# Patient Record
Sex: Female | Born: 1950 | Race: Black or African American | Hispanic: No | Marital: Single | Smoking: Never smoker
Health system: Southern US, Community
[De-identification: ages and names within clinical notes are randomized; demographics above are authoritative.]

## PROBLEM LIST (undated history)

## (undated) DIAGNOSIS — C50919 Malignant neoplasm of unspecified site of unspecified female breast: Secondary | ICD-10-CM

## (undated) DIAGNOSIS — K219 Gastro-esophageal reflux disease without esophagitis: Secondary | ICD-10-CM

## (undated) DIAGNOSIS — M25471 Effusion, right ankle: Secondary | ICD-10-CM

## (undated) DIAGNOSIS — R32 Unspecified urinary incontinence: Secondary | ICD-10-CM

## (undated) DIAGNOSIS — M549 Dorsalgia, unspecified: Secondary | ICD-10-CM

## (undated) DIAGNOSIS — I00 Rheumatic fever without heart involvement: Secondary | ICD-10-CM

## (undated) DIAGNOSIS — IMO0002 Reserved for concepts with insufficient information to code with codable children: Secondary | ICD-10-CM

## (undated) DIAGNOSIS — Z8711 Personal history of peptic ulcer disease: Secondary | ICD-10-CM

## (undated) DIAGNOSIS — K5792 Diverticulitis of intestine, part unspecified, without perforation or abscess without bleeding: Secondary | ICD-10-CM

## (undated) DIAGNOSIS — M25472 Effusion, left ankle: Secondary | ICD-10-CM

## (undated) DIAGNOSIS — N189 Chronic kidney disease, unspecified: Secondary | ICD-10-CM

## (undated) DIAGNOSIS — K259 Gastric ulcer, unspecified as acute or chronic, without hemorrhage or perforation: Secondary | ICD-10-CM

## (undated) DIAGNOSIS — N390 Urinary tract infection, site not specified: Secondary | ICD-10-CM

## (undated) DIAGNOSIS — IMO0001 Reserved for inherently not codable concepts without codable children: Secondary | ICD-10-CM

## (undated) DIAGNOSIS — R55 Syncope and collapse: Secondary | ICD-10-CM

## (undated) DIAGNOSIS — M1990 Unspecified osteoarthritis, unspecified site: Secondary | ICD-10-CM

## (undated) DIAGNOSIS — R011 Cardiac murmur, unspecified: Secondary | ICD-10-CM

## (undated) DIAGNOSIS — K759 Inflammatory liver disease, unspecified: Secondary | ICD-10-CM

## (undated) DIAGNOSIS — Z803 Family history of malignant neoplasm of breast: Secondary | ICD-10-CM

## (undated) DIAGNOSIS — K449 Diaphragmatic hernia without obstruction or gangrene: Secondary | ICD-10-CM

## (undated) DIAGNOSIS — Z923 Personal history of irradiation: Secondary | ICD-10-CM

## (undated) DIAGNOSIS — B019 Varicella without complication: Secondary | ICD-10-CM

## (undated) DIAGNOSIS — K2970 Gastritis, unspecified, without bleeding: Secondary | ICD-10-CM

## (undated) DIAGNOSIS — M069 Rheumatoid arthritis, unspecified: Secondary | ICD-10-CM

## (undated) DIAGNOSIS — I10 Essential (primary) hypertension: Secondary | ICD-10-CM

## (undated) DIAGNOSIS — Z5189 Encounter for other specified aftercare: Secondary | ICD-10-CM

## (undated) DIAGNOSIS — M329 Systemic lupus erythematosus, unspecified: Secondary | ICD-10-CM

## (undated) DIAGNOSIS — F32A Depression, unspecified: Secondary | ICD-10-CM

## (undated) DIAGNOSIS — H269 Unspecified cataract: Secondary | ICD-10-CM

## (undated) DIAGNOSIS — C189 Malignant neoplasm of colon, unspecified: Secondary | ICD-10-CM

## (undated) DIAGNOSIS — T7840XA Allergy, unspecified, initial encounter: Secondary | ICD-10-CM

## (undated) DIAGNOSIS — K829 Disease of gallbladder, unspecified: Secondary | ICD-10-CM

## (undated) DIAGNOSIS — F419 Anxiety disorder, unspecified: Secondary | ICD-10-CM

## (undated) DIAGNOSIS — M2550 Pain in unspecified joint: Secondary | ICD-10-CM

## (undated) DIAGNOSIS — R002 Palpitations: Secondary | ICD-10-CM

## (undated) DIAGNOSIS — E739 Lactose intolerance, unspecified: Secondary | ICD-10-CM

## (undated) DIAGNOSIS — N289 Disorder of kidney and ureter, unspecified: Secondary | ICD-10-CM

## (undated) HISTORY — DX: Encounter for other specified aftercare: Z51.89

## (undated) HISTORY — DX: Diverticulitis of intestine, part unspecified, without perforation or abscess without bleeding: K57.92

## (undated) HISTORY — DX: Depression, unspecified: F32.A

## (undated) HISTORY — DX: Palpitations: R00.2

## (undated) HISTORY — DX: Disease of gallbladder, unspecified: K82.9

## (undated) HISTORY — DX: Rheumatoid arthritis, unspecified: M06.9

## (undated) HISTORY — DX: Rheumatic fever without heart involvement: I00

## (undated) HISTORY — DX: Allergy, unspecified, initial encounter: T78.40XA

## (undated) HISTORY — DX: Systemic lupus erythematosus, unspecified: M32.9

## (undated) HISTORY — PX: JOINT REPLACEMENT: SHX530

## (undated) HISTORY — DX: Unspecified urinary incontinence: R32

## (undated) HISTORY — PX: APPENDECTOMY: SHX54

## (undated) HISTORY — PX: ABDOMINAL HYSTERECTOMY: SHX81

## (undated) HISTORY — PX: BREAST SURGERY: SHX581

## (undated) HISTORY — DX: Pain in unspecified joint: M25.50

## (undated) HISTORY — DX: Effusion, left ankle: M25.472

## (undated) HISTORY — PX: TONSILLECTOMY: SUR1361

## (undated) HISTORY — DX: Effusion, right ankle: M25.471

## (undated) HISTORY — DX: Urinary tract infection, site not specified: N39.0

## (undated) HISTORY — DX: Dorsalgia, unspecified: M54.9

## (undated) HISTORY — DX: Varicella without complication: B01.9

## (undated) HISTORY — DX: Lactose intolerance, unspecified: E73.9

## (undated) HISTORY — PX: CHOLECYSTECTOMY: SHX55

## (undated) HISTORY — PX: TUBAL LIGATION: SHX77

## (undated) HISTORY — DX: Family history of malignant neoplasm of breast: Z80.3

## (undated) HISTORY — PX: REPLACEMENT TOTAL KNEE: SUR1224

## (undated) HISTORY — PX: BREAST LUMPECTOMY: SHX2

## (undated) HISTORY — DX: Malignant neoplasm of colon, unspecified: C18.9

## (undated) HISTORY — DX: Anxiety disorder, unspecified: F41.9

## (undated) HISTORY — DX: Gastric ulcer, unspecified as acute or chronic, without hemorrhage or perforation: K25.9

## (undated) HISTORY — DX: Unspecified cataract: H26.9

## (undated) HISTORY — DX: Malignant neoplasm of unspecified site of unspecified female breast: C50.919

## (undated) HISTORY — DX: Personal history of peptic ulcer disease: Z87.11

---

## 1996-09-20 HISTORY — PX: BREAST EXCISIONAL BIOPSY: SUR124

## 1998-11-08 ENCOUNTER — Emergency Department (HOSPITAL_COMMUNITY): Admission: EM | Admit: 1998-11-08 | Discharge: 1998-11-08 | Payer: Self-pay | Admitting: Emergency Medicine

## 1999-02-05 ENCOUNTER — Encounter: Payer: Self-pay | Admitting: Emergency Medicine

## 1999-02-05 ENCOUNTER — Emergency Department (HOSPITAL_COMMUNITY): Admission: EM | Admit: 1999-02-05 | Discharge: 1999-02-05 | Payer: Self-pay | Admitting: Emergency Medicine

## 1999-05-19 ENCOUNTER — Emergency Department (HOSPITAL_COMMUNITY): Admission: EM | Admit: 1999-05-19 | Discharge: 1999-05-19 | Payer: Self-pay | Admitting: Emergency Medicine

## 1999-06-25 ENCOUNTER — Emergency Department (HOSPITAL_COMMUNITY): Admission: EM | Admit: 1999-06-25 | Discharge: 1999-06-25 | Payer: Self-pay | Admitting: Emergency Medicine

## 1999-06-25 ENCOUNTER — Encounter: Payer: Self-pay | Admitting: Emergency Medicine

## 1999-07-28 ENCOUNTER — Ambulatory Visit (HOSPITAL_COMMUNITY): Admission: RE | Admit: 1999-07-28 | Discharge: 1999-07-28 | Payer: Self-pay | Admitting: Occupational Medicine

## 1999-07-28 ENCOUNTER — Encounter: Payer: Self-pay | Admitting: Occupational Medicine

## 1999-08-11 ENCOUNTER — Encounter: Admission: RE | Admit: 1999-08-11 | Discharge: 1999-11-09 | Payer: Self-pay | Admitting: *Deleted

## 1999-08-27 ENCOUNTER — Inpatient Hospital Stay (HOSPITAL_COMMUNITY): Admission: AD | Admit: 1999-08-27 | Discharge: 1999-08-27 | Payer: Self-pay | Admitting: Obstetrics & Gynecology

## 2000-01-13 ENCOUNTER — Emergency Department (HOSPITAL_COMMUNITY): Admission: EM | Admit: 2000-01-13 | Discharge: 2000-01-13 | Payer: Self-pay | Admitting: Emergency Medicine

## 2000-02-16 ENCOUNTER — Encounter: Payer: Self-pay | Admitting: Internal Medicine

## 2000-02-16 ENCOUNTER — Emergency Department (HOSPITAL_COMMUNITY): Admission: EM | Admit: 2000-02-16 | Discharge: 2000-02-16 | Payer: Self-pay | Admitting: Internal Medicine

## 2000-07-19 ENCOUNTER — Encounter: Payer: Self-pay | Admitting: Emergency Medicine

## 2000-07-19 ENCOUNTER — Emergency Department (HOSPITAL_COMMUNITY): Admission: EM | Admit: 2000-07-19 | Discharge: 2000-07-19 | Payer: Self-pay | Admitting: Emergency Medicine

## 2000-09-01 ENCOUNTER — Ambulatory Visit (HOSPITAL_COMMUNITY): Admission: RE | Admit: 2000-09-01 | Discharge: 2000-09-01 | Payer: Self-pay | Admitting: Family Medicine

## 2000-09-01 ENCOUNTER — Encounter: Payer: Self-pay | Admitting: Family Medicine

## 2001-07-12 ENCOUNTER — Ambulatory Visit (HOSPITAL_COMMUNITY): Admission: RE | Admit: 2001-07-12 | Discharge: 2001-07-12 | Payer: Self-pay | Admitting: Internal Medicine

## 2001-07-12 ENCOUNTER — Encounter: Payer: Self-pay | Admitting: Internal Medicine

## 2001-09-20 DIAGNOSIS — C189 Malignant neoplasm of colon, unspecified: Secondary | ICD-10-CM

## 2001-09-20 HISTORY — DX: Malignant neoplasm of colon, unspecified: C18.9

## 2003-05-07 ENCOUNTER — Emergency Department (HOSPITAL_COMMUNITY): Admission: EM | Admit: 2003-05-07 | Discharge: 2003-05-07 | Payer: Self-pay | Admitting: Emergency Medicine

## 2003-05-07 ENCOUNTER — Encounter: Payer: Self-pay | Admitting: Emergency Medicine

## 2003-07-30 ENCOUNTER — Encounter: Admission: RE | Admit: 2003-07-30 | Discharge: 2003-10-28 | Payer: Self-pay | Admitting: Occupational Medicine

## 2003-09-16 ENCOUNTER — Ambulatory Visit (HOSPITAL_COMMUNITY): Admission: RE | Admit: 2003-09-16 | Discharge: 2003-09-16 | Payer: Self-pay | Admitting: Orthopedic Surgery

## 2003-09-16 ENCOUNTER — Ambulatory Visit (HOSPITAL_BASED_OUTPATIENT_CLINIC_OR_DEPARTMENT_OTHER): Admission: RE | Admit: 2003-09-16 | Discharge: 2003-09-16 | Payer: Self-pay | Admitting: Orthopedic Surgery

## 2003-11-13 ENCOUNTER — Other Ambulatory Visit: Admission: RE | Admit: 2003-11-13 | Discharge: 2003-11-13 | Payer: Self-pay | Admitting: Obstetrics and Gynecology

## 2003-12-01 ENCOUNTER — Emergency Department (HOSPITAL_COMMUNITY): Admission: EM | Admit: 2003-12-01 | Discharge: 2003-12-01 | Payer: Self-pay | Admitting: Emergency Medicine

## 2004-08-06 ENCOUNTER — Emergency Department (HOSPITAL_COMMUNITY): Admission: EM | Admit: 2004-08-06 | Discharge: 2004-08-06 | Payer: Self-pay | Admitting: Emergency Medicine

## 2004-08-08 ENCOUNTER — Emergency Department (HOSPITAL_COMMUNITY): Admission: EM | Admit: 2004-08-08 | Discharge: 2004-08-08 | Payer: Self-pay | Admitting: Emergency Medicine

## 2004-10-06 ENCOUNTER — Emergency Department (HOSPITAL_COMMUNITY): Admission: EM | Admit: 2004-10-06 | Discharge: 2004-10-06 | Payer: Self-pay | Admitting: Emergency Medicine

## 2004-11-05 ENCOUNTER — Emergency Department (HOSPITAL_COMMUNITY): Admission: EM | Admit: 2004-11-05 | Discharge: 2004-11-05 | Payer: Self-pay | Admitting: Emergency Medicine

## 2005-01-27 ENCOUNTER — Inpatient Hospital Stay (HOSPITAL_COMMUNITY): Admission: RE | Admit: 2005-01-27 | Discharge: 2005-02-01 | Payer: Self-pay | Admitting: Orthopedic Surgery

## 2005-10-29 ENCOUNTER — Inpatient Hospital Stay (HOSPITAL_COMMUNITY): Admission: AD | Admit: 2005-10-29 | Discharge: 2005-10-29 | Payer: Self-pay | Admitting: *Deleted

## 2005-11-25 ENCOUNTER — Encounter: Payer: Self-pay | Admitting: Family Medicine

## 2005-11-25 ENCOUNTER — Ambulatory Visit: Payer: Self-pay | Admitting: Family Medicine

## 2005-12-24 ENCOUNTER — Ambulatory Visit (HOSPITAL_COMMUNITY): Admission: RE | Admit: 2005-12-24 | Discharge: 2005-12-24 | Payer: Self-pay | Admitting: *Deleted

## 2007-03-04 ENCOUNTER — Emergency Department (HOSPITAL_COMMUNITY): Admission: EM | Admit: 2007-03-04 | Discharge: 2007-03-04 | Payer: Self-pay | Admitting: Family Medicine

## 2007-06-28 ENCOUNTER — Emergency Department (HOSPITAL_COMMUNITY): Admission: EM | Admit: 2007-06-28 | Discharge: 2007-06-28 | Payer: Self-pay | Admitting: Emergency Medicine

## 2007-09-21 DIAGNOSIS — C50919 Malignant neoplasm of unspecified site of unspecified female breast: Secondary | ICD-10-CM

## 2007-09-21 DIAGNOSIS — Z923 Personal history of irradiation: Secondary | ICD-10-CM

## 2007-09-21 HISTORY — DX: Malignant neoplasm of unspecified site of unspecified female breast: C50.919

## 2007-09-21 HISTORY — DX: Personal history of irradiation: Z92.3

## 2007-11-02 ENCOUNTER — Encounter (INDEPENDENT_AMBULATORY_CARE_PROVIDER_SITE_OTHER): Payer: Self-pay | Admitting: Nurse Practitioner

## 2007-11-03 ENCOUNTER — Ambulatory Visit: Payer: Self-pay | Admitting: Nurse Practitioner

## 2007-11-03 DIAGNOSIS — R109 Unspecified abdominal pain: Secondary | ICD-10-CM | POA: Insufficient documentation

## 2007-11-03 DIAGNOSIS — I10 Essential (primary) hypertension: Secondary | ICD-10-CM | POA: Insufficient documentation

## 2007-11-03 DIAGNOSIS — I00 Rheumatic fever without heart involvement: Secondary | ICD-10-CM | POA: Insufficient documentation

## 2007-11-03 DIAGNOSIS — M25559 Pain in unspecified hip: Secondary | ICD-10-CM | POA: Insufficient documentation

## 2007-11-03 DIAGNOSIS — M329 Systemic lupus erythematosus, unspecified: Secondary | ICD-10-CM | POA: Insufficient documentation

## 2007-11-03 HISTORY — DX: Unspecified abdominal pain: R10.9

## 2007-11-16 ENCOUNTER — Telehealth (INDEPENDENT_AMBULATORY_CARE_PROVIDER_SITE_OTHER): Payer: Self-pay | Admitting: Nurse Practitioner

## 2007-11-17 ENCOUNTER — Ambulatory Visit: Payer: Self-pay | Admitting: *Deleted

## 2007-11-24 ENCOUNTER — Ambulatory Visit: Payer: Self-pay | Admitting: Nurse Practitioner

## 2007-11-24 LAB — CONVERTED CEMR LAB
Bilirubin Urine: NEGATIVE
Chlamydia, DNA Probe: NEGATIVE
Cholesterol: 143 mg/dL (ref 0–200)
GC Probe Amp, Genital: NEGATIVE
Glucose, Urine, Semiquant: NEGATIVE
HDL: 55 mg/dL (ref >39)
Ketones, urine, test strip: NEGATIVE
LDL Cholesterol: 71 mg/dL (ref 0–99)
Nitrite: NEGATIVE
Pap Smear: NEGATIVE
Protein, U semiquant: NEGATIVE
Specific Gravity, Urine: 1.01
Total CHOL/HDL Ratio: 2.6
Triglycerides: 86 mg/dL (ref <150)
Urobilinogen, UA: 0.2
VLDL: 17 mg/dL (ref 0–40)
pH: 7

## 2007-11-29 ENCOUNTER — Telehealth (INDEPENDENT_AMBULATORY_CARE_PROVIDER_SITE_OTHER): Payer: Self-pay | Admitting: Nurse Practitioner

## 2007-11-29 ENCOUNTER — Ambulatory Visit (HOSPITAL_COMMUNITY): Admission: RE | Admit: 2007-11-29 | Discharge: 2007-11-29 | Payer: Self-pay | Admitting: Internal Medicine

## 2007-11-30 ENCOUNTER — Ambulatory Visit (HOSPITAL_COMMUNITY): Admission: RE | Admit: 2007-11-30 | Discharge: 2007-11-30 | Payer: Self-pay | Admitting: Internal Medicine

## 2007-12-01 ENCOUNTER — Encounter (INDEPENDENT_AMBULATORY_CARE_PROVIDER_SITE_OTHER): Payer: Self-pay | Admitting: Nurse Practitioner

## 2007-12-04 ENCOUNTER — Observation Stay (HOSPITAL_COMMUNITY): Admission: EM | Admit: 2007-12-04 | Discharge: 2007-12-05 | Payer: Self-pay | Admitting: Family Medicine

## 2007-12-07 ENCOUNTER — Encounter: Admission: RE | Admit: 2007-12-07 | Discharge: 2007-12-07 | Payer: Self-pay | Admitting: Internal Medicine

## 2007-12-14 ENCOUNTER — Ambulatory Visit: Payer: Self-pay | Admitting: Nurse Practitioner

## 2007-12-14 DIAGNOSIS — K5900 Constipation, unspecified: Secondary | ICD-10-CM | POA: Insufficient documentation

## 2007-12-14 DIAGNOSIS — K449 Diaphragmatic hernia without obstruction or gangrene: Secondary | ICD-10-CM | POA: Insufficient documentation

## 2007-12-14 DIAGNOSIS — K5790 Diverticulosis of intestine, part unspecified, without perforation or abscess without bleeding: Secondary | ICD-10-CM | POA: Insufficient documentation

## 2007-12-14 DIAGNOSIS — R920 Mammographic microcalcification found on diagnostic imaging of breast: Secondary | ICD-10-CM | POA: Insufficient documentation

## 2007-12-14 DIAGNOSIS — K5730 Diverticulosis of large intestine without perforation or abscess without bleeding: Secondary | ICD-10-CM | POA: Insufficient documentation

## 2007-12-14 LAB — CONVERTED CEMR LAB
Bilirubin Urine: NEGATIVE
Glucose, Urine, Semiquant: NEGATIVE
Ketones, urine, test strip: NEGATIVE
Nitrite: NEGATIVE
Protein, U semiquant: NEGATIVE
Specific Gravity, Urine: 1.02
Urobilinogen, UA: 0.2
pH: 6

## 2007-12-15 ENCOUNTER — Encounter (INDEPENDENT_AMBULATORY_CARE_PROVIDER_SITE_OTHER): Payer: Self-pay | Admitting: Nurse Practitioner

## 2008-01-08 ENCOUNTER — Encounter (INDEPENDENT_AMBULATORY_CARE_PROVIDER_SITE_OTHER): Payer: Self-pay | Admitting: Diagnostic Radiology

## 2008-01-08 ENCOUNTER — Encounter: Admission: RE | Admit: 2008-01-08 | Discharge: 2008-01-08 | Payer: Self-pay | Admitting: Internal Medicine

## 2008-01-11 ENCOUNTER — Ambulatory Visit: Payer: Self-pay | Admitting: Nurse Practitioner

## 2008-01-11 DIAGNOSIS — C50411 Malignant neoplasm of upper-outer quadrant of right female breast: Secondary | ICD-10-CM | POA: Insufficient documentation

## 2008-01-11 DIAGNOSIS — Z170 Estrogen receptor positive status [ER+]: Secondary | ICD-10-CM | POA: Insufficient documentation

## 2008-01-11 LAB — CONVERTED CEMR LAB
Bilirubin Urine: NEGATIVE
Glucose, Urine, Semiquant: NEGATIVE
Ketones, urine, test strip: NEGATIVE
Nitrite: NEGATIVE
Protein, U semiquant: NEGATIVE
Specific Gravity, Urine: 1.01
Urobilinogen, UA: 0.2
WBC Urine, dipstick: NEGATIVE
pH: 6

## 2008-01-12 ENCOUNTER — Encounter (INDEPENDENT_AMBULATORY_CARE_PROVIDER_SITE_OTHER): Payer: Self-pay | Admitting: Nurse Practitioner

## 2008-01-18 ENCOUNTER — Ambulatory Visit: Payer: Self-pay | Admitting: Nurse Practitioner

## 2008-01-19 ENCOUNTER — Encounter (INDEPENDENT_AMBULATORY_CARE_PROVIDER_SITE_OTHER): Payer: Self-pay | Admitting: Nurse Practitioner

## 2008-01-26 ENCOUNTER — Encounter (INDEPENDENT_AMBULATORY_CARE_PROVIDER_SITE_OTHER): Payer: Self-pay | Admitting: Nurse Practitioner

## 2008-01-26 ENCOUNTER — Encounter: Admission: RE | Admit: 2008-01-26 | Discharge: 2008-01-26 | Payer: Self-pay | Admitting: Internal Medicine

## 2008-01-26 ENCOUNTER — Ambulatory Visit: Payer: Self-pay | Admitting: Oncology

## 2008-02-16 ENCOUNTER — Encounter (INDEPENDENT_AMBULATORY_CARE_PROVIDER_SITE_OTHER): Payer: Self-pay | Admitting: Nurse Practitioner

## 2008-02-26 ENCOUNTER — Telehealth (INDEPENDENT_AMBULATORY_CARE_PROVIDER_SITE_OTHER): Payer: Self-pay | Admitting: Nurse Practitioner

## 2008-02-29 ENCOUNTER — Ambulatory Visit: Payer: Self-pay | Admitting: Nurse Practitioner

## 2008-02-29 LAB — CONVERTED CEMR LAB
Bilirubin Urine: NEGATIVE
Blood in Urine, dipstick: NEGATIVE
Glucose, Urine, Semiquant: NEGATIVE
Ketones, urine, test strip: NEGATIVE
Nitrite: NEGATIVE
Protein, U semiquant: NEGATIVE
Specific Gravity, Urine: 1.01
Urobilinogen, UA: 0.2
pH: 7

## 2008-03-06 ENCOUNTER — Telehealth (INDEPENDENT_AMBULATORY_CARE_PROVIDER_SITE_OTHER): Payer: Self-pay | Admitting: Nurse Practitioner

## 2008-03-20 ENCOUNTER — Encounter: Admission: RE | Admit: 2008-03-20 | Discharge: 2008-03-20 | Payer: Self-pay | Admitting: General Surgery

## 2008-03-20 ENCOUNTER — Ambulatory Visit (HOSPITAL_BASED_OUTPATIENT_CLINIC_OR_DEPARTMENT_OTHER): Admission: RE | Admit: 2008-03-20 | Discharge: 2008-03-20 | Payer: Self-pay | Admitting: General Surgery

## 2008-03-20 ENCOUNTER — Encounter (HOSPITAL_BASED_OUTPATIENT_CLINIC_OR_DEPARTMENT_OTHER): Payer: Self-pay | Admitting: General Surgery

## 2008-03-21 ENCOUNTER — Ambulatory Visit: Payer: Self-pay | Admitting: Nurse Practitioner

## 2008-03-22 ENCOUNTER — Encounter (INDEPENDENT_AMBULATORY_CARE_PROVIDER_SITE_OTHER): Payer: Self-pay | Admitting: Nurse Practitioner

## 2008-03-26 LAB — CONVERTED CEMR LAB
ANA Titer 1: 1:40 {titer} — ABNORMAL HIGH
Anti Nuclear Antibody(ANA): POSITIVE — AB
Basophils Absolute: 0 10*3/uL (ref 0.0–0.1)
Basophils Relative: 0 % (ref 0–1)
CRP: 0.7 mg/dL — ABNORMAL HIGH (ref <0.6)
Eosinophils Absolute: 0 10*3/uL (ref 0.0–0.7)
Eosinophils Relative: 0 % (ref 0–5)
HCT: 39.7 % (ref 36.0–46.0)
Hemoglobin: 12.6 g/dL (ref 12.0–15.0)
Lymphocytes Relative: 14 % (ref 12–46)
Lymphs Abs: 1.8 10*3/uL (ref 0.7–4.0)
MCHC: 31.7 g/dL (ref 30.0–36.0)
MCV: 94.5 fL (ref 78.0–100.0)
Monocytes Absolute: 1.3 10*3/uL — ABNORMAL HIGH (ref 0.1–1.0)
Monocytes Relative: 10 % (ref 3–12)
Neutro Abs: 10 10*3/uL — ABNORMAL HIGH (ref 1.7–7.7)
Neutrophils Relative %: 76 % (ref 43–77)
Platelets: 219 10*3/uL (ref 150–400)
RBC: 4.2 M/uL (ref 3.87–5.11)
RDW: 13.9 % (ref 11.5–15.5)
Rhuematoid fact SerPl-aCnc: <20 intl units/mL (ref 0–20)
Sed Rate: 12 mm/hr (ref 0–22)
WBC: 13.1 10*3/uL — ABNORMAL HIGH (ref 4.0–10.5)

## 2008-04-01 ENCOUNTER — Encounter (INDEPENDENT_AMBULATORY_CARE_PROVIDER_SITE_OTHER): Payer: Self-pay | Admitting: *Deleted

## 2008-04-05 ENCOUNTER — Ambulatory Visit: Payer: Self-pay | Admitting: Oncology

## 2008-04-11 ENCOUNTER — Encounter (INDEPENDENT_AMBULATORY_CARE_PROVIDER_SITE_OTHER): Payer: Self-pay | Admitting: Nurse Practitioner

## 2008-04-12 ENCOUNTER — Telehealth (INDEPENDENT_AMBULATORY_CARE_PROVIDER_SITE_OTHER): Payer: Self-pay | Admitting: Nurse Practitioner

## 2008-04-17 ENCOUNTER — Ambulatory Visit (HOSPITAL_BASED_OUTPATIENT_CLINIC_OR_DEPARTMENT_OTHER): Admission: RE | Admit: 2008-04-17 | Discharge: 2008-04-17 | Payer: Self-pay | Admitting: General Surgery

## 2008-04-17 ENCOUNTER — Encounter (HOSPITAL_BASED_OUTPATIENT_CLINIC_OR_DEPARTMENT_OTHER): Payer: Self-pay | Admitting: General Surgery

## 2008-04-24 ENCOUNTER — Encounter (INDEPENDENT_AMBULATORY_CARE_PROVIDER_SITE_OTHER): Payer: Self-pay | Admitting: Nurse Practitioner

## 2008-04-24 LAB — CBC WITH DIFFERENTIAL/PLATELET
BASO%: 0.7 % (ref 0.0–2.0)
Basophils Absolute: 0 10*3/uL (ref 0.0–0.1)
EOS%: 3.3 % (ref 0.0–7.0)
Eosinophils Absolute: 0.1 10*3/uL (ref 0.0–0.5)
HCT: 39.9 % (ref 34.8–46.6)
HGB: 13.6 g/dL (ref 11.6–15.9)
LYMPH%: 57.3 % — ABNORMAL HIGH (ref 14.0–48.0)
MCH: 31 pg (ref 26.0–34.0)
MCHC: 34.2 g/dL (ref 32.0–36.0)
MCV: 90.7 fL (ref 81.0–101.0)
MONO#: 0.2 10*3/uL (ref 0.1–0.9)
MONO%: 6 % (ref 0.0–13.0)
NEUT#: 1.2 10*3/uL — ABNORMAL LOW (ref 1.5–6.5)
NEUT%: 32.7 % — ABNORMAL LOW (ref 39.6–76.8)
Platelets: 202 10*3/uL (ref 145–400)
RBC: 4.4 10*6/uL (ref 3.70–5.32)
RDW: 13.6 % (ref 11.3–14.5)
WBC: 3.8 10*3/uL — ABNORMAL LOW (ref 3.9–10.0)
lymph#: 2.1 10*3/uL (ref 0.9–3.3)

## 2008-04-25 LAB — COMPREHENSIVE METABOLIC PANEL
ALT: 28 U/L (ref 0–35)
AST: 19 U/L (ref 0–37)
Albumin: 4 g/dL (ref 3.5–5.2)
Alkaline Phosphatase: 93 U/L (ref 39–117)
BUN: 8 mg/dL (ref 6–23)
CO2: 25 mEq/L (ref 19–32)
Calcium: 9.4 mg/dL (ref 8.4–10.5)
Chloride: 104 mEq/L (ref 96–112)
Creatinine, Ser: 0.8 mg/dL (ref 0.40–1.20)
Glucose, Bld: 117 mg/dL — ABNORMAL HIGH (ref 70–99)
Potassium: 3.8 mEq/L (ref 3.5–5.3)
Sodium: 138 mEq/L (ref 135–145)
Total Bilirubin: 0.4 mg/dL (ref 0.3–1.2)
Total Protein: 7 g/dL (ref 6.0–8.3)

## 2008-04-25 LAB — VITAMIN D 25 HYDROXY (VIT D DEFICIENCY, FRACTURES): Vit D, 25-Hydroxy: 33 ng/mL (ref 30–89)

## 2008-04-25 LAB — CANCER ANTIGEN 27.29: CA 27.29: 15 U/mL (ref 0–39)

## 2008-04-25 LAB — LACTATE DEHYDROGENASE: LDH: 161 U/L (ref 94–250)

## 2008-04-30 ENCOUNTER — Encounter: Admission: RE | Admit: 2008-04-30 | Discharge: 2008-04-30 | Payer: Self-pay | Admitting: Oncology

## 2008-05-01 ENCOUNTER — Ambulatory Visit (HOSPITAL_COMMUNITY): Admission: RE | Admit: 2008-05-01 | Discharge: 2008-05-01 | Payer: Self-pay | Admitting: Oncology

## 2008-05-07 ENCOUNTER — Ambulatory Visit: Admission: RE | Admit: 2008-05-07 | Discharge: 2008-08-05 | Payer: Self-pay | Admitting: Radiation Oncology

## 2008-05-08 ENCOUNTER — Encounter (INDEPENDENT_AMBULATORY_CARE_PROVIDER_SITE_OTHER): Payer: Self-pay | Admitting: Nurse Practitioner

## 2008-05-14 ENCOUNTER — Encounter: Admission: RE | Admit: 2008-05-14 | Discharge: 2008-05-14 | Payer: Self-pay | Admitting: Radiation Oncology

## 2008-05-14 ENCOUNTER — Encounter (INDEPENDENT_AMBULATORY_CARE_PROVIDER_SITE_OTHER): Payer: Self-pay | Admitting: Family Medicine

## 2008-05-23 ENCOUNTER — Encounter: Admission: RE | Admit: 2008-05-23 | Discharge: 2008-05-23 | Payer: Self-pay | Admitting: General Surgery

## 2008-05-23 ENCOUNTER — Encounter (INDEPENDENT_AMBULATORY_CARE_PROVIDER_SITE_OTHER): Payer: Self-pay | Admitting: Diagnostic Radiology

## 2008-06-17 LAB — URINALYSIS, MICROSCOPIC - CHCC
Bilirubin (Urine): NEGATIVE
Glucose: NEGATIVE g/dL
Ketones: NEGATIVE mg/dL
Nitrite: NEGATIVE
Protein: <30 mg/dL
Specific Gravity, Urine: 1.015 (ref 1.003–1.035)
pH: 6 (ref 4.6–8.0)

## 2008-06-19 LAB — URINALYSIS, MICROSCOPIC - CHCC
Bilirubin (Urine): NEGATIVE
Glucose: NEGATIVE g/dL
Ketones: NEGATIVE mg/dL
Nitrite: NEGATIVE
Protein: <30 mg/dL
Specific Gravity, Urine: 1.015 (ref 1.003–1.035)
pH: 6 (ref 4.6–8.0)

## 2008-06-19 LAB — URINE CULTURE

## 2008-06-21 LAB — URINE CULTURE

## 2008-07-26 ENCOUNTER — Encounter (INDEPENDENT_AMBULATORY_CARE_PROVIDER_SITE_OTHER): Payer: Self-pay | Admitting: Nurse Practitioner

## 2008-08-22 ENCOUNTER — Encounter (INDEPENDENT_AMBULATORY_CARE_PROVIDER_SITE_OTHER): Payer: Self-pay | Admitting: Nurse Practitioner

## 2008-09-10 ENCOUNTER — Ambulatory Visit: Payer: Self-pay | Admitting: Oncology

## 2008-09-16 ENCOUNTER — Encounter (INDEPENDENT_AMBULATORY_CARE_PROVIDER_SITE_OTHER): Payer: Self-pay | Admitting: Nurse Practitioner

## 2008-09-16 LAB — CBC WITH DIFFERENTIAL/PLATELET
BASO%: 0.5 % (ref 0.0–2.0)
Basophils Absolute: 0 10*3/uL (ref 0.0–0.1)
EOS%: 4.3 % (ref 0.0–7.0)
Eosinophils Absolute: 0.1 10*3/uL (ref 0.0–0.5)
HCT: 37.4 % (ref 34.8–46.6)
HGB: 12.9 g/dL (ref 11.6–15.9)
LYMPH%: 41.4 % (ref 14.0–48.0)
MCH: 30.9 pg (ref 26.0–34.0)
MCHC: 34.5 g/dL (ref 32.0–36.0)
MCV: 89.6 fL (ref 81.0–101.0)
MONO#: 0.4 10*3/uL (ref 0.1–0.9)
MONO%: 12.6 % (ref 0.0–13.0)
NEUT#: 1.4 10*3/uL — ABNORMAL LOW (ref 1.5–6.5)
NEUT%: 41.2 % (ref 39.6–76.8)
Platelets: 168 10*3/uL (ref 145–400)
RBC: 4.17 10*6/uL (ref 3.70–5.32)
RDW: 13.8 % (ref 11.3–14.5)
WBC: 3.4 10*3/uL — ABNORMAL LOW (ref 3.9–10.0)
lymph#: 1.4 10*3/uL (ref 0.9–3.3)

## 2008-09-16 LAB — COMPREHENSIVE METABOLIC PANEL
ALT: 19 U/L (ref 0–35)
AST: 24 U/L (ref 0–37)
Albumin: 4 g/dL (ref 3.5–5.2)
Alkaline Phosphatase: 88 U/L (ref 39–117)
BUN: 12 mg/dL (ref 6–23)
CO2: 24 mEq/L (ref 19–32)
Calcium: 9.1 mg/dL (ref 8.4–10.5)
Chloride: 107 mEq/L (ref 96–112)
Creatinine, Ser: 0.82 mg/dL (ref 0.40–1.20)
Glucose, Bld: 95 mg/dL (ref 70–99)
Potassium: 3.7 mEq/L (ref 3.5–5.3)
Sodium: 142 mEq/L (ref 135–145)
Total Bilirubin: 0.5 mg/dL (ref 0.3–1.2)
Total Protein: 7.1 g/dL (ref 6.0–8.3)

## 2008-09-16 LAB — CANCER ANTIGEN 27.29: CA 27.29: 11 U/mL (ref 0–39)

## 2008-09-16 LAB — FOLLICLE STIMULATING HORMONE: FSH: 105 m[IU]/mL

## 2008-09-16 LAB — LACTATE DEHYDROGENASE: LDH: 176 U/L (ref 94–250)

## 2008-09-25 ENCOUNTER — Encounter: Admission: RE | Admit: 2008-09-25 | Discharge: 2008-10-08 | Payer: Self-pay | Admitting: Orthopedic Surgery

## 2008-11-07 ENCOUNTER — Encounter: Admission: RE | Admit: 2008-11-07 | Discharge: 2008-11-07 | Payer: Self-pay | Admitting: Internal Medicine

## 2008-11-29 ENCOUNTER — Encounter: Admission: RE | Admit: 2008-11-29 | Discharge: 2008-11-29 | Payer: Self-pay | Admitting: General Surgery

## 2008-12-04 ENCOUNTER — Ambulatory Visit: Payer: Self-pay | Admitting: Oncology

## 2008-12-05 ENCOUNTER — Encounter (INDEPENDENT_AMBULATORY_CARE_PROVIDER_SITE_OTHER): Payer: Self-pay | Admitting: Nurse Practitioner

## 2008-12-05 LAB — CMP (CANCER CENTER ONLY)
ALT(SGPT): 29 U/L (ref 10–47)
AST: 30 U/L (ref 11–38)
Albumin: 3.4 g/dL (ref 3.3–5.5)
Alkaline Phosphatase: 96 U/L — ABNORMAL HIGH (ref 26–84)
BUN, Bld: 13 mg/dL (ref 7–22)
CO2: 30 mEq/L (ref 18–33)
Calcium: 9.6 mg/dL (ref 8.0–10.3)
Chloride: 97 mEq/L — ABNORMAL LOW (ref 98–108)
Creat: 0.8 mg/dl (ref 0.6–1.2)
Glucose, Bld: 94 mg/dL (ref 73–118)
Potassium: 4 mEq/L (ref 3.3–4.7)
Sodium: 138 mEq/L (ref 128–145)
Total Bilirubin: 0.5 mg/dl (ref 0.20–1.60)
Total Protein: 7.8 g/dL (ref 6.4–8.1)

## 2008-12-05 LAB — CBC WITH DIFFERENTIAL (CANCER CENTER ONLY)
HCT: 39.6 % (ref 34.8–46.6)
HGB: 13.3 g/dL (ref 11.6–15.9)
MCH: 30.2 pg (ref 26.0–34.0)
MCHC: 33.6 g/dL (ref 32.0–36.0)
MCV: 90 fL (ref 81–101)
Platelets: 203 10*3/uL (ref 145–400)
RBC: 4.41 10*6/uL (ref 3.70–5.32)
RDW: 12.3 % (ref 10.5–14.6)
WBC: 3.4 10*3/uL — ABNORMAL LOW (ref 3.9–10.0)

## 2008-12-05 LAB — MANUAL DIFFERENTIAL (CHCC SATELLITE)
ALC: 2.1 10*3/uL (ref 0.6–2.2)
ANC (CHCC HP manual diff): 1.1 10*3/uL — ABNORMAL LOW (ref 1.5–6.7)
Eos: 1 % (ref 0–7)
LYMPH: 46 % (ref 14–48)
MONO: 5 % (ref 0–13)
PLT EST ~~LOC~~: ADEQUATE
SEG: 31 % — ABNORMAL LOW (ref 40–75)
Variant Lymph: 17 % — ABNORMAL HIGH (ref 0–0)

## 2008-12-06 LAB — VITAMIN D 25 HYDROXY (VIT D DEFICIENCY, FRACTURES): Vit D, 25-Hydroxy: 27 ng/mL — ABNORMAL LOW (ref 30–89)

## 2009-01-29 ENCOUNTER — Ambulatory Visit: Payer: Self-pay | Admitting: Oncology

## 2009-02-10 ENCOUNTER — Emergency Department (HOSPITAL_COMMUNITY): Admission: EM | Admit: 2009-02-10 | Discharge: 2009-02-10 | Payer: Self-pay | Admitting: Emergency Medicine

## 2009-03-17 ENCOUNTER — Ambulatory Visit: Payer: Self-pay | Admitting: Oncology

## 2009-03-19 ENCOUNTER — Encounter (INDEPENDENT_AMBULATORY_CARE_PROVIDER_SITE_OTHER): Payer: Self-pay | Admitting: Nurse Practitioner

## 2009-03-19 LAB — CMP (CANCER CENTER ONLY)
ALT(SGPT): 29 U/L (ref 10–47)
AST: 30 U/L (ref 11–38)
Albumin: 3.2 g/dL — ABNORMAL LOW (ref 3.3–5.5)
Alkaline Phosphatase: 84 U/L (ref 26–84)
BUN, Bld: 12 mg/dL (ref 7–22)
CO2: 28 mEq/L (ref 18–33)
Calcium: 9.7 mg/dL (ref 8.0–10.3)
Chloride: 101 mEq/L (ref 98–108)
Creat: 0.9 mg/dl (ref 0.6–1.2)
Glucose, Bld: 93 mg/dL (ref 73–118)
Potassium: 3.9 mEq/L (ref 3.3–4.7)
Sodium: 140 mEq/L (ref 128–145)
Total Bilirubin: 0.4 mg/dl (ref 0.20–1.60)
Total Protein: 7.5 g/dL (ref 6.4–8.1)

## 2009-03-19 LAB — CBC WITH DIFFERENTIAL (CANCER CENTER ONLY)
BASO#: 0 10*3/uL (ref 0.0–0.2)
BASO%: 0.7 % (ref 0.0–2.0)
EOS%: 1.6 % (ref 0.0–7.0)
Eosinophils Absolute: 0.1 10*3/uL (ref 0.0–0.5)
HCT: 36.6 % (ref 34.8–46.6)
HGB: 12.8 g/dL (ref 11.6–15.9)
LYMPH#: 2.6 10*3/uL (ref 0.9–3.3)
LYMPH%: 55.5 % — ABNORMAL HIGH (ref 14.0–48.0)
MCH: 30.7 pg (ref 26.0–34.0)
MCHC: 35 g/dL (ref 32.0–36.0)
MCV: 88 fL (ref 81–101)
MONO#: 0.5 10*3/uL (ref 0.1–0.9)
MONO%: 9.9 % (ref 0.0–13.0)
NEUT#: 1.5 10*3/uL (ref 1.5–6.5)
NEUT%: 32.3 % — ABNORMAL LOW (ref 39.6–80.0)
Platelets: 207 10*3/uL (ref 145–400)
RBC: 4.17 10*6/uL (ref 3.70–5.32)
RDW: 13.3 % (ref 10.5–14.6)
WBC: 4.7 10*3/uL (ref 3.9–10.0)

## 2009-03-28 ENCOUNTER — Ambulatory Visit: Payer: Self-pay | Admitting: Nurse Practitioner

## 2009-04-22 ENCOUNTER — Ambulatory Visit: Payer: Self-pay | Admitting: Internal Medicine

## 2009-04-22 DIAGNOSIS — R300 Dysuria: Secondary | ICD-10-CM | POA: Insufficient documentation

## 2009-04-22 DIAGNOSIS — M2550 Pain in unspecified joint: Secondary | ICD-10-CM | POA: Insufficient documentation

## 2009-04-22 LAB — CONVERTED CEMR LAB
ANA Titer 1: NEGATIVE
Anti Nuclear Antibody(ANA): POSITIVE — AB
Bilirubin Urine: NEGATIVE
Blood in Urine, dipstick: NEGATIVE
CRP: 0.4 mg/dL (ref <0.6)
Glucose, Urine, Semiquant: NEGATIVE
Ketones, urine, test strip: NEGATIVE
Nitrite: NEGATIVE
Protein, U semiquant: NEGATIVE
Specific Gravity, Urine: <1.005
Total CK: 146 units/L (ref 7–177)
Urobilinogen, UA: 0.2
WBC Urine, dipstick: NEGATIVE
pH: 7

## 2009-04-23 ENCOUNTER — Encounter (INDEPENDENT_AMBULATORY_CARE_PROVIDER_SITE_OTHER): Payer: Self-pay | Admitting: Internal Medicine

## 2009-05-12 ENCOUNTER — Encounter (INDEPENDENT_AMBULATORY_CARE_PROVIDER_SITE_OTHER): Payer: Self-pay | Admitting: Internal Medicine

## 2009-06-27 ENCOUNTER — Ambulatory Visit: Payer: Self-pay | Admitting: Nurse Practitioner

## 2009-07-17 ENCOUNTER — Ambulatory Visit: Payer: Self-pay | Admitting: Oncology

## 2009-07-22 ENCOUNTER — Encounter (INDEPENDENT_AMBULATORY_CARE_PROVIDER_SITE_OTHER): Payer: Self-pay | Admitting: Nurse Practitioner

## 2009-07-29 ENCOUNTER — Encounter (INDEPENDENT_AMBULATORY_CARE_PROVIDER_SITE_OTHER): Payer: Self-pay | Admitting: Nurse Practitioner

## 2009-08-01 ENCOUNTER — Encounter (INDEPENDENT_AMBULATORY_CARE_PROVIDER_SITE_OTHER): Payer: Self-pay | Admitting: Nurse Practitioner

## 2009-08-25 ENCOUNTER — Telehealth (INDEPENDENT_AMBULATORY_CARE_PROVIDER_SITE_OTHER): Payer: Self-pay | Admitting: Nurse Practitioner

## 2009-09-04 ENCOUNTER — Ambulatory Visit: Payer: Self-pay | Admitting: Nurse Practitioner

## 2009-09-04 DIAGNOSIS — R05 Cough: Secondary | ICD-10-CM | POA: Insufficient documentation

## 2009-09-04 DIAGNOSIS — G589 Mononeuropathy, unspecified: Secondary | ICD-10-CM | POA: Insufficient documentation

## 2009-09-04 DIAGNOSIS — R059 Cough, unspecified: Secondary | ICD-10-CM | POA: Insufficient documentation

## 2009-09-04 DIAGNOSIS — R5381 Other malaise: Secondary | ICD-10-CM | POA: Insufficient documentation

## 2009-09-04 DIAGNOSIS — R5383 Other fatigue: Secondary | ICD-10-CM | POA: Insufficient documentation

## 2009-09-04 LAB — CONVERTED CEMR LAB
Bilirubin Urine: NEGATIVE
Blood in Urine, dipstick: NEGATIVE
Glucose, Urine, Semiquant: NEGATIVE
HCT: 39.3 % (ref 36.0–46.0)
Hemoglobin: 13.2 g/dL (ref 12.0–15.0)
MCHC: 33.6 g/dL (ref 30.0–36.0)
MCV: 90.3 fL (ref 78.0–100.0)
Nitrite: NEGATIVE
Platelets: 244 10*3/uL (ref 150–400)
Protein, U semiquant: 30
RBC: 4.35 M/uL (ref 3.87–5.11)
RDW: 12.7 % (ref 11.5–15.5)
Retic Ct Pct: 1.9 % (ref 0.4–3.1)
Specific Gravity, Urine: 1.01
Urobilinogen, UA: 1
WBC Urine, dipstick: NEGATIVE
WBC: 5.9 10*3/uL (ref 4.0–10.5)
pH: 7

## 2009-09-10 ENCOUNTER — Encounter (INDEPENDENT_AMBULATORY_CARE_PROVIDER_SITE_OTHER): Payer: Self-pay | Admitting: Nurse Practitioner

## 2009-09-10 ENCOUNTER — Telehealth (INDEPENDENT_AMBULATORY_CARE_PROVIDER_SITE_OTHER): Payer: Self-pay | Admitting: Nurse Practitioner

## 2009-09-24 ENCOUNTER — Encounter (INDEPENDENT_AMBULATORY_CARE_PROVIDER_SITE_OTHER): Payer: Self-pay | Admitting: Nurse Practitioner

## 2009-10-17 ENCOUNTER — Ambulatory Visit: Payer: Self-pay | Admitting: Oncology

## 2009-10-21 ENCOUNTER — Encounter (INDEPENDENT_AMBULATORY_CARE_PROVIDER_SITE_OTHER): Payer: Self-pay | Admitting: Nurse Practitioner

## 2009-10-21 LAB — CBC WITH DIFFERENTIAL (CANCER CENTER ONLY)
BASO#: 0 10*3/uL (ref 0.0–0.2)
BASO%: 0.7 % (ref 0.0–2.0)
EOS%: 2.7 % (ref 0.0–7.0)
Eosinophils Absolute: 0.1 10*3/uL (ref 0.0–0.5)
HCT: 37.2 % (ref 34.8–46.6)
HGB: 12.4 g/dL (ref 11.6–15.9)
LYMPH#: 1.9 10*3/uL (ref 0.9–3.3)
LYMPH%: 53.3 % — ABNORMAL HIGH (ref 14.0–48.0)
MCH: 30.6 pg (ref 26.0–34.0)
MCHC: 33.4 g/dL (ref 32.0–36.0)
MCV: 92 fL (ref 81–101)
MONO#: 0.4 10*3/uL (ref 0.1–0.9)
MONO%: 9.5 % (ref 0.0–13.0)
NEUT#: 1.2 10*3/uL — ABNORMAL LOW (ref 1.5–6.5)
NEUT%: 33.8 % — ABNORMAL LOW (ref 39.6–80.0)
Platelets: 183 10*3/uL (ref 145–400)
RBC: 4.05 10*6/uL (ref 3.70–5.32)
RDW: 12 % (ref 10.5–14.6)
WBC: 3.6 10*3/uL — ABNORMAL LOW (ref 3.9–10.0)

## 2009-10-21 LAB — CMP (CANCER CENTER ONLY)
ALT(SGPT): 28 U/L (ref 10–47)
AST: 33 U/L (ref 11–38)
Albumin: 3.6 g/dL (ref 3.3–5.5)
Alkaline Phosphatase: 68 U/L (ref 26–84)
BUN, Bld: 16 mg/dL (ref 7–22)
CO2: 30 mEq/L (ref 18–33)
Calcium: 9.7 mg/dL (ref 8.0–10.3)
Chloride: 99 mEq/L (ref 98–108)
Creat: 0.9 mg/dl (ref 0.6–1.2)
Glucose, Bld: 101 mg/dL (ref 73–118)
Potassium: 3.4 mEq/L (ref 3.3–4.7)
Sodium: 138 mEq/L (ref 128–145)
Total Bilirubin: 0.5 mg/dl (ref 0.20–1.60)
Total Protein: 7.5 g/dL (ref 6.4–8.1)

## 2009-10-21 LAB — CANCER ANTIGEN 27.29: CA 27.29: 9 U/mL (ref 0–39)

## 2009-12-01 ENCOUNTER — Encounter: Admission: RE | Admit: 2009-12-01 | Discharge: 2009-12-01 | Payer: Self-pay | Admitting: Internal Medicine

## 2009-12-03 ENCOUNTER — Ambulatory Visit: Payer: Self-pay | Admitting: Nurse Practitioner

## 2009-12-03 DIAGNOSIS — H6120 Impacted cerumen, unspecified ear: Secondary | ICD-10-CM | POA: Insufficient documentation

## 2009-12-24 ENCOUNTER — Ambulatory Visit: Payer: Self-pay | Admitting: Nurse Practitioner

## 2009-12-24 DIAGNOSIS — E669 Obesity, unspecified: Secondary | ICD-10-CM | POA: Insufficient documentation

## 2009-12-24 LAB — CONVERTED CEMR LAB
Cholesterol, target level: 200 mg/dL
HDL goal, serum: 40 mg/dL
LDL Goal: 130 mg/dL

## 2010-01-22 ENCOUNTER — Ambulatory Visit: Payer: Self-pay | Admitting: Nurse Practitioner

## 2010-01-22 DIAGNOSIS — H6690 Otitis media, unspecified, unspecified ear: Secondary | ICD-10-CM | POA: Insufficient documentation

## 2010-01-22 LAB — CONVERTED CEMR LAB
Bilirubin Urine: NEGATIVE
Blood in Urine, dipstick: NEGATIVE
Glucose, Urine, Semiquant: NEGATIVE
Ketones, urine, test strip: NEGATIVE
Nitrite: NEGATIVE
Protein, U semiquant: NEGATIVE
Specific Gravity, Urine: 1.01
Urobilinogen, UA: 0.2
WBC Urine, dipstick: NEGATIVE
pH: 7

## 2010-02-13 ENCOUNTER — Ambulatory Visit: Payer: Self-pay | Admitting: Oncology

## 2010-03-12 ENCOUNTER — Telehealth (INDEPENDENT_AMBULATORY_CARE_PROVIDER_SITE_OTHER): Payer: Self-pay | Admitting: Nurse Practitioner

## 2010-03-12 ENCOUNTER — Ambulatory Visit: Payer: Self-pay | Admitting: Oncology

## 2010-03-13 ENCOUNTER — Encounter (INDEPENDENT_AMBULATORY_CARE_PROVIDER_SITE_OTHER): Payer: Self-pay | Admitting: Nurse Practitioner

## 2010-03-13 LAB — CBC WITH DIFFERENTIAL (CANCER CENTER ONLY)
BASO#: 0 10*3/uL (ref 0.0–0.2)
BASO%: 0.8 % (ref 0.0–2.0)
EOS%: 3.8 % (ref 0.0–7.0)
Eosinophils Absolute: 0.2 10*3/uL (ref 0.0–0.5)
HCT: 36.9 % (ref 34.8–46.6)
HGB: 12.5 g/dL (ref 11.6–15.9)
LYMPH#: 1.9 10*3/uL (ref 0.9–3.3)
LYMPH%: 49.8 % — ABNORMAL HIGH (ref 14.0–48.0)
MCH: 31.2 pg (ref 26.0–34.0)
MCHC: 34 g/dL (ref 32.0–36.0)
MCV: 92 fL (ref 81–101)
MONO#: 0.5 10*3/uL (ref 0.1–0.9)
MONO%: 11.9 % (ref 0.0–13.0)
NEUT#: 1.3 10*3/uL — ABNORMAL LOW (ref 1.5–6.5)
NEUT%: 33.7 % — ABNORMAL LOW (ref 39.6–80.0)
Platelets: 197 10*3/uL (ref 145–400)
RBC: 4.01 10*6/uL (ref 3.70–5.32)
RDW: 12.9 % (ref 10.5–14.6)
WBC: 3.9 10*3/uL (ref 3.9–10.0)

## 2010-03-13 LAB — CMP (CANCER CENTER ONLY)
ALT(SGPT): 32 U/L (ref 10–47)
AST: 31 U/L (ref 11–38)
Albumin: 3.8 g/dL (ref 3.3–5.5)
Alkaline Phosphatase: 65 U/L (ref 26–84)
BUN, Bld: 14 mg/dL (ref 7–22)
CO2: 30 mEq/L (ref 18–33)
Calcium: 9.7 mg/dL (ref 8.0–10.3)
Chloride: 101 mEq/L (ref 98–108)
Creat: 0.8 mg/dl (ref 0.6–1.2)
Glucose, Bld: 77 mg/dL (ref 73–118)
Potassium: 3.9 mEq/L (ref 3.3–4.7)
Sodium: 136 mEq/L (ref 128–145)
Total Bilirubin: 0.5 mg/dl (ref 0.20–1.60)
Total Protein: 7.1 g/dL (ref 6.4–8.1)

## 2010-03-13 LAB — CANCER ANTIGEN 27.29: CA 27.29: 16 U/mL (ref 0–39)

## 2010-03-20 ENCOUNTER — Encounter (INDEPENDENT_AMBULATORY_CARE_PROVIDER_SITE_OTHER): Payer: Self-pay | Admitting: Nurse Practitioner

## 2010-04-19 ENCOUNTER — Emergency Department (HOSPITAL_COMMUNITY): Admission: EM | Admit: 2010-04-19 | Discharge: 2010-04-19 | Payer: Self-pay | Admitting: Emergency Medicine

## 2010-06-28 ENCOUNTER — Emergency Department (HOSPITAL_COMMUNITY): Admission: EM | Admit: 2010-06-28 | Discharge: 2010-06-28 | Payer: Self-pay | Admitting: Emergency Medicine

## 2010-07-01 ENCOUNTER — Telehealth (INDEPENDENT_AMBULATORY_CARE_PROVIDER_SITE_OTHER): Payer: Self-pay | Admitting: Nurse Practitioner

## 2010-09-10 ENCOUNTER — Ambulatory Visit: Payer: Self-pay | Admitting: Oncology

## 2010-10-11 ENCOUNTER — Encounter: Payer: Self-pay | Admitting: Family Medicine

## 2010-10-11 ENCOUNTER — Encounter: Payer: Self-pay | Admitting: Internal Medicine

## 2010-10-18 LAB — CONVERTED CEMR LAB
ALT: 21 units/L (ref 0–35)
AST: 27 units/L (ref 0–37)
Albumin: 4.4 g/dL (ref 3.5–5.2)
Alkaline Phosphatase: 88 units/L (ref 39–117)
BUN: 12 mg/dL (ref 6–23)
BUN: 8 mg/dL
Basophils Absolute: 0 10*3/uL (ref 0.0–0.1)
Basophils Relative: 0 % (ref 0–1)
CO2: 22 meq/L (ref 19–32)
CO2: 27 meq/L
Calcium: 9.4 mg/dL (ref 8.4–10.5)
Calcium: 9.8 mg/dL
Chloride: 104 meq/L (ref 96–112)
Chloride: 108 meq/L
Creatinine, Ser: 0.69 mg/dL (ref 0.40–1.20)
Creatinine, Ser: 0.7 mg/dL
Eosinophils Absolute: 0.1 10*3/uL (ref 0.0–0.7)
Eosinophils Relative: 2 % (ref 0–5)
Glucose, Bld: 89 mg/dL
Glucose, Bld: 89 mg/dL (ref 70–99)
HCT: 40.8 % (ref 36.0–46.0)
Hemoglobin: 13.7 g/dL (ref 12.0–15.0)
Lymphocytes Relative: 55 % — ABNORMAL HIGH (ref 12–46)
Lymphs Abs: 3 10*3/uL (ref 0.7–4.0)
MCHC: 33.6 g/dL (ref 30.0–36.0)
MCV: 90.7 fL (ref 78.0–100.0)
Monocytes Absolute: 0.5 10*3/uL (ref 0.1–1.0)
Monocytes Relative: 9 % (ref 3–12)
Neutro Abs: 1.8 10*3/uL (ref 1.7–7.7)
Neutrophils Relative %: 34 % — ABNORMAL LOW (ref 43–77)
Platelets: 259 10*3/uL (ref 150–400)
Potassium: 3.6 meq/L (ref 3.5–5.3)
Potassium: 4.4 meq/L
RBC: 4.5 M/uL (ref 3.87–5.11)
RDW: 13.7 % (ref 11.5–15.5)
Sodium: 138 meq/L
Sodium: 141 meq/L (ref 135–145)
TSH: 0.936 microintl units/mL (ref 0.350–5.50)
Total Bilirubin: 0.3 mg/dL (ref 0.3–1.2)
Total Protein: 7.9 g/dL (ref 6.0–8.3)
WBC: 5.4 10*3/uL (ref 4.0–10.5)

## 2010-10-20 NOTE — Letter (Signed)
Summary: REPORT FROM  DR.PATRICK BALLEN  REPORT FROM  DR.PATRICK BALLEN   Imported By: Arta Bruce 10/11/2008 16:11:07  _____________________________________________________________________  External Attachment:    Type:   Image     Comment:   External Document

## 2010-10-20 NOTE — Letter (Signed)
Summary: Handout Printed  Printed Handout:  - Insomnia 

## 2010-10-20 NOTE — Assessment & Plan Note (Signed)
Summary: HTN/Abdominal Pain   Vital Signs:  Patient profile:   60 year old female Menstrual status:  partial hysterectomy Weight:      200.9 pounds BMI:     33.04 BSA:     1.99 Temp:     98.0 degrees F oral Pulse rate:   69 / minute Pulse rhythm:   regular Resp:     16 per minute BP sitting:   149 / 89  (left arm) Cuff size:   large  Vitals Entered By: Levon Hedger (December 03, 2009 10:58 AM) CC: follow-up visit 3 month , Abdominal Pain, Hypertension Management Pain Assessment Patient in pain? yes     Location: stomach  Does patient need assistance? Functional Status Self care Ambulation Normal LMP - Character: partial hysterectomy     Menstrual Status partial hysterectomy Last PAP Result negative   CC:  follow-up visit 3 month , Abdominal Pain, and Hypertension Management.  History of Present Illness:  Pt into office for high blood pressure  1. Right ear - ? if it is wax buildup.  She has taken some of the wax out using a q-tip. Decreased hearing in left ear especially when laying on the left side  2.  Colonscopy - pt still needs a colonscopy. Last one done in 1998 Dx with diverticulosis several years ago in 1998 with colonscopy.  She has since that time avoided foods with seeds, nuts.    3.  Stomach problems - Aching stomach pain, sometimes it is accompanied by gas. She started taking the nexium at night to see if symptoms would improve. Eating makes the symptoms worse - eat because she needs to eat not because she likes it. Noticed that pain is now all the time and is over entire stomach not just localized  Dyspepsia History:      There is a prior history of GERD.  The patient does not have a prior history of documented ulcer disease.  The dominant symptom is heartburn or acid reflux.  An H-2 blocker medication is not currently being taken.  She has no history of a positive H. Pylori serology.  No previous upper endoscopy has been done.    Hypertension  History:      She denies headache, chest pain, and palpitations.  She notes no problems with any antihypertensive medication side effects.        Positive major cardiovascular risk factors include female age 13 years old or older and hypertension.  Negative major cardiovascular risk factors include non-tobacco-user status.        Further assessment for target organ damage reveals no history of ASHD, cardiac end-organ damage (CHF/LVH), stroke/TIA, peripheral vascular disease, renal insufficiency, or hypertensive retinopathy.     Allergies: 1)  ! Codeine 2)  ! Tetracycline 3)  ! Iodine 4)  ! Penicillin 5)  ! * Milk Products 6)  ! * Latex  Review of Systems General:  Denies fever. ENT:  Complains of decreased hearing; right. CV:  Denies chest pain or discomfort. Resp:  Denies cough. GI:  Complains of abdominal pain, constipation, and gas; denies bloody stools, diarrhea, nausea, and vomiting. Neuro:  Denies headaches.  Physical Exam  General:  alert.   Head:  normocephalic.   Ears:  right - cerumen impaction  left - TM visible Mouth:  pharynx pink and moist.   Lungs:  normal breath sounds.   Heart:  normal rate and regular rhythm.   Abdomen:  normal bowel sounds.   slight tenderness  with palpation no guarding Msk:  up to the exam Neurologic:  alert & oriented X3.   Skin:  color normal.   Psych:  Oriented X3.     Impression & Recommendations:  Problem # 1:  CERUMEN IMPACTION, RIGHT (ICD-380.4) ear drops given  pt given instructions to start 2 days before next appt - 2 drops two times a day in right ear  Problem # 2:  HYPERTENSION, BENIGN ESSENTIAL (ICD-401.1) Elevated today DASH diet Her updated medication list for this problem includes:    Toprol Xl 50 Mg Tb24 (Metoprolol succinate) ..... One tablet by mouth daily for blood pressure    Hydrochlorothiazide 25 Mg Tabs (Hydrochlorothiazide) ..... One tablet by mouth daily  Problem # 3:  ABDOMINAL PAIN  (ICD-789.00) advised pt to start miralax get x-ray if pain persists 1 week after miralax Orders: Radiology other (Radiology Other)  Complete Medication List: 1)  Toprol Xl 50 Mg Tb24 (Metoprolol succinate) .... One tablet by mouth daily for blood pressure 2)  Nexium 40 Mg Cpdr (Esomeprazole magnesium) .... One tablet by mouth daily 3)  Tamoxifen Citrate 20 Mg Tabs (Tamoxifen citrate) .... One tablet by mouth daily 4)  Hydrochlorothiazide 25 Mg Tabs (Hydrochlorothiazide) .... One tablet by mouth daily 5)  Multivitamins Tabs (Multiple vitamin) .... One tablet by mouth daily 6)  Fish Oil 300 Mg Caps (Omega-3 fatty acids) .... One capsule by mouth daily 7)  Meloxicam 15 Mg Tabs (Meloxicam) .... One tablet by mouth daily for pain 8)  Miralax Powd (Polyethylene glycol 3350) .... Mix 1 capful with 8 ounces of water/juice daily  Hypertension Assessment/Plan:      The patient's hypertensive risk group is category B: At least one risk factor (excluding diabetes) with no target organ damage.  Her calculated 10 year risk of coronary heart disease is 13 %.  Today's blood pressure is 149/89.  Her blood pressure goal is < 140/90.   Patient Instructions: 1)  Colonscopy  - Will wait to see if you will be added to the list for colonscopy. 2)  Abdominal pain:  3)  Take the miralax mixed with 1 glass of water or juice daily.   4)  If pain still persists 1 week after starting miralax then you should get abdominal films. 5)  Follow up in 3 weeks for abdominal pain. Prescriptions: MIRALAX  POWD (POLYETHYLENE GLYCOL 3350) Mix 1 capful with 8 ounces of water/juice daily  #255 x 1   Entered and Authorized by:   Lehman Prom FNP   Signed by:   Lehman Prom FNP on 12/03/2009   Method used:   Faxed to ...       [facility] (retail)       [address] [place], [place]  [address]       Ph: [phone]       Fax: [phone]   RxID:   [id number]

## 2010-10-20 NOTE — Letter (Signed)
[facility]   Imported By: Arta Bruce 07/30/2008 10:01:32  _____________________________________________________________________  External Attachment:    Type:   Image     Comment:   External Document

## 2010-10-20 NOTE — Letter (Signed)
Summary: [facility]OFFICE NOTE  [facility]OFF[facility]   Imported By: Arta Bruce 12/29/2009 13:01:16  _____________________________________________________________________  External Attachment:    Type:   Image     Comment:   External Document

## 2010-10-20 NOTE — Letter (Signed)
Summary: RHEUMATOLOGY REFERRAL APPT 09-24-09  RHEUMATOLOGY REFERRAL APPT 09-24-09   Imported By: Cheryll Dessert 09/02/2009 16:45:37  _____________________________________________________________________  External Attachment:    Type:   Image     Comment:   External Document

## 2010-10-20 NOTE — Assessment & Plan Note (Signed)
Summary: NEW - HTN/Abd pain   Vital Signs:  Patient Profile:   60 Years Old Female Height:     65.50 inches Weight:      205 pounds BMI:     33.72 BSA:     2.01 Temp:     99.080 degrees F oral Pulse rate:   80 / minute Pulse rhythm:   regular Resp:     20 per minute BP sitting:   160 / 100  (left arm) Cuff size:   regular  Pt. in pain?   yes    Location:   side    Intensity:   7    Type:       hurting  Vitals Entered By: Levon Hedger (November 03, 2007 2:58 PM)  Menstrual History: LMP - Character: partial hysterectomy              Is Patient Diabetic? No  Does patient need assistance? Ambulation Normal   Last PAP Date 2007   Chief Complaint:  new establish/ pain on right side and pressure in chest.  History of Present Illness:  Pt into the office to re-establish care. Pt has not been here in a long time. PMH entered  Chest pressure - notes that when she lays at night she feels like someone is sitting on her.  some left arm discomfort but no radiation.  Unable to lie flat to sleep.  no shortness of breath.  no bp meds and multiple family members with htn.  Right lower back and hip - Denies any injury. Started 2 months ago but has gradually been getting worse.  She is having some trouble with range of motion and mobility. worse when she goes to sleep at night.  Bowels have changed shape.  Dark in color.  Very spiral shaped.  Denies any fever.  Normally good bowel habits.  Mother with hx of colon cancer.  Last colonscopy in 1998. Dx with diverticulosis but no polyps at that time.  Hx of lupus - she is not on any current meds.  She did get full work up at age of dx in 57.  She does not have many flares.  Last "bad flare" was in 1993.  She was on prednisone then and injections. She then changed her diet.  no spicy foods no tobacco  no ETOH use some chocholate    Prior Medication List:  No prior medications documented  Current Allergies: ! CODEINE !  TETRACYCLINE ! IODINE ! PENICILLIN ! * MILK PRODUCTS ! * LATEX  Past Surgical History:    Hysterectomy partial     Lumpectomy - left    Tubal ligation    Tonsillectomy    Cholecystectomy    Total knee replacement - left   Family History:    Family History Hypertension - mother, brother, sister    Family History Diabetes 1st degree relative - brother, sister, aunt    Family History Breast cancer 1st degree relative <50 -mother, maternal aunt    Colon cancer - mother      Social History:    Never Smoked    Alcohol use-no    Drug use-no    4 children   Risk Factors:  Tobacco use:  never Drug use:  no Caffeine use:  1 drinks per day Alcohol use:  no Seatbelt use:  100 % Sun Exposure:  occasionally  Mammogram History:     Date of Last Mammogram:  09/20/2005    Results:  normal per  pt   Colonoscopy History:     Date of Last Colonoscopy:  09/20/1996    Results:  diverticulitis    Review of Systems  General      Complains of sweats.      Denies chills, fatigue, and fever.  CV      Complains of chest pain or discomfort.      Denies fatigue and palpitations.  Resp      Denies cough and shortness of breath.  GI      Complains of abdominal pain.  MS      right hip pain   Physical Exam  General:     alert.   Head:     normocephalic.   Eyes:     pupils round.   Ears:     R ear normal and L ear normal.   Nose:     no external deformity.   Mouth:     fair dentition.   Lungs:     normal respiratory effort, no intercostal retractions, no accessory muscle use, and normal breath sounds.   Heart:     normal rate, regular rhythm, no murmur, and no gallop.   Abdomen:     Right lower abd tenderness normal bowel sounds.   Msk:     up to exam table but slow Neurologic:     alert & oriented X3.     Hip Exam  Hip Exam:    Right:    Inspection:  Abnormal    Palpation:  Abnormal       Location:  trochanteric bursa    Stability:  stable     Tenderness:  greater trochater    Swelling:  no    Erythema:  no    Impression & Recommendations:  Problem # 1:  HYPERTENSION, BENIGN ESSENTIAL (ICD-401.1) Will start on meds for htn. though pt is reluctant. no acute problems noted on EKG Her updated medication list for this problem includes:    Toprol Xl 25 Mg Tb24 (Metoprolol succinate) .Marland Kitchen... 1 tablet by mouth daily for blood pressure  Orders: T-General Health Panel (CBCD, CMP, TSH) ([id number]) EKG w/ Interpretation (93[address])   Problem # 2:  ABDOMINAL PAIN (ICD-789.00) will send for CT Orders: T-General Health Panel (CBCD, CMP, TSH) ([id number]) CT with Contrast (CT w/ contrast)   Problem # 3:  HIP PAIN, RIGHT (ICD-719.45) after rx for 3 anti-inflammatories printed pt finally agreed to take ibuprofen.  will see if pain improves.  if not will need films The following medications were removed from the medication list:    Naprosyn 500 Mg Tabs (Naproxen) .Marland Kitchen... 1 tablet by mouth two times a day as needed for inflammation    Diclofenac Sodium 50 Mg Tbec (Diclofenac sodium) .Marland Kitchen... 1 tablet by mouth two times a day as needed for pain  Her updated medication list for this problem includes:    Ibuprofen 800 Mg Tabs (Ibuprofen) .Marland Kitchen... 1 tablet by mouth two times a day as needed for pain   Complete Medication List: 1)  Toprol Xl 25 Mg Tb24 (Metoprolol succinate) .Marland Kitchen.. 1 tablet by mouth daily for blood pressure 2)  Ibuprofen 800 Mg Tabs (Ibuprofen) .Marland Kitchen.. 1 tablet by mouth two times a day as needed for pain   Patient Instructions: 1)  You will need to schedule appointment for Complete Physical Exam in next 2 weeks.  2)  Will do u/a, fasting labs, vaginal exam, schedule mammogram, rectal exam. 3)  You will also need to get colonscopy scheduled.  4)  Differential for current symptoms 5)  Cardiac vs Upper GI - will check EKG today. Start BP meds. Limit things that may exacerbate Gi symptoms such as chocolate, caffeine, spicy, greasy  foods. 6)  Right hip pain and lower abdominal pain may or may not be related. 7)  Will schedule CT with contrast to evaluate abdominal organs. 8)  Anti-inflammatories to see if hip pain is relieved    Prescriptions: IBUPROFEN 800 MG  TABS (IBUPROFEN) 1 tablet by mouth two times a day as needed for pain  #30 x 0   Entered and Authorized by:   Lehman Prom FNP   Signed by:   Lehman Prom FNP on 11/03/2007   Method used:   Print then Give to Patient   RxID:   [id number] DICLOFENAC SODIUM 50 MG  TBEC (DICLOFENAC SODIUM) 1 tablet by mouth two times a day as needed for pain  #30 x 0   Entered and Authorized by:   Lehman Prom FNP   Signed by:   Lehman Prom FNP on 11/03/2007   Method used:   Print then Give to Patient   RxID:   [id number] NAPROSYN 500 MG  TABS (NAPROXEN) 1 tablet by mouth two times a day as needed for inflammation  #30 x 0   Entered and Authorized by:   Lehman Prom FNP   Signed by:   Lehman Prom FNP on 11/03/2007   Method used:   Print then Give to Patient   RxID:   [id number] TOPROL XL 25 MG  TB24 (METOPROLOL SUCCINATE) 1 tablet by mouth daily for blood pressure  #30 x 3   Entered and Authorized by:   Lehman Prom FNP   Signed by:   Lehman Prom FNP on 11/03/2007   Method used:   Print then Give to Patient   RxID:   [id number]  ]

## 2010-10-20 NOTE — Letter (Signed)
Summary[facility]U [facility]  [facility]   Imported By: Arta Bruce 09/25/2008 10:02:10  _____________________________________________________________________  External Attachment:    Type:   Image     Comment:   External Document

## 2010-10-20 NOTE — Letter (Signed)
Summary: FAXED REQUESTED RECORDS TO [facility]  FAXED REQUESTED RECORDS TO [facility]   Imported By: Arta Bruce 02/16/2008 11:44:44  _____________________________________________________________________  External Attachment:    Type:   Image     Comment:   External Document

## 2010-10-20 NOTE — Assessment & Plan Note (Signed)
Summary: Lupus Flare/HTN   Vital Signs:  Patient profile:   60 year old female Height:      65.5 inches Weight:      212 pounds BMI:     34.87 Temp:     98.1 degrees F oral Pulse rate:   72 / minute Pulse rhythm:   regular Resp:     18 per minute BP sitting:   118 / 82  (left arm) Cuff size:   large  Vitals Entered By: [place] Shannon (March 28, 2009 2:17 PM) CC: f/u on lupus......., Hypertension Management Is Patient Diabetic? No Pain Assessment Patient in pain? yes     Location: joint Intensity: 7 Type: aching Onset of pain  Constant  Does patient need assistance? Functional Status Self care Ambulation Normal   CC:  f/u on lupus....... and Hypertension Management.  History of Present Illness:  Pt into the office with c/o lupus flare. Pt has not been here in several months due to being treated for breast cancer. Pt is usually managed with ibuprofen and tylenol. She also had to take hydrocodone once during her treatment. Currently with stiffness in hands and feet      Hypertension History:      She denies headache, chest pain, and palpitations.  She notes no problems with any antihypertensive medication side effects.  pt is taking medications.        Positive major cardiovascular risk factors include female age 42 years old or older and hypertension.  Negative major cardiovascular risk factors include non-tobacco-user status.        Further assessment for target organ damage reveals no history of ASHD, cardiac end-organ damage (CHF/LVH), stroke/TIA, peripheral vascular disease, renal insufficiency, or hypertensive retinopathy.     Allergies: 1)  ! Codeine 2)  ! Tetracycline 3)  ! Iodine 4)  ! Penicillin 5)  ! * Milk Products 6)  ! * Latex  Review of Systems CV:  Denies chest pain or discomfort. Resp:  Denies cough. GI:  Denies abdominal pain, nausea, and vomiting. MS:  Complains of joint pain; bil hand stilffness.  Physical Exam  General:  alert.     Head:  normocephalic.   Lungs:  normal breath sounds.   Heart:  normal rate and regular rhythm.   Abdomen:  normal bowel sounds.   Msk:  up to the exam table Neurologic:  alert & oriented X3.     Wrist/Hand Exam  Hand Exam:    Bil hands with inflammed joints   Impression & Recommendations:  Problem # 1:  HYPERTENSION, BENIGN ESSENTIAL (ICD-401.1) continue current meds Her updated medication list for this problem includes:    Toprol Xl 50 Mg Tb24 (Metoprolol succinate) ..... One tablet by mouth daily for blood pressure    Hydrochlorothiazide 25 Mg Tabs (Hydrochlorothiazide) .Marland Kitchen... Take one (1) by mouth daily  Problem # 2:  LUPUS (ICD-710.0)  Her updated medication list for this problem includes:    Ibuprofen 800 Mg Tabs (Ibuprofen) .Marland Kitchen... 1 tablet by mouth two times a day as needed for pain    Tamoxifen Citrate 20 Mg Tabs (Tamoxifen citrate) ..... One tablet by mouth daily  Complete Medication List: 1)  Ibuprofen 800 Mg Tabs (Ibuprofen) .Marland Kitchen.. 1 tablet by mouth two times a day as needed for pain 2)  Toprol Xl 50 Mg Tb24 (Metoprolol succinate) .... One tablet by mouth daily for blood pressure 3)  Hydrocodone-acetaminophen 5-500 Mg Tabs (Hydrocodone-acetaminophen) .Marland Kitchen.. 1 tablet by mouth two times a day  as needed  **rx given after procedure** 4)  Nexium 40 Mg Cpdr (Esomeprazole magnesium) .... One tablet by mouth daily 5)  Tamoxifen Citrate 20 Mg Tabs (Tamoxifen citrate) .... One tablet by mouth daily 6)  Hydrochlorothiazide 25 Mg Tabs (Hydrochlorothiazide) .... Take one (1) by mouth daily 7)  Multivitamins Tabs (Multiple vitamin) .... One tablet by mouth daily 8)  Fish Oil 300 Mg Caps (Omega-3 fatty acids) .... One capsule by mouth daily  Hypertension Assessment/Plan:      The patient's hypertensive risk group is category B: At least one risk factor (excluding diabetes) with no target organ damage.  Her calculated 10 year risk of coronary heart disease is 7 %.  Today's blood  pressure is 118/82.  Her blood pressure goal is < 140/90.  Patient Instructions: 1)  Continue medications 2)  Follow up as needed 3)  Sign release to get records from [facility] Prescriptions: IBUPROFEN 800 MG  TABS (IBUPROFEN) 1 tablet by mouth two times a day as needed for pain  #60 x 1   Entered and Authorized by:   Lehman Prom FNP   Signed by:   Lehman Prom FNP on 03/28/2009   Method used:   Printed then faxed to ...       [facility] - [facility] (retail)       [address] [place], [place]  [address]       Ph: [phone]       Fax: [phone]   RxID:   [id number] TAMOXIFEN CITRATE 20 MG TABS (TAMOXIFEN CITRATE) One tablet by mouth daily  #30 x 11   Entered and Authorized by:   Lehman Prom FNP   Signed by:   Lehman Prom FNP on 03/28/2009   Method used:   Printed then faxed to ...       [facility] - [facility] (retail)       [address] [place], [place]  [address]       Ph: [phone]       Fax: [phone]   RxID:   [id number] HYDROCHLOROTHIAZIDE 25 MG TABS (HYDROCHLOROTHIAZIDE) Take one (1) by mouth daily  #30 x 6   Entered and Authorized by:   Lehman Prom FNP   Signed by:   Lehman Prom FNP on 03/28/2009   Method used:   Printed then faxed to ...       [facility] (retail)       [address] [place], [place]  [address]       Ph: [phone]       Fax: [phone]   RxID:   [id number] TOPROL XL 50 MG  TB24 (METOPROLOL SUCCINATE) One tablet by mouth daily for blood pressure  #30 x 6   Entered and Authorized by:   Lehman Prom FNP   Signed by:   Lehman Prom FNP on 03/28/2009   Method used:   Printed then faxed to ...       [facility] - [facility] (retail)       [address] [place], [place]  [address]       Ph: [phone]       Fax: [phone]   RxID:    [id number] NEXIUM 40 MG CPDR (ESOMEPRAZOLE MAGNESIUM) One tablet by mouth daily  #30 x 5  Entered and Authorized by:   Lehman Prom FNP   Signed by:   Lehman Prom FNP on 03/28/2009   Method used:   Printed then faxed to ...       [facility] (retail)       [address] [place], [place]  [address]       Ph: [phone]       Fax: [phone]   RxID:   [id number]

## 2010-10-20 NOTE — Progress Notes (Signed)
Summary: Surgery  Phone Note Call from Patient   Summary of Call: pt called and wanted you to know that she is having another surgery next Wednesday to see if it has spread but this time they will inject die and that the following week she will start at the [facility].  I told her that we would keep her lifted up in prayer. Initial call taken by: Levon Hedger,  April 12, 2008 2:43 PM  Follow-up for Phone Call        noted Follow-up by: Lehman Prom FNP,  April 15, 2008 7:49 AM

## 2010-10-20 NOTE — Letter (Signed)
Summary: *HSN Results Follow up  [facility]  [address] [place], [place] [address]   Phone: [phone]  Fax: [phone]      09/10/2009   [name] [address] [place], [place]  [address]   Dear  [name],                            ____S.Drinkard,FNP   ____D. Gore,FNP       ____B. McPherson,MD   ____V. Rankins,MD    ____E. Mulberry,MD    __X__N. [name], FNP  ____D. Reche Dixon, MD    ____K. Philipp Deputy, MD    ____Other     This letter is to inform you that your recent test(s):  _______Pap Smear    ___X____Lab Test     _______X-ray    ___X____ is within acceptable limits  _______ requires a medication change  _______ requires a follow-up lab visit  _______ requires a follow-up visit with your provider   Comments: Labs done during recent office visit are normal.       _________________________________________________________ If you have any questions, please contact our office [phone].                    Sincerely,    Lehman Prom FNP [facility]
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
## 2010-10-20 NOTE — Progress Notes (Signed)
Summary: NEEDS BP MEDICATION  Phone Note Call from Patient   Summary of Call: NYKEDTRA PT. WAS [facility] AND SHE CAN NO LONGER GET THAT MEDICATION BECAUSE SHE WAS TOLD THAT THE PHARMACUTICAL COMP. CLOSED AND SHE ONLY HAS 1 PILL LEFT FOR TODAY, SO SHE WOULD LIKE FOR THE MEDICINE TO BE CALLED INTO [place]. SHE SAYS TO PLEASE CALL AND LET HER KNOW  WHEN ITS CALLED IN. [phone] Initial call taken by: Leodis Rains,  November 16, 2007 1:00 PM  Follow-up for Phone Call        Med printed and ready to fax.  pt will need metoprolol 25mg  two times a day.  [facility] [place] Fax med to her choice.   Follow-up by: Lehman Prom FNP,  November 17, 2007 8:14 AM  Additional Follow-up for Phone Call Additional follow up Details #1::        left message on machine for pt to return call to office Additional Follow-up by: Levon Hedger,  November 17, 2007 8:23 AM    Additional Follow-up for Phone Call Additional follow up Details #2::    PHONE CALL COMPLETE. Follow-up by: Levon Hedger,  November 17, 2007 5:03 PM  New/Updated Medications: METOPROLOL TARTRATE 25 MG  TABS (METOPROLOL TARTRATE) 1 tablet by mouth two times a day for blood pressure   Prescriptions: METOPROLOL TARTRATE 25 MG  TABS (METOPROLOL TARTRATE) 1 tablet by mouth two times a day for blood pressure  #60 x 3   Entered and Authorized by:   Lehman Prom FNP   Signed by:   Lehman Prom FNP on 11/17/2007   Method used:   Printed then faxed to ...         RxID:   [id number]

## 2010-10-20 NOTE — Letter (Signed)
Summary: Handout Printed  Printed Handout:  - Systemic Lupus Erythematosus

## 2010-10-20 NOTE — Letter (Signed)
Summary: *HSN Results Follow up  [facility]  [address] [place], [place] [address]   Phone: [phone]  Fax: [phone]      04/01/2008   [name] [address] [place], [place]  [address]   Dear  [name],                            ____S.Drinkard,FNP   ____D. Gore,FNP       ____B. McPherson,MD   ____V. Rankins,MD    ____E. Mulberry,MD    __x__N. [name], FNP  ____D. Reche Dixon, MD    ____K. Philipp Deputy, MD    ____Other     This letter is to inform you that your recent test(s):  _______Pap Smear    _______Lab Test     _______X-ray    _______ is within acceptable limits  _______ requires a medication change  _______ requires a follow-up lab visit  _______ requires a follow-up visit with your provider   Comments:Please contact our office regarding your lab results.       _________________________________________________________ If you have any questions, please contact our office                     Sincerely,  Mikey College CMA [facility]

## 2010-10-20 NOTE — Progress Notes (Signed)
Summary: Pt needs Flu vaccine  Phone Note Outgoing Call   Summary of Call: call this pt and let her know the flu vaccines are here in office tell her that nykedtra recommend she come in to the office to get one she can schedule a triage visit for the vaccine Initial call taken by: Lehman Prom FNP,  July 01, 2010 2:14 PM  Follow-up for Phone Call        "Subscriber not available" -unable to leave message.  Dutch Quint RN  July 02, 2010 11:23 AM  Appt. made with triage 07/09/10.  Dutch Quint RN  July 03, 2010 5:10 PM

## 2010-10-20 NOTE — Assessment & Plan Note (Signed)
Summary: HTN/Breast Carcinoma   Vital Signs:  Patient Profile:   60 Years Old Female Height:     65.50 inches Weight:      206 pounds BMI:     33.88 Temp:     97.6 degrees F oral Pulse rate:   66 / minute Pulse rhythm:   regular Resp:     16 per minute BP sitting:   120 / 70  (left arm) Cuff size:   regular  Pt. in pain?   no  Vitals Entered By: Murvin Natal SMA(January 11, 2008 8:34 AM)              Is Patient Diabetic? No  Does patient need assistance? Ambulation Normal     Chief Complaint:  follow up and breast biopsy was positive.  History of Present Illness:  Pt into the office for f/u breast biopsy.  She was notified that biopsy was positive.  This [name] discussed possibility of this dx on last visit.   She is to have f/u for consultation with the surgeon on tomorrow.  She notes that if possible she will have a lumpectomy.  She is going to have MRI of bilateral breast to check status.   She has signed up for [facility] to get approval for MRI and the possible surgery. Pt has some questions about the lumpectomy.  Insomnia - pt recently lost her ex-husband who is her children's father.  He died of lung cancer.  Pt reports that her children are not responding well with their father's death. Her recent dx has kept her from sleeping.  She did not have problems with sleep before.             Prior Medication List:  IBUPROFEN 800 MG  TABS (IBUPROFEN) 1 tablet by mouth two times a day as needed for pain METOPROLOL TARTRATE 25 MG  TABS (METOPROLOL TARTRATE) 1 tablet by mouth two times a day for blood pressure PROTONIX 40 MG  TBEC (PANTOPRAZOLE SODIUM) 1 tab once daily for stomach GOLYTELY 236 GM  SOLR (PEG 3350-KCL-NABCB-NACL-NASULF) use as directed   Current Allergies (reviewed today): ! CODEINE ! TETRACYCLINE ! IODINE ! PENICILLIN ! * MILK PRODUCTS ! * LATEX    Risk Factors: Tobacco use:  never Drug use:  no Caffeine use:  1 drinks per day Alcohol  use:  no Exercise:  no Seatbelt use:  100 % Sun Exposure:  occasionally  Colonoscopy History:    Date of Last Colonoscopy:  11/24/2007  Mammogram History:    Date of Last Mammogram:  12/07/2007  PAP Smear History:    Date of Last PAP Smear:  11/24/2007   Review of Systems  General      Denies fever.  GI      Denies abdominal pain.  MS      right hip pain improved   Physical Exam  General:     alert.   Head:     normocephalic.   Eyes:     pupils round.   Ears:     R ear normal and L ear normal.   Lungs:     normal breath sounds.   Heart:     normal rate and regular rhythm.   Abdomen:     soft, non-tender, and normal bowel sounds.   Msk:     normal ROM.   Neurologic:     alert & oriented X3.   Skin:     color normal.   Psych:  Oriented X3.      Impression & Recommendations:  Problem # 1:  CARCINOMA, BREAST, RIGHT (ICD-174.9) Pt is going to have surgery consultation on tomorrow. She will need MRI and that will determine treatment plans. Hopefully sleep habits will improve as stressors decrease.  Problem # 2:  CONSTIPATION (ICD-564.00) stable. pt took Golytely on last visit. The following medications were removed from the medication list:    Golytely 236 Gm Solr (Peg 3350-kcl-nabcb-nacl-nasulf) ..... Use as directed  will send urine for culture to check for clearance for cystitis.  Complete Medication List: 1)  Ibuprofen 800 Mg Tabs (Ibuprofen) .Marland Kitchen.. 1 tablet by mouth two times a day as needed for pain 2)  Metoprolol Tartrate 25 Mg Tabs (Metoprolol tartrate) .Marland Kitchen.. 1 tablet by mouth two times a day for blood pressure 3)  Protonix 40 Mg Tbec (Pantoprazole sodium) .Marland Kitchen.. 1 tab once daily for stomach  Other Orders: UA Dipstick w/o Micro (manual) (45409) T-Culture, Urine ([id number])   Patient Instructions: 1)  Keep appointment with surgeon as ordered. 2)  Keep me informed on your progress. 3)  Follow up here in 2- 3 months for  hypertension.    ] Laboratory Results   Urine Tests  Date/Time Recieved: January 11, 2008 9:31 AM   Routine Urinalysis   Color: yellow Glucose: negative   (Normal Range: Negative) Bilirubin: negative   (Normal Range: Negative) Ketone: negative   (Normal Range: Negative) Spec. Gravity: 1.010   (Normal Range: 1.003-1.035) Blood: trace-intact   (Normal Range: Negative) pH: 6.0   (Normal Range: 5.0-8.0) Protein: negative   (Normal Range: Negative) Urobilinogen: 0.2   (Normal Range: 0-1) Nitrite: negative   (Normal Range: Negative) Leukocyte Esterace: negative   (Normal Range: Negative)

## 2010-10-20 NOTE — Progress Notes (Signed)
Summary: RESULTS/IN PAIN  Phone Note Call from Patient Call back at Home Phone [phone]   Reason for Call: Talk to Doctor Summary of Call: [name] PT. WANTS HER URINE TEST RESULTS. SHE SAYS THE SHE IS ALSO HURTING FROM AND SHE THINKS THE PAIN IS COMING FROM THE BREAST CANCER AND THE 800mg  IBUFROFEN  ISN'T HELPING AND HER SURGERY IS JULY 1st, AND THE SURGEON SAID FOR HER TO CALL HER PRIMARY DR[facility] [address] Initial call taken by: Leodis Rains,  March 06, 2008 3:42 PM  Follow-up for Phone Call        Spoke with pt she states she has been waiting on her results from her urine test and that she is in pain and she does not know where it is coming from.  She wants something for pain or does she go to the ER because she has not gotten any response.  I told her that I would get this message to you today and get back to her. Follow-up by: Levon Hedger,  March 12, 2008 10:00 AM  Additional Follow-up for Phone Call Additional follow up Details #1::        No need for antibiotics at that time as per last urine.  However if she feels condition has changed she can come back for repeat u/a. Will order darvocet for pain which is stronger than ibuprofen.  I don't want to give her meds that are too strong since she is not used to taking pain meds.  Additional Follow-up by: Lehman Prom FNP,  March 12, 2008 11:16 AM    Additional Follow-up for Phone Call Additional follow up Details #2::    pt aware.  [facility]  Phone note complete Follow-up by: Levon Hedger,  March 12, 2008 12:42 PM  New/Updated Medications: DARVOCET-N 100 100-650 MG  TABS (PROPOXYPHENE N-APAP) 1 tablet by mouth two times a day as needed for pain   Prescriptions: DARVOCET-N 100 100-650 MG  TABS (PROPOXYPHENE N-APAP) 1 tablet by mouth two times a day as needed for pain  #50 x 0   Entered and Authorized by:   Lehman Prom FNP   Signed by:   Lehman Prom FNP on 03/12/2008   Method used:   Print  then Give to Patient   RxID:   [id number]

## 2010-10-20 NOTE — Letter (Signed)
Summary: [facility]/NEW PATIENT EVAL  [facility]NEW PATIENT EVAL   Imported By: Arta Bruce 08/14/2008 10:27:42  _____________________________________________________________________  External Attachment:    Type:   Image     Comment:   External Document

## 2010-10-20 NOTE — Letter (Signed)
Summary: [facility]//PROGRESS NOTE  [facility]//PROGRESS NOTE   Imported By: Arta Bruce 04/01/2010 10:19:35  _____________________________________________________________________  External Attachment:    Type:   Image     Comment:   External Document

## 2010-10-20 NOTE — Letter (Signed)
Summary: REQUESTING RECORDS FROM [facility] [place]  REQUESTING RECORDS FROM [facility]   Imported By: Arta Bruce 08/18/2009 10:35:20  _____________________________________________________________________  External Attachment:    Type:   Image     Comment:   External Document

## 2010-10-20 NOTE — Assessment & Plan Note (Signed)
Summary: HTN   Vital Signs:  Patient profile:   60 year old female Weight:      203.1 pounds BMI:     33.40 BSA:     2.00 Temp:     98.0 degrees F oral Pulse rate:   57 / minute Pulse rhythm:   regular BP sitting:   119 / 81  (left arm) Cuff size:   large  Vitals Entered By: Levon Hedger (September 04, 2009 10:55 AM) CC: 2 month follow-up...cough with phlem x 2 weeks...right toe feels numb alot not sure why                     Is Patient Diabetic? No Pain Assessment Patient in pain? no       Does patient need assistance? Functional Status Self care Ambulation Normal   CC:  2 month follow-up...cough with phlem x 2 weeks...right toe feels numb alot not sure why                    .  History of Present Illness:  Pt into the office for follow up.  Cough - Present for the past 3 weeks.  -fever -wheezes Productive for phlegm very little nasal congestion  Numbness - Right great toe numbness  -hx of remote fracture in 3rd toe that was not corrected, healed on it's own Tingling in great toe only, no inflammation of the joint  January 11th - Rheumatology in [facility] Pt already has the information regarding the appointment and she has plans to go to the appointments  Breast Cancer - mammogram due in 11/2009 She has not been to oncology for last f/u due to change of insurance.  Pt would like this office to set up f/u with oncology. She continues to take tamoxifen   Allergies: 1)  ! Codeine 2)  ! Tetracycline 3)  ! Iodine 4)  ! Penicillin 5)  ! * Milk Products 6)  ! * Latex  Review of Systems General:  Complains of fatigue; denies fever. CV:  Denies chest pain or discomfort. Resp:  Complains of cough; denies shortness of breath and wheezing. GI:  Denies abdominal pain, nausea, and vomiting. MS:  Complains of joint pain.  Physical Exam  General:  alert.   Head:  normocephalic.   Lungs:  normal breath sounds.   Heart:  normal rate and regular rhythm.     Abdomen:  normal bowel sounds.   Msk:  ulnar deviation in bil hands R>L  Extremities:  no edema Neurologic:  alert & oriented X3.   Skin:  color normal.   Psych:  Oriented X3.     Impression & Recommendations:  Problem # 1:  HYPERTENSION, BENIGN ESSENTIAL (ICD-401.1) stable DASH diet Her updated medication list for this problem includes:    Toprol Xl 50 Mg Tb24 (Metoprolol succinate) ..... One tablet by mouth daily for blood pressure    Hydrochlorothiazide 25 Mg Tabs (Hydrochlorothiazide) ..... One tablet by mouth daily  Orders: T-Urine Microalbumin w/creat. ratio [phone]) UA Dipstick w/o Micro (manual) (07371) T-TSH ([id number]) Rapid HIV  (27035) T-Comprehensive Metabolic Panel ([id number]) T-Lipid Profile (37169-67893) T- * Misc. Laboratory test [phone])  Problem # 2:  LUPUS (ICD-710.0) Pt has f/u with rheumatology in [facility]: Lambert Rhonda W scheduled for next month -  advised pt to keep The following medications were removed from the medication list:    Ibuprofen 800 Mg Tabs (Ibuprofen) .Marland Kitchen... 1 tablet by mouth two times a day as needed  for pain Her updated medication list for this problem includes:    Tamoxifen Citrate 20 Mg Tabs (Tamoxifen citrate) ..... One tablet by mouth daily    Meloxicam 15 Mg Tabs (Meloxicam) ..... One tablet by mouth daily for pain  Problem # 3:  COUGH (ICD-786.2) advise Mucinex plenty of fluids conservative management  Problem # 4:  NEUROPATHY (ICD-355.9) advised pt of dx avoid fitting shoes conservative management for now but may consider meds in the future  Problem # 5:  FATIGUE (ICD-780.79) will check labs Orders: T-Vitamin D 25-Hydroxy & 1,25 Dihydroxy (8147)  Problem # 6:  CARCINOMA, BREAST, RIGHT (ICD-174.9) will call oncology and see if they can schedule a f/u appt for pt  Problem # 7:  ARTHRALGIA (ICD-719.40) darvocet d/c'd will start meloxicam for symptoms  Complete Medication List: 1)  Toprol Xl 50 Mg Tb24 (Metoprolol  succinate) .... One tablet by mouth daily for blood pressure 2)  Nexium 40 Mg Cpdr (Esomeprazole magnesium) .... One tablet by mouth daily 3)  Tamoxifen Citrate 20 Mg Tabs (Tamoxifen citrate) .... One tablet by mouth daily 4)  Hydrochlorothiazide 25 Mg Tabs (Hydrochlorothiazide) .... One tablet by mouth daily 5)  Multivitamins Tabs (Multiple vitamin) .... One tablet by mouth daily 6)  Fish Oil 300 Mg Caps (Omega-3 fatty acids) .... One capsule by mouth daily 7)  Meloxicam 15 Mg Tabs (Meloxicam) .... One tablet by mouth daily for pain   Patient Instructions: 1)  Neuropathy - Left foot 2)  (read handoout) 3)  Cough - most likely not infection 4)  Plenty of fluids both hot and cold 5)  Mucinex (not DM) 6)  Follow up in 3 months or sooner if necessary 7)  You will be notified of any abnormal lab results 8)  This office will send referral the [facility] - call them next week to see if they recieved it and have an appointment scheduled for you Prescriptions: HYDROCHLOROTHIAZIDE 25 MG TABS (HYDROCHLOROTHIAZIDE) One tablet by mouth daily  #30 x 5   Entered and Authorized by:   Lehman Prom FNP   Signed by:   Lehman Prom FNP on 09/04/2009   Method used:   Print then Give to Patient   RxID:   [id number] MELOXICAM 15 MG TABS (MELOXICAM) One tablet by mouth daily for pain  #30 x 6   Entered and Authorized by:   Lehman Prom FNP   Signed by:   Lehman Prom FNP on 09/04/2009   Method used:   Print then Give to Patient   RxID:   [id number]   Laboratory Results   Urine Tests  Date/Time Received: September 04, 2009 11:11 AM  Date/Time Reported: September 04, 2009 11:11 AM   Routine Urinalysis   Color: Janyth Riera Appearance: Clear Glucose: negative   (Normal Range: Negative) Bilirubin: negative   (Normal Range: Negative) Ketone: trace (5)   (Normal Range: Negative) Spec. Gravity: 1.010   (Normal Range: 1.003-1.035) Blood: negative   (Normal Range: Negative) pH: 7.0    (Normal Range: 5.0-8.0) Protein: 30   (Normal Range: Negative) Urobilinogen: 1.0   (Normal Range: 0-1) Nitrite: negative   (Normal Range: Negative) Leukocyte Esterace: negative   (Normal Range: Negative)

## 2010-10-20 NOTE — Letter (Signed)
Summary: *HSN Results Follow up  [facility]  [address] [place], [place] [address]   Phone: [phone]  Fax: [phone]      05/12/2009   SHELSIE TIJERINO [address] [place], [place]  [address]   Dear  [name],                            ____S.Drinkard,FNP   ____D. Gore,FNP       ____B. McPherson,MD   ____V. Rankins,MD    __X__E. Mulberry,MD    ____N. [name], FNP  ____D. Reche Dixon, MD    ____K. Philipp Deputy, MD    ____Other     This letter is to inform you that your recent test(s):  _______Pap Smear    ___X____Lab Test     _______X-ray    ___X____ is within acceptable limits  _______ requires a medication change  _______ requires a follow-up lab visit  _______ requires a follow-up visit with your provider   Comments:  Your labs do not support any ongoing inflammation/arthritis in your body.  Your ANA is positive, but very weakly so.  I can set you up with Rheumatology at [facility] to get an opinion regarding this, or if you would like to wait and discuss with Lehman Prom that would be fine as well--let me know which you would like to do.       _________________________________________________________ If you have any questions, please contact our office                     Sincerely,  Julieanne Manson MD [facility]

## 2010-10-20 NOTE — Letter (Signed)
Summary: B/P READINGS  B/P READINGS   Imported By: Arta Bruce 03/20/2010 11:45:38  _____________________________________________________________________  External Attachment:    Type:   Image     Comment:   External Document

## 2010-10-20 NOTE — Letter (Signed)
Summary: [facility]  [facility]   Imported By: Arta Bruce 11/18/2009 15:59:34  _____________________________________________________________________  External Attachment:    Type:   Image     Comment:   External Document

## 2010-10-20 NOTE — Letter (Signed)
Summary: *HSN Results Follow up  [facility]  [address] [place], [place] [address]   Phone: [phone]  Fax: [phone]      12/01/2007   KATELYN BROADNAX [address] [place], [place]  [address]   Dear  [name],                            ____S.Drinkard,FNP   ____D. Gore,FNP       ____B. McPherson,MD   ____V. Rankins,MD    ____E. Mulberry,MD    __X__N. [name], FNP  ____D. Reche Dixon, MD    ____K. Philipp Deputy, MD    ____Other     This letter is to inform you that your recent test(s):  ___X____Pap Smear    _______Lab Test     _______X-ray    ___X____ is within acceptable limits  _______ requires a medication change  _______ requires a follow-up lab visit  _______ requires a follow-up visit with your provider   Comments:  Pap Smear was normal.       _________________________________________________________ If you have any questions, please contact our office [phone].                    Sincerely,    Lehman Prom FNP [facility]

## 2010-10-20 NOTE — Assessment & Plan Note (Signed)
Summary: Complete Physical Exam   Vital Signs:  Patient Profile:   60 Years Old Female Height:     65.50 inches Weight:      206 pounds BMI:     33.88 BSA:     2.02 Temp:     97.5 degrees F oral Pulse rate:   72 / minute Pulse rhythm:   regular Resp:     16 per minute BP sitting:   150 / 90  (left arm) Cuff size:   regular  Pt. in pain?   yes    Intensity:   6  Vitals Entered By: Levon Hedger (November 24, 2007 10:00 AM)              Is Patient Diabetic? No  Does patient need assistance? Ambulation Normal     Chief Complaint:  CPP.  History of Present Illness:  Complete Physical Exam  Pt seen in office on last visit for mulitple complaints:  HTN - she was to start toprol XL 25mg  daily however recieved from pharmacy that manufactuer is not making med at this time. she was changed to metoprolol 25mg  two times a day on 11/21/07.  EKG done on last visit.  She is able to check her blood pressure at home  Abd pain - pt was to get Ct of abd as scheduled on last visit. no indication in EMR that pt went for this test.  Pt reports that she was not given the appointment date for this.  Pt still complains of some pain in her right abd and flank.  She reports that the ibuprofen did not help with this pain.  She reports that she is unable to take the ibuprofen anyway as she was reluctant to take it. makes her "sick".  No nausea, vomitng or fever.  Hip pain - pt was to start anti-inflammatories as per last visit.  If no improvement then she would get films. Pt reports that pain in right side/flank persists.  Mammogram - Last mammogram in 2007.  She needs to get this scheduled.  PAP - last done in 2007. menses - s/p partial hysterctomy.  she does still have her ovaries present. Pt does have some vaginal dryness.  Not sexually active at this time.  Some labs done on last visit but was not fasting then so cholesterol done today.  tetanus - pt is due. she has not had in many  years.  Pt did not have the flu vaccine this year.    Prior Medication List:  TOPROL XL 25 MG  TB24 (METOPROLOL SUCCINATE) 1 tablet by mouth daily for blood pressure IBUPROFEN 800 MG  TABS (IBUPROFEN) 1 tablet by mouth two times a day as needed for pain METOPROLOL TARTRATE 25 MG  TABS (METOPROLOL TARTRATE) 1 tablet by mouth two times a day for blood pressure   Current Allergies (reviewed today): ! CODEINE ! TETRACYCLINE ! IODINE ! PENICILLIN ! * MILK PRODUCTS ! * LATEX    Risk Factors: Tobacco use:  never Drug use:  no Caffeine use:  1 drinks per day Alcohol use:  no Seatbelt use:  100 % Sun Exposure:  occasionally  Mammogram History:    Date of Last Mammogram:  09/20/2005  Colonoscopy History:     Date of Last Colonoscopy:  11/24/2007    Results:  defer   Review of Systems  General      Denies chills, fatigue, and fever.  Eyes      Denies discharge.  ENT      Denies earache.  CV      Denies chest pain or discomfort, fatigue, and shortness of breath with exertion.  Resp      Denies cough and shortness of breath.  GI      Denies abdominal pain, diarrhea, nausea, and vomiting.  GU      Complains of dysuria.  Neuro      Denies headaches.  Psych      Denies anxiety and depression.  Endo      Denies excessive hunger, excessive thirst, and excessive urination.   Physical Exam  General:     alert.   Head:     normocephalic.   Eyes:     pupils round.   Ears:     R ear normal and L ear normal.  TM visible bilaterally Nose:     no external deformity.   Mouth:     dentures Neck:     supple and no thyromegaly.   Chest Wall:     no deformities and no tenderness.   Breasts:     skin/areolae normal and no masses.   Lungs:     normal respiratory effort, no intercostal retractions, no accessory muscle use, and normal breath sounds.   Heart:     normal rate, regular rhythm, no murmur, no gallop, and no rub.   Abdomen:     Right lower abd  healed incision = s/p cholescystectomy Right upper quad tenderness with palpation epigastric tenderness right flank pain BS x 4  Rectal:     no external abnormalities.   guaiac negative Msk:     up to exam table Pulses:     R radial normal.   Extremities:     no edema. Neurologic:     alert & oriented X3, cranial nerves II-XII intact, and strength normal in all extremities.   Skin:     color normal.  dry Axillary Nodes:     no R axillary adenopathy and no L axillary adenopathy.   Psych:     Oriented X3.    Pelvic Exam  Vulva:      normal appearance.   Vagina:      post-menopausal.  tender during speculum exam Cervix:      absent Adnexa:      nontender bilaterally.   Rectum:      normal, heme negative stool.       Impression & Recommendations:  Problem # 1:  HEALTH MAINTENANCE EXAM (ICD-V70.0) EKG done on last visit. labs reviewed from last visit and lipids done today PAP done today rectal negative mammogram scheduled colonscopy protocol reviewed Tdap given. recommend routine optho and dental evals  Orders: UA Dipstick w/o Micro (manual) ([address]) Wet Prep (60454UJ) KOH/ WET Mount [phone]) Pap Smear, Thin Prep ( Collection of) (Y7829) Thin Prep Pap (56213) Hemoccult Cards MCR Screening (Y8657) T-Lipid Profile (84696-29528) T-HIV Antibody  (Reflex) ([id number]) T-Culture, Urine ([id number]) Mammogram (Screening) (Mammo)   Problem # 2:  HYPERTENSION, BENIGN ESSENTIAL (ICD-401.1) need to monitor blood pressure. may need to titrate dose The following medications were removed from the medication list:    Toprol Xl 25 Mg Tb24 (Metoprolol succinate) .Marland Kitchen... 1 tablet by mouth daily for blood pressure  Her updated medication list for this problem includes:    Metoprolol Tartrate 25 Mg Tabs (Metoprolol tartrate) .Marland Kitchen... 1 tablet by mouth two times a day for blood pressure   Problem # 3:  ABDOMINAL PAIN (ICD-789.00) pt did not get  her CT as ordered Will  reschedule. u/a with some blood and trace leuks.  will treat with macrobid and send u/a for culture. Orders: CT without Contrast (CT w/o contrast)   Medications Added to Medication List This Visit: 1)  Macrobid 100 Mg Caps (Nitrofurantoin monohyd macro) .Marland Kitchen.. 1 tablet by mouth two times a day 2)  Nexium 40 Mg Cpdr (Esomeprazole magnesium) .Marland Kitchen.. 1 tablet by mouth daily for stomach  Complete Medication List: 1)  Ibuprofen 800 Mg Tabs (Ibuprofen) .Marland Kitchen.. 1 tablet by mouth two times a day as needed for pain 2)  Metoprolol Tartrate 25 Mg Tabs (Metoprolol tartrate) .Marland Kitchen.. 1 tablet by mouth two times a day for blood pressure 3)  Macrobid 100 Mg Caps (Nitrofurantoin monohyd macro) .Marland Kitchen.. 1 tablet by mouth two times a day 4)  Nexium 40 Mg Cpdr (Esomeprazole magnesium) .Marland Kitchen.. 1 tablet by mouth daily for stomach  Other Orders: Tdap => 22yrs IM (10258) Admin 1st Vaccine (52778)   Patient Instructions: 1)  High Blood pressure - continue metorolol 25mg  by mouth two times a day. remember to try to take at the same time daily.  If continues to be elevated then we may need to titrate dose. 2)  Get CT scan as ordered. 3)  Take meds for urinary tract infection.  will send for culture and notify if any additional meds are needed 4)  Start meds for stomach. Nexium 40mg  daily.  continue to avoid irritating meds. 5)  Follow up here in 2-3 weeks.    Prescriptions: NEXIUM 40 MG  CPDR (ESOMEPRAZOLE MAGNESIUM) 1 tablet by mouth daily for stomach  #30 x 3   Entered and Authorized by:   Lehman Prom FNP   Signed by:   Lehman Prom FNP on 11/24/2007   Method used:   Print then Give to Patient   RxID:   [id number] MACROBID 100 MG  CAPS (NITROFURANTOIN MONOHYD MACRO) 1 tablet by mouth two times a day  #14 x 0   Entered and Authorized by:   Lehman Prom FNP   Signed by:   Lehman Prom FNP on 11/24/2007   Method used:   Print then Give to Patient   RxID:   [id number]  ] Laboratory Results    Urine Tests  Date/Time Received: November 24, 2007 10:17 AM  Date/Time Reported: November 24, 2007 10:17 AM   Routine Urinalysis   Color: lt. yellow Appearance: Hazy Glucose: negative   (Normal Range: Negative) Bilirubin: negative   (Normal Range: Negative) Ketone: negative   (Normal Range: Negative) Spec. Gravity: 1.010   (Normal Range: 1.003-1.035) Blood: trace-lysed   (Normal Range: Negative) pH: 7.0   (Normal Range: 5.0-8.0) Protein: negative   (Normal Range: Negative) Urobilinogen: 0.2   (Normal Range: 0-1) Nitrite: negative   (Normal Range: Negative) Leukocyte Esterace: trace   (Normal Range: Negative)      Stool - Occult Blood Hemmoccult #1: negative Date: 11/24/2007     Tetanus/Td Vaccine    Vaccine Type: Tdap    Site: left deltoid    Mfr: Sanofi Pasteur    Dose: 0.5 ml    Route: IM    Given by: Levon Hedger    Exp. Date: 10/26/2009    Lot #: [id number]    VIS given: 08/08/07 version given November 24, 2007. NDC# 38250-539-76    Laboratory Results   Urine Tests    Routine Urinalysis   Color: lt. yellow Appearance: Hazy Glucose: negative   (Normal Range: Negative) Bilirubin: negative   (  Normal Range: Negative) Ketone: negative   (Normal Range: Negative) Spec. Gravity: 1.010   (Normal Range: 1.003-1.035) Blood: trace-lysed   (Normal Range: Negative) pH: 7.0   (Normal Range: 5.0-8.0) Protein: negative   (Normal Range: Negative) Urobilinogen: 0.2   (Normal Range: 0-1) Nitrite: negative   (Normal Range: Negative) Leukocyte Esterace: trace   (Normal Range: Negative)        Appended Document: Complete Physical Exam    Clinical Lists Changes  Observations: Added new observation of KOH PREP: Negative (11/25/2007 23:29)       Laboratory Results    Wet Mount/KOH Source: vaginal WBC/hpf 1-5 Bacteria/hpf rare Clue cells/hpf none Yeast/hpf none KOH Negative Trichomonas/hpf none

## 2010-10-20 NOTE — Progress Notes (Signed)
Summary: WANTS TO KNOW HER LAB RESULTS  Phone Note Call from Patient Call back at Home Phone [phone]   Reason for Call: Lab or Test Results Summary of Call: [name] IS CALLING TO SEE IF HER LAB RESULTS ARE IN AND IF SOMEONE COULD CALL HER W/THE RESULTS. Initial call taken by: [name],  September 10, 2009 3:35 PM  Follow-up for Phone Call        forward to N. [name], FNP Follow-up by: [name],  September 10, 2009 5:28 PM  Additional Follow-up for Phone Call Additional follow up Details #1::        notify pt that all labs are normal.  Additional Follow-up by: Jaclyn Prom FNP,  September 10, 2009 5:53 PM    Additional Follow-up for Phone Call Additional follow up Details #2::    pt notified.[name] CMA  September 18, 2009 4:29 PM

## 2010-10-20 NOTE — Assessment & Plan Note (Signed)
Summary: BP check and u/a  Nurse Visit   Vital Signs:  Patient Profile:   60 Years Old Female Height:     65.50 inches BP sitting:   118 / 82  (left arm) Cuff size:   large  Vitals Entered By: Vesta Mixer CMA (February 29, 2008 10:39 AM)             Comments good no change in meds pt is taking metoprolol two times a day     Spoke with pt via phone about concerns with BP and lower abd pressure. Bp is ok here in office.  she is taking metoprolol 25mg  two times a day at 9am and 9pm.  She was previously taking the extended release tablets but was unable to obtain due to shortage.  she is requesting to restart Toprol XL 50mg  daily - she does have an Rx and can get that filled if pharmacy can provide meds. Reports that BP is usually higher at midnight.  Informed pt that there are some usual flutuations of BP at night.  Abd pressure - nothing notable on u/a. need to drink plenty of fluids.   Prior Medications: IBUPROFEN 800 MG  TABS (IBUPROFEN) 1 tablet by mouth two times a day as needed for pain METOPROLOL TARTRATE 25 MG  TABS (METOPROLOL TARTRATE) 1 tablet by mouth two times a day for blood pressure PROTONIX 40 MG  TBEC (PANTOPRAZOLE SODIUM) 1 tab once daily for stomach Current Allergies: ! CODEINE ! TETRACYCLINE ! IODINE ! PENICILLIN ! * MILK PRODUCTS ! * LATEX Laboratory Results   Urine Tests    Routine Urinalysis   Glucose: negative   (Normal Range: Negative) Bilirubin: negative   (Normal Range: Negative) Ketone: negative   (Normal Range: Negative) Spec. Gravity: 1.010   (Normal Range: 1.003-1.035) Blood: negative   (Normal Range: Negative) pH: 7.0   (Normal Range: 5.0-8.0) Protein: negative   (Normal Range: Negative) Urobilinogen: 0.2   (Normal Range: 0-1) Nitrite: negative   (Normal Range: Negative) Leukocyte Esterace: moderate   (Normal Range: Negative)    Comments: Pt c/o feeling pressure in lower abdomen      Orders Added: 1)  Est. Patient Level I  [81191] 2)  UA Dipstick w/o Micro (manual) [81002]    ]

## 2010-10-20 NOTE — Letter (Signed)
[facility]   Imported By: Arta Bruce 05/17/2008 10:02:03  _____________________________________________________________________  External Attachment:    Type:   Image     Comment:   External Document

## 2010-10-20 NOTE — Miscellaneous (Signed)
Summary: Omeprazole Rx  Clinical Lists Changes  Medications: Removed medication of NEXIUM 40 MG  CPDR (ESOMEPRAZOLE MAGNESIUM) 1 tablet by mouth daily Added new medication of OMEPRAZOLE 20 MG  TBEC (OMEPRAZOLE) 1 tablet by mouth two times a day for stomach **Pharmacy - D/C nexium** - Signed Rx of OMEPRAZOLE 20 MG  TBEC (OMEPRAZOLE) 1 tablet by mouth two times a day for stomach **Pharmacy - D/C nexium**;  #60 x 3;  Signed;  Entered by: Lehman Prom FNP;  Authorized by: Lehman Prom FNP;  Method used: Electronic    Prescriptions: OMEPRAZOLE 20 MG  TBEC (OMEPRAZOLE) 1 tablet by mouth two times a day for stomach **Pharmacy - D/C nexium**  #60 x 3   Entered and Authorized by:   Lehman Prom FNP   Signed by:   Lehman Prom FNP on 04/11/2008   Method used:   Electronically sent to ...       [facility] [address] [phone]*       [address]       [place], [place]  [address]       Ph: [phone] or [phone]       Fax: [phone]   RxID:   [id number]

## 2010-10-20 NOTE — Letter (Signed)
Summary: [facility]  [facility]   Imported By: Arta Bruce 10/02/2009 14:43:44  _____________________________________________________________________  External Attachment:    Type:   Image     Comment:   External Document

## 2010-10-20 NOTE — Progress Notes (Signed)
Summary: Elevated BP  Phone Note Call from Patient Call back at [facility] Phone [phone]   Caller: Patient Summary of Call: The paitient is really very concerned because her blood pressure was 145/85 and she is going to have a breast surgery on Friday.  There is anyway she can be seen priorly her surgery. FNP [name] Initial call taken by: Manon Hilding,  February 26, 2008 10:10 AM  Follow-up for Phone Call        have pt come in for BP check.  she may need an increase in BP meds at least until after surgery. attempted to call pt but no answer. please try again on 02/29/08 Follow-up by: Lehman Prom FNP,  February 28, 2008 5:36 PM  Additional Follow-up for Phone Call Additional follow up Details #1::        pt will come in today for nurse visit at 10:10am.  Phone note complete Additional Follow-up by: Levon Hedger,  February 29, 2008 9:02 AM

## 2010-10-20 NOTE — Letter (Signed)
[facility].  [facility]   Imported By: Arta Bruce 01/01/2009 11:12:34  _____________________________________________________________________  External Attachment:    Type:   Image     Comment:   External Document

## 2010-10-20 NOTE — Assessment & Plan Note (Signed)
Summary: Acute - Right Otitis Media   Vital Signs:  Patient profile:   60 year old female Menstrual status:  partial hysterectomy Weight:      197.5 pounds BMI:     32.48 BSA:     1.98 Temp:     98.5 degrees F oral Pulse rate:   66 / minute Pulse rhythm:   regular Resp:     16 per minute BP sitting:   143 / 84  (left arm) Cuff size:   large  Vitals Entered By: Levon Hedger (Jan 22, 2010 4:20 PM) CC: right ear pain with itching and has been running a low grade fever...needs refill on Toprol, Hypertension Management Is Patient Diabetic? No Pain Assessment Patient in pain? yes     Location: ear Intensity: 6  Does patient need assistance? Functional Status Self care Ambulation Normal   CC:  right ear pain with itching and has been running a low grade fever...needs refill on Toprol and Hypertension Management.  History of Present Illness:  Pt into the office with complaints of right ear pain. Pt was into the office on last month for ear irrigation. Since that time she has been having problems with the right ear. +pain -discharge Left ear is doing well  Hypertension History:      She denies headache, chest pain, and palpitations.  Pt was ordered to start lisinopril/hctz on last visit. pt has not done that.  she has instead continued the hctz 25mg  and has started toprol 50mg  - 1/2 tablet by mouth two times a day .  Further comments include: presents today with BP log and it appears to be doing much better with above change in meds.        Positive major cardiovascular risk factors include female age 57 years old or older and hypertension.  Negative major cardiovascular risk factors include non-tobacco-user status.        Further assessment for target organ damage reveals no history of ASHD, cardiac end-organ damage (CHF/LVH), stroke/TIA, peripheral vascular disease, renal insufficiency, or hypertensive retinopathy.     Allergies (verified): 1)  ! Codeine 2)  !  Tetracycline 3)  ! Iodine 4)  ! Penicillin 5)  ! * Milk Products 6)  ! * Latex  Review of Systems ENT:  Complains of earache. CV:  Denies chest pain or discomfort. Resp:  Denies cough. GI:  Denies abdominal pain, loss of appetite, and nausea. GU:  Complains of urinary frequency.  Physical Exam  General:  alert.   Head:  normocephalic.   Ears:  Right eat - inflammed TM buldging Lungs:  normal breath sounds.   Heart:  normal rate and regular rhythm.   Msk:  normal ROM.   Neurologic:  alert & oriented X3.     Impression & Recommendations:  Problem # 1:  OTITIS MEDIA, RIGHT (ICD-382.9) Pt has allergy to PCN - will give bactrim Her updated medication list for this problem includes:    Meloxicam 15 Mg Tabs (Meloxicam) ..... One tablet by mouth daily for pain    Bactrim 400-80 Mg Tabs (Sulfamethoxazole-trimethoprim) ..... Onet tablet by mouth two times a day for infection  Problem # 2:  HYPERTENSION, BENIGN ESSENTIAL (ICD-401.1) BP log reviewed and is stable now ok for pt to continue current meds Her updated medication list for this problem includes:    Toprol Xl 50 Mg Tb24 (Metoprolol succinate) .Marland Kitchen... 1/2 tablet by mouth two times a day    Hydrochlorothiazide 25 Mg Tabs (Hydrochlorothiazide) .Marland KitchenMarland KitchenMarland KitchenMarland Kitchen  Take one (1) by mouth daily  Complete Medication List: 1)  Toprol Xl 50 Mg Tb24 (Metoprolol succinate) .... 1/2 tablet by mouth two times a day 2)  Nexium 40 Mg Cpdr (Esomeprazole magnesium) .... One tablet by mouth daily 3)  Tamoxifen Citrate 20 Mg Tabs (Tamoxifen citrate) .... One tablet by mouth daily 4)  Hydrochlorothiazide 25 Mg Tabs (Hydrochlorothiazide) .... Take one (1) by mouth daily 5)  Multivitamins Tabs (Multiple vitamin) .... One tablet by mouth daily 6)  Fish Oil 300 Mg Caps (Omega-3 fatty acids) .... One capsule by mouth daily 7)  Meloxicam 15 Mg Tabs (Meloxicam) .... One tablet by mouth daily for pain 8)  Miralax Powd (Polyethylene glycol 3350) .... Mix 1 capful  with 8 ounces of water/juice daily 9)  Bactrim 400-80 Mg Tabs (Sulfamethoxazole-trimethoprim) .... Onet tablet by mouth two times a day for infection  Hypertension Assessment/Plan:      The patient's hypertensive risk group is category B: At least one risk factor (excluding diabetes) with no target organ damage.  Her calculated 10 year risk of coronary heart disease is 13 %.  Today's blood pressure is 143/84.  Her blood pressure goal is < 140/90.  Patient Instructions: 1)  Constipation - you can take senokot over the counter twice daily  2)  OR you can get Metamucil fiber pills 3)  Right ear infection - You will need to take  Prescriptions: BACTRIM 400-80 MG TABS (SULFAMETHOXAZOLE-TRIMETHOPRIM) Onet tablet by mouth two times a day for infection  #20 x 0   Entered and Authorized by:   Lehman Prom FNP   Signed by:   Lehman Prom FNP on 01/22/2010   Method used:   Print then Give to Patient   RxID:   [id number]   Laboratory Results   Urine Tests    Routine Urinalysis   Color: lt. yellow Glucose: negative   (Normal Range: Negative) Bilirubin: negative   (Normal Range: Negative) Ketone: negative   (Normal Range: Negative) Spec. Gravity: 1.010   (Normal Range: 1.003-1.035) Blood: negative   (Normal Range: Negative) pH: 7.0   (Normal Range: 5.0-8.0) Protein: negative   (Normal Range: Negative) Urobilinogen: 0.2   (Normal Range: 0-1) Nitrite: negative   (Normal Range: Negative) Leukocyte Esterace: negative   (Normal Range: Negative)

## 2010-10-20 NOTE — Letter (Signed)
Summary: [facility] RIGHT BREAST/DR.PATRICK BALLEN  CARCINOMA OF RIGHT BREAST/DR.PATRICK BALLEN   Imported By: Arta Bruce 01/25/2008 11:09:04  _____________________________________________________________________  External Attachment:    Type:   Image     Comment:   External Document

## 2010-10-20 NOTE — Miscellaneous (Signed)
Summary: BMP  Clinical Lists Changes  Observations: Added new observation of CALCIUM: 9.8 mg/dL (78/46/9629 52:84) Added new observation of CREATININE: 0.70 mg/dL (13/24/4010 27:25) Added new observation of BUN: 8 mg/dL (36/64/4034 74:25) Added new observation of BG RANDOM: 89 mg/dL (95/63/8756 43:32) Added new observation of CO2 PLSM/SER: 27 meq/L (01/19/2008 13:54) Added new observation of CL SERUM: 108 meq/L (01/19/2008 13:54) Added new observation of K SERUM: 4.4 meq/L (01/19/2008 13:54) Added new observation of NA: 138 meq/L (01/19/2008 13:54)

## 2010-10-20 NOTE — Letter (Signed)
Summary: Handout Printed  Printed Handout:  - Lumpectomy and Axillary Lymph Node Dissection

## 2010-10-20 NOTE — Assessment & Plan Note (Signed)
Summary: 3629 PT/LUPUS FLRAING UP/HURT WHEN URINATING/KT   Vital Signs:  Patient profile:   60 year old female Weight:      210 pounds Temp:     97.9 degrees F Pulse rate:   66 / minute Pulse rhythm:   regular Resp:     18 per minute BP sitting:   113 / 73  (left arm) Cuff size:   large  Vitals Entered By: Vesta Mixer CMA (April 22, 2009 10:40 AM) CC: having pain all over and hurts when she urinated for 3 days Is Patient Diabetic? No Pain Assessment Patient in pain? yes     Location: all over Intensity: 10  Does patient need assistance? Ambulation Normal   CC:  having pain all over and hurts when she urinated for 3 days.  History of Present Illness: 1. History of Lupus:   Aches all over--all joints.  Pt. denies any knowledge of other organ damage.  States she has intermittent flares.  Was on Plaquenil years ago--late 1980s--but had to stop secondary to eye side effects.  States has received short courses of Prednisone for treatment in past as well.  Has had diagnosis since late 1970s.  Followed with a Rheumatologist while living in [place]--early 1990s.  Has not followed with a Rheumatologist since.  Does get swelling of MCP joints of hands, but not elsewhere.  Most joints are large and symptoms are symmetric.  Pt has been under treatment for Breast cancer and until recently, Lupus has been fairly asymptomatic for a while.  After reviewing chart, discussed with pt. whether the diagnosis of SLE had ever been called into question previously.  She gives a history of the diagnosis being made secondary to recurrent fevers and malar rash with joint complaints back in 1979. She has been told that labs have previously not supported the diagnosis.  Her old chart--containing a good amt from [place]--also does not show a positive ANA, significantly elevated Sed Rate, or findings on exam to support a true arthritis.  Has actually been intermittently treated (mainly in early 1990s) with Plaquenil.  Has  never developed any organ disfunction related to the diagnosis.  2.  Hurts to urinate--points to suprapubic and mid groin area--more like a pulling sensation, not burning.  No hematuria she can see.   Has had low grade fevers for past 2 days--100-101 orally.  Possibly some flank pain.  Current Medications (verified): 1)  Ibuprofen 800 Mg  Tabs (Ibuprofen) .Marland Kitchen.. 1 Tablet By Mouth Two Times A Day As Needed For Pain 2)  Toprol Xl 50 Mg  Tb24 (Metoprolol Succinate) .... One Tablet By Mouth Daily For Blood Pressure 3)  Hydrocodone-Acetaminophen 5-500 Mg  Tabs (Hydrocodone-Acetaminophen) .Marland Kitchen.. 1 Tablet By Mouth Two Times A Day As Needed  **rx Given After Procedure** 4)  Nexium 40 Mg Cpdr (Esomeprazole Magnesium) .... One Tablet By Mouth Daily 5)  Tamoxifen Citrate 20 Mg Tabs (Tamoxifen Citrate) .... One Tablet By Mouth Daily 6)  Hydrochlorothiazide 25 Mg Tabs (Hydrochlorothiazide) .... Take One (1) By Mouth Daily 7)  Multivitamins  Tabs (Multiple Vitamin) .... One Tablet By Mouth Daily 8)  Fish Oil 300 Mg Caps (Omega-3 Fatty Acids) .... One Capsule By Mouth Daily  Allergies (verified): 1)  ! Codeine 2)  ! Tetracycline 3)  ! Iodine 4)  ! Penicillin 5)  ! * Milk Products 6)  ! * Latex  Physical Exam  General:  Moves somewhat gingerly Lungs:  Normal respiratory effort, chest expands symmetrically. Lungs are  clear to auscultation, no crackles or wheezes. Heart:  Normal rate and regular rhythm. S1 and S2 normal without gallop, murmur, click, rub or other extra sounds. Msk:  Pt. subjectively complains of tenderness on palpation of MCP joints and PIP joints, though no obvious effusion, redness or synovial thickening.  Full ROM of joints--no effusion or tenderness of knee joint/joint line   Impression & Recommendations:  Problem # 1:  DYSURIA (ICD-788.1)  UA, however, appears normal. Will send for culture as pt's symptoms concerning  Orders: UA Dipstick w/o Micro (manual) (16109) T-Culture,  Urine ([id number])  Problem # 2:  LUPUS (ICD-710.0) Generalized and symmetric arthralgias without definitive findings on exam. Check labs Consider Rheum referral--not clear truly has SLE. Her updated medication list for this problem includes:    Ibuprofen 800 Mg Tabs (Ibuprofen) .Marland Kitchen... 1 tablet by mouth two times a day as needed for pain    Tamoxifen Citrate 20 Mg Tabs (Tamoxifen citrate) ..... One tablet by mouth daily  Orders: T-Antinuclear Antib (ANA) [phone]) T-Sed Rate (Automated) [phone]) T-C-Reactive Protein [phone])  Complete Medication List: 1)  Ibuprofen 800 Mg Tabs (Ibuprofen) .Marland Kitchen.. 1 tablet by mouth two times a day as needed for pain 2)  Toprol Xl 50 Mg Tb24 (Metoprolol succinate) .... One tablet by mouth daily for blood pressure 3)  Hydrocodone-acetaminophen 5-500 Mg Tabs (Hydrocodone-acetaminophen) .Marland Kitchen.. 1 tablet by mouth two times a day as needed  **rx given after procedure** 4)  Nexium 40 Mg Cpdr (Esomeprazole magnesium) .... One tablet by mouth daily 5)  Tamoxifen Citrate 20 Mg Tabs (Tamoxifen citrate) .... One tablet by mouth daily 6)  Hydrochlorothiazide 25 Mg Tabs (Hydrochlorothiazide) .... Take one (1) by mouth daily 7)  Multivitamins Tabs (Multiple vitamin) .... One tablet by mouth daily 8)  Fish Oil 300 Mg Caps (Omega-3 fatty acids) .... One capsule by mouth daily  Other Orders: T-CK Total [id number]  Patient Instructions: 1)  Push water. 2)  Take Ibuprofen every 8 hours as needed.  Laboratory Results   Urine Tests  Date/Time Received: April 22, 2009 12:14 PM  Date/Time Reported: April 22, 2009 12:14 PM   Routine Urinalysis   Appearance: Clear Glucose: negative   (Normal Range: Negative) Bilirubin: negative   (Normal Range: Negative) Ketone: negative   (Normal Range: Negative) Spec. Gravity: <1.005   (Normal Range: 1.003-1.035) Blood: negative   (Normal Range: Negative) pH: 7.0   (Normal Range: 5.0-8.0) Protein: negative    (Normal Range: Negative) Urobilinogen: 0.2   (Normal Range: 0-1) Nitrite: negative   (Normal Range: Negative) Leukocyte Esterace: negative   (Normal Range: Negative)

## 2010-10-20 NOTE — Letter (Signed)
Summary: [facility]/END OF TREATMENT NOTE  [facility]/END OF [facility]REATMENT NOTE   Imported By: Arta Bruce 09/03/2008 10:51:48  _____________________________________________________________________  External Attachment:    Type:   Image     Comment:   External Document

## 2010-10-20 NOTE — Letter (Signed)
Summary: Handout Printed  Printed Handout:  - Fiber in Your Diet

## 2010-10-20 NOTE — Assessment & Plan Note (Signed)
Summary: HTN/Lupus   Vital Signs:  Patient Profile:   60 Years Old Female Height:     65.50 inches Weight:      203 pounds BMI:     33.39 BSA:     2.00 Temp:     97.5 degrees F oral Pulse rate:   72 / minute Pulse rhythm:   regular Resp:     16 per minute BP sitting:   110 / 70  (left arm) Cuff size:   large  Pt. in pain?   yes    Location:   rt breast    Intensity:   8  Vitals Entered By: Levon Hedger (March 21, 2008 8:55 AM)              Is Patient Diabetic? No  Does patient need assistance? Ambulation Normal Comments pt states she was prescribed Ambien 5mg      Chief Complaint:  follow-up to renew medication/  hospital visit today.  History of Present Illness:  Pt into the office for f/u on blood pressure.  Pt has right breast carcinoma.  She had a lumpectomy on yesturday.  She reports that she did take the darcovet as ordered but that gave her gastritis so she quit taking.  She was given hydrocodone as needed for pain meds.  She is doing well with the pain meds.  Hx of lupus -she has low grade temps in the mornings.  For the past month her pain has been increased in her joints - hip, joints, shoulders, knees.  she notes that summer is the worst time of the year with joints.  Pt states that she has recieved a letter from [facility] that she needs RX for nexium to get meds.  Hypertension History:      Pt has been taking the metoprolol 50mg  - this is working well. She had been taking metoprolol 25mg  daily.        Positive major cardiovascular risk factors include female age 71 years old or older and hypertension.  Negative major cardiovascular risk factors include non-tobacco-user status.       Updated Prior Medication List: IBUPROFEN 800 MG  TABS (IBUPROFEN) 1 tablet by mouth two times a day as needed for pain METOPROLOL TARTRATE 25 MG  TABS (METOPROLOL TARTRATE) 1 tablet by mouth two times a day for blood pressure NEXIUM 40 MG  CPDR (ESOMEPRAZOLE MAGNESIUM) 1  tablet by mouth daily DARVOCET-N 100 100-650 MG  TABS (PROPOXYPHENE N-APAP) 1 tablet by mouth two times a day as needed for pain  Current Allergies: ! CODEINE ! TETRACYCLINE ! IODINE ! PENICILLIN ! * MILK PRODUCTS ! * LATEX    Risk Factors: Tobacco use:  never Drug use:  no Caffeine use:  1 drinks per day Alcohol use:  no Exercise:  no Seatbelt use:  100 % Sun Exposure:  occasionally  Colonoscopy History:    Date of Last Colonoscopy:  11/24/2007  Mammogram History:    Date of Last Mammogram:  12/07/2007  PAP Smear History:    Date of Last PAP Smear:  11/24/2007   Review of Systems  General      Complains of fever.  CV      Denies chest pain or discomfort.  Resp      Denies cough.  GI      Denies abdominal pain.  MS      Complains of joint pain.   Physical Exam  General:     alert.   Head:  normocephalic.   Mouth:     fair dentition.   Breasts:     Right breast s/p lumpectomy Lungs:     normal breath sounds.   Heart:     normal rate and regular rhythm.   Abdomen:     soft, non-tender, and normal bowel sounds.   Msk:     up to exam table no swelling in joints Neurologic:     alert & oriented X3.   Skin:     no rashes.   Psych:     Oriented X3.      Impression & Recommendations:  Problem # 1:  HYPERTENSION, BENIGN ESSENTIAL (ICD-401.1)  The following medications were removed from the medication list:    Metoprolol Tartrate 25 Mg Tabs (Metoprolol tartrate) .Marland Kitchen... 1 tablet by mouth two times a day for blood pressure  Her updated medication list for this problem includes:    Toprol Xl 50 Mg Tb24 (Metoprolol succinate) .Marland Kitchen... 1 tablet by mouth daily  **pharmacy d/c metoprolol 25mg **   Problem # 2:  CARCINOMA, BREAST, RIGHT (ICD-174.9) s/p lumpectomy on yesturday. pt to f/u with oncologist.  radiation to start in 6 weeks.  Problem # 3:  LUPUS (ICD-710.0) Will check labs. Her updated medication list for this problem includes:     Ibuprofen 800 Mg Tabs (Ibuprofen) .Marland Kitchen... 1 tablet by mouth two times a day as needed for pain  Orders: T-C-Reactive Protein [phone]) T-Antinuclear Antib (ANA) [phone]) T-Rheumatoid Factor [phone]) T-Sed Rate (Automated) [phone]) T-CBC w/Diff ([phone])   Problem # 4:  GERD (ICD-530.81) meds refilled for [facility] application Her updated medication list for this problem includes:    Nexium 40 Mg Cpdr (Esomeprazole magnesium) .Marland Kitchen... 1 tablet by mouth daily   Complete Medication List: 1)  Ibuprofen 800 Mg Tabs (Ibuprofen) .Marland Kitchen.. 1 tablet by mouth two times a day as needed for pain 2)  Nexium 40 Mg Cpdr (Esomeprazole magnesium) .Marland Kitchen.. 1 tablet by mouth daily 3)  Ambien 5 Mg Tabs (Zolpidem tartrate) 4)  Toprol Xl 50 Mg Tb24 (Metoprolol succinate) .Marland Kitchen.. 1 tablet by mouth daily  **pharmacy d/c metoprolol 25mg ** 5)  Hydrocodone-acetaminophen 5-500 Mg Tabs (Hydrocodone-acetaminophen) .Marland Kitchen.. 1 tablet by mouth two times a day as needed  **rx given after procedure**  Hypertension Assessment/Plan:      The patient's hypertensive risk group is category B: At least one risk factor (excluding diabetes) with no target organ damage.  Her calculated 10 year risk of coronary heart disease is 3 %.  Today's blood pressure is 110/70.  Her blood pressure goal is < 140/90.   Patient Instructions: 1)  Blood pressure is going well.  Continue curernt medications. 2)  Follow up here in 3 months for blood pressure or sooner if necessary.   Prescriptions: NEXIUM 40 MG  CPDR (ESOMEPRAZOLE MAGNESIUM) 1 tablet by mouth daily  #90 x 3   Entered and Authorized by:   Lehman Prom FNP   Signed by:   Lehman Prom FNP on 03/21/2008   Method used:   Print then Give to Patient   RxID:   [id number] TOPROL XL 50 MG  TB24 (METOPROLOL SUCCINATE) 1 tablet by mouth daily  **Pharmacy d/c metoprolol 25mg **  #30 x 3   Entered and Authorized by:   Lehman Prom FNP   Signed by:   Lehman Prom FNP on  03/21/2008   Method used:   Electronically sent to ...       [facility]  [address] [phone]*  [address]       [place], [place]  [address]       Ph: [phone] or [phone]       Fax: [phone]   RxID:   [id number]  ]

## 2010-10-20 NOTE — Letter (Signed)
Summary: PATIENT INFORMATION & HISTORY SHEET  PATIENT INFORMATION & HISTORY SHEET   Imported ByArta Bruce 11/08/2007 15:35:19  _____________________________________________________________________  External Attachment:    Type:   Image     Comment:   External Document

## 2010-10-20 NOTE — Progress Notes (Signed)
Summary: ?????  Phone Note Call from Patient   Summary of Call: Pt would like to speak with Ms. [name] regarding some symptoms she is having, would not say what they were. Initial call taken by: Vesta Mixer CMA,  August 25, 2009 4:03 PM  Follow-up for Phone Call        Out of office last week when message was taken. Attempted to call pt back today - left message for pt to call back and leave reason she is calling. Follow-up by: Lehman Prom FNP,  September 02, 2009 2:09 PM  Additional Follow-up for Phone Call Additional follow up Details #1::        pt seen in office today. phone note complete Additional Follow-up by: Lehman Prom FNP,  September 04, 2009 1:06 PM

## 2010-10-20 NOTE — Assessment & Plan Note (Signed)
Summary: Right Cerumen Impaction/HTN   Vital Signs:  Patient profile:   60 year old female Menstrual status:  partial hysterectomy Height:      65.5 inches Weight:      198 pounds BMI:     32.57 Temp:     98.0 degrees F oral Pulse rate:   70 / minute Pulse rhythm:   regular BP sitting:   143 / 90  (left arm) Cuff size:   large  Vitals Entered By: [place] Shannon (December 24, 2009 8:48 AM)  Nutrition Counseling: Patient's BMI is greater than 25 and therefore counseled on weight management options. CC: f/u on abd pain.... pt says the miralax worked.... pt says she needs right ear clean...., Hypertension Management, Lipid Management Is Patient Diabetic? No Pain Assessment Patient in pain? no       Does patient need assistance? Functional Status Self care Ambulation Normal   CC:  f/u on abd pain.... pt says the miralax worked.... pt says she needs right ear clean...., Hypertension Management, and Lipid Management.  History of Present Illness:  Pt into the office for f/u on abdominal pain. pt was seen 3 weeks ago for abdominal pain.  She did as ordered and got the miralax.  Bowel habits improved and abdominal pain has improved.  Obesity - pt has improved her exercise regimen in attempts to lose weight.  She has lost 2 pounds since rhe last visit.  Hypertension History:      She denies headache, chest pain, and palpitations.  She notes no problems with any antihypertensive medication side effects.        Positive major cardiovascular risk factors include female age 32 years old or older and hypertension.  Negative major cardiovascular risk factors include non-tobacco-user status.        Further assessment for target organ damage reveals no history of ASHD, cardiac end-organ damage (CHF/LVH), stroke/TIA, peripheral vascular disease, renal insufficiency, or hypertensive retinopathy.    Lipid Management History:      Positive NCEP/ATP III risk factors include female age 54 years old  or older, HDL cholesterol less than 40, and hypertension.  Negative NCEP/ATP III risk factors include non-tobacco-user status, no ASHD (atherosclerotic heart disease), no prior stroke/TIA, no peripheral vascular disease, and no history of aortic aneurysm.      Habits & Providers  Alcohol-Tobacco-Diet     Alcohol drinks/day: 0     Tobacco Status: never  Exercise-Depression-Behavior     Does Patient Exercise: yes     Exercise Counseling: to improve exercise regimen     Type of exercise: walking     Exercise (avg: min/session): <30     Have you felt down or hopeless? no     Have you felt little pleasure in things? no     Drug Use: no     Seat Belt Use: 100     Sun Exposure: occasionally  Current Medications (verified): 1)  Toprol Xl 50 Mg  Tb24 (Metoprolol Succinate) .... One Tablet By Mouth Daily For Blood Pressure 2)  Nexium 40 Mg Cpdr (Esomeprazole Magnesium) .... One Tablet By Mouth Daily 3)  Tamoxifen Citrate 20 Mg Tabs (Tamoxifen Citrate) .... One Tablet By Mouth Daily 4)  Hydrochlorothiazide 25 Mg Tabs (Hydrochlorothiazide) .... One Tablet By Mouth Daily 5)  Multivitamins  Tabs (Multiple Vitamin) .... One Tablet By Mouth Daily 6)  Fish Oil 300 Mg Caps (Omega-3 Fatty Acids) .... One Capsule By Mouth Daily 7)  Meloxicam 15 Mg Tabs (Meloxicam) .Marland KitchenMarland KitchenMarland Kitchen  One Tablet By Mouth Daily For Pain 8)  Miralax  Powd (Polyethylene Glycol 3350) .... Mix 1 Capful With 8 Ounces of Water/juice Daily  Allergies (verified): 1)  ! Codeine 2)  ! Tetracycline 3)  ! Iodine 4)  ! Penicillin 5)  ! * Milk Products 6)  ! * Latex  Social History: Does Patient Exercise:  yes  Review of Systems CV:  Denies chest pain or discomfort, swelling of feet, and swelling of hands. Resp:  Denies cough. GI:  Denies abdominal pain, nausea, and vomiting.  Physical Exam  General:  alert.   Head:  normocephalic.   Ears:  right ear with cerumen impacation  left ear - TM visible - no cerumen Mouth:  pharynx pink  and moist.   Lungs:  normal breath sounds.   Heart:  normal rate and regular rhythm.     Impression & Recommendations:  Problem # 1:  Hx of ABDOMINAL PAIN (ICD-789.00) improved pt to maintain bowel habits with fiber and miralax  Problem # 2:  CERUMEN IMPACTION, RIGHT (ICD-380.4)  right ear - irrigation doe pt tolerated well. large amount of cerumen removed  Orders: Cerumen Impaction Removal (16109)  Problem # 3:  OBESITY (ICD-278.00) encouraged pt to continue weight loss efforts  Problem # 4:  HYPERTENSION, BENIGN ESSENTIAL (ICD-401.1) will change bp meds to include zestoretic Her updated medication list for this problem includes:    Toprol Xl 50 Mg Tb24 (Metoprolol succinate) ..... One tablet by mouth daily for blood pressure    Zestoretic 10-12.5 Mg Tabs (Lisinopril-hydrochlorothiazide) ..... One tablet by mouth daily for blood pressure  Complete Medication List: 1)  Toprol Xl 50 Mg Tb24 (Metoprolol succinate) .... One tablet by mouth daily for blood pressure 2)  Nexium 40 Mg Cpdr (Esomeprazole magnesium) .... One tablet by mouth daily 3)  Tamoxifen Citrate 20 Mg Tabs (Tamoxifen citrate) .... One tablet by mouth daily 4)  Zestoretic 10-12.5 Mg Tabs (Lisinopril-hydrochlorothiazide) .... One tablet by mouth daily for blood pressure 5)  Multivitamins Tabs (Multiple vitamin) .... One tablet by mouth daily 6)  Fish Oil 300 Mg Caps (Omega-3 fatty acids) .... One capsule by mouth daily 7)  Meloxicam 15 Mg Tabs (Meloxicam) .... One tablet by mouth daily for pain 8)  Miralax Powd (Polyethylene glycol 3350) .... Mix 1 capful with 8 ounces of water/juice daily  Hypertension Assessment/Plan:      The patient's hypertensive risk group is category B: At least one risk factor (excluding diabetes) with no target organ damage.  Her calculated 10 year risk of coronary heart disease is 13 %.  Today's blood pressure is 143/90.  Her blood pressure goal is < 140/90.  Education Time: 5  Lipid  Assessment/Plan:      The patient's calculated 10 year coronary heart disease risk is 13 %.  Based on NCEP/ATP III, the patient's risk factor category is "2 or more risk factors and a calculated 10 year CAD risk of < 20%".  The patient's lipid goals are as follows: Total cholesterol goal is 200; LDL cholesterol goal is 130; HDL cholesterol goal is 40; Triglyceride goal is 150.    Patient Education:    Time in minutes spent on Education--videos/handouts: 5  Patient Instructions: 1)  Your blood pressure on last visit was 149/89 2)  Today blood pressure is 143/90 3)  It has been elevated at home as per your records. 4)  You need just a little bit better blood pressure control. 5)  Will change hctz  25mg  to lisinopril 10/12.5 - one tablet by mouth daily 6)  Continue to check your blood pressure at home at least 3 times per week for the next 2 weeks.  write down the values and bring to this office in 2 weeks.  i will review and and let you know if blood pressure is doing better Prescriptions: ZESTORETIC 10-12.5 MG TABS (LISINOPRIL-HYDROCHLOROTHIAZIDE) One tablet by mouth daily for blood pressure  #30 x 3   Entered and Authorized by:   Lehman Prom FNP   Signed by:   Lehman Prom FNP on 12/24/2009   Method used:   Print then Give to Patient   RxID:   [id number]

## 2010-10-20 NOTE — Progress Notes (Signed)
Summary: Yeast infection  Phone Note Call from Patient   Summary of Call: spoke with pt she states from taking the Macrobid she has a yeast infection and is asking if she could get something called into the pharmacy for that. [facility] [address] [phone] Initial call taken by: Levon Hedger,  November 29, 2007 10:25 AM  Follow-up for Phone Call        [facility]?  [facility] if she does not have [facility].  Clarify this with her.  She will have to pay regular price if she [facility]. Is she also aware that she needs to start the Cipro becuase of the urine culture results and again this will be very expensive [facility].   I will send diflucan - 2 tabs.  she can take 1 tablet when she gets the RX and the other when she finishes the antibiotic Follow-up by: Lehman Prom FNP,  November 29, 2007 10:31 AM  Additional Follow-up for Phone Call Additional follow up Details #1::        Phone Call Completed Additional Follow-up by: Levon Hedger,  November 30, 2007 2:20 PM    New/Updated Medications: DIFLUCAN 200 MG  TABS (FLUCONAZOLE) 1 tablet by mouth x 2 days   Prescriptions: DIFLUCAN 200 MG  TABS (FLUCONAZOLE) 1 tablet by mouth x 2 days  #2 x 0   Entered and Authorized by:   Lehman Prom FNP   Signed by:   Lehman Prom FNP on 11/29/2007   Method used:   Printed then faxed to ...       [facility]       [address] [place], [place]  [address]       Ph: [phone]       Fax: [phone]   RxID:   [id number]

## 2010-10-20 NOTE — Progress Notes (Signed)
Summary: Medication refill  Phone Note Refill Request   Refills Requested: Medication #1:  HYDROCHLOROTHIAZIDE 25 MG TABS Take one (1) by mouth daily  Medication #2:  TOPROL XL 50 MG  TB24 1/2 tablet by mouth two times a day pt says that her card expires today and she needs to get her medication filled.  She uses [facility] on [address].  she is asking if she could get the toprol in 25 mg tablets so that she can take two a day.  Initial call taken by: Levon Hedger,  March 12, 2010 12:08 PM  Follow-up for Phone Call        [facility] advise pt to check there metoprolol was changed to 25mg  by mouth two times a day  Follow-up by: Lehman Prom FNP,  March 12, 2010 12:43 PM  Additional Follow-up for Phone Call Additional follow up Details #1::        Left message on answering machine to return call. Dutch Quint RN  March 12, 2010 3:31 PM     Additional Follow-up for Phone Call Additional follow up Details #2::    Pt. notified.  Follow-up by: Gaylyn Cheers RN,  March 13, 2010 9:13 AM  New/Updated Medications: TOPROL XL 25 MG XR24H-TAB (METOPROLOL SUCCINATE) One tablet by mouth two times a day for blood pressure Prescriptions: HYDROCHLOROTHIAZIDE 25 MG TABS (HYDROCHLOROTHIAZIDE) Take one (1) by mouth daily  #30 x 5   Entered and Authorized by:   Lehman Prom FNP   Signed by:   Lehman Prom FNP on 03/12/2010   Method used:   Electronically to        [facility]  [phone]* (retail)       [address]       [place], [place]  [address]       Ph: [phone] or [phone]       Fax: [phone]   RxID:   [id number] TOPROL XL 25 MG XR24H-TAB (METOPROLOL SUCCINATE) One tablet by mouth two times a day for blood pressure  #60 x 5   Entered and Authorized by:   Lehman Prom FNP   Signed by:   Lehman Prom FNP on 03/12/2010   Method used:   Electronically to        [facility]  [phone]*  (retail)       [address]       [place], [place]  [address]       Ph: [phone] or [phone]       Fax: [phone]   RxID:   [id number]

## 2010-10-20 NOTE — Letter (Signed)
[facility]   Imported By: Arta Bruce 10/11/2008 11:08:09  _____________________________________________________________________  External Attachment:    Type:   Image     Comment:   External Document

## 2010-10-20 NOTE — Letter (Signed)
Summary: [facility]/OFFICE NOTE  [facility]   Imported By: Arta Bruce 04/02/2009 10:57:20  _____________________________________________________________________  External Attachment:    Type:   Image     Comment:   External Document

## 2010-10-20 NOTE — Assessment & Plan Note (Signed)
Summary: HTN   Vital Signs:  Patient profile:   60 year old female Weight:      205.8 pounds BMI:     33.85 BSA:     2.02 Temp:     97.9 degrees F oral Pulse rate:   79 / minute Pulse rhythm:   regular Resp:     20 per minute BP sitting:   112 / 76  (left arm) Cuff size:   large  Vitals Entered By: Levon Hedger (June 27, 2009 11:01 AM) CC: lab results...leg pain constantly, Hypertension Management Pain Assessment Patient in pain? yes     Location: legs Intensity: 6  Does patient need assistance? Functional Status Self care Ambulation Normal Comments pt states when she talks to her family long distance they ask her why is she breathing so heavy and she wants to ask why is that happening.   CC:  lab results...leg pain constantly and Hypertension Management.  History of Present Illness:  Pt into the office for follow up on labs done during August. Needs referral to rheumatology.  Reports hx of lupus when establishing with this office.  No current rheumatologist  Labored breathing - family members talk to the pt and described that she is out of breath.  Pt does feel labored when walking but when she is taking she does not feel congested.  Mammogram - Pt needs her 6 month follow up.  She has ongoing f/u with the cancer center. (Called to schedule mammogram and she does not need f/u until March 2011)  Immunization - ? about the zostavax, she does have a hx of chicken pox   Hypertension History:      She denies headache, chest pain, and palpitations.  She notes no problems with any antihypertensive medication side effects.        Positive major cardiovascular risk factors include female age 84 years old or older and hypertension.  Negative major cardiovascular risk factors include non-tobacco-user status.        Further assessment for target organ damage reveals no history of ASHD, cardiac end-organ damage (CHF/LVH), stroke/TIA, peripheral vascular disease, renal  insufficiency, or hypertensive retinopathy.     Allergies (verified): 1)  ! Codeine 2)  ! Tetracycline 3)  ! Iodine 4)  ! Penicillin 5)  ! * Milk Products 6)  ! * Latex  Review of Systems CV:  Denies chest pain or discomfort. Resp:  Denies cough. GI:  Denies abdominal pain, nausea, and vomiting. MS:  Complains of joint pain and stiffness; stilffness in the knees and hands - darvocet does help with pain.  Physical Exam  General:  alert.   Head:  normocephalic.   Lungs:  normal breath sounds.   Heart:  normal rate and regular rhythm.   Msk:  Tenderness to  PIP joints, though no obvious effusion, redness or synovial thickening.  Full ROM of joints Neurologic:  alert & oriented X3.     Impression & Recommendations:  Problem # 1:  LUPUS (ICD-710.0) Will refer to rheumatology Her updated medication list for this problem includes:    Ibuprofen 800 Mg Tabs (Ibuprofen) .Marland Kitchen... 1 tablet by mouth two times a day as needed for pain    Tamoxifen Citrate 20 Mg Tabs (Tamoxifen citrate) ..... One tablet by mouth daily  Orders: Rheumatology Referral (Rheumatology)  Problem # 2:  ARTHRALGIA (ICD-719.40)  Problem # 3:  HYPERTENSION, BENIGN ESSENTIAL (ICD-401.1)  Her updated medication list for this problem includes:    Toprol  Xl 50 Mg Tb24 (Metoprolol succinate) ..... One tablet by mouth daily for blood pressure    Hydrochlorothiazide 25 Mg Tabs (Hydrochlorothiazide) ..... Hold  Problem # 4:  NEED PROPHYLACTIC VACCINATION&INOCULATION FLU (ICD-V04.81) indication: HTN  Complete Medication List: 1)  Ibuprofen 800 Mg Tabs (Ibuprofen) .Marland Kitchen.. 1 tablet by mouth two times a day as needed for pain 2)  Toprol Xl 50 Mg Tb24 (Metoprolol succinate) .... One tablet by mouth daily for blood pressure 3)  Nexium 40 Mg Cpdr (Esomeprazole magnesium) .... One tablet by mouth daily 4)  Tamoxifen Citrate 20 Mg Tabs (Tamoxifen citrate) .... One tablet by mouth daily 5)  Hydrochlorothiazide 25 Mg Tabs  (Hydrochlorothiazide) .... Hold 6)  Multivitamins Tabs (Multiple vitamin) .... One tablet by mouth daily 7)  Fish Oil 300 Mg Caps (Omega-3 fatty acids) .... One capsule by mouth daily 8)  Darvocet-n 100 100-650 Mg Tabs (Propoxyphene n-apap) .... One tablet by mouth daily as needed for pain 9)  Zostavax 13086 Unt/0.69ml Solr (Zoster vaccine live) .... 0.24ml subcutaneously for 1 dose  Other Orders: Flu Vaccine 75yrs + (57846) Admin 1st Vaccine (96295) Admin 1st Vaccine [facility]) [phone])  Hypertension Assessment/Plan:      The patient's hypertensive risk group is category B: At least one risk factor (excluding diabetes) with no target organ damage.  Her calculated 10 year risk of coronary heart disease is 4 %.  Today's blood pressure is 112/76.  Her blood pressure goal is < 140/90.  Patient Instructions: 1)  You will be referred to [facility].  You will be notified of the time/date of the appointment. 2)  Taper off HCTZ and continue toprol 3)  You will be scheuduled for mammogram 4)  Follow up in 2 months.  Come fasting to this appointment for labs. Prescriptions: ZOSTAVAX 44010 UNT/0.65ML SOLR (ZOSTER VACCINE LIVE) 0.15ml subcutaneously for 1 dose  #1 x 0   Entered and Authorized by:   Lehman Prom FNP   Signed by:   Lehman Prom FNP on 06/27/2009   Method used:   Print then Give to Patient   RxID:   [id number] DARVOCET-N 100 100-650 MG TABS (PROPOXYPHENE N-APAP) One tablet by mouth daily as needed for pain  #30 x 0   Entered and Authorized by:   Lehman Prom FNP   Signed by:   Lehman Prom FNP on 06/27/2009   Method used:   Print then Give to Patient   RxID:   [id number]    Influenza Vaccine    Vaccine Type: Fluvax 3+    Site: left deltoid    Mfr: Sanofi Pasteur    Dose: 0.5 ml    Route: IM    Given by: Levon Hedger    Exp. Date: 03/19/2010    Lot #: [id number]    VIS given: 04/13/07 version given June 27, 2009.  Flu Vaccine Consent  Questions    Do you have a history of severe allergic reactions to this vaccine? no    Any prior history of allergic reactions to egg and/or gelatin? no    Do you have a sensitivity to the preservative Thimersol? no    Do you have a past history of Guillan-Barre Syndrome? no    Do you currently have an acute febrile illness? no    Have you ever had a severe reaction to latex? no    Vaccine information given and explained to patient? yes    Are you currently pregnant? no    ndc  [phone] 

## 2010-11-09 ENCOUNTER — Other Ambulatory Visit: Payer: Self-pay | Admitting: Oncology

## 2010-11-09 ENCOUNTER — Encounter (HOSPITAL_BASED_OUTPATIENT_CLINIC_OR_DEPARTMENT_OTHER): Payer: BC Managed Care – HMO | Admitting: Oncology

## 2010-11-09 ENCOUNTER — Emergency Department (HOSPITAL_COMMUNITY)
Admission: EM | Admit: 2010-11-09 | Discharge: 2010-11-09 | Disposition: A | Discharge status: Home / Self Care | Payer: Medicare Other | Attending: Emergency Medicine | Admitting: Emergency Medicine

## 2010-11-09 ENCOUNTER — Emergency Department (HOSPITAL_COMMUNITY): Payer: Medicare Other

## 2010-11-09 DIAGNOSIS — R109 Unspecified abdominal pain: Secondary | ICD-10-CM | POA: Insufficient documentation

## 2010-11-09 DIAGNOSIS — C50419 Malignant neoplasm of upper-outer quadrant of unspecified female breast: Secondary | ICD-10-CM

## 2010-11-09 DIAGNOSIS — C50919 Malignant neoplasm of unspecified site of unspecified female breast: Secondary | ICD-10-CM | POA: Insufficient documentation

## 2010-11-09 DIAGNOSIS — M329 Systemic lupus erythematosus, unspecified: Secondary | ICD-10-CM | POA: Insufficient documentation

## 2010-11-09 DIAGNOSIS — R112 Nausea with vomiting, unspecified: Secondary | ICD-10-CM | POA: Insufficient documentation

## 2010-11-09 DIAGNOSIS — Z170 Estrogen receptor positive status [ER+]: Secondary | ICD-10-CM

## 2010-11-09 DIAGNOSIS — R197 Diarrhea, unspecified: Secondary | ICD-10-CM | POA: Insufficient documentation

## 2010-11-09 DIAGNOSIS — I10 Essential (primary) hypertension: Secondary | ICD-10-CM | POA: Insufficient documentation

## 2010-11-09 LAB — CBC WITH DIFFERENTIAL/PLATELET
BASO%: 0.3 % (ref 0.0–2.0)
Basophils Absolute: 0 10*3/uL (ref 0.0–0.1)
EOS%: 2.3 % (ref 0.0–7.0)
Eosinophils Absolute: 0.1 10*3/uL (ref 0.0–0.5)
HCT: 39 % (ref 34.8–46.6)
HGB: 13.3 g/dL (ref 11.6–15.9)
LYMPH%: 22.4 % (ref 14.0–49.7)
MCH: 30.4 pg (ref 25.1–34.0)
MCHC: 34.1 g/dL (ref 31.5–36.0)
MCV: 89 fL (ref 79.5–101.0)
MONO#: 0.7 10*3/uL (ref 0.1–0.9)
MONO%: 17.5 % — ABNORMAL HIGH (ref 0.0–14.0)
NEUT#: 2.2 10*3/uL (ref 1.5–6.5)
NEUT%: 57.5 % (ref 38.4–76.8)
Platelets: 190 10*3/uL (ref 145–400)
RBC: 4.38 10*6/uL (ref 3.70–5.45)
RDW: 12.8 % (ref 11.2–14.5)
WBC: 3.9 10*3/uL (ref 3.9–10.3)
lymph#: 0.9 10*3/uL (ref 0.9–3.3)

## 2010-11-09 LAB — URINALYSIS, ROUTINE W REFLEX MICROSCOPIC
Bilirubin Urine: NEGATIVE
Hgb urine dipstick: NEGATIVE
Ketones, ur: NEGATIVE mg/dL
Nitrite: NEGATIVE
Protein, ur: NEGATIVE mg/dL
Specific Gravity, Urine: 1.021 (ref 1.005–1.030)
Urine Glucose, Fasting: NEGATIVE mg/dL
Urobilinogen, UA: 0.2 mg/dL (ref 0.0–1.0)
pH: 7 (ref 5.0–8.0)

## 2010-11-09 LAB — COMPREHENSIVE METABOLIC PANEL
ALT: 20 U/L (ref 0–35)
AST: 21 U/L (ref 0–37)
Albumin: 3.8 g/dL (ref 3.5–5.2)
Alkaline Phosphatase: 52 U/L (ref 39–117)
BUN: 10 mg/dL (ref 6–23)
CO2: 26 mEq/L (ref 19–32)
Calcium: 9.1 mg/dL (ref 8.4–10.5)
Chloride: 102 mEq/L (ref 96–112)
Creatinine, Ser: 0.79 mg/dL (ref 0.40–1.20)
Glucose, Bld: 106 mg/dL — ABNORMAL HIGH (ref 70–99)
Potassium: 3.9 mEq/L (ref 3.5–5.3)
Sodium: 141 mEq/L (ref 135–145)
Total Bilirubin: 0.2 mg/dL — ABNORMAL LOW (ref 0.3–1.2)
Total Protein: 6.6 g/dL (ref 6.0–8.3)

## 2010-11-09 LAB — CANCER ANTIGEN 27.29: CA 27.29: 16 U/mL (ref 0–39)

## 2010-11-10 ENCOUNTER — Other Ambulatory Visit (HOSPITAL_COMMUNITY): Payer: BC Managed Care – HMO

## 2010-12-29 ENCOUNTER — Other Ambulatory Visit: Payer: Self-pay | Admitting: Internal Medicine

## 2010-12-29 DIAGNOSIS — Z1231 Encounter for screening mammogram for malignant neoplasm of breast: Secondary | ICD-10-CM

## 2010-12-29 LAB — URINALYSIS, ROUTINE W REFLEX MICROSCOPIC
Bilirubin Urine: NEGATIVE
Glucose, UA: NEGATIVE mg/dL
Hgb urine dipstick: NEGATIVE
Ketones, ur: NEGATIVE mg/dL
Nitrite: NEGATIVE
Protein, ur: NEGATIVE mg/dL
Specific Gravity, Urine: 1.015 (ref 1.005–1.030)
Urobilinogen, UA: 0.2 mg/dL (ref 0.0–1.0)
pH: 7 (ref 5.0–8.0)

## 2010-12-29 LAB — COMPREHENSIVE METABOLIC PANEL
ALT: 20 U/L (ref 0–35)
AST: 25 U/L (ref 0–37)
Albumin: 3.3 g/dL — ABNORMAL LOW (ref 3.5–5.2)
Alkaline Phosphatase: 84 U/L (ref 39–117)
BUN: 13 mg/dL (ref 6–23)
CO2: 28 mEq/L (ref 19–32)
Calcium: 9.4 mg/dL (ref 8.4–10.5)
Chloride: 101 mEq/L (ref 96–112)
Creatinine, Ser: 0.77 mg/dL (ref 0.4–1.2)
GFR calc Af Amer: >60 mL/min (ref >60)
GFR calc non Af Amer: >60 mL/min (ref >60)
Glucose, Bld: 96 mg/dL (ref 70–99)
Potassium: 3.6 mEq/L (ref 3.5–5.1)
Sodium: 137 mEq/L (ref 135–145)
Total Bilirubin: 0.4 mg/dL (ref 0.3–1.2)
Total Protein: 7 g/dL (ref 6.0–8.3)

## 2010-12-29 LAB — DIFFERENTIAL
Basophils Absolute: 0 10*3/uL (ref 0.0–0.1)
Basophils Relative: 0 % (ref 0–1)
Eosinophils Absolute: 0.1 10*3/uL (ref 0.0–0.7)
Eosinophils Relative: 2 % (ref 0–5)
Lymphocytes Relative: 38 % (ref 12–46)
Lymphs Abs: 1.7 10*3/uL (ref 0.7–4.0)
Monocytes Absolute: 0.6 10*3/uL (ref 0.1–1.0)
Monocytes Relative: 14 % — ABNORMAL HIGH (ref 3–12)
Neutro Abs: 2 10*3/uL (ref 1.7–7.7)
Neutrophils Relative %: 46 % (ref 43–77)

## 2010-12-29 LAB — CBC
HCT: 38.3 % (ref 36.0–46.0)
Hemoglobin: 12.5 g/dL (ref 12.0–15.0)
MCHC: 32.8 g/dL (ref 30.0–36.0)
MCV: 93.2 fL (ref 78.0–100.0)
Platelets: 190 10*3/uL (ref 150–400)
RBC: 4.11 MIL/uL (ref 3.87–5.11)
RDW: 13.4 % (ref 11.5–15.5)
WBC: 4.4 10*3/uL (ref 4.0–10.5)

## 2010-12-29 LAB — CK TOTAL AND CKMB (NOT AT ARMC)
CK, MB: 2.1 ng/mL (ref 0.3–4.0)
Relative Index: 1.3 (ref 0.0–2.5)
Total CK: 160 U/L (ref 7–177)

## 2010-12-29 LAB — TROPONIN I: Troponin I: <0.01 ng/mL (ref 0.00–0.06)

## 2010-12-29 LAB — LIPASE, BLOOD: Lipase: 20 U/L (ref 11–59)

## 2010-12-31 ENCOUNTER — Ambulatory Visit
Admission: RE | Admit: 2010-12-31 | Discharge: 2010-12-31 | Disposition: A | Discharge status: Home / Self Care | Payer: Self-pay | Source: Ambulatory Visit | Attending: Internal Medicine | Admitting: Internal Medicine

## 2010-12-31 DIAGNOSIS — Z1231 Encounter for screening mammogram for malignant neoplasm of breast: Secondary | ICD-10-CM

## 2010-12-31 DIAGNOSIS — Z9889 Other specified postprocedural states: Secondary | ICD-10-CM
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
## 2011-02-02 NOTE — Op Note (Signed)
[name], [name] NO.:  [id number]   MEDICAL RECORD NO.:  [id number]          PATIENT TYPE:  AMB   LOCATION:  DSC                          FACILITY:  [facility]   PHYSICIAN:  Leonie Man, M.D.   DATE OF BIRTH:  1950/11/07   DATE OF PROCEDURE:  04/17/2008  DATE OF DISCHARGE:                               OPERATIVE REPORT   PREOPERATIVE DIAGNOSIS:  Carcinoma, right breast, status post lumpectomy  for sentinel lymph node biopsy.   POSTOPERATIVE DIAGNOSIS:  Carcinoma, right breast, status post  lumpectomy for sentinel lymph node biopsy.   PROCEDURE:  Right sentinel lymph node biopsy and blue dye injection.   SURGEON:  Leonie Man, M.D.   ASSISTANT:  OR nurse.   ANESTHESIA:  General.   [name] is a 60 year old patient with a very strong family  history of breast cancer.  [name] is BRCA1 and BRCA2 negative.  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  This area of carcinoma occupied approximately  4.7 cm in size in greatest diameter on the mammogram.  At patient's  insistence and request, we went ahead with a lumpectomy for her.  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  She is returning to the  operating room now for a blue dye injection and sentinel lymph node  biopsy with the possibility of axillary lymph node dissection.  The  risks and potential benefits of surgery have been fully discussed with  the patient.  All questions answered and consent obtained.   With the patient positioned supinely, the left breast was prepped and  draped to be included in the sterile operative field.  Time-out to  identify the patient is a [name] and the procedures to be done  as left-sided sentinel lymph node biopsy was carried out.  All of the  safety precautions were observed.  I made a transverse incision  in the  axilla deepening this through the skin and subcutaneous tissues and with  the use of the NeoProbe, I found an area of increased radioactive  uptake.  In dissecting this area, I found a large solitary blue lymph  node, which was forwarded for pathologic evaluation.  The touch prep on  this node was returned as negative.  The wound was then irrigated.  The  subcutaneous tissues closed with a 3-0 Vicryl after sponge, instruments,  and sharp counts were verified.  The skin was closed with running 5-0  Monocryl and reinforced with Dermabond and Steri-Strips.  A sterile  dressing applied.  Anesthetic reversed.  The patient removed from the  operating room to the recovery room in stable condition.  She tolerated  the procedure well.      Leonie Man, M.D.  Electronically Signed    PB/MEDQ  D:  04/17/2008  T:  04/18/2008  Job:  [id number]
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
## 2011-02-02 NOTE — Op Note (Signed)
NAMEMIEKO, KNEEBONE NO.:  [id number]   MEDICAL RECORD NO.:  [id number]          PATIENT TYPE:  AMB   LOCATION:  DSC                          FACILITY:  [facility]   PHYSICIAN:  Leonie Man, M.D.   DATE OF BIRTH:  02/10/1951   DATE OF PROCEDURE:  03/20/2008  DATE OF DISCHARGE:                               OPERATIVE REPORT   PREOPERATIVE DIAGNOSIS:  Carcinoma, right breast.   POSTOPERATIVE DIAGNOSIS:  Carcinoma, right breast.   PROCEDURE:  Needle localized lumpectomy, right breast.   SURGEON:  Leonie Man, MD   ASSISTANT:  OR nurse.   ANESTHESIA:  General.   SPECIMENS TO LAB:  Breast tissue with a long suture at the medial margin  and double suture at the superior margin.   ESTIMATED BLOOD LOSS:  Minimal.   COMPLICATIONS:  None.  The patient returned to the PACU in excellent  condition.   HISTORY OF PRESENT ILLNESS:  [name] is a 60 year old female  with a diagnosed micropapillary DCIS lesion which has involved in an  area of microcalcifications within the upper portion of the right  breast.  This patient has a very strong family history of breast cancer.  She; however, underwent genetic testing and is negative for BRCA1 and  BRCA2 gene mutations.  She comes to the operating room now for a needle  localized excision of the area of calcifications of the right breast.   PROCEDURE:  Following the induction of satisfactory general anesthesia  with the patient positioned supinely, the right breast is prepped and  draped to be included in a sterile operative field.  The patient is  identified as [name] and the operation to be done is the needle  localized excision of a right breast lesion.   On inspection, there were 3 Kopan's needles which bracketed the area of  microcalcifications.  I made a transverse incision between the Kopan's  needles, deepening the incision down through the skin and into the  subcutaneous tissues, superior  flap was raised up to the more superior  of the 2 wires and these were dissected free from under the flap and  brought into the main incision.  Similarly, a dissection was carried  inferiorly down to the Kopan's wire more inferiorly in the breast and  this was also brought into the main incision.  I took a wide margin of  tissue skirting around the outside of the wires, carrying the dissection  down, straight down to the chest wall on all sides and dissecting the  specimen off of the pectoralis major muscle.  The specimen was then  marked with a long suture placed medially and a double suture placed  superiorly The excised breast tissue then was x-rayed.  The x-ray was  forwarded to the Radiology Department.  The clip and wires were clearly  seen within the breast specimen.   All areas of dissection were then checked for hemostasis.  Sponge and  instrument counts were doubly verified.  The wound was thoroughly  irrigated with normal saline and these breast tissues were  reapproximated using interrupted 2-0 Vicryl  sutures.  Subcutaneous  tissues closed with interrupted 3-0 Vicryl sutures and the skin closed  with 5-0 Monocryl suture.  The skin was then reinforced with Dermabond  and Steri-Strips.  The anesthetic reversed and the patient removed from  the operating room to the recovery room in stable condition.  She  tolerated the procedure well.      Leonie Man, M.D.  Electronically Signed     PB/MEDQ  D:  03/20/2008  T:  03/21/2008  Job:  [id number]

## 2011-02-05 NOTE — Discharge Summary (Signed)
[name], URY NO.:  [id number]   MEDICAL RECORD NO.:  [id number]          PATIENT TYPE:  INP   LOCATION:  5012                         FACILITY:  [facility]   PHYSICIAN:  Feliberto Gottron. Turner Daniels, M.D.   DATE OF BIRTH:  Nov 11, 1950   DATE OF ADMISSION:  01/27/2005  DATE OF DISCHARGE:  02/01/2005                                 DISCHARGE SUMMARY   PRIMARY DIAGNOSIS FOR THIS ADMISSION:  End-stage degenerative joint disease  left knee.   DISCHARGE SUMMARY:  The patient is a 60 year old woman who followed up with  Dr. Turner Daniels for many years with end stage arthritis of both knees.  She has  had arthroscopy, physical therapy and inflammatory medications and cortisone  injections.  On arthroscopy, she had some bare bone arthritic changes lat  compartment, as well as, on the medial compartment of the left knee with 50%  pain increasing contracture into flexion.  She desires elective left total  knee replacement.  The risks and benefits were discussed with the patient.  She does have a Latex allergy.  Risks and benefits were discussed at length.  The patient also is allergic to penicillin, iodine and tetracycline.   PAST MEDICAL HISTORY:  Usual childhood diseases.  No other history  significant for hepatitis many years ago.   PAST SURGICAL HISTORY:  1.  Left knee scope 2004.  2.  Hysterectomy 1986.  3.  Appendectomy/tubal ligation 1979.  4.  No difficulty with GET other than slow to wake.   SOCIAL HISTORY:  No tobacco, no ethanol, no IV drug abuse.  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   FAMILY HISTORY:  Mother died at age 59 with positive CVA and cardiac  disease, hypertension and cancer.  Father died at age 45 with positive CVA,  cardiac disease, hypertension and DJD.   REVIEW OF SYSTEMS:  Positive glasses and perimenopausal symptoms.  Patient  denies shortness of breath, chest pain or recent illness.   PHYSICAL EXAMINATION:  VITAL SIGNS:  Temperature 97.5, pulse 72,  respirations 20, blood pressure 160/94.  She is a 5 foot 8 inches, 195 pound  female.  She does have upper dentures.  HEENT:  Normocephalic, atraumatic.  Ears:  TM's are clear.  Eyes:  Pupils  equal, round and reactive to light and accommodation.  Nose patent, throat  clear.  NECK:  Moves freely.  CHEST:  Clear to auscultation and percussion.  HEART:  Regular rate and rhythm.  ABDOMEN:  Soft, nontender, no masses.  EXTREMITIES:  Left knee range of motion 15-80 degrees with trace effusion,  positive crepitus, stable ligamentous.  NEUROLOGICAL:  Intact.  SKIN:  Within normal limits.   PREOPERATIVE LABORATORIES:  CBC, C-MET, chest x-ray, EKG, PT/PTT all within  normal limits.  X-rays do show end-stage DJD in the left knee with positive  changes including squaring off osteophytes.   HOSPITAL COURSE:  On the day of admission, the patient was taken to the  operating room at [facility] where she underwent a left total knee  arthroplasty __________  RP components, #3 finger, #3 tibia, a 10 RP  spacer  and a 38 mm patellar button.  Medium Hemovac drain was placed double arm.  The patient was placed on perioperative antibiotics.  She was placed on  postoperative Coumadin prophylaxis with target INR of 1.5 to 2.  CPM was  begun in the recovery room.  She was placed on Dilaudid PCA for pain.  Postop day #1, the patient was complaining of a lot of pain and nausea,  taking p.o.'s without difficulty.  She was afebrile.  Hemoglobin 10.4, WBC  6.7, INR 1.1, urine output 150 cc per shift, urine output 450-200 cc per  shift.  Dressing was dry.  Hemovac was quiescent and discontinued.  Abdomen  soft and tenderness.  She was neurovascularly intact to light touch and  motors.  Foley was discontinued as well.  She will continue physical  therapy.   __________, the patient as awake and alert.  Moderate nausea and vomiting  with positive pain.  T-max 102 ranging 100.7, hemoglobin 9.9, dressing was  dry.   Neurovascularly intact.  She is moving her foot well and could bend  down almost 45 degrees.  She continued with physical therapy.   On postoperative day #3, the patient was feeling achy, but taking p.o.'s and  voiding without difficulty.  T-max 102.1.  She was otherwise, afebrile at  the time of exam.  Hemoglobin 9.2, INR 2.5, dressing clean and dry.  Calf  was soft.  She was neurovascularly intact.  She continued with mobilization.  On postoperative day #4, the patient was awake and alert, walking in the  hallway and up and down the steps.  Still somewhat weak.  Vital signs are  stable.  She is afebrile.  Wounds clean and dry.  Hemoglobin 9.2.  INR 2.6.  Her discharge was planned for the following day.   Postoperative day #5, the patient was complaining or red, sore area on her  left thigh.  Other ones without complaint.  She was afebrile.  Vital signs  were stable.  Hemoglobin 3.4.  Dressing was dry.  Wounds benign.  They are  just above the dressing on her inner thigh.  She had no evidence on flexion  of breakdown in the skin.  Minimally positive erythema.  Seemed to correlate  with the ACE wrap and could be related to irritation from the dressing.  Abdomen was soft and tender.  She was otherwise stable and discharged home  in the care of her family, and Keflex was prescribed because of the mild  cellulitis on the thigh.  She will be on the following medications as she  heads home.   DISCHARGE MEDICATIONS:  1.  Keflex 500 mg t.i.d.  2.  Coumadin 2 mg one p.o. daily or as directed.  3.  Percocet 5 mg 1-2 p.o. q.4h. p.r.n. pain.   DISCHARGE INSTRUCTIONS:  Activities:  Weightbearing as tolerated with  walker.  CPM five hours per day with increase of 5-10 degrees per day.  Dressing changes p.r.n.  She should return to the clinic in approximately  one weeks time or sooner if she is having difficulties.  Diet is regular. The patient should restart her new home medications as early as  tomorrow.      JBR/MEDQ  D:  04/22/2005  T:  04/22/2005  Job:  [id number]

## 2011-02-05 NOTE — Group Therapy Note (Signed)
NAME:  [name], [name] NO.:  [id number]   MEDICAL RECORD NO.:  [id number]          PATIENT TYPE:  WOC   LOCATION:  [facility]                   FACILITY:  [facility]   PHYSICIAN:  Tinnie Gens, MD        DATE OF BIRTH:  08/12/1951   DATE OF SERVICE:  11/25/2005                                    CLINIC NOTE   CHIEF COMPLAINT:  Follow-up MAU referral.   HISTORY OF PRESENT ILLNESS:  Patient is a 60 year old gravida 2, para 2-0-0-  3 who was previously seen for some probable atrophic vaginitis in the [facility].  She was treated for yeast and BV and reports that her symptoms are better.  The patient complains today of some suprapubic pain and some back pain she  thinks is related to a UTI.  She complains of dysuria and urgency.  She  denies fevers, chills, nausea, and vomiting.   PAST MEDICAL HISTORY:  1.  Lupus.  2.  Arthritis.  3.  History of hepatitis with jaundice.  4.  Ulcer.  5.  Heart murmur.   PAST SURGICAL HISTORY:  1.  Gallbladder removed in 1973.  2.  Appendix.  3.  Tubal ligation 1979.  4.  Partial hysterectomy 1986.  5.  Lumpectomy 1998.  6.  Total knee 2006.   MEDICATIONS:  Hydrocodone p.r.n.   ALLERGIES:  PENICILLIN, TETRACYCLINE, LATEX, IODINE.   FAMILY HISTORY:  Diabetes, heart disease, hypertension, and breast cancer.   SOCIAL HISTORY:  No tobacco, alcohol, or drug use.   OB HISTORY:  G3, P2, two vaginal deliveries, one set of twins.  She also has  an adopted child.   GYN HISTORY:  Patient has been menopausal since 1986 and she had a partial  hysterectomy transvaginal for bleeding.  She has no history of dysplasia.  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   REVIEW OF SYSTEMS:  14-point review of systems is reviewed and is negative  except for swelling, muscle aches, fever, night sweats, hot flashes, vaginal  itching, pain with intercourse, trouble with urination, also related to  menopausal symptoms which she feels like she  has been for the last five  years.  She also has some decreased smell and some fatigue and some joint  pain that she associates with lupus flare.   PHYSICAL EXAMINATION:  VITAL SIGNS:  As noted in the chart.  Blood pressure  155/84, weight 191.  GENERAL:  She is a well-developed, well-nourished black female in no acute  distress.  ABDOMEN:  Soft, nontender, nondistended.  BREASTS:  Symmetric with everted nipples.  NODES:  There is no supraclavicular or axillary adenopathy.  SKIN:  She has well healed old scars in several locations.  GENITOURINARY:  She has very atrophic external genitalia as well as the  vagina.  There is some erythema noted and paleness.  There is definitely  tenderness with the speculum examination.  There is no masses at the vaginal  cuff.  Uterus is surgically absent.  The adnexa were without mass or  tenderness.   IMPRESSION:  1.  Probable urinary tract infection.  2.  GYN examination with Pap.   PLAN:  1.  Pap smear today.  2.  UA and urine culture.  3.  Have given her a prescription for Cipro.  She can call back in one day      to find out what her UA showed and about whether to get this filled.  4.  Scheduled patient for mammography.           ______________________________  Tinnie Gens, MD     TP/MEDQ  D:  11/25/2005  T:  11/26/2005  Job:  [id number]

## 2011-02-05 NOTE — Op Note (Signed)
NAME:  [name], [name]                       ACCOUNT NO.:  [id number]   MEDICAL RECORD NO.:  [id number]                   PATIENT TYPE:  AMB   LOCATION:  DSC                                  FACILITY:  [facility]   PHYSICIAN:  Feliberto Gottron. Turner Daniels, M.D.                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  ACL cyst.   POSTOPERATIVE DIAGNOSIS:  Left knee medial meniscal tear with  chondromalacia.  ACL cyst.   PROCEDURE:  Left knee arthroscopic removal of ACL cyst, partial medial  meniscectomy, debridement of chondromalacia medial femoral condyle and  lateral femoral condyle grade III, patellofemoral joint grade III, focal  grade IV.   SURGEON:  Feliberto Gottron. Turner Daniels, M.D.   ASSISTANTLaural Benes. Jannet Mantis.   ANESTHESIA:  General LMA.   ESTIMATED BLOOD LOSS:  Minimal.   FLUIDS REPLACED:  700 mL crystalloid.   DRAINS:  None.   TOURNIQUET TIME:  None.   INDICATIONS FOR PROCEDURE:  A 60 year old woman injured at work with left  knee medial joint line pain.  MRI proven medial meniscal tear, ACL cyst, and  chondromalacia of the patellofemoral joint.  She has failed conservative  treatment with anti-inflammatory medicine, job modification, exercise, and  rest, and still had significant medial joint line pain, lateral joint line  pain, and peripatellar pain.  She was taken for arthroscopic debridement,  evaluation, and treatment.  Risks and benefits of surgery are understood by  the patient.   DESCRIPTION OF PROCEDURE:  The patient was identified by armband, taken to  the operating room at [facility].  Appropriate anesthetic  monitors were attached and general LMA anesthesia induced with the patient  in the supine position.  Lateral posts applied to the table and the left  lower extremity prepped and draped in  the usual sterile fashion from the  ankle to the mid thigh.  Using a #11 blade, standard inferior medial and  inferior lateral peripatellar portals were then made allowing introduction  of the arthroscope through the inferolateral portal and the outflow through  the inferomedial portal.  The fluid pressure was kept at 60 to 80 mm and  diagnostic arthroscopy revealed grade III chondromalacia to focal grade IV  chondromalacia of the patellofemoral joint which was debrided back to stable  margins.  Grade III of the medial femoral condyle again debrided back to  stable margins.  Complex tearing of the posterior and medial horns of the  medial meniscus which were debrided back to stable margins using a 3.5 Gator  sucker shaver, a probe, and a small biter.  We did find a cyst on the  anterior aspect of the ACL distally as it went to its insertion.  This was  stripped off  the ACL fibers without difficulty using the sucker shaver.  The  cyst was about 3 x 3 mm in size.  On the lateral side, chondromalacia of  lateral femoral condyle grade III was found focal and debrided.  Lateral  meniscus was intact.  Small incidental tearing of the posterior horn of the  lateral meniscus was also debrided.  The knee was then irrigated out with  normal saline solution. The arthroscopic instruments removed and a dressing  of Xeroform, 4x4 dressing, sponges, Webril, and an Ace wrap applied. The  patient was then awakened and taken to the recovery room without difficulty.                                               Feliberto Gottron. Turner Daniels, M.D.    [name]  D:  09/16/2003  T:  09/16/2003  Job:  [id number]

## 2011-02-05 NOTE — Op Note (Signed)
[name], [name] NO.:  [id number]   MEDICAL RECORD NO.:  [id number]          PATIENT TYPE:  INP   LOCATION:  2899                         FACILITY:  [facility]   PHYSICIAN:  Feliberto Gottron. Turner Daniels, M.D.   DATE OF BIRTH:  August 01, 1951   DATE OF PROCEDURE:  01/27/2005  DATE OF DISCHARGE:                                 OPERATIVE REPORT   PREOPERATIVE DIAGNOSIS:  End stage arthritis of the left knee with 15 degree  flexion contracture.   POSTOPERATIVE DIAGNOSIS:  End stage arthritis of the left knee with 15  degree flexion contracture.   PROCEDURE:  Left total knee arthroplasty using DePuy Sigma RP components, 3  femur, 3 tibia, 10 RP spacer, and a 38 mm patellar button.   SURGEON:  Feliberto Gottron. Turner Daniels, M.D.   FIRST ASSISTANT:  Skip Mayer, P.A.-C.   ANESTHESIA:  General endotracheal anesthesia.   ESTIMATED BLOOD LOSS:  Minimal.   FLUIDS REPLACED:  1200 mL crystalloid.   TOURNIQUET TIME:  1 hour 35 minutes.   INDICATIONS FOR PROCEDURE:  60 year old woman known by me for many years  with end stage arthritis of the left knee.  She has had arthroscopy,  physical therapy, anti-inflammatory medicines, and cortisone injections.  On  the arthroscopy, she had some bare bone arthritic changes of the  patellofemoral compartment as well as the medial compartment of her left  knee.  Because of persistent pain and increasing contracture into flexion,  she desires elective left total knee arthroplasty.  The risks and benefits  are well understood by the patient.  She does have a latex allergy and all  those precautions were taken.  The risks and benefits of surgery were  discussed with the patient at length prior to surgery.   DESCRIPTION OF PROCEDURE:  The patient was identified by arm band and taken  to the operating room at [facility].  Appropriate anesthetic  monitors were attached and general endotracheal anesthesia was induced with  the patient in the supine  position.  Lateral posts and foot positioner  applied to the left side of the table.  A tourniquet was applied high to the  left thigh and the left lower extremity prepped and draped in the usual  sterile fashion from the ankle to the tourniquet.  The limb was wrapped in  an Esmarch bandage, knee bent to 90 degrees, tourniquet inflated to 350 mmHg  and we began the procedure by making a straight anterior midline incision  through the skin and subcutaneous tissue about 25 cm in length starting a  handbreadth above the patella, going over the patella, and then going 2 cm  distal to and just medial to the tibial tubercle.  Small bleeders in the  skin and subcutaneous tissue were identified and cauterized.  Medial  parapatellar arthrotomy was accomplished retaining a small cuff of medial  tissue as the VMO inserted, allowing [name] to evert the patella and remove the  prepatellar fat pad as well as elevate the insertion of the superficial  medial collateral ligament from proximal to distal, anterior to posterior,  leaving it  intact distally as we wrapped around semimembranosus tendon.  The  knee was then flexed up allowing resection of the peripheral osteophytes  with an osteotome and rongeurs and removal of the cruciate ligaments with  the electrocautery.  A Z-retractor was placed posteromedial and McHale  through the notch and a lateral Holman retractor enhancing our exposure of  the proximal tibial as we hyperflexed the knee.  The tibial spine was  removed with a power saw and the DePuy step drill was then used to enter the  proximal tibia followed by the intramedullary rod and the 0 degree posterior  slope cutting guide allowing resection of a fairly even cut of the medial  and lateral proximal tibia about 8 mm on each side overall.  Satisfied with  the proximal tibial resection using the cutting guide, we then entered the  distal femur with the intramedullary rod and a 5 degree left distal  femoral  cutting guide.  Because of her flexion contraction, we set the cutting block  at 12 off the high side which was, oddly enough, medial, and performed the  distal femoral cut, sized for a number 3 femoral component, placed the pins  through the posterior sizing guide, and brought up a #3 cutting block which  was hammered into place allowing anterior, posterior, and chamfer cuts  without difficulty, followed by the standard DePuy box cut.  The everted  patella was then grasped with the patellar cutting guide,  measured at 22  mm, the cutting guide set at 14, we resected the posterior 8-9 mm of the  patella, sized for a 38 patellar button, and drilled the sizing guide.  At  this point, the knee was hyperflexed.  We checked with the blocks for the 10  mm spacer on full extension and flexion and with the knee hyper-flexed, went  ahead and brought up a #3 tibial baseplate which was pinned into place  followed by the smoke stack and the reversed conical reamer and then the  delta fit keel.  The pins were removed.  A trial 10 mm spacer was brought  up.  #3 left femoral component trial was then hammered into place, the 10 mm  spacer snapped into place, the knee came to full extension and flexed to  about 110 to 115 degrees limited by some tightness of her quad which is a  pre-existing condition.  The patellar button trial tracked normally.  At  this point, the trial components were removed.  All bony surfaces were water  picked clean and dried with suction and sponges, and a double batch of DePuy  polymethyl methacrylate cement with 1500 mg Zinacef was then mixed and  applied to all bony and metallic mating surfaces except for the posterior  condyles of the femur.  In order, we then hammered into place, a #3 tibial  component and removed excess cement, a #3 left femoral component and removed excess cement.  The 10RP spacer was then snapped into place and the knee  brought to full extension,  given one good squeeze, and again excess cement  removed from around the periphery of the components.  The 38 mm patellar  button with cement was then clamped into place and excess cement removed and  the cement allowed to harden.  We water picked the wound clean one more  time.  Medium Hemovac drains were placed laterally and after unclamping the  patella, we checked our tracking one more time and found it to be  excellent.  The parapatellar arthrotomy was then closed with running #1 Vicryl suture,  the subcutaneous tissue with 0 and 2-0 undyed Vicryl suture, and the skin  with skin staples.  A dressing of Xeroform, 4 by 4 dressing sponges, Webril,  and an Ace wrap was applied.  The tourniquet was let down.  The patient was  then awakened and taken to the recovery room without difficulty.      FJR/MEDQ  D:  01/27/2005  T:  01/27/2005  Job:  [id number]

## 2011-03-15 ENCOUNTER — Inpatient Hospital Stay (HOSPITAL_COMMUNITY): Admission: RE | Admit: 2011-03-15 | Payer: Medicare Other | Source: Ambulatory Visit | Admitting: Orthopedic Surgery

## 2011-05-06 ENCOUNTER — Other Ambulatory Visit: Payer: Self-pay | Admitting: Oncology

## 2011-05-06 ENCOUNTER — Encounter (HOSPITAL_BASED_OUTPATIENT_CLINIC_OR_DEPARTMENT_OTHER): Payer: Medicare Other | Admitting: Oncology

## 2011-05-06 DIAGNOSIS — C50419 Malignant neoplasm of upper-outer quadrant of unspecified female breast: Secondary | ICD-10-CM

## 2011-05-06 DIAGNOSIS — Z170 Estrogen receptor positive status [ER+]: Secondary | ICD-10-CM

## 2011-05-06 DIAGNOSIS — R0789 Other chest pain: Secondary | ICD-10-CM

## 2011-05-06 DIAGNOSIS — C50919 Malignant neoplasm of unspecified site of unspecified female breast: Secondary | ICD-10-CM

## 2011-05-06 LAB — CBC WITH DIFFERENTIAL/PLATELET
BASO%: 1.4 % (ref 0.0–2.0)
Basophils Absolute: 0.1 10*3/uL (ref 0.0–0.1)
EOS%: 3.1 % (ref 0.0–7.0)
Eosinophils Absolute: 0.1 10*3/uL (ref 0.0–0.5)
HCT: 38.8 % (ref 34.8–46.6)
HGB: 13.1 g/dL (ref 11.6–15.9)
LYMPH%: 54.8 % — ABNORMAL HIGH (ref 14.0–49.7)
MCH: 30.7 pg (ref 25.1–34.0)
MCHC: 33.9 g/dL (ref 31.5–36.0)
MCV: 90.8 fL (ref 79.5–101.0)
MONO#: 0.7 10*3/uL (ref 0.1–0.9)
MONO%: 17.3 % — ABNORMAL HIGH (ref 0.0–14.0)
NEUT#: 1 10*3/uL — ABNORMAL LOW (ref 1.5–6.5)
NEUT%: 23.4 % — ABNORMAL LOW (ref 38.4–76.8)
Platelets: 158 10*3/uL (ref 145–400)
RBC: 4.27 10*6/uL (ref 3.70–5.45)
RDW: 13.7 % (ref 11.2–14.5)
WBC: 4.2 10*3/uL (ref 3.9–10.3)
lymph#: 2.3 10*3/uL (ref 0.9–3.3)

## 2011-05-06 LAB — COMPREHENSIVE METABOLIC PANEL
ALT: 27 U/L (ref 0–35)
AST: 29 U/L (ref 0–37)
Albumin: 3.8 g/dL (ref 3.5–5.2)
Alkaline Phosphatase: 63 U/L (ref 39–117)
BUN: 16 mg/dL (ref 6–23)
CO2: 27 mEq/L (ref 19–32)
Calcium: 9.8 mg/dL (ref 8.4–10.5)
Chloride: 101 mEq/L (ref 96–112)
Creatinine, Ser: 0.72 mg/dL (ref 0.50–1.10)
Glucose, Bld: 99 mg/dL (ref 70–99)
Potassium: 3.7 mEq/L (ref 3.5–5.3)
Sodium: 139 mEq/L (ref 135–145)
Total Bilirubin: 0.4 mg/dL (ref 0.3–1.2)
Total Protein: 7.3 g/dL (ref 6.0–8.3)

## 2011-05-07 ENCOUNTER — Encounter (HOSPITAL_COMMUNITY): Payer: Self-pay

## 2011-05-07 ENCOUNTER — Other Ambulatory Visit: Payer: Self-pay | Admitting: Oncology

## 2011-05-07 ENCOUNTER — Ambulatory Visit (HOSPITAL_COMMUNITY)
Admission: RE | Admit: 2011-05-07 | Discharge: 2011-05-07 | Disposition: A | Discharge status: Home / Self Care | Payer: Medicare Other | Source: Ambulatory Visit | Attending: Oncology | Admitting: Oncology

## 2011-05-07 DIAGNOSIS — Z79899 Other long term (current) drug therapy: Secondary | ICD-10-CM | POA: Insufficient documentation

## 2011-05-07 DIAGNOSIS — R0602 Shortness of breath: Secondary | ICD-10-CM | POA: Insufficient documentation

## 2011-05-07 DIAGNOSIS — R079 Chest pain, unspecified: Secondary | ICD-10-CM | POA: Insufficient documentation

## 2011-05-07 DIAGNOSIS — C50919 Malignant neoplasm of unspecified site of unspecified female breast: Secondary | ICD-10-CM | POA: Insufficient documentation

## 2011-05-07 DIAGNOSIS — R0789 Other chest pain: Secondary | ICD-10-CM

## 2011-05-07 HISTORY — DX: Essential (primary) hypertension: I10

## 2011-05-07 MED ORDER — IOHEXOL 300 MG/ML  SOLN
80.0000 mL | Freq: Once | INTRAMUSCULAR | Status: AC | PRN
  Administered 2011-05-07: 80 mL via INTRAVENOUS

## 2011-06-07 ENCOUNTER — Encounter (HOSPITAL_BASED_OUTPATIENT_CLINIC_OR_DEPARTMENT_OTHER): Payer: Medicare Other | Admitting: Oncology

## 2011-06-07 ENCOUNTER — Other Ambulatory Visit: Payer: Self-pay | Admitting: Oncology

## 2011-06-07 DIAGNOSIS — Z170 Estrogen receptor positive status [ER+]: Secondary | ICD-10-CM

## 2011-06-07 DIAGNOSIS — C50419 Malignant neoplasm of upper-outer quadrant of unspecified female breast: Secondary | ICD-10-CM

## 2011-06-07 LAB — CBC WITH DIFFERENTIAL/PLATELET
BASO%: 0.6 % (ref 0.0–2.0)
Basophils Absolute: 0 10*3/uL (ref 0.0–0.1)
EOS%: 2.1 % (ref 0.0–7.0)
Eosinophils Absolute: 0.1 10*3/uL (ref 0.0–0.5)
HCT: 38.1 % (ref 34.8–46.6)
HGB: 12.6 g/dL (ref 11.6–15.9)
LYMPH%: 60.4 % — ABNORMAL HIGH (ref 14.0–49.7)
MCH: 29.5 pg (ref 25.1–34.0)
MCHC: 33.1 g/dL (ref 31.5–36.0)
MCV: 89.2 fL (ref 79.5–101.0)
MONO#: 0.8 10*3/uL (ref 0.1–0.9)
MONO%: 13.6 % (ref 0.0–14.0)
NEUT#: 1.4 10*3/uL — ABNORMAL LOW (ref 1.5–6.5)
NEUT%: 23.3 % — ABNORMAL LOW (ref 38.4–76.8)
Platelets: 175 10*3/uL (ref 145–400)
RBC: 4.27 10*6/uL (ref 3.70–5.45)
RDW: 13.2 % (ref 11.2–14.5)
WBC: 6.2 10*3/uL (ref 3.9–10.3)
lymph#: 3.7 10*3/uL — ABNORMAL HIGH (ref 0.9–3.3)
nRBC: 2 % — ABNORMAL HIGH (ref 0–0)

## 2011-06-14 LAB — CBC
HCT: 40.7
Hemoglobin: 13.7
MCHC: 33.6
MCV: 91
Platelets: 201
RBC: 4.48
RDW: 13.7
WBC: 4.8

## 2011-06-14 LAB — URINALYSIS, ROUTINE W REFLEX MICROSCOPIC
Bilirubin Urine: NEGATIVE
Glucose, UA: NEGATIVE
Hgb urine dipstick: NEGATIVE
Ketones, ur: NEGATIVE
Nitrite: NEGATIVE
Protein, ur: NEGATIVE
Specific Gravity, Urine: 1.006
Urobilinogen, UA: 0.2
pH: 7

## 2011-06-14 LAB — COMPREHENSIVE METABOLIC PANEL
ALT: 26
AST: 27
Albumin: 3.7
Alkaline Phosphatase: 94
BUN: 8
CO2: 25
Calcium: 9.4
Chloride: 105
Creatinine, Ser: 0.68
GFR calc Af Amer: >60
GFR calc non Af Amer: >60
Glucose, Bld: 93
Potassium: 3.9
Sodium: 140
Total Bilirubin: 0.3
Total Protein: 7.2

## 2011-06-14 LAB — PROTIME-INR
INR: 1
Prothrombin Time: 12.9

## 2011-06-14 LAB — DIFFERENTIAL
Basophils Absolute: 0.1
Basophils Relative: 1
Eosinophils Absolute: 0.1
Eosinophils Relative: 2
Lymphocytes Relative: 48 — ABNORMAL HIGH
Lymphs Abs: 2.3
Monocytes Absolute: 0.4
Monocytes Relative: 9
Neutro Abs: 1.9
Neutrophils Relative %: 39 — ABNORMAL LOW

## 2011-06-14 LAB — RAPID URINE DRUG SCREEN, HOSP PERFORMED
Amphetamines: NOT DETECTED
Barbiturates: NOT DETECTED
Benzodiazepines: NOT DETECTED
Cocaine: NOT DETECTED
Opiates: POSITIVE — AB
Tetrahydrocannabinol: NOT DETECTED

## 2011-06-14 LAB — TYPE AND SCREEN
ABO/RH(D): O POS
Antibody Screen: POSITIVE
DAT, IgG: NEGATIVE

## 2011-06-14 LAB — POCT CARDIAC MARKERS
CKMB, poc: 1.8
Myoglobin, poc: 100
Operator id: 285841
Troponin i, poc: <0.05

## 2011-06-14 LAB — APTT: aPTT: 31

## 2011-06-14 LAB — ETHANOL: Alcohol, Ethyl (B): <5

## 2011-06-17 LAB — DIFFERENTIAL
Basophils Absolute: 0
Basophils Relative: 1
Eosinophils Absolute: 0.1
Eosinophils Relative: 3
Lymphocytes Relative: 53 — ABNORMAL HIGH
Lymphs Abs: 2.4
Monocytes Absolute: 0.5
Monocytes Relative: 10
Neutro Abs: 1.5 — ABNORMAL LOW
Neutrophils Relative %: 33 — ABNORMAL LOW

## 2011-06-17 LAB — CBC
HCT: 42.2
Hemoglobin: 14.3
MCHC: 34
MCV: 91.2
Platelets: 224
RBC: 4.62
RDW: 13.3
WBC: 4.5

## 2011-06-17 LAB — COMPREHENSIVE METABOLIC PANEL
ALT: 27
AST: 29
Albumin: 4.1
Alkaline Phosphatase: 102
BUN: 9
CO2: 28
Calcium: 10
Chloride: 102
Creatinine, Ser: 0.76
GFR calc Af Amer: >60
GFR calc non Af Amer: >60
Glucose, Bld: 87
Potassium: 4.2
Sodium: 136
Total Bilirubin: 0.6
Total Protein: 8.1

## 2011-06-17 LAB — PROTIME-INR
INR: 1
Prothrombin Time: 13.8

## 2011-06-18 LAB — POCT HEMOGLOBIN-HEMACUE: Hemoglobin: 10.7 — ABNORMAL LOW

## 2011-06-18 LAB — BASIC METABOLIC PANEL
BUN: 10
CO2: 28
Calcium: 9.3
Chloride: 106
Creatinine, Ser: 0.69
GFR calc Af Amer: >60
GFR calc non Af Amer: >60
Glucose, Bld: 106 — ABNORMAL HIGH
Potassium: 4.4
Sodium: 138

## 2011-07-29 ENCOUNTER — Other Ambulatory Visit: Payer: Self-pay | Admitting: Orthopedic Surgery

## 2011-08-17 ENCOUNTER — Encounter (HOSPITAL_COMMUNITY): Payer: Self-pay | Admitting: Pharmacy Technician

## 2011-08-23 ENCOUNTER — Encounter (HOSPITAL_COMMUNITY): Payer: Self-pay

## 2011-08-23 ENCOUNTER — Encounter (HOSPITAL_COMMUNITY)
Admission: RE | Admit: 2011-08-23 | Discharge: 2011-08-23 | Disposition: A | Discharge status: Home / Self Care | Payer: Medicare Other | Source: Ambulatory Visit | Attending: Orthopedic Surgery | Admitting: Orthopedic Surgery

## 2011-08-23 ENCOUNTER — Ambulatory Visit (HOSPITAL_COMMUNITY)
Admission: RE | Admit: 2011-08-23 | Discharge: 2011-08-23 | Disposition: A | Discharge status: Home / Self Care | Payer: Medicare Other | Source: Ambulatory Visit | Attending: Anesthesiology | Admitting: Anesthesiology

## 2011-08-23 DIAGNOSIS — Z01818 Encounter for other preprocedural examination: Secondary | ICD-10-CM | POA: Insufficient documentation

## 2011-08-23 DIAGNOSIS — Z01812 Encounter for preprocedural laboratory examination: Secondary | ICD-10-CM | POA: Insufficient documentation

## 2011-08-23 HISTORY — DX: Gastro-esophageal reflux disease without esophagitis: K21.9

## 2011-08-23 HISTORY — DX: Systemic lupus erythematosus, unspecified: M32.9

## 2011-08-23 HISTORY — DX: Reserved for concepts with insufficient information to code with codable children: IMO0002

## 2011-08-23 HISTORY — DX: Inflammatory liver disease, unspecified: K75.9

## 2011-08-23 HISTORY — DX: Cardiac murmur, unspecified: R01.1

## 2011-08-23 HISTORY — DX: Encounter for other specified aftercare: Z51.89

## 2011-08-23 HISTORY — DX: Chronic kidney disease, unspecified: N18.9

## 2011-08-23 HISTORY — DX: Unspecified osteoarthritis, unspecified site: M19.90

## 2011-08-23 HISTORY — DX: Reserved for inherently not codable concepts without codable children: IMO0001

## 2011-08-23 LAB — PROTIME-INR
INR: 1.09 (ref 0.00–1.49)
Prothrombin Time: 14.3 seconds (ref 11.6–15.2)

## 2011-08-23 LAB — SURGICAL PCR SCREEN
MRSA, PCR: NEGATIVE
Staphylococcus aureus: POSITIVE — AB

## 2011-08-23 LAB — APTT: aPTT: 27 seconds (ref 24–37)

## 2011-08-23 NOTE — Progress Notes (Signed)
Bld. Bank requesting extra sample DOS.

## 2011-08-23 NOTE — Progress Notes (Signed)
Call to [facility] in [place].- reported LATEX allergy

## 2011-08-23 NOTE — Progress Notes (Signed)
Call to Dr. Renae Gloss, requesting ekg that was performed at last appt. Pt. Has appt. /w cardiology for review in the near future (prior to this sch. Surgery)

## 2011-08-23 NOTE — Pre-Procedure Instructions (Signed)
20 [name]  08/23/2011   Your procedure is scheduled on:  08/30/2011  Report to [facility] at 5:30 AM.  Call this number if you have problems the morning of surgery: [phone]   Remember:   Do not eat food:After Midnight.  May have clear liquids: up to 4 Hours before arrival.  Clear liquids include soda, tea, black coffee, apple or grape juice, broth.  Take these medicines the morning of surgery with A SIP OF WATER:metoprolol   Do not wear jewelry, make-up or nail polish.  Do not wear lotions, powders, or perfumes. You may wear deodorant.  Do not shave 48 hours prior to surgery.  Do not bring valuables to the hospital.  Contacts, dentures or bridgework may not be worn into surgery.  Leave suitcase in the car. After surgery it may be brought to your room.  For patients admitted to the hospital, checkout time is 11:00 AM the day of discharge.   Patients discharged the day of surgery will not be allowed to drive home.  Name and phone number of your driver:   Special Instructions: CHG Shower Use Special Wash: 1/2 bottle night before surgery and 1/2 bottle morning of surgery.   Please read over the following fact sheets that you were given: Pain Booklet, Coughing and Deep Breathing, Blood Transfusion Information, Total Joint Packet, MRSA Information and Surgical Site Infection Prevention

## 2011-08-23 NOTE — Progress Notes (Signed)
2 calls to Dr. Mathews Robinsons office for records on this pt. , ekg & labs

## 2011-08-26 ENCOUNTER — Ambulatory Visit: Payer: Medicare Other | Admitting: Oncology

## 2011-08-26 ENCOUNTER — Other Ambulatory Visit: Payer: Medicare Other | Admitting: Lab

## 2011-08-26 NOTE — Progress Notes (Signed)
Called [facility] to request ov from 08/24/11  [phone]

## 2011-08-26 NOTE — H&P (Signed)
HISTORY OF PRESENT ILLNESS:  [name] continues to report severe, unremitting pain in her right knee.  She is ambulating with a cane, and reports that her pain wakes her from sleep at night.  She finished up her Supartz injections at the end of February and reports the pain has returned with a vengeance in her right knee.  She also already had a left total knee done about 6 years ago and is now thinking about getting the right 1 replaced.  PAST MEDICAL HISTORY:  MEDICAL PROBLEMS:  Past medical history is significant for breast cancer in 2009.  She has hypertension and gastroesophageal reflux disease.  PAST SURGICAL HISTORY:  Left total knee replacement in 2006, hysterectomy in 1986, tubal ligation in 1979 and breast lumpectomy in 2009.  ALLERGIES:  She is allergic to penicillin, tetracycline, iodine and latex.  MEDICATIONS:  Current medications include Nexium and Motrin 800 mg.    ROS: Patient denies dizziness, nausea, fever, chills, vomiting, shortness of breath, chest pain, loss of appetite, or rash.    PHYSICAL EXAM: Well-developed, well-nourished.  Awake, alert, and oriented x3.  Extraocular motion is intact.  No use of accessory respiratory muscles for breathing.   Cardiovascular exam reveals a regular rhythm.  Skin is intact without cuts, scrapes, or abrasions.  She has 1+ swelling to the right knee.  Trace effusion.  Tender along the medial and lateral joint line.  Repeat x-rays shot today do show some chondrocalcinosis and when we did her knee scope back in December 2011, she did have some bare-bone arthritic changes to the femoral condyles consistent with end-stage arthritis.  Photographs from her arthroscopy are much more impressive than her plain radiographs.  IMPRESSION:  End-stage arthritis of the right knee having failed all conservative measures up until now.  PLAN:  We will get her set up for right total knee arthroscopy.  She did well with her left total knee which is a Photographer 6 years ago on the left side.  I will reassess her at the time of surgical intervention.  Also I have refilled her Vicodin 5 mg 1 p.o. q. 4-6 hours p.r.n. dispense 60, no refills.

## 2011-08-26 NOTE — Progress Notes (Signed)
Received ov note from 08/24/11

## 2011-08-29 ENCOUNTER — Encounter (HOSPITAL_COMMUNITY): Payer: Self-pay | Admitting: Orthopedic Surgery

## 2011-08-29 DIAGNOSIS — M1711 Unilateral primary osteoarthritis, right knee: Secondary | ICD-10-CM | POA: Diagnosis present

## 2011-08-30 ENCOUNTER — Inpatient Hospital Stay (HOSPITAL_COMMUNITY)
Admission: RE | Admit: 2011-08-30 | Discharge: 2011-09-02 | DRG: 470 | Disposition: A | Discharge status: Skilled Nursing Facility | Payer: Medicare Other | Source: Ambulatory Visit | Attending: Orthopedic Surgery | Admitting: Orthopedic Surgery

## 2011-08-30 ENCOUNTER — Encounter (HOSPITAL_COMMUNITY): Payer: Self-pay | Admitting: *Deleted

## 2011-08-30 ENCOUNTER — Encounter (HOSPITAL_COMMUNITY): Payer: Self-pay | Admitting: Anesthesiology

## 2011-08-30 ENCOUNTER — Encounter (HOSPITAL_COMMUNITY)
Admission: RE | Disposition: A | Discharge status: Skilled Nursing Facility | Payer: Self-pay | Source: Ambulatory Visit | Attending: Orthopedic Surgery

## 2011-08-30 ENCOUNTER — Inpatient Hospital Stay (HOSPITAL_COMMUNITY): Payer: Medicare Other | Admitting: Anesthesiology

## 2011-08-30 DIAGNOSIS — K219 Gastro-esophageal reflux disease without esophagitis: Secondary | ICD-10-CM | POA: Diagnosis present

## 2011-08-30 DIAGNOSIS — Z853 Personal history of malignant neoplasm of breast: Secondary | ICD-10-CM

## 2011-08-30 DIAGNOSIS — M1711 Unilateral primary osteoarthritis, right knee: Secondary | ICD-10-CM | POA: Diagnosis present

## 2011-08-30 DIAGNOSIS — M1710 Unilateral primary osteoarthritis, unspecified knee: Principal | ICD-10-CM | POA: Diagnosis present

## 2011-08-30 DIAGNOSIS — I10 Essential (primary) hypertension: Secondary | ICD-10-CM | POA: Diagnosis present

## 2011-08-30 DIAGNOSIS — Z23 Encounter for immunization: Secondary | ICD-10-CM

## 2011-08-30 HISTORY — PX: TOTAL KNEE ARTHROPLASTY: SHX125

## 2011-08-30 LAB — TYPE AND SCREEN
ABO/RH(D): O POS
Antibody Screen: POSITIVE

## 2011-08-30 SURGERY — ARTHROPLASTY, KNEE, TOTAL
Anesthesia: Regional | Site: Knee | Laterality: Right | Wound class: Clean

## 2011-08-30 MED ORDER — MENTHOL 3 MG MT LOZG
1.0000 | LOZENGE | OROMUCOSAL | Status: DC | PRN
Start: 2011-08-30 — End: 2011-09-02

## 2011-08-30 MED ORDER — SODIUM CHLORIDE 0.9 % IR SOLN
Status: DC | PRN
  Administered 2011-08-30: 1000 mL

## 2011-08-30 MED ORDER — PROPOFOL 10 MG/ML IV EMUL
INTRAVENOUS | Status: DC | PRN
  Administered 2011-08-30: 200 mg via INTRAVENOUS

## 2011-08-30 MED ORDER — HYDROMORPHONE HCL PF 1 MG/ML IJ SOLN
INTRAMUSCULAR | Status: AC
  Administered 2011-08-30: 1 mg via INTRAVENOUS
  Filled 2011-08-30: qty 1

## 2011-08-30 MED ORDER — PANTOPRAZOLE SODIUM 20 MG PO TBEC: 20.0000 mg | DELAYED_RELEASE_TABLET | Freq: Every day | ORAL | Status: DC

## 2011-08-30 MED ORDER — NEOSTIGMINE METHYLSULFATE 1 MG/ML IJ SOLN
INTRAMUSCULAR | Status: DC | PRN
  Administered 2011-08-30: 3 mg via INTRAVENOUS

## 2011-08-30 MED ORDER — LACTATED RINGERS IV SOLN
INTRAVENOUS | Status: DC | PRN
  Administered 2011-08-30 (×2): via INTRAVENOUS

## 2011-08-30 MED ORDER — DIPHENHYDRAMINE HCL 12.5 MG/5ML PO ELIX
12.5000 mg | ORAL_SOLUTION | ORAL | Status: DC | PRN
  Filled 2011-08-30: qty 10

## 2011-08-30 MED ORDER — PANTOPRAZOLE SODIUM 40 MG PO TBEC
40.0000 mg | DELAYED_RELEASE_TABLET | Freq: Every day | ORAL | Status: DC
  Administered 2011-08-30 – 2011-09-02 (×4): 40 mg via ORAL
  Filled 2011-08-30 (×4): qty 1

## 2011-08-30 MED ORDER — HYDROMORPHONE HCL PF 1 MG/ML IJ SOLN
0.5000 mg | INTRAMUSCULAR | Status: DC | PRN
  Administered 2011-08-30 – 2011-09-02 (×7): 1 mg via INTRAVENOUS
  Filled 2011-08-30 (×6): qty 1

## 2011-08-30 MED ORDER — COUMADIN BOOK
Freq: Once | Status: DC
  Filled 2011-08-30: qty 1

## 2011-08-30 MED ORDER — BUPIVACAINE-EPINEPHRINE PF 0.5-1:200000 % IJ SOLN
INTRAMUSCULAR | Status: DC | PRN
  Administered 2011-08-30: 30 mL

## 2011-08-30 MED ORDER — ONDANSETRON HCL 4 MG/2ML IJ SOLN
INTRAMUSCULAR | Status: DC | PRN
  Administered 2011-08-30: 4 mg via INTRAVENOUS

## 2011-08-30 MED ORDER — HYDROMORPHONE HCL PF 1 MG/ML IJ SOLN
0.2500 mg | INTRAMUSCULAR | Status: DC | PRN
  Administered 2011-08-30 (×4): 0.5 mg via INTRAVENOUS

## 2011-08-30 MED ORDER — PHENOL 1.4 % MT LIQD
1.0000 | OROMUCOSAL | Status: DC | PRN
  Filled 2011-08-30: qty 177

## 2011-08-30 MED ORDER — VANCOMYCIN HCL IN DEXTROSE 1-5 GM/200ML-% IV SOLN
INTRAVENOUS | Status: AC
  Filled 2011-08-30: qty 200

## 2011-08-30 MED ORDER — WARFARIN SODIUM 7.5 MG PO TABS
7.5000 mg | ORAL_TABLET | Freq: Once | ORAL | Status: AC
  Administered 2011-08-30: 7.5 mg via ORAL
  Filled 2011-08-30: qty 1

## 2011-08-30 MED ORDER — HYDROCHLOROTHIAZIDE 25 MG PO TABS
25.0000 mg | ORAL_TABLET | Freq: Every day | ORAL | Status: DC
  Administered 2011-08-30 – 2011-09-01 (×2): 25 mg via ORAL
  Filled 2011-08-30 (×4): qty 1

## 2011-08-30 MED ORDER — WARFARIN VIDEO: Freq: Once | Status: DC

## 2011-08-30 MED ORDER — MIDAZOLAM HCL 5 MG/5ML IJ SOLN
INTRAMUSCULAR | Status: DC | PRN
  Administered 2011-08-30: 2 mg via INTRAVENOUS

## 2011-08-30 MED ORDER — METOCLOPRAMIDE HCL 5 MG/ML IJ SOLN
5.0000 mg | Freq: Three times a day (TID) | INTRAMUSCULAR | Status: DC | PRN
  Filled 2011-08-30: qty 2

## 2011-08-30 MED ORDER — MAGNESIUM HYDROXIDE 400 MG/5ML PO SUSP: 30.0000 mL | Freq: Every day | ORAL | Status: DC | PRN

## 2011-08-30 MED ORDER — ENOXAPARIN SODIUM 30 MG/0.3ML ~~LOC~~ SOLN
30.0000 mg | Freq: Two times a day (BID) | SUBCUTANEOUS | Status: DC
  Administered 2011-08-30 – 2011-09-01 (×4): 30 mg via SUBCUTANEOUS
  Filled 2011-08-30 (×6): qty 0.3

## 2011-08-30 MED ORDER — ROCURONIUM BROMIDE 100 MG/10ML IV SOLN
INTRAVENOUS | Status: DC | PRN
  Administered 2011-08-30: 50 mg via INTRAVENOUS

## 2011-08-30 MED ORDER — GLYCOPYRROLATE 0.2 MG/ML IJ SOLN
INTRAMUSCULAR | Status: DC | PRN
  Administered 2011-08-30: .4 mg via INTRAVENOUS

## 2011-08-30 MED ORDER — PHENYLEPHRINE HCL 10 MG/ML IJ SOLN
INTRAMUSCULAR | Status: DC | PRN
  Administered 2011-08-30: 40 ug via INTRAVENOUS
  Administered 2011-08-30 (×2): 80 ug via INTRAVENOUS

## 2011-08-30 MED ORDER — ACETAMINOPHEN 325 MG PO TABS
650.0000 mg | ORAL_TABLET | Freq: Four times a day (QID) | ORAL | Status: DC | PRN
  Administered 2011-09-01: 650 mg via ORAL
  Filled 2011-08-30: qty 2

## 2011-08-30 MED ORDER — HYDROCODONE-ACETAMINOPHEN 5-325 MG PO TABS
1.0000 | ORAL_TABLET | ORAL | Status: DC | PRN
  Administered 2011-08-30 – 2011-09-02 (×6): 2 via ORAL
  Filled 2011-08-30 (×6): qty 2

## 2011-08-30 MED ORDER — VANCOMYCIN HCL 1000 MG IV SOLR
1000.0000 mg | INTRAVENOUS | Status: DC | PRN
  Administered 2011-08-30: 1000 mg via INTRAVENOUS

## 2011-08-30 MED ORDER — KCL IN DEXTROSE-NACL 20-5-0.45 MEQ/L-%-% IV SOLN
INTRAVENOUS | Status: DC
  Administered 2011-08-30: 1000 mL via INTRAVENOUS
  Administered 2011-08-31 (×3): via INTRAVENOUS
  Filled 2011-08-30 (×11): qty 1000

## 2011-08-30 MED ORDER — CEFUROXIME SODIUM 1.5 G IJ SOLR
INTRAMUSCULAR | Status: DC | PRN
  Administered 2011-08-30: 1.5 g

## 2011-08-30 MED ORDER — ACETAMINOPHEN 650 MG RE SUPP: 650.0000 mg | Freq: Four times a day (QID) | RECTAL | Status: DC | PRN

## 2011-08-30 MED ORDER — ZOLPIDEM TARTRATE 5 MG PO TABS
5.0000 mg | ORAL_TABLET | Freq: Every evening | ORAL | Status: DC | PRN
  Administered 2011-08-30 – 2011-09-02 (×2): 5 mg via ORAL
  Filled 2011-08-30 (×2): qty 1

## 2011-08-30 MED ORDER — TAMOXIFEN CITRATE 10 MG PO TABS
10.0000 mg | ORAL_TABLET | Freq: Two times a day (BID) | ORAL | Status: DC
  Administered 2011-08-30 (×2): 10 mg via ORAL
  Filled 2011-08-30 (×4): qty 1

## 2011-08-30 MED ORDER — MEPERIDINE HCL 25 MG/ML IJ SOLN: 6.2500 mg | INTRAMUSCULAR | Status: DC | PRN

## 2011-08-30 MED ORDER — ONDANSETRON HCL 4 MG PO TABS: 4.0000 mg | ORAL_TABLET | Freq: Four times a day (QID) | ORAL | Status: DC | PRN

## 2011-08-30 MED ORDER — METHOCARBAMOL 500 MG PO TABS
500.0000 mg | ORAL_TABLET | Freq: Four times a day (QID) | ORAL | Status: DC | PRN
  Administered 2011-08-30 – 2011-09-01 (×6): 500 mg via ORAL
  Filled 2011-08-30 (×7): qty 1

## 2011-08-30 MED ORDER — FLEET ENEMA 7-19 GM/118ML RE ENEM: 1.0000 | ENEMA | Freq: Once | RECTAL | Status: AC | PRN

## 2011-08-30 MED ORDER — BISACODYL 5 MG PO TBEC
5.0000 mg | DELAYED_RELEASE_TABLET | Freq: Every day | ORAL | Status: DC | PRN
  Administered 2011-09-01: 5 mg via ORAL
  Filled 2011-08-30: qty 1

## 2011-08-30 MED ORDER — ONDANSETRON HCL 4 MG/2ML IJ SOLN: 4.0000 mg | Freq: Four times a day (QID) | INTRAMUSCULAR | Status: DC | PRN

## 2011-08-30 MED ORDER — METHOCARBAMOL 100 MG/ML IJ SOLN
500.0000 mg | Freq: Four times a day (QID) | INTRAVENOUS | Status: DC | PRN
  Administered 2011-08-30: 500 mg via INTRAVENOUS
  Filled 2011-08-30: qty 5

## 2011-08-30 MED ORDER — TAMOXIFEN CITRATE 20 MG PO TABS: 20.0000 mg | ORAL_TABLET | Freq: Every day | ORAL | Status: DC

## 2011-08-30 MED ORDER — ALUM & MAG HYDROXIDE-SIMETH 200-200-20 MG/5ML PO SUSP: 30.0000 mL | ORAL | Status: DC | PRN

## 2011-08-30 MED ORDER — METOPROLOL SUCCINATE ER 25 MG PO TB24
25.0000 mg | ORAL_TABLET | Freq: Two times a day (BID) | ORAL | Status: DC
  Administered 2011-08-30 – 2011-09-01 (×5): 25 mg via ORAL
  Filled 2011-08-30 (×8): qty 1

## 2011-08-30 MED ORDER — METOCLOPRAMIDE HCL 10 MG PO TABS: 5.0000 mg | ORAL_TABLET | Freq: Three times a day (TID) | ORAL | Status: DC | PRN

## 2011-08-30 MED ORDER — OXYCODONE-ACETAMINOPHEN 5-325 MG PO TABS
1.0000 | ORAL_TABLET | ORAL | Status: DC | PRN
  Administered 2011-08-30 – 2011-09-01 (×7): 2 via ORAL
  Filled 2011-08-30 (×7): qty 2

## 2011-08-30 MED ORDER — PROMETHAZINE HCL 25 MG/ML IJ SOLN: 6.2500 mg | INTRAMUSCULAR | Status: DC | PRN

## 2011-08-30 MED ORDER — FENTANYL CITRATE 0.05 MG/ML IJ SOLN
INTRAMUSCULAR | Status: DC | PRN
  Administered 2011-08-30: 50 ug via INTRAVENOUS
  Administered 2011-08-30: 100 ug via INTRAVENOUS
  Administered 2011-08-30: 50 ug via INTRAVENOUS
  Administered 2011-08-30: 150 ug via INTRAVENOUS

## 2011-08-30 SURGICAL SUPPLY — 59 items
BANDAGE ELASTIC 6 VELCRO ST LF (GAUZE/BANDAGES/DRESSINGS) ×1 IMPLANT
BANDAGE ESMARK 6X9 LF (GAUZE/BANDAGES/DRESSINGS) ×1 IMPLANT
BLADE SAG 18X100X1.27 (BLADE) ×2 IMPLANT
BLADE SAW SGTL 13X75X1.27 (BLADE) ×2 IMPLANT
BLADE SURG ROTATE 9660 (MISCELLANEOUS) IMPLANT
BNDG CMPR 9X6 STRL LF SNTH (GAUZE/BANDAGES/DRESSINGS) ×1
BNDG CMPR MED 10X6 ELC LF (GAUZE/BANDAGES/DRESSINGS)
BNDG ELASTIC 6X10 VLCR STRL LF (GAUZE/BANDAGES/DRESSINGS) ×1 IMPLANT
BNDG ESMARK 6X9 LF (GAUZE/BANDAGES/DRESSINGS) ×2
BOWL SMART MIX CTS (DISPOSABLE) ×2 IMPLANT
CEMENT HV SMART SET (Cement) ×4 IMPLANT
CLOTH BEACON ORANGE TIMEOUT ST (SAFETY) ×2 IMPLANT
COVER BACK TABLE 24X17X13 BIG (DRAPES) IMPLANT
COVER SURGICAL LIGHT HANDLE (MISCELLANEOUS) ×2 IMPLANT
CUFF TOURNIQUET SINGLE 34IN LL (TOURNIQUET CUFF) ×1 IMPLANT
CUFF TOURNIQUET SINGLE 44IN (TOURNIQUET CUFF) IMPLANT
DRAPE EXTREMITY T 121X128X90 (DRAPE) ×2 IMPLANT
DRAPE U-SHAPE 47X51 STRL (DRAPES) ×2 IMPLANT
DURAPREP 26ML APPLICATOR (WOUND CARE) ×1 IMPLANT
ELECT REM PT RETURN 9FT ADLT (ELECTROSURGICAL) ×2
ELECTRODE REM PT RTRN 9FT ADLT (ELECTROSURGICAL) ×1 IMPLANT
EVACUATOR 1/8 PVC DRAIN (DRAIN) ×2 IMPLANT
GAUZE XEROFORM 1X8 LF (GAUZE/BANDAGES/DRESSINGS) ×1 IMPLANT
GLOVE BIO SURGEON STRL SZ7 (GLOVE) ×1 IMPLANT
GLOVE BIO SURGEON STRL SZ7.5 (GLOVE) ×1 IMPLANT
GLOVE BIOGEL PI IND STRL 7.0 (GLOVE) ×1 IMPLANT
GLOVE BIOGEL PI IND STRL 8 (GLOVE) ×1 IMPLANT
GLOVE BIOGEL PI INDICATOR 7.0 (GLOVE) ×1
GLOVE BIOGEL PI INDICATOR 8 (GLOVE) ×1
GLOVE SURG SS PI 6.5 STRL IVOR (GLOVE) ×3 IMPLANT
GLOVE SURG SS PI 7.5 STRL IVOR (GLOVE) ×1 IMPLANT
GLOVE SURG SS PI 8.0 STRL IVOR (GLOVE) ×1 IMPLANT
GOWN PREVENTION PLUS XLARGE (GOWN DISPOSABLE) ×2 IMPLANT
GOWN STRL NON-REIN LRG LVL3 (GOWN DISPOSABLE) ×5 IMPLANT
HANDPIECE INTERPULSE COAX TIP (DISPOSABLE) ×2
HOOD PEEL AWAY FACE SHEILD DIS (HOOD) ×6 IMPLANT
KIT BASIN OR (CUSTOM PROCEDURE TRAY) ×2 IMPLANT
KIT ROOM TURNOVER OR (KITS) ×2 IMPLANT
MANIFOLD NEPTUNE II (INSTRUMENTS) ×2 IMPLANT
NS IRRIG 1000ML POUR BTL (IV SOLUTION) ×2 IMPLANT
PACK TOTAL JOINT (CUSTOM PROCEDURE TRAY) ×2 IMPLANT
PAD ARMBOARD 7.5X6 YLW CONV (MISCELLANEOUS) ×3 IMPLANT
PADDING CAST COTTON 6X4 STRL (CAST SUPPLIES) ×1 IMPLANT
SET HNDPC FAN SPRY TIP SCT (DISPOSABLE) ×1 IMPLANT
SPONGE GAUZE 4X4 12PLY (GAUZE/BANDAGES/DRESSINGS) ×3 IMPLANT
STAPLER VISISTAT 35W (STAPLE) ×2 IMPLANT
SUCTION FRAZIER TIP 10 FR DISP (SUCTIONS) ×2 IMPLANT
SURGIFLO TRUKIT (HEMOSTASIS) IMPLANT
SUT VIC AB 0 CTX 36 (SUTURE) ×2
SUT VIC AB 0 CTX36XBRD ANTBCTR (SUTURE) ×1 IMPLANT
SUT VIC AB 1 CTX 36 (SUTURE) ×2
SUT VIC AB 1 CTX36XBRD ANBCTR (SUTURE) ×1 IMPLANT
SUT VIC AB 2-0 CT1 27 (SUTURE) ×2
SUT VIC AB 2-0 CT1 TAPERPNT 27 (SUTURE) ×1 IMPLANT
TOWEL OR 17X24 6PK STRL BLUE (TOWEL DISPOSABLE) ×2 IMPLANT
TOWEL OR 17X26 10 PK STRL BLUE (TOWEL DISPOSABLE) ×2 IMPLANT
TRAY FOLEY BAG SILVER LF 14FR (CATHETERS) ×1 IMPLANT
TRAY FOLEY CATH 14FR (SET/KITS/TRAYS/PACK) IMPLANT
WATER STERILE IRR 1000ML POUR (IV SOLUTION) ×6 IMPLANT

## 2011-08-30 NOTE — Op Note (Signed)
PATIENT ID:      [name]  MRN:     [id number] DOB/AGE:    1951-05-11 / 60 y.o.       OPERATIVE REPORT    DATE OF PROCEDURE:  08/30/2011       PREOPERATIVE DIAGNOSIS:   OSTEOARTHRITIS RIGHT KNEE      Estimated Body mass index is 32.37 kg/(m^2) as calculated from the following:   Height as of 12/24/09: 5' 5.5"(1.664 m).   Weight as of 01/22/10: 197 lb 8 oz(89.585 kg).                                                        POSTOPERATIVE DIAGNOSIS:   OSTEOARTHRITIS RIGHT KNEE                                                                      PROCEDURE:  Procedure(s): Right TOTAL KNEE ARTHROPLASTY Using Depuy Sigma RP implants #4N Femur, #3Tibia, 10mm sigma RP bearing, 38 Patella     SURGEON: Krystale Rinkenberger J    ASSISTANT:   Shirl Harris PA-C   (Present and scrubbed throughout the case, critical for assistance with exposure, retraction, instrumentation, and closure.)         ANESTHESIA: GA combined with regional for post-op pain   DRAINS: (2) Hemovact drain(s) in the R knee with  Suction Open and Urinary Catheter (Foley)   TOURNIQUET TIME: * Missing tourniquet times found for documented tourniquets in log:  8323 *    COMPLICATIONS:  None     SPECIMENS: None   INDICATIONS FOR PROCEDURE: The patient has  OSTEOARTHRITIS RIGHT KNEE, varus deformities, XR shows bone on bone arthritis. Patient has failed all conservative measures including anti-inflammatory medicines, narcotics, attempts at  exercise and weight loss, cortisone injections and viscosupplementation.  Risks and benefits of surgery have been discussed, questions answered.   DESCRIPTION OF PROCEDURE: The patient identified by armband, received  right femoral nerve block and IV antibiotics, in the holding area at [facility]. Patient taken to the operating room, appropriate anesthetic  monitors were attached General endotracheal anesthesia induced with  the patient in supine position, Foley catheter was inserted.  Tourniquet  applied high to the operative thigh. Lateral post and foot positioner  applied to the table, the lower extremity was then prepped and draped  in usual sterile fashion from the ankle to the tourniquet. Time-out procedure was performed. The limb was wrapped with an Esmarch bandage and the tourniquet inflated to 350 mmHg. We began the operation by making the anterior midline incision starting at  handbreadth above the patella going over the patella 1 cm medial to and  4 cm distal to the tibial tubercle. Small bleeders in the skin and the  subcutaneous tissue identified and cauterized. Transverse retinaculum was incised and reflected medially and a medial parapatellar arthrotomy was accomplished. the patella was everted and theprepatellar fat pad resected. The superficial medial collateral  ligament was then elevated from anterior to posterior along the proximal  flare of the tibia and anterior half of the menisci resected.  The knee was hyperflexed exposing bone on bone arthritis. Peripheral and notch osteophytes as well as the cruciate ligaments were then resected. We continued to  work our way around posteriorly along the proximal tibia, and externally  rotated the tibia subluxing it out from underneath the femur. A McHale  retractor was placed through the notch and a lateral Hohmann retractor  placed, and we then drilled through the proximal tibia in line with the  axis of the tibia followed by an intramedullary guide rod and 2-degree  posterior slope cutting guide. The tibial cutting guide was pinned into place  allowing resection of 8 mm of bone medially and about 8 mm of bone  laterally because of her neutral deformity. Satisfied with the tibial resection, we then  entered the distal femur 2 mm anterior to the PCL origin with the  intramedullary guide rod and applied the distal femoral cutting guide  set at 11mm, with 5 degrees of valgus. This was pinned along the  epicondylar axis.  At this point, the distal femoral cut was accomplished without difficulty. We then sized for a #4N femoral component and pinned the guide in 3 degrees of external rotation.The chamfer cutting guide was pinned into place. The anterior, posterior, and chamfer cuts were accomplished without difficulty followed by  the Sigma RP box cutting guide and the box cut. We also removed posterior osteophytes from the posterior femoral condyles. At this  time, the knee was brought into full extension. We checked our  extension and flexion gaps and found them symmetric at 10mm.  The patella thickness measured at 23 mm. We felt a 38 patella would  fit. We set the cutting guide at 15 and removed the posterior 9.5-10 mm  of the patella sized for 38 button and drilled the lollipop. The knee  was then once again hyperflexed exposing the proximal tibia. We sized for a #3 tibial base plate, applied the smokestack and the conical reamer followed by the the Delta fin keel punch. We then hammered into place the Sigma RP trial femoral component, inserted a 10-mm trial bearing, trial patellar button, and took the knee through range of motion from 0-130 degrees. No thumb pressure was required for patellar  tracking. At this point, all trial components were removed, a double batch of DePuy HV cement with 1500 mg of Zinacef was mixed and applied to all bony metallic mating surfaces except for the posterior condyles of the femur itself. In order, we  hammered into place the tibial tray and removed excess cement, the femoral component and removed excess cement, a 10-mm Sigma RP bearing  was inserted, and the knee brought to full extension with compression.  The patellar button was clamped into place, and excess cement  removed. While the cement cured the wound was irrigated out with normal saline solution pulse lavage, and medium Hemovac drains were placed from an anterolateral  approach. Ligament stability and patellar tracking were  checked and found to be excellent. The parapatellar arthrotomy was closed with  running #1 Vicryl suture. The subcutaneous tissue with 0 and 2-0 undyed  Vicryl suture, and the skin with skin staples. A dressing of Xeroform,  4 x 4, dressing sponges, Webril, and Ace wrap applied. The patient  awakened, extubated, and taken to recovery room without difficulty.   Patrick Sohm J 08/30/2011, 9:24 AM

## 2011-08-30 NOTE — Anesthesia Postprocedure Evaluation (Signed)
  Anesthesia Post-op Note  Patient: [name]  Procedure(s) Performed:  TOTAL KNEE ARTHROPLASTY - Right Total Knee Arthroplasty  Patient Location: PACU  Anesthesia Type: GA combined with regional for post-op pain  Level of Consciousness: awake  Airway and Oxygen Therapy: Patient Spontanous Breathing  Post-op Pain: mild  Post-op Assessment: Post-op Vital signs reviewed  Post-op Vital Signs: stable  Complications: No apparent anesthesia complications

## 2011-08-30 NOTE — Preoperative (Signed)
Beta Blockers   Reason not to administer Beta Blockers:Pt took B blocker @ 0400 08/30/11

## 2011-08-30 NOTE — Interval H&P Note (Signed)
History and Physical Interval Note:  08/30/2011 7:22 AM  [name]  has presented today for surgery, with the diagnosis of OSTEOARTHRITIS RIGHT KNEE  The various methods of treatment have been discussed with the patient and family. After consideration of risks, benefits and other options for treatment, the patient has consented to  Procedure(s): Right TOTAL KNEE ARTHROPLASTY as a surgical intervention .  The patients' history has been reviewed, patient examined, no change in status, stable for surgery.  I have reviewed the patients' chart and labs.  Questions were answered to the patient's satisfaction.  She has done well with L total knee arthroplasty 6 years ago.   Nestor Lewandowsky

## 2011-08-30 NOTE — Anesthesia Preprocedure Evaluation (Addendum)
Anesthesia Evaluation  Patient identified by MRN, date of birth, ID band Patient awake    Reviewed: Allergy & Precautions, H&P , NPO status , Patient's Chart, lab work & pertinent test results, reviewed documented beta blocker date and time   Airway Mallampati: I TM Distance: >3 FB Neck ROM: Full    Dental  (+) Edentulous Upper and Partial Lower   Pulmonary  clear to auscultation        Cardiovascular hypertension, Pt. on medications and Pt. on home beta blockers + Valvular Problems/Murmurs Regular Normal+ Systolic murmurs    Neuro/Psych  Neuromuscular disease    GI/Hepatic GERD-  Medicated,(+) Hepatitis - (Pt unsure)  Endo/Other    Renal/GU      Musculoskeletal   Abdominal   Peds  Hematology   Anesthesia Other Findings   Reproductive/Obstetrics                        Anesthesia Physical Anesthesia Plan  ASA: III  Anesthesia Plan: Regional and General   Post-op Pain Management:    Induction: Intravenous  Airway Management Planned: Oral ETT  Additional Equipment:   Intra-op Plan:   Post-operative Plan: Extubation in OR  Informed Consent: I have reviewed the patients History and Physical, chart, labs and discussed the procedure including the risks, benefits and alternatives for the proposed anesthesia with the patient or authorized representative who has indicated his/her understanding and acceptance.     Plan Discussed with: CRNA and Surgeon  Anesthesia Plan Comments:         Anesthesia Quick Evaluation

## 2011-08-30 NOTE — Anesthesia Procedure Notes (Addendum)
Anesthesia Regional Block:  Femoral nerve block  Pre-Anesthetic Checklist: ,, timeout performed, Correct Patient, Correct Site, Correct Laterality, Correct Procedure, Correct Position, site marked, Risks and benefits discussed, at surgeon's request and post-op pain management  Laterality: Right  Prep: Betadine       Needles:  Injection technique: Single-shot  Needle Type: Stimulator Needle - 80      Needle Gauge: 22 and 22 G  Needle insertion depth: 6 cm   Additional Needles:  Procedures: nerve stimulator Femoral nerve block  Nerve Stimulator or Paresthesia:  Response: Twitch elicited, 0.8 mA,   Additional Responses:   Narrative:  Start time: 08/30/2011 7:15 AM End time: 08/30/2011 7:25 AM Injection made incrementally with aspirations every 5 mL.  Performed by: Personally  Anesthesiologist: Alma Friendly, MD  Additional Notes: BP cuff, EKG monitors applied. Sedation begun. Femoral artery palpated for location of nerve. After nerve location anesthetic injected incrementally, slowly , and after neg aspirations. Tolerated well.  Femoral nerve block Procedure Name: Intubation Date/Time: 08/30/2011 7:53 AM Performed by: Rossie Muskrat Pre-anesthesia Checklist: Patient identified, Timeout performed, Emergency Drugs available, Suction available and Patient being monitored Patient Re-evaluated:Patient Re-evaluated prior to inductionOxygen Delivery Method: Circle System Utilized Preoxygenation: Pre-oxygenation with 100% oxygen Intubation Type: IV induction Ventilation: Mask ventilation without difficulty Laryngoscope Size: Miller and 3 Grade View: Grade I Tube type: Oral Tube size: 7.0 mm Number of attempts: 1 Airway Equipment and Method: stylet Placement Confirmation: ETT inserted through vocal cords under direct vision,  breath sounds checked- equal and bilateral and positive ETCO2 Secured at: 20 cm Tube secured with: Tape Dental Injury: Teeth and Oropharynx as per  pre-operative assessment

## 2011-08-30 NOTE — Progress Notes (Signed)
ANTICOAGULATION CONSULT NOTE - Initial Consult  Pharmacy Consult for Coumadin Indication: VTE prophylaxis  Allergies  Allergen Reactions  . Codeine Other (See Comments)    Unknown reaction  . Iodine   . Iohexol      Code: HIVES, Desc: PT IS ALLERGIC TO IODINATED CONTRAST; REACTION INCLUDES FACIAL SWELLING,HIVES,RESPIRATORY DISTRESS;PT HASN'T HAD SCAN W/PREMEDS.;REFUSED CONTRAST OF ANY KIND!  KR Per pt; she never has had IV iodine; allergic to topical iodine--rash; no premeds for 05/07/11 scan per Dr Rito Ehrlich; no reaction; slg  . Latex Itching  . Milk-Related Compounds   . Penicillins Other (See Comments)    Unknown reaction  . Tetracycline Other (See Comments)    Unknown reaction    Patient Measurements:   Adjusted Body Weight:  Vital Signs: Temp: 98.9 F (37.2 C) (12/10 1207) Temp src: Oral (12/10 0607) BP: 123/73 mmHg (12/10 1207) Pulse Rate: 60  (12/10 1207)  Labs: No results found for this basename: HGB:2,HCT:3,PLT:3,APTT:3,LABPROT:3,INR:3,HEPARINUNFRC:3,CREATININE:3,CKTOTAL:3,CKMB:3,TROPONINI:3 in the last 72 hours The CrCl is unknown because both a height and weight (above a minimum accepted value) are required for this calculation.  Medical History: Past Medical History  Diagnosis Date  . rt breast ca dx'd 12/2007    xrt comp; oral chemo ongoing  . Hypertension   . Heart murmur     functional murmur, echo in [place]- long time ago   . Lupus   . Blood transfusion     1986- post-partial hysterectomy  . Hepatitis     Hep. A- 1978  . Chronic kidney disease   . GERD (gastroesophageal reflux disease)     recently taken off anti reflux tx  . Arthritis     R knee, hands   . No pertinent past medical history     will see cardiac for review this week, 08/24/2011     Assessment: Patient is a 60 y.o F s/p right TKA.  To start Coumadin for VTE prophylaxis.  Goal of Therapy:  INR 2-3   Plan:  1) Coumadin 7.5 mg PO x1  Azriel Jakob P 08/30/2011,2:27 PM

## 2011-08-30 NOTE — Transfer of Care (Signed)
Immediate Anesthesia Transfer of Care Note  Patient: [name]  Procedure(s) Performed:  TOTAL KNEE ARTHROPLASTY - Right Total Knee Arthroplasty  Patient Location: PACU  Anesthesia Type: General  Level of Consciousness: awake, alert  and oriented  Airway & Oxygen Therapy: Patient Spontanous Breathing and Patient connected to nasal cannula oxygen  Post-op Assessment: Report given to PACU RN, Post -op Vital signs reviewed and stable and Patient moving all extremities  Post vital signs: Reviewed and stable  Complications: No apparent anesthesia complications

## 2011-08-31 LAB — CBC
HCT: 29.4 % — ABNORMAL LOW (ref 36.0–46.0)
Hemoglobin: 9.7 g/dL — ABNORMAL LOW (ref 12.0–15.0)
MCH: 29.8 pg (ref 26.0–34.0)
MCHC: 33 g/dL (ref 30.0–36.0)
MCV: 90.5 fL (ref 78.0–100.0)
Platelets: 156 10*3/uL (ref 150–400)
RBC: 3.25 MIL/uL — ABNORMAL LOW (ref 3.87–5.11)
RDW: 13 % (ref 11.5–15.5)
WBC: 7.8 10*3/uL (ref 4.0–10.5)

## 2011-08-31 LAB — BASIC METABOLIC PANEL
BUN: 6 mg/dL (ref 6–23)
CO2: 27 mEq/L (ref 19–32)
Calcium: 8.5 mg/dL (ref 8.4–10.5)
Chloride: 99 mEq/L (ref 96–112)
Creatinine, Ser: 0.59 mg/dL (ref 0.50–1.10)
GFR calc Af Amer: >90 mL/min (ref >90)
GFR calc non Af Amer: >90 mL/min (ref >90)
Glucose, Bld: 116 mg/dL — ABNORMAL HIGH (ref 70–99)
Potassium: 3.8 mEq/L (ref 3.5–5.1)
Sodium: 133 mEq/L — ABNORMAL LOW (ref 135–145)

## 2011-08-31 LAB — PROTIME-INR
INR: 1.19 (ref 0.00–1.49)
Prothrombin Time: 15.4 seconds — ABNORMAL HIGH (ref 11.6–15.2)

## 2011-08-31 MED ORDER — WARFARIN SODIUM 7.5 MG PO TABS
7.5000 mg | ORAL_TABLET | Freq: Once | ORAL | Status: AC
  Administered 2011-08-31: 7.5 mg via ORAL
  Filled 2011-08-31: qty 1

## 2011-08-31 MED ORDER — TAMOXIFEN CITRATE 10 MG PO TABS
20.0000 mg | ORAL_TABLET | ORAL | Status: DC
  Administered 2011-08-31: 20 mg via ORAL

## 2011-08-31 NOTE — Progress Notes (Signed)
Patient ID: [name], female   DOB: 1950/12/04, 60 y.o.   MRN: [id number] PATIENT ID: [name]  MRN: [id number]  DOB/AGE:  09/12/1951 / 60 y.o.  1 Day Post-Op Procedure(s) (LRB): TOTAL KNEE ARTHROPLASTY (Right)    PROGRESS NOTE Subjective: Patient is alert, oriented, no Nausea, 1x Vomiting, yes passing gas, no Bowel Movement. Taking PO sips. Denies SOB, Chest or Calf Pain. Using Incentive Spirometer, PAS in place. Ambulate today WBAT, CPM 0-30 Patient reports pain as 8 on 0-10 scale  .    Objective: Vital signs in last 24 hours: Filed Vitals:   08/30/11 1207 08/30/11 1540 08/30/11 2152 08/30/11 2209  BP: 123/73 118/70 134/74 124/70  Pulse: 60 69 62 60  Temp: 98.9 F (37.2 C) 98.6 F (37 C)  97.7 F (36.5 C)  TempSrc:    Oral  Resp: 18 18 20 16   SpO2: 96% 96%  100%      Intake/Output from previous day: I/O last 3 completed shifts: In: 4640 [P.O.:240; I.V.:3400; Other:500; IV Piggyback:500] Out: 1450 [Urine:850; Drains:550; Blood:50]   Intake/Output this shift:     LABORATORY DATA:  Basename 08/31/11 0545  WBC 7.8  HGB 9.7*  HCT 29.4*  PLT 156  NA --  K --  CL --  CO2 --  BUN --  CREATININE --  GLUCOSE --  GLUCAP --  INR 1.19  CALCIUM --    Examination: Neurologically intact ABD soft Neurovascular intact Sensation intact distally Intact pulses distally Dorsiflexion/Plantar flexion intact Incision: dressing C/D/I No cellulitis present Compartment soft}  Assessment:   1 Day Post-Op Procedure(s) (LRB): TOTAL KNEE ARTHROPLASTY (Right)  Plan: PT/OT WBAT, CPM 5/hrs day until ROM 0-90 degrees, then D/C CPM DVT Prophylaxis:  Lovenox\Coumadin bridge target INR 1.5-2.0 DISCHARGE PLAN: Skilled Nursing Facility/Rehab DISCHARGE NEEDS: HHPT, HHRN, CPM, Walker and 3-in-1 comode seat     Carrye Goller J 08/31/2011, 7:15 AM

## 2011-08-31 NOTE — Progress Notes (Signed)
Occupational Therapy Evaluation Patient Details Name: [name] MRN: [id number] DOB: November 08, 1950 Today's Date: 08/31/2011  Problem List:  Patient Active Problem List  Diagnoses  . CARCINOMA, BREAST, RIGHT  . OBESITY  . NEUROPATHY  . CERUMEN IMPACTION, RIGHT  . OTITIS MEDIA, RIGHT  . RHEUMATIC FEVER  . HYPERTENSION, BENIGN ESSENTIAL  . GERD  . HIATAL HERNIA  . DIVERTICULOSIS, COLON  . CONSTIPATION  . LUPUS  . ARTHRALGIA  . HIP PAIN, RIGHT  . FATIGUE  . COUGH  . DYSURIA  . ABDOMINAL PAIN  . MAMMOGRAM, ABNORMAL, RIGHT  . Osteoarthritis of right knee    Past Medical History:  Past Medical History  Diagnosis Date  . rt breast ca dx'd 12/2007    xrt comp; oral chemo ongoing  . Hypertension   . Heart murmur     functional murmur, echo in [place]- long time ago   . Lupus   . Blood transfusion     1986- post-partial hysterectomy  . Hepatitis     Hep. A- 1978  . Chronic kidney disease   . GERD (gastroesophageal reflux disease)     recently taken off anti reflux tx  . Arthritis     R knee, hands   . No pertinent past medical history     will see cardiac for review this week, 08/24/2011   Past Surgical History:  Past Surgical History  Procedure Date  . Joint replacement     L knee replacement- 2006, arthroscopic surg. both knees   . Mastectomy     R breast- lumpectomy -2009  . Tonsillectomy     1954  . Appendectomy     1978 & tubal ligation   . Abdominal hysterectomy     1986  . Tubal ligation   . Breast surgery     R breast lumpectomy  . Cholecystectomy     1978- open     OT Assessment/Plan/Recommendation OT Assessment Clinical Impression Statement: 60 yo female s/p R TKA with decr mobility, decr BADLs, decr activity tolerance that could benefit fomr skilled ot acutely to maximize independence to decr burden of care at d/c to SNF OT Recommendation/Assessment: Patient will need skilled OT in the acute care venue OT Problem List: Decreased  strength;Decreased activity tolerance;Impaired balance (sitting and/or standing);Decreased safety awareness;Decreased knowledge of use of DME or AE;Pain OT Therapy Diagnosis : Acute pain OT Plan OT Frequency: Min 2X/week OT Treatment/Interventions: Self-care/ADL training;DME and/or AE instruction;Therapeutic activities;Patient/family education;Balance training OT Recommendation Follow Up Recommendations: Skilled nursing facility Equipment Recommended: Defer to next venue Individuals Consulted Consulted and Agree with Results and Recommendations: Patient OT Goals Acute Rehab OT Goals OT Goal Formulation: With patient Time For Goal Achievement: 2 weeks ADL Goals Pt Will Perform Lower Body Dressing: with min assist;with adaptive equipment;Supported;Sit to stand from bed;Sit to stand from chair Pt Will Transfer to Toilet: with max assist;Stand pivot transfer;Ambulation;3-in-1 Pt Will Perform Toileting - Clothing Manipulation: with min assist;Standing Pt Will Perform Toileting - Hygiene: with min assist;Sit to stand from 3-in-1/toilet Miscellaneous OT Goals Miscellaneous OT Goal #1: pt will perform bed mobility min A with hob elevated bed rails but no trapeze as precursor to BADLs  OT Evaluation Precautions/Restrictions  Precautions Precautions: Fall Restrictions Weight Bearing Restrictions: Yes RLE Weight Bearing: Weight bearing as tolerated Prior Functioning Home Living Lives With: Spouse Receives Help From: Family Type of Home: Apartment Home Layout: Two level Alternate Level Stairs-Rails: None Alternate Level Stairs-Number of Steps: 0 Home Access: Level entry Bathroom Shower/Tub:  Tub/shower unit;Curtain Teacher, early years/pre: Yes How Accessible: Accessible via walker Home Adaptive Equipment: Grab bars around toilet;Walker - rolling Prior Function Level of Independence: Independent with basic ADLs;Independent with homemaking with  ambulation;Independent with gait;Independent with transfers Able to Take Stairs?: Yes Driving: Yes Vocation: Retired ADL ADL Eating/Feeding: Performed;Modified independent Where Assessed - Eating/Feeding: Bed level Lower Body Dressing: Simulated;+1 Total assistance Lower Body Dressing Details (indicate cue type and reason): Pt unable to reach BIL LE  Where Assessed - Lower Body Dressing: Sitting, chair Toilet Transfer: Simulated;+2 Total assistance;Comment for patient % (50) Toilet Transfer Details (indicate cue type and reason): Pt requires max v/c and mod tactile cue to transfer chair<>bed Equipment Used: Rolling walker ADL Comments: Pt currently with foley. pt will required total +2 pt 50% for 3N1 transfer at bedside. pt will require total A with hyigene. Pt unable to release RW with standing Vision/Perception    Cognition Cognition Arousal/Alertness: Awake/alert Overall Cognitive Status: Appears within functional limits for tasks assessed Orientation Level: Oriented X4 Sensation/Coordination Sensation Light Touch: Appears Intact Coordination Gross Motor Movements are Fluid and Coordinated: Yes Fine Motor Movements are Fluid and Coordinated: Yes Extremity Assessment RUE Assessment RUE Assessment: Within Functional Limits LUE Assessment LUE Assessment: Within Functional Limits Mobility  Bed Mobility Bed Mobility: Yes Supine to Sit: 3: Mod assist;HOB elevated (Comment degrees);With rails (45) Supine to Sit Details (indicate cue type and reason): VC for sequencing. Assist with RLE and min assist of drawsheet to pivot into sitting. Pt with moaning througout secondary to pain Sitting - Scoot to Edge of Bed: 3: Mod assist;With rail Sitting - Scoot to [place] of Bed Details (indicate cue type and reason): VC for hand placement. Assist of RLE while scooting forward Sit to Supine - Right: 3: Mod assist;With rail;HOB flat (trapeze bar) Scooting to HOB: 3: Mod assist;With trapeze;With  rail Transfers Transfers: Yes Sit to Stand: With upper extremity assist;From chair/3-in-1;1: +2 Total assist;Patient percentage (comment) (30) Sit to Stand Details (indicate cue type and reason): max v/c for hand placement, facilitation at sacrum for upright posture and tactile cue to advance RLE Stand to Sit: 3: Mod assist;With upper extremity assist;To bed Stand to Sit Details: assist to control RLE and descend to bed Exercises Total Joint Exercises Ankle Circles/Pumps: AROM;Strengthening;Both;10 reps;Supine Quad Sets: AROM;Strengthening;Right;10 reps;Supine End of Session OT - End of Session Equipment Utilized During Treatment: Gait belt Activity Tolerance: Patient limited by pain Patient left: in bed;with call bell in reach Nurse Communication: Mobility status for transfers;Mobility status for ambulation (RN Sharyl Nimrod present) General Behavior During Session: [facility] for tasks performed Cognition: [facility] for tasks performed  Next session: toilet transfers and bed mobility   Harrel Carina [facility] 08/31/2011, 11:36 AM  Pager: [phone]

## 2011-08-31 NOTE — Plan of Care (Signed)
Problem: Phase I Progression Outcomes Goal: Pain controlled with appropriate interventions Outcome: Not Progressing Extremely limited by pain in OT evaluation

## 2011-08-31 NOTE — Progress Notes (Signed)
Physical Therapy Evaluation Patient Details Name: [name] MRN: [id number] DOB: [birth year] Today's Date: 08/31/2011  Problem List:  Patient Active Problem List  Diagnoses  . CARCINOMA, BREAST, RIGHT  . OBESITY  . NEUROPATHY  . CERUMEN IMPACTION, RIGHT  . OTITIS MEDIA, RIGHT  . RHEUMATIC FEVER  . HYPERTENSION, BENIGN ESSENTIAL  . GERD  . HIATAL HERNIA  . DIVERTICULOSIS, COLON  . CONSTIPATION  . LUPUS  . ARTHRALGIA  . HIP PAIN, RIGHT  . FATIGUE  . COUGH  . DYSURIA  . ABDOMINAL PAIN  . MAMMOGRAM, ABNORMAL, RIGHT  . Osteoarthritis of right knee    Past Medical History:  Past Medical History  Diagnosis Date  . rt breast ca dx'd 12/2007    xrt comp; oral chemo ongoing  . Hypertension   . Heart murmur     functional murmur, echo in [place]- long time ago   . Lupus   . Blood transfusion     1986- post-partial hysterectomy  . Hepatitis     Hep. A- 1978  . Chronic kidney disease   . GERD (gastroesophageal reflux disease)     recently taken off anti reflux tx  . Arthritis     R knee, hands   . No pertinent past medical history     will see cardiac for review this week, 08/24/2011   Past Surgical History:  Past Surgical History  Procedure Date  . Joint replacement     L knee replacement- 2006, arthroscopic surg. both knees   . Mastectomy     R breast- lumpectomy -2009  . Tonsillectomy     1954  . Appendectomy     1978 & tubal ligation   . Abdominal hysterectomy     1986  . Tubal ligation   . Breast surgery     R breast lumpectomy  . Cholecystectomy     1978- open     PT Assessment/Plan/Recommendation PT Assessment Clinical Impression Statement: Pt presents with a medical diagnosis of Right TKA along with the following impairments/deficits and therapy diagnosis listed below. Pt limited by increased pain with all mobility. Pt will benefit from skilled PT in the acute care setting in order to maximize functional mobility for a safe d/c to next venue of  care PT Recommendation/Assessment: Patient will need skilled PT in the acute care venue PT Problem List: Decreased strength;Decreased range of motion;Decreased activity tolerance;Decreased balance;Decreased knowledge of use of DME;Decreased knowledge of precautions;Pain;Decreased mobility Barriers to Discharge: Decreased caregiver support PT Therapy Diagnosis : Acute pain PT Plan PT Frequency: 7X/week PT Treatment/Interventions: DME instruction;Gait training;Stair training;Functional mobility training;Therapeutic activities;Therapeutic exercise;Balance training;Patient/family education PT Goals  Acute Rehab PT Goals PT Goal Formulation: With patient Time For Goal Achievement: 7 days Pt will go Supine/Side to Sit: with supervision PT Goal: Supine/Side to Sit - Progress: Progressing toward goal Pt will go Sit to Supine/Side: with supervision PT Goal: Sit to Supine/Side - Progress: Progressing toward goal Pt will go Sit to Stand: with min assist PT Goal: Sit to Stand - Progress: Progressing toward goal Pt will go Stand to Sit: with min assist PT Goal: Stand to Sit - Progress: Progressing toward goal Pt will Transfer Bed to Chair/Chair to Bed: with min assist PT Transfer Goal: Bed to Chair/Chair to Bed - Progress: Progressing toward goal Pt will Ambulate: with min assist;51 - 150 feet;with rolling walker PT Goal: Ambulate - Progress: Progressing toward goal Pt will Perform Home Exercise Program: Independently PT Goal: Perform Home Exercise Program -  Progress: Progressing toward goal  PT Evaluation Precautions/Restrictions  Restrictions Weight Bearing Restrictions: Yes RLE Weight Bearing: Weight bearing as tolerated Prior Functioning  Home Living Lives With: Spouse Receives Help From: Family Type of Home: Apartment Home Layout: Two level Alternate Level Stairs-Rails: None Alternate Level Stairs-Number of Steps: 0 (elevator) Home Access: Level entry Bathroom Shower/Tub: Tub/shower  unit;Curtain Bathroom Toilet: Standard Bathroom Accessibility: Yes How Accessible: Accessible via walker Home Adaptive Equipment: Grab bars around toilet;Walker - rolling Prior Function Level of Independence: Independent with basic ADLs;Independent with homemaking with ambulation;Independent with gait;Independent with transfers Able to Take Stairs?: Yes Driving: Yes Vocation: Retired Financial risk analyst Arousal/Alertness: Awake/alert Overall Cognitive Status: Appears within functional limits for tasks assessed Orientation Level: Oriented X4 Sensation/Coordination Sensation Light Touch: Appears Intact Extremity Assessment RLE Assessment RLE Assessment: Exceptions to [facility] RLE AROM (degrees) Overall AROM Right Lower Extremity: Deficits;Due to pain (Hip and Ankle WFL; Knee 0-60) RLE Strength RLE Overall Strength: Deficits;Due to pain (Hip and Ankle WFL; Mod assist with SLR) LLE Assessment LLE Assessment: Within Functional Limits Mobility (including Balance) Bed Mobility Bed Mobility: Yes Supine to Sit: 3: Mod assist;HOB elevated (Comment degrees);With rails (45) Supine to Sit Details (indicate cue type and reason): VC for sequencing. Assist with RLE and min assist of drawsheet to pivot into sitting. Pt with moaning througout secondary to pain Sitting - Scoot to Edge of Bed: 3: Mod assist;With rail Sitting - Scoot to [place] of Bed Details (indicate cue type and reason): VC for hand placement. Assist of RLE while scooting forward Transfers Transfers: Yes Sit to Stand: With upper extremity assist;From bed;1: +2 Total assist;Patient percentage (comment) (60) Sit to Stand Details (indicate cue type and reason): VC for hand placement and sequencing. Assist for stability into standing and cues for upright posture and leg placement for a stable stand. Pt with difficulty bearing weight and maintaining upright position. Stand to Sit: 3: Mod assist;To chair/3-in-1;With upper extremity  assist Stand to Sit Details: VC for hand and leg placement. Assist for controlled descent secondary to increased pain with RLE movement Stand Pivot Transfers: 1: +2 Total assist;Patient percentage (comment) (60) Stand Pivot Transfer Details (indicate cue type and reason): VC for sequencing with RW and safety. Assist x 2 for stability and guiding into chair. Ambulation/Gait Ambulation/Gait: No    Exercise  Total Joint Exercises Ankle Circles/Pumps: AROM;Strengthening;Both;10 reps;Supine Quad Sets: AROM;Strengthening;Right;10 reps;Supine End of Session PT - End of Session Equipment Utilized During Treatment: Gait belt Activity Tolerance: Patient limited by pain Patient left: in chair;with call bell in reach Nurse Communication: Mobility status for transfers;Mobility status for ambulation General Behavior During Session: [facility] for tasks performed Cognition: [facility] for tasks performed  Milana Kidney 08/31/2011, 10:37 AM  08/31/2011 Milana Kidney DPT PAGER: [phone] OFFICE: [phone]

## 2011-08-31 NOTE — Progress Notes (Signed)
ANTICOAGULATION CONSULT NOTE - Follow Up Consult  Pharmacy Consult for Coumadin Indication: VTE prophylaxis  Allergies  Allergen Reactions  . Codeine Other (See Comments)    Unknown reaction  . Iodine   . Iohexol      Code: HIVES, Desc: PT IS ALLERGIC TO IODINATED CONTRAST; REACTION INCLUDES FACIAL SWELLING,HIVES,RESPIRATORY DISTRESS;PT HASN'T HAD SCAN W/PREMEDS.;REFUSED CONTRAST OF ANY KIND!  KR Per pt; she never has had IV iodine; allergic to topical iodine--rash; no premeds for 05/07/11 scan per Dr Rito Ehrlich; no reaction; slg  . Latex Itching  . Milk-Related Compounds   . Penicillins Other (See Comments)    Unknown reaction  . Tetracycline Other (See Comments)    Unknown reaction    Patient Measurements:   Adjusted Body Weight:   Vital Signs: Temp: 99.3 F (37.4 C) (12/11 0640) BP: 92/47 mmHg (12/11 0640) Pulse Rate: 77  (12/11 0640)  Labs:  Basename 08/31/11 0545  HGB 9.7*  HCT 29.4*  PLT 156  APTT --  LABPROT 15.4*  INR 1.19  HEPARINUNFRC --  CREATININE 0.59  CKTOTAL --  CKMB --  TROPONINI --   The CrCl is unknown because both a height and weight (above a minimum accepted value) are required for this calculation.    Assessment: Patient is a 60 y.o F s/p right TKA on Coumadin for VTE prophylaxis.    Goal of Therapy:  INR 1.5-2   Plan:  1) Coumadin 7.5 mg PO x1 today  Kamau Weatherall P 08/31/2011,10:15 AM

## 2011-08-31 NOTE — Plan of Care (Signed)
Problem: Phase I Progression Outcomes Goal: Pain controlled with appropriate interventions Outcome: Progressing Pt has had much better pain control today vs yesterday.

## 2011-08-31 NOTE — Progress Notes (Signed)
Concurrent use of Tamoxifen and warfarin is contraindicated per Tamoxifen's package insert.  However, literature suggests the interaction between warfarin and tamoxifen may be similar to other drugs that intx with warfarin but data is limited.  I have placed a copy of an article I found in the patient's paper chart for you to review.  I have also spoken to Dr. Arlice Colt (oncall oncologist over at the cancer center for Dr. Welton Flakes who is her oncologist), he stated that he has use the combination of tamoxifen and warfarin before with no issues.  If you are concerned about this interaction, Dr. Arlice Colt said it's ok with him if you d/c her tamoxifen for one month.

## 2011-09-01 ENCOUNTER — Encounter (HOSPITAL_COMMUNITY): Payer: Self-pay | Admitting: Orthopedic Surgery

## 2011-09-01 LAB — CBC
HCT: 31 % — ABNORMAL LOW (ref 36.0–46.0)
Hemoglobin: 10.4 g/dL — ABNORMAL LOW (ref 12.0–15.0)
MCH: 30.1 pg (ref 26.0–34.0)
MCHC: 33.5 g/dL (ref 30.0–36.0)
MCV: 89.9 fL (ref 78.0–100.0)
Platelets: 163 10*3/uL (ref 150–400)
RBC: 3.45 MIL/uL — ABNORMAL LOW (ref 3.87–5.11)
RDW: 13 % (ref 11.5–15.5)
WBC: 9.3 10*3/uL (ref 4.0–10.5)

## 2011-09-01 LAB — PROTIME-INR
INR: 1.8 — ABNORMAL HIGH (ref 0.00–1.49)
Prothrombin Time: 21.2 seconds — ABNORMAL HIGH (ref 11.6–15.2)

## 2011-09-01 MED ORDER — METHOCARBAMOL 500 MG PO TABS: 500.0000 mg | ORAL_TABLET | Freq: Four times a day (QID) | ORAL | Status: AC | PRN

## 2011-09-01 MED ORDER — OXYCODONE-ACETAMINOPHEN 5-325 MG PO TABS: 1.0000 | ORAL_TABLET | ORAL | Status: AC | PRN

## 2011-09-01 MED ORDER — WARFARIN SODIUM 5 MG PO TABS: 5.0000 mg | ORAL_TABLET | Freq: Every day | ORAL | Status: DC

## 2011-09-01 MED ORDER — WARFARIN SODIUM 2 MG PO TABS
2.0000 mg | ORAL_TABLET | Freq: Once | ORAL | Status: AC
  Administered 2011-09-01: 2 mg via ORAL
  Filled 2011-09-01: qty 1

## 2011-09-01 NOTE — Progress Notes (Addendum)
ANTICOAGULATION CONSULT NOTE - Follow Up Consult  Pharmacy Consult for Coumadin Indication: VTE prophylaxis s/p R TKA  Allergies  Allergen Reactions  . Codeine Other (See Comments)    Unknown reaction  . Iodine   . Iohexol      Code: HIVES, Desc: PT IS ALLERGIC TO IODINATED CONTRAST; REACTION INCLUDES FACIAL SWELLING,HIVES,RESPIRATORY DISTRESS;PT HASN'T HAD SCAN W/PREMEDS.;REFUSED CONTRAST OF ANY KIND!  KR Per pt; she never has had IV iodine; allergic to topical iodine--rash; no premeds for 05/07/11 scan per Dr Rito Ehrlich; no reaction; slg  . Latex Itching  . Milk-Related Compounds   . Penicillins Other (See Comments)    Unknown reaction  . Tetracycline Other (See Comments)    Unknown reaction    Patient Measurements: Height: 5\' 7"  (170.2 cm) Weight: 198 lb 6.6 oz (90 kg) IBW/kg (Calculated) : 61.6    Vital Signs: Temp: 100 F (37.8 C) (12/12 0542) Temp src: Oral (12/12 0542) BP: 139/74 mmHg (12/12 0542) Pulse Rate: 79  (12/12 0542)  Labs:  Basename 09/01/11 0540 08/31/11 0545  HGB 10.4* 9.7*  HCT 31.0* 29.4*  PLT 163 156  APTT -- --  LABPROT 21.2* 15.4*  INR 1.80* 1.19  HEPARINUNFRC -- --  CREATININE -- 0.59  CKTOTAL -- --  CKMB -- --  TROPONINI -- --   Estimated Creatinine Clearance: 84 ml/min (by C-G formula based on Cr of 0.59).    Assessment: 60 y.o. F on warfarin for VTE px s/p R TKA on 12/10 with a therapeutic INR this a.m. (INR 1.8, goal of 1.5-2). Hgb/Hct/Plt stable, no s/sx of bleeding noted. Noted response by Shirl Harris, PA to tamoxifen/warfarin interaction by ortho team was to continue them both for now. Interaction documented in chart on 12/11 in pharmacist Anh Pham's note. Will reduce dose today given jump in INR and lower INR goal of 1.5-2.  Goal of Therapy:  INR 1.5-2   Plan:  1) Warfarin 2 mg x 1 dose at 1800 today 2) D/c Lovenox as INR 1.8 today. 3) Will continue to monitor for any signs/symptoms of bleeding and will follow up with  PT/INR in the a.m.   Rolley Sims 09/01/2011,1:44 PM

## 2011-09-01 NOTE — Progress Notes (Signed)
CARE MANAGEMENT NOTE 09/01/2011  Discharge planning. Patient preoperatively setup with [facility]. Will be going to [facility] for [employer].Social Worker aware.

## 2011-09-01 NOTE — Progress Notes (Signed)
FL2 placed in shadow chart in [facility]. Will proceed with SNF and [facility] Medicare Authorization. Reece Levy, MSW, LCSWA (for Donnamarie Poag)

## 2011-09-01 NOTE — Progress Notes (Signed)
Physical Therapy Treatment Patient Details Name: [name] MRN: [id number] DOB: October 25, 1950 Today's Date: 09/01/2011  PT Assessment/Plan  PT - Assessment/Plan Comments on Treatment Session: Pt progressing well. she was able to ambulate today and complete exercises with less pain. Will attempt increased ambulation tomorrow.  PT Plan: Discharge plan remains appropriate PT Frequency: 7X/week Follow Up Recommendations: Skilled nursing facility Equipment Recommended: Defer to next venue PT Goals  Acute Rehab PT Goals PT Goal Formulation: With patient Time For Goal Achievement: 7 days PT Goal: Supine/Side to Sit - Progress: Progressing toward goal PT Goal: Sit to Supine/Side - Progress: Progressing toward goal PT Goal: Sit to Stand - Progress: Met PT Goal: Stand to Sit - Progress: Met PT Transfer Goal: Bed to Chair/Chair to Bed - Progress: Met PT Goal: Ambulate - Progress: Met PT Goal: Perform Home Exercise Program - Progress: Progressing toward goal  PT Treatment Precautions/Restrictions  Precautions Precautions: Fall Restrictions Weight Bearing Restrictions: Yes RLE Weight Bearing: Weight bearing as tolerated Mobility (including Balance) Bed Mobility Bed Mobility: No Transfers Transfers: Yes Sit to Stand: 4: Min assist;With upper extremity assist;From chair/3-in-1 Sit to Stand Details (indicate cue type and reason): VC for handplacement for safety to RW. Pt able to maintain stability yet still required VC for postural in order for upright standing for balance. Stand to Sit: 4: Min assist;With upper extremity assist;To chair/3-in-1;To bed Stand to Sit Details: VC for hand placement and RLE placement. Controlled descent Ambulation/Gait Ambulation/Gait: Yes Ambulation/Gait Assistance: 4: Min assist (Minguard assist) Ambulation/Gait Assistance Details (indicate cue type and reason): VC for upright posture throughout ambulation as well as for normal sequencing for safety.    Ambulation Distance (Feet): 100 Feet Assistive device: Rolling walker Gait Pattern: Step-to pattern;Decreased step length - left;Decreased stance time - right;Decreased hip/knee flexion - right;Trunk flexed Gait velocity: Decreased gait speed Stairs: No    Exercise  Total Joint Exercises Hip ABduction/ADduction: AAROM;Strengthening;Right;10 reps;Supine (assist for weight of leg only) Straight Leg Raises: AAROM;Strengthening;Right;10 reps;Supine (assist for weight of leg only) Long Arc Quad: AAROM;Strengthening;Right;10 reps;Seated (assist with weight of leg only) Knee Flexion: AROM;Strengthening;Right;10 reps;Seated End of Session PT - End of Session Equipment Utilized During Treatment: Gait belt Activity Tolerance: Patient tolerated treatment well Patient left: in chair;with call bell in reach Nurse Communication: Mobility status for transfers;Mobility status for ambulation General Behavior During Session: [facility] for tasks performed Cognition: [facility] for tasks performed  Milana Kidney 09/01/2011, 12:09 PM  09/01/2011 Milana Kidney DPT PAGER: [phone] OFFICE: [phone]

## 2011-09-01 NOTE — Progress Notes (Signed)
PATIENT ID: [name]  MRN: [id number]  DOB/AGE:  1951/06/17 / 60 y.o.  2 Days Post-Op Procedure(s) (LRB): TOTAL KNEE ARTHROPLASTY (Right)    PROGRESS NOTE Subjective: Patient is alert, oriented, no Nausea, no Vomiting, yes passing gas, no Bowel Movement. Taking PO well. Denies SOB, Chest or Calf Pain. Using Incentive Spirometer, PAS in place. Ambulating well, using CPM Patient reports pain as moderate  .    Objective: Vital signs in last 24 hours: Filed Vitals:   08/31/11 1400 08/31/11 2100 08/31/11 2121 09/01/11 0542  BP: 149/79  133/63 139/74  Pulse: 64  74 79  Temp: 99.3 F (37.4 C)  99.8 F (37.7 C) 100 F (37.8 C)  TempSrc:   Oral Oral  Resp: 18  18 18   Height:  5\' 7"  (1.702 m)    Weight:  90 kg (198 lb 6.6 oz)    SpO2: 95%  100% 100%      Intake/Output from previous day: I/O last 3 completed shifts: In: 5595 [P.O.:720; I.V.:4875] Out: 2900 [Urine:2350; Drains:550]   Intake/Output this shift:     LABORATORY DATA:  Basename 09/01/11 0540 08/31/11 0545  WBC 9.3 7.8  HGB 10.4* 9.7*  HCT 31.0* 29.4*  PLT 163 156  NA -- 133*  K -- 3.8  CL -- 99  CO2 -- 27  BUN -- 6  CREATININE -- 0.59  GLUCOSE -- 116*  GLUCAP -- --  INR 1.80* 1.19  CALCIUM -- 8.5    Examination: Neurologically intact ABD soft Neurovascular intact Sensation intact distally Incision: dressing C/D/I}  Assessment:   2 Days Post-Op Procedure(s) (LRB): TOTAL KNEE ARTHROPLASTY (Right) ADDITIONAL DIAGNOSIS:  none  Plan: PT/OT WBAT, CPM 5/hrs day until ROM 0-90 degrees, then D/C CPM DVT Prophylaxis:  Lovenox\Coumadin bridge target INR 1.5-2.0 DISCHARGE PLAN: Skilled Nursing Facility/Rehab on Thursday if bed available DISCHARGE NEEDS: HHPT, HHRN, CPM, Walker and 3-in-1 comode seat     Kowen Kluth M. 09/01/2011, 8:10 AM

## 2011-09-02 LAB — CBC
HCT: 29.1 % — ABNORMAL LOW (ref 36.0–46.0)
Hemoglobin: 9.7 g/dL — ABNORMAL LOW (ref 12.0–15.0)
MCH: 29.9 pg (ref 26.0–34.0)
MCHC: 33.3 g/dL (ref 30.0–36.0)
MCV: 89.8 fL (ref 78.0–100.0)
Platelets: 149 10*3/uL — ABNORMAL LOW (ref 150–400)
RBC: 3.24 MIL/uL — ABNORMAL LOW (ref 3.87–5.11)
RDW: 13 % (ref 11.5–15.5)
WBC: 7.2 10*3/uL (ref 4.0–10.5)

## 2011-09-02 LAB — PROTIME-INR
INR: 2.32 — ABNORMAL HIGH (ref 0.00–1.49)
Prothrombin Time: 25.9 seconds — ABNORMAL HIGH (ref 11.6–15.2)

## 2011-09-02 MED ORDER — WARFARIN SODIUM 2 MG PO TABS
2.0000 mg | ORAL_TABLET | Freq: Once | ORAL | Status: DC
  Filled 2011-09-02: qty 1

## 2011-09-02 NOTE — Progress Notes (Signed)
Patient ID: [name], female   DOB: December 22, 1950, 60 y.o.   MRN: [id number] PATIENT ID: [name]  MRN: [id number]  DOB/AGE:  [birth year] / 60 y.o.  3 Days Post-Op Procedure(s) (LRB): TOTAL KNEE ARTHROPLASTY (Right)    PROGRESS NOTE Subjective: Patient is alert, oriented, no Nausea, no Vomiting, yes passing gas, no Bowel Movement. Taking PO well. Denies SOB, Chest or Calf Pain. Using Incentive Spirometer, PAS in place. Ambulate in hallway, CPM 0-65 Patient reports pain as 4 on 0-10 scale  .    Objective: Vital signs in last 24 hours: Filed Vitals:   09/01/11 1400 09/01/11 2100 09/02/11 0515 09/02/11 0609  BP: 136/80 110/55 118/61 106/80  Pulse: 82 93 88 87  Temp: 99.7 F (37.6 C) 98 F (36.7 C) 99.2 F (37.3 C) 99.1 F (37.3 C)  TempSrc:  Oral Oral Oral  Resp: 16 18 18 18   Height:      Weight:      SpO2: 99% 100% 100% 100%      Intake/Output from previous day: I/O last 3 completed shifts: In: 3001.3 [P.O.:1320; I.V.:1681.3] Out: 4 [Urine:4]   Intake/Output this shift:     LABORATORY DATA:  Basename 09/02/11 0545 09/01/11 0540 08/31/11 0545  WBC -- 9.3 7.8  HGB -- 10.4* 9.7*  HCT -- 31.0* 29.4*  PLT -- 163 156  NA -- -- 133*  K -- -- 3.8  CL -- -- 99  CO2 -- -- 27  BUN -- -- 6  CREATININE -- -- 0.59  GLUCOSE -- -- 116*  GLUCAP -- -- --  INR 2.32* 1.80* --  CALCIUM -- -- 8.5    Examination: Neurologically intact ABD soft Neurovascular intact Sensation intact distally Intact pulses distally Dorsiflexion/Plantar flexion intact Incision: dressing C/D/I No cellulitis present Compartment soft}  Assessment:   3 Days Post-Op Procedure(s) (LRB): TOTAL KNEE ARTHROPLASTY (Right)  Plan: PT/OT WBAT, CPM 5/hrs day until ROM 0-90 degrees, then D/C CPM DVT Prophylaxis:  Lovenox\Coumadin bridge target INR 1.5-2.0 DISCHARGE PLAN: Skilled Nursing Facility/Rehab [facility] DISCHARGE NEEDS: HHPT, HHRN, CPM, Walker and 3-in-1 comode seat       Jalesia Loudenslager J 09/02/2011, 8:04 AM

## 2011-09-02 NOTE — Progress Notes (Signed)
Occupational Therapy Treatment Patient Details Name: [name] MRN: [id number] DOB: 1951/08/27 Today's Date: 09/02/2011  OT Assessment/Plan OT Assessment/Plan OT Plan: Discharge plan remains appropriate OT Frequency: Min 2X/week Follow Up Recommendations: Skilled nursing facility Equipment Recommended: Defer to next venue OT Goals Acute Rehab OT Goals OT Goal Formulation: With patient Time For Goal Achievement: 2 weeks ADL Goals Pt Will Perform Lower Body Dressing: with min assist;with adaptive equipment;Supported;Sit to stand from bed;Sit to stand from chair ADL Goal: Lower Body Dressing - Progress: Progressing toward goals Pt Will Transfer to Toilet: with max assist;Stand pivot transfer;Ambulation;3-in-1 ADL Goal: Toilet Transfer - Progress: Met Pt Will Perform Toileting - Clothing Manipulation: with min assist;Standing ADL Goal: Toileting - Clothing Manipulation - Progress: Met Pt Will Perform Toileting - Hygiene: with min assist;Sit to stand from 3-in-1/toilet ADL Goal: Toileting - Hygiene - Progress: Met Miscellaneous OT Goals Miscellaneous OT Goal #1: pt will perform bed mobility min A with hob elevated bed rails but no trapeze as precursor to BADLs OT Goal: Miscellaneous Goal #1 - Progress: Progressing toward goals  OT Treatment Precautions/Restrictions  Precautions Precautions: Fall Restrictions Weight Bearing Restrictions: Yes RLE Weight Bearing: Weight bearing as tolerated   ADL ADL Grooming: Performed;Wash/dry hands;Set up Where Assessed - Grooming: Sitting, chair;Supported Location manager Bathing: Performed;Chest;Right arm;Left arm;Abdomen;Set up Where Assessed - Upper Body Bathing: Sitting, chair;Supported Lower Body Bathing: Performed;Minimal assistance Where Assessed - Lower Body Bathing: Sitting, chair;Supported Location manager Dressing: Performed;Set up Where Assessed - Upper Body Dressing: Sitting, chair;Supported Statistician: Performed;Moderate  assistance Toilet Transfer Details (indicate cue type and reason): pt requires mod v/c for sequence. pt liable during session at times. Toilet Transfer Method: Stand pivot;Ambulating Acupuncturist: Programme researcher, broadcasting/film/video Manipulation: Performed;Minimal assistance Where Assessed - Toileting Clothing Manipulation: Sit to stand from 3-in-1 or toilet Toileting - Hygiene: Performed;Minimal assistance Where Assessed - Toileting Hygiene: Sit to stand from 3-in-1 or toilet Equipment Used: Rolling walker Mobility  Bed Mobility Bed Mobility: Yes Supine to Sit: 4: Min assist;With rails;HOB flat Sitting - Scoot to [place] of Bed: 4: Min assist;With rail Transfers Transfers: Yes Sit to Stand: With upper extremity assist;From bed;3: Mod assist Stand to Sit: 4: Min assist;With upper extremity assist;To chair/3-in-1 Exercises    End of Session OT - End of Session Equipment Utilized During Treatment: Gait belt Activity Tolerance: Patient limited by pain Patient left: in chair;with call bell in reach Nurse Communication: Mobility status for transfers General Behavior During Session: [facility] for tasks performed Cognition: [facility] for tasks performed  [name]  09/02/2011, 11:16 AM Pager: [phone]

## 2011-09-02 NOTE — Progress Notes (Addendum)
Ok per MD for DC today to SNF rehab at [facility] via EMS. Discussed with patient and family; she is pleased with d/c plan. Notified pt's nurse of d/c. SNF obtained approval per [facility]. EMS auth obtained [id number].  No further needs identified. Darylene Price, BSW, 09/02/2011 1:42 PM

## 2011-09-02 NOTE — Discharge Summary (Signed)
Patient ID: [name] MRN: [id number] DOB/AGE: [birth year] 59 y.o.  Admit date: 08/30/2011 Discharge date: 09/02/2011  Admission Diagnoses:  Principal Problem:  *Osteoarthritis of right knee   Discharge Diagnoses:  Same  Past Medical History  Diagnosis Date  . rt breast ca dx'd 12/2007    xrt comp; oral chemo ongoing  . Hypertension   . Heart murmur     functional murmur, echo in [place]- long time ago   . Lupus   . Blood transfusion     1986- post-partial hysterectomy  . Hepatitis     Hep. A- 1978  . Chronic kidney disease   . GERD (gastroesophageal reflux disease)     recently taken off anti reflux tx  . Arthritis     R knee, hands   . No pertinent past medical history     will see cardiac for review this week, 08/24/2011    Surgeries: Procedure(s): TOTAL KNEE ARTHROPLASTY on 08/30/2011   Consultants:    Discharged Condition: Improved  Hospital Course: [name] is an 60 y.o. female who was admitted 08/30/2011 for operative treatment ofOsteoarthritis of right knee. Patient has severe unremitting pain that affects sleep, daily activities, and work/hobbies. After pre-op clearance the patient was taken to the operating room on 08/30/2011 and underwent  Procedure(s): TOTAL KNEE ARTHROPLASTY.    Patient was given perioperative antibiotics: Anti-infectives     Start     Dose/Rate Route Frequency Ordered Stop   08/30/11 0823   cefUROXime (ZINACEF) injection  Status:  Discontinued          As needed 08/30/11 0823 08/30/11 4098           Patient was given sequential compression devices, early ambulation, and chemoprophylaxis to prevent DVT.  Patient benefited maximally from hospital stay and there were no complications.    Recent vital signs: Patient Vitals for the past 24 hrs:  BP Temp Temp src Pulse Resp SpO2  09/02/11 0609 106/80 mmHg 99.1 F (37.3 C) Oral 87  18  100 %  09/02/11 0515 118/61 mmHg 99.2 F (37.3 C) Oral 88  18  100 %  09-30-2011  2100 110/55 mmHg 98 F (36.7 C) Oral 93  18  100 %  09-30-2011 1400 136/80 mmHg 99.7 F (37.6 C) - 82  16  99 %     Recent laboratory studies:  Basename 09/02/11 0545 09-30-2011 0540 08/31/11 0545  WBC -- 9.3 7.8  HGB -- 10.4* 9.7*  HCT -- 31.0* 29.4*  PLT -- 163 156  NA -- -- 133*  K -- -- 3.8  CL -- -- 99  CO2 -- -- 27  BUN -- -- 6  CREATININE -- -- 0.59  GLUCOSE -- -- 116*  INR 2.32* 1.80* --  CALCIUM -- -- 8.5     Discharge Medications:  Current Discharge Medication List    START taking these medications   Details  methocarbamol (ROBAXIN) 500 MG tablet Take 1 tablet (500 mg total) by mouth every 6 (six) hours as needed. Qty: 60 tablet, Refills: 0    oxyCODONE-acetaminophen (PERCOCET) 5-325 MG per tablet Take 1-2 tablets by mouth every 4 (four) hours as needed. Qty: 60 tablet, Refills: 0    warfarin (COUMADIN) 5 MG tablet Take 1 tablet (5 mg total) by mouth daily. Qty: 30 tablet, Refills: 0      CONTINUE these medications which have NOT CHANGED   Details  Ascorbic Acid (VITAMIN C) 1000 MG tablet Take 1,000 mg by  mouth 2 (two) times daily.      Cholecalciferol (VITAMIN D3) 2000 UNITS TABS Take 1 tablet by mouth daily.      hydrochlorothiazide (HYDRODIURIL) 25 MG tablet Take 25 mg by mouth daily.      lansoprazole (PREVACID) 30 MG capsule Take 30 mg by mouth 2 (two) times daily as needed.     metoprolol (TOPROL-XL) 50 MG 24 hr tablet Take 25 mg by mouth 2 (two) times daily.     Misc Natural Products (GLUCOSAMINE CHONDROITIN VIT D3 PO) Take 2 tablets by mouth daily.      omega-3 acid ethyl esters (LOVAZA) 1 G capsule Take 1 g by mouth 3 (three) times daily.      tamoxifen (NOLVADEX) 20 MG tablet Take 20 mg by mouth daily.        STOP taking these medications     HYDROcodone-acetaminophen (NORCO) 10-325 MG per tablet         Diagnostic Studies: Dg Chest 2 View  08/23/2011  *RADIOLOGY REPORT*  Clinical Data: Preop  CHEST - 2 VIEW  Comparison:  05/07/2011   Findings: Cardiomediastinal silhouette is stable.  No acute infiltrate or pleural effusion.  No pulmonary edema.  Minimal thoracic dextroscoliosis. Mild degenerative changes mid thoracic spine.  IMPRESSION: No active disease. Mild degenerative changes mid thoracic spine.  Original Report Authenticated By: Natasha Mead, M.D.    Disposition: SNF [facility] 1-2 weeks  Discharge Orders    Future Orders Please Complete By Expires   Diet - low sodium heart healthy      Increase activity slowly      Walker       May shower / Bathe      Driving Restrictions      Comments:   No driving for 2 weeks.   Change dressing (specify)      Comments:   Dressing change as needed.   Call MD for:  temperature >100.4      Call MD for:  severe uncontrolled pain      Call MD for:  redness, tenderness, or signs of infection (pain, swelling, redness, odor or green/yellow discharge around incision site)      Discharge instructions      Comments:   F/U with Dr. Turner Daniels in 10 days         Signed: Nestor Lewandowsky 09/02/2011, 8:17 AM

## 2011-09-02 NOTE — Progress Notes (Signed)
Patient was walking from sink to bed and slipped while backing up to her bed. She did not fall. She had to catch the edge of the bed. She was a little shaken but fine. When she called staff to the room, she was sitting on the side of her bed. Events were explained to [name] by her because they were not witnessed. Vital signs taken were stable. Prn pain meds were given. Will continue to monitor.

## 2011-09-03 LAB — TYPE AND SCREEN
ABO/RH(D): O POS
Antibody Screen: POSITIVE
DAT, IgG: NEGATIVE
Donor AG Type: NEGATIVE
Donor AG Type: NEGATIVE
Unit division: 0
Unit division: 0

## 2011-10-04 ENCOUNTER — Emergency Department (HOSPITAL_COMMUNITY): Payer: Medicare Other

## 2011-10-04 ENCOUNTER — Encounter (HOSPITAL_COMMUNITY): Payer: Self-pay | Admitting: *Deleted

## 2011-10-04 ENCOUNTER — Other Ambulatory Visit: Payer: Self-pay

## 2011-10-04 ENCOUNTER — Emergency Department (HOSPITAL_COMMUNITY)
Admission: EM | Admit: 2011-10-04 | Discharge: 2011-10-05 | Disposition: A | Discharge status: Home / Self Care | Payer: Medicare Other | Attending: Emergency Medicine | Admitting: Emergency Medicine

## 2011-10-04 DIAGNOSIS — M7989 Other specified soft tissue disorders: Secondary | ICD-10-CM | POA: Insufficient documentation

## 2011-10-04 DIAGNOSIS — Z79899 Other long term (current) drug therapy: Secondary | ICD-10-CM | POA: Insufficient documentation

## 2011-10-04 DIAGNOSIS — M329 Systemic lupus erythematosus, unspecified: Secondary | ICD-10-CM | POA: Insufficient documentation

## 2011-10-04 DIAGNOSIS — N189 Chronic kidney disease, unspecified: Secondary | ICD-10-CM | POA: Insufficient documentation

## 2011-10-04 DIAGNOSIS — M79604 Pain in right leg: Secondary | ICD-10-CM

## 2011-10-04 DIAGNOSIS — M129 Arthropathy, unspecified: Secondary | ICD-10-CM | POA: Insufficient documentation

## 2011-10-04 DIAGNOSIS — Z853 Personal history of malignant neoplasm of breast: Secondary | ICD-10-CM | POA: Insufficient documentation

## 2011-10-04 DIAGNOSIS — Z96659 Presence of unspecified artificial knee joint: Secondary | ICD-10-CM | POA: Insufficient documentation

## 2011-10-04 DIAGNOSIS — R079 Chest pain, unspecified: Secondary | ICD-10-CM | POA: Insufficient documentation

## 2011-10-04 DIAGNOSIS — K219 Gastro-esophageal reflux disease without esophagitis: Secondary | ICD-10-CM | POA: Insufficient documentation

## 2011-10-04 DIAGNOSIS — I129 Hypertensive chronic kidney disease with stage 1 through stage 4 chronic kidney disease, or unspecified chronic kidney disease: Secondary | ICD-10-CM | POA: Insufficient documentation

## 2011-10-04 MED ORDER — ASPIRIN 81 MG PO CHEW
324.0000 mg | CHEWABLE_TABLET | Freq: Once | ORAL | Status: AC
  Administered 2011-10-04: 324 mg via ORAL
  Filled 2011-10-04: qty 4

## 2011-10-04 NOTE — ED Notes (Addendum)
Pt in c/o chest pain x2 days, states pain is intermittent, pain increased tonight and became more constant and began to radiate down left arm, also nausea- pt also had a right total knee replacement 1 month ago and noted some numbness in right lower leg

## 2011-10-04 NOTE — ED Notes (Signed)
B/P 148/79  HR  66  R 18  Pulse Ox 99%--Dr. Kuhot in to assess pt.

## 2011-10-05 ENCOUNTER — Ambulatory Visit (HOSPITAL_COMMUNITY)
Admission: RE | Admit: 2011-10-05 | Discharge: 2011-10-05 | Disposition: A | Discharge status: Home / Self Care | Payer: Medicare Other | Source: Ambulatory Visit | Attending: Emergency Medicine | Admitting: Emergency Medicine

## 2011-10-05 ENCOUNTER — Emergency Department (HOSPITAL_COMMUNITY): Payer: Medicare Other

## 2011-10-05 DIAGNOSIS — M79609 Pain in unspecified limb: Secondary | ICD-10-CM

## 2011-10-05 DIAGNOSIS — R52 Pain, unspecified: Secondary | ICD-10-CM

## 2011-10-05 DIAGNOSIS — R079 Chest pain, unspecified: Secondary | ICD-10-CM | POA: Insufficient documentation

## 2011-10-05 LAB — COMPREHENSIVE METABOLIC PANEL
ALT: 15 U/L (ref 0–35)
AST: 22 U/L (ref 0–37)
Albumin: 3.4 g/dL — ABNORMAL LOW (ref 3.5–5.2)
Alkaline Phosphatase: 94 U/L (ref 39–117)
BUN: 9 mg/dL (ref 6–23)
CO2: 24 mEq/L (ref 19–32)
Calcium: 9.4 mg/dL (ref 8.4–10.5)
Chloride: 103 mEq/L (ref 96–112)
Creatinine, Ser: 0.6 mg/dL (ref 0.50–1.10)
GFR calc Af Amer: >90 mL/min (ref >90)
GFR calc non Af Amer: >90 mL/min (ref >90)
Glucose, Bld: 99 mg/dL (ref 70–99)
Potassium: 4.2 mEq/L (ref 3.5–5.1)
Sodium: 138 mEq/L (ref 135–145)
Total Bilirubin: 0.1 mg/dL — ABNORMAL LOW (ref 0.3–1.2)
Total Protein: 7.4 g/dL (ref 6.0–8.3)

## 2011-10-05 LAB — CBC
HCT: 34.8 % — ABNORMAL LOW (ref 36.0–46.0)
Hemoglobin: 11.4 g/dL — ABNORMAL LOW (ref 12.0–15.0)
MCH: 30.1 pg (ref 26.0–34.0)
MCHC: 32.8 g/dL (ref 30.0–36.0)
MCV: 91.8 fL (ref 78.0–100.0)
Platelets: 214 10*3/uL (ref 150–400)
RBC: 3.79 MIL/uL — ABNORMAL LOW (ref 3.87–5.11)
RDW: 13.5 % (ref 11.5–15.5)
WBC: 4.4 10*3/uL (ref 4.0–10.5)

## 2011-10-05 LAB — POCT I-STAT TROPONIN I: Troponin i, poc: 0 ng/mL (ref 0.00–0.08)

## 2011-10-05 MED ORDER — ENOXAPARIN SODIUM 100 MG/ML ~~LOC~~ SOLN
90.0000 mg | Freq: Once | SUBCUTANEOUS | Status: AC
  Administered 2011-10-05: 90 mg via SUBCUTANEOUS
  Filled 2011-10-05: qty 1

## 2011-10-05 MED ORDER — TECHNETIUM TO 99M ALBUMIN AGGREGATED
5.5000 | Freq: Once | INTRAVENOUS | Status: AC | PRN
  Administered 2011-10-05: 6 via INTRAVENOUS

## 2011-10-05 MED ORDER — XENON XE 133 GAS
11.0000 | GAS_FOR_INHALATION | Freq: Once | RESPIRATORY_TRACT | Status: AC | PRN
  Administered 2011-10-05: 11 via RESPIRATORY_TRACT

## 2011-10-05 NOTE — ED Provider Notes (Signed)
History    60yF with CP. Gradual onset about 2d ago and constant since. Lower sternal and L anterior chest. Seems to be worse with palpation to area. Denies trauma. Mild SOB. No fever or chills. No cough. Pt also complaining of R calf pain and leg swelling. Pt with total knee replacement 1 month ago. Knee pain has been improving since. Calf pain and swelling is acute in past couple days. Denies hx of blood clot.   CSN: [id number]  Arrival date & time 10/04/11  2142   First MD Initiated Contact with Patient 10/04/11 2220      Chief Complaint  Patient presents with  . Chest Pain  . Leg Pain    (Consider location/radiation/quality/duration/timing/severity/associated sxs/prior treatment) HPI  Past Medical History  Diagnosis Date  . rt breast ca dx'd 12/2007    xrt comp; oral chemo ongoing  . Hypertension   . Heart murmur     functional murmur, echo in [place]- long time ago   . Lupus   . Blood transfusion     1986- post-partial hysterectomy  . Hepatitis     Hep. A- 1978  . Chronic kidney disease   . GERD (gastroesophageal reflux disease)     recently taken off anti reflux tx  . Arthritis     R knee, hands   . No pertinent past medical history     will see cardiac for review this week, 08/24/2011    Past Surgical History  Procedure Date  . Joint replacement     L knee replacement- 2006, arthroscopic surg. both knees   . Mastectomy     R breast- lumpectomy -2009  . Tonsillectomy     1954  . Appendectomy     1978 & tubal ligation   . Abdominal hysterectomy     1986  . Tubal ligation   . Breast surgery     R breast lumpectomy  . Cholecystectomy     1978- open   . Total knee arthroplasty 08/30/2011    Procedure: TOTAL KNEE ARTHROPLASTY;  Surgeon: Nestor Lewandowsky;  Location: [facility] OR;  Service: Orthopedics;  Laterality: Right;  Right Total Knee Arthroplasty    Family History  Problem Relation Age of Onset  . Anesthesia problems Neg Hx   . Hypotension Neg Hx   . Malignant  hyperthermia Neg Hx   . Pseudochol deficiency Neg Hx     History  Substance Use Topics  . Smoking status: Never Smoker   . Smokeless tobacco: Not on file  . Alcohol Use: No    OB History    Grav Para Term Preterm Abortions TAB SAB Ect Mult Living                  Review of Systems   Review of symptoms negative unless otherwise noted in HPI.   Allergies  Codeine; Iodine; Iohexol; Latex; Milk-related compounds; Penicillins; and Tetracycline  Home Medications   Current Outpatient Rx  Name Route Sig Dispense Refill  . VITAMIN C 1000 MG PO TABS Oral Take 1,000 mg by mouth 2 (two) times daily.      Marland Kitchen VITAMIN D3 2000 UNITS PO TABS Oral Take 1 tablet by mouth daily.      Marland Kitchen DIPHENHYDRAMINE HCL 25 MG PO CAPS Oral Take 25-50 mg by mouth every 6 (six) hours as needed. Allergies    . FERROUS SULFATE 325 (65 FE) MG PO TABS Oral Take 325 mg by mouth daily with breakfast.    .  HYDROCHLOROTHIAZIDE 25 MG PO TABS Oral Take 25 mg by mouth daily.      Marland Kitchen HYDROCODONE-ACETAMINOPHEN 5-500 MG PO TABS Oral Take 1 tablet by mouth every 6 (six) hours as needed.    Marland Kitchen LANSOPRAZOLE 30 MG PO CPDR Oral Take 30 mg by mouth 2 (two) times daily as needed.     . METHOCARBAMOL 500 MG PO TABS Oral Take 500 mg by mouth 4 (four) times daily.    Marland Kitchen METOPROLOL SUCCINATE ER 50 MG PO TB24 Oral Take 25 mg by mouth 2 (two) times daily.     Marland Kitchen GLUCOSAMINE CHONDROITIN VIT D3 PO Oral Take 2 tablets by mouth daily.      . OMEGA-3-ACID ETHYL ESTERS 1 G PO CAPS Oral Take 1 g by mouth 3 (three) times daily.     Marland Kitchen ZOLPIDEM TARTRATE 5 MG PO TABS Oral Take 5 mg by mouth at bedtime as needed.      There were no vitals taken for this visit.  Physical Exam  Nursing note and vitals reviewed. Constitutional: She appears well-developed and well-nourished. No distress.  HENT:  Head: Normocephalic and atraumatic.  Eyes: Conjunctivae are normal. Right eye exhibits no discharge. Left eye exhibits no discharge.  Neck: Neck supple.    Cardiovascular: Normal rate, regular rhythm and normal heart sounds.  Exam reveals no gallop and no friction rub.   No murmur heard. Pulmonary/Chest: Effort normal and breath sounds normal. No respiratory distress. She has no wheezes. She exhibits tenderness.       Pain is reproducible. No overlying skin lesions noted. No crepitus.  Abdominal: Soft. She exhibits no distension. There is no tenderness.  Musculoskeletal: She exhibits no edema and no tenderness.       R anterior knee incision healing well and appropriate in appearance for given hx of surgery 1 month ago. Distal swelling. Calf tenderness. + homans. Neurovascularly intact distally.  Neurological: She is alert.  Skin: Skin is warm and dry.  Psychiatric: She has a normal mood and affect. Her behavior is normal. Thought content normal.    ED Course  Procedures (including critical care time)  Labs Reviewed  CBC - Abnormal; Notable for the following:    RBC 3.79 (*)    Hemoglobin 11.4 (*)    HCT 34.8 (*)    All other components within normal limits  POCT I-STAT TROPONIN I  COMPREHENSIVE METABOLIC PANEL  I-STAT TROPONIN I   Dg Chest 2 View  10/04/2011  *RADIOLOGY REPORT*  Clinical Data: Chest pain, shortness of breath, hypertension  CHEST - 2 VIEW  Comparison: 08/23/2011  Findings: Normal heart size and vascularity.  Negative for CHF, pneumonia, collapse, consolidation, effusion or pneumothorax. Chronic apical scarring.  Trachea is midline.  Degenerative changes of the spine.  IMPRESSION: Stable chest exam.  No acute chest finding.  Original Report Authenticated By: Judie Petit. Ruel Favors, M.D.   Nm Pulmonary Per & Vent  10/05/2011  *RADIOLOGY REPORT*  Clinical Data:  Chest pain, shortness of breath  NUCLEAR MEDICINE VENTILATION - PERFUSION LUNG SCAN  Technique:  Wash-in, equilibrium, and wash-out phase ventilation images were obtained using Xe-133 gas.  Perfusion images were obtained in multiple projections after intravenous injection  of Tc- 55m MAA.  Radiopharmaceuticals:  11 mCi Xe-133 gas and 5 mCi Tc-55m MAA.  Comparison:  10/04/2011 chest x-ray  Findings: Symmetric normal lung perfusion.  No wedge shaped segmental perfusion defects identified in either lung.  Normal symmetric ventilation.  No mismatched defects.  Normal washout.  IMPRESSION: Normal ventilation perfusion lung scan.  Original Report Authenticated By: Judie Petit. Ruel Favors, M.D.   EKG:  Rhythm: normal sinus Rate: 69 Axis: normal Intervals: normal ST segments: Mild non diagnostic ST elevation in v2, this is unchanged from previous. t wave inversions in lead III which noted on previous from 2010 as well.   1. Leg pain, right   2. Chest pain       MDM  60yF with CP and R calf pain and swelling s/p total knee 1 month ago. PE high on differential. Also consider ACS, pneumonia, musculoskeletal, dissection. Symptoms atypical for ACS given constant nature and reproducibility. EKG with no acute changes concerning for ischemia. Troponin 0.00. VQ scan given allergy history and normal study. Pt needs doppler. Discussed lovenox and DC with doppler in am versus holding in TCU and getting study in am. Pt would prefer to go home and return for duplex in am. Dose of lovenox and will arrange for study in morning.        Raeford Razor, MD 10/05/11 [phone]

## 2011-10-05 NOTE — Progress Notes (Signed)
*  PRELIMINARY RESULTS*  Right Lower Extremity Venous Duplex has been performed. Right:  No evidence of DVT, superficial thrombosis, or Baker's cyst.   Farrel Demark RDMS 10/05/2011, 11:59 AM

## 2011-10-05 NOTE — ED Notes (Signed)
Patient stable upon discharge.  

## 2012-03-22 ENCOUNTER — Other Ambulatory Visit: Payer: Self-pay | Admitting: Internal Medicine

## 2012-03-22 DIAGNOSIS — Z1231 Encounter for screening mammogram for malignant neoplasm of breast: Secondary | ICD-10-CM

## 2012-03-24 ENCOUNTER — Ambulatory Visit
Admission: RE | Admit: 2012-03-24 | Discharge: 2012-03-24 | Disposition: A | Discharge status: Home / Self Care | Payer: Medicare Other | Source: Ambulatory Visit | Attending: Internal Medicine | Admitting: Internal Medicine

## 2012-03-24 ENCOUNTER — Other Ambulatory Visit: Payer: Self-pay | Admitting: Internal Medicine

## 2012-03-24 DIAGNOSIS — Z1231 Encounter for screening mammogram for malignant neoplasm of breast: Secondary | ICD-10-CM

## 2012-03-24 DIAGNOSIS — C50919 Malignant neoplasm of unspecified site of unspecified female breast: Secondary | ICD-10-CM

## 2012-06-03 ENCOUNTER — Emergency Department (HOSPITAL_COMMUNITY): Payer: Medicare Other

## 2012-06-03 ENCOUNTER — Encounter (HOSPITAL_COMMUNITY): Payer: Self-pay | Admitting: Emergency Medicine

## 2012-06-03 ENCOUNTER — Emergency Department (HOSPITAL_COMMUNITY)
Admission: EM | Admit: 2012-06-03 | Discharge: 2012-06-04 | Disposition: A | Discharge status: Home / Self Care | Payer: Medicare Other | Attending: Emergency Medicine | Admitting: Emergency Medicine

## 2012-06-03 DIAGNOSIS — M549 Dorsalgia, unspecified: Secondary | ICD-10-CM | POA: Insufficient documentation

## 2012-06-03 DIAGNOSIS — S161XXA Strain of muscle, fascia and tendon at neck level, initial encounter: Secondary | ICD-10-CM

## 2012-06-03 DIAGNOSIS — Z853 Personal history of malignant neoplasm of breast: Secondary | ICD-10-CM | POA: Insufficient documentation

## 2012-06-03 DIAGNOSIS — S7001XA Contusion of right hip, initial encounter: Secondary | ICD-10-CM

## 2012-06-03 DIAGNOSIS — S060X9A Concussion with loss of consciousness of unspecified duration, initial encounter: Secondary | ICD-10-CM

## 2012-06-03 DIAGNOSIS — W010XXA Fall on same level from slipping, tripping and stumbling without subsequent striking against object, initial encounter: Secondary | ICD-10-CM | POA: Insufficient documentation

## 2012-06-03 DIAGNOSIS — S7000XA Contusion of unspecified hip, initial encounter: Secondary | ICD-10-CM | POA: Insufficient documentation

## 2012-06-03 DIAGNOSIS — I10 Essential (primary) hypertension: Secondary | ICD-10-CM | POA: Insufficient documentation

## 2012-06-03 DIAGNOSIS — R0789 Other chest pain: Secondary | ICD-10-CM | POA: Insufficient documentation

## 2012-06-03 DIAGNOSIS — S139XXA Sprain of joints and ligaments of unspecified parts of neck, initial encounter: Secondary | ICD-10-CM | POA: Insufficient documentation

## 2012-06-03 DIAGNOSIS — S060XAA Concussion with loss of consciousness status unknown, initial encounter: Secondary | ICD-10-CM

## 2012-06-03 DIAGNOSIS — W19XXXA Unspecified fall, initial encounter: Secondary | ICD-10-CM

## 2012-06-03 LAB — URINE MICROSCOPIC-ADD ON

## 2012-06-03 LAB — CBC
HCT: 37.1 % (ref 36.0–46.0)
Hemoglobin: 12.3 g/dL (ref 12.0–15.0)
MCH: 30.1 pg (ref 26.0–34.0)
MCHC: 33.2 g/dL (ref 30.0–36.0)
MCV: 90.7 fL (ref 78.0–100.0)
Platelets: 218 10*3/uL (ref 150–400)
RBC: 4.09 MIL/uL (ref 3.87–5.11)
RDW: 12.9 % (ref 11.5–15.5)
WBC: 4.3 10*3/uL (ref 4.0–10.5)

## 2012-06-03 LAB — COMPREHENSIVE METABOLIC PANEL WITH GFR
ALT: 14 U/L (ref 0–35)
AST: 21 U/L (ref 0–37)
Albumin: 3.3 g/dL — ABNORMAL LOW (ref 3.5–5.2)
Alkaline Phosphatase: 87 U/L (ref 39–117)
BUN: 16 mg/dL (ref 6–23)
CO2: 30 meq/L (ref 19–32)
Calcium: 9.8 mg/dL (ref 8.4–10.5)
Chloride: 102 meq/L (ref 96–112)
Creatinine, Ser: 0.83 mg/dL (ref 0.50–1.10)
GFR calc Af Amer: 85 mL/min — ABNORMAL LOW
GFR calc non Af Amer: 73 mL/min — ABNORMAL LOW
Glucose, Bld: 98 mg/dL (ref 70–99)
Potassium: 3.7 meq/L (ref 3.5–5.1)
Sodium: 138 meq/L (ref 135–145)
Total Bilirubin: 0.2 mg/dL — ABNORMAL LOW (ref 0.3–1.2)
Total Protein: 6.9 g/dL (ref 6.0–8.3)

## 2012-06-03 LAB — TROPONIN I: Troponin I: <0.3 ng/mL (ref <0.30)

## 2012-06-03 LAB — URINALYSIS, ROUTINE W REFLEX MICROSCOPIC
Bilirubin Urine: NEGATIVE
Glucose, UA: NEGATIVE mg/dL
Hgb urine dipstick: NEGATIVE
Ketones, ur: NEGATIVE mg/dL
Nitrite: NEGATIVE
Protein, ur: NEGATIVE mg/dL
Specific Gravity, Urine: 1.026 (ref 1.005–1.030)
Urobilinogen, UA: 1 mg/dL (ref 0.0–1.0)
pH: 7 (ref 5.0–8.0)

## 2012-06-03 LAB — CK: Total CK: 181 U/L — ABNORMAL HIGH (ref 7–177)

## 2012-06-03 MED ORDER — OXYCODONE-ACETAMINOPHEN 5-325 MG PO TABS
1.0000 | ORAL_TABLET | Freq: Once | ORAL | Status: AC
  Administered 2012-06-03: 1 via ORAL
  Filled 2012-06-03: qty 1

## 2012-06-03 NOTE — ED Notes (Signed)
Pt sts she slipped out of her tub earlier today, at approximately 0400. sts she remembers slipping but does not remember falling. Hit the back of her head, no knot noted. Patient sts she hit her leg and the side of her body as well. C/o generalized pain.

## 2012-06-03 NOTE — ED Provider Notes (Signed)
History     CSN: [id number]  Arrival date & time 06/03/12  2011   First MD Initiated Contact with Patient 06/03/12 2124      Chief Complaint  Patient presents with  . Loss of Consciousness    (Consider location/radiation/quality/duration/timing/severity/associated sxs/prior treatment) HPI History provided by pt.   Pt slipped in shower this morning, fell backwards out of the tub, and landed on her back.  Did not have dizziness, lightheadedness or weakness prior to fall.  Had LOC.  Woke with pain in her posterior head that is currently resolved.  C/o nausea w/ 3 episodes of vomiting (no dizziness or vision changes) as well as diffuse body aches, particularly in her lower back, R hip and bilateral LE; onset after fall.  She is ambulatory and does not believe she has broken any bones.  Also c/o constant squeezing sensation in the center of her chest since fall.  No associated SOB, cough or fever.   Past Medical History  Diagnosis Date  . Hypertension   . Heart murmur     functional murmur, echo in [place]- long time ago   . Lupus   . Blood transfusion     1986- post-partial hysterectomy  . Hepatitis     Hep. A- 1978  . Chronic kidney disease   . GERD (gastroesophageal reflux disease)     recently taken off anti reflux tx  . Arthritis     R knee, hands   . No pertinent past medical history     will see cardiac for review this week, 08/24/2011  . rt breast ca dx'd 12/2007    xrt comp;    Past Surgical History  Procedure Date  . Joint replacement     L knee replacement- 2006, arthroscopic surg. both knees   . Mastectomy     R breast- lumpectomy -2009  . Tonsillectomy     1954  . Appendectomy     1978 & tubal ligation   . Abdominal hysterectomy     1986  . Tubal ligation   . Breast surgery     R breast lumpectomy  . Cholecystectomy     1978- open   . Total knee arthroplasty 08/30/2011    Procedure: TOTAL KNEE ARTHROPLASTY;  Surgeon: Nestor Lewandowsky;  Location: [facility] OR;  Service:  Orthopedics;  Laterality: Right;  Right Total Knee Arthroplasty    Family History  Problem Relation Age of Onset  . Anesthesia problems Neg Hx   . Hypotension Neg Hx   . Malignant hyperthermia Neg Hx   . Pseudochol deficiency Neg Hx     History  Substance Use Topics  . Smoking status: Never Smoker   . Smokeless tobacco: Not on file  . Alcohol Use: No    OB History    Grav Para Term Preterm Abortions TAB SAB Ect Mult Living                  Review of Systems  All other systems reviewed and are negative.    Allergies  Codeine; Iodine; Iohexol; Latex; Milk-related compounds; Penicillins; and Tetracycline  Home Medications   Current Outpatient Rx  Name Route Sig Dispense Refill  . VITAMIN C 1000 MG PO TABS Oral Take 1,000 mg by mouth 2 (two) times daily.      Marland Kitchen VITAMIN D3 2000 UNITS PO TABS Oral Take 1 tablet by mouth daily.      Marland Kitchen DIPHENHYDRAMINE HCL 25 MG PO CAPS Oral Take 25-50 mg  by mouth every 6 (six) hours as needed. Allergies    . FERROUS SULFATE 325 (65 FE) MG PO TABS Oral Take 325 mg by mouth daily with breakfast.    . HYDROCHLOROTHIAZIDE 25 MG PO TABS Oral Take 25 mg by mouth daily.      Marland Kitchen HYDROCODONE-ACETAMINOPHEN 5-500 MG PO TABS Oral Take 1 tablet by mouth every 6 (six) hours as needed.    Marland Kitchen LANSOPRAZOLE 30 MG PO CPDR Oral Take 30 mg by mouth 2 (two) times daily as needed.     . METHOCARBAMOL 500 MG PO TABS Oral Take 500 mg by mouth 4 (four) times daily.    Marland Kitchen METOPROLOL SUCCINATE ER 50 MG PO TB24 Oral Take 25 mg by mouth 2 (two) times daily.     Marland Kitchen GLUCOSAMINE CHONDROITIN VIT D3 PO Oral Take 2 tablets by mouth daily.      . OMEGA-3-ACID ETHYL ESTERS 1 G PO CAPS Oral Take 1 g by mouth 3 (three) times daily.     Marland Kitchen ZOLPIDEM TARTRATE 5 MG PO TABS Oral Take 5 mg by mouth at bedtime as needed.      BP 157/86  Pulse 65  Temp 98.2 F (36.8 C) (Oral)  Resp 17  SpO2 99%  Physical Exam  Nursing note and vitals reviewed. Constitutional: She is oriented to  person, place, and time. She appears well-developed and well-nourished. No distress.  HENT:  Head: Normocephalic and atraumatic.  Eyes:       Normal appearance  Neck: Normal range of motion.  Cardiovascular: Normal rate, regular rhythm and intact distal pulses.   Pulmonary/Chest: Effort normal and breath sounds normal.       Tenderness over sternum.  Pt reports that pain is aggravated w/ deep inspiration.   Musculoskeletal: Normal range of motion.       Tenderness mid-line lower cervical and upper thoracic spine w/ guarding.  Tenderness lateral and anterior right hip and just proximal to right patella.  Minimal pain w/ passive ROM all joints LEs.    Neurological: She is alert and oriented to person, place, and time. No sensory deficit. Coordination normal.       CN 3-12 intact.  No nystagmus. 5/5 and equal upper and lower extremity strength.  No past pointing.     Skin: Skin is warm and dry. No rash noted.  Psychiatric: She has a normal mood and affect. Her behavior is normal.    ED Course  Procedures (including critical care time)  Date: 06/04/2012  Rate: 55  Rhythm: normal sinus rhythm  QRS Axis: normal  Intervals: normal  ST/T Wave abnormalities: normal  Conduction Disutrbances:none  Narrative Interpretation:   Old EKG Reviewed: unchanged    Labs Reviewed  COMPREHENSIVE METABOLIC PANEL - Abnormal; Notable for the following:    Albumin 3.3 (*)     Total Bilirubin 0.2 (*)     GFR calc non Af Amer 73 (*)     GFR calc Af Amer 85 (*)     All other components within normal limits  CK - Abnormal; Notable for the following:    Total CK 181 (*)     All other components within normal limits  URINALYSIS, ROUTINE W REFLEX MICROSCOPIC - Abnormal; Notable for the following:    APPearance CLOUDY (*)     Leukocytes, UA MODERATE (*)     All other components within normal limits  URINE MICROSCOPIC-ADD ON - Abnormal; Notable for the following:    Squamous Epithelial / LPF  FEW (*)      Bacteria, UA MANY (*)     Crystals CA OXALATE CRYSTALS (*)     All other components within normal limits  TROPONIN I  CBC  POCT CBG (FASTING - GLUCOSE)-MANUAL ENTRY   Dg Chest 2 View  06/03/2012  *RADIOLOGY REPORT*  Clinical Data: Fall, chest pain.  CHEST - 2 VIEW  Comparison: 10/04/2011  Findings: Cardiomediastinal contours unchanged.  Mild aortic arch atherosclerosis.  Lungs remain predominately clear.  Mild mid thoracic degenerative changes.  No acute osseous finding.  IMPRESSION: No radiographic evidence of acute cardiopulmonary process.   Original Report Authenticated By: Waneta Martins, M.D.    Dg Cervical Spine Complete  06/03/2012  *RADIOLOGY REPORT*  Clinical Data: Fall, neck discomfort.  CERVICAL SPINE - COMPLETE 4+ VIEW  Comparison: 12/04/2007 CT  Findings: Degenerative disc disease, most pronounced at C4-5 and C5- 6. Minimal anterolisthesis of C3 and C4 and minimal retrolisthesis of C5 on C6.  Otherwise, maintained vertebral body height and alignment. No acute fracture or dislocation is identified.  Absent dentition.  Clear apices.  Maintained C1-2 articulation.  No dens fracture. Paravertebral soft tissues within normal limits.  IMPRESSION: Degenerative changes of the mid cervical spine as above.  No acute osseous abnormality identified.   Original Report Authenticated By: Waneta Martins, M.D.    Dg Thoracic Spine 2 View  06/03/2012  *RADIOLOGY REPORT*  Clinical Data: Fall with mid back pain.  THORACIC SPINE - 2 VIEW  Comparison: 10/04/2011 and prior chest radiographs  Findings: A mild thoracic scoliosis is identified. There is no evidence of fracture or subluxation. Mild multilevel degenerative disc disease noted. No focal bony lesions are present.  IMPRESSION: No evidence of acute abnormality.  Unchanged mild scoliosis.   Original Report Authenticated By: Rosendo Gros, M.D.    Dg Hip Complete Right  06/03/2012  *RADIOLOGY REPORT*  Clinical Data: Fall with right hip pain.   RIGHT HIP - COMPLETE 2+ VIEW  Comparison: None  Findings: There is no evidence of acute fracture, subluxation or dislocation. There is joint space is unremarkable. No focal bony lesions are present. Mild degenerative changes of the lower lumbar spine noted.  IMPRESSION: No evidence of acute abnormality.   Original Report Authenticated By: Rosendo Gros, M.D.    Ct Head Wo Contrast  06/04/2012  *RADIOLOGY REPORT*  Clinical Data: 61 year old female with loss of consciousness. History of breast cancer.  CT HEAD WITHOUT CONTRAST  Technique:  Contiguous axial images were obtained from the base of the skull through the vertex without contrast.  Comparison: 12/04/2007 CT  Findings: No intracranial abnormalities are identified, including mass lesion or mass effect, hydrocephalus, extra-axial fluid collection, midline shift, hemorrhage, or acute infarction.  The visualized bony calvarium is unremarkable.  IMPRESSION: Unremarkable noncontrast head CT.   Original Report Authenticated By: Rosendo Gros, M.D.      1. Fall   2. Concussion   3. Contusion of right hip   4. Musculoskeletal chest pain   5. Cervical strain       MDM  61yo F presents w/ CP, N/V, R hip/back pain following what sounds to be a mechanical fall this am.  RF for ACS include FH early MI and HTN.  Pain reproducible on exam and EKG             .  Suspect musculoskeletal pain secondary to fall.   Troponin and CXR pending.  Exam also sig for cervical/thoracic spine and R hip  ttp and nml neuro.  Xrays as well as CT head ( LOC and 3 episodes of vomiting; suspect concussion but will r/o intracranial hemorrhage) have been ordered.   Pt has received percocet for pain.  Will reassess shortly.  10:06 PM   EKG non-ischemic and unchanged from prior.  Labs sig for UTI and neg troponin.  Xrays spine, chest and R hip neg for acute pathology.  CT head refused by patient initially because she thought she had already had an xray of her head.  She is now  willing to have it and it is pending.  Continues to deny headache, vision changes and dizziness and has not had any vomiting in ED. Reports minimal relief of pain w/ percocet.  1mg  IM dilaudid ordered.  12:47 AM   CT head neg.  Pain improved.  Pt d/c'd home w/ oxycodone, zofran and concussion/return precautions.  6:24 AM             Otilio Miu, PA 06/04/12 [phone]

## 2012-06-03 NOTE — ED Notes (Signed)
Pt states she was in bath tub attempting to relief cramps this am @ 0400, pt states she remembers slipping then she awoke outside the tub on floor with headache, she called for husband who assisted her from the floor. Pt states she has not felt well all day nausea with "pulling" in chest, emesis x 2 today with headache

## 2012-06-04 ENCOUNTER — Emergency Department (HOSPITAL_COMMUNITY): Payer: Medicare Other

## 2012-06-04 MED ORDER — ONDANSETRON HCL 8 MG PO TABS: 8.0000 mg | ORAL_TABLET | Freq: Three times a day (TID) | ORAL | Status: AC | PRN

## 2012-06-04 MED ORDER — HYDROMORPHONE HCL PF 1 MG/ML IJ SOLN
1.0000 mg | Freq: Once | INTRAMUSCULAR | Status: AC
  Administered 2012-06-04: 1 mg via INTRAMUSCULAR
  Filled 2012-06-04: qty 1

## 2012-06-04 MED ORDER — OXYCODONE-ACETAMINOPHEN 5-325 MG PO TABS: 1.0000 | ORAL_TABLET | ORAL | Status: AC | PRN

## 2012-06-04 NOTE — ED Notes (Signed)
Patient given discharge instructions, information, prescriptions, and diet order. Patient states that they adequately understand discharge information given and to return to ED if symptoms return or worsen.     

## 2012-06-05 NOTE — ED Provider Notes (Signed)
Medical screening examination/treatment/procedure(s) were conducted as a shared visit with non-physician practitioner(s) and myself.  I personally evaluated the patient during the encounter  Camielle Sizer T Allesha Aronoff, MD 06/05/12 0928 

## 2012-06-26 ENCOUNTER — Ambulatory Visit (HOSPITAL_BASED_OUTPATIENT_CLINIC_OR_DEPARTMENT_OTHER): Payer: Medicare Other | Admitting: Lab

## 2012-06-26 ENCOUNTER — Other Ambulatory Visit: Payer: Medicare Other | Admitting: Lab

## 2012-06-26 ENCOUNTER — Telehealth: Payer: Self-pay | Admitting: Oncology

## 2012-06-26 ENCOUNTER — Encounter: Payer: Self-pay | Admitting: Oncology

## 2012-06-26 ENCOUNTER — Telehealth: Payer: Self-pay | Admitting: Emergency Medicine

## 2012-06-26 ENCOUNTER — Ambulatory Visit (HOSPITAL_BASED_OUTPATIENT_CLINIC_OR_DEPARTMENT_OTHER): Payer: Medicare Other | Admitting: Oncology

## 2012-06-26 VITALS — BP 132/86 | HR 67 | Temp 98.7°F | Resp 20 | Ht 67.0 in | Wt 190.7 lb

## 2012-06-26 DIAGNOSIS — C50419 Malignant neoplasm of upper-outer quadrant of unspecified female breast: Secondary | ICD-10-CM

## 2012-06-26 DIAGNOSIS — Z170 Estrogen receptor positive status [ER+]: Secondary | ICD-10-CM

## 2012-06-26 DIAGNOSIS — C50919 Malignant neoplasm of unspecified site of unspecified female breast: Secondary | ICD-10-CM

## 2012-06-26 LAB — CBC WITH DIFFERENTIAL/PLATELET
BASO%: 0.4 % (ref 0.0–2.0)
Basophils Absolute: 0 10*3/uL (ref 0.0–0.1)
EOS%: 0.5 % (ref 0.0–7.0)
Eosinophils Absolute: 0 10*3/uL (ref 0.0–0.5)
HCT: 41.2 % (ref 34.8–46.6)
HGB: 13.9 g/dL (ref 11.6–15.9)
LYMPH%: 45.1 % (ref 14.0–49.7)
MCH: 31.3 pg (ref 25.1–34.0)
MCHC: 33.7 g/dL (ref 31.5–36.0)
MCV: 92.9 fL (ref 79.5–101.0)
MONO#: 0.7 10*3/uL (ref 0.1–0.9)
MONO%: 15 % — ABNORMAL HIGH (ref 0.0–14.0)
NEUT#: 1.7 10*3/uL (ref 1.5–6.5)
NEUT%: 39 % (ref 38.4–76.8)
Platelets: 200 10*3/uL (ref 145–400)
RBC: 4.43 10*6/uL (ref 3.70–5.45)
RDW: 13.4 % (ref 11.2–14.5)
WBC: 4.5 10*3/uL (ref 3.9–10.3)
lymph#: 2 10*3/uL (ref 0.9–3.3)

## 2012-06-26 LAB — COMPREHENSIVE METABOLIC PANEL (CC13)
ALT: 29 U/L (ref 0–55)
AST: 28 U/L (ref 5–34)
Albumin: 3.8 g/dL (ref 3.5–5.0)
Alkaline Phosphatase: 81 U/L (ref 40–150)
BUN: 13 mg/dL (ref 7.0–26.0)
CO2: 25 mEq/L (ref 22–29)
Calcium: 10.2 mg/dL (ref 8.4–10.4)
Chloride: 103 mEq/L (ref 98–107)
Creatinine: 0.9 mg/dL (ref 0.6–1.1)
Glucose: 89 mg/dl (ref 70–99)
Potassium: 4 mEq/L (ref 3.5–5.1)
Sodium: 137 mEq/L (ref 136–145)
Total Bilirubin: 0.7 mg/dL (ref 0.20–1.20)
Total Protein: 7.7 g/dL (ref 6.4–8.3)

## 2012-06-26 NOTE — Progress Notes (Signed)
Quick Note:  Please call patient: white count normal ______

## 2012-06-26 NOTE — Progress Notes (Signed)
Quick Note:  Please call patient: chemistries and liver function tests normal ______

## 2012-06-26 NOTE — Progress Notes (Signed)
OFFICE PROGRESS NOTE  CC  Jaclyn Aliment, MD [address] 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  PRIOR THERAPY: 1 patient is status post lumpectomy with sentinel lymph node biopsy followed by radiation to the right breast. In December 2009 patient began aromatase inhibitor but could not tolerate it.  #2 therefore she began in March 2010 tamoxifen 20 mg daily. However she has experienced significant amount of aches and pains and in December 2012 discontinued it. At this time she does not want to go back on any kind of antiestrogen therapy due to side effects.  CURRENT THERAPY: Observation  INTERVAL HISTORY: [name] 61 y.o. female returns for followup visit today. She has been seen by me since August 2012. She missed couple of appointments with [name]. She however did get the right knee surgery performed in December 2012. She is feeling much better. She tells me that she came off of the antiestrogen therapy specifically tamoxifen in December 2012. Since then she has felt much better her aches pains and hot flashes have improved significantly. However she still has occasional hot flashes. She is interested in the sleep study. She denies any nausea vomiting fevers chills. She does still have some insomnia intermittently. She also has had some intermittent constipation she does take a laxative for it. Recently she had a UTI was treated. She was told by her primary care physician.she had an elevated white count. We will check her counts today as well. She occasionally does have some dizziness and blurred vision from recent fall otherwise no other problems nothing permanent. Remainder of the 10 point review of systems is negative.  MEDICAL HISTORY: Past Medical History  Diagnosis Date  . Hypertension   . Heart murmur     functional murmur, echo in [place]- long time  ago   . Lupus   . Blood transfusion     1986- post-partial hysterectomy  . Hepatitis     Hep. A- 1978  . Chronic kidney disease   . GERD (gastroesophageal reflux disease)     recently taken off anti reflux tx  . Arthritis     R knee, hands   . No pertinent past medical history     will see cardiac for review this week, 08/24/2011  . rt breast ca dx'd 12/2007    xrt comp;    ALLERGIES:  is allergic to iodine; iohexol; latex; penicillins; tetracycline; and milk-related compounds.  MEDICATIONS:  Current Outpatient Prescriptions  Medication Sig Dispense Refill  . Cholecalciferol (VITAMIN D3) 2000 UNITS TABS Take 1 tablet by mouth every morning.       . hydrochlorothiazide (HYDRODIURIL) 25 MG tablet Take 25 mg by mouth every morning.       . metoprolol tartrate (LOPRESSOR) 25 MG tablet Take 25 mg by mouth 2 (two) times daily.      Marland Kitchen omega-3 acid ethyl esters (LOVAZA) 1 G capsule Take 1 g by mouth 3 (three) times daily.       Marland Kitchen zolpidem (AMBIEN) 5 MG tablet Take 5 mg by mouth at bedtime as needed.        SURGICAL HISTORY:  Past Surgical History  Procedure Date  . Joint replacement     L knee replacement- 2006, arthroscopic surg. both knees   . Mastectomy     R breast- lumpectomy -2009  . Tonsillectomy     1954  .  Appendectomy     1978 & tubal ligation   . Abdominal hysterectomy     1986  . Tubal ligation   . Breast surgery     R breast lumpectomy  . Cholecystectomy     1978- open   . Total knee arthroplasty 08/30/2011    Procedure: TOTAL KNEE ARTHROPLASTY;  Surgeon: Nestor Lewandowsky;  Location: [facility] OR;  Service: Orthopedics;  Laterality: Right;  Right Total Knee Arthroplasty    REVIEW OF SYSTEMS:  Pertinent items are noted in HPI.   HEALTH MAINTENANCE:   PHYSICAL EXAMINATION: Blood pressure 132/86, pulse 67, temperature 98.7 F (37.1 C), temperature source Oral, resp. rate 20, height 5\' 7"  (1.702 m), weight 190 lb 11.2 oz (86.501 kg). Body mass index is 29.87  kg/(m^2). ECOG PERFORMANCE STATUS: 1 - Symptomatic but completely ambulatory HEENT exam EOMI PERRLA sclerae anicteric no conjunctival pallor oral mucosa is moist neck is supple lungs are clear bilaterally to auscultation and percussion cardiovascular is regular rate rhythm no murmurs gallops or rubs abdomen is soft nontender nondistended bowel sounds are present no hepatosplenomegaly extremities no edema neuro patient's alert oriented otherwise nonfocal left breast no masses or nipple discharge right breast reveals well-healed incisional scar there is no nodularity no masses no tenderness no skin changes   LABORATORY DATA: Lab Results  Component Value Date   WBC 4.3 06/03/2012   HGB 12.3 06/03/2012   HCT 37.1 06/03/2012   MCV 90.7 06/03/2012   PLT 218 06/03/2012      Chemistry      Component Value Date/Time   NA 138 06/03/2012 2045   NA 136 03/13/2010 1158   K 3.7 06/03/2012 2045   K 3.9 03/13/2010 1158   CL 102 06/03/2012 2045   CL 101 03/13/2010 1158   CO2 30 06/03/2012 2045   CO2 30 03/13/2010 1158   BUN 16 06/03/2012 2045   BUN 14 03/13/2010 1158   CREATININE 0.83 06/03/2012 2045   CREATININE 0.8 03/13/2010 1158      Component Value Date/Time   CALCIUM 9.8 06/03/2012 2045   CALCIUM 9.7 03/13/2010 1158   ALKPHOS 87 06/03/2012 2045   ALKPHOS 65 03/13/2010 1158   AST 21 06/03/2012 2045   AST 31 03/13/2010 1158   ALT 14 06/03/2012 2045   BILITOT 0.2* 06/03/2012 2045   BILITOT 0.50 03/13/2010 1158       RADIOGRAPHIC STUDIES:  Dg Chest 2 View  06/03/2012  *RADIOLOGY REPORT*  Clinical Data: Fall, chest pain.  CHEST - 2 VIEW  Comparison: 10/04/2011  Findings: Cardiomediastinal contours unchanged.  Mild aortic arch atherosclerosis.  Lungs remain predominately clear.  Mild mid thoracic degenerative changes.  No acute osseous finding.  IMPRESSION: No radiographic evidence of acute cardiopulmonary process.   Original Report Authenticated By: Waneta Martins, M.D.    Dg Cervical Spine  Complete  06/03/2012  *RADIOLOGY REPORT*  Clinical Data: Fall, neck discomfort.  CERVICAL SPINE - COMPLETE 4+ VIEW  Comparison: 12/04/2007 CT  Findings: Degenerative disc disease, most pronounced at C4-5 and C5- 6. Minimal anterolisthesis of C3 and C4 and minimal retrolisthesis of C5 on C6.  Otherwise, maintained vertebral body height and alignment. No acute fracture or dislocation is identified.  Absent dentition.  Clear apices.  Maintained C1-2 articulation.  No dens fracture. Paravertebral soft tissues within normal limits.  IMPRESSION: Degenerative changes of the mid cervical spine as above.  No acute osseous abnormality identified.   Original Report Authenticated By: Waneta Martins, M.D.  Dg Thoracic Spine 2 View  06/03/2012  *RADIOLOGY REPORT*  Clinical Data: Fall with mid back pain.  THORACIC SPINE - 2 VIEW  Comparison: 10/04/2011 and prior chest radiographs  Findings: A mild thoracic scoliosis is identified. There is no evidence of fracture or subluxation. Mild multilevel degenerative disc disease noted. No focal bony lesions are present.  IMPRESSION: No evidence of acute abnormality.  Unchanged mild scoliosis.   Original Report Authenticated By: Rosendo Gros, M.D.    Dg Hip Complete Right  06/03/2012  *RADIOLOGY REPORT*  Clinical Data: Fall with right hip pain.  RIGHT HIP - COMPLETE 2+ VIEW  Comparison: None  Findings: There is no evidence of acute fracture, subluxation or dislocation. There is joint space is unremarkable. No focal bony lesions are present. Mild degenerative changes of the lower lumbar spine noted.  IMPRESSION: No evidence of acute abnormality.   Original Report Authenticated By: Rosendo Gros, M.D.    Ct Head Wo Contrast  06/04/2012  *RADIOLOGY REPORT*  Clinical Data: 61 year old female with loss of consciousness. History of breast cancer.  CT HEAD WITHOUT CONTRAST  Technique:  Contiguous axial images were obtained from the base of the skull through the vertex without  contrast.  Comparison: 12/04/2007 CT  Findings: No intracranial abnormalities are identified, including mass lesion or mass effect, hydrocephalus, extra-axial fluid collection, midline shift, hemorrhage, or acute infarction.  The visualized bony calvarium is unremarkable.  IMPRESSION: Unremarkable noncontrast head CT.   Original Report Authenticated By: Rosendo Gros, M.D.     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 She underwent lumpectomy followed by radiation and then began aromatase inhibitors but she could not tolerate and that was changed to tamoxifen 20 mg daily which she took for about 2 years and then discontinued due to side effects. At this time she does not want to go on any other medications and just wants to be followed expectantly.  #2 recent history of leukocytosis however her blood count from September 14 showed a normal white count of 4.3.   PLAN: Breath #1 patient will continue to be observed on an every 6 month basis. We will obtain a CBC Sinemet today as well as on her followup visit in 6 months  All questions were answered. The patient knows to call the clinic with any problems, questions or concerns. We can certainly see the patient much sooner if necessary.  I spent 25 minutes counseling the patient face to face. The total time spent in the appointment was 30 minutes.    Drue Second, MD Medical/Oncology [facility] [phone] (beeper) [phone] (Office)  06/26/2012, 9:43 AM

## 2012-06-26 NOTE — Patient Instructions (Addendum)
Doing well, no evidence of local recurrence  I will see you back in 6 months

## 2012-06-26 NOTE — Telephone Encounter (Signed)
gve the pt her may 2014 appt calendar 

## 2012-06-26 NOTE — Telephone Encounter (Signed)
Called patient and left a voicemail to inform her that her lab results from today are WNL.

## 2012-07-03 ENCOUNTER — Telehealth: Payer: Self-pay | Admitting: Oncology

## 2013-01-11 ENCOUNTER — Encounter (HOSPITAL_COMMUNITY): Payer: Self-pay | Admitting: *Deleted

## 2013-01-11 ENCOUNTER — Emergency Department (HOSPITAL_COMMUNITY)
Admission: EM | Admit: 2013-01-11 | Discharge: 2013-01-12 | Disposition: A | Discharge status: Home / Self Care | Payer: Medicare Other | Attending: Emergency Medicine | Admitting: Emergency Medicine

## 2013-01-11 DIAGNOSIS — N390 Urinary tract infection, site not specified: Secondary | ICD-10-CM | POA: Insufficient documentation

## 2013-01-11 DIAGNOSIS — Z90710 Acquired absence of both cervix and uterus: Secondary | ICD-10-CM | POA: Insufficient documentation

## 2013-01-11 DIAGNOSIS — M129 Arthropathy, unspecified: Secondary | ICD-10-CM | POA: Insufficient documentation

## 2013-01-11 DIAGNOSIS — I129 Hypertensive chronic kidney disease with stage 1 through stage 4 chronic kidney disease, or unspecified chronic kidney disease: Secondary | ICD-10-CM | POA: Insufficient documentation

## 2013-01-11 DIAGNOSIS — IMO0002 Reserved for concepts with insufficient information to code with codable children: Secondary | ICD-10-CM | POA: Insufficient documentation

## 2013-01-11 DIAGNOSIS — M545 Low back pain, unspecified: Secondary | ICD-10-CM | POA: Insufficient documentation

## 2013-01-11 DIAGNOSIS — Z91040 Latex allergy status: Secondary | ICD-10-CM | POA: Insufficient documentation

## 2013-01-11 DIAGNOSIS — Z9851 Tubal ligation status: Secondary | ICD-10-CM | POA: Insufficient documentation

## 2013-01-11 DIAGNOSIS — K219 Gastro-esophageal reflux disease without esophagitis: Secondary | ICD-10-CM | POA: Insufficient documentation

## 2013-01-11 DIAGNOSIS — R1013 Epigastric pain: Secondary | ICD-10-CM | POA: Insufficient documentation

## 2013-01-11 DIAGNOSIS — R011 Cardiac murmur, unspecified: Secondary | ICD-10-CM | POA: Insufficient documentation

## 2013-01-11 DIAGNOSIS — Z853 Personal history of malignant neoplasm of breast: Secondary | ICD-10-CM | POA: Insufficient documentation

## 2013-01-11 DIAGNOSIS — Z9089 Acquired absence of other organs: Secondary | ICD-10-CM | POA: Insufficient documentation

## 2013-01-11 DIAGNOSIS — R002 Palpitations: Secondary | ICD-10-CM | POA: Insufficient documentation

## 2013-01-11 DIAGNOSIS — M329 Systemic lupus erythematosus, unspecified: Secondary | ICD-10-CM | POA: Insufficient documentation

## 2013-01-11 DIAGNOSIS — Z880 Allergy status to penicillin: Secondary | ICD-10-CM | POA: Insufficient documentation

## 2013-01-11 DIAGNOSIS — Z8619 Personal history of other infectious and parasitic diseases: Secondary | ICD-10-CM | POA: Insufficient documentation

## 2013-01-11 DIAGNOSIS — N189 Chronic kidney disease, unspecified: Secondary | ICD-10-CM | POA: Insufficient documentation

## 2013-01-11 DIAGNOSIS — M79609 Pain in unspecified limb: Secondary | ICD-10-CM | POA: Insufficient documentation

## 2013-01-11 DIAGNOSIS — R509 Fever, unspecified: Secondary | ICD-10-CM | POA: Insufficient documentation

## 2013-01-11 DIAGNOSIS — Z79899 Other long term (current) drug therapy: Secondary | ICD-10-CM | POA: Insufficient documentation

## 2013-01-11 DIAGNOSIS — R42 Dizziness and giddiness: Secondary | ICD-10-CM | POA: Insufficient documentation

## 2013-01-11 DIAGNOSIS — R3915 Urgency of urination: Secondary | ICD-10-CM | POA: Insufficient documentation

## 2013-01-11 NOTE — ED Notes (Signed)
Pt states having a lupus flair up x 2 wks; given prednisone--got better; repeat flair up yesterday; headache; palpitations; leg cramps; facial burning

## 2013-01-12 LAB — POCT I-STAT, CHEM 8
BUN: 22 mg/dL (ref 6–23)
Calcium, Ion: 1.15 mmol/L (ref 1.13–1.30)
Chloride: 104 mEq/L (ref 96–112)
Creatinine, Ser: 0.8 mg/dL (ref 0.50–1.10)
Glucose, Bld: 94 mg/dL (ref 70–99)
HCT: 39 % (ref 36.0–46.0)
Hemoglobin: 13.3 g/dL (ref 12.0–15.0)
Potassium: 4.7 mEq/L (ref 3.5–5.1)
Sodium: 140 mEq/L (ref 135–145)
TCO2: 30 mmol/L (ref 0–100)

## 2013-01-12 LAB — POCT I-STAT TROPONIN I: Troponin i, poc: 0 ng/mL (ref 0.00–0.08)

## 2013-01-12 LAB — URINALYSIS, ROUTINE W REFLEX MICROSCOPIC
Bilirubin Urine: NEGATIVE
Glucose, UA: NEGATIVE mg/dL
Hgb urine dipstick: NEGATIVE
Ketones, ur: NEGATIVE mg/dL
Nitrite: NEGATIVE
Protein, ur: NEGATIVE mg/dL
Specific Gravity, Urine: 1.031 — ABNORMAL HIGH (ref 1.005–1.030)
Urobilinogen, UA: 1 mg/dL (ref 0.0–1.0)
pH: 6 (ref 5.0–8.0)

## 2013-01-12 LAB — URINE MICROSCOPIC-ADD ON

## 2013-01-12 MED ORDER — OXYCODONE-ACETAMINOPHEN 5-325 MG PO TABS: 1.0000 | ORAL_TABLET | ORAL | Status: DC | PRN

## 2013-01-12 MED ORDER — PREDNISONE 20 MG PO TABS
60.0000 mg | ORAL_TABLET | Freq: Once | ORAL | Status: AC
  Administered 2013-01-12: 60 mg via ORAL
  Filled 2013-01-12: qty 3

## 2013-01-12 MED ORDER — HYDROMORPHONE HCL PF 1 MG/ML IJ SOLN
1.0000 mg | Freq: Once | INTRAMUSCULAR | Status: AC
  Administered 2013-01-12: 1 mg via INTRAMUSCULAR
  Filled 2013-01-12: qty 1

## 2013-01-12 MED ORDER — OXYCODONE-ACETAMINOPHEN 5-325 MG PO TABS
1.0000 | ORAL_TABLET | Freq: Once | ORAL | Status: AC
  Administered 2013-01-12: 1 via ORAL
  Filled 2013-01-12: qty 1

## 2013-01-12 MED ORDER — FLUCONAZOLE 150 MG PO TABS: 150.0000 mg | ORAL_TABLET | Freq: Every day | ORAL | Status: AC

## 2013-01-12 MED ORDER — NITROFURANTOIN MONOHYD MACRO 100 MG PO CAPS: 100.0000 mg | ORAL_CAPSULE | Freq: Two times a day (BID) | ORAL | Status: DC

## 2013-01-12 MED ORDER — GI COCKTAIL ~~LOC~~
30.0000 mL | Freq: Once | ORAL | Status: AC
  Administered 2013-01-12: 30 mL via ORAL
  Filled 2013-01-12: qty 30

## 2013-01-12 NOTE — ED Provider Notes (Signed)
Medical screening examination/treatment/procedure(s) were performed by non-physician practitioner and as supervising physician I was immediately available for consultation/collaboration.  Cassell Voorhies, MD 01/12/13 0738 

## 2013-01-12 NOTE — ED Provider Notes (Signed)
History     CSN: [id number]  Arrival date & time 01/11/13  2137   First MD Initiated Contact with Patient 01/12/13 0059      Chief Complaint  Patient presents with  . Headache  . Palpitations    (Consider location/radiation/quality/duration/timing/severity/associated sxs/prior treatment) HPI History provided by pt.   Pt has h/o lupus with infrequent exacerbation.  Is not on chronic steroids.  Developed a typical exacerbation approximately 2 weeks ago.  Symptoms include frontal headache w/ associated lightheadedness, intermittent low grade fever, non-exertional SOB that varies in duration and resolves spontaneously, epigastric pain, urinary urgency, low back pain and lower extremity cramping.  Symptoms do not occur simultaneously.  Denies vision changes, dizziness, nasal congestion, sore throat, chest pain, cough, N/V/D and other GU sx.  Denies trauma.  Her oncologist as well as her PCP treated her w/ IM prednisone at onset of symptoms, but they have not improved.  She has not had relief of pain w/ vicodin.   Past Medical History  Diagnosis Date  . Hypertension   . Heart murmur     functional murmur, echo in [place]- long time ago   . Lupus   . Blood transfusion     1986- post-partial hysterectomy  . Hepatitis     Hep. A- 1978  . Chronic kidney disease   . GERD (gastroesophageal reflux disease)     recently taken off anti reflux tx  . Arthritis     R knee, hands   . No pertinent past medical history     will see cardiac for review this week, 08/24/2011  . rt breast ca dx'd 12/2007    xrt comp;    Past Surgical History  Procedure Laterality Date  . Joint replacement      L knee replacement- 2006, arthroscopic surg. both knees   . Mastectomy      R breast- lumpectomy -2009  . Tonsillectomy      1954  . Appendectomy      1978 & tubal ligation   . Abdominal hysterectomy      1986  . Tubal ligation    . Breast surgery      R breast lumpectomy  . Cholecystectomy      1978-  open   . Total knee arthroplasty  08/30/2011    Procedure: TOTAL KNEE ARTHROPLASTY;  Surgeon: Nestor Lewandowsky;  Location: [facility] OR;  Service: Orthopedics;  Laterality: Right;  Right Total Knee Arthroplasty    Family History  Problem Relation Age of Onset  . Anesthesia problems Neg Hx   . Hypotension Neg Hx   . Malignant hyperthermia Neg Hx   . Pseudochol deficiency Neg Hx     History  Substance Use Topics  . Smoking status: Never Smoker   . Smokeless tobacco: Not on file  . Alcohol Use: No    OB History   Grav Para Term Preterm Abortions TAB SAB Ect Mult Living                  Review of Systems  All other systems reviewed and are negative.    Allergies  Iodine; Iohexol; Latex; Penicillins; Tetracycline; and Milk-related compounds  Home Medications   Current Outpatient Rx  Name  Route  Sig  Dispense  Refill  . Cholecalciferol (VITAMIN D3) 2000 UNITS TABS   Oral   Take 1 tablet by mouth every morning.          . Ginkgo Biloba 100 MG CAPS  Oral   Take by mouth.         . hydrochlorothiazide (HYDRODIURIL) 25 MG tablet   Oral   Take 25 mg by mouth every morning.          Marland Kitchen HYDROcodone-acetaminophen (NORCO/VICODIN) 5-325 MG per tablet   Oral   Take 1 tablet by mouth every 6 (six) hours as needed for pain.         . metoprolol tartrate (LOPRESSOR) 25 MG tablet   Oral   Take 25 mg by mouth 2 (two) times daily.         Marland Kitchen zolpidem (AMBIEN) 5 MG tablet   Oral   Take 5 mg by mouth at bedtime as needed.           BP 139/88  Pulse 55  Temp(Src) 98.5 F (36.9 C)  Resp 17  SpO2 100%  Physical Exam  Nursing note and vitals reviewed. Constitutional: She is oriented to person, place, and time. She appears well-developed and well-nourished. No distress.  HENT:  Head: Normocephalic and atraumatic.  Eyes:  Normal appearance  Neck: Normal range of motion.  Cardiovascular: Normal rate, regular rhythm and intact distal pulses.   Pulmonary/Chest: Effort  normal and breath sounds normal.  Abdominal: Soft. Bowel sounds are normal. She exhibits no distension and no mass. There is no rebound and no guarding.  Diffuse upper abdominal ttp  Genitourinary:  R CVA ttp  Musculoskeletal: Normal range of motion.  Neurological: She is alert and oriented to person, place, and time. No sensory deficit. Coordination normal.  CN 3-12 intact.  No nystagmus.  5/5 and equal upper and lower extremity strength.  No past pointing.    Skin: Skin is warm and dry. No rash noted.  Psychiatric: She has a normal mood and affect. Her behavior is normal.    ED Course  Procedures (including critical care time)   Date: 01/12/2013  Rate: 56  Rhythm: sinus bradycardia  QRS Axis: normal  Intervals: normal  ST/T Wave abnormalities: normal  Conduction Disutrbances:none  Narrative Interpretation:   Old EKG Reviewed: unchanged   Labs Reviewed  URINALYSIS, ROUTINE W REFLEX MICROSCOPIC - Abnormal; Notable for the following:    APPearance CLOUDY (*)    Specific Gravity, Urine 1.031 (*)    Leukocytes, UA MODERATE (*)    All other components within normal limits  URINE MICROSCOPIC-ADD ON - Abnormal; Notable for the following:    Squamous Epithelial / LPF FEW (*)    Bacteria, UA FEW (*)    Crystals CA OXALATE CRYSTALS (*)    All other components within normal limits  URINE CULTURE  POCT I-STAT, CHEM 8  POCT I-STAT TROPONIN I   No results found.   1. UTI (lower urinary tract infection)   2. Exacerbation of systemic lupus       MDM  61yo F presents w/ c/o lupus exacerbation x 2 weeks, refractory to IM steroids.  She is requesting pain control.  Has various sx including intermittent fever, headache, SOB, myalgias and urinary urgency, all of which are typical.   No fever, headache or SOB currently.  Doubt ACS; no CP, SOB non-exertional, EKG non-ischemic and troponin neg.  Pt refused CXR d/t concern about radiation.  Denies head trauma.  Non-toxic appearing, no  focal neuro deficits and no meningeal signs on exam.  Diffuse upper abd tenderness but abd otherwise benign. U/A positive for infection; this caused her most recent exacerbation last year.  Pain in legs and low  back resolved w/ dilaudid.  Pt d/c'd home w/ macrobid, percocet and diflucan (prone to vaginal candidiasis).  She will f/u with her rheumatologist.  Return precautions discussed.        Otilio Miu, PA-C 01/12/13 [phone]

## 2013-01-13 LAB — URINE CULTURE: Colony Count: 90000

## 2013-01-25 ENCOUNTER — Ambulatory Visit (HOSPITAL_BASED_OUTPATIENT_CLINIC_OR_DEPARTMENT_OTHER): Payer: Medicare Other | Admitting: Oncology

## 2013-01-25 ENCOUNTER — Telehealth: Payer: Self-pay | Admitting: Oncology

## 2013-01-25 ENCOUNTER — Encounter: Payer: Self-pay | Admitting: Oncology

## 2013-01-25 ENCOUNTER — Other Ambulatory Visit (HOSPITAL_BASED_OUTPATIENT_CLINIC_OR_DEPARTMENT_OTHER): Payer: Medicare Other | Admitting: Lab

## 2013-01-25 VITALS — BP 109/67 | HR 69 | Temp 98.2°F | Resp 20 | Ht 67.0 in | Wt 198.3 lb

## 2013-01-25 DIAGNOSIS — G4700 Insomnia, unspecified: Secondary | ICD-10-CM

## 2013-01-25 DIAGNOSIS — C50419 Malignant neoplasm of upper-outer quadrant of unspecified female breast: Secondary | ICD-10-CM

## 2013-01-25 DIAGNOSIS — C50919 Malignant neoplasm of unspecified site of unspecified female breast: Secondary | ICD-10-CM

## 2013-01-25 DIAGNOSIS — K5900 Constipation, unspecified: Secondary | ICD-10-CM

## 2013-01-25 LAB — CBC WITH DIFFERENTIAL/PLATELET
BASO%: 0.3 % (ref 0.0–2.0)
Basophils Absolute: 0 10*3/uL (ref 0.0–0.1)
EOS%: 1.3 % (ref 0.0–7.0)
Eosinophils Absolute: 0.1 10*3/uL (ref 0.0–0.5)
HCT: 38.9 % (ref 34.8–46.6)
HGB: 13 g/dL (ref 11.6–15.9)
LYMPH%: 43.6 % (ref 14.0–49.7)
MCH: 31.3 pg (ref 25.1–34.0)
MCHC: 33.4 g/dL (ref 31.5–36.0)
MCV: 93.7 fL (ref 79.5–101.0)
MONO#: 0.6 10*3/uL (ref 0.1–0.9)
MONO%: 12.2 % (ref 0.0–14.0)
NEUT#: 2.1 10*3/uL (ref 1.5–6.5)
NEUT%: 42.6 % (ref 38.4–76.8)
Platelets: 172 10*3/uL (ref 145–400)
RBC: 4.15 10*6/uL (ref 3.70–5.45)
RDW: 13.5 % (ref 11.2–14.5)
WBC: 4.9 10*3/uL (ref 3.9–10.3)
lymph#: 2.1 10*3/uL (ref 0.9–3.3)

## 2013-01-25 LAB — COMPREHENSIVE METABOLIC PANEL (CC13)
ALT: 18 U/L (ref 0–55)
AST: 24 U/L (ref 5–34)
Albumin: 3.2 g/dL — ABNORMAL LOW (ref 3.5–5.0)
Alkaline Phosphatase: 68 U/L (ref 40–150)
BUN: 16.3 mg/dL (ref 7.0–26.0)
CO2: 29 mEq/L (ref 22–29)
Calcium: 9.8 mg/dL (ref 8.4–10.4)
Chloride: 102 mEq/L (ref 98–107)
Creatinine: 0.8 mg/dL (ref 0.6–1.1)
Glucose: 81 mg/dl (ref 70–99)
Potassium: 3.8 mEq/L (ref 3.5–5.1)
Sodium: 141 mEq/L (ref 136–145)
Total Bilirubin: 0.51 mg/dL (ref 0.20–1.20)
Total Protein: 7.2 g/dL (ref 6.4–8.3)

## 2013-01-25 NOTE — Patient Instructions (Addendum)
Doing well, no clinical evidence of recurrent cancer  I will see you back in 6 months

## 2013-01-25 NOTE — Progress Notes (Signed)
OFFICE PROGRESS NOTE  CC  [name], MD 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  PRIOR THERAPY: 1 patient is status post lumpectomy with sentinel lymph node biopsy followed by radiation to the right breast. In December 2009 patient began aromatase inhibitor but could not tolerate it.  #2 therefore she began in March 2010 tamoxifen 20 mg daily. However she has experienced significant amount of aches and pains and in December 2012 discontinued it. At this time she does not want to go back on any kind of antiestrogen therapy due to side effects.  CURRENT THERAPY: Observation  INTERVAL HISTORY: [name] 62 y.o. female returns for followup visit today. She has been seen by me since August 2012. She missed couple of appointments with [name]. She however did get the right knee surgery performed in December 2012. She is feeling much better. She tells me that she came off of the antiestrogen therapy specifically tamoxifen in December 2012. Since then she has felt much better her aches pains and hot flashes have improved significantly. However she still has occasional hot flashes. She is interested in the sleep study. She denies any nausea vomiting fevers chills. She does still have some insomnia intermittently. She also has had some intermittent constipation she does take a laxative for it. Recently she had a UTI was treated. She was told by her primary care physician.she had an elevated white count. We will check her counts today as well. She occasionally does have some dizziness and blurred vision from recent fall otherwise no other problems nothing permanent. Remainder of the 10 point review of systems is negative.  MEDICAL HISTORY: Past Medical History  Diagnosis Date  . Hypertension   . Heart murmur     functional murmur, echo in [place]- long time  ago   . Lupus   . Blood transfusion     1986- post-partial hysterectomy  . Hepatitis     Hep. A- 1978  . Chronic kidney disease   . GERD (gastroesophageal reflux disease)     recently taken off anti reflux tx  . Arthritis     R knee, hands   . No pertinent past medical history     will see cardiac for review this week, 08/24/2011  . rt breast ca dx'd 12/2007    xrt comp;    ALLERGIES:  is allergic to iodine; iohexol; latex; penicillins; tetracycline; and milk-related compounds.  MEDICATIONS:  Current Outpatient Prescriptions  Medication Sig Dispense Refill  . Cholecalciferol (VITAMIN D3) 2000 UNITS TABS Take 1 tablet by mouth every morning.       . Ginkgo Biloba 100 MG CAPS Take by mouth.      . hydrochlorothiazide (HYDRODIURIL) 25 MG tablet Take 25 mg by mouth every morning.       Marland Kitchen HYDROcodone-acetaminophen (NORCO/VICODIN) 5-325 MG per tablet Take 1 tablet by mouth every 6 (six) hours as needed for pain.      . metoprolol tartrate (LOPRESSOR) 25 MG tablet Take 25 mg by mouth 2 (two) times daily.      Marland Kitchen sulfamethoxazole-trimethoprim (BACTRIM DS) 800-160 MG per tablet       . zolpidem (AMBIEN) 5 MG tablet Take 5 mg by mouth at bedtime as needed.      . nitrofurantoin, macrocrystal-monohydrate, (MACROBID) 100 MG capsule Take 1 capsule (100 mg total) by mouth 2 (two) times daily.  14 capsule  0  . oxyCODONE-acetaminophen (PERCOCET/ROXICET) 5-325 MG per tablet Take 1 tablet by mouth every 4 (four) hours as needed for pain.  20 tablet  0   No current facility-administered medications for this visit.    SURGICAL HISTORY:  Past Surgical History  Procedure Laterality Date  . Joint replacement      L knee replacement- 2006, arthroscopic surg. both knees   . Mastectomy      R breast- lumpectomy -2009  . Tonsillectomy      1954  . Appendectomy      1978 & tubal ligation   . Abdominal hysterectomy      1986  . Tubal ligation    . Breast surgery      R breast lumpectomy  .  Cholecystectomy      1978- open   . Total knee arthroplasty  08/30/2011    Procedure: TOTAL KNEE ARTHROPLASTY;  Surgeon: Nestor Lewandowsky;  Location: [facility] OR;  Service: Orthopedics;  Laterality: Right;  Right Total Knee Arthroplasty    REVIEW OF SYSTEMS:  Pertinent items are noted in HPI.   HEALTH MAINTENANCE:   PHYSICAL EXAMINATION: Blood pressure 109/67, pulse 69, temperature 98.2 F (36.8 C), temperature source Oral, resp. rate 20, height 5\' 7"  (1.702 m), weight 198 lb 4.8 oz (89.948 kg). Body mass index is 31.05 kg/(m^2). ECOG PERFORMANCE STATUS: 1 - Symptomatic but completely ambulatory HEENT exam EOMI PERRLA sclerae anicteric no conjunctival pallor oral mucosa is moist neck is supple lungs are clear bilaterally to auscultation and percussion cardiovascular is regular rate rhythm no murmurs gallops or rubs abdomen is soft nontender nondistended bowel sounds are present no hepatosplenomegaly extremities no edema neuro patient's alert oriented otherwise nonfocal left breast no masses or nipple discharge right breast reveals well-healed incisional scar there is no nodularity no masses no tenderness no skin changes   LABORATORY DATA: Lab Results  Component Value Date   WBC 4.9 01/25/2013   HGB 13.0 01/25/2013   HCT 38.9 01/25/2013   MCV 93.7 01/25/2013   PLT 172 01/25/2013      Chemistry      Component Value Date/Time   NA 141 01/25/2013 0909   NA 140 01/12/2013 0224   NA 136 03/13/2010 1158   K 3.8 01/25/2013 0909   K 4.7 01/12/2013 0224   K 3.9 03/13/2010 1158   CL 102 01/25/2013 0909   CL 104 01/12/2013 0224   CL 101 03/13/2010 1158   CO2 29 01/25/2013 0909   CO2 30 06/03/2012 2045   CO2 30 03/13/2010 1158   BUN 16.3 01/25/2013 0909   BUN 22 01/12/2013 0224   BUN 14 03/13/2010 1158   CREATININE 0.8 01/25/2013 0909   CREATININE 0.80 01/12/2013 0224   CREATININE 0.8 03/13/2010 1158      Component Value Date/Time   CALCIUM 9.8 01/25/2013 0909   CALCIUM 9.8 06/03/2012 2045   CALCIUM 9.7 03/13/2010 1158    ALKPHOS 68 01/25/2013 0909   ALKPHOS 87 06/03/2012 2045   ALKPHOS 65 03/13/2010 1158   AST 24 01/25/2013 0909   AST 21 06/03/2012 2045   AST 31 03/13/2010 1158   ALT 18 01/25/2013 0909   ALT 14 06/03/2012 2045   BILITOT 0.51 01/25/2013 0909   BILITOT 0.2* 06/03/2012 2045   BILITOT 0.50 03/13/2010 1158       RADIOGRAPHIC STUDIES:  Dg Chest 2 View  06/03/2012  *RADIOLOGY REPORT*  Clinical Data: Fall, chest pain.  CHEST - 2 VIEW  Comparison: 10/04/2011  Findings: Cardiomediastinal contours unchanged.  Mild aortic arch atherosclerosis.  Lungs remain predominately clear.  Mild mid thoracic degenerative changes.  No acute osseous finding.  IMPRESSION: No radiographic evidence of acute cardiopulmonary process.   Original Report Authenticated By: Waneta Martins, M.D.    Dg Cervical Spine Complete  06/03/2012  *RADIOLOGY REPORT*  Clinical Data: Fall, neck discomfort.  CERVICAL SPINE - COMPLETE 4+ VIEW  Comparison: 12/04/2007 CT  Findings: Degenerative disc disease, most pronounced at C4-5 and C5- 6. Minimal anterolisthesis of C3 and C4 and minimal retrolisthesis of C5 on C6.  Otherwise, maintained vertebral body height and alignment. No acute fracture or dislocation is identified.  Absent dentition.  Clear apices.  Maintained C1-2 articulation.  No dens fracture. Paravertebral soft tissues within normal limits.  IMPRESSION: Degenerative changes of the mid cervical spine as above.  No acute osseous abnormality identified.   Original Report Authenticated By: Waneta Martins, M.D.    Dg Thoracic Spine 2 View  06/03/2012  *RADIOLOGY REPORT*  Clinical Data: Fall with mid back pain.  THORACIC SPINE - 2 VIEW  Comparison: 10/04/2011 and prior chest radiographs  Findings: A mild thoracic scoliosis is identified. There is no evidence of fracture or subluxation. Mild multilevel degenerative disc disease noted. No focal bony lesions are present.  IMPRESSION: No evidence of acute abnormality.  Unchanged mild scoliosis.    Original Report Authenticated By: Rosendo Gros, M.D.    Dg Hip Complete Right  06/03/2012  *RADIOLOGY REPORT*  Clinical Data: Fall with right hip pain.  RIGHT HIP - COMPLETE 2+ VIEW  Comparison: None  Findings: There is no evidence of acute fracture, subluxation or dislocation. There is joint space is unremarkable. No focal bony lesions are present. Mild degenerative changes of the lower lumbar spine noted.  IMPRESSION: No evidence of acute abnormality.   Original Report Authenticated By: Rosendo Gros, M.D.    Ct Head Wo Contrast  06/04/2012  *RADIOLOGY REPORT*  Clinical Data: 62 year old female with loss of consciousness. History of breast cancer.  CT HEAD WITHOUT CONTRAST  Technique:  Contiguous axial images were obtained from the base of the skull through the vertex without contrast.  Comparison: 12/04/2007 CT  Findings: No intracranial abnormalities are identified, including mass lesion or mass effect, hydrocephalus, extra-axial fluid collection, midline shift, hemorrhage, or acute infarction.  The visualized bony calvarium is unremarkable.  IMPRESSION: Unremarkable noncontrast head CT.   Original Report Authenticated By: Rosendo Gros, M.D.     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 She underwent lumpectomy followed by radiation and then began aromatase inhibitors but she could not tolerate and that was changed to tamoxifen 20 mg daily which she took for about 2 years and then discontinued due to side effects. At this time she does not want to go on any other medications and just wants to be followed expectantly.   PLAN:  #1 patient will continue to be observed on an every 6 month basis.  All questions were answered. The patient knows to call the clinic with any problems, questions or concerns. We can certainly see the patient much sooner if necessary.  I spent 25 minutes  counseling the patient face to face. The total time spent in the appointment was 30 minutes.    Drue Second, MD Medical/Oncology [facility] [phone] (beeper) [phone] (Office)  01/25/2013, 10:01 AM

## 2013-03-14 ENCOUNTER — Other Ambulatory Visit: Payer: Self-pay | Admitting: Oncology

## 2013-03-14 DIAGNOSIS — Z853 Personal history of malignant neoplasm of breast: Secondary | ICD-10-CM

## 2013-03-26 ENCOUNTER — Ambulatory Visit
Admission: RE | Admit: 2013-03-26 | Discharge: 2013-03-26 | Disposition: A | Discharge status: Home / Self Care | Payer: Medicare Other | Source: Ambulatory Visit | Attending: Oncology | Admitting: Oncology

## 2013-03-26 DIAGNOSIS — Z853 Personal history of malignant neoplasm of breast: Secondary | ICD-10-CM

## 2013-06-21 ENCOUNTER — Telehealth: Payer: Self-pay | Admitting: *Deleted

## 2013-06-21 NOTE — Telephone Encounter (Signed)
Pt came in to change her appts from 08/06/13 to 07/30/13. Printed her an appt for 07/30/13 w/labs @12  noon and ov@ 12:30pm....td

## 2013-07-17 ENCOUNTER — Emergency Department (HOSPITAL_COMMUNITY): Payer: Medicare Other

## 2013-07-17 ENCOUNTER — Emergency Department (HOSPITAL_COMMUNITY)
Admission: EM | Admit: 2013-07-17 | Discharge: 2013-07-17 | Disposition: A | Discharge status: Home / Self Care | Payer: Medicare Other | Attending: Emergency Medicine | Admitting: Emergency Medicine

## 2013-07-17 ENCOUNTER — Encounter (HOSPITAL_COMMUNITY): Payer: Self-pay | Admitting: Emergency Medicine

## 2013-07-17 DIAGNOSIS — N189 Chronic kidney disease, unspecified: Secondary | ICD-10-CM | POA: Insufficient documentation

## 2013-07-17 DIAGNOSIS — M329 Systemic lupus erythematosus, unspecified: Secondary | ICD-10-CM | POA: Insufficient documentation

## 2013-07-17 DIAGNOSIS — Z79899 Other long term (current) drug therapy: Secondary | ICD-10-CM | POA: Insufficient documentation

## 2013-07-17 DIAGNOSIS — R002 Palpitations: Secondary | ICD-10-CM

## 2013-07-17 DIAGNOSIS — M129 Arthropathy, unspecified: Secondary | ICD-10-CM | POA: Insufficient documentation

## 2013-07-17 DIAGNOSIS — Z91040 Latex allergy status: Secondary | ICD-10-CM | POA: Insufficient documentation

## 2013-07-17 DIAGNOSIS — Z880 Allergy status to penicillin: Secondary | ICD-10-CM | POA: Insufficient documentation

## 2013-07-17 DIAGNOSIS — I129 Hypertensive chronic kidney disease with stage 1 through stage 4 chronic kidney disease, or unspecified chronic kidney disease: Secondary | ICD-10-CM | POA: Insufficient documentation

## 2013-07-17 DIAGNOSIS — K219 Gastro-esophageal reflux disease without esophagitis: Secondary | ICD-10-CM | POA: Insufficient documentation

## 2013-07-17 LAB — BASIC METABOLIC PANEL
BUN: 11 mg/dL (ref 6–23)
CO2: 27 mEq/L (ref 19–32)
Calcium: 9.7 mg/dL (ref 8.4–10.5)
Chloride: 100 mEq/L (ref 96–112)
Creatinine, Ser: 0.73 mg/dL (ref 0.50–1.10)
GFR calc Af Amer: >90 mL/min (ref >90)
GFR calc non Af Amer: 88 mL/min — ABNORMAL LOW (ref >90)
Glucose, Bld: 97 mg/dL (ref 70–99)
Potassium: 3.2 mEq/L — ABNORMAL LOW (ref 3.5–5.1)
Sodium: 137 mEq/L (ref 135–145)

## 2013-07-17 LAB — T4, FREE: Free T4: 1.22 ng/dL (ref 0.80–1.80)

## 2013-07-17 LAB — CBC
HCT: 39.3 % (ref 36.0–46.0)
Hemoglobin: 13.1 g/dL (ref 12.0–15.0)
MCH: 30.9 pg (ref 26.0–34.0)
MCHC: 33.3 g/dL (ref 30.0–36.0)
MCV: 92.7 fL (ref 78.0–100.0)
Platelets: 190 10*3/uL (ref 150–400)
RBC: 4.24 MIL/uL (ref 3.87–5.11)
RDW: 13.1 % (ref 11.5–15.5)
WBC: 5.1 10*3/uL (ref 4.0–10.5)

## 2013-07-17 LAB — TROPONIN I: Troponin I: <0.3 ng/mL (ref <0.30)

## 2013-07-17 LAB — TSH: TSH: 1.31 u[IU]/mL (ref 0.350–4.500)

## 2013-07-17 MED ORDER — POTASSIUM CHLORIDE CRYS ER 20 MEQ PO TBCR
40.0000 meq | EXTENDED_RELEASE_TABLET | Freq: Once | ORAL | Status: AC
  Administered 2013-07-17: 40 meq via ORAL
  Filled 2013-07-17: qty 2

## 2013-07-17 MED ORDER — OMEPRAZOLE 20 MG PO CPDR: 20.0000 mg | DELAYED_RELEASE_CAPSULE | Freq: Every day | ORAL | Status: DC

## 2013-07-17 MED ORDER — GI COCKTAIL ~~LOC~~
30.0000 mL | Freq: Once | ORAL | Status: AC
  Administered 2013-07-17: 30 mL via ORAL
  Filled 2013-07-17: qty 30

## 2013-07-17 NOTE — ED Notes (Signed)
Pt states she has been having palpitations all day and her blood pressure has been fluctuating  Pt states she feels like her chest is pounding at the present time  Pt states in triage that if her ekg is ok she is going home  Encouraged pt to stay for monitoring and pt states she will come back if it starts again

## 2013-07-17 NOTE — ED Notes (Signed)
Pt does not want to be hooked up to the cardiac monitor or put on a gown for staff.

## 2013-07-17 NOTE — ED Provider Notes (Signed)
CSN: [id number]     Arrival date & time 07/17/13  0122 History   First MD Initiated Contact with Patient 07/17/13 0222     Chief Complaint  Patient presents with  . Palpitations    The history is provided by the patient.   patient feels like she's been having palpitations through most of the day.  No syncope.  She reports a feeling of "beading in my chest".  She denies shortness of breath.  No cough or congestion.  No nausea or vomiting.  No history of cardiac disease.  She states she has had palpitations before in the past and at that time saw cardiology back several years ago and had a "full workup" and demonstrated no pathology.  It does not sound like she ever wore a Holter monitor.  The patient was placed on metoprolol at that time for her palpitations.  She reports no chest discomfort or chest pain at this time.  Her symptoms are mild in severity.  Nothing worsens or improves her symptoms  Past Medical History  Diagnosis Date  . Hypertension   . Heart murmur     functional murmur, echo in [place]- long time ago   . Lupus   . Blood transfusion     1986- post-partial hysterectomy  . Hepatitis     Hep. A- 1978  . Chronic kidney disease   . GERD (gastroesophageal reflux disease)     recently taken off anti reflux tx  . Arthritis     R knee, hands   . No pertinent past medical history     will see cardiac for review this week, 08/24/2011  . rt breast ca dx'd 12/2007    xrt comp;   Past Surgical History  Procedure Laterality Date  . Joint replacement      L knee replacement- 2006, arthroscopic surg. both knees   . Mastectomy      R breast- lumpectomy -2009  . Tonsillectomy      1954  . Appendectomy      1978 & tubal ligation   . Abdominal hysterectomy      1986  . Tubal ligation    . Breast surgery      R breast lumpectomy  . Cholecystectomy      1978- open   . Total knee arthroplasty  08/30/2011    Procedure: TOTAL KNEE ARTHROPLASTY;  Surgeon: Nestor Lewandowsky;  Location: [facility]  OR;  Service: Orthopedics;  Laterality: Right;  Right Total Knee Arthroplasty   Family History  Problem Relation Age of Onset  . Anesthesia problems Neg Hx   . Hypotension Neg Hx   . Malignant hyperthermia Neg Hx   . Pseudochol deficiency Neg Hx   . Heart attack Father   . Cancer Other   . Hypertension Other    History  Substance Use Topics  . Smoking status: Never Smoker   . Smokeless tobacco: Not on file  . Alcohol Use: No   OB History   Grav Para Term Preterm Abortions TAB SAB Ect Mult Living                 Review of Systems  Cardiovascular: Positive for palpitations.  All other systems reviewed and are negative.    Allergies  Iodine; Iohexol; Latex; Penicillins; Tetracycline; and Milk-related compounds  Home Medications   Current Outpatient Rx  Name  Route  Sig  Dispense  Refill  . Cholecalciferol (VITAMIN D3) 2000 UNITS TABS   Oral  Take 1 tablet by mouth every morning.          . Ginkgo Biloba 100 MG CAPS   Oral   Take by mouth.         . hydrochlorothiazide (HYDRODIURIL) 25 MG tablet   Oral   Take 25 mg by mouth every morning.          Marland Kitchen HYDROcodone-acetaminophen (NORCO/VICODIN) 5-325 MG per tablet   Oral   Take 1 tablet by mouth every 6 (six) hours as needed for pain.         . metoprolol tartrate (LOPRESSOR) 25 MG tablet   Oral   Take 25 mg by mouth 2 (two) times daily.         . nitrofurantoin, macrocrystal-monohydrate, (MACROBID) 100 MG capsule   Oral   Take 1 capsule (100 mg total) by mouth 2 (two) times daily.   14 capsule   0   . omeprazole (PRILOSEC) 20 MG capsule   Oral   Take 1 capsule (20 mg total) by mouth daily.   30 capsule   0   . oxyCODONE-acetaminophen (PERCOCET/ROXICET) 5-325 MG per tablet   Oral   Take 1 tablet by mouth every 4 (four) hours as needed for pain.   20 tablet   0   . sulfamethoxazole-trimethoprim (BACTRIM DS) 800-160 MG per tablet               . zolpidem (AMBIEN) 5 MG tablet   Oral    Take 5 mg by mouth at bedtime as needed.          BP 160/82  Pulse 48  Temp(Src) 98.3 F (36.8 C) (Oral)  Resp 16  SpO2 99% Physical Exam  Nursing note and vitals reviewed. Constitutional: She is oriented to person, place, and time. She appears well-developed and well-nourished. No distress.  HENT:  Head: Normocephalic and atraumatic.  Eyes: EOM are normal.  Neck: Normal range of motion.  Cardiovascular: Normal rate, regular rhythm and normal heart sounds.   Pulmonary/Chest: Effort normal and breath sounds normal.  Abdominal: Soft. She exhibits no distension. There is no tenderness.  Musculoskeletal: Normal range of motion.  Neurological: She is alert and oriented to person, place, and time.  Skin: Skin is warm and dry.  Psychiatric: She has a normal mood and affect. Judgment normal.    ED Course  Procedures (including critical care time) Labs Review Labs Reviewed  BASIC METABOLIC PANEL - Abnormal; Notable for the following:    Potassium 3.2 (*)    GFR calc non Af Amer 88 (*)    All other components within normal limits  CBC  TROPONIN I  TSH  T4, FREE   Imaging Review Dg Chest 2 View  07/17/2013   CLINICAL DATA:  Heart palpitations  EXAM: CHEST  2 VIEW  COMPARISON:  06/03/2012  FINDINGS: The heart size and mediastinal contours are within normal limits. Both lungs are clear. Diffuse thoracic degenerative disc narrowing. Mild dextro curvature of the thoracic spine.  IMPRESSION: No active cardiopulmonary disease.   Electronically Signed   By: Tiburcio Pea M.D.   On: 07/17/2013 03:23    EKG Interpretation     Ventricular Rate:  47 PR Interval:  178 QRS Duration: 87 QT Interval:  524 QTC Calculation: 463 R Axis:   49 Text Interpretation:  Sinus bradycardia Abnormal R-wave progression, early transition No significant change was found            MDM   1.  Palpitations   2. GERD (gastroesophageal reflux disease)    4:36 AM Patient feels much better  this time after GI cocktail.  Much of this is likely heartburn.  Doubt ACS.  Doubt pulmonary embolism.  EKG normal sinus rhythm.  Mild hypokalemia replaced orally.  I don't believe this was likely playing aerobic.  Outpatient cardiology followup.  Thyroid studies pending.  Home on a short course of Prilosec.  She understands to return to the ER for new or worsening symptoms.  PCP and cardiology followup    Lyanne Co, MD 07/17/13 [phone]

## 2013-07-30 ENCOUNTER — Ambulatory Visit (HOSPITAL_BASED_OUTPATIENT_CLINIC_OR_DEPARTMENT_OTHER): Payer: Medicare Other | Admitting: Oncology

## 2013-07-30 ENCOUNTER — Telehealth: Payer: Self-pay | Admitting: *Deleted

## 2013-07-30 ENCOUNTER — Other Ambulatory Visit (HOSPITAL_BASED_OUTPATIENT_CLINIC_OR_DEPARTMENT_OTHER): Payer: Medicare Other | Admitting: Lab

## 2013-07-30 ENCOUNTER — Encounter: Payer: Self-pay | Admitting: Oncology

## 2013-07-30 VITALS — BP 130/81 | HR 60 | Temp 98.6°F | Resp 18 | Ht 67.0 in | Wt 203.7 lb

## 2013-07-30 DIAGNOSIS — C50419 Malignant neoplasm of upper-outer quadrant of unspecified female breast: Secondary | ICD-10-CM

## 2013-07-30 DIAGNOSIS — C50919 Malignant neoplasm of unspecified site of unspecified female breast: Secondary | ICD-10-CM

## 2013-07-30 LAB — CBC WITH DIFFERENTIAL/PLATELET
BASO%: 0.7 % (ref 0.0–2.0)
Basophils Absolute: 0 10*3/uL (ref 0.0–0.1)
EOS%: 1.9 % (ref 0.0–7.0)
Eosinophils Absolute: 0.1 10*3/uL (ref 0.0–0.5)
HCT: 38.5 % (ref 34.8–46.6)
HGB: 12.5 g/dL (ref 11.6–15.9)
LYMPH%: 40.1 % (ref 14.0–49.7)
MCH: 30.5 pg (ref 25.1–34.0)
MCHC: 32.5 g/dL (ref 31.5–36.0)
MCV: 93.9 fL (ref 79.5–101.0)
MONO#: 0.8 10*3/uL (ref 0.1–0.9)
MONO%: 13.7 % (ref 0.0–14.0)
NEUT#: 2.4 10*3/uL (ref 1.5–6.5)
NEUT%: 43.6 % (ref 38.4–76.8)
Platelets: 191 10*3/uL (ref 145–400)
RBC: 4.1 10*6/uL (ref 3.70–5.45)
RDW: 13.6 % (ref 11.2–14.5)
WBC: 5.6 10*3/uL (ref 3.9–10.3)
lymph#: 2.2 10*3/uL (ref 0.9–3.3)

## 2013-07-30 LAB — COMPREHENSIVE METABOLIC PANEL (CC13)
ALT: 18 U/L (ref 0–55)
AST: 24 U/L (ref 5–34)
Albumin: 3.6 g/dL (ref 3.5–5.0)
Alkaline Phosphatase: 72 U/L (ref 40–150)
Anion Gap: 11 mEq/L (ref 3–11)
BUN: 18.4 mg/dL (ref 7.0–26.0)
CO2: 25 mEq/L (ref 22–29)
Calcium: 9.7 mg/dL (ref 8.4–10.4)
Chloride: 104 mEq/L (ref 98–109)
Creatinine: 0.8 mg/dL (ref 0.6–1.1)
Glucose: 82 mg/dl (ref 70–140)
Potassium: 3.7 mEq/L (ref 3.5–5.1)
Sodium: 140 mEq/L (ref 136–145)
Total Bilirubin: 0.47 mg/dL (ref 0.20–1.20)
Total Protein: 7.4 g/dL (ref 6.4–8.3)

## 2013-07-30 MED ORDER — TAMOXIFEN CITRATE 20 MG PO TABS: 20.0000 mg | ORAL_TABLET | Freq: Every day | ORAL | Status: DC

## 2013-07-30 NOTE — Progress Notes (Signed)
OFFICE PROGRESS NOTE  CC  [name], MD 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  PRIOR THERAPY:  #1 patient is status post lumpectomy with sentinel lymph node biopsy followed by radiation to the right breast. In December 2009 patient began aromatase inhibitor but could not tolerate it.  #2 therefore she began in March 2010 tamoxifen 20 mg daily. However she has experienced significant amount of aches and pains and in December 2012 discontinued it. At this time she does not want to go back on any kind of antiestrogen therapy due to side effects.  #3 patient would like to go back on tamoxifen 20 mg daily. She recently attended finding your new normal seminar. And because of that she wants to she would like to go back on the tamoxifen. We discussed side effects again. She was sent to her pharmacy.  CURRENT THERAPY: Observation  INTERVAL HISTORY: [name] 62 y.o. female returns for followup visit today. Clinically patient seems to be doing well. 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 She has no nausea or vomiting no fevers chills night sweats. She does have some vaginal dryness and she has been using lubricants. She denies any hot flashes no peripheral paresthesias no vaginal bleeding or discharge. Remainder of the 10 point review of systems is negative. MEDICAL HISTORY: Past Medical History  Diagnosis Date  . Hypertension   . Heart murmur     functional murmur, echo in [place]- long time ago   . Lupus   . Blood transfusion     1986- post-partial hysterectomy  . Hepatitis     Hep. A- 1978  . Chronic kidney disease   . GERD (gastroesophageal reflux disease)     recently taken off anti reflux tx  . Arthritis     R knee, hands   . No pertinent past medical history     will see cardiac for review this week,  08/24/2011  . rt breast ca dx'd 12/2007    xrt comp;    ALLERGIES:  is allergic to iodine; iohexol; latex; penicillins; tetracycline; and milk-related compounds.  MEDICATIONS:  Current Outpatient Prescriptions  Medication Sig Dispense Refill  . Cholecalciferol (VITAMIN D3) 2000 UNITS TABS Take 1 tablet by mouth every morning.       . Ginkgo Biloba 100 MG CAPS Take by mouth.      . hydrochlorothiazide (HYDRODIURIL) 25 MG tablet Take 25 mg by mouth every morning.       Marland Kitchen ibuprofen (ADVIL,MOTRIN) 800 MG tablet       . metoprolol succinate (TOPROL-XL) 25 MG 24 hr tablet       . metoprolol tartrate (LOPRESSOR) 25 MG tablet Take 25 mg by mouth 2 (two) times daily.      Marland Kitchen omeprazole (PRILOSEC) 20 MG capsule Take 1 capsule (20 mg total) by mouth daily.  30 capsule  0  . zolpidem (AMBIEN) 5 MG tablet Take 5 mg by mouth at bedtime as needed.      Marland Kitchen HYDROcodone-acetaminophen (NORCO/VICODIN) 5-325 MG per tablet Take 1 tablet by mouth every 6 (six) hours as needed for pain.      . tamoxifen (NOLVADEX) 20 MG tablet Take 1 tablet (20 mg total) by mouth daily.  90 tablet  12   No current facility-administered medications for this visit.    SURGICAL HISTORY:  Past Surgical  History  Procedure Laterality Date  . Joint replacement      L knee replacement- 2006, arthroscopic surg. both knees   . Mastectomy      R breast- lumpectomy -2009  . Tonsillectomy      1954  . Appendectomy      1978 & tubal ligation   . Abdominal hysterectomy      1986  . Tubal ligation    . Breast surgery      R breast lumpectomy  . Cholecystectomy      1978- open   . Total knee arthroplasty  08/30/2011    Procedure: TOTAL KNEE ARTHROPLASTY;  Surgeon: Nestor Lewandowsky;  Location: [facility] OR;  Service: Orthopedics;  Laterality: Right;  Right Total Knee Arthroplasty    REVIEW OF SYSTEMS:  Pertinent items are noted in HPI.   HEALTH MAINTENANCE:   PHYSICAL EXAMINATION: Blood pressure 130/81, pulse 60, temperature 98.6 F  (37 C), temperature source Oral, resp. rate 18, height 5\' 7"  (1.702 m), weight 203 lb 11.2 oz (92.398 kg). Body mass index is 31.9 kg/(m^2). ECOG PERFORMANCE STATUS: 1 - Symptomatic but completely ambulatory HEENT exam EOMI PERRLA sclerae anicteric no conjunctival pallor oral mucosa is moist neck is supple lungs are clear bilaterally to auscultation and percussion cardiovascular is regular rate rhythm no murmurs gallops or rubs abdomen is soft nontender nondistended bowel sounds are present no hepatosplenomegaly extremities no edema neuro patient's alert oriented otherwise nonfocal left breast no masses or nipple discharge right breast reveals well-healed incisional scar there is no nodularity no masses no tenderness no skin changes   LABORATORY DATA: Lab Results  Component Value Date   WBC 5.6 07/30/2013   HGB 12.5 07/30/2013   HCT 38.5 07/30/2013   MCV 93.9 07/30/2013   PLT 191 07/30/2013      Chemistry      Component Value Date/Time   NA 140 07/30/2013 1211   NA 137 07/17/2013 0255   NA 136 03/13/2010 1158   K 3.7 07/30/2013 1211   K 3.2* 07/17/2013 0255   K 3.9 03/13/2010 1158   CL 100 07/17/2013 0255   CL 102 01/25/2013 0909   CL 101 03/13/2010 1158   CO2 25 07/30/2013 1211   CO2 27 07/17/2013 0255   CO2 30 03/13/2010 1158   BUN 18.4 07/30/2013 1211   BUN 11 07/17/2013 0255   BUN 14 03/13/2010 1158   CREATININE 0.8 07/30/2013 1211   CREATININE 0.73 07/17/2013 0255   CREATININE 0.8 03/13/2010 1158      Component Value Date/Time   CALCIUM 9.7 07/30/2013 1211   CALCIUM 9.7 07/17/2013 0255   CALCIUM 9.7 03/13/2010 1158   ALKPHOS 72 07/30/2013 1211   ALKPHOS 87 06/03/2012 2045   ALKPHOS 65 03/13/2010 1158   AST 24 07/30/2013 1211   AST 21 06/03/2012 2045   AST 31 03/13/2010 1158   ALT 18 07/30/2013 1211   ALT 14 06/03/2012 2045   ALT 32 03/13/2010 1158   BILITOT 0.47 07/30/2013 1211   BILITOT 0.2* 06/03/2012 2045   BILITOT 0.50 03/13/2010 1158       RADIOGRAPHIC STUDIES:  Dg  Chest 2 View  06/03/2012  *RADIOLOGY REPORT*  Clinical Data: Fall, chest pain.  CHEST - 2 VIEW  Comparison: 10/04/2011  Findings: Cardiomediastinal contours unchanged.  Mild aortic arch atherosclerosis.  Lungs remain predominately clear.  Mild mid thoracic degenerative changes.  No acute osseous finding.  IMPRESSION: No radiographic evidence of acute cardiopulmonary process.   Original Report  Authenticated By: Waneta Martins, M.D.    Dg Cervical Spine Complete  06/03/2012  *RADIOLOGY REPORT*  Clinical Data: Fall, neck discomfort.  CERVICAL SPINE - COMPLETE 4+ VIEW  Comparison: 12/04/2007 CT  Findings: Degenerative disc disease, most pronounced at C4-5 and C5- 6. Minimal anterolisthesis of C3 and C4 and minimal retrolisthesis of C5 on C6.  Otherwise, maintained vertebral body height and alignment. No acute fracture or dislocation is identified.  Absent dentition.  Clear apices.  Maintained C1-2 articulation.  No dens fracture. Paravertebral soft tissues within normal limits.  IMPRESSION: Degenerative changes of the mid cervical spine as above.  No acute osseous abnormality identified.   Original Report Authenticated By: Waneta Martins, M.D.    Dg Thoracic Spine 2 View  06/03/2012  *RADIOLOGY REPORT*  Clinical Data: Fall with mid back pain.  THORACIC SPINE - 2 VIEW  Comparison: 10/04/2011 and prior chest radiographs  Findings: A mild thoracic scoliosis is identified. There is no evidence of fracture or subluxation. Mild multilevel degenerative disc disease noted. No focal bony lesions are present.  IMPRESSION: No evidence of acute abnormality.  Unchanged mild scoliosis.   Original Report Authenticated By: Rosendo Gros, M.D.    Dg Hip Complete Right  06/03/2012  *RADIOLOGY REPORT*  Clinical Data: Fall with right hip pain.  RIGHT HIP - COMPLETE 2+ VIEW  Comparison: None  Findings: There is no evidence of acute fracture, subluxation or dislocation. There is joint space is unremarkable. No focal bony  lesions are present. Mild degenerative changes of the lower lumbar spine noted.  IMPRESSION: No evidence of acute abnormality.   Original Report Authenticated By: Rosendo Gros, M.D.    Ct Head Wo Contrast  06/04/2012  *RADIOLOGY REPORT*  Clinical Data: 62 year old female with loss of consciousness. History of breast cancer.  CT HEAD WITHOUT CONTRAST  Technique:  Contiguous axial images were obtained from the base of the skull through the vertex without contrast.  Comparison: 12/04/2007 CT  Findings: No intracranial abnormalities are identified, including mass lesion or mass effect, hydrocephalus, extra-axial fluid collection, midline shift, hemorrhage, or acute infarction.  The visualized bony calvarium is unremarkable.  IMPRESSION: Unremarkable noncontrast head CT.   Original Report Authenticated By: Rosendo Gros, M.D.     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 She underwent lumpectomy followed by radiation and then began aromatase inhibitors but she could not tolerate and that was changed to tamoxifen 20 mg daily which she took for about 2 years and then discontinued due to side effects.  #2 patient will like to go back on tamoxifen 20 mg daily. We will start this we again discussed side effects. She was given literature on it. Plan would be to give her 5 years of tamoxifen.  PLAN: #1 tamoxifen 20 mg daily.  #2 patient will be seen back in 6 months time for followup.  All questions were answered. The patient knows to call the clinic with any problems, questions or concerns. We can certainly see the patient much sooner if necessary.  I spent 25 minutes counseling the patient face to face. The total time spent in the appointment was 30 minutes.    Drue Second, MD Medical/Oncology [facility] [phone] (beeper) [phone] (Office)  07/30/2013, 1:23 PM

## 2013-07-30 NOTE — Patient Instructions (Signed)
Proceed with tamoxifen 20 mg daily  We will see you back in 6 months  Tamoxifen oral tablet What is this medicine? TAMOXIFEN (ta MOX i fen) blocks the effects of estrogen. It is commonly used to treat breast cancer. It is also used to decrease the chance of breast cancer coming back in women who have received treatment for the disease. It may also help prevent breast cancer in women who have a high risk of developing breast cancer. This medicine may be used for other purposes; ask your health care provider or pharmacist if you have questions. COMMON BRAND NAME(S): Nolvadex What should I tell my health care provider before I take this medicine? They need to know if you have any of these conditions: -blood clots -blood disease -cataracts or impaired eyesight -endometriosis -high calcium levels -high cholesterol -irregular menstrual cycles -liver disease -stroke -uterine fibroids -an unusual or allergic reaction to tamoxifen, other medicines, foods, dyes, or preservatives -pregnant or trying to get pregnant -breast-feeding How should I use this medicine? Take this medicine by mouth with a glass of water. Follow the directions on the prescription label. You can take it with or without food. Take your medicine at regular intervals. Do not take your medicine more often than directed. Do not stop taking except on your doctor's advice. A special MedGuide will be given to you by the pharmacist with each prescription and refill. Be sure to read this information carefully each time. Talk to your pediatrician regarding the use of this medicine in children. While this drug may be prescribed for selected conditions, precautions do apply. Overdosage: If you think you have taken too much of this medicine contact a poison control center or emergency room at once. NOTE: This medicine is only for you. Do not share this medicine with others. What if I miss a dose? If you miss a dose, take it as soon as  you can. If it is almost time for your next dose, take only that dose. Do not take double or extra doses. What may interact with this medicine? -aminoglutethimide -bromocriptine -chemotherapy drugs -female hormones, like estrogens and birth control pills -letrozole -medroxyprogesterone -phenobarbital -rifampin -warfarin This list may not describe all possible interactions. Give your health care provider a list of all the medicines, herbs, non-prescription drugs, or dietary supplements you use. Also tell them if you smoke, drink alcohol, or use illegal drugs. Some items may interact with your medicine. What should I watch for while using this medicine? Visit your doctor or health care professional for regular checks on your progress. You will need regular pelvic exams, breast exams, and mammograms. If you are taking this medicine to reduce your risk of getting breast cancer, you should know that this medicine does not prevent all types of breast cancer. If breast cancer or other problems occur, there is no guarantee that it will be found at an early stage. Do not become pregnant while taking this medicine or for 2 months after stopping this medicine. Stop taking this medicine if you get pregnant or think you are pregnant and contact your doctor. This medicine may harm your unborn baby. Women who can possibly become pregnant should use birth control methods that do not use hormones during tamoxifen treatment and for 2 months after therapy has stopped. Talk with your health care provider for birth control advice. Do not breast feed while taking this medicine. What side effects may I notice from receiving this medicine? Side effects that you should report to  your doctor or health care professional as soon as possible: -changes in vision (blurred vision) -changes in your menstrual cycle -difficulty breathing or shortness of breath -difficulty walking or talking -new breast lumps -numbness -pelvic  pain or pressure -redness, blistering, peeling or loosening of the skin, including inside the mouth -skin rash or itching (hives) -sudden chest pain -swelling of lips, face, or tongue -swelling, pain or tenderness in your calf or leg -unusual bruising or bleeding -vaginal discharge that is bloody, brown, or rust -weakness -yellowing of the whites of the eyes or skin Side effects that usually do not require medical attention (report to your doctor or health care professional if they continue or are bothersome): -fatigue -hair loss, although uncommon and is usually mild -headache -hot flashes -impotence (in men) -nausea, vomiting (mild) -vaginal discharge (white or clear) This list may not describe all possible side effects. Call your doctor for medical advice about side effects. You may report side effects to FDA at 1-800-FDA-1088. Where should I keep my medicine? Keep out of the reach of children. Store at room temperature between 20 and 25 degrees C (68 and 77 degrees F). Protect from light. Keep container tightly closed. Throw away any unused medicine after the expiration date. NOTE: This sheet is a summary. It may not cover all possible information. If you have questions about this medicine, talk to your doctor, pharmacist, or health care provider.  2014, [employer]. (2008-05-23 12:01:56)

## 2013-07-30 NOTE — Telephone Encounter (Signed)
appts made and printed...td 

## 2013-08-06 ENCOUNTER — Ambulatory Visit: Payer: Medicare Other | Admitting: Oncology

## 2013-08-06 ENCOUNTER — Other Ambulatory Visit: Payer: Medicare Other | Admitting: Lab

## 2013-09-05 ENCOUNTER — Encounter: Payer: Self-pay | Admitting: Cardiovascular Disease

## 2013-09-05 ENCOUNTER — Ambulatory Visit (INDEPENDENT_AMBULATORY_CARE_PROVIDER_SITE_OTHER): Payer: Medicare Other | Admitting: Cardiovascular Disease

## 2013-09-05 VITALS — BP 124/76 | HR 60 | Resp 16 | Ht 68.0 in | Wt 205.5 lb

## 2013-09-05 DIAGNOSIS — R002 Palpitations: Secondary | ICD-10-CM

## 2013-09-05 NOTE — Patient Instructions (Signed)
Dr. Royann Shivers has recommended that you wear a holter monitor. Holter monitors are medical devices that record the heart's electrical activity. Doctors most often use these monitors to diagnose arrhythmias. Arrhythmias are problems with the speed or rhythm of the heartbeat. The monitor is a small, portable device. You can wear one while you do your normal daily activities. This is usually used to diagnose what is causing palpitations/syncope (passing out).  WE WILL CALL YOU WITH THE RESULTS.  You only need to come back as needed.

## 2013-09-09 ENCOUNTER — Encounter: Payer: Self-pay | Admitting: Cardiovascular Disease

## 2013-09-09 DIAGNOSIS — R002 Palpitations: Secondary | ICD-10-CM | POA: Insufficient documentation

## 2013-09-09 NOTE — Progress Notes (Signed)
Patient ID: [name], female   DOB: 1950-10-22, 62 y.o.   MRN: [id number]      Reason for office visit Palpitations  [name] is now 62 years old and returns in followup for palpitations, that occur infrequently, are rather random in pattern and mild in severity. She was seen in the emergency room on October 28, and she was found to be mildly hypokalemic. She seems to be more puzzled by the irregularity of her heartbeat than truly symptomatic from the palpitations. Rarely she feels a little dizzy, and is not clear whether the dizziness and palpitations occurred synchronously. She has noticed that her pulse is slow when she has the palpitations. She denies chest pain or dyspnea on exertion. She has had one episode of syncope but this occurred when she slipped in her bathtub and struck her head. Most of her other complaints are related to orthopedic problems. She has bilateral total knee replacements. Despite this she is fairly active. In the past had problems with chest pain which resolved when she discontinued hydrochlorothiazide. A nuclear stress test performed in December of 2012 did not show any perfusion abnormalities in her left ventricular ejection fraction was 67%. She had right breast cancer in 2009 and underwent lumpectomy and is still on treatment with tamoxifen   Allergies  Allergen Reactions  . Iodine Anaphylaxis  . Iohexol Anaphylaxis     Code: HIVES, Desc: PT IS ALLERGIC TO IODINATED CONTRAST; REACTION INCLUDES FACIAL SWELLING,HIVES,RESPIRATORY DISTRESS;PT HASN'T HAD SCAN W/PREMEDS.;REFUSED CONTRAST OF ANY KIND!  KR Per pt; she never has had IV iodine; allergic to topical iodine--rash; no premeds for 05/07/11 scan per Dr Rito Ehrlich; no reaction; slg  . Latex Anaphylaxis  . Penicillins Anaphylaxis  . Tetracycline Anaphylaxis  . Milk-Related Compounds     Lactose intolerant    Current Outpatient Prescriptions  Medication Sig Dispense Refill  . Cholecalciferol (VITAMIN  D3) 2000 UNITS TABS Take 1 tablet by mouth every morning.       . Ginkgo Biloba 100 MG CAPS Take by mouth.      . hydrochlorothiazide (HYDRODIURIL) 25 MG tablet Take 25 mg by mouth every morning.       Marland Kitchen ibuprofen (ADVIL,MOTRIN) 800 MG tablet       . metoprolol tartrate (LOPRESSOR) 25 MG tablet Take 25 mg by mouth 2 (two) times daily.      Marland Kitchen omeprazole (PRILOSEC) 20 MG capsule Take 1 capsule (20 mg total) by mouth daily.  30 capsule  0  . tamoxifen (NOLVADEX) 20 MG tablet Take 1 tablet (20 mg total) by mouth daily.  90 tablet  12  . zolpidem (AMBIEN) 5 MG tablet Take 5 mg by mouth at bedtime as needed.       No current facility-administered medications for this visit.    Past Medical History  Diagnosis Date  . Hypertension   . Heart murmur     functional murmur, echo in [place]- long time ago   . Lupus   . Blood transfusion     1986- post-partial hysterectomy  . Hepatitis     Hep. A- 1978  . Chronic kidney disease   . GERD (gastroesophageal reflux disease)     recently taken off anti reflux tx  . Arthritis     R knee, hands   . No pertinent past medical history     will see cardiac for review this week, 08/24/2011  . rt breast ca dx'd 12/2007    xrt comp;  Past Surgical History  Procedure Laterality Date  . Joint replacement      L knee replacement- 2006, arthroscopic surg. both knees   . Mastectomy      R breast- lumpectomy -2009  . Tonsillectomy      1954  . Appendectomy      1978 & tubal ligation   . Abdominal hysterectomy      1986  . Tubal ligation    . Breast surgery      R breast lumpectomy  . Cholecystectomy      1978- open   . Total knee arthroplasty  08/30/2011    Procedure: TOTAL KNEE ARTHROPLASTY;  Surgeon: Nestor Lewandowsky;  Location: [facility];  Service: Orthopedics;  Laterality: Right;  Right Total Knee Arthroplasty    Family History  Problem Relation Age of Onset  . Anesthesia problems Neg Hx   . Hypotension Neg Hx   . Malignant hyperthermia Neg Hx   .  Pseudochol deficiency Neg Hx   . Heart attack Father   . Cancer Other   . Hypertension Other     History   Social History  . Marital Status: Married    Spouse Name: N/A    Number of Children: N/A  . Years of Education: N/A   Occupational History  . Not on file.   Social History Main Topics  . Smoking status: Never Smoker   . Smokeless tobacco: Not on file  . Alcohol Use: No  . Drug Use: No  . Sexual Activity: Yes   Other Topics Concern  . Not on file   Social History Narrative  . No narrative on file    Review of systems: The patient specifically denies any chest pain at rest or with exertion, dyspnea at rest or with exertion, orthopnea, paroxysmal nocturnal dyspnea, syncope, focal neurological deficits, intermittent claudication, lower extremity edema, unexplained weight gain, cough, hemoptysis or wheezing.  The patient also denies abdominal pain, nausea, vomiting, dysphagia, diarrhea, constipation, polyuria, polydipsia, dysuria, hematuria, frequency, urgency, abnormal bleeding or bruising, fever, chills, unexpected weight changes, mood swings, change in skin or hair texture, change in voice quality, auditory or visual problems, allergic reactions or rashes, new musculoskeletal complaints other than usual "aches and pains".   PHYSICAL EXAM BP 124/76  Pulse 60  Ht 5\' 8"  (1.727 m)  Wt 205 lb 8 oz (93.214 kg)  BMI 31.25 kg/m2  General: Alert, oriented x3, no distress Head: no evidence of trauma, PERRL, EOMI, no exophtalmos or lid lag, no myxedema, no xanthelasma; normal ears, nose and oropharynx Neck: normal jugular venous pulsations and no hepatojugular reflux; brisk carotid pulses without delay and no carotid bruits Chest: clear to auscultation, no signs of consolidation by percussion or palpation, normal fremitus, symmetrical and full respiratory excursions Cardiovascular: normal position and quality of the apical impulse, regular rhythm, normal first and second heart  sounds, no murmurs, rubs or gallops Abdomen: no tenderness or distention, no masses by palpation, no abnormal pulsatility or arterial bruits, normal bowel sounds, no hepatosplenomegaly Extremities: no clubbing, cyanosis or edema; 2+ radial, ulnar and brachial pulses bilaterally; 2+ right femoral, posterior tibial and dorsalis pedis pulses; 2+ left femoral, posterior tibial and dorsalis pedis pulses; no subclavian or femoral bruits Neurological: grossly nonfocal   EKG: Normal sinus rhythm  Lipid Panel     Component Value Date/Time   CHOL 143 11/24/2007 2222   TRIG 86 11/24/2007 2222   HDL 55 11/24/2007 2222   CHOLHDL 2.6 Ratio 11/24/2007 2222  VLDL 17 11/24/2007 2222   LDLCALC 71 11/24/2007 2222    BMET    Component Value Date/Time   NA 140 07/30/2013 1211   NA 137 07/17/2013 0255   NA 136 03/13/2010 1158   K 3.7 07/30/2013 1211   K 3.2* 07/17/2013 0255   K 3.9 03/13/2010 1158   CL 100 07/17/2013 0255   CL 102 01/25/2013 0909   CL 101 03/13/2010 1158   CO2 25 07/30/2013 1211   CO2 27 07/17/2013 0255   CO2 30 03/13/2010 1158   GLUCOSE 82 07/30/2013 1211   GLUCOSE 97 07/17/2013 0255   GLUCOSE 81 01/25/2013 0909   GLUCOSE 77 03/13/2010 1158   BUN 18.4 07/30/2013 1211   BUN 11 07/17/2013 0255   BUN 14 03/13/2010 1158   CREATININE 0.8 07/30/2013 1211   CREATININE 0.73 07/17/2013 0255   CREATININE 0.8 03/13/2010 1158   CALCIUM 9.7 07/30/2013 1211   CALCIUM 9.7 07/17/2013 0255   CALCIUM 9.7 03/13/2010 1158   GFRNONAA 88* 07/17/2013 0255   GFRAA >90 07/17/2013 0255     ASSESSMENT AND PLAN  [name] has relatively mild symptoms of palpitations, but wishes to receive reassurance that these are not serious. She does not have risk factors for cardiac disease. I suggested that she wear a arrhythmia monitor. We discussed the fact that in the absence of major structural heart disease (not evident by her physical exam or previous stress test) is less likely that she has truly serious arrhythmia.  She does not necessarily want to take any medication for her palpitations but just wants to know where they're coming from.  Patient Instructions  Dr. Royann Shivers has recommended that you wear a holter monitor. Holter monitors are medical devices that record the heart's electrical activity. Doctors most often use these monitors to diagnose arrhythmias. Arrhythmias are problems with the speed or rhythm of the heartbeat. The monitor is a small, portable device. You can wear one while you do your normal daily activities. This is usually used to diagnose what is causing palpitations/syncope (passing out).  WE WILL CALL YOU WITH THE RESULTS.  You only need to come back as needed.        Orders Placed This Encounter  Procedures  . EKG 12-Lead  . Holter monitor - 24 hour   No orders of the defined types were placed in this encounter.    Junious Silk, MD, [facility] [phone] office [phone] pager

## 2013-11-09 ENCOUNTER — Telehealth: Payer: Self-pay | Admitting: Oncology

## 2013-11-09 NOTE — Telephone Encounter (Signed)
, °

## 2014-01-10 ENCOUNTER — Other Ambulatory Visit: Payer: Self-pay | Admitting: *Deleted

## 2014-01-10 DIAGNOSIS — C50919 Malignant neoplasm of unspecified site of unspecified female breast: Secondary | ICD-10-CM

## 2014-01-10 MED ORDER — TAMOXIFEN CITRATE 20 MG PO TABS: 20.0000 mg | ORAL_TABLET | Freq: Every day | ORAL | Status: DC

## 2014-01-10 NOTE — Telephone Encounter (Signed)
PRESCRIPTION WAS FAXED TO [facility].

## 2014-02-08 ENCOUNTER — Telehealth: Payer: Self-pay | Admitting: Oncology

## 2014-02-08 NOTE — Telephone Encounter (Signed)
returned pt's call re message about 5/27 appt being moved to 6/8. pt asking can she see a doctor vs PA and what happened to [employer]. lmonvm for pt confirming next appt on schedule for lb/KC for 6/8 @ 11:15am. pt informed KK still out on unexpected loa and asked to call me back w/any questions.

## 2014-02-11 ENCOUNTER — Ambulatory Visit: Payer: Medicare Other | Admitting: Oncology

## 2014-02-11 ENCOUNTER — Other Ambulatory Visit: Payer: Medicare Other

## 2014-02-13 ENCOUNTER — Telehealth: Payer: Self-pay | Admitting: Oncology

## 2014-02-13 ENCOUNTER — Ambulatory Visit: Payer: Medicare Other | Admitting: Adult Health

## 2014-02-13 ENCOUNTER — Other Ambulatory Visit: Payer: Medicare Other

## 2014-02-13 NOTE — Telephone Encounter (Signed)
returned callto pt-pt wanted to see Dr not Dixie Dials had appt on 7/17-pt decided to keep appt w/PA-adv I would note the acct

## 2014-02-25 ENCOUNTER — Telehealth: Payer: Self-pay | Admitting: Oncology

## 2014-02-25 ENCOUNTER — Other Ambulatory Visit (HOSPITAL_BASED_OUTPATIENT_CLINIC_OR_DEPARTMENT_OTHER): Payer: Commercial Managed Care - HMO

## 2014-02-25 ENCOUNTER — Encounter: Payer: Self-pay | Admitting: Oncology

## 2014-02-25 ENCOUNTER — Ambulatory Visit (HOSPITAL_BASED_OUTPATIENT_CLINIC_OR_DEPARTMENT_OTHER): Payer: Commercial Managed Care - HMO | Admitting: Oncology

## 2014-02-25 VITALS — BP 155/88 | HR 59 | Temp 97.8°F | Resp 18 | Ht 68.0 in | Wt 200.6 lb

## 2014-02-25 DIAGNOSIS — Z853 Personal history of malignant neoplasm of breast: Secondary | ICD-10-CM

## 2014-02-25 DIAGNOSIS — C50919 Malignant neoplasm of unspecified site of unspecified female breast: Secondary | ICD-10-CM

## 2014-02-25 LAB — CBC WITH DIFFERENTIAL/PLATELET
BASO%: 0.9 % (ref 0.0–2.0)
Basophils Absolute: 0 10*3/uL (ref 0.0–0.1)
EOS%: 1.7 % (ref 0.0–7.0)
Eosinophils Absolute: 0.1 10*3/uL (ref 0.0–0.5)
HCT: 41.9 % (ref 34.8–46.6)
HGB: 13.7 g/dL (ref 11.6–15.9)
LYMPH%: 38.6 % (ref 14.0–49.7)
MCH: 30.7 pg (ref 25.1–34.0)
MCHC: 32.7 g/dL (ref 31.5–36.0)
MCV: 94 fL (ref 79.5–101.0)
MONO#: 0.6 10*3/uL (ref 0.1–0.9)
MONO%: 13.8 % (ref 0.0–14.0)
NEUT#: 1.8 10*3/uL (ref 1.5–6.5)
NEUT%: 45 % (ref 38.4–76.8)
Platelets: 198 10*3/uL (ref 145–400)
RBC: 4.45 10*6/uL (ref 3.70–5.45)
RDW: 13.8 % (ref 11.2–14.5)
WBC: 4.1 10*3/uL (ref 3.9–10.3)
lymph#: 1.6 10*3/uL (ref 0.9–3.3)

## 2014-02-25 LAB — COMPREHENSIVE METABOLIC PANEL (CC13)
ALT: 18 U/L (ref 0–55)
AST: 19 U/L (ref 5–34)
Albumin: 3.5 g/dL (ref 3.5–5.0)
Alkaline Phosphatase: 65 U/L (ref 40–150)
Anion Gap: 8 mEq/L (ref 3–11)
BUN: 11.1 mg/dL (ref 7.0–26.0)
CO2: 30 mEq/L — ABNORMAL HIGH (ref 22–29)
Calcium: 9.7 mg/dL (ref 8.4–10.4)
Chloride: 105 mEq/L (ref 98–109)
Creatinine: 0.8 mg/dL (ref 0.6–1.1)
Glucose: 87 mg/dl (ref 70–140)
Potassium: 3.9 mEq/L (ref 3.5–5.1)
Sodium: 142 mEq/L (ref 136–145)
Total Bilirubin: 0.58 mg/dL (ref 0.20–1.20)
Total Protein: 7.3 g/dL (ref 6.4–8.3)

## 2014-02-25 NOTE — Patient Instructions (Signed)
Hot flashes:  You can try natural Vitamin E 400 units daily.  You can use Astroglide for vaginal dryness.

## 2014-02-25 NOTE — Telephone Encounter (Signed)
gv and printed aptp sched and avs for pt fro July and Dec..Marland KitchenMarland Kitchen

## 2014-02-25 NOTE — Progress Notes (Signed)
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  PRIOR THERAPY:  #1 patient is status post lumpectomy with sentinel lymph node biopsy followed by radiation to the right breast. In December 2009 patient began aromatase inhibitor but could not tolerate it.  #2 therefore she began in March 2010 tamoxifen 20 mg daily. However she has experienced significant amount of aches and pains and in December 2012 discontinued it. At this time she does not want to go back on any kind of antiestrogen therapy due to side effects.  #3 patient would like to go back on tamoxifen 20 mg daily. She recently attended finding your new normal seminar. And because of that she wants to she would like to go back on the tamoxifen. We discussed side effects again. She was sent to her pharmacy.  CURRENT THERAPY: Tamoxifen 20 mg daily. She initially received approximately 2 years worth from March 2010 through December 2012 and then discontinued due to side effects. She then restarted tamoxifen 20 mg daily in November 2014 there remains on this today.  INTERVAL HISTORY: [name] 63 y.o. female returns for followup visit today. Clinically patient seems to be doing well. 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 She has no nausea or vomiting no fevers chills night sweats. She does have some vaginal dryness. She reports hot flashes no peripheral paresthesias no vaginal bleeding or discharge. She does not want a prescription medications for her hot flashes. She reports that she sometimes worries about her breast cancer and can become tearful at times and wants to know that this is normal. Remainder of the 10 point review of systems is negative.  MEDICAL HISTORY: Past Medical History  Diagnosis Date  . Hypertension   .  Heart murmur     functional murmur, echo in [place]- long time ago   . Lupus   . Blood transfusion     1986- post-partial hysterectomy  . Hepatitis     Hep. A- 1978  . Chronic kidney disease   . GERD (gastroesophageal reflux disease)     recently taken off anti reflux tx  . Arthritis     R knee, hands   . No pertinent past medical history     will see cardiac for review this week, 08/24/2011  . rt breast ca dx'd 12/2007    xrt comp;    ALLERGIES:  is allergic to iodine; iohexol; latex; penicillins; tetracycline; and milk-related compounds.  MEDICATIONS:  Current Outpatient Prescriptions  Medication Sig Dispense Refill  . Cholecalciferol (VITAMIN D3) 2000 UNITS TABS Take 1 tablet by mouth every morning.       . Ginkgo Biloba 100 MG CAPS Take by mouth.      . hydrochlorothiazide (HYDRODIURIL) 25 MG tablet Take 25 mg by mouth every morning.       Marland Kitchen ibuprofen (ADVIL,MOTRIN) 800 MG tablet       . metoprolol tartrate (LOPRESSOR) 25 MG tablet Take 25 mg by mouth 2 (two) times daily.      Marland Kitchen omeprazole (PRILOSEC) 20 MG capsule Take 1 capsule (20 mg total) by mouth daily.  30 capsule  0  . tamoxifen (NOLVADEX) 20 MG tablet Take 1 tablet (20 mg total) by mouth daily.  90 tablet  0  . zolpidem (AMBIEN) 5 MG tablet Take 5  mg by mouth at bedtime as needed.       No current facility-administered medications for this visit.    SURGICAL HISTORY:  Past Surgical History  Procedure Laterality Date  . Joint replacement      L knee replacement- 2006, arthroscopic surg. both knees   . Mastectomy      R breast- lumpectomy -2009  . Tonsillectomy      1954  . Appendectomy      1978 & tubal ligation   . Abdominal hysterectomy      1986  . Tubal ligation    . Breast surgery      R breast lumpectomy  . Cholecystectomy      1978- open   . Total knee arthroplasty  08/30/2011    Procedure: TOTAL KNEE ARTHROPLASTY;  Surgeon: Kerin Salen;  Location: [place];  Service: Orthopedics;  Laterality: Right;   Right Total Knee Arthroplasty    REVIEW OF SYSTEMS:  Pertinent items are noted in HPI.   HEALTH MAINTENANCE:   PHYSICAL EXAMINATION: Blood pressure 155/88, pulse 59, temperature 97.8 F (36.6 C), temperature source Oral, resp. rate 18, height 5\' 8"  (1.727 m), weight 200 lb 9.6 oz (90.992 kg), SpO2 100.00%. Body mass index is 30.51 kg/(m^2). ECOG PERFORMANCE STATUS: 1 - Symptomatic but completely ambulatory HEENT exam EOMI PERRLA sclerae anicteric no conjunctival pallor oral mucosa is moist neck is supple lungs are clear bilaterally to auscultation and percussion cardiovascular is regular rate rhythm no murmurs gallops or rubs abdomen is soft nontender nondistended bowel sounds are present no hepatosplenomegaly extremities no edema neuro patient's alert oriented otherwise nonfocal left breast no masses or nipple discharge right breast reveals well-healed incisional scar there is no nodularity no masses no tenderness no skin changes   LABORATORY DATA: Lab Results  Component Value Date   WBC 4.1 02/25/2014   HGB 13.7 02/25/2014   HCT 41.9 02/25/2014   MCV 94.0 02/25/2014   PLT 198 02/25/2014      Chemistry      Component Value Date/Time   NA 142 02/25/2014 1107   NA 137 07/17/2013 0255   NA 136 03/13/2010 1158   K 3.9 02/25/2014 1107   K 3.2* 07/17/2013 0255   K 3.9 03/13/2010 1158   CL 100 07/17/2013 0255   CL 102 01/25/2013 0909   CL 101 03/13/2010 1158   CO2 30* 02/25/2014 1107   CO2 27 07/17/2013 0255   CO2 30 03/13/2010 1158   BUN 11.1 02/25/2014 1107   BUN 11 07/17/2013 0255   BUN 14 03/13/2010 1158   CREATININE 0.8 02/25/2014 1107   CREATININE 0.73 07/17/2013 0255   CREATININE 0.8 03/13/2010 1158      Component Value Date/Time   CALCIUM 9.7 02/25/2014 1107   CALCIUM 9.7 07/17/2013 0255   CALCIUM 9.7 03/13/2010 1158   ALKPHOS 65 02/25/2014 1107   ALKPHOS 87 06/03/2012 2045   ALKPHOS 65 03/13/2010 1158   AST 19 02/25/2014 1107   AST 21 06/03/2012 2045   AST 31 03/13/2010 1158   ALT 18 02/25/2014  1107   ALT 14 06/03/2012 2045   ALT 32 03/13/2010 1158   BILITOT 0.58 02/25/2014 1107   BILITOT 0.2* 06/03/2012 2045   BILITOT 0.50 03/13/2010 1158       RADIOGRAPHIC STUDIES:  Dg Chest 2 View  06/03/2012  *RADIOLOGY REPORT*  Clinical Data: Fall, chest pain.  CHEST - 2 VIEW  Comparison: 10/04/2011  Findings: Cardiomediastinal contours unchanged.  Mild aortic arch  atherosclerosis.  Lungs remain predominately clear.  Mild mid thoracic degenerative changes.  No acute osseous finding.  IMPRESSION: No radiographic evidence of acute cardiopulmonary process.   Original Report Authenticated By: Suanne Marker, M.D.    Dg Cervical Spine Complete  06/03/2012  *RADIOLOGY REPORT*  Clinical Data: Fall, neck discomfort.  CERVICAL SPINE - COMPLETE 4+ VIEW  Comparison: 12/04/2007 CT  Findings: Degenerative disc disease, most pronounced at C4-5 and C5- 6. Minimal anterolisthesis of C3 and C4 and minimal retrolisthesis of C5 on C6.  Otherwise, maintained vertebral body height and alignment. No acute fracture or dislocation is identified.  Absent dentition.  Clear apices.  Maintained C1-2 articulation.  No dens fracture. Paravertebral soft tissues within normal limits.  IMPRESSION: Degenerative changes of the mid cervical spine as above.  No acute osseous abnormality identified.   Original Report Authenticated By: Suanne Marker, M.D.    Dg Thoracic Spine 2 View  06/03/2012  *RADIOLOGY REPORT*  Clinical Data: Fall with mid back pain.  THORACIC SPINE - 2 VIEW  Comparison: 10/04/2011 and prior chest radiographs  Findings: A mild thoracic scoliosis is identified. There is no evidence of fracture or subluxation. Mild multilevel degenerative disc disease noted. No focal bony lesions are present.  IMPRESSION: No evidence of acute abnormality.  Unchanged mild scoliosis.   Original Report Authenticated By: Lura Em, M.D.    Dg Hip Complete Right  06/03/2012  *RADIOLOGY REPORT*  Clinical Data: Fall with right hip  pain.  RIGHT HIP - COMPLETE 2+ VIEW  Comparison: None  Findings: There is no evidence of acute fracture, subluxation or dislocation. There is joint space is unremarkable. No focal bony lesions are present. Mild degenerative changes of the lower lumbar spine noted.  IMPRESSION: No evidence of acute abnormality.   Original Report Authenticated By: Lura Em, M.D.    Ct Head Wo Contrast  06/04/2012  *RADIOLOGY REPORT*  Clinical Data: 63 year old female with loss of consciousness. History of breast cancer.  CT HEAD WITHOUT CONTRAST  Technique:  Contiguous axial images were obtained from the base of the skull through the vertex without contrast.  Comparison: 12/04/2007 CT  Findings: No intracranial abnormalities are identified, including mass lesion or mass effect, hydrocephalus, extra-axial fluid collection, midline shift, hemorrhage, or acute infarction.  The visualized bony calvarium is unremarkable.  IMPRESSION: Unremarkable noncontrast head CT.   Original Report Authenticated By: Lura Em, M.D.     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 She underwent lumpectomy followed by radiation and then began aromatase inhibitors but she could not tolerate and that was changed to tamoxifen 20 mg daily which she took for about 2 years and then discontinued due to side effects.  #2 patient will like to go back on tamoxifen 20 mg daily. This was restarted in November 2014. Plan would be to give her 5 years of tamoxifen.  #3 hot flashes  #4 vaginal dryness  PLAN: #1 continue tamoxifen 20 mg daily. I have requested her annual mammogram to be done in July 2015.  #2 we discussed different options for treatment of her hot flashes. She does not want any prescription medication such as Effexor or gabapentin. For now, we will try natural vitamin D 400 units daily. We will readdress this with her in the  future.  #3 she may use Astro glide for vaginal dryness.  #4 we discussed her feelings of  sadness and tearfulness with regards to her breast cancer. I have validated her feelings. We discussed attending support groups which she is already active in. I have also offered her counseling, but she has declined this at this time. Additionally, she does not want any antidepressant.  #5 patient will be seen back in 6 months time for followup.  All questions were answered. The patient knows to call the clinic with any problems, questions or concerns. We can certainly see the patient much sooner if necessary.  I spent 25 minutes counseling the patient face to face. The total time spent in the appointment was 30 minutes.    Mikey Bussing, DNP, AGPCNP-BC  02/25/2014, 3:10 PM

## 2014-04-08 ENCOUNTER — Ambulatory Visit
Admission: RE | Admit: 2014-04-08 | Discharge: 2014-04-08 | Disposition: A | Discharge status: Home / Self Care | Payer: Commercial Managed Care - HMO | Source: Ambulatory Visit | Attending: Oncology | Admitting: Oncology

## 2014-04-08 DIAGNOSIS — C50919 Malignant neoplasm of unspecified site of unspecified female breast: Secondary | ICD-10-CM

## 2014-08-26 ENCOUNTER — Other Ambulatory Visit (HOSPITAL_BASED_OUTPATIENT_CLINIC_OR_DEPARTMENT_OTHER): Payer: Commercial Managed Care - HMO

## 2014-08-26 ENCOUNTER — Ambulatory Visit (HOSPITAL_BASED_OUTPATIENT_CLINIC_OR_DEPARTMENT_OTHER): Payer: Commercial Managed Care - HMO | Admitting: Oncology

## 2014-08-26 ENCOUNTER — Other Ambulatory Visit: Payer: Self-pay | Admitting: *Deleted

## 2014-08-26 ENCOUNTER — Telehealth: Payer: Self-pay | Admitting: Nurse Practitioner

## 2014-08-26 VITALS — BP 126/68 | HR 77 | Temp 98.4°F | Resp 18 | Ht 68.0 in | Wt 200.1 lb

## 2014-08-26 DIAGNOSIS — C50919 Malignant neoplasm of unspecified site of unspecified female breast: Secondary | ICD-10-CM

## 2014-08-26 DIAGNOSIS — C50912 Malignant neoplasm of unspecified site of left female breast: Secondary | ICD-10-CM

## 2014-08-26 DIAGNOSIS — Z170 Estrogen receptor positive status [ER+]: Secondary | ICD-10-CM

## 2014-08-26 DIAGNOSIS — C50411 Malignant neoplasm of upper-outer quadrant of right female breast: Secondary | ICD-10-CM

## 2014-08-26 LAB — CBC WITH DIFFERENTIAL/PLATELET
BASO%: 0.2 % (ref 0.0–2.0)
Basophils Absolute: 0 10*3/uL (ref 0.0–0.1)
EOS%: 3.5 % (ref 0.0–7.0)
Eosinophils Absolute: 0.2 10*3/uL (ref 0.0–0.5)
HCT: 40.6 % (ref 34.8–46.6)
HGB: 13.3 g/dL (ref 11.6–15.9)
LYMPH%: 49.6 % (ref 14.0–49.7)
MCH: 30.5 pg (ref 25.1–34.0)
MCHC: 32.8 g/dL (ref 31.5–36.0)
MCV: 93.1 fL (ref 79.5–101.0)
MONO#: 0.5 10*3/uL (ref 0.1–0.9)
MONO%: 10.8 % (ref 0.0–14.0)
NEUT#: 1.8 10*3/uL (ref 1.5–6.5)
NEUT%: 35.9 % — ABNORMAL LOW (ref 38.4–76.8)
Platelets: 191 10*3/uL (ref 145–400)
RBC: 4.36 10*6/uL (ref 3.70–5.45)
RDW: 13.1 % (ref 11.2–14.5)
WBC: 4.9 10*3/uL (ref 3.9–10.3)
lymph#: 2.4 10*3/uL (ref 0.9–3.3)

## 2014-08-26 LAB — COMPREHENSIVE METABOLIC PANEL (CC13)
ALT: 24 U/L (ref 0–55)
AST: 22 U/L (ref 5–34)
Albumin: 3.4 g/dL — ABNORMAL LOW (ref 3.5–5.0)
Alkaline Phosphatase: 74 U/L (ref 40–150)
Anion Gap: 11 mEq/L (ref 3–11)
BUN: 12.5 mg/dL (ref 7.0–26.0)
CO2: 26 mEq/L (ref 22–29)
Calcium: 9.7 mg/dL (ref 8.4–10.4)
Chloride: 104 mEq/L (ref 98–109)
Creatinine: 0.8 mg/dL (ref 0.6–1.1)
EGFR: 85 mL/min/{1.73_m2} — ABNORMAL LOW (ref >90)
Glucose: 89 mg/dl (ref 70–140)
Potassium: 3.9 mEq/L (ref 3.5–5.1)
Sodium: 142 mEq/L (ref 136–145)
Total Bilirubin: 0.53 mg/dL (ref 0.20–1.20)
Total Protein: 7 g/dL (ref 6.4–8.3)

## 2014-08-26 MED ORDER — TAMOXIFEN CITRATE 20 MG PO TABS: 20.0000 mg | ORAL_TABLET | Freq: Every day | ORAL | Status: DC

## 2014-08-26 NOTE — Progress Notes (Signed)
[place]  Telephone:([phone]) [phone] Fax:(336) [phone]     ID: PHILLIP SANDLER DOB: May 28, 1951  MR#: [id number]  NKN#:[id number]  Patient Care Team: Glendale Chard, MD as PCP - General (Internal Medicine) Campbell Lerner, MD as Consulting Physician (Rheumatology) PCP: [name], MD GYN: SU:  OTHER MD: Arloa Koh MD  CHIEF COMPLAINT: Estrogen receptor positive breast cancer  CURRENT TREATMENT: Anastrozole   BREAST CANCER HISTORY: From Dr. Dana Allan earlier summary:  "#1 patient is status post lumpectomy with sentinel lymph node biopsy followed by radiation to the right breast. In December 2009 patient began aromatase inhibitor but could not tolerate it.  #2 therefore she began in March 2010 tamoxifen 20 mg daily. However she has experienced significant amount of aches and pains and in December 2012 discontinued it. At this time she does not want to go back on any kind of antiestrogen therapy due to side effects.  #3 patient would like to go back on tamoxifen 20 mg daily. She recently attended finding your new normal seminar. And because of that she wants to she would like to go back on the tamoxifen. We discussed side effects again. She was sent to her pharmacy."  Her subsequent history is as detailed below  INTERVAL HISTORY: The patient returns today for follow-up of her breast cancer. She is establishing herself on my service today. She continues on anastrozole with good tolerance. She does have hot flashes perhaps 1-4 times a day. They're not a major issue for her. They don't wake her up at night. She also has some mild vaginal "wetness", more a stickiness. Again this is not a major problem for her. She never developed the arthralgias/myalgias that some patients can experience on anastrozole  REVIEW OF SYSTEMS: She has mild insomnia problems, which are chronic. She has pain in her joints and lower back, which is not more intense or persistent than  before. She complains of hearing loss. Her dentures don't fit well. She has mild sinus drainage but no cough or shortness of breath. She has a history of frequent urinary tract infections. She has a history of lupus, which is currently under control. A detailed review of systems today was otherwise stable  PAST MEDICAL HISTORY: Past Medical History  Diagnosis Date  . Hypertension   . Heart murmur     functional murmur, echo in [place]- long time ago   . Lupus   . Blood transfusion     1986- post-partial hysterectomy  . Hepatitis     Hep. A- 1978  . Chronic kidney disease   . GERD (gastroesophageal reflux disease)     recently taken off anti reflux tx  . Arthritis     R knee, hands   . No pertinent past medical history     will see cardiac for review this week, 08/24/2011  . rt breast ca dx'd 12/2007    xrt comp;    PAST SURGICAL HISTORY: Past Surgical History  Procedure Laterality Date  . Joint replacement      L knee replacement- 2006, arthroscopic surg. both knees   . Mastectomy      R breast- lumpectomy -2009  . Tonsillectomy      1954  . Appendectomy      1978 & tubal ligation   . Abdominal hysterectomy      1986  . Tubal ligation    . Breast surgery      R breast lumpectomy  . Cholecystectomy  1978- open   . Total knee arthroplasty  08/30/2011    Procedure: TOTAL KNEE ARTHROPLASTY;  Surgeon: Kerin Salen;  Location: [facility];  Service: Orthopedics;  Laterality: Right;  Right Total Knee Arthroplasty    FAMILY HISTORY Family History  Problem Relation Age of Onset  . Anesthesia problems Neg Hx   . Hypotension Neg Hx   . Malignant hyperthermia Neg Hx   . Pseudochol deficiency Neg Hx   . Heart attack Father   . Cancer Other   . Hypertension Other    the patient's father died from a heart attack at the age of 42. The patient's mother died at the age of 70. She had a history of breast cancer and one of her sisters was diagnosed with breast cancer at the age of 70.  The patient had 5 brothers and 5 sisters. There is no other history of breast or ovarian cancer in the family to her knowledge.  GYNECOLOGIC HISTORY:  No LMP recorded. Patient has had a hysterectomy. Menarche age 1, first live birth age 63. The patient is GX P3. She had a simple hysterectomy in 1986 (no salpingo-oophorectomy.  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 She remarried in 2011. Her husband Laveda Abbe is retired from [employer]. The patient has one adopted son in addition to her own children. 3 of her children live in town 1 in [place]. She has 10 grandchildren. She attends [employer] locally    ADVANCED DIRECTIVES: Not in place   HEALTH MAINTENANCE: History  Substance Use Topics  . Smoking status: Never Smoker   . Smokeless tobacco: Not on file  . Alcohol Use: No     Colonoscopy:  PAP:  Bone density: 04/30/2008 was normal  Lipid panel:  Allergies  Allergen Reactions  . Iodine Anaphylaxis  . Iohexol Anaphylaxis     Code: HIVES, Desc: PT IS ALLERGIC TO IODINATED CONTRAST; REACTION INCLUDES FACIAL SWELLING,HIVES,RESPIRATORY DISTRESS;PT HASN'T HAD SCAN W/PREMEDS.;REFUSED CONTRAST OF ANY KIND!  KR   . Latex Anaphylaxis  . Penicillins Anaphylaxis  . Tetracycline Anaphylaxis  . Milk-Related Compounds     Lactose intolerant    Current Outpatient Prescriptions  Medication Sig Dispense Refill  . Cholecalciferol (VITAMIN D3) 2000 UNITS TABS Take 1 tablet by mouth every morning.     . Ginkgo Biloba 100 MG CAPS Take by mouth.    . hydrochlorothiazide (HYDRODIURIL) 25 MG tablet Take 25 mg by mouth every morning.     Marland Kitchen ibuprofen (ADVIL,MOTRIN) 800 MG tablet     . metoprolol tartrate (LOPRESSOR) 25 MG tablet Take 25 mg by mouth 2 (two) times daily.    Marland Kitchen omeprazole (PRILOSEC) 20 MG capsule Take 1 capsule (20 mg total) by mouth daily. 30 capsule 0  . tamoxifen (NOLVADEX) 20 MG tablet Take 1 tablet  (20 mg total) by mouth daily. 90 tablet 12  . zolpidem (AMBIEN) 5 MG tablet Take 5 mg by mouth at bedtime as needed.     No current facility-administered medications for this visit.    OBJECTIVE: 63 African-American woman who appears stated age 29 Vitals:   08/26/14 0857  BP: 126/68  Pulse: 77  Temp: 98.4 F (36.9 C)  Resp: 18     Body mass index is 30.43 kg/(m^2).    ECOG FS:1 - Symptomatic but completely ambulatory  Ocular: Sclerae unicteric, pupils round and equal Ear-nose-throat: Oropharynx clear and moist  Lymphatic: No cervical or supraclavicular adenopathy Lungs no rales or rhonchi Heart regular rate and rhythm Abd soft, obese, nontender, positive bowel sounds MSK no focal spinal tenderness, no upper extremity lymphedema Neuro: non-focal, well-oriented, appropriate affect Breasts: The right breast is status post lumpectomy and radiation. There is no evidence of local recurrence. The right axilla is benign. The left breast is unremarkable.   LAB RESULTS:  CMP     Component Value Date/Time   NA 142 08/26/2014 0847   NA 137 07/17/2013 0255   NA 136 03/13/2010 1158   K 3.9 08/26/2014 0847   K 3.2* 07/17/2013 0255   K 3.9 03/13/2010 1158   CL 100 07/17/2013 0255   CL 102 01/25/2013 0909   CL 101 03/13/2010 1158   CO2 26 08/26/2014 0847   CO2 27 07/17/2013 0255   CO2 30 03/13/2010 1158   GLUCOSE 89 08/26/2014 0847   GLUCOSE 97 07/17/2013 0255   GLUCOSE 81 01/25/2013 0909   GLUCOSE 77 03/13/2010 1158   BUN 12.5 08/26/2014 0847   BUN 11 07/17/2013 0255   BUN 14 03/13/2010 1158   CREATININE 0.8 08/26/2014 0847   CREATININE 0.73 07/17/2013 0255   CREATININE 0.8 03/13/2010 1158   CALCIUM 9.7 08/26/2014 0847   CALCIUM 9.7 07/17/2013 0255   CALCIUM 9.7 03/13/2010 1158   PROT 7.0 08/26/2014 0847   PROT 6.9 06/03/2012 2045   PROT 7.1 03/13/2010 1158   ALBUMIN 3.4* 08/26/2014 0847   ALBUMIN 3.3* 06/03/2012 2045   AST 22 08/26/2014 0847   AST 21  06/03/2012 2045   AST 31 03/13/2010 1158   ALT 24 08/26/2014 0847   ALT 14 06/03/2012 2045   ALT 32 03/13/2010 1158   ALKPHOS 74 08/26/2014 0847   ALKPHOS 87 06/03/2012 2045   ALKPHOS 65 03/13/2010 1158   BILITOT 0.53 08/26/2014 0847   BILITOT 0.2* 06/03/2012 2045   BILITOT 0.50 03/13/2010 1158   GFRNONAA 88* 07/17/2013 0255   GFRAA >90 07/17/2013 0255    INo results found for: SPEP, UPEP  Lab Results  Component Value Date   WBC 4.9 08/26/2014   NEUTROABS 1.8 08/26/2014   HGB 13.3 08/26/2014   HCT 40.6 08/26/2014   MCV 93.1 08/26/2014   PLT 191 08/26/2014      Chemistry      Component Value Date/Time   NA 142 08/26/2014 0847   NA 137 07/17/2013 0255   NA 136 03/13/2010 1158   K 3.9 08/26/2014 0847   K 3.2* 07/17/2013 0255   K 3.9 03/13/2010 1158   CL 100 07/17/2013 0255   CL 102 01/25/2013 0909   CL 101 03/13/2010 1158   CO2 26 08/26/2014 0847   CO2 27 07/17/2013 0255   CO2 30 03/13/2010 1158   BUN 12.5 08/26/2014 0847   BUN 11 07/17/2013 0255   BUN 14 03/13/2010 1158   CREATININE 0.8 08/26/2014 0847   CREATININE 0.73 07/17/2013 0255   CREATININE 0.8 03/13/2010 1158      Component Value Date/Time   CALCIUM 9.7 08/26/2014 0847   CALCIUM 9.7 07/17/2013 0255   CALCIUM 9.7 03/13/2010 1158   ALKPHOS 74 08/26/2014 0847   ALKPHOS 87 06/03/2012 2045   ALKPHOS 65 03/13/2010 1158   AST 22 08/26/2014 0847   AST 21 06/03/2012 2045   AST 31 03/13/2010 1158   ALT 24 08/26/2014 0847   ALT 14 06/03/2012 2045   ALT 32 03/13/2010 1158   BILITOT 0.53 08/26/2014 0847   BILITOT 0.2*  06/03/2012 2045   BILITOT 0.50 03/13/2010 1158       Lab Results  Component Value Date   LABCA2 16 11/09/2010    No components found for: OEVOJ500  No results for input(s): INR in the last 168 hours.  Urinalysis    Component Value Date/Time   COLORURINE YELLOW 01/12/2013 0153   APPEARANCEUR CLOUDY* 01/12/2013 0153   LABSPEC 1.031* 01/12/2013 0153   LABSPEC 1.015 06/19/2008  1027   PHURINE 6.0 01/12/2013 0153   GLUCOSEU NEGATIVE 01/12/2013 0153   HGBUR NEGATIVE 01/12/2013 0153   HGBUR negative 01/22/2010 1551   BILIRUBINUR NEGATIVE 01/12/2013 0153   KETONESUR NEGATIVE 01/12/2013 0153   PROTEINUR NEGATIVE 01/12/2013 0153   UROBILINOGEN 1.0 01/12/2013 0153   NITRITE NEGATIVE 01/12/2013 0153   LEUKOCYTESUR MODERATE* 01/12/2013 0153    STUDIES: CLINICAL DATA: History of right breast cancer status post lumpectomy July 2009. History of prior benign excisional biopsy of the left breast. Annual exam.  EXAM: DIGITAL DIAGNOSTIC BILATERAL MAMMOGRAM WITH CAD  COMPARISON: With priors  ACR Breast Density Category b: There are scattered areas of fibroglandular density.  FINDINGS: There are stable lumpectomy changes in the superior right breast. Biopsy clip is noted the right lumpectomy site. No mass, nonsurgical distortion, or suspicious microcalcification is identified in either breast to suggest malignancy.  Mammographic images were processed with CAD.  IMPRESSION: No evidence of malignancy in either breast. Lumpectomy changes of the right breast.  RECOMMENDATION: Diagnostic mammogram is suggested in 1 year. (Code:[id number]-B-01Y)  I have discussed the findings and recommendations with the patient. Results were also provided in writing at the conclusion of the visit. If applicable, a reminder letter will be sent to the patient regarding the next appointment.  BI-RADS CATEGORY 2: Benign Finding(s)   Electronically Signed  By: Curlene Dolphin M.D.  On: 04/08/2014 09:48  ASSESSMENT: 63 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  (2) status post right lumpectomy 03/20/2008 for invasive lobular carcinoma, multifocal, pT1a pNX, stage IA, grade 2, strongly estrogen and  progesterone receptor positive, HER-2 negative. with negative margins.  (3) status post right axillary sentinel lymph node sampling 04/17/2008, the single sentinel lymph node being clear  (4) Oncotype score of 10 predicts an outside the breast risk of recurrence of 7% if the patient's only systemic therapy is tamoxifen for 5 years.  (5) Additional right breast biopsy 05/23/2008 to evaluate microcalcifications seen on pre-radiotherapy mammography showed only atypical lobular hyperplasia  (6) Completed radiation to the right breast 07/26/2008 (50 gray +14 gray boost).  (7) Started on anastrozole November 2009, with poor tolerance  (8). Started on tamoxifen March 2010, continued to December 2012, resumed December 2014   (9) status post simple hysterectomy 1986 (no salpingo-oophorectomy)  PLAN: I spent approximately one hour today with Ms. Fager clarifying her situation after digging out the data from the prior records in [place]. In brief: Her initial right breast biopsy showed only noninvasive cancer, but the definitive lumpectomy showed multifocal  invasive lobular breast cancer. No single lesion measured more than 5 mm. The invasive disease was estrogen and progesterone receptor positive. She then had biopsy of a sentinel lymph node which was likewise negative.  We reviewed the fact that T1a tumors have such a low recurrence rate with local treatment only that antiestrogen therapy is optional. From that point of view I would not be uncomfortable discontinuing tamoxifen.  However if she does continue tamoxifen for a total of 5 years she will cut in half the risk of another breast cancer developing in either breast. For that reason I would recommend she continue tamoxifen until December 2017.  She would like to be followed on an every 6 month basis. I am glad to accommodate that. Once we have a survivorship clinic she will be an excellent candidate for it  She tells me she has had BRCA testing  and that she was negative. However I cannot locate that data. I have asked our geneticist to help [name] find it and I will added as an addendum.  In brief, Janae Bridgeman has an excellent chance of this cancer not coming back and she has a good understanding of the overall plan. She agrees with it. She knows the goal of treatment in her case is cure. She will call with any problems that may develop before her next visit here.  Chauncey Cruel, MD   08/26/2014 5:39 PM

## 2014-08-26 NOTE — Telephone Encounter (Signed)
per pof to sch pt appt-gave pt copy of sch °

## 2014-08-27 ENCOUNTER — Other Ambulatory Visit: Payer: Self-pay | Admitting: Oncology

## 2014-08-27 NOTE — Progress Notes (Unsigned)
I verified with Roma Kayser our genetics counselor that Ms. Fahey was previously tested for BRCA mutations and no mutations were found.

## 2014-10-05 ENCOUNTER — Observation Stay (HOSPITAL_COMMUNITY): Payer: Medicare Other

## 2014-10-05 ENCOUNTER — Encounter (HOSPITAL_COMMUNITY): Payer: Self-pay | Admitting: Emergency Medicine

## 2014-10-05 ENCOUNTER — Emergency Department (HOSPITAL_COMMUNITY): Payer: Medicare Other

## 2014-10-05 ENCOUNTER — Observation Stay (HOSPITAL_COMMUNITY)
Admission: EM | Admit: 2014-10-05 | Discharge: 2014-10-07 | Disposition: A | Discharge status: Home / Self Care | Payer: Medicare Other | Attending: Internal Medicine | Admitting: Internal Medicine

## 2014-10-05 DIAGNOSIS — IMO0002 Reserved for concepts with insufficient information to code with codable children: Secondary | ICD-10-CM | POA: Diagnosis present

## 2014-10-05 DIAGNOSIS — Z91040 Latex allergy status: Secondary | ICD-10-CM | POA: Insufficient documentation

## 2014-10-05 DIAGNOSIS — R072 Precordial pain: Secondary | ICD-10-CM

## 2014-10-05 DIAGNOSIS — Z79899 Other long term (current) drug therapy: Secondary | ICD-10-CM | POA: Diagnosis not present

## 2014-10-05 DIAGNOSIS — N189 Chronic kidney disease, unspecified: Secondary | ICD-10-CM | POA: Diagnosis not present

## 2014-10-05 DIAGNOSIS — M329 Systemic lupus erythematosus, unspecified: Secondary | ICD-10-CM | POA: Diagnosis not present

## 2014-10-05 DIAGNOSIS — C50411 Malignant neoplasm of upper-outer quadrant of right female breast: Secondary | ICD-10-CM | POA: Diagnosis present

## 2014-10-05 DIAGNOSIS — R002 Palpitations: Secondary | ICD-10-CM | POA: Diagnosis not present

## 2014-10-05 DIAGNOSIS — Z883 Allergy status to other anti-infective agents status: Secondary | ICD-10-CM | POA: Diagnosis not present

## 2014-10-05 DIAGNOSIS — I129 Hypertensive chronic kidney disease with stage 1 through stage 4 chronic kidney disease, or unspecified chronic kidney disease: Secondary | ICD-10-CM | POA: Insufficient documentation

## 2014-10-05 DIAGNOSIS — Z881 Allergy status to other antibiotic agents status: Secondary | ICD-10-CM | POA: Insufficient documentation

## 2014-10-05 DIAGNOSIS — R091 Pleurisy: Secondary | ICD-10-CM | POA: Diagnosis not present

## 2014-10-05 DIAGNOSIS — Z170 Estrogen receptor positive status [ER+]: Secondary | ICD-10-CM | POA: Diagnosis present

## 2014-10-05 DIAGNOSIS — R079 Chest pain, unspecified: Secondary | ICD-10-CM | POA: Diagnosis present

## 2014-10-05 DIAGNOSIS — K219 Gastro-esophageal reflux disease without esophagitis: Secondary | ICD-10-CM | POA: Diagnosis not present

## 2014-10-05 DIAGNOSIS — R0602 Shortness of breath: Secondary | ICD-10-CM | POA: Insufficient documentation

## 2014-10-05 DIAGNOSIS — Z91011 Allergy to milk products: Secondary | ICD-10-CM | POA: Insufficient documentation

## 2014-10-05 DIAGNOSIS — Z880 Allergy status to penicillin: Secondary | ICD-10-CM | POA: Diagnosis not present

## 2014-10-05 DIAGNOSIS — E876 Hypokalemia: Secondary | ICD-10-CM | POA: Diagnosis not present

## 2014-10-05 DIAGNOSIS — Z853 Personal history of malignant neoplasm of breast: Secondary | ICD-10-CM | POA: Insufficient documentation

## 2014-10-05 DIAGNOSIS — I10 Essential (primary) hypertension: Secondary | ICD-10-CM | POA: Diagnosis present

## 2014-10-05 LAB — BASIC METABOLIC PANEL
Anion gap: 4 — ABNORMAL LOW (ref 5–15)
BUN: 10 mg/dL (ref 6–23)
CO2: 27 mmol/L (ref 19–32)
Calcium: 9.1 mg/dL (ref 8.4–10.5)
Chloride: 108 mEq/L (ref 96–112)
Creatinine, Ser: 0.73 mg/dL (ref 0.50–1.10)
GFR calc Af Amer: >90 mL/min (ref >90)
GFR calc non Af Amer: 88 mL/min — ABNORMAL LOW (ref >90)
Glucose, Bld: 95 mg/dL (ref 70–99)
Potassium: 3.3 mmol/L — ABNORMAL LOW (ref 3.5–5.1)
Sodium: 139 mmol/L (ref 135–145)

## 2014-10-05 LAB — URINALYSIS, ROUTINE W REFLEX MICROSCOPIC
Bilirubin Urine: NEGATIVE
Glucose, UA: NEGATIVE mg/dL
Hgb urine dipstick: NEGATIVE
Ketones, ur: NEGATIVE mg/dL
Leukocytes, UA: NEGATIVE
Nitrite: NEGATIVE
Protein, ur: NEGATIVE mg/dL
Specific Gravity, Urine: 1.016 (ref 1.005–1.030)
Urobilinogen, UA: 0.2 mg/dL (ref 0.0–1.0)
pH: 7 (ref 5.0–8.0)

## 2014-10-05 LAB — APTT: aPTT: 24 seconds (ref 24–37)

## 2014-10-05 LAB — CBC
HCT: 37.5 % (ref 36.0–46.0)
Hemoglobin: 12.5 g/dL (ref 12.0–15.0)
MCH: 30.6 pg (ref 26.0–34.0)
MCHC: 33.3 g/dL (ref 30.0–36.0)
MCV: 91.9 fL (ref 78.0–100.0)
Platelets: 181 10*3/uL (ref 150–400)
RBC: 4.08 MIL/uL (ref 3.87–5.11)
RDW: 13 % (ref 11.5–15.5)
WBC: 4.8 10*3/uL (ref 4.0–10.5)

## 2014-10-05 LAB — D-DIMER, QUANTITATIVE: D-Dimer, Quant: 1.05 ug/mL-FEU — ABNORMAL HIGH (ref 0.00–0.48)

## 2014-10-05 LAB — CK TOTAL AND CKMB (NOT AT ARMC)
CK, MB: 2.1 ng/mL (ref 0.3–4.0)
Relative Index: 2.1 (ref 0.0–2.5)
Total CK: 102 U/L (ref 7–177)

## 2014-10-05 LAB — PROTIME-INR
INR: 1.1 (ref 0.00–1.49)
Prothrombin Time: 14.4 seconds (ref 11.6–15.2)

## 2014-10-05 LAB — I-STAT TROPONIN, ED
Troponin i, poc: 0 ng/mL (ref 0.00–0.08)
Troponin i, poc: 0 ng/mL (ref 0.00–0.08)

## 2014-10-05 LAB — TSH: TSH: 1.288 u[IU]/mL (ref 0.350–4.500)

## 2014-10-05 LAB — BRAIN NATRIURETIC PEPTIDE: B Natriuretic Peptide: 46 pg/mL (ref 0.0–100.0)

## 2014-10-05 MED ORDER — METOPROLOL SUCCINATE ER 50 MG PO TB24
50.0000 mg | ORAL_TABLET | Freq: Every morning | ORAL | Status: DC
  Administered 2014-10-06 – 2014-10-07 (×2): 50 mg via ORAL
  Filled 2014-10-05 (×2): qty 1

## 2014-10-05 MED ORDER — SODIUM CHLORIDE 0.9 % IV SOLN
1000.0000 mL | INTRAVENOUS | Status: DC
Start: 2014-10-05 — End: 2014-10-07
  Administered 2014-10-05: 1000 mL via INTRAVENOUS

## 2014-10-05 MED ORDER — VITAMIN D3 25 MCG (1000 UNIT) PO TABS
2000.0000 [IU] | ORAL_TABLET | Freq: Every morning | ORAL | Status: DC
  Administered 2014-10-06 – 2014-10-07 (×2): 2000 [IU] via ORAL
  Filled 2014-10-05 (×2): qty 2

## 2014-10-05 MED ORDER — SENNOSIDES-DOCUSATE SODIUM 8.6-50 MG PO TABS: 1.0000 | ORAL_TABLET | Freq: Every evening | ORAL | Status: DC | PRN

## 2014-10-05 MED ORDER — ENOXAPARIN SODIUM 100 MG/ML ~~LOC~~ SOLN
90.0000 mg | SUBCUTANEOUS | Status: DC
  Filled 2014-10-05: qty 1

## 2014-10-05 MED ORDER — ASPIRIN 81 MG PO CHEW
324.0000 mg | CHEWABLE_TABLET | Freq: Once | ORAL | Status: AC
  Administered 2014-10-05: 324 mg via ORAL
  Filled 2014-10-05: qty 4

## 2014-10-05 MED ORDER — SODIUM CHLORIDE 0.9 % IJ SOLN
3.0000 mL | Freq: Two times a day (BID) | INTRAMUSCULAR | Status: DC
  Administered 2014-10-05 – 2014-10-06 (×2): 3 mL via INTRAVENOUS

## 2014-10-05 MED ORDER — TECHNETIUM TC 99M DIETHYLENETRIAME-PENTAACETIC ACID: 43.2000 | Freq: Once | INTRAVENOUS | Status: AC | PRN

## 2014-10-05 MED ORDER — ALUM & MAG HYDROXIDE-SIMETH 200-200-20 MG/5ML PO SUSP
30.0000 mL | Freq: Four times a day (QID) | ORAL | Status: DC | PRN
Start: 2014-10-05 — End: 2014-10-07

## 2014-10-05 MED ORDER — ENOXAPARIN SODIUM 100 MG/ML ~~LOC~~ SOLN
90.0000 mg | Freq: Two times a day (BID) | SUBCUTANEOUS | Status: DC
  Filled 2014-10-05: qty 1

## 2014-10-05 MED ORDER — POTASSIUM CHLORIDE IN NACL 20-0.9 MEQ/L-% IV SOLN
INTRAVENOUS | Status: DC
  Administered 2014-10-05: 20:00:00 via INTRAVENOUS
  Filled 2014-10-05 (×2): qty 1000

## 2014-10-05 MED ORDER — ACETAMINOPHEN 325 MG PO TABS: 650.0000 mg | ORAL_TABLET | Freq: Four times a day (QID) | ORAL | Status: DC | PRN

## 2014-10-05 MED ORDER — TECHNETIUM TO 99M ALBUMIN AGGREGATED
4.5000 | Freq: Once | INTRAVENOUS | Status: AC | PRN
  Administered 2014-10-05: 4.5 via INTRAVENOUS

## 2014-10-05 MED ORDER — TAMOXIFEN CITRATE 10 MG PO TABS
20.0000 mg | ORAL_TABLET | Freq: Every day | ORAL | Status: DC
  Administered 2014-10-06 – 2014-10-07 (×2): 20 mg via ORAL
  Filled 2014-10-05 (×3): qty 2

## 2014-10-05 MED ORDER — POTASSIUM CHLORIDE 20 MEQ/15ML (10%) PO SOLN
40.0000 meq | Freq: Once | ORAL | Status: AC
  Administered 2014-10-06: 40 meq via ORAL
  Filled 2014-10-05 (×2): qty 30

## 2014-10-05 MED ORDER — ACETAMINOPHEN 650 MG RE SUPP: 650.0000 mg | Freq: Four times a day (QID) | RECTAL | Status: DC | PRN

## 2014-10-05 MED ORDER — NITROGLYCERIN 0.4 MG SL SUBL: 0.4000 mg | SUBLINGUAL_TABLET | SUBLINGUAL | Status: DC | PRN

## 2014-10-05 MED ORDER — ASPIRIN EC 325 MG PO TBEC
325.0000 mg | DELAYED_RELEASE_TABLET | Freq: Every day | ORAL | Status: DC
  Administered 2014-10-05 – 2014-10-06 (×2): 325 mg via ORAL
  Filled 2014-10-05 (×2): qty 1

## 2014-10-05 MED ORDER — HYDROMORPHONE HCL 1 MG/ML IJ SOLN
1.0000 mg | Freq: Once | INTRAMUSCULAR | Status: AC
  Administered 2014-10-05: 1 mg via INTRAVENOUS
  Filled 2014-10-05: qty 1

## 2014-10-05 MED ORDER — PANTOPRAZOLE SODIUM 40 MG PO TBEC
40.0000 mg | DELAYED_RELEASE_TABLET | Freq: Every day | ORAL | Status: DC
  Administered 2014-10-06 – 2014-10-07 (×2): 40 mg via ORAL
  Filled 2014-10-05 (×2): qty 1

## 2014-10-05 MED ORDER — ZOLPIDEM TARTRATE 5 MG PO TABS: 5.0000 mg | ORAL_TABLET | Freq: Every evening | ORAL | Status: DC | PRN

## 2014-10-05 MED ORDER — HYDROCHLOROTHIAZIDE 25 MG PO TABS
25.0000 mg | ORAL_TABLET | Freq: Every morning | ORAL | Status: DC
  Administered 2014-10-06 – 2014-10-07 (×2): 25 mg via ORAL
  Filled 2014-10-05 (×2): qty 1

## 2014-10-05 MED ORDER — ONDANSETRON HCL 4 MG/2ML IJ SOLN: 4.0000 mg | Freq: Four times a day (QID) | INTRAMUSCULAR | Status: DC | PRN

## 2014-10-05 MED ORDER — MORPHINE SULFATE 2 MG/ML IJ SOLN
2.0000 mg | INTRAMUSCULAR | Status: DC | PRN
  Administered 2014-10-05 – 2014-10-06 (×2): 2 mg via INTRAVENOUS
  Filled 2014-10-05 (×2): qty 1

## 2014-10-05 MED ORDER — HYDROCODONE-ACETAMINOPHEN 5-325 MG PO TABS
1.0000 | ORAL_TABLET | ORAL | Status: DC | PRN
  Administered 2014-10-06 – 2014-10-07 (×3): 2 via ORAL
  Filled 2014-10-05 (×3): qty 2

## 2014-10-05 MED ORDER — ENOXAPARIN SODIUM 40 MG/0.4ML ~~LOC~~ SOLN: 40.0000 mg | SUBCUTANEOUS | Status: DC

## 2014-10-05 MED ORDER — ONDANSETRON HCL 4 MG PO TABS: 4.0000 mg | ORAL_TABLET | Freq: Four times a day (QID) | ORAL | Status: DC | PRN

## 2014-10-05 MED ORDER — NITROGLYCERIN 2 % TD OINT: 1.0000 [in_us] | TOPICAL_OINTMENT | Freq: Once | TRANSDERMAL | Status: DC

## 2014-10-05 MED ORDER — PREDNISONE 20 MG PO TABS
30.0000 mg | ORAL_TABLET | Freq: Every day | ORAL | Status: DC
  Administered 2014-10-06 – 2014-10-07 (×2): 30 mg via ORAL
  Filled 2014-10-05 (×4): qty 1

## 2014-10-05 NOTE — Progress Notes (Signed)
ANTICOAGULATION CONSULT NOTE - Initial Consult  Pharmacy Consult for Lovenox Indication: ACS / NSTEMI / CP  Allergies  Allergen Reactions  . Iodine Anaphylaxis  . Iohexol Anaphylaxis     Code: HIVES, Desc: PT IS ALLERGIC TO IODINATED CONTRAST; REACTION INCLUDES FACIAL SWELLING,HIVES,RESPIRATORY DISTRESS;PT HASN'T HAD SCAN W/PREMEDS.;REFUSED CONTRAST OF ANY KIND!  KR   . Latex Anaphylaxis  . Penicillins Anaphylaxis  . Tetracycline Anaphylaxis  . Milk-Related Compounds     Lactose intolerant    Patient Measurements:   Height: 68 inches Weight: 90.8 kg as of 08/26/14  Vital Signs: Temp: 98.1 F (36.7 C) (01/16 1213) Temp Source: Oral (01/16 1213) BP: 148/82 mmHg (01/16 1424) Pulse Rate: 68 (01/16 1424)  Labs:  Recent Labs  10/05/14 1350 10/05/14 1351  HGB 12.5  --   HCT 37.5  --   PLT 181  --   APTT  --  24  LABPROT  --  14.4  INR  --  1.10  CREATININE 0.73  --     CrCl cannot be calculated (Unknown ideal weight.).   Medical History: Past Medical History  Diagnosis Date  . Hypertension   . Heart murmur     functional murmur, echo in [place]- long time ago   . Lupus   . Blood transfusion     1986- post-partial hysterectomy  . Hepatitis     Hep. A- 1978  . Chronic kidney disease   . GERD (gastroesophageal reflux disease)     recently taken off anti reflux tx  . Arthritis     R knee, hands   . No pertinent past medical history     will see cardiac for review this week, 08/24/2011  . rt breast ca dx'd 12/2007    xrt comp;    Medications:  Scheduled:   Infusions:  . sodium chloride 1,000 mL (10/05/14 1520)   PRN:   Assessment: 64 yo female with lupus presents with to ED with chest pain and palpitations.  Troponins negative x 2, but d-dimer elevated (1.05) and patient has a history of DVT so VQ scan ordered to r/o PE.  Goal of Therapy:  Anti-Xa level 0.6-1 units/ml 4hrs after LMWH dose given Monitor platelets by anticoagulation protocol: Yes   Plan:    Lovenox 90mg  (1 mg/kg0 sq q12h  Follow renal function and adjust interval as needed  Follow CBC and monitor for signs/symptoms of bleeding  Peggyann Juba, PharmD, BCPS Pager: [phone] 10/05/2014,4:56 PM

## 2014-10-05 NOTE — H&P (Signed)
Patient Demographics  [name], is a 64 y.o. 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 However she believes that his symptoms are related to flareup of lupus. She tells me that her doctor checked some blood work recently and suggested that the lupus was flaring. She also believes that lifting heavy objects recently worsened the pain. There was concern for PE in the ED as d-dimer was elevated prompting VQ scan which showed low probability for PE. Troponin is negative, EKG unremarkable, chest x-ray unrevealing and white count normal. Patient is reluctant to take steroids as she is concerned that this may influence her breast cancer evolution. Will admit patient for rule out MI protocol including serial cardiac enzymes and obtain 2-D echocardiogram in a.m. Also check TSH/sedimentation rate. Patient says she had normal stress test last year. Patient is agreeable to trying low-dose prednisone while cardiac workup is pursued. She will receive aspirin/NTG as needed/oxygen/morphine as needed. Will defer long-term management of low past to patient's rheumatologist. Plan   Chest pain  Admit to telemetry  Serial cardiac enzymes/TSH/lipid panel/2-D echocardiogram.  Aspirin/NTG/morphine as needed/oxygen as needed/central fluids LUPUS with possibility of flare up  Check sedimentation rate  Prednisone  Defer long-term management to her a rheumatologist Malignant neoplasm of female breast/HYPERTENSION, BENIGN ESSENTIAL/GERD  No acute changes  Resume home medications  Hypokalemia  Replenish potassium  DVT Prophylaxis   Lovenox     SCDs   AM Labs Ordered, also please review Full Orders  Family Communication: Admission, patients condition and plan of care including tests being ordered have been discussed with the patient and daughter who indicate understanding and agree with the plan and Code Status.  Code Status Full Code  Likely DC to  Home  Condition GUARDED    Time spent in minutes : 55 minutes.       With History of -  Past Medical History  Diagnosis Date  . Hypertension   . Heart murmur     functional murmur, echo in [place]- long time ago   . Lupus   . Blood transfusion     1986- post-partial hysterectomy  . Hepatitis     Hep. A- 1978  . Chronic kidney disease   . GERD (gastroesophageal reflux disease)     recently taken off anti reflux tx  . Arthritis     R knee, hands   . No pertinent past medical history     will see cardiac for review this week, 08/24/2011  . rt breast ca dx'd 12/2007    xrt comp;      Past Surgical History  Procedure Laterality Date  . Joint replacement      L knee replacement- 2006, arthroscopic surg. both knees   . Mastectomy      R breast- lumpectomy -2009  . Tonsillectomy      1954  . Appendectomy      1978 & tubal ligation   . Abdominal hysterectomy      1986  . Tubal ligation    . Breast surgery      R breast lumpectomy  . Cholecystectomy      1978- open   .  Total knee arthroplasty  08/30/2011    Procedure: TOTAL KNEE ARTHROPLASTY;  Surgeon: Kerin Salen;  Location: [facility];  Service: Orthopedics;  Laterality: Right;  Right Total Knee Arthroplasty    in for   Chief Complaint  Patient presents with  . Palpitations  . Shortness of Breath     HPI [name]  is a 64 y.o. female, who presents to the emergency room with complaints of pressure like chest pain and palpitations. She associates her symptoms with lupus flareup and lifting heavy objects and of December when she moved. She denies any fever cough or shortness of breath. She describes  the chest pain as pressure and radiating to the left shoulder. She suggests this is typical of her lupus flareups. Shifting positions in bed makes the pain better. No exacerbating factors.  Review of Systems    In addition to the HPI above, No Fever-chills, No Headache, No changes with Vision or hearing, No problems swallowing food or Liquids, No Cough or Shortness of Breath, No Abdominal pain, No Nausea or Vommitting, Bowel movements are regular, No Blood in stool or Urine, No dysuria, No new skin rashes or bruises, Some joints pains-aches,  No new weakness, tingling, numbness in any extremity, No recent weight gain or loss, No polyuria, polydypsia or polyphagia, No significant Mental Stressors.  A full 10 point Review of Systems was done, except as stated above, all other Review of Systems were negative.   Social History History  Substance Use Topics  . Smoking status: Never Smoker   . Smokeless tobacco: Not on file  . Alcohol Use: No    Family History Family History  Problem Relation Age of Onset  . Anesthesia problems Neg Hx   . Hypotension Neg Hx   . Malignant hyperthermia Neg Hx   . Pseudochol deficiency Neg Hx   . Heart attack Father   . Cancer Other   . Hypertension Other     Prior to Admission medications   Medication Sig Start Date End Date Taking? Authorizing Provider  Cholecalciferol (VITAMIN D3) 2000 UNITS TABS Take 1 tablet by mouth every morning.    Yes Historical Provider, MD  hydrochlorothiazide (HYDRODIURIL) 25 MG tablet Take 25 mg by mouth every morning.    Yes Historical Provider, MD  ibuprofen (ADVIL,MOTRIN) 800 MG tablet Take 800 mg by mouth every 8 (eight) hours as needed for moderate pain.  06/26/13  Yes Historical Provider, MD  metoprolol succinate (TOPROL-XL) 25 MG 24 hr tablet Take 50 mg by mouth every morning.   Yes Historical Provider, MD  metoprolol tartrate (LOPRESSOR) 25 MG tablet Take 25 mg by mouth every morning.    Yes Historical  Provider, MD  omeprazole (PRILOSEC) 20 MG capsule Take 1 capsule (20 mg total) by mouth daily. Patient taking differently: Take 20 mg by mouth every evening.  07/17/13  Yes Hoy Morn, MD  tamoxifen (NOLVADEX) 20 MG tablet Take 1 tablet (20 mg total) by mouth daily. 08/26/14  Yes Chauncey Cruel, MD  Ginkgo Biloba 100 MG CAPS Take by mouth.    Historical Provider, MD  zolpidem (AMBIEN) 5 MG tablet Take 5 mg by mouth at bedtime as needed.    Historical Provider, MD    Allergies  Allergen Reactions  . Iodine Anaphylaxis  . Iohexol Anaphylaxis     Code: HIVES, Desc: PT IS ALLERGIC TO IODINATED CONTRAST; REACTION INCLUDES FACIAL SWELLING,HIVES,RESPIRATORY DISTRESS;PT HASN'T HAD SCAN W/PREMEDS.;REFUSED CONTRAST OF ANY KIND!  KR   .  Latex Anaphylaxis  . Penicillins Anaphylaxis  . Tetracycline Anaphylaxis  . Milk-Related Compounds     Lactose intolerant    Physical Exam  Vitals  Blood pressure 118/69, pulse 61, temperature 98.7 F (37.1 C), temperature source Oral, resp. rate 16, height 5\' 8"  (1.727 m), weight 90.719 kg (200 lb), SpO2 100 %.   1. General  lying in bed in NAD,    2. Normal affect and insight, Not Suicidal or Homicidal, Awake Alert, Oriented X 3.  3. No F.N deficits, ALL C.Nerves Intact, Strength 5/5 all 4 extremities, Sensation intact all 4 extremities, Plantars down going.  4. Ears and Eyes appear Normal, Conjunctivae clear, PERRLA. Moist Oral Mucosa.  5. Supple Neck, No JVD, No cervical lymphadenopathy appriciated, No Carotid Bruits.  6. Symmetrical Chest wall movement, Good air movement bilaterally, CTAB.  7. RRR, No Gallops, Rubs or Murmurs, No Parasternal Heave.  8. Positive Bowel Sounds, Abdomen Soft, Non tender, No organomegaly appriciated,No rebound -guarding or rigidity.  9.  No Cyanosis, Normal Skin Turgor, No Skin Rash or Bruise.  10. Good muscle tone,  joints appear normal , no effusions, Normal ROM.  11. No Palpable Lymph Nodes in Neck or  Axillae  Data Review  CBC  Recent Labs Lab 10/05/14 1350  WBC 4.8  HGB 12.5  HCT 37.5  PLT 181  MCV 91.9  MCH 30.6  MCHC 33.3  RDW 13.0   ------------------------------------------------------------------------------------------------------------------  Chemistries   Recent Labs Lab 10/05/14 1350  NA 139  K 3.3*  CL 108  CO2 27  GLUCOSE 95  BUN 10  CREATININE 0.73  CALCIUM 9.1   ------------------------------------------------------------------------------------------------------------------ estimated creatinine clearance is 82.6 mL/min (by C-G formula based on Cr of 0.73). ------------------------------------------------------------------------------------------------------------------ No results for input(s): TSH, T4TOTAL, T3FREE, THYROIDAB in the last 72 hours.  Invalid input(s): FREET3   Coagulation profile  Recent Labs Lab 10/05/14 1351  INR 1.10   -------------------------------------------------------------------------------------------------------------------  Recent Labs  10/05/14 1351  DDIMER 1.05*   -------------------------------------------------------------------------------------------------------------------  Cardiac Enzymes No results for input(s): CKMB, TROPONINI, MYOGLOBIN in the last 168 hours.  Invalid input(s): CK ------------------------------------------------------------------------------------------------------------------ Invalid input(s): POCBNP   ---------------------------------------------------------------------------------------------------------------  Urinalysis    Component Value Date/Time   COLORURINE YELLOW 10/05/2014 1412   APPEARANCEUR CLEAR 10/05/2014 1412   LABSPEC 1.016 10/05/2014 1412   LABSPEC 1.015 06/19/2008 1027   PHURINE 7.0 10/05/2014 1412   GLUCOSEU NEGATIVE 10/05/2014 1412   HGBUR NEGATIVE 10/05/2014 1412   HGBUR negative 01/22/2010 [place] 10/05/2014 1412    KETONESUR NEGATIVE 10/05/2014 1412   PROTEINUR NEGATIVE 10/05/2014 1412   UROBILINOGEN 0.2 10/05/2014 1412   NITRITE NEGATIVE 10/05/2014 1412   LEUKOCYTESUR NEGATIVE 10/05/2014 1412    ----------------------------------------------------------------------------------------------------------------  Imaging results:   Nm Pulmonary Perf And Vent  10/05/2014   CLINICAL DATA:  Chest pain, shortness of Breath  EXAM: NUCLEAR MEDICINE VENTILATION - PERFUSION LUNG SCAN  TECHNIQUE: Ventilation images were obtained in multiple projections using inhaled aerosol technetium 99 M DTPA. Perfusion images were obtained in multiple projections after intravenous injection of Tc-40m MAA.  RADIOPHARMACEUTICALS:  5.0 mCi Tc-67m DTPA aerosol and 43.2 mCi Tc-24m MAA  COMPARISON:  10/05/2014  FINDINGS: Ventilation: No focal ventilation defect.  Perfusion: No wedge shaped peripheral perfusion defects to suggest acute pulmonary embolism.  IMPRESSION: No evidence for pulmonary embolus.   Electronically Signed   By: Rolm Baptise M.D.   On: 10/05/2014 19:45   Dg Chest Port 1 View  10/05/2014   CLINICAL DATA:  Body aches,  palpitations, heart skipping beats, symptoms for 1 week worsened in last 2 days, history hypertension, lupus, RIGHT breast cancer  EXAM: PORTABLE CHEST - 1 VIEW  COMPARISON:  Portable exam 1223 hr compared to 07/17/2013  FINDINGS: Normal heart size, mediastinal contours and pulmonary vascularity.  Lungs clear.  No pleural effusion or pneumothorax.  No acute osseous findings.  IMPRESSION: No acute abnormalities.   Electronically Signed   By: Lavonia Dana M.D.   On: 10/05/2014 13:18        [name] M.D on 10/05/2014 at 8:13 PM  Between 7am to 7pm - Pager - [phone]  After 7pm go to [email or web address] - password [email or web address]  And look for the night coverage person covering me after hours  [facility] Office  [phone]

## 2014-10-05 NOTE — ED Notes (Addendum)
Pt from home c/o shortness breath, shortness of breath,left chest pain, left arm pain, and nausea since yesterday. Hx of Lupus.

## 2014-10-05 NOTE — ED Provider Notes (Signed)
CSN: [id number]     Arrival date & time 10/05/14  1204 History   First MD Initiated Contact with Patient 10/05/14 1329     Chief Complaint  Patient presents with  . Palpitations  . Shortness of Breath   HPI Patient presents to the emergency room with complaints of chest pain and palpitations. Patient has a history of lupus. Over the last few days she has felt symptoms that she associates with lupus flare. She's had pains in her joints and extremities. She's also had some back discomfort. She has had intermittent episodes lasting maybe 15 minutes at a time where she is having sharp pain in her left chest associated with her heart racing. This has occurred maybe 4 times over the last few days. Today the pain also was in her left arm and she felt nauseated. Right now the symptoms have resolved other than she continues to have pain in her joints and extremities. She also has noticed some urinary urgency but no fevers or pain with urination. History of DVT in the past. No history of heart disease. Past Medical History  Diagnosis Date  . Hypertension   . Heart murmur     functional murmur, echo in [place]- long time ago   . Lupus   . Blood transfusion     1986- post-partial hysterectomy  . Hepatitis     Hep. A- 1978  . Chronic kidney disease   . GERD (gastroesophageal reflux disease)     recently taken off anti reflux tx  . Arthritis     R knee, hands   . No pertinent past medical history     will see cardiac for review this week, 08/24/2011  . rt breast ca dx'd 12/2007    xrt comp;   Past Surgical History  Procedure Laterality Date  . Joint replacement      L knee replacement- 2006, arthroscopic surg. both knees   . Mastectomy      R breast- lumpectomy -2009  . Tonsillectomy      1954  . Appendectomy      1978 & tubal ligation   . Abdominal hysterectomy      1986  . Tubal ligation    . Breast surgery      R breast lumpectomy  . Cholecystectomy      1978- open   . Total knee  arthroplasty  08/30/2011    Procedure: TOTAL KNEE ARTHROPLASTY;  Surgeon: Kerin Salen;  Location: [facility];  Service: Orthopedics;  Laterality: Right;  Right Total Knee Arthroplasty   Family History  Problem Relation Age of Onset  . Anesthesia problems Neg Hx   . Hypotension Neg Hx   . Malignant hyperthermia Neg Hx   . Pseudochol deficiency Neg Hx   . Heart attack Father   . Cancer Other   . Hypertension Other    History  Substance Use Topics  . Smoking status: Never Smoker   . Smokeless tobacco: Not on file  . Alcohol Use: No   OB History    No data available     Review of Systems  All other systems reviewed and are negative.     Allergies  Iodine; Iohexol; Latex; Penicillins; Tetracycline; and Milk-related compounds  Home Medications   Prior to Admission medications   Medication Sig Start Date End Date Taking? Authorizing Provider  Cholecalciferol (VITAMIN D3) 2000 UNITS TABS Take 1 tablet by mouth every morning.    Yes Historical Provider, MD  hydrochlorothiazide (HYDRODIURIL)  25 MG tablet Take 25 mg by mouth every morning.    Yes Historical Provider, MD  ibuprofen (ADVIL,MOTRIN) 800 MG tablet Take 800 mg by mouth every 8 (eight) hours as needed for moderate pain.  06/26/13  Yes Historical Provider, MD  metoprolol succinate (TOPROL-XL) 25 MG 24 hr tablet Take 50 mg by mouth every morning.   Yes Historical Provider, MD  metoprolol tartrate (LOPRESSOR) 25 MG tablet Take 25 mg by mouth every morning.    Yes Historical Provider, MD  omeprazole (PRILOSEC) 20 MG capsule Take 1 capsule (20 mg total) by mouth daily. Patient taking differently: Take 20 mg by mouth every evening.  07/17/13  Yes Hoy Morn, MD  tamoxifen (NOLVADEX) 20 MG tablet Take 1 tablet (20 mg total) by mouth daily. 08/26/14  Yes Chauncey Cruel, MD  Ginkgo Biloba 100 MG CAPS Take by mouth.    Historical Provider, MD  zolpidem (AMBIEN) 5 MG tablet Take 5 mg by mouth at bedtime as needed.    Historical  Provider, MD   BP 148/82 mmHg  Pulse 68  Temp(Src) 98.1 F (36.7 C) (Oral)  Resp 20  SpO2 100% Physical Exam  Constitutional: She appears well-developed and well-nourished. No distress.  HENT:  Head: Normocephalic and atraumatic.  Right Ear: External ear normal.  Left Ear: External ear normal.  Eyes: Conjunctivae are normal. Right eye exhibits no discharge. Left eye exhibits no discharge. No scleral icterus.  Neck: Neck supple. No tracheal deviation present.  Cardiovascular: Normal rate, regular rhythm and intact distal pulses.   Pulmonary/Chest: Effort normal and breath sounds normal. No stridor. No respiratory distress. She has no wheezes. She has no rales. She exhibits tenderness.  Abdominal: Soft. Bowel sounds are normal. She exhibits no distension. There is no tenderness. There is no rebound and no guarding.  Musculoskeletal: She exhibits no edema or tenderness.  Neurological: She is alert. She has normal strength. No cranial nerve deficit (no facial droop, extraocular movements intact, no slurred speech) or sensory deficit. She exhibits normal muscle tone. She displays no seizure activity. Coordination normal.  Skin: Skin is warm and dry. No rash noted.  Psychiatric: She has a normal mood and affect.  Nursing note and vitals reviewed.   ED Course  Procedures (including critical care time) Labs Review Labs Reviewed  BASIC METABOLIC PANEL - Abnormal; Notable for the following:    Potassium 3.3 (*)    GFR calc non Af Amer 88 (*)    Anion gap 4 (*)    All other components within normal limits  D-DIMER, QUANTITATIVE - Abnormal; Notable for the following:    D-Dimer, Quant 1.05 (*)    All other components within normal limits  CBC  APTT  PROTIME-INR  URINALYSIS, ROUTINE W REFLEX MICROSCOPIC  BRAIN NATRIURETIC PEPTIDE  I-STAT TROPOININ, ED  I-STAT TROPOININ, ED    Imaging Review Dg Chest Port 1 View  10/05/2014   CLINICAL DATA:  Body aches, palpitations, heart skipping  beats, symptoms for 1 week worsened in last 2 days, history hypertension, lupus, RIGHT breast cancer  EXAM: PORTABLE CHEST - 1 VIEW  COMPARISON:  Portable exam 1223 hr compared to 07/17/2013  FINDINGS: Normal heart size, mediastinal contours and pulmonary vascularity.  Lungs clear.  No pleural effusion or pneumothorax.  No acute osseous findings.  IMPRESSION: No acute abnormalities.   Electronically Signed   By: Lavonia Dana M.D.   On: 10/05/2014 13:18     EKG Interpretation   Date/Time:  Saturday  October 05 2014 14:08:30 EST Ventricular Rate:  66 PR Interval:  181 QRS Duration: 83 QT Interval:  427 QTC Calculation: 447 R Axis:   51 Text Interpretation:  Sinus rhythm No significant change since last  tracing Confirmed by Loveta Dellis  MD-J, Ellieanna Funderburg ([id number]) on 10/05/2014 2:15:31 PM     Medications  0.9 %  sodium chloride infusion (1,000 mLs Intravenous New Bag/Given 10/05/14 1520)  aspirin chewable tablet 324 mg (324 mg Oral Given 10/05/14 1423)  HYDROmorphone (DILAUDID) injection 1 mg (1 mg Intravenous Given 10/05/14 1521)    MDM   Final diagnoses:  Chest pain   Pt with intermittent chest tightness.  Initial enzymes are negative but d dimer is positive.  CT angio ordered.  Heart score 4.  Will consult with hospitalist for admission, further evaluation.    Dorie Rank, MD 10/05/14 [phone]

## 2014-10-06 DIAGNOSIS — I10 Essential (primary) hypertension: Secondary | ICD-10-CM | POA: Diagnosis not present

## 2014-10-06 DIAGNOSIS — R072 Precordial pain: Secondary | ICD-10-CM | POA: Diagnosis not present

## 2014-10-06 DIAGNOSIS — M329 Systemic lupus erythematosus, unspecified: Secondary | ICD-10-CM

## 2014-10-06 DIAGNOSIS — R079 Chest pain, unspecified: Secondary | ICD-10-CM | POA: Diagnosis not present

## 2014-10-06 DIAGNOSIS — E876 Hypokalemia: Secondary | ICD-10-CM | POA: Diagnosis not present

## 2014-10-06 LAB — HEMOGLOBIN A1C
Hgb A1c MFr Bld: 5.5 % (ref <5.7)
Mean Plasma Glucose: 111 mg/dL (ref <117)

## 2014-10-06 LAB — SEDIMENTATION RATE: Sed Rate: 12 mm/hr (ref 0–22)

## 2014-10-06 LAB — TROPONIN I: Troponin I: <0.03 ng/mL (ref <0.031)

## 2014-10-06 NOTE — Progress Notes (Signed)
[facility] PROGRESS NOTE  [name] PTW:[id number] DOB: 1951-07-10 DOA: 10/05/2014 PCP: [name], MD  Assessment/Plan: 1. Chest pain. -Patient presenting with chest pain having atypical features. Chest x-ray showed no acute abnormalities, VQ scan was negative, troponins negative, EKG did not reveal acute ischemic changes. 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  Suspected lupus flare-up -Patient presenting with complaints of bilateral joint pain and chest pain.  -We'll check sedimentation rate -Meanwhile continue oral steroid therapy  3.  Hypertension -Blood pressures controlled -Continue metoprolol 50 mg by mouth daily  4.  Hypokalemia -Initial labs showing potassium of 3.3 which was replaced with oral potassium supplementation -Repeat BMP in a.m.  Code Status: Full code Family Communication:  Disposition Plan: Anticipate discharge in the next 24-48 hours if remains stable   HPI/Subjective: Patient is a pleasant 64 year old female with a past medical history of lupus presented to the emergency department at [facility] on 10/05/2014 with complaints of bilateral joint pain and retrosternal chest pain having atypical features. She was initially worked up with a chest x-ray which showed no acute abnormalities. A VQ scan showed no evidence of pulmonary embolism. Symptoms felt to be secondary to lupus flareup as she was started on oral steroids. Troponins were cycled and remained negative.  Objective: Filed Vitals:   10/06/14 0700  BP: 144/89  Pulse: 64  Temp: 98 F (36.7 C)  Resp: 20    Intake/Output Summary (Last 24 hours) at 10/06/14 1356 Last data filed at 10/06/14 0900  Gross per 24 hour  Intake 719.17 ml  Output    400 ml  Net 319.17 ml   Filed Weights   10/05/14 1713 10/05/14 1732  Weight: 90.719 kg (200  lb) 90.719 kg (200 lb)    Exam:   General:  Patient states feeling little better although reports having ongoing joint pain and some chest pain  Cardiovascular: Regular rate and rhythm normal S1-S2 no murmurs rubs or gallops  Respiratory: Normal respiratory effort, lungs are clear to auscultation bilaterally  Abdomen: Soft nontender nondistended positive bowel sounds  Musculoskeletal: No edema  Data Reviewed: Basic Metabolic Panel:  Recent Labs Lab 10/05/14 1350  NA 139  K 3.3*  CL 108  CO2 27  GLUCOSE 95  BUN 10  CREATININE 0.73  CALCIUM 9.1   Liver Function Tests: No results for input(s): AST, ALT, ALKPHOS, BILITOT, PROT, ALBUMIN in the last 168 hours. No results for input(s): LIPASE, AMYLASE in the last 168 hours. No results for input(s): AMMONIA in the last 168 hours. CBC:  Recent Labs Lab 10/05/14 1350  WBC 4.8  HGB 12.5  HCT 37.5  MCV 91.9  PLT 181   Cardiac Enzymes:  Recent Labs Lab 10/05/14 2020  CKTOTAL 102  CKMB 2.1  TROPONINI <0.03   BNP (last 3 results) No results for input(s): PROBNP in the last 8760 hours. CBG: No results for input(s): GLUCAP in the last 168 hours.  No results found for this or any previous visit (from the past 240 hour(s)).   Studies: Nm Pulmonary Perf And Vent  10/05/2014   CLINICAL DATA:  Chest pain, shortness of Breath  EXAM: NUCLEAR MEDICINE VENTILATION - PERFUSION LUNG SCAN  TECHNIQUE: Ventilation images were obtained in multiple projections using inhaled aerosol technetium 99 M DTPA. Perfusion images were obtained in multiple projections after intravenous injection of Tc-47m MAA.  RADIOPHARMACEUTICALS:  5.0  mCi Tc-45m DTPA aerosol and 43.2 mCi Tc-39m MAA  COMPARISON:  10/05/2014  FINDINGS: Ventilation: No focal ventilation defect.  Perfusion: No wedge shaped peripheral perfusion defects to suggest acute pulmonary embolism.  IMPRESSION: No evidence for pulmonary embolus.   Electronically Signed   By: Rolm Baptise  M.D.   On: 10/05/2014 19:45   Dg Chest Port 1 View  10/05/2014   CLINICAL DATA:  Body aches, palpitations, heart skipping beats, symptoms for 1 week worsened in last 2 days, history hypertension, lupus, RIGHT breast cancer  EXAM: PORTABLE CHEST - 1 VIEW  COMPARISON:  Portable exam 1223 hr compared to 07/17/2013  FINDINGS: Normal heart size, mediastinal contours and pulmonary vascularity.  Lungs clear.  No pleural effusion or pneumothorax.  No acute osseous findings.  IMPRESSION: No acute abnormalities.   Electronically Signed   By: Lavonia Dana M.D.   On: 10/05/2014 13:18    Scheduled Meds: . aspirin EC  325 mg Oral Daily  . cholecalciferol  2,000 Units Oral q morning - 10a  . hydrochlorothiazide  25 mg Oral q morning - 10a  . metoprolol succinate  50 mg Oral q morning - 10a  . pantoprazole  40 mg Oral Daily  . predniSONE  30 mg Oral Q breakfast  . sodium chloride  3 mL Intravenous Q12H  . tamoxifen  20 mg Oral Daily   Continuous Infusions: . sodium chloride 1,000 mL (10/05/14 1520)  . 0.9 % NaCl with KCl 20 mEq / L 50 mL/hr at 10/05/14 2025    Active Problems:   Malignant neoplasm of female breast   HYPERTENSION, BENIGN ESSENTIAL   GERD   LUPUS   Chest pain   Hypokalemia    Time spent: 30 min    Kelvin Cellar  [facility] Pager [phone]. If 7PM-7AM, please contact night-coverage at [email or web address], password [facility] 10/06/2014, 1:56 PM  LOS: 1 day

## 2014-10-06 NOTE — Progress Notes (Signed)
UR completed 

## 2014-10-06 NOTE — Progress Notes (Signed)
[name] had chaplain paged to ask for a Bible. The Chaplain was off campus. [name] was asked to alert the room nurse to her need and if there are no Bibles in the unit, to have the nurse page the chaplain.  [name]. [name], [place]

## 2014-10-06 NOTE — Progress Notes (Signed)
  Echocardiogram 2D Echocardiogram has been performed.  Lysle Rubens 10/06/2014, 4:15 PM

## 2014-10-07 ENCOUNTER — Encounter: Payer: Self-pay | Admitting: Specialist

## 2014-10-07 DIAGNOSIS — R079 Chest pain, unspecified: Secondary | ICD-10-CM | POA: Diagnosis not present

## 2014-10-07 LAB — URINALYSIS, ROUTINE W REFLEX MICROSCOPIC
Bilirubin Urine: NEGATIVE
Glucose, UA: NEGATIVE mg/dL
Ketones, ur: NEGATIVE mg/dL
Leukocytes, UA: NEGATIVE
Nitrite: NEGATIVE
Protein, ur: NEGATIVE mg/dL
Specific Gravity, Urine: 1.015 (ref 1.005–1.030)
Urobilinogen, UA: 0.2 mg/dL (ref 0.0–1.0)
pH: 6 (ref 5.0–8.0)

## 2014-10-07 LAB — COMPREHENSIVE METABOLIC PANEL
ALT: 24 U/L (ref 0–35)
AST: 33 U/L (ref 0–37)
Albumin: 3.7 g/dL (ref 3.5–5.2)
Alkaline Phosphatase: 80 U/L (ref 39–117)
Anion gap: 8 (ref 5–15)
BUN: 12 mg/dL (ref 6–23)
CO2: 23 mmol/L (ref 19–32)
Calcium: 9.1 mg/dL (ref 8.4–10.5)
Chloride: 106 mEq/L (ref 96–112)
Creatinine, Ser: 0.65 mg/dL (ref 0.50–1.10)
GFR calc Af Amer: >90 mL/min (ref >90)
GFR calc non Af Amer: >90 mL/min (ref >90)
Glucose, Bld: 129 mg/dL — ABNORMAL HIGH (ref 70–99)
Potassium: 3.5 mmol/L (ref 3.5–5.1)
Sodium: 137 mmol/L (ref 135–145)
Total Bilirubin: 0.2 mg/dL — ABNORMAL LOW (ref 0.3–1.2)
Total Protein: 7 g/dL (ref 6.0–8.3)

## 2014-10-07 LAB — URINE MICROSCOPIC-ADD ON

## 2014-10-07 LAB — CBC
HCT: 39.4 % (ref 36.0–46.0)
Hemoglobin: 13 g/dL (ref 12.0–15.0)
MCH: 30.4 pg (ref 26.0–34.0)
MCHC: 33 g/dL (ref 30.0–36.0)
MCV: 92.3 fL (ref 78.0–100.0)
Platelets: 219 10*3/uL (ref 150–400)
RBC: 4.27 MIL/uL (ref 3.87–5.11)
RDW: 13.1 % (ref 11.5–15.5)
WBC: 6.6 10*3/uL (ref 4.0–10.5)

## 2014-10-07 MED ORDER — PREDNISONE 10 MG PO TABS: 30.0000 mg | ORAL_TABLET | Freq: Every day | ORAL | Status: DC

## 2014-10-07 MED ORDER — PANTOPRAZOLE SODIUM 40 MG PO TBEC: 40.0000 mg | DELAYED_RELEASE_TABLET | Freq: Every day | ORAL | Status: DC

## 2014-10-07 MED ORDER — POTASSIUM CHLORIDE CRYS ER 20 MEQ PO TBCR
40.0000 meq | EXTENDED_RELEASE_TABLET | Freq: Once | ORAL | Status: AC
  Administered 2014-10-07: 40 meq via ORAL
  Filled 2014-10-07: qty 2

## 2014-10-07 NOTE — Progress Notes (Signed)
UR completed 

## 2014-10-07 NOTE — Progress Notes (Signed)
Patient left a message on my phone requesting a visit. Visited with her. Brought her a Bible, art supplies, and Science writer. 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 Prayer; listening; support. Helped her connect to the things that bring her joy, happiness, and peace.    Epifania Gore, PhD, [place]

## 2014-10-07 NOTE — Discharge Summary (Signed)
Physician Discharge Summary  [name] MRN: [id number] DOB/AGE: 1951-01-06 64 y.o.  PCP: [name], MD   Admit date: 10/05/2014 Discharge date: 10/07/2014  Discharge Diagnoses:   pleurisy   Malignant neoplasm of female breast   HYPERTENSION, BENIGN ESSENTIAL   GERD   LUPUS   Chest pain   Hypokalemia  Follow-up recommendations Follow-up with PCP in 5-7 days Follow-up rheumatology within a week repeat BMP, CBC in the next 3-5 days     Medication List    STOP taking these medications        omeprazole 20 MG capsule  Commonly known as:  PRILOSEC  Replaced by:  pantoprazole 40 MG tablet      TAKE these medications        Ginkgo Biloba 100 MG Caps  Take by mouth.     hydrochlorothiazide 25 MG tablet  Commonly known as:  HYDRODIURIL  Take 25 mg by mouth every morning.     ibuprofen 800 MG tablet  Commonly known as:  ADVIL,MOTRIN  Take 800 mg by mouth every 8 (eight) hours as needed for moderate pain.     metoprolol succinate 25 MG 24 hr tablet  Commonly known as:  TOPROL-XL  Take 50 mg by mouth every morning.     metoprolol tartrate 25 MG tablet  Commonly known as:  LOPRESSOR  Take 25 mg by mouth every morning.     pantoprazole 40 MG tablet  Commonly known as:  PROTONIX  Take 1 tablet (40 mg total) by mouth daily.     predniSONE 10 MG tablet  Commonly known as:  DELTASONE  Take 3 tablets (30 mg total) by mouth daily with breakfast.     tamoxifen 20 MG tablet  Commonly known as:  NOLVADEX  Take 1 tablet (20 mg total) by mouth daily.     Vitamin D3 2000 UNITS Tabs  Take 1 tablet by mouth every morning.     zolpidem 5 MG tablet  Commonly known as:  AMBIEN  Take 5 mg by mouth at bedtime as needed.        Discharge Condition: *  Disposition: 01-Home or Self Care   Consults:    Significant Diagnostic Studies: Nm Pulmonary Perf And Vent  10/05/2014   CLINICAL DATA:  Chest pain, shortness of Breath  EXAM: NUCLEAR MEDICINE  VENTILATION - PERFUSION LUNG SCAN  TECHNIQUE: Ventilation images were obtained in multiple projections using inhaled aerosol technetium 99 M DTPA. Perfusion images were obtained in multiple projections after intravenous injection of Tc-66mMAA.  RADIOPHARMACEUTICALS:  5.0 mCi Tc-935mTPA aerosol and 43.2 mCi Tc-9960mA  COMPARISON:  10/05/2014  FINDINGS: Ventilation: No focal ventilation defect.  Perfusion: No wedge shaped peripheral perfusion defects to suggest acute pulmonary embolism.  IMPRESSION: No evidence for pulmonary embolus.   Electronically Signed   By: KevRolm BaptiseD.   On: 10/05/2014 19:45   Dg Chest Port 1 View  10/05/2014   CLINICAL DATA:  Body aches, palpitations, heart skipping beats, symptoms for 1 week worsened in last 2 days, history hypertension, lupus, RIGHT breast cancer  EXAM: PORTABLE CHEST - 1 VIEW  COMPARISON:  Portable exam 1223 hr compared to 07/17/2013  FINDINGS: Normal heart size, mediastinal contours and pulmonary vascularity.  Lungs clear.  No pleural effusion or pneumothorax.  No acute osseous findings.  IMPRESSION: No acute abnormalities.   Electronically Signed   By: MarLavonia DanaD.   On: 10/05/2014 13:18     Microbiology:  No results found for this or any previous visit (from the past 240 hour(s)).   Labs: Results for orders placed or performed during the hospital encounter of 10/05/14 (from the past 48 hour(s))  Basic metabolic panel     Status: Abnormal   Collection Time: 10/05/14  1:50 PM  Result Value Ref Range   Sodium 139 135 - 145 mmol/L    Comment: Please note change in reference range.   Potassium 3.3 (L) 3.5 - 5.1 mmol/L    Comment: Please note change in reference range.   Chloride 108 96 - 112 mEq/L   CO2 27 19 - 32 mmol/L   Glucose, Bld 95 70 - 99 mg/dL   BUN 10 6 - 23 mg/dL   Creatinine, Ser 0.73 0.50 - 1.10 mg/dL   Calcium 9.1 8.4 - 10.5 mg/dL   GFR calc non Af Amer 88 (L) >90 mL/min   GFR calc Af Amer >90 >90 mL/min    Comment:  (NOTE) The eGFR has been calculated using the CKD EPI equation. This calculation has not been validated in all clinical situations. eGFR's persistently <90 mL/min signify possible Chronic Kidney Disease.    Anion gap 4 (L) 5 - 15  CBC     Status: None   Collection Time: 10/05/14  1:50 PM  Result Value Ref Range   WBC 4.8 4.0 - 10.5 K/uL   RBC 4.08 3.87 - 5.11 MIL/uL   Hemoglobin 12.5 12.0 - 15.0 g/dL   HCT 37.5 36.0 - 46.0 %   MCV 91.9 78.0 - 100.0 fL   MCH 30.6 26.0 - 34.0 pg   MCHC 33.3 30.0 - 36.0 g/dL   RDW 13.0 11.5 - 15.5 %   Platelets 181 150 - 400 K/uL  TSH     Status: None   Collection Time: 10/05/14  1:50 PM  Result Value Ref Range   TSH 1.288 0.350 - 4.500 uIU/mL    Comment: Performed at [facility]  BNP (order ONLY if patient complains of dyspnea/SOB AND you have documented it for THIS visit)     Status: None   Collection Time: 10/05/14  1:51 PM  Result Value Ref Range   B Natriuretic Peptide 46.0 0.0 - 100.0 pg/mL    Comment: Please note change in reference range.  APTT     Status: None   Collection Time: 10/05/14  1:51 PM  Result Value Ref Range   aPTT 24 24 - 37 seconds  Protime-INR     Status: None   Collection Time: 10/05/14  1:51 PM  Result Value Ref Range   Prothrombin Time 14.4 11.6 - 15.2 seconds   INR 1.10 0.00 - 1.49  D-dimer, quantitative     Status: Abnormal   Collection Time: 10/05/14  1:51 PM  Result Value Ref Range   D-Dimer, Quant 1.05 (H) 0.00 - 0.48 ug/mL-FEU    Comment:        AT THE INHOUSE ESTABLISHED CUTOFF VALUE OF 0.48 ug/mL FEU, THIS ASSAY HAS BEEN DOCUMENTED IN THE LITERATURE TO HAVE A SENSITIVITY AND NEGATIVE PREDICTIVE VALUE OF AT LEAST 98 TO 99%.  THE TEST RESULT SHOULD BE CORRELATED WITH AN ASSESSMENT OF THE CLINICAL PROBABILITY OF DVT / VTE.   I-stat troponin, ED (not at [facility])     Status: None   Collection Time: 10/05/14  1:58 PM  Result Value Ref Range   Troponin i, poc 0.00 0.00 - 0.08 ng/mL   Comment 3  Comment: Due to the release kinetics of cTnI, a negative result within the first hours of the onset of symptoms does not rule out myocardial infarction with certainty. If myocardial infarction is still suspected, repeat the test at appropriate intervals.   Urinalysis, Routine w reflex microscopic     Status: None   Collection Time: 10/05/14  2:12 PM  Result Value Ref Range   Color, Urine YELLOW YELLOW   APPearance CLEAR CLEAR   Specific Gravity, Urine 1.016 1.005 - 1.030   pH 7.0 5.0 - 8.0   Glucose, UA NEGATIVE NEGATIVE mg/dL   Hgb urine dipstick NEGATIVE NEGATIVE   Bilirubin Urine NEGATIVE NEGATIVE   Ketones, ur NEGATIVE NEGATIVE mg/dL   Protein, ur NEGATIVE NEGATIVE mg/dL   Urobilinogen, UA 0.2 0.0 - 1.0 mg/dL   Nitrite NEGATIVE NEGATIVE   Leukocytes, UA NEGATIVE NEGATIVE    Comment: MICROSCOPIC NOT DONE ON URINES WITH NEGATIVE PROTEIN, BLOOD, LEUKOCYTES, NITRITE, OR GLUCOSE <1000 mg/dL.  Troponin I     Status: None   Collection Time: 10/05/14  8:20 PM  Result Value Ref Range   Troponin I <0.03 <0.031 ng/mL    Comment:        NO INDICATION OF MYOCARDIAL INJURY. Please note change in reference range.   Hemoglobin A1c     Status: None   Collection Time: 10/05/14  8:20 PM  Result Value Ref Range   Hgb A1c MFr Bld 5.5 <5.7 %    Comment: (NOTE)                                                                       According to the ADA Clinical Practice Recommendations for 2011, when HbA1c is used as a screening test:  >=6.5%   Diagnostic of Diabetes Mellitus           (if abnormal result is confirmed) 5.7-6.4%   Increased risk of developing Diabetes Mellitus References:Diagnosis and Classification of Diabetes Mellitus,Diabetes JJHE,1740,81(KGYJE 1):S62-S69 and Standards of Medical Care in         Diabetes - 2011,Diabetes HUDJ,4970,26 (Suppl 1):S11-S61.    Mean Plasma Glucose 111 <117 mg/dL    Comment: Performed at [facility]   CK total and CKMB  (cardiac)     Status: None   Collection Time: 10/05/14  8:20 PM  Result Value Ref Range   Total CK 102 7 - 177 U/L   CK, MB 2.1 0.3 - 4.0 ng/mL   Relative Index 2.1 0.0 - 2.5    Comment: Performed at [facility]  Sedimentation rate     Status: None   Collection Time: 10/06/14 10:48 AM  Result Value Ref Range   Sed Rate 12 0 - 22 mm/hr     HPI HPI/Subjective: Patient is a pleasant 64 year old female with a past medical history of lupus presented to the emergency department at [facility] on 10/05/2014 with complaints of bilateral joint pain and retrosternal chest pain having atypical features. She was initially worked up with a chest x-ray which showed no acute abnormalities. A VQ scan showed no evidence of pulmonary embolism. Venous Doppler was negative. Symptoms felt to be secondary to lupus flareup as she was started on oral steroids. Troponins were cycled and  remained negative  HOSPITAL COURSE:  Assessment/Plan:  Chest pain likely secondary to pleurisy. -Patient presenting with chest pain having atypical features. Chest x-ray showed no acute abnormalities, VQ scan was negative, troponins negative, EKG did not reveal acute ischemic changes. 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 No pericardial effusion Patient to continue with prednisone without taper and follow-up with rheumatology ASAP   2. Suspected lupus flare-up -Patient presenting with complaints of bilateral joint pain , abdominal pain and chest pain.  ESR 12 -Meanwhile continue oral steroid therapy UA negative   3. Hypertension -Blood pressures controlled -Continue metoprolol 50 mg by mouth daily  4. Hypokalemia -Initial labs showing potassium of 3.3 which was replaced with oral potassium supplementation Patient  would need a repeat BMP, CBC in the next 3-5 days    Discharge Exam:    Blood pressure 141/76, pulse 73, temperature 98.1 F (36.7 C), temperature source Oral, resp. rate 18, height _0  (1.727 m), weight 90.719 kg (200 lb), SpO2 97 %.    General: Patient states feeling little better although reports having ongoing joint pain and some chest pain  Cardiovascular: Regular rate and rhythm normal S1-S2 no murmurs rubs or gallops  Respiratory: Normal respiratory effort, lungs are clear to auscultation bilaterally  Abdomen: Soft nontender nondistended positive bowel sounds  Musculoskeletal: No edema        Follow-up Information    Follow up with [name], MD. Schedule an appointment as soon as possible for a visit in 1 week.   Specialty:  Internal Medicine   Contact information:   [address] [place] [address] [phone]       Follow up with Rheumatology. Schedule an appointment as soon as possible for a visit in 3 days.      SignedReyne Dumas 10/07/2014, 1:32 PM

## 2014-11-27 DIAGNOSIS — R102 Pelvic and perineal pain: Secondary | ICD-10-CM | POA: Diagnosis not present

## 2014-11-27 DIAGNOSIS — N750 Cyst of Bartholin's gland: Secondary | ICD-10-CM | POA: Diagnosis not present

## 2014-11-27 DIAGNOSIS — R350 Frequency of micturition: Secondary | ICD-10-CM | POA: Diagnosis not present

## 2014-11-27 DIAGNOSIS — R300 Dysuria: Secondary | ICD-10-CM | POA: Diagnosis not present

## 2014-12-09 DIAGNOSIS — R102 Pelvic and perineal pain: Secondary | ICD-10-CM | POA: Diagnosis not present

## 2014-12-09 DIAGNOSIS — N750 Cyst of Bartholin's gland: Secondary | ICD-10-CM | POA: Diagnosis not present

## 2014-12-17 DIAGNOSIS — M7071 Other bursitis of hip, right hip: Secondary | ICD-10-CM | POA: Diagnosis not present

## 2014-12-17 DIAGNOSIS — M329 Systemic lupus erythematosus, unspecified: Secondary | ICD-10-CM | POA: Diagnosis not present

## 2014-12-17 DIAGNOSIS — I10 Essential (primary) hypertension: Secondary | ICD-10-CM | POA: Diagnosis not present

## 2014-12-17 DIAGNOSIS — E669 Obesity, unspecified: Secondary | ICD-10-CM | POA: Diagnosis not present

## 2014-12-20 ENCOUNTER — Other Ambulatory Visit: Payer: Self-pay | Admitting: Obstetrics and Gynecology

## 2014-12-20 DIAGNOSIS — R102 Pelvic and perineal pain: Secondary | ICD-10-CM

## 2014-12-24 ENCOUNTER — Other Ambulatory Visit: Payer: Medicare Other

## 2014-12-25 ENCOUNTER — Ambulatory Visit
Admission: RE | Admit: 2014-12-25 | Discharge: 2014-12-25 | Disposition: A | Discharge status: Home / Self Care | Payer: Commercial Managed Care - HMO | Source: Ambulatory Visit | Attending: Obstetrics and Gynecology | Admitting: Obstetrics and Gynecology

## 2014-12-25 DIAGNOSIS — Z9049 Acquired absence of other specified parts of digestive tract: Secondary | ICD-10-CM | POA: Diagnosis not present

## 2014-12-25 DIAGNOSIS — R109 Unspecified abdominal pain: Secondary | ICD-10-CM | POA: Diagnosis not present

## 2014-12-25 DIAGNOSIS — R102 Pelvic and perineal pain: Secondary | ICD-10-CM

## 2014-12-25 DIAGNOSIS — Z90710 Acquired absence of both cervix and uterus: Secondary | ICD-10-CM | POA: Diagnosis not present

## 2015-01-07 DIAGNOSIS — Z5181 Encounter for therapeutic drug level monitoring: Secondary | ICD-10-CM | POA: Diagnosis not present

## 2015-01-07 DIAGNOSIS — Z79899 Other long term (current) drug therapy: Secondary | ICD-10-CM | POA: Diagnosis not present

## 2015-01-07 DIAGNOSIS — Z01419 Encounter for gynecological examination (general) (routine) without abnormal findings: Secondary | ICD-10-CM | POA: Diagnosis not present

## 2015-01-07 DIAGNOSIS — M25562 Pain in left knee: Secondary | ICD-10-CM | POA: Diagnosis not present

## 2015-01-07 DIAGNOSIS — M7071 Other bursitis of hip, right hip: Secondary | ICD-10-CM | POA: Diagnosis not present

## 2015-01-07 DIAGNOSIS — Z01411 Encounter for gynecological examination (general) (routine) with abnormal findings: Secondary | ICD-10-CM | POA: Diagnosis not present

## 2015-01-07 DIAGNOSIS — Z0000 Encounter for general adult medical examination without abnormal findings: Secondary | ICD-10-CM | POA: Diagnosis not present

## 2015-01-07 DIAGNOSIS — M329 Systemic lupus erythematosus, unspecified: Secondary | ICD-10-CM | POA: Diagnosis not present

## 2015-01-07 DIAGNOSIS — I10 Essential (primary) hypertension: Secondary | ICD-10-CM | POA: Diagnosis not present

## 2015-01-24 ENCOUNTER — Other Ambulatory Visit: Payer: Self-pay | Admitting: *Deleted

## 2015-01-24 ENCOUNTER — Encounter: Payer: Self-pay | Admitting: General Practice

## 2015-01-24 DIAGNOSIS — C50919 Malignant neoplasm of unspecified site of unspecified female breast: Secondary | ICD-10-CM

## 2015-01-24 NOTE — Progress Notes (Signed)
[name] called me this afternoon to explore possibility of assisting with [facility].  Then connected in lobby.  Provided pastoral presence and reflective listening, serving as witness to her cancer and healing story.  Per pt, her faith is the core of her healing and central source of meaning/purpose in life.  Per pt, she also values encouraging other patients and survivors, so she stopped by for information about upcoming events to share with others.  She is aware of ongoing chaplain availability for support and also extended encouragement and care to me.  [place], [place]

## 2015-01-27 ENCOUNTER — Other Ambulatory Visit (HOSPITAL_BASED_OUTPATIENT_CLINIC_OR_DEPARTMENT_OTHER): Payer: Commercial Managed Care - HMO

## 2015-01-27 ENCOUNTER — Ambulatory Visit (HOSPITAL_BASED_OUTPATIENT_CLINIC_OR_DEPARTMENT_OTHER): Payer: Commercial Managed Care - HMO | Admitting: Nurse Practitioner

## 2015-01-27 ENCOUNTER — Encounter: Payer: Self-pay | Admitting: Nurse Practitioner

## 2015-01-27 ENCOUNTER — Other Ambulatory Visit: Payer: Self-pay | Admitting: Nurse Practitioner

## 2015-01-27 VITALS — BP 138/82 | HR 69 | Temp 98.9°F | Resp 18 | Ht 68.0 in | Wt 202.9 lb

## 2015-01-27 DIAGNOSIS — Z170 Estrogen receptor positive status [ER+]: Secondary | ICD-10-CM

## 2015-01-27 DIAGNOSIS — M150 Primary generalized (osteo)arthritis: Secondary | ICD-10-CM | POA: Diagnosis not present

## 2015-01-27 DIAGNOSIS — C50411 Malignant neoplasm of upper-outer quadrant of right female breast: Secondary | ICD-10-CM

## 2015-01-27 DIAGNOSIS — Z853 Personal history of malignant neoplasm of breast: Secondary | ICD-10-CM

## 2015-01-27 DIAGNOSIS — M19042 Primary osteoarthritis, left hand: Secondary | ICD-10-CM | POA: Diagnosis not present

## 2015-01-27 DIAGNOSIS — M19041 Primary osteoarthritis, right hand: Secondary | ICD-10-CM | POA: Diagnosis not present

## 2015-01-27 DIAGNOSIS — M2550 Pain in unspecified joint: Secondary | ICD-10-CM | POA: Diagnosis not present

## 2015-01-27 DIAGNOSIS — M328 Other forms of systemic lupus erythematosus: Secondary | ICD-10-CM | POA: Diagnosis not present

## 2015-01-27 DIAGNOSIS — C50919 Malignant neoplasm of unspecified site of unspecified female breast: Secondary | ICD-10-CM

## 2015-01-27 DIAGNOSIS — C50911 Malignant neoplasm of unspecified site of right female breast: Secondary | ICD-10-CM

## 2015-01-27 LAB — COMPREHENSIVE METABOLIC PANEL (CC13)
ALT: 17 U/L (ref 0–55)
AST: 19 U/L (ref 5–34)
Albumin: 3.7 g/dL (ref 3.5–5.0)
Alkaline Phosphatase: 71 U/L (ref 40–150)
Anion Gap: 11 mEq/L (ref 3–11)
BUN: 12.2 mg/dL (ref 7.0–26.0)
CO2: 25 mEq/L (ref 22–29)
Calcium: 9.2 mg/dL (ref 8.4–10.4)
Chloride: 106 mEq/L (ref 98–109)
Creatinine: 0.8 mg/dL (ref 0.6–1.1)
EGFR: >90 mL/min/{1.73_m2} (ref >90)
Glucose: 117 mg/dl (ref 70–140)
Potassium: 3.7 mEq/L (ref 3.5–5.1)
Sodium: 142 mEq/L (ref 136–145)
Total Bilirubin: 0.34 mg/dL (ref 0.20–1.20)
Total Protein: 6.8 g/dL (ref 6.4–8.3)

## 2015-01-27 LAB — CBC WITH DIFFERENTIAL/PLATELET
BASO%: 0.5 % (ref 0.0–2.0)
Basophils Absolute: 0 10*3/uL (ref 0.0–0.1)
EOS%: 0.7 % (ref 0.0–7.0)
Eosinophils Absolute: 0 10*3/uL (ref 0.0–0.5)
HCT: 38.5 % (ref 34.8–46.6)
HGB: 12.9 g/dL (ref 11.6–15.9)
LYMPH%: 42.8 % (ref 14.0–49.7)
MCH: 30.6 pg (ref 25.1–34.0)
MCHC: 33.5 g/dL (ref 31.5–36.0)
MCV: 91.4 fL (ref 79.5–101.0)
MONO#: 0.4 10*3/uL (ref 0.1–0.9)
MONO%: 8.3 % (ref 0.0–14.0)
NEUT#: 2.1 10*3/uL (ref 1.5–6.5)
NEUT%: 47.7 % (ref 38.4–76.8)
Platelets: 207 10*3/uL (ref 145–400)
RBC: 4.21 10*6/uL (ref 3.70–5.45)
RDW: 12.8 % (ref 11.2–14.5)
WBC: 4.3 10*3/uL (ref 3.9–10.3)
lymph#: 1.9 10*3/uL (ref 0.9–3.3)

## 2015-01-27 MED ORDER — ZOLPIDEM TARTRATE 5 MG PO TABS: 5.0000 mg | ORAL_TABLET | Freq: Every evening | ORAL | Status: DC | PRN

## 2015-01-27 NOTE — Progress Notes (Signed)
[place]  [phone] [phone]     ID: [name] DOB: 04/08/1951  MR#: [id number]  XYD#:[id number]  Patient Care Team: Glendale Chard, MD as PCP - General (Internal Medicine) Gavin Pound, MD as Consulting Physician (Rheumatology) PCP: [name], MD GYN: SU:  OTHER MD: Arloa Koh MD  CHIEF COMPLAINT: Estrogen receptor positive breast cancer  CURRENT TREATMENT: tamoxifen    BREAST CANCER HISTORY: From Dr. Dana Allan earlier summary:  "#1 patient is status post lumpectomy with sentinel lymph node biopsy followed by radiation to the right breast. In December 2009 patient began aromatase inhibitor but could not tolerate it.  #2 therefore she began in March 2010 tamoxifen 20 mg daily. However she has experienced significant amount of aches and pains and in December 2012 discontinued it. At this time she does not want to go back on any kind of antiestrogen therapy due to side effects.  #3 patient would like to go back on tamoxifen 20 mg daily. She recently attended finding your new normal seminar. And because of that she wants to she would like to go back on the tamoxifen. We discussed side effects again. She was sent to her pharmacy."  Her subsequent history is as detailed below  INTERVAL HISTORY: [name] returns today for follow up of her breast cancer. She continues on tamoxifen and is tolerating it relatively well. She has mild hot flashes, and vaginal changes which are not a bad thing in her eyes. The interval history is remarkable for a hospital admission for chest pain, related to her history of lupus. She also has a history of rheumatoid arthritis, and she believes she is having a flare up now. She is due to return to her rheumatologist soon where they will likely put her on a steroid taper. She is trying to walk more for exercise.    REVIEW OF SYSTEMS: [name] denies fevers, chills, nausea, vomiting, or changes in  bowel or bladder habit. She has no shortness of breath, chest pain, cough, or palpitations. Her appetite is healthy. She has chronic insomnia and needs a refill on her [place]. She wears dentures and has mild hearing loss. She denies headaches, dizziness, vision changes, or weakness. A detailed review of systems is otherwise stable.   PAST MEDICAL HISTORY: Past Medical History  Diagnosis Date  . Hypertension   . Heart murmur     functional murmur, echo in [place]- long time ago   . Lupus   . Blood transfusion     1986- post-partial hysterectomy  . Hepatitis     Hep. A- 1978  . Chronic kidney disease   . GERD (gastroesophageal reflux disease)     recently taken off anti reflux tx  . Arthritis     R knee, hands   . No pertinent past medical history     will see cardiac for review this week, 08/24/2011  . rt breast ca dx'd 12/2007    xrt comp;    PAST SURGICAL HISTORY: Past Surgical History  Procedure Laterality Date  . Joint replacement      L knee replacement- 2006, arthroscopic surg. both knees   . Mastectomy      R breast- lumpectomy -2009  . Tonsillectomy      1954  . Appendectomy      1978 & tubal ligation   . Abdominal hysterectomy      1986  . Tubal ligation    . Breast surgery      R  breast lumpectomy  . Cholecystectomy      1978- open   . Total knee arthroplasty  08/30/2011    Procedure: TOTAL KNEE ARTHROPLASTY;  Surgeon: Kerin Salen;  Location: [facility];  Service: Orthopedics;  Laterality: Right;  Right Total Knee Arthroplasty    FAMILY HISTORY Family History  Problem Relation Age of Onset  . Anesthesia problems Neg Hx   . Hypotension Neg Hx   . Malignant hyperthermia Neg Hx   . Pseudochol deficiency Neg Hx   . Heart attack Father   . Cancer Other   . Hypertension Other    the patient's father died from a heart attack at the age of 81. The patient's mother died at the age of 10. She had a history of breast cancer and one of her sisters was diagnosed with breast  cancer at the age of 86. The patient had 5 brothers and 5 sisters. There is no other history of breast or ovarian cancer in the family to her knowledge.  GYNECOLOGIC HISTORY:  No LMP recorded. Patient has had a hysterectomy. Menarche age 64, first live birth age 64. The patient is GX P3. She had a simple hysterectomy in 1986 (no salpingo-oophorectomy.  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 She remarried in 2011. Her husband Laveda Abbe is retired from [employer]. The patient has one adopted son in addition to her own children. 3 of her children live in town 1 in [place]. She has 10 grandchildren. She attends [employer] locally    ADVANCED DIRECTIVES: Not in place   HEALTH MAINTENANCE: History  Substance Use Topics  . Smoking status: Never Smoker   . Smokeless tobacco: Not on file  . Alcohol Use: No     Colonoscopy:  PAP:  Bone density: 04/30/2008 was normal  Lipid panel:  Allergies  Allergen Reactions  . Iodine Anaphylaxis  . Iohexol Anaphylaxis     Code: HIVES, Desc: PT IS ALLERGIC TO IODINATED CONTRAST; REACTION INCLUDES FACIAL SWELLING,HIVES,RESPIRATORY DISTRESS;PT HASN'T HAD SCAN W/PREMEDS.;REFUSED CONTRAST OF ANY KIND!  KR   . Latex Anaphylaxis  . Penicillins Anaphylaxis  . Tetracycline Anaphylaxis  . Milk-Related Compounds     Lactose intolerant    Current Outpatient Prescriptions  Medication Sig Dispense Refill  . Cholecalciferol (VITAMIN D3) 2000 UNITS TABS Take 1 tablet by mouth every morning.     . Ginkgo Biloba 100 MG CAPS Take by mouth.    . hydrochlorothiazide (HYDRODIURIL) 25 MG tablet Take 25 mg by mouth every morning.     Marland Kitchen HYDROcodone-acetaminophen (NORCO/VICODIN) 5-325 MG per tablet     . metoprolol succinate (TOPROL-XL) 25 MG 24 hr tablet Take 50 mg by mouth every morning.    . pantoprazole (PROTONIX) 40 MG tablet Take 1 tablet (40 mg total) by mouth daily. 30 tablet 0  .  tamoxifen (NOLVADEX) 20 MG tablet Take 1 tablet (20 mg total) by mouth daily. 90 tablet 12  . zolpidem (AMBIEN) 5 MG tablet Take 1 tablet (5 mg total) by mouth at bedtime as needed. 90 tablet 3  . ibuprofen (ADVIL,MOTRIN) 800 MG tablet Take 800 mg by mouth every 8 (eight) hours as needed for moderate pain.      No current facility-administered medications for this visit.    OBJECTIVE: Middle-aged African-American woman who appears stated age 58 Vitals:   01/27/15 0842  BP: 149/99  Pulse: 69  Temp: 98.9  F (37.2 C)  Resp: 18     Body mass index is 30.86 kg/(m^2).    ECOG FS:1 - Symptomatic but completely ambulatory   Skin: warm, dry  HEENT: sclerae anicteric, conjunctivae pink, oropharynx clear. No thrush or mucositis.  Lymph Nodes: No cervical or supraclavicular lymphadenopathy  Lungs: clear to auscultation bilaterally, no rales, wheezes, or rhonci  Heart: regular rate and rhythm  Abdomen: round, soft, non tender, positive bowel sounds  Musculoskeletal: No focal spinal tenderness, no peripheral edema  Neuro: non focal, well oriented, positive affect  Breasts: right breast status post lumpectomy and radiation. No evidence of recurrent disease. Right axilla benign. Left breast unremarkable.  LAB RESULTS:  CMP     Component Value Date/Time   NA 137 10/07/2014 1354   NA 142 08/26/2014 0847   NA 136 03/13/2010 1158   K 3.5 10/07/2014 1354   K 3.9 08/26/2014 0847   K 3.9 03/13/2010 1158   CL 106 10/07/2014 1354   CL 102 01/25/2013 0909   CL 101 03/13/2010 1158   CO2 23 10/07/2014 1354   CO2 26 08/26/2014 0847   CO2 30 03/13/2010 1158   GLUCOSE 129* 10/07/2014 1354   GLUCOSE 89 08/26/2014 0847   GLUCOSE 81 01/25/2013 0909   GLUCOSE 77 03/13/2010 1158   BUN 12 10/07/2014 1354   BUN 12.5 08/26/2014 0847   BUN 14 03/13/2010 1158   CREATININE 0.65 10/07/2014 1354   CREATININE 0.8 08/26/2014 0847   CREATININE 0.8 03/13/2010 1158   CALCIUM 9.1 10/07/2014 1354   CALCIUM  9.7 08/26/2014 0847   CALCIUM 9.7 03/13/2010 1158   PROT 7.0 10/07/2014 1354   PROT 7.0 08/26/2014 0847   PROT 7.1 03/13/2010 1158   ALBUMIN 3.7 10/07/2014 1354   ALBUMIN 3.4* 08/26/2014 0847   AST 33 10/07/2014 1354   AST 22 08/26/2014 0847   AST 31 03/13/2010 1158   ALT 24 10/07/2014 1354   ALT 24 08/26/2014 0847   ALT 32 03/13/2010 1158   ALKPHOS 80 10/07/2014 1354   ALKPHOS 74 08/26/2014 0847   ALKPHOS 65 03/13/2010 1158   BILITOT 0.2* 10/07/2014 1354   BILITOT 0.53 08/26/2014 0847   BILITOT 0.50 03/13/2010 1158   GFRNONAA >90 10/07/2014 1354   GFRAA >90 10/07/2014 1354    INo results found for: SPEP, UPEP  Lab Results  Component Value Date   WBC 4.3 01/27/2015   NEUTROABS 2.1 01/27/2015   HGB 12.9 01/27/2015   HCT 38.5 01/27/2015   MCV 91.4 01/27/2015   PLT 207 01/27/2015      Chemistry      Component Value Date/Time   NA 137 10/07/2014 1354   NA 142 08/26/2014 0847   NA 136 03/13/2010 1158   K 3.5 10/07/2014 1354   K 3.9 08/26/2014 0847   K 3.9 03/13/2010 1158   CL 106 10/07/2014 1354   CL 102 01/25/2013 0909   CL 101 03/13/2010 1158   CO2 23 10/07/2014 1354   CO2 26 08/26/2014 0847   CO2 30 03/13/2010 1158   BUN 12 10/07/2014 1354   BUN 12.5 08/26/2014 0847   BUN 14 03/13/2010 1158   CREATININE 0.65 10/07/2014 1354   CREATININE 0.8 08/26/2014 0847   CREATININE 0.8 03/13/2010 1158      Component Value Date/Time   CALCIUM 9.1 10/07/2014 1354   CALCIUM 9.7 08/26/2014 0847   CALCIUM 9.7 03/13/2010 1158   ALKPHOS 80 10/07/2014 1354   ALKPHOS 74 08/26/2014 0847   ALKPHOS  65 03/13/2010 1158   AST 33 10/07/2014 1354   AST 22 08/26/2014 0847   AST 31 03/13/2010 1158   ALT 24 10/07/2014 1354   ALT 24 08/26/2014 0847   ALT 32 03/13/2010 1158   BILITOT 0.2* 10/07/2014 1354   BILITOT 0.53 08/26/2014 0847   BILITOT 0.50 03/13/2010 1158       Lab Results  Component Value Date   LABCA2 16 11/09/2010    No components found for: [id number]  No  results for input(s): INR in the last 168 hours.  Urinalysis    Component Value Date/Time   COLORURINE YELLOW 10/07/2014 [place] 10/07/2014 1605   LABSPEC 1.015 10/07/2014 1605   LABSPEC 1.015 06/19/2008 1027   PHURINE 6.0 10/07/2014 1605   PHURINE 6.0 06/19/2008 1027   GLUCOSEU NEGATIVE 10/07/2014 1605   HGBUR TRACE* 10/07/2014 1605   HGBUR negative 01/22/2010 [place] 06/19/2008 [place] 10/07/2014 1605   BILIRUBINUR Negative 06/19/2008 [place] 10/07/2014 1605   KETONESUR Negative 06/19/2008 [place] 10/07/2014 1605   PROTEINUR < 30 06/19/2008 1027   UROBILINOGEN 0.2 10/07/2014 1605   NITRITE NEGATIVE 10/07/2014 1605   NITRITE Negative 06/19/2008 [place] 10/07/2014 1605   LEUKOCYTESUR Moderate 06/19/2008 1027    STUDIES: CLINICAL DATA: History of right breast cancer status post lumpectomy July 2009. History of prior benign excisional biopsy of the left breast. Annual exam.  EXAM: DIGITAL DIAGNOSTIC BILATERAL MAMMOGRAM WITH CAD  COMPARISON: With priors  ACR Breast Density Category b: There are scattered areas of fibroglandular density.  FINDINGS: There are stable lumpectomy changes in the superior right breast. Biopsy clip is noted the right lumpectomy site. No mass, nonsurgical distortion, or suspicious microcalcification is identified in either breast to suggest malignancy.  Mammographic images were processed with CAD.  IMPRESSION: No evidence of malignancy in either breast. Lumpectomy changes of the right breast.  RECOMMENDATION: Diagnostic mammogram is suggested in 1 year. (Code:DM-B-01Y)  I have discussed the findings and recommendations with the patient. Results were also provided in writing at the conclusion of the visit. If applicable, a reminder letter will be sent to the patient regarding the next appointment.  BI-RADS CATEGORY 2:  Benign Finding(s)   Electronically Signed  By: Curlene Dolphin M.D.  On: 04/08/2014 09:48  ASSESSMENT: 64 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  (2) status post right lumpectomy 03/20/2008 for invasive lobular carcinoma, multifocal, pT1a pNX, stage IA, grade 2, strongly estrogen and progesterone receptor positive, HER-2 negative. with negative margins.  (3) status post right axillary sentinel lymph node sampling 04/17/2008, the single sentinel lymph node being clear  (4) Oncotype score of 10 predicts an outside the breast risk of recurrence of 7% if the patient's only systemic therapy is tamoxifen for 5 years.  (5) Additional right breast biopsy 05/23/2008 to evaluate microcalcifications seen on pre-radiotherapy mammography showed only atypical lobular hyperplasia  (6) Completed radiation to the right breast 07/26/2008 (50 gray +14 gray boost).  (7) Started on anastrozole November 2009, with poor tolerance  (8). Started on tamoxifen March 2010, continued to December 2012, resumed December 2014   (9) status post simple hysterectomy 1986 (no salpingo-oophorectomy)  PLAN: Leyna continues to do well as far as her breast cancer is concerned. She is now almost 7  years out from her definitive surgery with no evidence of recurrent disease. The labs were reviewed in detail and were entirely stable. She is tolerating the tamoxifen well and will continue this drug until December 2017.   Shakeitha is due for her next mammogram in July. She will return (by request) for labs and a follow up visit in 6 months. She understands and agrees with this plan. She knows the goal of treatment in her case is cure. She has been encouraged to call with any issues that might arise before her next visit here.    Laurie Panda, NP    01/27/2015 9:06 AM

## 2015-01-28 ENCOUNTER — Telehealth: Payer: Self-pay | Admitting: Nurse Practitioner

## 2015-01-28 NOTE — Telephone Encounter (Signed)
Left message to confirm appointment for October. Mailed calendar for October & July

## 2015-01-29 NOTE — Addendum Note (Signed)
Addended by: Marcelino Duster on: 01/29/2015 10:04 AM   Modules accepted: Orders

## 2015-02-27 ENCOUNTER — Encounter (HOSPITAL_COMMUNITY): Payer: Self-pay | Admitting: Emergency Medicine

## 2015-02-27 ENCOUNTER — Emergency Department (HOSPITAL_COMMUNITY)
Admission: EM | Admit: 2015-02-27 | Discharge: 2015-02-27 | Disposition: A | Discharge status: Home / Self Care | Payer: Commercial Managed Care - HMO | Attending: Emergency Medicine | Admitting: Emergency Medicine

## 2015-02-27 ENCOUNTER — Emergency Department (HOSPITAL_COMMUNITY): Payer: Commercial Managed Care - HMO

## 2015-02-27 DIAGNOSIS — S3992XA Unspecified injury of lower back, initial encounter: Secondary | ICD-10-CM | POA: Insufficient documentation

## 2015-02-27 DIAGNOSIS — M25551 Pain in right hip: Secondary | ICD-10-CM | POA: Diagnosis not present

## 2015-02-27 DIAGNOSIS — I129 Hypertensive chronic kidney disease with stage 1 through stage 4 chronic kidney disease, or unspecified chronic kidney disease: Secondary | ICD-10-CM | POA: Insufficient documentation

## 2015-02-27 DIAGNOSIS — M25552 Pain in left hip: Secondary | ICD-10-CM | POA: Diagnosis not present

## 2015-02-27 DIAGNOSIS — Z91040 Latex allergy status: Secondary | ICD-10-CM | POA: Insufficient documentation

## 2015-02-27 DIAGNOSIS — Z79899 Other long term (current) drug therapy: Secondary | ICD-10-CM | POA: Insufficient documentation

## 2015-02-27 DIAGNOSIS — N189 Chronic kidney disease, unspecified: Secondary | ICD-10-CM | POA: Diagnosis not present

## 2015-02-27 DIAGNOSIS — Z853 Personal history of malignant neoplasm of breast: Secondary | ICD-10-CM | POA: Insufficient documentation

## 2015-02-27 DIAGNOSIS — Z8619 Personal history of other infectious and parasitic diseases: Secondary | ICD-10-CM | POA: Insufficient documentation

## 2015-02-27 DIAGNOSIS — Y9389 Activity, other specified: Secondary | ICD-10-CM | POA: Diagnosis not present

## 2015-02-27 DIAGNOSIS — Z880 Allergy status to penicillin: Secondary | ICD-10-CM | POA: Diagnosis not present

## 2015-02-27 DIAGNOSIS — Y998 Other external cause status: Secondary | ICD-10-CM | POA: Diagnosis not present

## 2015-02-27 DIAGNOSIS — R011 Cardiac murmur, unspecified: Secondary | ICD-10-CM | POA: Diagnosis not present

## 2015-02-27 DIAGNOSIS — K219 Gastro-esophageal reflux disease without esophagitis: Secondary | ICD-10-CM | POA: Diagnosis not present

## 2015-02-27 DIAGNOSIS — M542 Cervicalgia: Secondary | ICD-10-CM | POA: Diagnosis not present

## 2015-02-27 DIAGNOSIS — S199XXA Unspecified injury of neck, initial encounter: Secondary | ICD-10-CM | POA: Insufficient documentation

## 2015-02-27 DIAGNOSIS — S3993XA Unspecified injury of pelvis, initial encounter: Secondary | ICD-10-CM | POA: Diagnosis not present

## 2015-02-27 DIAGNOSIS — M1990 Unspecified osteoarthritis, unspecified site: Secondary | ICD-10-CM | POA: Diagnosis not present

## 2015-02-27 DIAGNOSIS — M549 Dorsalgia, unspecified: Secondary | ICD-10-CM

## 2015-02-27 DIAGNOSIS — Y92410 Unspecified street and highway as the place of occurrence of the external cause: Secondary | ICD-10-CM | POA: Insufficient documentation

## 2015-02-27 MED ORDER — KETOROLAC TROMETHAMINE 60 MG/2ML IM SOLN: 60.0000 mg | Freq: Once | INTRAMUSCULAR | Status: DC

## 2015-02-27 MED ORDER — ORPHENADRINE CITRATE ER 100 MG PO TB12: 100.0000 mg | ORAL_TABLET | Freq: Two times a day (BID) | ORAL | Status: DC | PRN

## 2015-02-27 MED ORDER — DIAZEPAM 5 MG/ML IJ SOLN
5.0000 mg | Freq: Once | INTRAMUSCULAR | Status: AC
  Administered 2015-02-27: 5 mg via INTRAMUSCULAR
  Filled 2015-02-27: qty 2

## 2015-02-27 MED ORDER — HYDROCODONE-ACETAMINOPHEN 5-325 MG PO TABS
2.0000 | ORAL_TABLET | Freq: Once | ORAL | Status: AC
  Administered 2015-02-27: 2 via ORAL
  Filled 2015-02-27: qty 2

## 2015-02-27 MED ORDER — IBUPROFEN 800 MG PO TABS: 800.0000 mg | ORAL_TABLET | Freq: Three times a day (TID) | ORAL | Status: DC | PRN

## 2015-02-27 MED ORDER — HYDROCODONE-ACETAMINOPHEN 5-325 MG PO TABS: 1.0000 | ORAL_TABLET | Freq: Four times a day (QID) | ORAL | Status: DC | PRN

## 2015-02-27 MED ORDER — KETOROLAC TROMETHAMINE 60 MG/2ML IM SOLN
30.0000 mg | Freq: Once | INTRAMUSCULAR | Status: AC
  Administered 2015-02-27: 30 mg via INTRAMUSCULAR
  Filled 2015-02-27: qty 2
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
## 2015-02-27 NOTE — Discharge Instructions (Signed)
Take motrin for pain.   Take norflex for muscle spasms.   Take vicodin for severe pain. Do NOT drive with it.   Rest for several days.   Follow up with your doctor.   Return to ER if you have severe pain, unable to walk, chest pain, abdominal pain.

## 2015-02-27 NOTE — ED Notes (Signed)
Patient was educated not to drive, operate heavy machinery, or drink alcohol while taking narcotic medication.  
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
## 2015-04-07 ENCOUNTER — Encounter: Payer: Self-pay | Admitting: Genetic Counselor

## 2015-04-25 DIAGNOSIS — I10 Essential (primary) hypertension: Secondary | ICD-10-CM | POA: Diagnosis not present

## 2015-04-25 DIAGNOSIS — Z79899 Other long term (current) drug therapy: Secondary | ICD-10-CM | POA: Diagnosis not present

## 2015-04-25 DIAGNOSIS — M329 Systemic lupus erythematosus, unspecified: Secondary | ICD-10-CM | POA: Diagnosis not present

## 2015-04-25 DIAGNOSIS — J0111 Acute recurrent frontal sinusitis: Secondary | ICD-10-CM | POA: Diagnosis not present

## 2015-05-30 ENCOUNTER — Telehealth: Payer: Self-pay | Admitting: *Deleted

## 2015-05-30 ENCOUNTER — Emergency Department (HOSPITAL_COMMUNITY): Payer: No Typology Code available for payment source

## 2015-05-30 ENCOUNTER — Emergency Department (HOSPITAL_COMMUNITY)
Admission: EM | Admit: 2015-05-30 | Discharge: 2015-05-30 | Disposition: A | Discharge status: Home / Self Care | Payer: No Typology Code available for payment source | Attending: Emergency Medicine | Admitting: Emergency Medicine

## 2015-05-30 ENCOUNTER — Encounter (HOSPITAL_COMMUNITY): Payer: Self-pay

## 2015-05-30 DIAGNOSIS — S8991XA Unspecified injury of right lower leg, initial encounter: Secondary | ICD-10-CM | POA: Diagnosis not present

## 2015-05-30 DIAGNOSIS — M25559 Pain in unspecified hip: Secondary | ICD-10-CM | POA: Diagnosis not present

## 2015-05-30 DIAGNOSIS — R011 Cardiac murmur, unspecified: Secondary | ICD-10-CM | POA: Diagnosis not present

## 2015-05-30 DIAGNOSIS — S79912A Unspecified injury of left hip, initial encounter: Secondary | ICD-10-CM | POA: Diagnosis not present

## 2015-05-30 DIAGNOSIS — Y998 Other external cause status: Secondary | ICD-10-CM | POA: Insufficient documentation

## 2015-05-30 DIAGNOSIS — Y9389 Activity, other specified: Secondary | ICD-10-CM | POA: Diagnosis not present

## 2015-05-30 DIAGNOSIS — Z880 Allergy status to penicillin: Secondary | ICD-10-CM | POA: Diagnosis not present

## 2015-05-30 DIAGNOSIS — S3991XA Unspecified injury of abdomen, initial encounter: Secondary | ICD-10-CM | POA: Diagnosis not present

## 2015-05-30 DIAGNOSIS — Z87448 Personal history of other diseases of urinary system: Secondary | ICD-10-CM | POA: Diagnosis not present

## 2015-05-30 DIAGNOSIS — M25562 Pain in left knee: Secondary | ICD-10-CM | POA: Diagnosis not present

## 2015-05-30 DIAGNOSIS — S299XXA Unspecified injury of thorax, initial encounter: Secondary | ICD-10-CM | POA: Insufficient documentation

## 2015-05-30 DIAGNOSIS — M25512 Pain in left shoulder: Secondary | ICD-10-CM | POA: Diagnosis not present

## 2015-05-30 DIAGNOSIS — M1990 Unspecified osteoarthritis, unspecified site: Secondary | ICD-10-CM | POA: Diagnosis not present

## 2015-05-30 DIAGNOSIS — S4992XA Unspecified injury of left shoulder and upper arm, initial encounter: Secondary | ICD-10-CM | POA: Diagnosis not present

## 2015-05-30 DIAGNOSIS — Y92410 Unspecified street and highway as the place of occurrence of the external cause: Secondary | ICD-10-CM | POA: Insufficient documentation

## 2015-05-30 DIAGNOSIS — M7989 Other specified soft tissue disorders: Secondary | ICD-10-CM | POA: Diagnosis not present

## 2015-05-30 DIAGNOSIS — Z859 Personal history of malignant neoplasm, unspecified: Secondary | ICD-10-CM | POA: Diagnosis not present

## 2015-05-30 DIAGNOSIS — S199XXA Unspecified injury of neck, initial encounter: Secondary | ICD-10-CM | POA: Diagnosis not present

## 2015-05-30 DIAGNOSIS — I10 Essential (primary) hypertension: Secondary | ICD-10-CM | POA: Diagnosis not present

## 2015-05-30 DIAGNOSIS — R109 Unspecified abdominal pain: Secondary | ICD-10-CM | POA: Diagnosis not present

## 2015-05-30 DIAGNOSIS — M791 Myalgia: Secondary | ICD-10-CM | POA: Diagnosis not present

## 2015-05-30 DIAGNOSIS — Z8719 Personal history of other diseases of the digestive system: Secondary | ICD-10-CM | POA: Insufficient documentation

## 2015-05-30 DIAGNOSIS — R55 Syncope and collapse: Secondary | ICD-10-CM | POA: Insufficient documentation

## 2015-05-30 DIAGNOSIS — R0789 Other chest pain: Secondary | ICD-10-CM | POA: Diagnosis not present

## 2015-05-30 DIAGNOSIS — S0990XA Unspecified injury of head, initial encounter: Secondary | ICD-10-CM | POA: Diagnosis not present

## 2015-05-30 DIAGNOSIS — M79661 Pain in right lower leg: Secondary | ICD-10-CM | POA: Diagnosis not present

## 2015-05-30 HISTORY — DX: Disorder of kidney and ureter, unspecified: N28.9

## 2015-05-30 LAB — I-STAT CHEM 8, ED
BUN: 14 mg/dL (ref 6–20)
Calcium, Ion: 1.16 mmol/L (ref 1.13–1.30)
Chloride: 104 mmol/L (ref 101–111)
Creatinine, Ser: 0.9 mg/dL (ref 0.44–1.00)
Glucose, Bld: 100 mg/dL — ABNORMAL HIGH (ref 65–99)
HCT: 42 % (ref 36.0–46.0)
Hemoglobin: 14.3 g/dL (ref 12.0–15.0)
Potassium: 4.2 mmol/L (ref 3.5–5.1)
Sodium: 141 mmol/L (ref 135–145)
TCO2: 25 mmol/L (ref 0–100)

## 2015-05-30 LAB — CBC
HCT: 39.6 % (ref 36.0–46.0)
Hemoglobin: 13.2 g/dL (ref 12.0–15.0)
MCH: 30.5 pg (ref 26.0–34.0)
MCHC: 33.3 g/dL (ref 30.0–36.0)
MCV: 91.5 fL (ref 78.0–100.0)
Platelets: 151 10*3/uL (ref 150–400)
RBC: 4.33 MIL/uL (ref 3.87–5.11)
RDW: 13.1 % (ref 11.5–15.5)
WBC: 4.1 10*3/uL (ref 4.0–10.5)

## 2015-05-30 LAB — COMPREHENSIVE METABOLIC PANEL
ALT: 18 U/L (ref 14–54)
AST: 40 U/L (ref 15–41)
Albumin: 3.6 g/dL (ref 3.5–5.0)
Alkaline Phosphatase: 57 U/L (ref 38–126)
Anion gap: 8 (ref 5–15)
BUN: 10 mg/dL (ref 6–20)
CO2: 25 mmol/L (ref 22–32)
Calcium: 9.5 mg/dL (ref 8.9–10.3)
Chloride: 107 mmol/L (ref 101–111)
Creatinine, Ser: 0.77 mg/dL (ref 0.44–1.00)
GFR calc Af Amer: >60 mL/min (ref >60)
GFR calc non Af Amer: >60 mL/min (ref >60)
Glucose, Bld: 106 mg/dL — ABNORMAL HIGH (ref 65–99)
Potassium: 4.2 mmol/L (ref 3.5–5.1)
Sodium: 140 mmol/L (ref 135–145)
Total Bilirubin: 1.1 mg/dL (ref 0.3–1.2)
Total Protein: 6.7 g/dL (ref 6.5–8.1)

## 2015-05-30 LAB — SAMPLE TO BLOOD BANK

## 2015-05-30 LAB — PROTIME-INR
INR: 1.09 (ref 0.00–1.49)
Prothrombin Time: 14.3 seconds (ref 11.6–15.2)

## 2015-05-30 LAB — I-STAT TROPONIN, ED: Troponin i, poc: 0 ng/mL (ref 0.00–0.08)

## 2015-05-30 LAB — ETHANOL: Alcohol, Ethyl (B): <5 mg/dL (ref <5)

## 2015-05-30 LAB — I-STAT CG4 LACTIC ACID, ED: Lactic Acid, Venous: 1.62 mmol/L (ref 0.5–2.0)

## 2015-05-30 MED ORDER — ONDANSETRON 4 MG PO TBDP: 4.0000 mg | ORAL_TABLET | Freq: Three times a day (TID) | ORAL | Status: DC | PRN

## 2015-05-30 MED ORDER — HYDROMORPHONE HCL 1 MG/ML IJ SOLN
INTRAMUSCULAR | Status: AC
  Administered 2015-05-30: 1 mg
  Filled 2015-05-30: qty 1

## 2015-05-30 MED ORDER — HYDROMORPHONE HCL 1 MG/ML IJ SOLN
1.0000 mg | Freq: Once | INTRAMUSCULAR | Status: AC
  Administered 2015-05-30: 1 mg via INTRAVENOUS
  Filled 2015-05-30: qty 1

## 2015-05-30 MED ORDER — CYCLOBENZAPRINE HCL 10 MG PO TABS: 10.0000 mg | ORAL_TABLET | Freq: Two times a day (BID) | ORAL | Status: DC | PRN

## 2015-05-30 MED ORDER — HYDROCODONE-ACETAMINOPHEN 5-325 MG PO TABS: 1.0000 | ORAL_TABLET | Freq: Four times a day (QID) | ORAL | Status: DC | PRN

## 2015-05-30 MED ORDER — ONDANSETRON 4 MG PO TBDP
8.0000 mg | ORAL_TABLET | Freq: Once | ORAL | Status: AC
  Administered 2015-05-30: 8 mg via ORAL
  Filled 2015-05-30: qty 2
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
## 2015-05-30 NOTE — Discharge Instructions (Signed)

## 2015-05-30 NOTE — Telephone Encounter (Signed)
Faxed signed paperwork to Dennison Nancy at [facility] for pt to be able to go on a trip.  Mailed original back to patient and sent a copy to HIM to scan.

## 2015-05-30 NOTE — ED Notes (Signed)
Pt vomited once while transferring from bed to wheelchair.  Robina Ade MD made aware and is at bedside with pt.  V/O for 8mg  Zofran ODT and then continue with discharge of pt.
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
## 2015-06-02 ENCOUNTER — Encounter (HOSPITAL_COMMUNITY): Payer: Self-pay | Admitting: Emergency Medicine

## 2015-06-10 DIAGNOSIS — M542 Cervicalgia: Secondary | ICD-10-CM | POA: Diagnosis not present

## 2015-06-10 DIAGNOSIS — M25561 Pain in right knee: Secondary | ICD-10-CM | POA: Diagnosis not present

## 2015-06-12 ENCOUNTER — Ambulatory Visit
Admission: RE | Admit: 2015-06-12 | Discharge: 2015-06-12 | Disposition: A | Discharge status: Home / Self Care | Payer: Commercial Managed Care - HMO | Source: Ambulatory Visit | Attending: Nurse Practitioner | Admitting: Nurse Practitioner

## 2015-06-12 DIAGNOSIS — R928 Other abnormal and inconclusive findings on diagnostic imaging of breast: Secondary | ICD-10-CM | POA: Diagnosis not present

## 2015-06-12 DIAGNOSIS — Z779 Other contact with and (suspected) exposures hazardous to health: Secondary | ICD-10-CM | POA: Diagnosis not present

## 2015-06-12 DIAGNOSIS — Z853 Personal history of malignant neoplasm of breast: Secondary | ICD-10-CM

## 2015-06-23 DIAGNOSIS — I10 Essential (primary) hypertension: Secondary | ICD-10-CM | POA: Diagnosis not present

## 2015-06-23 DIAGNOSIS — R05 Cough: Secondary | ICD-10-CM | POA: Diagnosis not present

## 2015-06-23 DIAGNOSIS — H6123 Impacted cerumen, bilateral: Secondary | ICD-10-CM | POA: Diagnosis not present

## 2015-06-23 DIAGNOSIS — M797 Fibromyalgia: Secondary | ICD-10-CM | POA: Diagnosis not present

## 2015-06-23 DIAGNOSIS — M329 Systemic lupus erythematosus, unspecified: Secondary | ICD-10-CM | POA: Diagnosis not present

## 2015-06-23 DIAGNOSIS — Z23 Encounter for immunization: Secondary | ICD-10-CM | POA: Diagnosis not present

## 2015-07-21 ENCOUNTER — Encounter: Payer: Self-pay | Admitting: Nurse Practitioner

## 2015-07-21 ENCOUNTER — Ambulatory Visit (HOSPITAL_BASED_OUTPATIENT_CLINIC_OR_DEPARTMENT_OTHER): Payer: Commercial Managed Care - HMO | Admitting: Nurse Practitioner

## 2015-07-21 ENCOUNTER — Telehealth: Payer: Self-pay | Admitting: Nurse Practitioner

## 2015-07-21 ENCOUNTER — Other Ambulatory Visit (HOSPITAL_BASED_OUTPATIENT_CLINIC_OR_DEPARTMENT_OTHER): Payer: Commercial Managed Care - HMO

## 2015-07-21 VITALS — BP 155/69 | HR 70 | Temp 98.4°F | Resp 18 | Ht 65.0 in | Wt 201.8 lb

## 2015-07-21 DIAGNOSIS — C50411 Malignant neoplasm of upper-outer quadrant of right female breast: Secondary | ICD-10-CM

## 2015-07-21 DIAGNOSIS — C50911 Malignant neoplasm of unspecified site of right female breast: Secondary | ICD-10-CM

## 2015-07-21 DIAGNOSIS — R12 Heartburn: Secondary | ICD-10-CM

## 2015-07-21 DIAGNOSIS — Z79811 Long term (current) use of aromatase inhibitors: Secondary | ICD-10-CM

## 2015-07-21 DIAGNOSIS — R350 Frequency of micturition: Secondary | ICD-10-CM

## 2015-07-21 DIAGNOSIS — R1310 Dysphagia, unspecified: Secondary | ICD-10-CM

## 2015-07-21 LAB — CBC WITH DIFFERENTIAL/PLATELET
BASO%: 0.6 % (ref 0.0–2.0)
Basophils Absolute: 0 10*3/uL (ref 0.0–0.1)
EOS%: 3.2 % (ref 0.0–7.0)
Eosinophils Absolute: 0.1 10*3/uL (ref 0.0–0.5)
HCT: 39.5 % (ref 34.8–46.6)
HGB: 13.1 g/dL (ref 11.6–15.9)
LYMPH%: 40.2 % (ref 14.0–49.7)
MCH: 30.5 pg (ref 25.1–34.0)
MCHC: 33.1 g/dL (ref 31.5–36.0)
MCV: 92 fL (ref 79.5–101.0)
MONO#: 0.5 10*3/uL (ref 0.1–0.9)
MONO%: 12.8 % (ref 0.0–14.0)
NEUT#: 1.8 10*3/uL (ref 1.5–6.5)
NEUT%: 43.2 % (ref 38.4–76.8)
Platelets: 186 10*3/uL (ref 145–400)
RBC: 4.29 10*6/uL (ref 3.70–5.45)
RDW: 13.2 % (ref 11.2–14.5)
WBC: 4.2 10*3/uL (ref 3.9–10.3)
lymph#: 1.7 10*3/uL (ref 0.9–3.3)

## 2015-07-21 LAB — COMPREHENSIVE METABOLIC PANEL (CC13)
ALT: 17 U/L (ref 0–55)
AST: 22 U/L (ref 5–34)
Albumin: 3.7 g/dL (ref 3.5–5.0)
Alkaline Phosphatase: 69 U/L (ref 40–150)
Anion Gap: 5 mEq/L (ref 3–11)
BUN: 9.4 mg/dL (ref 7.0–26.0)
CO2: 30 mEq/L — ABNORMAL HIGH (ref 22–29)
Calcium: 9.7 mg/dL (ref 8.4–10.4)
Chloride: 106 mEq/L (ref 98–109)
Creatinine: 0.8 mg/dL (ref 0.6–1.1)
EGFR: >90 mL/min/{1.73_m2} (ref >90)
Glucose: 89 mg/dl (ref 70–140)
Potassium: 3.3 mEq/L — ABNORMAL LOW (ref 3.5–5.1)
Sodium: 142 mEq/L (ref 136–145)
Total Bilirubin: 0.51 mg/dL (ref 0.20–1.20)
Total Protein: 6.9 g/dL (ref 6.4–8.3)

## 2015-07-21 LAB — URINALYSIS, MICROSCOPIC - CHCC
Bilirubin (Urine): NEGATIVE
Glucose: NEGATIVE mg/dL
Ketones: NEGATIVE mg/dL
Nitrite: NEGATIVE
Protein: NEGATIVE mg/dL
RBC / HPF: NEGATIVE (ref 0–2)
Specific Gravity, Urine: 1.01 (ref 1.003–1.035)
Urobilinogen, UR: 0.2 mg/dL (ref 0.2–1)
pH: 7.5 (ref 4.6–8.0)

## 2015-07-21 MED ORDER — CIPROFLOXACIN HCL 500 MG PO TABS: 500.0000 mg | ORAL_TABLET | Freq: Two times a day (BID) | ORAL | Status: DC

## 2015-07-21 MED ORDER — OMEPRAZOLE 40 MG PO CPDR
40.0000 mg | DELAYED_RELEASE_CAPSULE | Freq: Every day | ORAL | Status: DC
Start: 2015-07-21 — End: 2018-02-17
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
## 2015-07-21 NOTE — Telephone Encounter (Signed)
Appointments made and avs printed for patient °

## 2015-11-24 DIAGNOSIS — J40 Bronchitis, not specified as acute or chronic: Secondary | ICD-10-CM | POA: Diagnosis not present

## 2015-11-24 DIAGNOSIS — Z888 Allergy status to other drugs, medicaments and biological substances status: Secondary | ICD-10-CM | POA: Diagnosis not present

## 2015-11-24 DIAGNOSIS — Z853 Personal history of malignant neoplasm of breast: Secondary | ICD-10-CM | POA: Diagnosis not present

## 2015-11-24 DIAGNOSIS — R079 Chest pain, unspecified: Secondary | ICD-10-CM | POA: Diagnosis not present

## 2015-11-24 DIAGNOSIS — J441 Chronic obstructive pulmonary disease with (acute) exacerbation: Secondary | ICD-10-CM | POA: Diagnosis not present

## 2015-11-24 DIAGNOSIS — R05 Cough: Secondary | ICD-10-CM | POA: Diagnosis not present

## 2015-11-24 DIAGNOSIS — M329 Systemic lupus erythematosus, unspecified: Secondary | ICD-10-CM | POA: Diagnosis not present

## 2015-11-24 DIAGNOSIS — R0789 Other chest pain: Secondary | ICD-10-CM | POA: Diagnosis not present

## 2015-11-24 DIAGNOSIS — R509 Fever, unspecified: Secondary | ICD-10-CM | POA: Diagnosis not present

## 2015-11-24 DIAGNOSIS — Z880 Allergy status to penicillin: Secondary | ICD-10-CM | POA: Diagnosis not present

## 2015-11-24 DIAGNOSIS — I10 Essential (primary) hypertension: Secondary | ICD-10-CM | POA: Diagnosis not present

## 2015-11-24 DIAGNOSIS — J123 Human metapneumovirus pneumonia: Secondary | ICD-10-CM | POA: Diagnosis not present

## 2015-11-24 DIAGNOSIS — R0602 Shortness of breath: Secondary | ICD-10-CM | POA: Diagnosis not present

## 2015-11-24 DIAGNOSIS — A4189 Other specified sepsis: Secondary | ICD-10-CM | POA: Diagnosis not present

## 2015-11-24 DIAGNOSIS — B9781 Human metapneumovirus as the cause of diseases classified elsewhere: Secondary | ICD-10-CM | POA: Diagnosis not present

## 2015-12-04 DIAGNOSIS — R0602 Shortness of breath: Secondary | ICD-10-CM | POA: Diagnosis not present

## 2015-12-04 DIAGNOSIS — Z853 Personal history of malignant neoplasm of breast: Secondary | ICD-10-CM | POA: Diagnosis not present

## 2015-12-04 DIAGNOSIS — R05 Cough: Secondary | ICD-10-CM | POA: Diagnosis not present

## 2015-12-04 DIAGNOSIS — Z880 Allergy status to penicillin: Secondary | ICD-10-CM | POA: Diagnosis not present

## 2015-12-04 DIAGNOSIS — Z888 Allergy status to other drugs, medicaments and biological substances status: Secondary | ICD-10-CM | POA: Diagnosis not present

## 2015-12-04 DIAGNOSIS — J45901 Unspecified asthma with (acute) exacerbation: Secondary | ICD-10-CM | POA: Diagnosis not present

## 2015-12-04 DIAGNOSIS — C50912 Malignant neoplasm of unspecified site of left female breast: Secondary | ICD-10-CM | POA: Diagnosis not present

## 2015-12-04 DIAGNOSIS — Z91040 Latex allergy status: Secondary | ICD-10-CM | POA: Diagnosis not present

## 2015-12-04 DIAGNOSIS — J159 Unspecified bacterial pneumonia: Secondary | ICD-10-CM | POA: Diagnosis not present

## 2015-12-04 DIAGNOSIS — Z881 Allergy status to other antibiotic agents status: Secondary | ICD-10-CM | POA: Diagnosis not present

## 2015-12-04 DIAGNOSIS — J129 Viral pneumonia, unspecified: Secondary | ICD-10-CM | POA: Diagnosis not present

## 2015-12-04 DIAGNOSIS — B029 Zoster without complications: Secondary | ICD-10-CM | POA: Diagnosis not present

## 2015-12-04 DIAGNOSIS — J181 Lobar pneumonia, unspecified organism: Secondary | ICD-10-CM | POA: Diagnosis not present

## 2015-12-04 DIAGNOSIS — J189 Pneumonia, unspecified organism: Secondary | ICD-10-CM | POA: Diagnosis not present

## 2015-12-04 DIAGNOSIS — R062 Wheezing: Secondary | ICD-10-CM | POA: Diagnosis not present

## 2015-12-04 DIAGNOSIS — J4541 Moderate persistent asthma with (acute) exacerbation: Secondary | ICD-10-CM | POA: Diagnosis not present

## 2015-12-04 DIAGNOSIS — R531 Weakness: Secondary | ICD-10-CM | POA: Diagnosis not present

## 2015-12-04 DIAGNOSIS — I10 Essential (primary) hypertension: Secondary | ICD-10-CM | POA: Diagnosis not present

## 2015-12-29 DIAGNOSIS — J189 Pneumonia, unspecified organism: Secondary | ICD-10-CM | POA: Diagnosis not present

## 2015-12-29 DIAGNOSIS — M797 Fibromyalgia: Secondary | ICD-10-CM | POA: Diagnosis not present

## 2015-12-29 DIAGNOSIS — M06251 Rheumatoid bursitis, right hip: Secondary | ICD-10-CM | POA: Diagnosis not present

## 2015-12-29 DIAGNOSIS — R1310 Dysphagia, unspecified: Secondary | ICD-10-CM | POA: Diagnosis not present

## 2016-01-14 DIAGNOSIS — R7309 Other abnormal glucose: Secondary | ICD-10-CM | POA: Diagnosis not present

## 2016-01-14 DIAGNOSIS — Z79899 Other long term (current) drug therapy: Secondary | ICD-10-CM | POA: Diagnosis not present

## 2016-01-14 DIAGNOSIS — Z0000 Encounter for general adult medical examination without abnormal findings: Secondary | ICD-10-CM | POA: Diagnosis not present

## 2016-01-14 DIAGNOSIS — N63 Unspecified lump in breast: Secondary | ICD-10-CM | POA: Diagnosis not present

## 2016-01-14 DIAGNOSIS — B379 Candidiasis, unspecified: Secondary | ICD-10-CM | POA: Diagnosis not present

## 2016-01-21 ENCOUNTER — Other Ambulatory Visit: Payer: Self-pay | Admitting: Nurse Practitioner

## 2016-01-21 DIAGNOSIS — N63 Unspecified lump in unspecified breast: Secondary | ICD-10-CM

## 2016-01-23 ENCOUNTER — Other Ambulatory Visit: Payer: Self-pay

## 2016-01-23 DIAGNOSIS — C50911 Malignant neoplasm of unspecified site of right female breast: Secondary | ICD-10-CM

## 2016-01-26 ENCOUNTER — Ambulatory Visit
Admission: RE | Admit: 2016-01-26 | Discharge: 2016-01-26 | Disposition: A | Discharge status: Home / Self Care | Payer: Commercial Managed Care - HMO | Source: Ambulatory Visit | Attending: Internal Medicine | Admitting: Internal Medicine

## 2016-01-26 ENCOUNTER — Ambulatory Visit (HOSPITAL_BASED_OUTPATIENT_CLINIC_OR_DEPARTMENT_OTHER): Payer: Commercial Managed Care - HMO | Admitting: Oncology

## 2016-01-26 ENCOUNTER — Other Ambulatory Visit (HOSPITAL_BASED_OUTPATIENT_CLINIC_OR_DEPARTMENT_OTHER): Payer: Commercial Managed Care - HMO

## 2016-01-26 ENCOUNTER — Other Ambulatory Visit: Payer: Self-pay | Admitting: *Deleted

## 2016-01-26 ENCOUNTER — Telehealth: Payer: Self-pay | Admitting: Oncology

## 2016-01-26 VITALS — BP 146/68 | HR 70 | Temp 98.7°F | Resp 18 | Ht 65.0 in | Wt 195.1 lb

## 2016-01-26 DIAGNOSIS — C50912 Malignant neoplasm of unspecified site of left female breast: Secondary | ICD-10-CM

## 2016-01-26 DIAGNOSIS — N63 Unspecified lump in unspecified breast: Secondary | ICD-10-CM

## 2016-01-26 DIAGNOSIS — Z79811 Long term (current) use of aromatase inhibitors: Secondary | ICD-10-CM | POA: Diagnosis not present

## 2016-01-26 DIAGNOSIS — C50911 Malignant neoplasm of unspecified site of right female breast: Secondary | ICD-10-CM

## 2016-01-26 DIAGNOSIS — R928 Other abnormal and inconclusive findings on diagnostic imaging of breast: Secondary | ICD-10-CM | POA: Diagnosis not present

## 2016-01-26 LAB — COMPREHENSIVE METABOLIC PANEL
ALT: 18 U/L (ref 0–55)
AST: 22 U/L (ref 5–34)
Albumin: 3.7 g/dL (ref 3.5–5.0)
Alkaline Phosphatase: 64 U/L (ref 40–150)
Anion Gap: 9 mEq/L (ref 3–11)
BUN: 11.2 mg/dL (ref 7.0–26.0)
CO2: 26 mEq/L (ref 22–29)
Calcium: 9.7 mg/dL (ref 8.4–10.4)
Chloride: 107 mEq/L (ref 98–109)
Creatinine: 0.8 mg/dL (ref 0.6–1.1)
EGFR: 83 mL/min/{1.73_m2} — ABNORMAL LOW (ref >90)
Glucose: 84 mg/dl (ref 70–140)
Potassium: 3.2 mEq/L — ABNORMAL LOW (ref 3.5–5.1)
Sodium: 142 mEq/L (ref 136–145)
Total Bilirubin: 0.38 mg/dL (ref 0.20–1.20)
Total Protein: 7.2 g/dL (ref 6.4–8.3)

## 2016-01-26 LAB — CBC WITH DIFFERENTIAL/PLATELET
BASO%: 0.4 % (ref 0.0–2.0)
Basophils Absolute: 0 10*3/uL (ref 0.0–0.1)
EOS%: 2.8 % (ref 0.0–7.0)
Eosinophils Absolute: 0.1 10*3/uL (ref 0.0–0.5)
HCT: 40 % (ref 34.8–46.6)
HGB: 13.1 g/dL (ref 11.6–15.9)
LYMPH%: 42.9 % (ref 14.0–49.7)
MCH: 30.6 pg (ref 25.1–34.0)
MCHC: 32.7 g/dL (ref 31.5–36.0)
MCV: 93.7 fL (ref 79.5–101.0)
MONO#: 0.5 10*3/uL (ref 0.1–0.9)
MONO%: 10.8 % (ref 0.0–14.0)
NEUT#: 1.9 10*3/uL (ref 1.5–6.5)
NEUT%: 43.1 % (ref 38.4–76.8)
Platelets: 195 10*3/uL (ref 145–400)
RBC: 4.27 10*6/uL (ref 3.70–5.45)
RDW: 13.3 % (ref 11.2–14.5)
WBC: 4.4 10*3/uL (ref 3.9–10.3)
lymph#: 1.9 10*3/uL (ref 0.9–3.3)

## 2016-01-26 MED ORDER — TAMOXIFEN CITRATE 20 MG PO TABS: 20.0000 mg | ORAL_TABLET | Freq: Every day | ORAL | Status: DC

## 2016-01-26 NOTE — Telephone Encounter (Signed)
appt made and avs printed °

## 2016-01-26 NOTE — Progress Notes (Signed)
[name]  Telephone:(336) [phone] Fax:(336) [phone]     ID: [name] DOB: 06-10-1951  MR#: [id number]  HUD#:[id number]  Patient Care Team: Jaclyn Chard, MD as PCP - General (Internal Medicine) Jaclyn Pound, MD as Consulting Physician (Rheumatology) PCP: Jaclyn Greenland, MD GYN: SU:  OTHER MD: Jaclyn Koh MD  CHIEF COMPLAINT: Estrogen receptor positive breast cancer  CURRENT TREATMENT: tamoxifen    BREAST CANCER HISTORY: From Dr. Dana Nguyen earlier summary:  "#1 patient is status post lumpectomy with sentinel lymph node biopsy followed by radiation to the right breast. In December 2009 patient began aromatase inhibitor but could not tolerate it.  #2 therefore she began in March 2010 tamoxifen 20 mg daily. However she has experienced significant amount of aches and pains and in December 2012 discontinued it. At this time she does not want to go back on any kind of antiestrogen therapy due to side effects.  #3 patient would like to go back on tamoxifen 20 mg daily. She recently attended finding your new normal seminar. And because of that she wants to she would like to go back on the tamoxifen. We discussed side effects again. She was sent to her pharmacy."  Her subsequent history is as detailed below  INTERVAL HISTORY: [name] returns today for follow up of her estrogen receptor positive breast cancer.  She continues on tamoxifen, with good tolerance. She obtains a drug at a good price.   She recently visited [place] where she developed a viral pneumonia, with the metastases and pneumonia virus. This put her in the emergency room twice. She required breathing treatments. Luckily all that has resolved.  While she was there her physician palpated a lump in her right breast. She wanted that reviewed when she came to [facility] and she saw primary care, who also was able to palpate the lump. She was set up for repeat mammography which was just done  and which was benign. She says the lump "comes and goes" and right now" "gone".  REVIEW OF SYSTEMS:  Aside from these issues, [name] still has some gastritis, and some joint pain which is not more persistent or intense than before. Her lupus is well-controlled and now that she is doing some weights it's significantly improved symptomatically. She has also lost about 20 pounds by dieting and exercising. A detailed review of systems today was otherwise stable  PAST MEDICAL HISTORY: Past Medical History  Diagnosis Date  . Heart murmur     functional murmur, echo in [place]- long time ago   . Blood transfusion     1986- post-partial hysterectomy  . Hepatitis     Hep. A- 1978  . Chronic kidney disease   . GERD (gastroesophageal reflux disease)     recently taken off anti reflux tx  . Arthritis     R knee, hands   . No pertinent past medical history     will see cardiac for review this week, 08/24/2011  . rt breast ca dx'd 12/2007    xrt comp;  . Arthritis   . Cancer ([place])   . Hypertension   . Renal disorder   . Lupus ([place])   . Hepatitis   . Heart murmur     PAST SURGICAL HISTORY: Past Surgical History  Procedure Laterality Date  . Joint replacement      L knee replacement- 2006, arthroscopic surg. both knees   . Mastectomy      R breast- lumpectomy -2009  . Tonsillectomy  1954  . Appendectomy      1978 & tubal ligation   . Abdominal hysterectomy      1986  . Tubal ligation    . Breast surgery      R breast lumpectomy  . Cholecystectomy      1978- open   . Total knee arthroplasty  08/30/2011    Procedure: TOTAL KNEE ARTHROPLASTY;  Surgeon: [name];  Location: [place];  Service: Orthopedics;  Laterality: Right;  Right Total Knee Arthroplasty  . Appendectomy    . Breast surgery    . Cholecystectomy    . Abdominal hysterectomy    . Joint replacement    . Mastectomy    . Tonsillectomy      FAMILY HISTORY Family History  Problem Relation Age of Onset  .  Anesthesia problems Neg Hx   . Hypotension Neg Hx   . Malignant hyperthermia Neg Hx   . Pseudochol deficiency Neg Hx   . Heart attack Father   . Cancer Other   . Hypertension Other    the patient's father died from a heart attack at the age of 74. The patient's mother died at the age of 25. She had a history of breast cancer and one of her sisters was diagnosed with breast cancer at the age of 32. The patient had 5 brothers and 5 sisters. There is no other history of breast or ovarian cancer in the family to her knowledge.  GYNECOLOGIC HISTORY:  No LMP recorded. Patient has had a hysterectomy. Menarche age 85, first live birth age 39. The patient is GX P3. She had a simple hysterectomy in 1986 (no salpingo-oophorectomy.  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 She remarried in 2011. Her husband [name] is retired from [employer]. The patient has one adopted son in addition to her own children. 3 of her children live in town 1 in [place]. She has 10 grandchildren. She attends [employer] locally    ADVANCED DIRECTIVES: Not in place   HEALTH MAINTENANCE: Social History  Substance Use Topics  . Smoking status: Never Smoker   . Smokeless tobacco: Not on file  . Alcohol Use: No     Colonoscopy:  PAP:  Bone density: 04/30/2008 was normal  Lipid panel:  Allergies  Allergen Reactions  . Iodine Anaphylaxis  . Iohexol Anaphylaxis     Code: HIVES, Desc: PT IS ALLERGIC TO IODINATED CONTRAST; REACTION INCLUDES FACIAL SWELLING,HIVES,RESPIRATORY DISTRESS;PT HASN'T HAD SCAN W/PREMEDS.;REFUSED CONTRAST OF ANY KIND!  KR   . Latex Anaphylaxis  . Penicillins Anaphylaxis  . Penicillins Rash  . Tetracycline Anaphylaxis  . Iodine   . Milk-Related Compounds     Lactose intolerant    Current Outpatient Prescriptions  Medication Sig Dispense Refill  . Cholecalciferol (VITAMIN D3) 2000 UNITS TABS Take 1 tablet by  mouth every morning.     . ciprofloxacin (CIPRO) 500 MG tablet Take 1 tablet (500 mg total) by mouth 2 (two) times daily. 10 tablet 0  . Ginkgo Biloba 100 MG CAPS Take by mouth.    . hydrochlorothiazide (HYDRODIURIL) 25 MG tablet Take 25 mg by mouth every morning.     Marland Kitchen HYDROcodone-acetaminophen (NORCO/VICODIN) 5-325 MG per tablet Take 1 tablet by mouth every 6 (six) hours as needed for severe pain. (Patient not taking: Reported on 07/21/2015) 12 tablet 0  . ibuprofen (ADVIL,MOTRIN) 800 MG tablet Take 1  tablet (800 mg total) by mouth every 8 (eight) hours as needed for moderate pain. (Patient not taking: Reported on 07/21/2015) 30 tablet 0  . metoprolol succinate (TOPROL-XL) 25 MG 24 hr tablet Take 50 mg by mouth every morning.    Marland Kitchen omeprazole (PRILOSEC) 40 MG capsule Take 1 capsule (40 mg total) by mouth daily. 30 capsule 5  . pantoprazole (PROTONIX) 40 MG tablet Take 1 tablet (40 mg total) by mouth daily. 30 tablet 0  . tamoxifen (NOLVADEX) 20 MG tablet Take 1 tablet (20 mg total) by mouth daily. 90 tablet 12  . zolpidem (AMBIEN) 5 MG tablet Take 1 tablet (5 mg total) by mouth at bedtime as needed. 90 tablet 3   No current facility-administered medications for this visit.    OBJECTIVE: Middle-aged African-American woman . In no acute distress Filed Vitals:   01/26/16 1321  BP: 146/68  Pulse: 70  Temp: 98.7 F (37.1 C)  Resp: 18     Body mass index is 32.47 kg/(m^2).    ECOG FS:1 - Symptomatic but completely ambulatory   Sclerae unicteric, pupils round and equal Oropharynx clear and moist-- no thrush or other lesions No cervical or supraclavicular adenopathy Lungs no rales or rhonchi Heart regular rate and rhythm Abd soft, nontender, positive bowel sounds MSK no focal spinal tenderness, no upper extremity lymphedema Neuro: nonfocal, well oriented, appropriate affect Breasts:  The right breast is status post lumpectomy and radiation. There is no evidence of local recurrence. The  "disappearing lump" fields to me like scar tissue right under the incision line.  The right axilla is benign. The left breast is unremarkable cancer   LAB RESULTS:  CMP     Component Value Date/Time   NA 142 01/26/2016 1225   NA 141 05/30/2015 1237   NA 136 03/13/2010 1158   K 3.2* 01/26/2016 1225   K 4.2 05/30/2015 1237   K 3.9 03/13/2010 1158   CL 104 05/30/2015 1237   CL 102 01/25/2013 0909   CL 101 03/13/2010 1158   CO2 26 01/26/2016 1225   CO2 25 05/30/2015 1225   CO2 30 03/13/2010 1158   GLUCOSE 84 01/26/2016 1225   GLUCOSE 100* 05/30/2015 1237   GLUCOSE 81 01/25/2013 0909   GLUCOSE 77 03/13/2010 1158   BUN 11.2 01/26/2016 1225   BUN 14 05/30/2015 1237   BUN 14 03/13/2010 1158   CREATININE 0.8 01/26/2016 1225   CREATININE 0.90 05/30/2015 1237   CREATININE 0.8 03/13/2010 1158   CALCIUM 9.7 01/26/2016 1225   CALCIUM 9.5 05/30/2015 1225   CALCIUM 9.7 03/13/2010 1158   PROT 7.2 01/26/2016 1225   PROT 6.7 05/30/2015 1225   PROT 7.1 03/13/2010 1158   ALBUMIN 3.7 01/26/2016 1225   ALBUMIN 3.6 05/30/2015 1225   ALBUMIN 3.8 03/13/2010 1158   AST 22 01/26/2016 1225   AST 40 05/30/2015 1225   AST 31 03/13/2010 1158   ALT 18 01/26/2016 1225   ALT 18 05/30/2015 1225   ALT 32 03/13/2010 1158   ALKPHOS 64 01/26/2016 1225   ALKPHOS 57 05/30/2015 1225   ALKPHOS 65 03/13/2010 1158   BILITOT 0.38 01/26/2016 1225   BILITOT 1.1 05/30/2015 1225   BILITOT 0.50 03/13/2010 1158   GFRNONAA >60 05/30/2015 1225   GFRAA >60 05/30/2015 1225    INo results found for: SPEP, UPEP  Lab Results  Component Value Date   WBC 4.4 01/26/2016   NEUTROABS 1.9 01/26/2016   HGB 13.1 01/26/2016   HCT 40.0  01/26/2016   MCV 93.7 01/26/2016   PLT 195 01/26/2016      Chemistry      Component Value Date/Time   NA 142 01/26/2016 1225   NA 141 05/30/2015 1237   NA 136 03/13/2010 1158   K 3.2* 01/26/2016 1225   K 4.2 05/30/2015 1237   K 3.9 03/13/2010 1158   CL 104 05/30/2015 1237   CL  102 01/25/2013 0909   CL 101 03/13/2010 1158   CO2 26 01/26/2016 1225   CO2 25 05/30/2015 1225   CO2 30 03/13/2010 1158   BUN 11.2 01/26/2016 1225   BUN 14 05/30/2015 1237   BUN 14 03/13/2010 1158   CREATININE 0.8 01/26/2016 1225   CREATININE 0.90 05/30/2015 1237   CREATININE 0.8 03/13/2010 1158      Component Value Date/Time   CALCIUM 9.7 01/26/2016 1225   CALCIUM 9.5 05/30/2015 1225   CALCIUM 9.7 03/13/2010 1158   ALKPHOS 64 01/26/2016 1225   ALKPHOS 57 05/30/2015 1225   ALKPHOS 65 03/13/2010 1158   AST 22 01/26/2016 1225   AST 40 05/30/2015 1225   AST 31 03/13/2010 1158   ALT 18 01/26/2016 1225   ALT 18 05/30/2015 1225   ALT 32 03/13/2010 1158   BILITOT 0.38 01/26/2016 1225   BILITOT 1.1 05/30/2015 1225   BILITOT 0.50 03/13/2010 1158       Lab Results  Component Value Date   LABCA2 16 11/09/2010    No components found for: [id number]  No results for input(s): INR in the last 168 hours.  Urinalysis    Component Value Date/Time   COLORURINE YELLOW 10/07/2014 [place] 10/07/2014 1605   LABSPEC 1.010 07/21/2015 1214   LABSPEC 1.015 10/07/2014 1605   PHURINE 7.5 07/21/2015 1214   PHURINE 6.0 10/07/2014 1605   GLUCOSEU Negative 07/21/2015 1214   GLUCOSEU NEGATIVE 10/07/2014 1605   HGBUR Trace 07/21/2015 1214   HGBUR TRACE* 10/07/2014 1605   HGBUR negative 01/22/2010 1551   BILIRUBINUR Negative 07/21/2015 1214   BILIRUBINUR NEGATIVE 10/07/2014 1605   KETONESUR Negative 07/21/2015 [place] 10/07/2014 1605   PROTEINUR Negative 07/21/2015 1214   PROTEINUR NEGATIVE 10/07/2014 1605   UROBILINOGEN 0.2 07/21/2015 1214   UROBILINOGEN 0.2 10/07/2014 1605   NITRITE Negative 07/21/2015 1214   NITRITE NEGATIVE 10/07/2014 1605   LEUKOCYTESUR Trace 07/21/2015 1214   LEUKOCYTESUR NEGATIVE 10/07/2014 1605    STUDIES: Mm Diag Breast Tomo Uni Right  01/26/2016  CLINICAL DATA:  Patient reports having felt a lump in the region of the  lumpectomy scar while she was of state. This was subsequently followed by her referring clinician, but patient does not feel this currently. EXAM: 2D DIGITAL DIAGNOSTIC UNILATERAL RIGHT MAMMOGRAM WITH CAD AND ADJUNCT TOMO COMPARISON:  Previous exam(s). ACR Breast Density Category b: There are scattered areas of fibroglandular density. FINDINGS: The focal density and architectural distortion upper right breast reflecting the lumpectomy scar is stable. There is a stable biopsy clip adjacent to this. There are no other areas of architectural distortion. There are no discrete masses. There are no suspicious calcifications. Mammographic images were processed with CAD. IMPRESSION: No evidence of recurrent or new breast malignancy. Stable benign postsurgical changes upper right breast. RECOMMENDATION: Screening mammogram in September 2017 per normal screening schedule.(Code:[id number]-B-01Y) I have discussed the findings and recommendations with the patient. Results were also provided in writing at the conclusion of the visit. If applicable, a reminder letter will be sent to the patient  regarding the next appointment. BI-RADS CATEGORY  2: Benign. Electronically Signed   By: Lajean Manes M.D.   On: 01/26/2016 10:06     ASSESSMENT: 65 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  (2) status post right lumpectomy 03/20/2008 for invasive lobular carcinoma, multifocal, pT1a pNX, stage IA, grade 2, strongly estrogen and progesterone receptor positive, HER-2 negative. with negative margins.  (3) status post right axillary sentinel lymph node sampling 04/17/2008, the single sentinel lymph node being clear  (4) Oncotype score of 10 predicts an outside the breast risk of recurrence of 7% if the patient's only systemic therapy is tamoxifen for 5 years.  (5)  Additional right breast biopsy 05/23/2008 to evaluate microcalcifications seen on pre-radiotherapy mammography showed only atypical lobular hyperplasia  (6) Completed radiation to the right breast 07/26/2008 (50 gray +14 gray boost).  (7) Started on anastrozole November 2009, with poor tolerance  (8). Started on tamoxifen March 2010, continued to December 2012, resumed December 2014   (a)  The plan is to continue tamoxifen  (9) status post simple hysterectomy 1986 (no salpingo-oophorectomy)  PLAN: Jakyrah  Is now 8 years out from definitive surgery for her breast cancer with no evidence of disease recurrence. This is very favorable.  She will complete 5 years of tamoxifen, with some interruptions, this winter. However she remains very anxious about the possibility of  Breast cancer recurring and she would like to continue tamoxifen for total of 10 years. She is tolerating it so well and she is having no side effects from it that she is aware of, I think this is reasonable area  We reviewed her recent mammogram, which is very reassuring. I feel very confident that what we are feeling right under her incision is scar tissue this requires only follow-up.  I am setting her up to see our  Breast survivorship nurse practitioner in  October. Earlene  Will return to see me in April 2018.  She knows to call for any problems that may develop before that visit.   Chauncey Cruel, MD   01/26/2016 1:30 PM

## 2016-02-09 ENCOUNTER — Other Ambulatory Visit: Payer: Self-pay | Admitting: Oncology

## 2016-02-12 DIAGNOSIS — H5213 Myopia, bilateral: Secondary | ICD-10-CM | POA: Diagnosis not present

## 2016-02-12 DIAGNOSIS — H2513 Age-related nuclear cataract, bilateral: Secondary | ICD-10-CM | POA: Diagnosis not present

## 2016-02-12 DIAGNOSIS — H25013 Cortical age-related cataract, bilateral: Secondary | ICD-10-CM | POA: Diagnosis not present

## 2016-03-16 DIAGNOSIS — Z0389 Encounter for observation for other suspected diseases and conditions ruled out: Secondary | ICD-10-CM | POA: Diagnosis not present

## 2016-03-24 DIAGNOSIS — Z110 Encounter for screening for intestinal infectious diseases: Secondary | ICD-10-CM | POA: Diagnosis not present

## 2016-03-24 DIAGNOSIS — Z1159 Encounter for screening for other viral diseases: Secondary | ICD-10-CM | POA: Diagnosis not present

## 2016-04-19 DIAGNOSIS — N3281 Overactive bladder: Secondary | ICD-10-CM | POA: Diagnosis not present

## 2016-04-19 DIAGNOSIS — M545 Low back pain: Secondary | ICD-10-CM | POA: Diagnosis not present

## 2016-04-19 DIAGNOSIS — Z79899 Other long term (current) drug therapy: Secondary | ICD-10-CM | POA: Diagnosis not present

## 2016-04-19 DIAGNOSIS — M62838 Other muscle spasm: Secondary | ICD-10-CM | POA: Diagnosis not present

## 2016-06-10 ENCOUNTER — Other Ambulatory Visit: Payer: Self-pay

## 2016-06-10 ENCOUNTER — Encounter: Payer: Self-pay | Admitting: Adult Health

## 2016-06-23 ENCOUNTER — Encounter: Payer: Self-pay | Admitting: Nurse Practitioner

## 2016-06-23 ENCOUNTER — Other Ambulatory Visit: Payer: Self-pay

## 2016-07-06 ENCOUNTER — Emergency Department (HOSPITAL_COMMUNITY): Payer: Commercial Managed Care - HMO

## 2016-07-06 ENCOUNTER — Encounter (HOSPITAL_COMMUNITY): Payer: Self-pay | Admitting: *Deleted

## 2016-07-06 ENCOUNTER — Emergency Department (HOSPITAL_COMMUNITY)
Admission: EM | Admit: 2016-07-06 | Discharge: 2016-07-06 | Disposition: A | Discharge status: Home / Self Care | Payer: Commercial Managed Care - HMO | Attending: Emergency Medicine | Admitting: Emergency Medicine

## 2016-07-06 DIAGNOSIS — S7012XA Contusion of left thigh, initial encounter: Secondary | ICD-10-CM | POA: Diagnosis not present

## 2016-07-06 DIAGNOSIS — Y999 Unspecified external cause status: Secondary | ICD-10-CM | POA: Insufficient documentation

## 2016-07-06 DIAGNOSIS — S7002XA Contusion of left hip, initial encounter: Secondary | ICD-10-CM | POA: Insufficient documentation

## 2016-07-06 DIAGNOSIS — R0781 Pleurodynia: Secondary | ICD-10-CM | POA: Diagnosis not present

## 2016-07-06 DIAGNOSIS — Z79899 Other long term (current) drug therapy: Secondary | ICD-10-CM | POA: Diagnosis not present

## 2016-07-06 DIAGNOSIS — R51 Headache: Secondary | ICD-10-CM | POA: Insufficient documentation

## 2016-07-06 DIAGNOSIS — N189 Chronic kidney disease, unspecified: Secondary | ICD-10-CM | POA: Insufficient documentation

## 2016-07-06 DIAGNOSIS — S79912A Unspecified injury of left hip, initial encounter: Secondary | ICD-10-CM | POA: Diagnosis present

## 2016-07-06 DIAGNOSIS — Y929 Unspecified place or not applicable: Secondary | ICD-10-CM | POA: Insufficient documentation

## 2016-07-06 DIAGNOSIS — S20222A Contusion of left back wall of thorax, initial encounter: Secondary | ICD-10-CM | POA: Diagnosis not present

## 2016-07-06 DIAGNOSIS — I129 Hypertensive chronic kidney disease with stage 1 through stage 4 chronic kidney disease, or unspecified chronic kidney disease: Secondary | ICD-10-CM | POA: Insufficient documentation

## 2016-07-06 DIAGNOSIS — Z91040 Latex allergy status: Secondary | ICD-10-CM | POA: Diagnosis not present

## 2016-07-06 DIAGNOSIS — Y9389 Activity, other specified: Secondary | ICD-10-CM | POA: Insufficient documentation

## 2016-07-06 DIAGNOSIS — W01198A Fall on same level from slipping, tripping and stumbling with subsequent striking against other object, initial encounter: Secondary | ICD-10-CM | POA: Insufficient documentation

## 2016-07-06 DIAGNOSIS — Z853 Personal history of malignant neoplasm of breast: Secondary | ICD-10-CM | POA: Diagnosis not present

## 2016-07-06 DIAGNOSIS — W19XXXA Unspecified fall, initial encounter: Secondary | ICD-10-CM

## 2016-07-06 MED ORDER — MORPHINE SULFATE (PF) 4 MG/ML IV SOLN
4.0000 mg | Freq: Once | INTRAVENOUS | Status: AC
Start: 2016-07-06 — End: 2016-07-06
  Administered 2016-07-06: 4 mg via INTRAMUSCULAR
  Filled 2016-07-06: qty 1

## 2016-07-06 MED ORDER — DIAZEPAM 5 MG/ML IJ SOLN
5.0000 mg | Freq: Once | INTRAMUSCULAR | Status: AC
  Administered 2016-07-06: 5 mg via INTRAMUSCULAR
  Filled 2016-07-06: qty 2

## 2016-07-06 MED ORDER — CYCLOBENZAPRINE HCL 5 MG PO TABS: 5.0000 mg | ORAL_TABLET | Freq: Three times a day (TID) | ORAL | 0 refills | Status: DC | PRN

## 2016-07-06 MED ORDER — HYDROCODONE-ACETAMINOPHEN 5-325 MG PO TABS: 1.0000 | ORAL_TABLET | Freq: Four times a day (QID) | ORAL | 0 refills | Status: DC | PRN

## 2016-07-06 NOTE — ED Notes (Signed)
YAO MD at bedside.

## 2016-07-06 NOTE — ED Notes (Signed)
Bed: [id number] Expected date:  Expected time:  Means of arrival:  Comments: No monitor

## 2016-07-06 NOTE — ED Triage Notes (Addendum)
Patient slipped and fell in the shower yesterday and hit her head, left shoulder and hip on the tub.  Patient also c/o low back pain.  Patient does not think she lost consciousness and recalls falling and landing on the tub.  Patient is not on blood thinners.  Patient has had a headache since yesterday and it is getting worse.  Patient endorses nausea, but denies diarrhea, vomiting and fever.  Patient also c/o dizziness.  Patient is exceptionally painful to palpation of left posterior shoulder, left low back and left hip. No deformity, crepitus or bruising noted to areas, but exam in triage limited.  Patient denies rib pain.  Lung sounds CTAB.

## 2016-07-06 NOTE — ED Notes (Signed)
Pt returned from xray

## 2016-07-06 NOTE — ED Notes (Signed)
Patient transported to X-ray 

## 2016-07-06 NOTE — Discharge Instructions (Signed)
Take motrin for pain.   Take flexeril for muscle spasms.   Take vicodin for severe pain.   See your doctor.   Expect stiff and sore for several days.   Return to ER if you have severe pain, vomiting, headaches.

## 2016-07-06 NOTE — ED Provider Notes (Signed)
[facility] DEPT Provider Note   CSN: [id number] Arrival date & time: 07/06/16  1744     History   Chief Complaint Chief Complaint  Patient presents with  . Fall    HPI [name] is a 65 y.o. female hx of CKD, GERD, hepatitis, HTN, Here presenting with fall. Patient states that she slipped and fell and hit her head yesterday. Also hit her left shoulder and hip and lower back. She states that she did not pass out. She stated that she may have lost consciousness less than a minute. She has some headache and left shoulder pain and left hip pain when she walks.     The history is provided by the patient.    Past Medical History:  Diagnosis Date  . Arthritis    R knee, hands   . Arthritis   . Blood transfusion    1986- post-partial hysterectomy  . Cancer ([place])   . Chronic kidney disease   . GERD (gastroesophageal reflux disease)    recently taken off anti reflux tx  . Heart murmur    functional murmur, echo in [place]- long time ago   . Heart murmur   . Hepatitis    Hep. A- 1978  . Hepatitis   . Hypertension   . Lupus   . No pertinent past medical history    will see cardiac for review this week, 08/24/2011  . Renal disorder   . rt breast ca dx'd 12/2007   xrt comp;    Patient Active Problem List   Diagnosis Date Noted  . Chest pain 10/05/2014  . Hypokalemia 10/05/2014  . Palpitations 09/09/2013  . Osteoarthritis of right knee 08/29/2011  . OTITIS MEDIA, RIGHT 01/22/2010  . OBESITY 12/24/2009  . CERUMEN IMPACTION, RIGHT 12/03/2009  . NEUROPATHY 09/04/2009  . FATIGUE 09/04/2009  . COUGH 09/04/2009  . ARTHRALGIA 04/22/2009  . DYSURIA 04/22/2009  . Breast cancer of upper-outer quadrant of right female breast ([place]) 01/11/2008  . HIATAL HERNIA 12/14/2007  . DIVERTICULOSIS, COLON 12/14/2007  . CONSTIPATION 12/14/2007  . MAMMOGRAM, ABNORMAL, RIGHT 12/14/2007  . GERD 11/24/2007  . RHEUMATIC FEVER 11/03/2007  . HYPERTENSION, BENIGN ESSENTIAL 11/03/2007  .  LUPUS 11/03/2007  . HIP PAIN, RIGHT 11/03/2007  . ABDOMINAL PAIN 11/03/2007    Past Surgical History:  Procedure Laterality Date  . ABDOMINAL HYSTERECTOMY     1986  . ABDOMINAL HYSTERECTOMY    . APPENDECTOMY     1978 & tubal ligation   . APPENDECTOMY    . BREAST SURGERY     R breast lumpectomy  . BREAST SURGERY    . CHOLECYSTECTOMY     1978- open   . CHOLECYSTECTOMY    . JOINT REPLACEMENT     L knee replacement- 2006, arthroscopic surg. both knees   . JOINT REPLACEMENT    . MASTECTOMY     R breast- lumpectomy -2009  . MASTECTOMY    . TONSILLECTOMY     1954  . TONSILLECTOMY    . TOTAL KNEE ARTHROPLASTY  08/30/2011   Procedure: TOTAL KNEE ARTHROPLASTY;  Surgeon: Kerin Salen;  Location: [facility];  Service: Orthopedics;  Laterality: Right;  Right Total Knee Arthroplasty  . TUBAL LIGATION      OB History    Gravida Para Term Preterm AB Living   0 0 0 0 0     SAB TAB Ectopic Multiple Live Births   0 0 0  Home Medications    Prior to Admission medications   Medication Sig Start Date End Date Taking? Authorizing Provider  Cholecalciferol (VITAMIN D3) 2000 UNITS TABS Take 1 tablet by mouth every morning.    Yes Historical Provider, MD  Ginkgo Biloba 100 MG CAPS Take 1 capsule by mouth daily.    Yes Historical Provider, MD  hydrochlorothiazide (HYDRODIURIL) 25 MG tablet Take 25 mg by mouth every morning.    Yes Historical Provider, MD  metoprolol succinate (TOPROL-XL) 25 MG 24 hr tablet Take 50 mg by mouth every morning.   Yes Historical Provider, MD  tamoxifen (NOLVADEX) 20 MG tablet Take 1 tablet (20 mg total) by mouth daily. 01/26/16  Yes Chauncey Cruel, MD  traZODone (DESYREL) 50 MG tablet Take 50 mg by mouth at bedtime.  04/16/16  Yes Historical Provider, MD  diazepam (VALIUM) 10 MG tablet Take 10 mg by mouth every 6 (six) hours as needed for anxiety or sleep.  04/19/16   Historical Provider, MD  HYDROcodone-acetaminophen (NORCO/VICODIN) 5-325 MG per tablet Take  1 tablet by mouth every 6 (six) hours as needed for severe pain. Patient not taking: Reported on 07/06/2016 05/30/15   Renne Musca, MD  ibuprofen (ADVIL,MOTRIN) 800 MG tablet Take 1 tablet (800 mg total) by mouth every 8 (eight) hours as needed for moderate pain. Patient not taking: Reported on 07/06/2016 02/27/15   Drenda Freeze, MD  omeprazole (PRILOSEC) 40 MG capsule Take 1 capsule (40 mg total) by mouth daily. Patient taking differently: Take 40 mg by mouth daily as needed (indigestion).  07/21/15   Laurie Panda, NP  pantoprazole (PROTONIX) 40 MG tablet Take 1 tablet (40 mg total) by mouth daily. Patient not taking: Reported on 07/06/2016 10/07/14   Reyne Dumas, MD  zolpidem (AMBIEN) 5 MG tablet Take 1 tablet (5 mg total) by mouth at bedtime as needed. Patient not taking: Reported on 07/06/2016 01/27/15   Laurie Panda, NP    Family History Family History  Problem Relation Age of Onset  . Heart attack Father   . Cancer Other   . Hypertension Other   . Anesthesia problems Neg Hx   . Hypotension Neg Hx   . Malignant hyperthermia Neg Hx   . Pseudochol deficiency Neg Hx     Social History Social History  Substance Use Topics  . Smoking status: Never Smoker  . Smokeless tobacco: Never Used  . Alcohol use No     Allergies   Iodine; Iohexol; Latex; Penicillins; Penicillins; Tetracycline; Iodine; and Milk-related compounds   Review of Systems Review of Systems  Musculoskeletal:       Back pain, L hip pain, L rib pain   Neurological: Positive for headaches.  All other systems reviewed and are negative.    Physical Exam Updated Vital Signs BP 146/69   Pulse (!) 57   Temp 98.3 F (36.8 C) (Oral)   Resp 19   Ht 5\' 6"  (1.676 m)   Wt 200 lb (90.7 kg)   SpO2 98%   BMI 32.28 kg/m   Physical Exam  Constitutional: She is oriented to person, place, and time.  Uncomfortable   HENT:  Head: Normocephalic.  Eyes: EOM are normal. Pupils are equal, round, and  reactive to light.  Neck: Normal range of motion. Neck supple.  Cardiovascular: Normal rate, regular rhythm and normal heart sounds.   Pulmonary/Chest: Effort normal and breath sounds normal.  L posterior rib tenderness, no obvious deformity.   Abdominal: Soft. Bowel  sounds are normal. She exhibits no distension. There is no tenderness. There is no guarding.  Musculoskeletal:  + L lumbar tenderness, dec ROM L hip but no obvious deformity. Neurovascular intact lower extremities   Neurological: She is alert and oriented to person, place, and time.  Skin: Skin is warm.  Psychiatric: She has a normal mood and affect.  Nursing note and vitals reviewed.    ED Treatments / Results  Labs (all labs ordered are listed, but only abnormal results are displayed) Labs Reviewed - No data to display  EKG  EKG Interpretation None       Radiology Dg Ribs Unilateral W/chest Left  Result Date: 07/06/2016 CLINICAL DATA:  Initial evaluation for acute trauma, fall. Now with left rib pain. EXAM: LEFT RIBS AND CHEST - 3+ VIEW COMPARISON:  Prior radiograph from 03/16/2016. FINDINGS: Cardiac and mediastinal silhouettes are stable in size and contour, and remain within normal limits. Lungs are normally inflated. No focal infiltrates, pulmonary edema, or pleural effusion. No pneumothorax. Metallic knee BB marker overlies the lower left chest, presumably the site of pain. No acute displaced rib fracture identified. No other acute osseous abnormality. IMPRESSION: 1. No acute displaced rib fracture identified. 2. No other active cardiopulmonary disease. Electronically Signed   By: Jeannine Boga M.D.   On: 07/06/2016 22:19   Dg Lumbar Spine 2-3 Views  Result Date: 07/06/2016 CLINICAL DATA:  Low back pain after fall last night. EXAM: LUMBAR SPINE - 2-3 VIEW COMPARISON:  CT scan of December 25, 2014. FINDINGS: Mild levoscoliosis of lumbar spine is noted. No fracture or spondylolisthesis is noted. Disc spaces  appear to be well maintained. Degenerative changes seen involving the posterior facet joints of L3-4, L4-5 and L5-S1. IMPRESSION: Degenerative changes as described above. No acute abnormality seen in the lumbar spine. Electronically Signed   By: Marijo Conception, M.D.   On: 07/06/2016 20:07   Ct Head Wo Contrast  Result Date: 07/06/2016 CLINICAL DATA:  Slipped and fell.  Hit head. EXAM: CT HEAD WITHOUT CONTRAST CT CERVICAL SPINE WITHOUT CONTRAST TECHNIQUE: Multidetector CT imaging of the head and cervical spine was performed following the standard protocol without intravenous contrast. Multiplanar CT image reconstructions of the cervical spine were also generated. COMPARISON:  12/04/2007 FINDINGS: CT HEAD FINDINGS Brain: No evidence for acute hemorrhage, mass lesion, midline shift, hydrocephalus or large infarct. Vascular: No hyperdense vessel or unexpected calcification. Skull: Normal. Negative for fracture or focal lesion. Sinuses/Orbits: No acute finding. Other: None CT CERVICAL SPINE FINDINGS Alignment: Minimal retrolisthesis at C5-C6. Otherwise, the alignment is within normal limits. Skull base and vertebrae: Negative for a fracture or dislocation. Soft tissues and spinal canal: No prevertebral fluid or swelling. No visible canal hematoma. Disc levels: Disc space narrowing at C4-C5. Disc space narrowing at C5-C6. Multilevel bilateral degenerative facet disease. Upper chest: Scarring at the lung apices.  No pneumothorax. Other: None IMPRESSION: No acute intracranial abnormality. No acute bone abnormality in cervical spine. Multilevel degenerative changes in cervical spine. Electronically Signed   By: Markus Daft M.D.   On: 07/06/2016 20:50   Ct Cervical Spine Wo Contrast  Result Date: 07/06/2016 CLINICAL DATA:  Slipped and fell.  Hit head. EXAM: CT HEAD WITHOUT CONTRAST CT CERVICAL SPINE WITHOUT CONTRAST TECHNIQUE: Multidetector CT imaging of the head and cervical spine was performed following the  standard protocol without intravenous contrast. Multiplanar CT image reconstructions of the cervical spine were also generated. COMPARISON:  12/04/2007 FINDINGS: CT HEAD FINDINGS Brain: No evidence for  acute hemorrhage, mass lesion, midline shift, hydrocephalus or large infarct. Vascular: No hyperdense vessel or unexpected calcification. Skull: Normal. Negative for fracture or focal lesion. Sinuses/Orbits: No acute finding. Other: None CT CERVICAL SPINE FINDINGS Alignment: Minimal retrolisthesis at C5-C6. Otherwise, the alignment is within normal limits. Skull base and vertebrae: Negative for a fracture or dislocation. Soft tissues and spinal canal: No prevertebral fluid or swelling. No visible canal hematoma. Disc levels: Disc space narrowing at C4-C5. Disc space narrowing at C5-C6. Multilevel bilateral degenerative facet disease. Upper chest: Scarring at the lung apices.  No pneumothorax. Other: None IMPRESSION: No acute intracranial abnormality. No acute bone abnormality in cervical spine. Multilevel degenerative changes in cervical spine. Electronically Signed   By: Markus Daft M.D.   On: 07/06/2016 20:50   Dg Shoulder Left  Result Date: 07/06/2016 CLINICAL DATA:  Left shoulder pain after fall out of tub last night. EXAM: LEFT SHOULDER - 2+ VIEW COMPARISON:  None. FINDINGS: There is no evidence of fracture or dislocation. There is no evidence of arthropathy or other focal bone abnormality. Soft tissues are unremarkable. IMPRESSION: Normal left shoulder. Electronically Signed   By: Marijo Conception, M.D.   On: 07/06/2016 20:04   Dg Hip Unilat W Or Wo Pelvis 2-3 Views Left  Result Date: 07/06/2016 CLINICAL DATA:  Left hip pain after fall out of tub last night. EXAM: DG HIP (WITH OR WITHOUT PELVIS) 2-3V LEFT COMPARISON:  None. FINDINGS: There is no evidence of hip fracture or dislocation. There is no evidence of arthropathy or other focal bone abnormality. IMPRESSION: Normal left hip. Electronically Signed    By: Marijo Conception, M.D.   On: 07/06/2016 20:05    Procedures Procedures (including critical care time)  Medications Ordered in ED Medications  morphine 4 MG/ML injection 4 mg (4 mg Intramuscular Given 07/06/16 2125)  diazepam (VALIUM) injection 5 mg (5 mg Intramuscular Given 07/06/16 2125)     Initial Impression / Assessment and Plan / ED Course  I have reviewed the triage vital signs and the nursing notes.  Pertinent labs & imaging results that were available during my care of the patient were reviewed by me and considered in my medical decision making (see chart for details).  Clinical Course    [name] is a 66 y.o. female here with fall. No syncope, ? Brief LOC after falling. Will get CT head/neck, xrays. Will not do syncope workup.   11:01 PM Xray showed no fractures. Pain improved. Will dc home with vicodin, flexeril.    Final Clinical Impressions(s) / ED Diagnoses   Final diagnoses:  Fall    New Prescriptions New Prescriptions   No medications on file     Drenda Freeze, MD 07/06/16 2302

## 2016-07-19 ENCOUNTER — Other Ambulatory Visit (HOSPITAL_BASED_OUTPATIENT_CLINIC_OR_DEPARTMENT_OTHER): Payer: Commercial Managed Care - HMO

## 2016-07-19 ENCOUNTER — Other Ambulatory Visit: Payer: Self-pay | Admitting: Adult Health

## 2016-07-19 ENCOUNTER — Ambulatory Visit (HOSPITAL_BASED_OUTPATIENT_CLINIC_OR_DEPARTMENT_OTHER): Payer: Commercial Managed Care - HMO | Admitting: Adult Health

## 2016-07-19 ENCOUNTER — Telehealth: Payer: Self-pay | Admitting: *Deleted

## 2016-07-19 VITALS — BP 146/81 | HR 62 | Temp 98.3°F | Resp 16 | Ht 66.0 in | Wt 196.5 lb

## 2016-07-19 DIAGNOSIS — Z79811 Long term (current) use of aromatase inhibitors: Secondary | ICD-10-CM | POA: Diagnosis not present

## 2016-07-19 DIAGNOSIS — N3941 Urge incontinence: Secondary | ICD-10-CM

## 2016-07-19 DIAGNOSIS — K625 Hemorrhage of anus and rectum: Secondary | ICD-10-CM

## 2016-07-19 DIAGNOSIS — C50912 Malignant neoplasm of unspecified site of left female breast: Secondary | ICD-10-CM

## 2016-07-19 DIAGNOSIS — Z1231 Encounter for screening mammogram for malignant neoplasm of breast: Secondary | ICD-10-CM

## 2016-07-19 DIAGNOSIS — Z170 Estrogen receptor positive status [ER+]: Principal | ICD-10-CM

## 2016-07-19 LAB — COMPREHENSIVE METABOLIC PANEL
ALT: 14 U/L (ref 0–55)
AST: 22 U/L (ref 5–34)
Albumin: 3.3 g/dL — ABNORMAL LOW (ref 3.5–5.0)
Alkaline Phosphatase: 71 U/L (ref 40–150)
Anion Gap: 7 mEq/L (ref 3–11)
BUN: 12.4 mg/dL (ref 7.0–26.0)
CO2: 27 mEq/L (ref 22–29)
Calcium: 9.3 mg/dL (ref 8.4–10.4)
Chloride: 107 mEq/L (ref 98–109)
Creatinine: 0.8 mg/dL (ref 0.6–1.1)
EGFR: >90 mL/min/{1.73_m2} (ref >90)
Glucose: 85 mg/dl (ref 70–140)
Potassium: 3.8 mEq/L (ref 3.5–5.1)
Sodium: 141 mEq/L (ref 136–145)
Total Bilirubin: 0.46 mg/dL (ref 0.20–1.20)
Total Protein: 6.9 g/dL (ref 6.4–8.3)

## 2016-07-19 LAB — CBC WITH DIFFERENTIAL/PLATELET
BASO%: 0.4 % (ref 0.0–2.0)
Basophils Absolute: 0 10*3/uL (ref 0.0–0.1)
EOS%: 5.9 % (ref 0.0–7.0)
Eosinophils Absolute: 0.2 10*3/uL (ref 0.0–0.5)
HCT: 38.4 % (ref 34.8–46.6)
HGB: 12.5 g/dL (ref 11.6–15.9)
LYMPH%: 46.6 % (ref 14.0–49.7)
MCH: 29.9 pg (ref 25.1–34.0)
MCHC: 32.6 g/dL (ref 31.5–36.0)
MCV: 91.9 fL (ref 79.5–101.0)
MONO#: 0.5 10*3/uL (ref 0.1–0.9)
MONO%: 12.4 % (ref 0.0–14.0)
NEUT#: 1.3 10*3/uL — ABNORMAL LOW (ref 1.5–6.5)
NEUT%: 34.7 % — ABNORMAL LOW (ref 38.4–76.8)
Platelets: 174 10*3/uL (ref 145–400)
RBC: 4.18 10*6/uL (ref 3.70–5.45)
RDW: 13.6 % (ref 11.2–14.5)
WBC: 3.7 10*3/uL — ABNORMAL LOW (ref 3.9–10.3)
lymph#: 1.7 10*3/uL (ref 0.9–3.3)

## 2016-07-19 MED ORDER — TAMOXIFEN CITRATE 20 MG PO TABS: 20.0000 mg | ORAL_TABLET | Freq: Every day | ORAL | 2 refills | Status: DC

## 2016-07-19 NOTE — Telephone Encounter (Signed)
Called to inform pt of annual mammo scheduled for Dec.4 @1 :10p at the [facility]. Verbalized understanding. Message to be fwd to [employer].

## 2016-07-22 ENCOUNTER — Telehealth: Payer: Self-pay | Admitting: *Deleted

## 2016-07-22 NOTE — Telephone Encounter (Signed)
Called and left message to VM to inform pt of appt  with urologist Dr.McDiarmid on Dec.18 at 10:00.  If pt has any questions , she can call this nurse at [phone]. Message to be fwd to G.Dawson,NP.

## 2016-07-29 NOTE — Progress Notes (Signed)
CLINIC:  Survivorship   REASON FOR VISIT:  Routine follow-up for history of breast cancer.   BRIEF ONCOLOGIC HISTORY:  (From Dr. Virgie Dad last note on 01/26/16)   INTERVAL HISTORY:  [name] presents to the [facility] today for routine follow-up for her history of breast cancer.  Overall, she reports feeling relatively well. She remains on tamoxifen. She has occasional hot flashes, which are intermittent and only in the daytime. They are not very troublesome for her.  A couple of weeks ago, she tells me she fell in her bathtub and hit her head. She went to the emergency department and was evaluated. CT head was negative.  She does not think consciousness when this happened, but rather she slipped and fell. She has some residual knee pain and left hip pain since that fall, as well as some difficulty walking, but these are improving with time.  She has some fatigue and doesn't sleep well. She has muscle aches and cramping at times. She has mouth sores at times, which is secondary to her lupus. She reports changes in vision/blurred vision, and is scheduled to see an ophthalmologist for cataracts.  She reports rectal bleeding, which has been going on for the past year. She tells me she is occasionally constipated, and does have some pain when having bowel movements; she tells me this is especially bad when she has large stools; she tells me her last colonoscopy was about 5 years ago.  She tells me her urinary incontinence has worsened in the past 4 months. She tells me she has to wear a pad, which she did not have to do as often previously. She tells me this is mostly urge incontinence, and reports not being able to make it to the bathroom in time. This is distressing for her, and she wants to know if there is anything she can do about it.     REVIEW OF SYSTEMS:  Per HPI Breast: Denies any new nodularity, masses, tenderness, nipple changes, or nipple discharge.    A 14-point  review of systems was completed and was negative, except as noted above.    PAST MEDICAL/SURGICAL HISTORY:  Past Medical History:  Diagnosis Date  . Arthritis    R knee, hands   . Arthritis   . Blood transfusion    1986- post-partial hysterectomy  . Cancer ([place])   . Chronic kidney disease   . GERD (gastroesophageal reflux disease)    recently taken off anti reflux tx  . Heart murmur    functional murmur, echo in [place]- long time ago   . Heart murmur   . Hepatitis    Hep. A- 1978  . Hepatitis   . Hypertension   . Lupus   . No pertinent past medical history    will see cardiac for review this week, 08/24/2011  . Renal disorder   . rt breast ca dx'd 12/2007   xrt comp;   Past Surgical History:  Procedure Laterality Date  . ABDOMINAL HYSTERECTOMY     1986  . ABDOMINAL HYSTERECTOMY    . APPENDECTOMY     1978 & tubal ligation   . APPENDECTOMY    . BREAST SURGERY     R breast lumpectomy  . BREAST SURGERY    . CHOLECYSTECTOMY     1978- open   . CHOLECYSTECTOMY    . JOINT REPLACEMENT     L knee replacement- 2006, arthroscopic surg. both knees   . JOINT REPLACEMENT    .  MASTECTOMY     R breast- lumpectomy -2009  . MASTECTOMY    . TONSILLECTOMY     1954  . TONSILLECTOMY    . TOTAL KNEE ARTHROPLASTY  08/30/2011   Procedure: TOTAL KNEE ARTHROPLASTY;  Surgeon: Kerin Salen;  Location: [facility];  Service: Orthopedics;  Laterality: Right;  Right Total Knee Arthroplasty  . TUBAL LIGATION       ALLERGIES:  Allergies  Allergen Reactions  . Iodine Anaphylaxis  . Iohexol Anaphylaxis     Code: HIVES, Desc: PT IS ALLERGIC TO IODINATED CONTRAST; REACTION INCLUDES FACIAL SWELLING,HIVES,RESPIRATORY DISTRESS;PT HASN'T HAD SCAN W/PREMEDS.;REFUSED CONTRAST OF ANY KIND!  KR   . Latex Anaphylaxis  . Penicillins Anaphylaxis  . Penicillins Rash  . Tetracycline Anaphylaxis  . Iodine   . Milk-Related Compounds     Lactose intolerant     CURRENT MEDICATIONS:  Outpatient Encounter  Prescriptions as of 07/19/2016  Medication Sig Note  . Cholecalciferol (VITAMIN D3) 2000 UNITS TABS Take 1 tablet by mouth every morning.    . cyclobenzaprine (FLEXERIL) 5 MG tablet Take 1 tablet (5 mg total) by mouth 3 (three) times daily as needed for muscle spasms.   . diazepam (VALIUM) 10 MG tablet Take 10 mg by mouth every 6 (six) hours as needed for anxiety or sleep.  07/06/2016: Received from: External Pharmacy  . Ginkgo Biloba 100 MG CAPS Take 1 capsule by mouth daily.    . hydrochlorothiazide (HYDRODIURIL) 25 MG tablet Take 25 mg by mouth every morning.    Marland Kitchen HYDROcodone-acetaminophen (NORCO/VICODIN) 5-325 MG tablet Take 1 tablet by mouth every 6 (six) hours as needed for severe pain.   Marland Kitchen ibuprofen (ADVIL,MOTRIN) 800 MG tablet Take 1 tablet (800 mg total) by mouth every 8 (eight) hours as needed for moderate pain.   . metoprolol succinate (TOPROL-XL) 25 MG 24 hr tablet Take 50 mg by mouth every morning.   Marland Kitchen omeprazole (PRILOSEC) 40 MG capsule Take 1 capsule (40 mg total) by mouth daily. (Patient taking differently: Take 40 mg by mouth daily as needed (indigestion). )   . tamoxifen (NOLVADEX) 20 MG tablet Take 1 tablet (20 mg total) by mouth daily.   . traZODone (DESYREL) 50 MG tablet Take 50 mg by mouth at bedtime.  07/06/2016: Received from: External Pharmacy  . [DISCONTINUED] tamoxifen (NOLVADEX) 20 MG tablet Take 1 tablet (20 mg total) by mouth daily.   . [DISCONTINUED] pantoprazole (PROTONIX) 40 MG tablet Take 1 tablet (40 mg total) by mouth daily. (Patient not taking: Reported on 07/19/2016)   . [DISCONTINUED] zolpidem (AMBIEN) 5 MG tablet Take 1 tablet (5 mg total) by mouth at bedtime as needed. (Patient not taking: Reported on 07/19/2016)    No facility-administered encounter medications on file as of 07/19/2016.      ONCOLOGIC FAMILY HISTORY:  Family History  Problem Relation Age of Onset  . Heart attack Father   . Cancer Other   . Hypertension Other   . Anesthesia problems  Neg Hx   . Hypotension Neg Hx   . Malignant hyperthermia Neg Hx   . Pseudochol deficiency Neg Hx     GENETIC COUNSELING/TESTING: Reportedly BRCA negative; records are not available for review.  SOCIAL HISTORY:  [name] is married and lives with her husband in [place], [place]. She has a total of 4 children. She has 10 grandchildren. She is retired; she previously worked as a Marine scientist. She denies any current tobacco, alcohol, or illicit drug use.   PHYSICAL EXAMINATION:  Vital Signs: Vitals:   07/19/16 1131  BP: (!) 146/81  Pulse: 62  Resp: 16  Temp: 98.3 F (36.8 C)   Filed Weights   07/19/16 1131  Weight: 196 lb 8 oz (89.1 kg)   General: Well-nourished, well-appearing female in no acute distress.  She is unaccompanied today.   HEENT: Head is normocephalic.  Pupils equal and reactive to light. Conjunctivae clear without exudate.  Sclerae anicteric. Oral mucosa is pink, moist.  Oropharynx is slightly erythematous. Lymph: No cervical, supraclavicular, or infraclavicular lymphadenopathy noted on palpation.  Cardiovascular: Regular rate and rhythm.Marland Kitchen Respiratory: Clear to auscultation bilaterally. Chest expansion symmetric; breathing non-labored.  Breast Exam:  -Left breast: No appreciable masses on palpation. No skin redness, thickening, or peau d'orange appearance; no nipple retraction or nipple discharge. -Right breast: No appreciable masses on palpation. No skin redness, thickening, or peau d'orange appearance; no nipple retraction or nipple discharge; healed scar without erythema or nodularity. -Axilla: No axillary adenopathy bilaterally.  GI: Abdomen soft and round; non-tender, non-distended. Bowel sounds normoactive. No hepatosplenomegaly.   GU: Deferred.  Neuro: No focal deficits. Steady gait.  Psych: Mood and affect normal and appropriate for situation.  Extremities: No edema. Skin: Warm and dry.  LABORATORY DATA:  CBC Latest Ref Rng & Units 07/19/2016 01/26/2016  07/21/2015  WBC 3.9 - 10.3 10e3/uL 3.7(L) 4.4 4.2  Hemoglobin 11.6 - 15.9 g/dL 12.5 13.1 13.1  Hematocrit 34.8 - 46.6 % 38.4 40.0 39.5  Platelets 145 - 400 10e3/uL 174 195 186   CMP Latest Ref Rng & Units 07/19/2016 01/26/2016 07/21/2015  Glucose 70 - 140 mg/dl 85 84 89  BUN 7.0 - 26.0 mg/dL 12.4 11.2 9.4  Creatinine 0.6 - 1.1 mg/dL 0.8 0.8 0.8  Sodium 136 - 145 mEq/L 141 142 142  Potassium 3.5 - 5.1 mEq/L 3.8 3.2(L) 3.3(L)  Chloride 101 - 111 mmol/L - - -  CO2 22 - 29 mEq/L 27 26 30(H)  Calcium 8.4 - 10.4 mg/dL 9.3 9.7 9.7  Total Protein 6.4 - 8.3 g/dL 6.9 7.2 6.9  Total Bilirubin 0.20 - 1.20 mg/dL 0.46 0.38 0.51  Alkaline Phos 40 - 150 U/L 71 64 69  AST 5 - 34 U/L 22 22 22   ALT 0 - 55 U/L 14 18 17    *Labs reviewed and are largely stable/within normal limits.    DIAGNOSTIC IMAGING:  Most recent mammogram: 01/26/16 (Right breast only; palpable finding)    Last bilateral mammogram: 06/12/15    ASSESSMENT AND PLAN:  [name] is a pleasant 65 y.o. female with history of Stage IA right breast invasive ductal carcinoma, ER+/PR+/HER2-, diagnosed in 03/2008; treated with lumpectomy, adjuvant radiation therapy, and anti-estrogen therapy. She initially started anastrozole in 07/2008, but this was stopped due to side effects. She then started tamoxifen in 11/2008, with plans to complete to 10 years of anti-estrogen therapy.  She presents to the Survivorship [facility] for surveillance and routine follow-up.   1. History of Stage IA right breast cancer:  [name] is currently clinically and radiographically without evidence of disease or recurrence of breast cancer. She had a right diagnostic mammogram on 01/26/16 for a palpable concern; mammogram without evidence of malignancy. There are no abnormal palpable findings on clinical breast exam today. Her last bilateral diagnostic mammogram was on 06/12/15, and therefore she is overdue for her annual bilateral mammogram; orders placed today.  She will  follow-up with her medical oncologist, Dr. Jana Hakim, in 12/2016.  She will continue her antiestrogen therapy with tamoxifen.  I encouraged her to call me with any questions or concerns, and I would be happy to see her sooner if needed.   2. Rectal bleeding: [name] has had intermittent rectal bleeding for the past year. This has not been formally evaluated. Her last colonoscopy was about 5 years ago. We discussed that given her reported constipation and large stools, that she may have a fissure, which is leading to rectal bleeding. However, it is prudent that this symptom be evaluated by a specialist. We discussed referral to GI, which she agreed to have. Orders placed today with referral to [facility].   3. Urinary urge incontinence: [name] is very distressed about her urge incontinence, and the need to have to wear a pad. She tells me her symptoms have worsened in the past 4 months. She has never had this formally evaluated either. We discussed referral to urology, which she agreed to have. Formal referral to [facility] specialists placed today.  4. Bone health:  Given [name]'s age & history of breast cancer, she is at risk for bone demineralization. Her last DEXA scan was on 04/30/08 and was normal. Given that she is on tamoxifen, which has the added benefit of increased bone density, I will defer any future bone mineral density testing to her medical oncologist or PCP, as clinically indicated.  In the meantime, she was encouraged to increase her consumption of foods rich in calcium, as well as increase her weight-bearing activities.  She was given education on specific food and activities to promote bone health.  5. Health maintenance and wellness promotion: [name] was encouraged to consume 5-7 servings of fruits and vegetables per day. She was also encouraged to engage in moderate to vigorous exercise for 30 minutes per day most days of the week. She was instructed to  limit her alcohol consumption and continue to abstain from tobacco use.    Dispo:  -Annual mammogram overdue; orders placed today.  -Return to cancer center to see Dr. Jana Hakim in 12/2016.   A total of 30 minutes of face-to-face time was spent with this patient with greater than 50% of that time in counseling and care-coordination.   Mike Craze, NP [facility] [phone]   Note: PRIMARY CARE PROVIDER [name], MD [phone]

## 2016-08-23 ENCOUNTER — Ambulatory Visit: Payer: Self-pay

## 2016-09-09 IMAGING — DX DG CHEST 1V PORT
1 series · 1 of 1 positions shown · non-contrast
Comparison: Portable exam 7331 hr compared to 07/17/2013

CLINICAL DATA: Body aches, palpitations, heart skipping beats,
symptoms for 1 week worsened in last 2 days, history hypertension,
lupus, RIGHT breast cancer

EXAM:
PORTABLE CHEST - 1 VIEW

[chest ap]
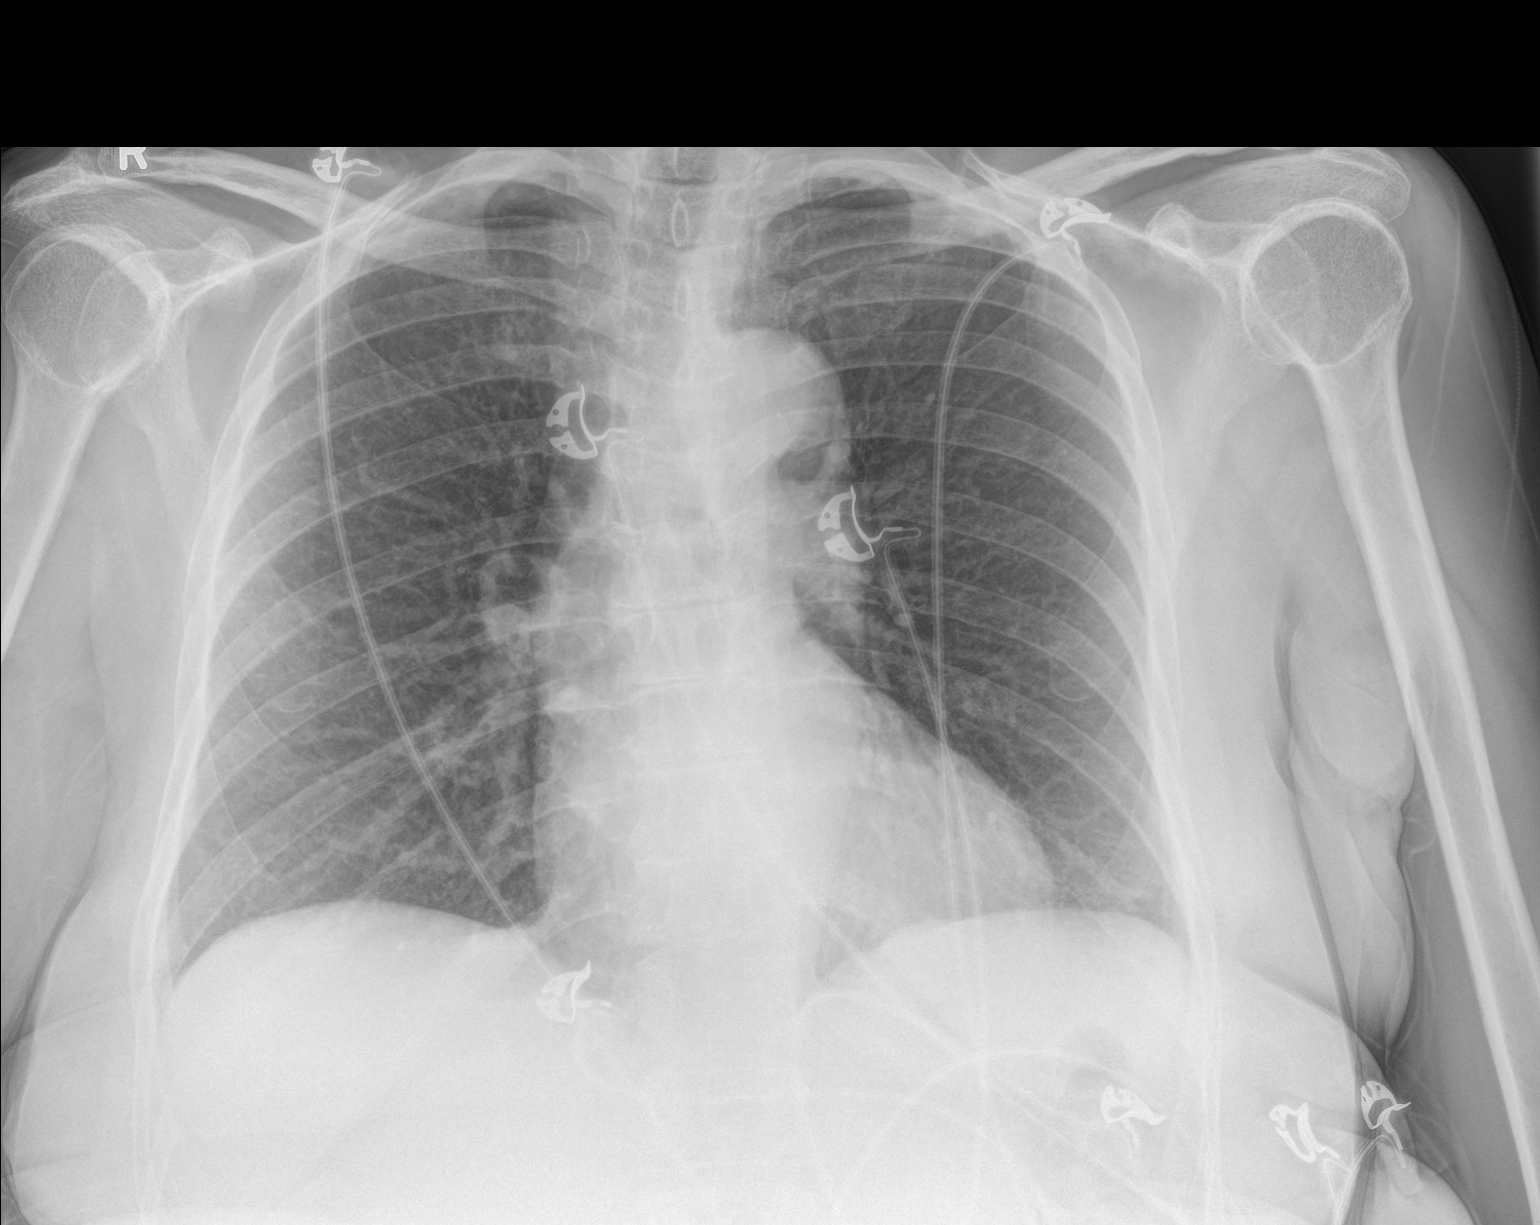

[1 of 1 positions shown; findings below may reference images not displayed]

FINDINGS: Normal heart size, mediastinal contours and pulmonary vascularity.

Lungs clear.

No pleural effusion or pneumothorax.

No acute osseous findings.
IMPRESSION: No acute abnormalities.

## 2016-11-25 ENCOUNTER — Other Ambulatory Visit: Payer: Self-pay | Admitting: *Deleted

## 2016-11-25 DIAGNOSIS — C50912 Malignant neoplasm of unspecified site of left female breast: Secondary | ICD-10-CM

## 2016-11-25 DIAGNOSIS — Z170 Estrogen receptor positive status [ER+]: Principal | ICD-10-CM

## 2016-11-25 MED ORDER — TAMOXIFEN CITRATE 20 MG PO TABS: 20.0000 mg | ORAL_TABLET | Freq: Every day | ORAL | 2 refills | Status: DC

## 2016-12-21 ENCOUNTER — Other Ambulatory Visit: Payer: Self-pay | Admitting: Adult Health

## 2016-12-21 DIAGNOSIS — C50411 Malignant neoplasm of upper-outer quadrant of right female breast: Secondary | ICD-10-CM

## 2016-12-22 ENCOUNTER — Ambulatory Visit: Payer: Self-pay | Admitting: Oncology

## 2016-12-22 ENCOUNTER — Other Ambulatory Visit: Payer: Self-pay

## 2017-01-03 ENCOUNTER — Telehealth: Payer: Self-pay | Admitting: Oncology

## 2017-01-03 NOTE — Telephone Encounter (Signed)
lvm to inform pt of lab/MD appt 5/9 at 3 pm per LOS

## 2017-01-26 ENCOUNTER — Ambulatory Visit (HOSPITAL_BASED_OUTPATIENT_CLINIC_OR_DEPARTMENT_OTHER): Payer: Medicare HMO | Admitting: Oncology

## 2017-01-26 ENCOUNTER — Other Ambulatory Visit (HOSPITAL_BASED_OUTPATIENT_CLINIC_OR_DEPARTMENT_OTHER): Payer: Medicare HMO

## 2017-01-26 ENCOUNTER — Other Ambulatory Visit: Payer: Self-pay

## 2017-01-26 VITALS — BP 121/67 | HR 65 | Temp 98.1°F | Ht 66.0 in | Wt 206.9 lb

## 2017-01-26 DIAGNOSIS — Z79811 Long term (current) use of aromatase inhibitors: Secondary | ICD-10-CM

## 2017-01-26 DIAGNOSIS — Z170 Estrogen receptor positive status [ER+]: Secondary | ICD-10-CM

## 2017-01-26 DIAGNOSIS — C50411 Malignant neoplasm of upper-outer quadrant of right female breast: Secondary | ICD-10-CM | POA: Diagnosis not present

## 2017-01-26 DIAGNOSIS — N390 Urinary tract infection, site not specified: Secondary | ICD-10-CM | POA: Diagnosis not present

## 2017-01-26 LAB — CBC WITH DIFFERENTIAL/PLATELET
BASO%: 0.6 % (ref 0.0–2.0)
Basophils Absolute: 0 10*3/uL (ref 0.0–0.1)
EOS%: 4.4 % (ref 0.0–7.0)
Eosinophils Absolute: 0.2 10*3/uL (ref 0.0–0.5)
HCT: 37.1 % (ref 34.8–46.6)
HGB: 12.4 g/dL (ref 11.6–15.9)
LYMPH%: 45.1 % (ref 14.0–49.7)
MCH: 30.7 pg (ref 25.1–34.0)
MCHC: 33.4 g/dL (ref 31.5–36.0)
MCV: 92 fL (ref 79.5–101.0)
MONO#: 0.7 10*3/uL (ref 0.1–0.9)
MONO%: 13.9 % (ref 0.0–14.0)
NEUT#: 1.7 10*3/uL (ref 1.5–6.5)
NEUT%: 36 % — ABNORMAL LOW (ref 38.4–76.8)
Platelets: 168 10*3/uL (ref 145–400)
RBC: 4.04 10*6/uL (ref 3.70–5.45)
RDW: 13.3 % (ref 11.2–14.5)
WBC: 4.8 10*3/uL (ref 3.9–10.3)
lymph#: 2.2 10*3/uL (ref 0.9–3.3)

## 2017-01-26 LAB — COMPREHENSIVE METABOLIC PANEL
ALT: 17 U/L (ref 0–55)
AST: 22 U/L (ref 5–34)
Albumin: 3.5 g/dL (ref 3.5–5.0)
Alkaline Phosphatase: 72 U/L (ref 40–150)
Anion Gap: 9 mEq/L (ref 3–11)
BUN: 11.5 mg/dL (ref 7.0–26.0)
CO2: 29 mEq/L (ref 22–29)
Calcium: 9.5 mg/dL (ref 8.4–10.4)
Chloride: 103 mEq/L (ref 98–109)
Creatinine: 0.8 mg/dL (ref 0.6–1.1)
EGFR: 88 mL/min/{1.73_m2} — ABNORMAL LOW (ref >90)
Glucose: 91 mg/dl (ref 70–140)
Potassium: 3.4 mEq/L — ABNORMAL LOW (ref 3.5–5.1)
Sodium: 141 mEq/L (ref 136–145)
Total Bilirubin: 0.38 mg/dL (ref 0.20–1.20)
Total Protein: 7.1 g/dL (ref 6.4–8.3)

## 2017-01-26 LAB — URINALYSIS, MICROSCOPIC - CHCC
Bilirubin (Urine): NEGATIVE
Glucose: NEGATIVE mg/dL
Ketones: NEGATIVE mg/dL
Nitrite: NEGATIVE
Protein: <30 mg/dL
Specific Gravity, Urine: 1.015 (ref 1.003–1.035)
Urobilinogen, UR: 0.2 mg/dL (ref 0.2–1)
pH: 7 (ref 4.6–8.0)

## 2017-01-26 NOTE — Progress Notes (Signed)
[place]  [phone]) [phone] [phone]) [phone]     ID: [name] DOB: [birth year]  MR#: [id number]  JQB#:[id number]  Patient Care Team: Nanci Pina, FNP as PCP - General (Nurse Practitioner) Gavin Pound, MD as Consulting Physician (Rheumatology) PCP: Nanci Pina, FNP GYN: SU:  OTHER MD: Arloa Koh MD  CHIEF COMPLAINT: Estrogen receptor positive breast cancer  CURRENT TREATMENT: tamoxifen    BREAST CANCER HISTORY: From Dr. Dana Allan earlier summary:  "#1 patient is status post lumpectomy with sentinel lymph node biopsy followed by radiation to the right breast. In December 2009 patient began aromatase inhibitor but could not tolerate it.  #2 therefore she began in March 2010 tamoxifen 20 mg daily. However she has experienced significant amount of aches and pains and in December 2012 discontinued it. At this time she does not want to go back on any kind of antiestrogen therapy due to side effects.  #3 patient would like to go back on tamoxifen 20 mg daily. She recently attended finding your new normal seminar. And because of that she wants to she would like to go back on the tamoxifen. We discussed side effects again. She was sent to her pharmacy."  Her subsequent history is as detailed below  INTERVAL HISTORY: Laytoya returns today for follow-up of her estrogen receptor positive breast cancer. She continues on tamoxifen, with good tolerance. She does have some hot flashes. Vaginal dryness or wetness are not major concerns. She obtains a drug at a good price.  Janae Bridgeman is one of our [employer] volunteers and is here usually on Wednesdays.  REVIEW OF SYSTEMS: She has some urinary frequency, no dysuria hematuria or fever, and brought this to the attention of her primary care physician who prescribed antibiotics. The time she got the antibiotics however the symptoms have cleared. They have not returned. Aside from that she has some joint  and muscle pain which is not new and not more persistent or intense than prior. Occasionally she complains of blurred vision. She thinks she is losing her hearing very slowly. She feels anxious but not depressed. She tells me her lupus is well-controlled. A detailed review of systems today was noncontributory  PAST MEDICAL HISTORY: Past Medical History:  Diagnosis Date  . Arthritis    R knee, hands   . Arthritis   . Blood transfusion    1986- post-partial hysterectomy  . Cancer ([place])   . Chronic kidney disease   . GERD (gastroesophageal reflux disease)    recently taken off anti reflux tx  . Heart murmur    functional murmur, echo in [place]- long time ago   . Heart murmur   . Hepatitis    Hep. A- 1978  . Hepatitis   . Hypertension   . Lupus   . No pertinent past medical history    will see cardiac for review this week, 08/24/2011  . Renal disorder   . rt breast ca dx'd 12/2007   xrt comp;    PAST SURGICAL HISTORY: Past Surgical History:  Procedure Laterality Date  . ABDOMINAL HYSTERECTOMY     1986  . ABDOMINAL HYSTERECTOMY    . APPENDECTOMY     1978 & tubal ligation   . APPENDECTOMY    . BREAST SURGERY     R breast lumpectomy  . BREAST SURGERY    . CHOLECYSTECTOMY     1978- open   . CHOLECYSTECTOMY    . JOINT REPLACEMENT     L knee  replacement- 2006, arthroscopic surg. both knees   . JOINT REPLACEMENT    . MASTECTOMY     R breast- lumpectomy -2009  . MASTECTOMY    . TONSILLECTOMY     1954  . TONSILLECTOMY    . TOTAL KNEE ARTHROPLASTY  08/30/2011   Procedure: TOTAL KNEE ARTHROPLASTY;  Surgeon: Kerin Salen;  Location: [facility];  Service: Orthopedics;  Laterality: Right;  Right Total Knee Arthroplasty  . TUBAL LIGATION      FAMILY HISTORY Family History  Problem Relation Age of Onset  . Heart attack Father   . Cancer Other   . Hypertension Other   . Anesthesia problems Neg Hx   . Hypotension Neg Hx   . Malignant hyperthermia Neg Hx   . Pseudochol deficiency  Neg Hx    the patient's father died from a heart attack at the age of 63. The patient's mother died at the age of 64. She had a history of breast cancer and one of her sisters was diagnosed with breast cancer at the age of 4. The patient had 5 brothers and 5 sisters. There is no other history of breast or ovarian cancer in the family to her knowledge.  GYNECOLOGIC HISTORY:  No LMP recorded. Patient has had a hysterectomy. Menarche age 66, first live birth age 66. The patient is GX P3. She had a simple hysterectomy in 1986 (no salpingo-oophorectomy.  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 She remarried in 2011. Her husband Laveda Abbe is retired from [employer]. The patient has one adopted son in addition to her own children. 3 of her children live in town 1 in [place]. She has 6 grandchildren and one on the way grandchildren. She attends the [employer] locally---She is one of our [employer] volunteers    ADVANCED DIRECTIVES: Not in place   HEALTH MAINTENANCE: Social History  Substance Use Topics  . Smoking status: Never Smoker  . Smokeless tobacco: Never Used  . Alcohol use No     Colonoscopy:  PAP:  Bone density: 04/30/2008 was normal  Lipid panel:  Allergies  Allergen Reactions  . Iodine Anaphylaxis  . Iohexol Anaphylaxis     Code: HIVES, Desc: PT IS ALLERGIC TO IODINATED CONTRAST; REACTION INCLUDES FACIAL SWELLING,HIVES,RESPIRATORY DISTRESS;PT HASN'T HAD SCAN W/PREMEDS.;REFUSED CONTRAST OF ANY KIND!  KR   . Latex Anaphylaxis  . Penicillins Anaphylaxis  . Penicillins Rash  . Tetracycline Anaphylaxis  . Iodine   . Milk-Related Compounds     Lactose intolerant    Current Outpatient Prescriptions  Medication Sig Dispense Refill  . Cholecalciferol (VITAMIN D3) 2000 UNITS TABS Take 1 tablet by mouth every morning.     . cyclobenzaprine (FLEXERIL) 5 MG tablet Take 1 tablet (5 mg total) by mouth 3 (three)  times daily as needed for muscle spasms. 10 tablet 0  . diazepam (VALIUM) 10 MG tablet Take 10 mg by mouth every 6 (six) hours as needed for anxiety or sleep.     . Ginkgo Biloba 100 MG CAPS Take 1 capsule by mouth daily.     . hydrochlorothiazide (HYDRODIURIL) 25 MG tablet Take 25 mg by mouth every morning.     Marland Kitchen HYDROcodone-acetaminophen (NORCO/VICODIN) 5-325 MG tablet Take 1 tablet by mouth every 6 (six) hours as needed for severe pain. 12 tablet 0  . ibuprofen (ADVIL,MOTRIN) 800 MG tablet Take 1 tablet (800 mg total) by mouth every  8 (eight) hours as needed for moderate pain. 30 tablet 0  . metoprolol succinate (TOPROL-XL) 25 MG 24 hr tablet Take 50 mg by mouth every morning.    Marland Kitchen omeprazole (PRILOSEC) 40 MG capsule Take 1 capsule (40 mg total) by mouth daily. (Patient taking differently: Take 40 mg by mouth daily as needed (indigestion). ) 30 capsule 5  . tamoxifen (NOLVADEX) 20 MG tablet Take 1 tablet (20 mg total) by mouth daily. 90 tablet 2  . traZODone (DESYREL) 50 MG tablet Take 50 mg by mouth at bedtime.      No current facility-administered medications for this visit.     OBJECTIVE: Middle-aged African-American woman Who appears stated age 66:   01/26/17 1549  BP: 121/67  Pulse: 65  Temp: 98.1 F (36.7 C)     Body mass index is 33.39 kg/m.    ECOG FS:1 - Symptomatic but completely ambulatory   Sclerae unicteric, EOMs intact Oropharynx clear and moist No cervical or supraclavicular adenopathy Lungs no rales or rhonchi Heart regular rate and rhythm Abd soft, nontender, positive bowel sounds MSK no focal spinal tenderness, no upper extremity lymphedema Neuro: nonfocal, well oriented, appropriate affect Breasts: The right breast has undergone lumpectomy followed by radiation with no evidence of local recurrence. The left breast is benign. Both axillae are benign.   LAB RESULTS:  CMP     Component Value Date/Time   NA 141 01/26/2017 1508   K 3.4 (L) 01/26/2017  1508   CL 104 05/30/2015 1237   CL 102 01/25/2013 0909   CO2 29 01/26/2017 1508   GLUCOSE 91 01/26/2017 1508   GLUCOSE 81 01/25/2013 0909   BUN 11.5 01/26/2017 1508   CREATININE 0.8 01/26/2017 1508   CALCIUM 9.5 01/26/2017 1508   PROT 7.1 01/26/2017 1508   ALBUMIN 3.5 01/26/2017 1508   AST 22 01/26/2017 1508   ALT 17 01/26/2017 1508   ALKPHOS 72 01/26/2017 1508   BILITOT 0.38 01/26/2017 1508   GFRNONAA >60 05/30/2015 1225   GFRAA >60 05/30/2015 1225    INo results found for: SPEP, UPEP  Lab Results  Component Value Date   WBC 4.8 01/26/2017   NEUTROABS 1.7 01/26/2017   HGB 12.4 01/26/2017   HCT 37.1 01/26/2017   MCV 92.0 01/26/2017   PLT 168 01/26/2017      Chemistry      Component Value Date/Time   NA 141 01/26/2017 1508   K 3.4 (L) 01/26/2017 1508   CL 104 05/30/2015 1237   CL 102 01/25/2013 0909   CO2 29 01/26/2017 1508   BUN 11.5 01/26/2017 1508   CREATININE 0.8 01/26/2017 1508      Component Value Date/Time   CALCIUM 9.5 01/26/2017 1508   ALKPHOS 72 01/26/2017 1508   AST 22 01/26/2017 1508   ALT 17 01/26/2017 1508   BILITOT 0.38 01/26/2017 1508       Lab Results  Component Value Date   LABCA2 16 11/09/2010    No components found for: HMCNO709  No results for input(s): INR in the last 168 hours.  Urinalysis    Component Value Date/Time   COLORURINE YELLOW 10/07/2014 [place] 10/07/2014 1605   LABSPEC 1.015 01/26/2017 1523   PHURINE 7.0 01/26/2017 1523   PHURINE 6.0 10/07/2014 1605   GLUCOSEU Negative 01/26/2017 1523   HGBUR Trace 01/26/2017 1523   HGBUR TRACE (A) 10/07/2014 1605   HGBUR negative 01/22/2010 1551   BILIRUBINUR Negative 01/26/2017 1523   KETONESUR Negative  01/26/2017 1523   KETONESUR NEGATIVE 10/07/2014 1605   PROTEINUR < 30 01/26/2017 1523   PROTEINUR NEGATIVE 10/07/2014 1605   UROBILINOGEN 0.2 01/26/2017 1523   NITRITE Negative 01/26/2017 1523   NITRITE NEGATIVE 10/07/2014 1605   LEUKOCYTESUR Small  01/26/2017 1523    STUDIES: Repeat mammography is due November of this year.  ASSESSMENT: 66 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  (2) status post right lumpectomy 03/20/2008 for invasive lobular carcinoma, multifocal, pT1a pNX, stage IA, grade 2, strongly estrogen and progesterone receptor positive, HER-2 negative. with negative margins.  (3) status post right axillary sentinel lymph node sampling 04/17/2008, the single sentinel lymph node being clear  (4) Oncotype score of 10 predicts an outside the breast risk of recurrence of 7% if the patient's only systemic therapy is tamoxifen for 5 years.  (5) Additional right breast biopsy 05/23/2008 to evaluate microcalcifications seen on pre-radiotherapy mammography showed only atypical lobular hyperplasia  (6) Completed radiation to the right breast 07/26/2008 (50 gray +14 gray boost).  (7) Started on anastrozole November 2009, with poor tolerance  (8). Started on tamoxifen March 2010, continued to December 2012, resumed December 2014   (a)  The plan is to continue tamoxifen  (9) status post simple hysterectomy 1986 (no salpingo-oophorectomy)  PLAN: Mahek is now 9 years out from definitive surgery for her breast cancer with no evidence of disease recurrence. This is very favorable.  She has been on tamoxifen now for about 8 years. The plan is to continue for a total of 10.  I reviewed her urinalysis today. It does not suggest active infection. She will call if you have symptoms develop.  Otherwise she will see me again in one year. She knows to call for any problems that may develop before that visit.   Chauncey Cruel, MD   01/26/2017 4:09 PM

## 2017-01-28 LAB — URINE CULTURE: Organism ID, Bacteria: NO GROWTH

## 2017-02-04 ENCOUNTER — Other Ambulatory Visit: Payer: Self-pay | Admitting: *Deleted

## 2017-02-04 MED ORDER — NITROFURANTOIN MONOHYD MACRO 100 MG PO CAPS: 100.0000 mg | ORAL_CAPSULE | Freq: Every day | ORAL | 0 refills | Status: DC

## 2017-02-13 ENCOUNTER — Emergency Department (HOSPITAL_COMMUNITY)
Admission: EM | Admit: 2017-02-13 | Discharge: 2017-02-14 | Discharge status: Against Medical Advice | Payer: Medicare HMO | Attending: Dermatology | Admitting: Dermatology

## 2017-02-13 ENCOUNTER — Encounter (HOSPITAL_COMMUNITY): Payer: Self-pay | Admitting: Emergency Medicine

## 2017-02-13 DIAGNOSIS — M25571 Pain in right ankle and joints of right foot: Secondary | ICD-10-CM | POA: Insufficient documentation

## 2017-02-13 NOTE — ED Provider Notes (Cosign Needed)
I never saw or evaluated pt. Pt left without being seen.    Flonnie Overman [place], [place] 02/13/17 1625

## 2017-02-13 NOTE — ED Triage Notes (Signed)
Pt from home with multiple complaints. Pt states she has pain with deep inhalation, swelling in her right ankle that is causing pain up to her right hip, and pressure/pulling with urination. Pt states she was diagnosed with a UTI on May 9th and took antibiotics for 1 week. Pt states following this her symptoms have not subsided. Pt can not remember what antibiotic she took. Pt is not febrile nor tachycardic at time of assessment and is able to maintain her oxygen at 100% on RA. No distress ntoed

## 2017-02-13 NOTE — ED Notes (Signed)
Pt called for room x2, no response from lobby

## 2017-02-13 NOTE — ED Notes (Signed)
Bed: [facility]1 Expected date:  Expected time:  Means of arrival:  Comments: 

## 2017-02-18 ENCOUNTER — Other Ambulatory Visit: Payer: Self-pay | Admitting: Oncology

## 2017-05-07 ENCOUNTER — Emergency Department (HOSPITAL_COMMUNITY)
Admission: EM | Admit: 2017-05-07 | Discharge: 2017-05-07 | Disposition: A | Discharge status: Home / Self Care | Payer: Medicare HMO | Attending: Emergency Medicine | Admitting: Emergency Medicine

## 2017-05-07 ENCOUNTER — Emergency Department (HOSPITAL_BASED_OUTPATIENT_CLINIC_OR_DEPARTMENT_OTHER)
Admission: EM | Admit: 2017-05-07 | Discharge: 2017-05-07 | Disposition: A | Discharge status: Home / Self Care | Payer: Medicare HMO | Source: Home / Self Care | Attending: Emergency Medicine | Admitting: Emergency Medicine

## 2017-05-07 ENCOUNTER — Encounter (HOSPITAL_COMMUNITY): Payer: Self-pay

## 2017-05-07 ENCOUNTER — Other Ambulatory Visit: Payer: Self-pay

## 2017-05-07 ENCOUNTER — Emergency Department (HOSPITAL_COMMUNITY): Payer: Medicare HMO

## 2017-05-07 DIAGNOSIS — M79605 Pain in left leg: Secondary | ICD-10-CM | POA: Diagnosis not present

## 2017-05-07 DIAGNOSIS — Z91040 Latex allergy status: Secondary | ICD-10-CM | POA: Insufficient documentation

## 2017-05-07 DIAGNOSIS — N189 Chronic kidney disease, unspecified: Secondary | ICD-10-CM | POA: Insufficient documentation

## 2017-05-07 DIAGNOSIS — L299 Pruritus, unspecified: Secondary | ICD-10-CM | POA: Insufficient documentation

## 2017-05-07 DIAGNOSIS — I129 Hypertensive chronic kidney disease with stage 1 through stage 4 chronic kidney disease, or unspecified chronic kidney disease: Secondary | ICD-10-CM | POA: Insufficient documentation

## 2017-05-07 DIAGNOSIS — R51 Headache: Secondary | ICD-10-CM | POA: Diagnosis present

## 2017-05-07 DIAGNOSIS — R0602 Shortness of breath: Secondary | ICD-10-CM | POA: Insufficient documentation

## 2017-05-07 DIAGNOSIS — Z853 Personal history of malignant neoplasm of breast: Secondary | ICD-10-CM | POA: Insufficient documentation

## 2017-05-07 DIAGNOSIS — M7989 Other specified soft tissue disorders: Secondary | ICD-10-CM | POA: Diagnosis not present

## 2017-05-07 DIAGNOSIS — M79672 Pain in left foot: Secondary | ICD-10-CM | POA: Diagnosis not present

## 2017-05-07 DIAGNOSIS — M1990 Unspecified osteoarthritis, unspecified site: Secondary | ICD-10-CM | POA: Diagnosis not present

## 2017-05-07 DIAGNOSIS — Z79899 Other long term (current) drug therapy: Secondary | ICD-10-CM | POA: Insufficient documentation

## 2017-05-07 DIAGNOSIS — M3219 Other organ or system involvement in systemic lupus erythematosus: Secondary | ICD-10-CM | POA: Insufficient documentation

## 2017-05-07 LAB — CBC WITH DIFFERENTIAL/PLATELET
Basophils Absolute: 0 10*3/uL (ref 0.0–0.1)
Basophils Relative: 0 %
Eosinophils Absolute: 0.2 10*3/uL (ref 0.0–0.7)
Eosinophils Relative: 4 %
HCT: 36.8 % (ref 36.0–46.0)
Hemoglobin: 12.2 g/dL (ref 12.0–15.0)
Lymphocytes Relative: 47 %
Lymphs Abs: 2.2 10*3/uL (ref 0.7–4.0)
MCH: 30.3 pg (ref 26.0–34.0)
MCHC: 33.2 g/dL (ref 30.0–36.0)
MCV: 91.3 fL (ref 78.0–100.0)
Monocytes Absolute: 0.6 10*3/uL (ref 0.1–1.0)
Monocytes Relative: 13 %
Neutro Abs: 1.8 10*3/uL (ref 1.7–7.7)
Neutrophils Relative %: 36 %
Platelets: 191 10*3/uL (ref 150–400)
RBC: 4.03 MIL/uL (ref 3.87–5.11)
RDW: 13.7 % (ref 11.5–15.5)
WBC: 4.8 10*3/uL (ref 4.0–10.5)

## 2017-05-07 LAB — COMPREHENSIVE METABOLIC PANEL
ALT: 16 U/L (ref 14–54)
AST: 20 U/L (ref 15–41)
Albumin: 3.1 g/dL — ABNORMAL LOW (ref 3.5–5.0)
Alkaline Phosphatase: 56 U/L (ref 38–126)
Anion gap: 6 (ref 5–15)
BUN: 11 mg/dL (ref 6–20)
CO2: 28 mmol/L (ref 22–32)
Calcium: 8.6 mg/dL — ABNORMAL LOW (ref 8.9–10.3)
Chloride: 104 mmol/L (ref 101–111)
Creatinine, Ser: 0.67 mg/dL (ref 0.44–1.00)
GFR calc Af Amer: >60 mL/min (ref >60)
GFR calc non Af Amer: >60 mL/min (ref >60)
Glucose, Bld: 96 mg/dL (ref 65–99)
Potassium: 3.5 mmol/L (ref 3.5–5.1)
Sodium: 138 mmol/L (ref 135–145)
Total Bilirubin: 0.2 mg/dL — ABNORMAL LOW (ref 0.3–1.2)
Total Protein: 6.1 g/dL — ABNORMAL LOW (ref 6.5–8.1)

## 2017-05-07 LAB — I-STAT TROPONIN, ED: Troponin i, poc: 0 ng/mL (ref 0.00–0.08)

## 2017-05-07 LAB — BRAIN NATRIURETIC PEPTIDE: B Natriuretic Peptide: 38.4 pg/mL (ref 0.0–100.0)

## 2017-05-07 MED ORDER — PREDNISONE 10 MG PO TABS: 20.0000 mg | ORAL_TABLET | Freq: Every day | ORAL | 0 refills | Status: AC

## 2017-05-07 MED ORDER — PREDNISONE 20 MG PO TABS
20.0000 mg | ORAL_TABLET | Freq: Once | ORAL | Status: AC
  Administered 2017-05-07: 20 mg via ORAL
  Filled 2017-05-07: qty 1

## 2017-05-07 MED ORDER — KETOROLAC TROMETHAMINE 60 MG/2ML IM SOLN
60.0000 mg | Freq: Once | INTRAMUSCULAR | Status: AC
  Administered 2017-05-07: 60 mg via INTRAMUSCULAR
  Filled 2017-05-07: qty 2

## 2017-05-07 NOTE — ED Provider Notes (Signed)
[facility] DEPT Provider Note   CSN: [id number] Arrival date & time: 05/07/17  0998     History   Chief Complaint Chief Complaint  Patient presents with  . Pruritis  . Headache  . Foot Pain    HPI [name] is a 66 y.o. female.  HPI   66 year old female with history of lupus presents with concern for polyarthralgia is and left lower extremity swelling. Patient reports that her symptoms are consistent with prior lupus flares. Reports that they began about a week ago. Reports she had a 6 day steroid course given by her primary care physician with initial improvement of her symptoms, however on Thursday when she discontinued the steroids her symptoms returned. They've been worsening over the last 2 days. Reports pain in all of her joints, including risks, fingers, elbows, shoulders, bilateral hips, knees and ankles. Reports that in addition to the pain that she typically has with her lupus flares, she has left lower extremity pain and swelling. Reports this is new and different. Reports it feels like the bottom of her foot is numb because of swelling. On review of systems, also reports she has some shortness of breath. Reports she notices it more when she lays down flat. Reports that she also gets this with her lupus flares. Reports itching that she associates with lupus as well, no chest pain, nausea, vomiting, or fevers. Reports headache that began slowly and is throbbing.   Past Medical History:  Diagnosis Date  . Arthritis    R knee, hands   . Arthritis   . Blood transfusion    1986- post-partial hysterectomy  . Cancer ([place])   . Chronic kidney disease   . GERD (gastroesophageal reflux disease)    recently taken off anti reflux tx  . Heart murmur    functional murmur, echo in [place]- long time ago   . Heart murmur   . Hepatitis    Hep. A- 1978  . Hepatitis   . Hypertension   . Lupus   . No pertinent past medical history    will see cardiac for review this week,  08/24/2011  . Renal disorder   . rt breast ca dx'd 12/2007   xrt comp;    Patient Active Problem List   Diagnosis Date Noted  . Chest pain 10/05/2014  . Hypokalemia 10/05/2014  . Palpitations 09/09/2013  . Osteoarthritis of right knee 08/29/2011  . OTITIS MEDIA, RIGHT 01/22/2010  . OBESITY 12/24/2009  . CERUMEN IMPACTION, RIGHT 12/03/2009  . NEUROPATHY 09/04/2009  . FATIGUE 09/04/2009  . COUGH 09/04/2009  . ARTHRALGIA 04/22/2009  . DYSURIA 04/22/2009  . Malignant neoplasm of upper-outer quadrant of right breast in female, estrogen receptor positive ([place]) 01/11/2008  . HIATAL HERNIA 12/14/2007  . DIVERTICULOSIS, COLON 12/14/2007  . CONSTIPATION 12/14/2007  . MAMMOGRAM, ABNORMAL, RIGHT 12/14/2007  . GERD 11/24/2007  . RHEUMATIC FEVER 11/03/2007  . HYPERTENSION, BENIGN ESSENTIAL 11/03/2007  . LUPUS 11/03/2007  . HIP PAIN, RIGHT 11/03/2007  . ABDOMINAL PAIN 11/03/2007    Past Surgical History:  Procedure Laterality Date  . ABDOMINAL HYSTERECTOMY     1986  . ABDOMINAL HYSTERECTOMY    . APPENDECTOMY     1978 & tubal ligation   . APPENDECTOMY    . BREAST SURGERY     R breast lumpectomy  . BREAST SURGERY    . CHOLECYSTECTOMY     1978- open   . CHOLECYSTECTOMY    . JOINT REPLACEMENT     L  knee replacement- 2006, arthroscopic surg. both knees   . JOINT REPLACEMENT    . TONSILLECTOMY     1954  . TONSILLECTOMY    . TOTAL KNEE ARTHROPLASTY  08/30/2011   Procedure: TOTAL KNEE ARTHROPLASTY;  Surgeon: Kerin Salen;  Location: [facility];  Service: Orthopedics;  Laterality: Right;  Right Total Knee Arthroplasty  . TUBAL LIGATION      OB History    Gravida Para Term Preterm AB Living   0 0 0 0 0     SAB TAB Ectopic Multiple Live Births   0 0 0           Home Medications    Prior to Admission medications   Medication Sig Start Date End Date Taking? Authorizing Provider  hydrochlorothiazide (HYDRODIURIL) 25 MG tablet Take 25 mg by mouth every morning.    Yes [provider]  ibuprofen (ADVIL,MOTRIN) 800 MG tablet Take 1 tablet (800 mg total) by mouth every 8 (eight) hours as needed for moderate pain. 02/27/15  Yes Drenda Freeze, MD  metoprolol succinate (TOPROL-XL) 25 MG 24 hr tablet Take 50 mg by mouth every morning.   Yes [provider]  tamoxifen (NOLVADEX) 20 MG tablet Take 1 tablet (20 mg total) by mouth daily. 11/25/16  Yes Magrinat, Virgie Dad, MD  traZODone (DESYREL) 50 MG tablet Take 50 mg by mouth at bedtime.  04/16/16  Yes [provider]  cyclobenzaprine (FLEXERIL) 5 MG tablet Take 1 tablet (5 mg total) by mouth 3 (three) times daily as needed for muscle spasms. Patient not taking: Reported on 05/07/2017 07/06/16   Drenda Freeze, MD  HYDROcodone-acetaminophen (NORCO/VICODIN) 5-325 MG tablet Take 1 tablet by mouth every 6 (six) hours as needed for severe pain. Patient not taking: Reported on 05/07/2017 07/06/16   Drenda Freeze, MD  nitrofurantoin, macrocrystal-monohydrate, (MACROBID) 100 MG capsule Take 1 capsule (100 mg total) by mouth daily. Patient not taking: Reported on 05/07/2017 02/04/17   Magrinat, Virgie Dad, MD  omeprazole (PRILOSEC) 40 MG capsule Take 1 capsule (40 mg total) by mouth daily. Patient not taking: Reported on 05/07/2017 07/21/15   Boelter, Genelle Gather, NP  predniSONE (DELTASONE) 10 MG tablet Take 2 tablets (20 mg total) by mouth daily. Take 20mg  daily for 7 days, then 10mg  daily for 5 days 05/07/17 05/17/17  Gareth Morgan, MD    Family History Family History  Problem Relation Age of Onset  . Heart attack Father   . Cancer Other   . Hypertension Other   . Anesthesia problems Neg Hx   . Hypotension Neg Hx   . Malignant hyperthermia Neg Hx   . Pseudochol deficiency Neg Hx     Social History Social History  Substance Use Topics  . Smoking status: Never Smoker  . Smokeless tobacco: Never Used  . Alcohol use No     Allergies   Iodine; Iohexol; Latex; Penicillins; Penicillins;  Tetracycline; Iodine; and Milk-related compounds   Review of Systems Review of Systems  Constitutional: Negative for fever.  HENT: Negative for sore throat.   Eyes: Negative for visual disturbance.  Respiratory: Positive for shortness of breath. Negative for cough.   Cardiovascular: Positive for leg swelling. Negative for chest pain.  Gastrointestinal: Negative for abdominal pain, diarrhea, nausea and vomiting.  Genitourinary: Negative for difficulty urinating.  Musculoskeletal: Positive for arthralgias and myalgias. Negative for back pain and neck pain.  Skin: Negative for rash.  Neurological: Negative for syncope and headaches.  Physical Exam Updated Vital Signs BP 123/80   Pulse 64   Temp 98.8 F (37.1 C) (Oral)   Resp 16   Ht 5\' 6"  (1.676 m)   Wt 90.7 kg (200 lb)   SpO2 95%   BMI 32.28 kg/m   Physical Exam  Constitutional: She is oriented to person, place, and time. She appears well-developed and well-nourished. No distress.  HENT:  Head: Normocephalic and atraumatic.  Eyes: Conjunctivae and EOM are normal.  Neck: Normal range of motion.  Cardiovascular: Normal rate, regular rhythm, normal heart sounds and intact distal pulses.  Exam reveals no gallop and no friction rub.   No murmur heard. Pulmonary/Chest: Effort normal and breath sounds normal. No respiratory distress. She has no wheezes. She has no rales.  Abdominal: Soft. She exhibits no distension. There is no tenderness. There is no guarding.  Musculoskeletal: She exhibits edema (mild left ankle). She exhibits no tenderness.  Neurological: She is alert and oriented to person, place, and time.  Skin: Skin is warm and dry. No rash noted. She is not diaphoretic. No erythema.  Nursing note and vitals reviewed.    ED Treatments / Results  Labs (all labs ordered are listed, but only abnormal results are displayed) Labs Reviewed  COMPREHENSIVE METABOLIC PANEL - Abnormal; Notable for the following:        Result Value   Calcium 8.6 (*)    Total Protein 6.1 (*)    Albumin 3.1 (*)    Total Bilirubin 0.2 (*)    All other components within normal limits  BRAIN NATRIURETIC PEPTIDE  CBC WITH DIFFERENTIAL/PLATELET  I-STAT TROPONIN, ED    EKG  EKG Interpretation None       Radiology Dg Chest 2 View  Result Date: 05/07/2017 CLINICAL DATA:  Headache.  Joint pain. EXAM: CHEST  2 VIEW COMPARISON:  July 06, 2016 FINDINGS: The heart size and mediastinal contours are within normal limits. Both lungs are clear. The visualized skeletal structures are unremarkable. IMPRESSION: No active cardiopulmonary disease. Electronically Signed   By: Dorise Bullion III M.D   On: 05/07/2017 13:04    Procedures Procedures (including critical care time)  Medications Ordered in ED Medications  predniSONE (DELTASONE) tablet 20 mg (20 mg Oral Given 05/07/17 1309)  ketorolac (TORADOL) injection 60 mg (60 mg Intramuscular Given 05/07/17 1332)     Initial Impression / Assessment and Plan / ED Course  I have reviewed the triage vital signs and the nursing notes.  Pertinent labs & imaging results that were available during my care of the patient were reviewed by me and considered in my medical decision making (see chart for details).     66 year old female with history of lupus presents with concern for polyarthralgia is and left lower extremity swelling.  LE DVT study done and negative.  On ROS pt reports some DOE and orthopnea which is consistent with prior lupus per pt, and reports she is more concerned regarding joint pain, left foot pain.  XR chest shows no acute abnormalities. Labs show no acute findings. Pt without hypoxia, no tachypnea, no tachycardia, no hx of DVT, with negative DVT study, pt reporting similar symptoms with prior flare, have low suspicion for pulmonary embolus at this time and do not feel admission/VQ is indicated. No sign of septic arthritis in left ankle, no erythema, no warmth.    Patient with symptoms consistent with lupus exacerbation. Given steroid rx. Rec ibuprofen/tylenol.  Recommend follow up with rheumatologist and PCP. Patient discharged in  stable condition with understanding of reasons to return.   Final Clinical Impressions(s) / ED Diagnoses   Final diagnoses:  Systemic lupus erythematosus with other organ involvement, unspecified SLE type ([place])  Arthritis    New Prescriptions Discharge Medication List as of 05/07/2017  5:47 PM    START taking these medications   Details  predniSONE (DELTASONE) 10 MG tablet Take 2 tablets (20 mg total) by mouth daily. Take 20mg  daily for 7 days, then 10mg  daily for 5 days, Starting Sat 05/07/2017, Until Tue 05/17/2017, Print         Gareth Morgan, MD 05/07/17 [phone]

## 2017-05-07 NOTE — Progress Notes (Signed)
*  PRELIMINARY RESULTS* Vascular Ultrasound Left lower extremity venous duplex has been completed.  Preliminary findings: No evidence of deep vein thrombosis or baker's cysts in the left lower extremity.  Preliminary results given to Dr. Jeneen Rinks to give to Dr. Billy Fischer @ 14:10.   Everrett Coombe 05/07/2017, 2:12 PM

## 2017-05-07 NOTE — ED Notes (Signed)
Patient requesting update from MD. MD made aware.

## 2017-05-07 NOTE — ED Notes (Signed)
IV removed from right forearm.

## 2017-05-07 NOTE — ED Notes (Signed)
Pt currently in X-ray.

## 2017-05-07 NOTE — ED Notes (Signed)
Patient given sandwich and water

## 2017-05-07 NOTE — ED Notes (Signed)
US at bedside

## 2017-05-07 NOTE — ED Notes (Signed)
RN Claiborne Billings will collect labs with iv start

## 2017-05-07 NOTE — Discharge Instructions (Signed)
Take Tylenol 1000 mg 4 times a day for 1 week. This is the maximum dose of Tylenol usually take from all sources. Please check other over-the-counter medications and prescriptions to ensure you are not taking other medications that contain acetaminophen.  You may also take ibuprofen 400 mg 6 times a day alternating with or at the same time as tylenol.   

## 2017-05-07 NOTE — ED Triage Notes (Signed)
Patient states she is having a headache, joint pain, itching, and bilateral foot pain, L>R. Patient c/o left foot feeling numb and swollen x 2 days.

## 2017-05-07 NOTE — ED Notes (Signed)
Pt requesting [name] IV, charge RN asked to attempt

## 2017-06-16 ENCOUNTER — Other Ambulatory Visit: Payer: Self-pay | Admitting: Internal Medicine

## 2017-06-16 DIAGNOSIS — Z1231 Encounter for screening mammogram for malignant neoplasm of breast: Secondary | ICD-10-CM

## 2017-06-27 ENCOUNTER — Ambulatory Visit
Admission: RE | Admit: 2017-06-27 | Discharge: 2017-06-27 | Disposition: A | Discharge status: Home / Self Care | Payer: Medicare HMO | Source: Ambulatory Visit | Attending: Internal Medicine | Admitting: Internal Medicine

## 2017-06-27 DIAGNOSIS — Z1231 Encounter for screening mammogram for malignant neoplasm of breast: Secondary | ICD-10-CM

## 2017-06-27 HISTORY — DX: Personal history of irradiation: Z92.3

## 2017-09-27 ENCOUNTER — Encounter (HOSPITAL_COMMUNITY): Payer: Self-pay | Admitting: Emergency Medicine

## 2017-09-27 ENCOUNTER — Emergency Department (HOSPITAL_COMMUNITY): Payer: Medicare HMO

## 2017-09-27 DIAGNOSIS — I129 Hypertensive chronic kidney disease with stage 1 through stage 4 chronic kidney disease, or unspecified chronic kidney disease: Secondary | ICD-10-CM | POA: Diagnosis not present

## 2017-09-27 DIAGNOSIS — Z79899 Other long term (current) drug therapy: Secondary | ICD-10-CM | POA: Insufficient documentation

## 2017-09-27 DIAGNOSIS — R0789 Other chest pain: Secondary | ICD-10-CM | POA: Diagnosis not present

## 2017-09-27 DIAGNOSIS — Z91040 Latex allergy status: Secondary | ICD-10-CM | POA: Insufficient documentation

## 2017-09-27 DIAGNOSIS — R1110 Vomiting, unspecified: Secondary | ICD-10-CM | POA: Diagnosis present

## 2017-09-27 DIAGNOSIS — N189 Chronic kidney disease, unspecified: Secondary | ICD-10-CM | POA: Diagnosis not present

## 2017-09-27 DIAGNOSIS — Z96653 Presence of artificial knee joint, bilateral: Secondary | ICD-10-CM | POA: Diagnosis not present

## 2017-09-27 DIAGNOSIS — D0591 Unspecified type of carcinoma in situ of right breast: Secondary | ICD-10-CM | POA: Diagnosis not present

## 2017-09-27 DIAGNOSIS — R05 Cough: Secondary | ICD-10-CM | POA: Diagnosis not present

## 2017-09-27 LAB — CBC
HCT: 39.8 % (ref 36.0–46.0)
Hemoglobin: 13.5 g/dL (ref 12.0–15.0)
MCH: 30.8 pg (ref 26.0–34.0)
MCHC: 33.9 g/dL (ref 30.0–36.0)
MCV: 90.7 fL (ref 78.0–100.0)
Platelets: 199 10*3/uL (ref 150–400)
RBC: 4.39 MIL/uL (ref 3.87–5.11)
RDW: 13.2 % (ref 11.5–15.5)
WBC: 4.6 10*3/uL (ref 4.0–10.5)

## 2017-09-27 LAB — BASIC METABOLIC PANEL
Anion gap: 10 (ref 5–15)
BUN: 6 mg/dL (ref 6–20)
CO2: 23 mmol/L (ref 22–32)
Calcium: 9.1 mg/dL (ref 8.9–10.3)
Chloride: 99 mmol/L — ABNORMAL LOW (ref 101–111)
Creatinine, Ser: 0.76 mg/dL (ref 0.44–1.00)
GFR calc Af Amer: >60 mL/min (ref >60)
GFR calc non Af Amer: >60 mL/min (ref >60)
Glucose, Bld: 108 mg/dL — ABNORMAL HIGH (ref 65–99)
Potassium: 3 mmol/L — ABNORMAL LOW (ref 3.5–5.1)
Sodium: 132 mmol/L — ABNORMAL LOW (ref 135–145)

## 2017-09-27 LAB — I-STAT TROPONIN, ED: Troponin i, poc: 0 ng/mL (ref 0.00–0.08)

## 2017-09-27 NOTE — ED Triage Notes (Signed)
Patient c/o body aches, emesis and chest pain onset of this afternoon. C/o tightness in center of chest radiating to right.

## 2017-09-28 ENCOUNTER — Emergency Department (HOSPITAL_COMMUNITY)
Admission: EM | Admit: 2017-09-28 | Discharge: 2017-09-28 | Disposition: A | Discharge status: Home / Self Care | Payer: Medicare HMO | Attending: Emergency Medicine | Admitting: Emergency Medicine

## 2017-09-28 DIAGNOSIS — R69 Illness, unspecified: Secondary | ICD-10-CM

## 2017-09-28 DIAGNOSIS — R0789 Other chest pain: Secondary | ICD-10-CM

## 2017-09-28 DIAGNOSIS — J111 Influenza due to unidentified influenza virus with other respiratory manifestations: Secondary | ICD-10-CM

## 2017-09-28 LAB — TROPONIN I: Troponin I: <0.03 ng/mL (ref <0.03)

## 2017-09-28 MED ORDER — GUAIFENESIN-CODEINE 100-10 MG/5ML PO SOLN
10.0000 mL | Freq: Four times a day (QID) | ORAL | 0 refills | Status: DC | PRN
Start: 2017-09-28 — End: 2018-02-17

## 2017-09-28 MED ORDER — GUAIFENESIN-CODEINE 100-10 MG/5ML PO SOLN
10.0000 mL | Freq: Once | ORAL | Status: AC
  Administered 2017-09-28: 10 mL via ORAL
  Filled 2017-09-28: qty 10

## 2017-09-28 MED ORDER — ALBUTEROL SULFATE HFA 108 (90 BASE) MCG/ACT IN AERS
2.0000 | INHALATION_SPRAY | Freq: Once | RESPIRATORY_TRACT | Status: AC
  Administered 2017-09-28: 2 via RESPIRATORY_TRACT
  Filled 2017-09-28: qty 6.7

## 2017-09-28 MED ORDER — OSELTAMIVIR PHOSPHATE 75 MG PO CAPS: 75.0000 mg | ORAL_CAPSULE | Freq: Two times a day (BID) | ORAL | 0 refills | Status: DC

## 2017-09-28 MED ORDER — SODIUM CHLORIDE 0.9 % IV BOLUS (SEPSIS)
1000.0000 mL | Freq: Once | INTRAVENOUS | Status: AC
  Administered 2017-09-28: 1000 mL via INTRAVENOUS

## 2017-09-28 MED ORDER — IPRATROPIUM-ALBUTEROL 0.5-2.5 (3) MG/3ML IN SOLN
3.0000 mL | Freq: Once | RESPIRATORY_TRACT | Status: AC
  Administered 2017-09-28: 3 mL via RESPIRATORY_TRACT
  Filled 2017-09-28: qty 3

## 2017-09-28 MED ORDER — KETOROLAC TROMETHAMINE 30 MG/ML IJ SOLN
30.0000 mg | Freq: Once | INTRAMUSCULAR | Status: AC
  Administered 2017-09-28: 30 mg via INTRAVENOUS
  Filled 2017-09-28: qty 1

## 2017-09-28 MED ORDER — ONDANSETRON HCL 4 MG/2ML IJ SOLN
4.0000 mg | Freq: Once | INTRAMUSCULAR | Status: AC
  Administered 2017-09-28: 4 mg via INTRAVENOUS
  Filled 2017-09-28: qty 2

## 2017-09-28 NOTE — Discharge Instructions (Signed)
Tamiflu as prescribed.  Ibuprofen 600 mg rotated with Tylenol 1000 mg every 4 hours as needed for pain or fever.  Robitussin with codeine as prescribed as needed for cough.  Albuterol inhaler: Use 2 puffs every 4 hours as needed for wheezing or difficulty breathing.  Return to the emergency department if symptoms significantly worsen or change.

## 2017-09-28 NOTE — ED Provider Notes (Signed)
[facility] DEPT Provider Note   CSN: [id number] Arrival date & time: 09/27/17  1859     History   Chief Complaint Chief Complaint  Patient presents with  . Emesis  . Chest Pain    HPI [name] is a 67 y.o. female.  The patient is a 67 year old female with past medical history of lupus, chronic renal insufficiency, arthritis, hypertension.  She presents today for evaluation of cough, body aches, nausea, vomiting, and tightness in her chest.  This started approximately 12 hours ago.  She does report being around her grandson who was recently diagnosed with the flu.  She does report getting a flu shot this year.   The history is provided by the patient.  Emesis   This is a new problem. Episode onset: 12 hours ago. The problem occurs continuously. The problem has been rapidly worsening. The emesis has an appearance of stomach contents. There has been no fever.    Past Medical History:  Diagnosis Date  . Arthritis    R knee, hands   . Arthritis   . Blood transfusion    1986- post-partial hysterectomy  . Cancer ([place])   . Chronic kidney disease   . GERD (gastroesophageal reflux disease)    recently taken off anti reflux tx  . Heart murmur    functional murmur, echo in [place]- long time ago   . Heart murmur   . Hepatitis    Hep. A- 1978  . Hepatitis   . Hypertension   . Lupus   . No pertinent past medical history    will see cardiac for review this week, 08/24/2011  . Personal history of radiation therapy 2009  . Renal disorder   . rt breast ca dx'd 12/2007   xrt comp;    Patient Active Problem List   Diagnosis Date Noted  . Chest pain 10/05/2014  . Hypokalemia 10/05/2014  . Palpitations 09/09/2013  . Osteoarthritis of right knee 08/29/2011  . OTITIS MEDIA, RIGHT 01/22/2010  . OBESITY 12/24/2009  . CERUMEN IMPACTION, RIGHT 12/03/2009  . NEUROPATHY 09/04/2009  . FATIGUE 09/04/2009  . COUGH 09/04/2009  . ARTHRALGIA 04/22/2009    . DYSURIA 04/22/2009  . Malignant neoplasm of upper-outer quadrant of right breast in female, estrogen receptor positive ([place]) 01/11/2008  . HIATAL HERNIA 12/14/2007  . DIVERTICULOSIS, COLON 12/14/2007  . CONSTIPATION 12/14/2007  . MAMMOGRAM, ABNORMAL, RIGHT 12/14/2007  . GERD 11/24/2007  . RHEUMATIC FEVER 11/03/2007  . HYPERTENSION, BENIGN ESSENTIAL 11/03/2007  . LUPUS 11/03/2007  . HIP PAIN, RIGHT 11/03/2007  . ABDOMINAL PAIN 11/03/2007    Past Surgical History:  Procedure Laterality Date  . ABDOMINAL HYSTERECTOMY     1986  . ABDOMINAL HYSTERECTOMY    . APPENDECTOMY     1978 & tubal ligation   . APPENDECTOMY    . BREAST EXCISIONAL BIOPSY Right 05/2008  . BREAST EXCISIONAL BIOPSY Left 1998  . BREAST SURGERY     R breast lumpectomy  . BREAST SURGERY    . CHOLECYSTECTOMY     1978- open   . CHOLECYSTECTOMY    . JOINT REPLACEMENT     L knee replacement- 2006, arthroscopic surg. both knees   . JOINT REPLACEMENT    . TONSILLECTOMY     1954  . TONSILLECTOMY    . TOTAL KNEE ARTHROPLASTY  08/30/2011   Procedure: TOTAL KNEE ARTHROPLASTY;  Surgeon: Kerin Salen;  Location: [facility];  Service: Orthopedics;  Laterality: Right;  Right Total  Knee Arthroplasty  . TUBAL LIGATION      OB History    Gravida Para Term Preterm AB Living   0 0 0 0 0     SAB TAB Ectopic Multiple Live Births   0 0 0           Home Medications    Prior to Admission medications   Medication Sig Start Date End Date Taking? Authorizing Provider  cyclobenzaprine (FLEXERIL) 5 MG tablet Take 1 tablet (5 mg total) by mouth 3 (three) times daily as needed for muscle spasms. Patient not taking: Reported on 05/07/2017 07/06/16   Drenda Freeze, MD  hydrochlorothiazide (HYDRODIURIL) 25 MG tablet Take 25 mg by mouth every morning.     [provider]  HYDROcodone-acetaminophen (NORCO/VICODIN) 5-325 MG tablet Take 1 tablet by mouth every 6 (six) hours as needed for severe pain. Patient not taking:  Reported on 05/07/2017 07/06/16   Drenda Freeze, MD  ibuprofen (ADVIL,MOTRIN) 800 MG tablet Take 1 tablet (800 mg total) by mouth every 8 (eight) hours as needed for moderate pain. 02/27/15   Drenda Freeze, MD  metoprolol succinate (TOPROL-XL) 25 MG 24 hr tablet Take 50 mg by mouth every morning.    [provider]  nitrofurantoin, macrocrystal-monohydrate, (MACROBID) 100 MG capsule Take 1 capsule (100 mg total) by mouth daily. Patient not taking: Reported on 05/07/2017 02/04/17   Magrinat, Virgie Dad, MD  omeprazole (PRILOSEC) 40 MG capsule Take 1 capsule (40 mg total) by mouth daily. Patient not taking: Reported on 05/07/2017 07/21/15   Laurie Panda, NP  tamoxifen (NOLVADEX) 20 MG tablet Take 1 tablet (20 mg total) by mouth daily. 11/25/16   Magrinat, Virgie Dad, MD  traZODone (DESYREL) 50 MG tablet Take 50 mg by mouth at bedtime.  04/16/16   [provider]    Family History Family History  Problem Relation Age of Onset  . Heart attack Father   . Cancer Other   . Hypertension Other   . Anesthesia problems Neg Hx   . Hypotension Neg Hx   . Malignant hyperthermia Neg Hx   . Pseudochol deficiency Neg Hx     Social History Social History   Tobacco Use  . Smoking status: Never Smoker  . Smokeless tobacco: Never Used  Substance Use Topics  . Alcohol use: No  . Drug use: No     Allergies   Iodine; Iohexol; Latex; Penicillins; Penicillins; Tetracycline; Iodine; and Milk-related compounds   Review of Systems Review of Systems  All other systems reviewed and are negative.    Physical Exam Updated Vital Signs BP 137/88 (BP Location: Left Arm)   Pulse (!) 108   Temp 99.6 F (37.6 C) (Oral)   Resp 18   SpO2 96%   Physical Exam  Constitutional: She is oriented to person, place, and time. She appears well-developed and well-nourished. No distress.  HENT:  Head: Normocephalic and atraumatic.  Neck: Normal range of motion. Neck supple.    Cardiovascular: Normal rate and regular rhythm. Exam reveals no gallop and no friction rub.  No murmur heard. Pulmonary/Chest: Effort normal and breath sounds normal. No respiratory distress. She has no wheezes.  Abdominal: Soft. Bowel sounds are normal. She exhibits no distension. There is no tenderness.  Musculoskeletal: Normal range of motion.  Lymphadenopathy:    She has no cervical adenopathy.  Neurological: She is alert and oriented to person, place, and time.  Skin: Skin is warm and dry. She is not  diaphoretic.  Nursing note and vitals reviewed.    ED Treatments / Results  Labs (all labs ordered are listed, but only abnormal results are displayed) Labs Reviewed  BASIC METABOLIC PANEL - Abnormal; Notable for the following components:      Result Value   Sodium 132 (*)    Potassium 3.0 (*)    Chloride 99 (*)    Glucose, Bld 108 (*)    All other components within normal limits  CBC  TROPONIN I  I-STAT TROPONIN, ED    EKG  EKG Interpretation  Date/Time:  Tuesday September 27 2017 19:16:57 EST Ventricular Rate:  95 PR Interval:    QRS Duration: 87 QT Interval:  358 QTC Calculation: 453 R Axis:   52 Text Interpretation:  Sinus rhythm Multiple ventricular premature complexes Borderline repolarization abnormality Baseline wander in lead(s) V1 V6 Confirmed by Veryl Speak [id number]) on 09/28/2017 3:57:40 AM       Radiology Dg Chest 2 View  Result Date: 09/27/2017 CLINICAL DATA:  Chest pain and dyspnea onset today. EXAM: CHEST  2 VIEW COMPARISON:  05/07/2017 FINDINGS: The heart size and mediastinal contours are within normal limits. Stable aortic atherosclerosis with slight uncoiling of the aorta at the aortic arch. Both lungs are clear. The visualized skeletal structures are unremarkable. IMPRESSION: No active cardiopulmonary disease. Electronically Signed   By: Ashley Royalty M.D.   On: 09/27/2017 20:17    Procedures Procedures (including critical care time)  Medications  Ordered in ED Medications  sodium chloride 0.9 % bolus 1,000 mL (not administered)  ondansetron (ZOFRAN) injection 4 mg (not administered)  ketorolac (TORADOL) 30 MG/ML injection 30 mg (not administered)  guaiFENesin-codeine 100-10 MG/5ML solution 10 mL (not administered)     Initial Impression / Assessment and Plan / ED Course  I have reviewed the triage vital signs and the nursing notes.  Pertinent labs & imaging results that were available during my care of the patient were reviewed by me and considered in my medical decision making (see chart for details).  Patient presents with a 12-hour history of URI-like symptoms that started abruptly.  She reports body aches, headache, and cough.  She did receive a flu shot this year, however her symptoms do sound influenza-like.  Cardiac workup is unremarkable and she is feeling better after a nebulizer treatment.  At this point, I see no indication for further workup.  Her exam is nonfocal and oxygen saturations are 100%.  She will be treated with Tamiflu, Robitussin with codeine, and albuterol MDI, and follow-up as needed.  Final Clinical Impressions(s) / ED Diagnoses   Final diagnoses:  None    ED Discharge Orders    None       Veryl Speak, MD 09/28/17 [phone]

## 2017-10-03 ENCOUNTER — Other Ambulatory Visit: Payer: Self-pay | Admitting: *Deleted

## 2017-10-03 DIAGNOSIS — C50912 Malignant neoplasm of unspecified site of left female breast: Secondary | ICD-10-CM

## 2017-10-03 DIAGNOSIS — Z170 Estrogen receptor positive status [ER+]: Principal | ICD-10-CM

## 2017-10-03 MED ORDER — TAMOXIFEN CITRATE 20 MG PO TABS: 20.0000 mg | ORAL_TABLET | Freq: Every day | ORAL | 2 refills | Status: DC

## 2017-10-03 MED ORDER — METOPROLOL SUCCINATE ER 25 MG PO TB24: 50.0000 mg | ORAL_TABLET | Freq: Every morning | ORAL | 1 refills | Status: DC

## 2017-10-24 ENCOUNTER — Inpatient Hospital Stay: Payer: Medicare HMO | Attending: Oncology

## 2017-10-24 ENCOUNTER — Ambulatory Visit (HOSPITAL_BASED_OUTPATIENT_CLINIC_OR_DEPARTMENT_OTHER): Payer: Medicare HMO | Admitting: Genetics

## 2017-10-24 ENCOUNTER — Encounter: Payer: Self-pay | Admitting: Genetics

## 2017-10-24 DIAGNOSIS — Z170 Estrogen receptor positive status [ER+]: Secondary | ICD-10-CM | POA: Diagnosis not present

## 2017-10-24 DIAGNOSIS — Z85038 Personal history of other malignant neoplasm of large intestine: Secondary | ICD-10-CM | POA: Insufficient documentation

## 2017-10-24 DIAGNOSIS — C50912 Malignant neoplasm of unspecified site of left female breast: Secondary | ICD-10-CM

## 2017-10-24 DIAGNOSIS — Z803 Family history of malignant neoplasm of breast: Secondary | ICD-10-CM | POA: Diagnosis not present

## 2017-10-24 DIAGNOSIS — C50411 Malignant neoplasm of upper-outer quadrant of right female breast: Secondary | ICD-10-CM | POA: Diagnosis not present

## 2017-10-24 HISTORY — DX: Family history of malignant neoplasm of breast: Z80.3

## 2017-10-24 NOTE — Progress Notes (Signed)
ONCOLOGY PROVIDER: Chauncey Cruel, MD [place], [place] [address]  PRIMARY PROVIDER:  Nanci Pina, FNP  PRIMARY REASON FOR VISIT:  1. Malignant neoplasm of upper-outer quadrant of right breast in female, estrogen receptor positive ([place])   2. Family history of breast cancer   3. Malignant neoplasm of left breast in female, estrogen receptor positive, unspecified site of breast ([place])     HISTORY OF PRESENT ILLNESS:   [name], a 67 y.o. female, was seen for a [facility] cancer genetics consultation via self referral due to a personal and family history of cancer.  [name] presents to clinic today to discuss the possibility of a hereditary predisposition to cancer, genetic testing, and to further clarify her future cancer risks, as well as potential cancer risks for family members.  Today we discussed updated genetic testing that includes analysis of a panel of cancer risk genes.  In 2009, at the age of 58, [name] was diagnosed with Invasive Lobular Carcinoma ER+, PR+ of the right breast. This was treated with right lumpectomy followed by adjuvant radiation and adjuvant anti-estrogen therapy.  She is followed by Dr. Jana Hakim.  [name] had genetic testing at the time of her initial diagnosis for the BRCA1 and BRCA2 genes only- this test result was negative. This testing was performed at [facility].   She reports she also was diagnosed with a left breast cancer previously in her early 68's.  She reports she was diagnosed with colon cancer in 2003 at the age of 23.  She reports this cancer was diagnosed early and she received chemotherapy for this diagnosis.     HORMONAL RISK FACTORS:  Menarche was at age 5.  First live birth at age 28.  Ovaries intact: yes.  Hysterectomy: yes. Partial in 1986 Menopausal status: postmenopausal.  Colonoscopy: yes; reportedly normal since colon cancer diagnosis in 2003. Goes every 5 years, reports she is due for  a colonoscopy.  Mammogram within the last year: yes.   Past Medical History:  Diagnosis Date  . Arthritis    R knee, hands   . Arthritis   . Blood transfusion    1986- post-partial hysterectomy  . Cancer ([place])   . Chronic kidney disease   . Family history of breast cancer 10/24/2017  . GERD (gastroesophageal reflux disease)    recently taken off anti reflux tx  . Heart murmur    functional murmur, echo in [place]- long time ago   . Heart murmur   . Hepatitis    Hep. A- 1978  . Hepatitis   . Hypertension   . Lupus   . No pertinent past medical history    will see cardiac for review this week, 08/24/2011  . Personal history of radiation therapy 2009  . Renal disorder   . rt breast ca dx'd 12/2007   xrt comp;    Past Surgical History:  Procedure Laterality Date  . ABDOMINAL HYSTERECTOMY     1986  . ABDOMINAL HYSTERECTOMY    . APPENDECTOMY     1978 & tubal ligation   . APPENDECTOMY    . BREAST EXCISIONAL BIOPSY Right 05/2008  . BREAST EXCISIONAL BIOPSY Left 1998  . BREAST SURGERY     R breast lumpectomy  . BREAST SURGERY    . CHOLECYSTECTOMY     1978- open   . CHOLECYSTECTOMY    . JOINT REPLACEMENT     L knee replacement- 2006, arthroscopic surg. both knees   .  JOINT REPLACEMENT    . TONSILLECTOMY     1954  . TONSILLECTOMY    . TOTAL KNEE ARTHROPLASTY  08/30/2011   Procedure: TOTAL KNEE ARTHROPLASTY;  Surgeon: Kerin Salen;  Location: [facility];  Service: Orthopedics;  Laterality: Right;  Right Total Knee Arthroplasty  . TUBAL LIGATION      Social History   Socioeconomic History  . Marital status: Married    Spouse name: Not on file  . Number of children: Not on file  . Years of education: Not on file  . Highest education level: Not on file  Social Needs  . Financial resource strain: Not on file  . Food insecurity - worry: Not on file  . Food insecurity - inability: Not on file  . Transportation needs - medical: Not on file  . Transportation needs -  non-medical: Not on file  Occupational History  . Not on file  Tobacco Use  . Smoking status: Never Smoker  . Smokeless tobacco: Never Used  Substance and Sexual Activity  . Alcohol use: No  . Drug use: No  . Sexual activity: Yes  Other Topics Concern  . Not on file  Social History Narrative   ** Merged History Encounter **         FAMILY HISTORY:  We obtained a detailed, 4-generation family history.  Significant diagnoses are listed below: Family History  Problem Relation Age of Onset  . Heart attack Father   . Cancer Other   . Hypertension Other   . Breast cancer Mother   . Breast cancer Sister 8  . Anesthesia problems Neg Hx   . Hypotension Neg Hx   . Malignant hyperthermia Neg Hx   . Pseudochol deficiency Neg Hx    [name] has 2 sons and 2 daughters.  None of her children have any history of cancer.  She has 7 grandchildren with no history of cancer.   [name] father died at the age 107 due to a heart attack.  [name] has a paternal aunt who died of cancer in her 34's the type is unknown. This aunt had a daughter who died at 100 due to a heart attack. [name] paternal grandfather died when her father was 53 with no history of cancer.  [name] paternal grandmother died at 22 due to cancer, the type is unknown.   [name] mother died at 46 and had breast caner that was diagnosed in her 79's.   [name] has 9 maternal aunts.  One was diagnosed with breast cancer in her 73's and died at 19.  [name] also has 10 maternal uncles.  No history of cancer in any maternal cousins.  [name] maternal grandmother died at 94 and had a brain tumor.  However, due to her age they never biopsied or did further testing of this tumor. [name] maternal grandfather died in old age with no history of cancer. He died of pneumonia.    [name] is unaware of previous family history of genetic testing for hereditary cancer risks. Patient's maternal  ancestors are of Native [place] descent, and paternal ancestors are of Irish/African American descent. There is no reported Ashkenazi Jewish ancestry. There is no known consanguinity.  GENETIC COUNSELING ASSESSMENT: [name] is a 67 y.o. female with a personal and family history which is somewhat suggestive of a Hereditary Cancer Predisposition Syndrome. We, therefore, discussed and recommended the following at today's visit.   DISCUSSION: We reviewed  the characteristics, features and inheritance patterns of hereditary cancer syndromes. We also discussed genetic testing, including the appropriate family members to test, the process of testing, insurance coverage and turn-around-time for results. We discussed the implications of a negative, positive and/or variant of uncertain significant result. We recommended [name] pursue genetic testing for the Common Hereditary Cancer gene panel.   We discussed that only 5-10% of cancers are associated with a Hereditary cancer predisposition syndrome.  One of the most common hereditary cancer syndromes that increases breast cancer risk is called Hereditary Breast and Ovarian Cancer (HBOC) syndrome.  This syndrome is caused by mutations in the BRCA1 and BRCA2 genes.  This syndrome increases an individual's lifetime risk to develop breast, ovarian, pancreatic, and other types of cancer.  There are also many other cancer predisposition syndromes caused by mutations in several other genes.   We discussed that if she is found to have a mutation in one of these genes, it may impact future medical management recommendations such as increased cancer screenings and consideration of risk reducing surgeries.  A positive result could also have implications for the patient's family members.  A Negative result would mean we were unable to identify a hereditary component to her cancer, but does not rule out the possibility of a hereditary basis for her cancer.  There  could be mutations that are undetectable by current technology, or in genes not yet tested or identified to increase cancer risk.    We discussed the potential to find a Variant of Uncertain Significance or VUS.  These are variants that have not yet been identified as pathogenic or benign, and it is unknown if this variant is associated with increased cancer risk or if this is a normal finding.  Most VUS's are reclassified to benign or likely benign.   It should not be used to make medical management decisions. With time, we suspect the lab will determine the significance of any VUS's identified if any.   Based on [name]'s personal and family history of cancer, she meets medical criteria for genetic testing. Despite that she meets criteria, she may still have an out of pocket cost. We discussed that if her out of pocket cost for testing is over $100, the laboratory will call and confirm whether she wants to proceed with testing.  If the out of pocket cost of testing is less than $100 she will be billed by the genetic testing laboratory.   PLAN: After considering the risks, benefits, and limitations, Ms. Zorn  provided informed consent to pursue genetic testing and the blood sample was sent to [facility] for analysis of the Common Hereditary Cancer Panel. Results should be available within approximately 2-3 weeks' time, at which point they will be disclosed by telephone to Ms. Singleton, as will any additional recommendations warranted by these results. Ms. Economou will receive a summary of her genetic counseling visit and a copy of her results once available. This information will also be available in Epic. We encouraged Ms. Kiehn to remain in contact with cancer genetics annually so that we can continuously update the family history and inform her of any changes in cancer genetics and testing that may be of benefit for her family. Ms. Silveira questions were answered to her satisfaction  today. Our contact information was provided should additional questions or concerns arise.  Lastly, we encouraged Ms. Vezina to remain in contact with cancer genetics annually so that we can continuously update the family history and  inform her of any changes in cancer genetics and testing that may be of benefit for this family.   Ms.  Alaniz questions were answered to her satisfaction today. Our contact information was provided should additional questions or concerns arise. Thank you for the referral and allowing [name] to share in the care of your patient.   Tana Felts, MS Genetic Counselor [email or web address] phone: [phone]  The patient was seen for a total of 40 minutes in face-to-face genetic counseling.

## 2017-11-24 ENCOUNTER — Ambulatory Visit: Payer: Self-pay | Admitting: Genetics

## 2017-11-24 ENCOUNTER — Encounter: Payer: Self-pay | Admitting: Genetics

## 2017-11-24 DIAGNOSIS — Z803 Family history of malignant neoplasm of breast: Secondary | ICD-10-CM

## 2017-11-24 DIAGNOSIS — Z170 Estrogen receptor positive status [ER+]: Secondary | ICD-10-CM

## 2017-11-24 DIAGNOSIS — Z1379 Encounter for other screening for genetic and chromosomal anomalies: Secondary | ICD-10-CM | POA: Insufficient documentation

## 2017-11-24 DIAGNOSIS — C50912 Malignant neoplasm of unspecified site of left female breast: Secondary | ICD-10-CM

## 2017-11-24 DIAGNOSIS — C50411 Malignant neoplasm of upper-outer quadrant of right female breast: Secondary | ICD-10-CM

## 2017-11-24 DIAGNOSIS — Z85038 Personal history of other malignant neoplasm of large intestine: Secondary | ICD-10-CM

## 2017-11-24 NOTE — Progress Notes (Signed)
HPI: [name] was previously seen in the [facility] on 10/24/2017 due to a personal and family history of breast/colon cancer and concerns regarding a hereditary predisposition to cancer. Please refer to our prior cancer genetics clinic note for more information regarding [name]'s medical, social and family histories, and our assessment and recommendations, at the time. [name] recent genetic test results were disclosed to her, as well as recommendations warranted by these results. These results and recommendations are discussed in more detail below.  CANCER HISTORY:   No history exists.     FAMILY HISTORY:  We obtained a detailed, 4-generation family history.  Significant diagnoses are listed below: Family History  Problem Relation Age of Onset  . Heart attack Father   . Cancer Other   . Hypertension Other   . Breast cancer Mother 51  . Breast cancer Maternal Aunt 59  . Cancer Paternal Aunt 104       type unk  . Other Maternal Grandmother 102       brain tumor dx at 88- did not bx or do additional testing on tumor  . Cancer Paternal Grandmother 103       type unk  . Anesthesia problems Neg Hx   . Hypotension Neg Hx   . Malignant hyperthermia Neg Hx   . Pseudochol deficiency Neg Hx     [name] has 2 sons and 2 daughters.  None of her children have any history of cancer.  She has 7 grandchildren with no history of cancer.   [name] father died at the age 59 due to a heart attack.  [name] has a paternal aunt who died of cancer in her 45's the type is unknown. This aunt had a daughter who died at 76 due to a heart attack. [name] paternal grandfather died when her father was 4 with no history of cancer.  [name] paternal grandmother died at 50 due to cancer, the type is unknown.   [name] mother died at 51 and had breast caner that was diagnosed in her 70's.   [name] has 9 maternal aunts.  One was diagnosed with  breast cancer in her 108's and died at 31.  [name] also has 10 maternal uncles.  No history of cancer in any maternal cousins.  [name] maternal grandmother died at 2 and had a brain tumor.  However, due to her age they never biopsied or did further testing of this tumor. [name] maternal grandfather died in old age with no history of cancer. He died of pneumonia.    [name] is unaware of previous family history of genetic testing for hereditary cancer risks. Patient's maternal ancestors are of Native [place] descent, and paternal ancestors are of Irish/African American descent. There is no reported Ashkenazi Jewish ancestry. There is no known consanguinity.  GENETIC TEST RESULTS: Genetic testing performed through [facility]'s Common Hereditary Caners Panel reported out on 10/31/2017 showed no pathogenic mutations. The Common Hereditary Cancer Panel offered by [facility] includes sequencing and/or deletion duplication testing of the following 47 genes: APC, ATM, AXIN2, BARD1, BMPR1A, BRCA1, BRCA2, BRIP1, CDH1, CDKN2A (p14ARF), CDKN2A (p16INK4a), CKD4, CHEK2, CTNNA1, DICER1, EPCAM (Deletion/duplication testing only), GREM1 (promoter region deletion/duplication testing only), KIT, MEN1, MLH1, MSH2, MSH3, MSH6, MUTYH, NBN, NF1, NHTL1, PALB2, PDGFRA, PMS2, POLD1, POLE, PTEN, RAD50, RAD51C, RAD51D, SDHB, SDHC, SDHD, SMAD4, SMARCA4. STK11, TP53, TSC1, TSC2, and VHL.  The following genes were evaluated for sequence changes only:  SDHA and HOXB13 c.251G>A variant only..  A variant of uncertain significance (VUS) in a gene called SDHB was also noted. c.65G>C (p.Cys22Ser).  A variant of uncertain significance (VUS) in a gene called VHL was also noted. c.3G>A (Initiator codon)  The test report will be scanned into EPIC and will be located under the Molecular Pathology section of the Results Review tab.A portion of the result report is included below for reference.     We discussed with [name]  that because current genetic testing is not perfect, it is possible there may be a gene mutation in one of these genes that current testing cannot detect, but that chance is small. We also discussed, that there could be another gene that has not yet been discovered, or that we have not yet tested, that is responsible for the cancer diagnoses in the family. It is also possible there is a hereditary cause for the cancer in the family that [name] did not inherit and therefore was not identified in her testing.  Therefore, it is important to remain in touch with cancer genetics in the future so that we can continue to offer [name] the most up to date genetic testing.   Regarding the VUS's in SDHB and VHL: At this time, it is unknown if these variants are associated with increased cancer risk or if they are normal findings, but most variants such as these get reclassified to being inconsequential. They should not be used to make medical management decisions. With time, we suspect the lab will determine the significance of these variants, if any. If we do learn more about them, we will try to contact [name] to discuss it further. However, it is important to stay in touch with [name] periodically and keep the address and phone number up to date.  ADDITIONAL GENETIC TESTING: We discussed with Ms. Marquard that there are other genes that are associated with increased cancer risk that can be analyzed. The laboratories that offer this testing look at these additional genes via a hereditary cancer gene panel. Should Ms. Rocque wish to pursue additional genetic testing, we are happy to discuss and coordinate this testing, at any time.    CANCER SCREENING RECOMMENDATIONS:  This negative result means that we were unable to identify a hereditary cause for her personal and family history of cancer at this time.  This result does not completely rule out a hereditary basis for her cancer.   It is still possible  that there could be genetic mutations that are undetectable by current technology, or genetic mutations in genes that have not been tested or identified to increase cancer risk.  Therefore, it is recommended she continue to follow the cancer management and screening guidelines provided by her oncology and primary healthcare provider. Other factors such as her personal and family history may still affect her cancer risk.  RECOMMENDATIONS FOR FAMILY MEMBERS: Women in this family might be at some increased risk of developing cancer, over the general population risk, simply due to the family history of cancer. We recommended women in this family have a yearly mammogram beginning at age 32, or 29 years younger than the earliest onset of cancer, an annual clinical breast exam, and perform monthly breast self-exams. Women in this family should also have a gynecological exam as recommended by their primary provider. All family members should have a colonoscopy by age 79.  All family members should inform their physicians about the family history of cancer so  their doctors can make the most appropriate screening recommendations for them.   FOLLOW-UP: Lastly, we discussed with Ms. Jann that cancer genetics is a rapidly advancing field and it is possible that new genetic tests will be appropriate for her and/or her family members in the future. We encouraged her to remain in contact with cancer genetics on an annual basis so we can update her personal and family histories and let her know of advances in cancer genetics that may benefit this family.   Our contact number was provided. Ms. Mayhall questions were answered to her satisfaction, and she knows she is welcome to call us at anytime with additional questions or concerns.   Ferol Luz, MS, [facility] Certified Genetic Counselor [email or web address]

## 2018-01-27 ENCOUNTER — Encounter: Payer: Self-pay | Admitting: Nurse Practitioner

## 2018-02-07 NOTE — Progress Notes (Signed)
[name]  [phone] [phone]     ID: [name] DOB: May 30, 1951  MR#: [id number]  DDU#:[id number]  Patient Care Team: Nanci Pina, FNP as PCP - General (Nurse Practitioner) Gavin Pound, MD as Consulting Physician (Rheumatology) PCP: Nanci Pina, FNP GYN: SU:  OTHER MD: Arloa Koh MD  CHIEF COMPLAINT: Estrogen receptor positive breast cancer  CURRENT TREATMENT: tamoxifen    BREAST CANCER HISTORY: From Dr. Dana Allan earlier summary:  "#1 patient is status post lumpectomy with sentinel lymph node biopsy followed by radiation to the right breast. In December 2009 patient began aromatase inhibitor but could not tolerate it.  #2 therefore she began in March 2010 tamoxifen 20 mg daily. However she has experienced significant amount of aches and pains and in December 2012 discontinued it. At this time she does not want to go back on any kind of antiestrogen therapy due to side effects.  #3 patient would like to go back on tamoxifen 20 mg daily. She recently attended finding your new normal seminar. And because of that she wants to she would like to go back on the tamoxifen. We discussed side effects again. She was sent to her pharmacy."  Her subsequent history is as detailed below  INTERVAL HISTORY: [name] returns today for follow-up of her estrogen receptor positive breast cancer. She continues on tamoxifen, with good tolerance. She has regular hot flashes, but they have improved in intensity over time. She has some increased vaginal discharge, but this is not a major issue for her.  Since her last visit, she underwent routine screening bilateral mammography with CAD and tomography on 06/27/2017 at [facility] showing: breast density category C. There was no evidence of malignancy.   She also completed a chest xray on 09/27/2017 for chest pain and dyspnea showing: No active cardiopulmonary disease.   REVIEW OF  SYSTEMS: [name] reports that Dr. Trudie Reed helps manage her lupus. She reports that within the last 3 weeks her hands have been swelling and pain in both wrists. She also has a rash on her face and back, but she thinks this could be due to the heat outside. She plans to make an appointment with Dr. Trudie Reed soon. For exercise, she walks and goes to aerobics classes. She denies unusual headaches, visual changes, nausea, vomiting, or dizziness. There has been no unusual cough, phlegm production, or pleurisy. This been no change in bowel or bladder habits. She denies unexplained fatigue or unexplained weight loss, bleeding, rash, or fever. A detailed review of systems was otherwise stable.    PAST MEDICAL HISTORY: Past Medical History:  Diagnosis Date  . Arthritis    R knee, hands   . Arthritis   . Blood transfusion    1986- post-partial hysterectomy  . Breast cancer ([place])   . Cancer ([place])   . Chronic kidney disease   . Colon cancer ([place])   . Family history of breast cancer 10/24/2017  . GERD (gastroesophageal reflux disease)    recently taken off anti reflux tx  . Heart murmur    functional murmur, echo in [place]- long time ago   . Heart murmur   . Hepatitis    Hep. A- 1978  . Hepatitis   . Hypertension   . Lupus   . No pertinent past medical history    will see cardiac for review this week, 08/24/2011  . Personal history of colon cancer 10/24/2017  . Personal history of radiation therapy 2009  .  Renal disorder   . rt breast ca dx'd 12/2007   xrt comp;    PAST SURGICAL HISTORY: Past Surgical History:  Procedure Laterality Date  . ABDOMINAL HYSTERECTOMY     1986  . ABDOMINAL HYSTERECTOMY    . APPENDECTOMY     1978 & tubal ligation   . APPENDECTOMY    . BREAST EXCISIONAL BIOPSY Right 05/2008  . BREAST EXCISIONAL BIOPSY Left 1998  . BREAST SURGERY     R breast lumpectomy  . BREAST SURGERY    . CHOLECYSTECTOMY     1978- open   . CHOLECYSTECTOMY    . JOINT REPLACEMENT     L knee  replacement- 2006, arthroscopic surg. both knees   . JOINT REPLACEMENT    . TONSILLECTOMY     1954  . TONSILLECTOMY    . TOTAL KNEE ARTHROPLASTY  08/30/2011   Procedure: TOTAL KNEE ARTHROPLASTY;  Surgeon: Kerin Salen;  Location: [facility];  Service: Orthopedics;  Laterality: Right;  Right Total Knee Arthroplasty  . TUBAL LIGATION      FAMILY HISTORY Family History  Problem Relation Age of Onset  . Heart attack Father   . Cancer Other   . Hypertension Other   . Breast cancer Mother 22  . Breast cancer Maternal Aunt 59  . Cancer Paternal Aunt 15       type unk  . Other Maternal Grandmother 102       brain tumor dx at 25- did not bx or do additional testing on tumor  . Cancer Paternal Grandmother 34       type unk  . Anesthesia problems Neg Hx   . Hypotension Neg Hx   . Malignant hyperthermia Neg Hx   . Pseudochol deficiency Neg Hx    the patient's father died from a heart attack at the age of 50. The patient's mother died at the age of 40. She had a history of breast cancer and one of her sisters was diagnosed with breast cancer at the age of 54. The patient had 5 brothers and 5 sisters. There is no other history of breast or ovarian cancer in the family to her knowledge.  GYNECOLOGIC HISTORY:  No LMP recorded. Patient has had a hysterectomy. Menarche age 89, first live birth age 67. The patient is GX P3. She had a simple hysterectomy in 1986 (no salpingo-oophorectomy.  SOCIAL HISTORY:  [name] is 1 of our [employer] volunteers.  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 She remarried in 2011. Her husband Laveda Abbe is retired from [employer]. The patient has one adopted son in addition to her own children. 3 of her children live in town 1 in [place]. She has 6 grandchildren and one on the way grandchildren. She attends the [employer] locally---She is one of our [employer] volunteers    ADVANCED DIRECTIVES: Not in place   HEALTH  MAINTENANCE: Social History   Tobacco Use  . Smoking status: Never Smoker  . Smokeless tobacco: Never Used  Substance Use Topics  . Alcohol use: No  . Drug use: No     Colonoscopy:  PAP:  Bone density: 04/30/2008 was normal  Lipid panel:  Allergies  Allergen Reactions  . Iodine Anaphylaxis  . Iohexol Anaphylaxis     Code: HIVES, Desc: PT IS ALLERGIC TO IODINATED CONTRAST; REACTION INCLUDES FACIAL SWELLING,HIVES,RESPIRATORY DISTRESS;PT HASN'T HAD SCAN W/PREMEDS.;REFUSED CONTRAST OF ANY KIND!  KR   .  Latex Anaphylaxis  . Penicillins Anaphylaxis  . Penicillins Rash  . Tetracycline Anaphylaxis  . Iodine   . Milk-Related Compounds     Lactose intolerant    Current Outpatient Medications  Medication Sig Dispense Refill  . cholecalciferol (VITAMIN D) 1000 units tablet Take 1,000 Units by mouth daily.    . cyclobenzaprine (FLEXERIL) 5 MG tablet Take 1 tablet (5 mg total) by mouth 3 (three) times daily as needed for muscle spasms. (Patient not taking: Reported on 05/07/2017) 10 tablet 0  . hydrochlorothiazide (HYDRODIURIL) 25 MG tablet Take 25 mg by mouth every morning.     . metoprolol succinate (TOPROL-XL) 25 MG 24 hr tablet Take 2 tablets (50 mg total) by mouth every morning. 60 tablet 1  . omeprazole (PRILOSEC) 40 MG capsule Take 1 capsule (40 mg total) by mouth daily. 30 capsule 5  . tamoxifen (NOLVADEX) 20 MG tablet Take 1 tablet (20 mg total) by mouth daily. 90 tablet 4   No current facility-administered medications for this visit.     OBJECTIVE: Middle-aged African-American woman who appears stated age 25:   02/17/18 0844  BP: (!) 141/77  Pulse: 67  Resp: 18  Temp: 98 F (36.7 C)  SpO2: 100%     Body mass index is 33.49 kg/m.    ECOG FS:1 - Symptomatic but completely ambulatory   Sclerae unicteric, pupils round and equal Oropharynx clear and moist No cervical or supraclavicular adenopathy Lungs no rales or rhonchi Heart regular rate and rhythm Abd soft,  nontender, positive bowel sounds MSK significant swelling but no erythema of the PIP joints, right greater than left, no focal spinal tenderness, no upper extremity lymphedema Neuro: nonfocal, well oriented, appropriate affect Breasts: On the right the patient is status post lumpectomy and radiation.  There is no evidence of local recurrence.  The left breast is benign.  Both axillae are benign.  LAB RESULTS:  CMP     Component Value Date/Time   NA 132 (L) 09/27/2017 1942   NA 141 01/26/2017 1508   K 3.0 (L) 09/27/2017 1942   K 3.4 (L) 01/26/2017 1508   CL 99 (L) 09/27/2017 1942   CL 102 01/25/2013 0909   CO2 23 09/27/2017 1942   CO2 29 01/26/2017 1508   GLUCOSE 108 (H) 09/27/2017 1942   GLUCOSE 91 01/26/2017 1508   GLUCOSE 81 01/25/2013 0909   BUN 6 09/27/2017 1942   BUN 11.5 01/26/2017 1508   CREATININE 0.76 09/27/2017 1942   CREATININE 0.8 01/26/2017 1508   CALCIUM 9.1 09/27/2017 1942   CALCIUM 9.5 01/26/2017 1508   PROT 6.1 (L) 05/07/2017 1239   PROT 7.1 01/26/2017 1508   ALBUMIN 3.1 (L) 05/07/2017 1239   ALBUMIN 3.5 01/26/2017 1508   AST 20 05/07/2017 1239   AST 22 01/26/2017 1508   ALT 16 05/07/2017 1239   ALT 17 01/26/2017 1508   ALKPHOS 56 05/07/2017 1239   ALKPHOS 72 01/26/2017 1508   BILITOT 0.2 (L) 05/07/2017 1239   BILITOT 0.38 01/26/2017 1508   GFRNONAA >60 09/27/2017 1942   GFRAA >60 09/27/2017 1942    INo results found for: SPEP, UPEP  Lab Results  Component Value Date   WBC 4.6 09/27/2017   NEUTROABS 1.8 05/07/2017   HGB 13.5 09/27/2017   HCT 39.8 09/27/2017   MCV 90.7 09/27/2017   PLT 199 09/27/2017      Chemistry      Component Value Date/Time   NA 132 (L) 09/27/2017 1942   NA  141 01/26/2017 1508   K 3.0 (L) 09/27/2017 1942   K 3.4 (L) 01/26/2017 1508   CL 99 (L) 09/27/2017 1942   CL 102 01/25/2013 0909   CO2 23 09/27/2017 1942   CO2 29 01/26/2017 1508   BUN 6 09/27/2017 1942   BUN 11.5 01/26/2017 1508   CREATININE 0.76 09/27/2017  1942   CREATININE 0.8 01/26/2017 1508      Component Value Date/Time   CALCIUM 9.1 09/27/2017 1942   CALCIUM 9.5 01/26/2017 1508   ALKPHOS 56 05/07/2017 1239   ALKPHOS 72 01/26/2017 1508   AST 20 05/07/2017 1239   AST 22 01/26/2017 1508   ALT 16 05/07/2017 1239   ALT 17 01/26/2017 1508   BILITOT 0.2 (L) 05/07/2017 1239   BILITOT 0.38 01/26/2017 1508       Lab Results  Component Value Date   LABCA2 16 11/09/2010    No components found for: [id number]  No results for input(s): INR in the last 168 hours.  Urinalysis    Component Value Date/Time   COLORURINE YELLOW 10/07/2014 [place] 10/07/2014 1605   LABSPEC 1.015 01/26/2017 1523   PHURINE 7.0 01/26/2017 1523   PHURINE 6.0 10/07/2014 1605   GLUCOSEU Negative 01/26/2017 1523   HGBUR Trace 01/26/2017 1523   HGBUR TRACE (A) 10/07/2014 1605   HGBUR negative 01/22/2010 1551   BILIRUBINUR Negative 01/26/2017 1523   KETONESUR Negative 01/26/2017 1523   KETONESUR NEGATIVE 10/07/2014 1605   PROTEINUR < 30 01/26/2017 1523   PROTEINUR NEGATIVE 10/07/2014 1605   UROBILINOGEN 0.2 01/26/2017 1523   NITRITE Negative 01/26/2017 1523   NITRITE NEGATIVE 10/07/2014 1605   LEUKOCYTESUR Small 01/26/2017 1523    STUDIES: Since her last visit, she underwent routine screening bilateral mammography with CAD and tomography on 06/27/2017 at [facility] showing: breast density category C. There was no evidence of malignancy.   She also completed a chest xray on 09/27/2017 for chest pain and dyspnea showing: No active cardiopulmonary disease.   ASSESSMENT: 67 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  (2) status post right lumpectomy 03/20/2008 for invasive lobular carcinoma, multifocal, pT1a pNX, stage IA, grade 2, strongly estrogen and  progesterone receptor positive, HER-2 negative. with negative margins.  (3) status post right axillary sentinel lymph node sampling 04/17/2008, the single sentinel lymph node being clear  (4) Oncotype score of 10 predicts an outside the breast risk of recurrence of 7% if the patient's only systemic therapy is tamoxifen for 5 years.  (5) Additional right breast biopsy 05/23/2008 to evaluate microcalcifications seen on pre-radiotherapy mammography showed only atypical lobular hyperplasia  (6) Completed radiation to the right breast 07/26/2008 (50 gray +14 gray boost).  (7) Started on anastrozole November 2009, with poor tolerance  (8). Started on tamoxifen March 2010, continued to December 2012, resumed December 2014   (a)  The plan is to continue tamoxifen  (9) status post simple hysterectomy 1986 (no salpingo-oophorectomy)  (10) genetics testing 10/31/2017 through the Common Hereditary Cancers Panel offered by [facility] found no deleterious mutations in APC, ATM, AXIN2, BARD1, BMPR1A, BRCA1, BRCA2, BRIP1, CDH1, CDKN2A (p14ARF), CDKN2A (p16INK4a), CKD4, CHEK2, CTNNA1, DICER1, EPCAM (Deletion/duplication testing only), GREM1 (promoter region deletion/duplication testing only), KIT, MEN1, MLH1, MSH2, MSH3, MSH6, MUTYH, NBN, NF1, NHTL1, PALB2, PDGFRA, PMS2, POLD1, POLE, PTEN, RAD50, RAD51C, RAD51D, SDHB, SDHC, SDHD, SMAD4, SMARCA4. STK11,  TP53, TSC1, TSC2, and VHL.  The following genes were evaluated for sequence changes only: SDHA and HOXB13 c.251G>A variant only.  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    PLAN: [name] is now just about 10 years out from definitive surgery for her breast cancer with no evidence of disease recurrence.  This is very favorable.  She is tolerating tamoxifen well.  The plan has been to continue that for a total of 10 years.  However she tells me she is very wary of coming off tamoxifen since her mother's cancer came back  shortly after discontinuing antiestrogens.  We briefly discussed the lack of data for the use of tamoxifen beyond 10 years.  She will see me again a year from now we will make a definitive decision at that time regarding whether or not she wishes to continue antiestrogens  As far as her lupus is concerned she is going to follow-up with her rheumatologist within the next week  She knows to call for any other issues that may develop before the next visit.    [name], Virgie Dad, MD  02/17/18 9:05 AM Medical Oncology and Hematology [facility] [address] [place], [place] [address] Tel. [phone]    Fax. [phone]  Alice Rieger, am acting as scribe for Chauncey Cruel MD.  [name] have reviewed the above documentation for accuracy and completeness, and I agree with the above.

## 2018-02-10 ENCOUNTER — Telehealth: Payer: Self-pay | Admitting: Oncology

## 2018-02-10 NOTE — Telephone Encounter (Signed)
GM PAL 5/30. Per GM moved lab/fu to 5/31. Left message for patient on both home/cell and mailed schedule.

## 2018-02-16 ENCOUNTER — Ambulatory Visit: Payer: Self-pay | Admitting: Oncology

## 2018-02-16 ENCOUNTER — Other Ambulatory Visit: Payer: Self-pay

## 2018-02-16 ENCOUNTER — Other Ambulatory Visit: Payer: Self-pay | Admitting: *Deleted

## 2018-02-16 DIAGNOSIS — Z170 Estrogen receptor positive status [ER+]: Principal | ICD-10-CM

## 2018-02-16 DIAGNOSIS — C50912 Malignant neoplasm of unspecified site of left female breast: Secondary | ICD-10-CM

## 2018-02-17 ENCOUNTER — Telehealth: Payer: Self-pay | Admitting: Oncology

## 2018-02-17 ENCOUNTER — Inpatient Hospital Stay: Payer: Medicare HMO

## 2018-02-17 ENCOUNTER — Encounter: Payer: Self-pay | Admitting: Nurse Practitioner

## 2018-02-17 ENCOUNTER — Other Ambulatory Visit: Payer: Self-pay | Admitting: *Deleted

## 2018-02-17 ENCOUNTER — Ambulatory Visit: Payer: Medicare HMO | Admitting: Nurse Practitioner

## 2018-02-17 ENCOUNTER — Inpatient Hospital Stay: Payer: Medicare HMO | Attending: Oncology | Admitting: Oncology

## 2018-02-17 VITALS — BP 110/68 | HR 72 | Ht 66.0 in | Wt 207.4 lb

## 2018-02-17 VITALS — BP 141/77 | HR 67 | Temp 98.0°F | Resp 18 | Ht 66.0 in | Wt 207.5 lb

## 2018-02-17 DIAGNOSIS — K219 Gastro-esophageal reflux disease without esophagitis: Secondary | ICD-10-CM

## 2018-02-17 DIAGNOSIS — Z79899 Other long term (current) drug therapy: Secondary | ICD-10-CM | POA: Diagnosis not present

## 2018-02-17 DIAGNOSIS — Z170 Estrogen receptor positive status [ER+]: Secondary | ICD-10-CM | POA: Diagnosis not present

## 2018-02-17 DIAGNOSIS — C50912 Malignant neoplasm of unspecified site of left female breast: Secondary | ICD-10-CM

## 2018-02-17 DIAGNOSIS — Z85038 Personal history of other malignant neoplasm of large intestine: Secondary | ICD-10-CM | POA: Diagnosis not present

## 2018-02-17 DIAGNOSIS — R1310 Dysphagia, unspecified: Secondary | ICD-10-CM | POA: Diagnosis not present

## 2018-02-17 DIAGNOSIS — K5900 Constipation, unspecified: Secondary | ICD-10-CM | POA: Diagnosis not present

## 2018-02-17 DIAGNOSIS — Z803 Family history of malignant neoplasm of breast: Secondary | ICD-10-CM | POA: Diagnosis not present

## 2018-02-17 DIAGNOSIS — Z923 Personal history of irradiation: Secondary | ICD-10-CM

## 2018-02-17 DIAGNOSIS — C50411 Malignant neoplasm of upper-outer quadrant of right female breast: Secondary | ICD-10-CM | POA: Insufficient documentation

## 2018-02-17 DIAGNOSIS — Z79810 Long term (current) use of selective estrogen receptor modulators (SERMs): Secondary | ICD-10-CM | POA: Diagnosis not present

## 2018-02-17 LAB — CBC WITH DIFFERENTIAL (CANCER CENTER ONLY)
Basophils Absolute: 0 10*3/uL (ref 0.0–0.1)
Basophils Relative: 1 %
Eosinophils Absolute: 0.2 10*3/uL (ref 0.0–0.5)
Eosinophils Relative: 3 %
HCT: 36.9 % (ref 34.8–46.6)
Hemoglobin: 12.4 g/dL (ref 11.6–15.9)
Lymphocytes Relative: 45 %
Lymphs Abs: 2.1 10*3/uL (ref 0.9–3.3)
MCH: 30.8 pg (ref 25.1–34.0)
MCHC: 33.7 g/dL (ref 31.5–36.0)
MCV: 91.6 fL (ref 79.5–101.0)
Monocytes Absolute: 0.6 10*3/uL (ref 0.1–0.9)
Monocytes Relative: 13 %
Neutro Abs: 1.8 10*3/uL (ref 1.5–6.5)
Neutrophils Relative %: 38 %
Platelet Count: 197 10*3/uL (ref 145–400)
RBC: 4.04 MIL/uL (ref 3.70–5.45)
RDW: 13.8 % (ref 11.2–14.5)
WBC Count: 4.8 10*3/uL (ref 3.9–10.3)

## 2018-02-17 LAB — CMP (CANCER CENTER ONLY)
ALT: 15 U/L (ref 0–55)
AST: 21 U/L (ref 5–34)
Albumin: 3.5 g/dL (ref 3.5–5.0)
Alkaline Phosphatase: 92 U/L (ref 40–150)
Anion gap: 8 (ref 3–11)
BUN: 16 mg/dL (ref 7–26)
CO2: 26 mmol/L (ref 22–29)
Calcium: 9.3 mg/dL (ref 8.4–10.4)
Chloride: 107 mmol/L (ref 98–109)
Creatinine: 0.81 mg/dL (ref 0.60–1.10)
GFR, Est AFR Am: >60 mL/min (ref >60)
GFR, Estimated: >60 mL/min (ref >60)
Glucose, Bld: 91 mg/dL (ref 70–140)
Potassium: 3.9 mmol/L (ref 3.5–5.1)
Sodium: 141 mmol/L (ref 136–145)
Total Bilirubin: <0.2 mg/dL — ABNORMAL LOW (ref 0.2–1.2)
Total Protein: 7.4 g/dL (ref 6.4–8.3)

## 2018-02-17 MED ORDER — TAMOXIFEN CITRATE 20 MG PO TABS: 20.0000 mg | ORAL_TABLET | Freq: Every day | ORAL | 4 refills | Status: DC

## 2018-02-17 NOTE — Progress Notes (Addendum)
Chief Complaint: dysphagia  Referring Provider:     Self referred   ASSESSMENT AND PLAN;    Dysphagia, solids and liquids.  Rule out esophageal dysmotility, stricture -We will start with a barium swallow with tablet -Advised patient to eat small bites, chew well with liquids in between bites to avoid food impaction.  GERD, no significant heartburn on daily PPI  Constipation x1 year.  Uses laxatives as needed -She stays well-hydrated -currently feels very constipated. Will purge bowels today with Mg+ Citrate -We will start with daily Metamucil.  Patient will call with an update on constipation in a couple of weeks.  If not improving then will add daily MiraLAX.  -Sounds like she may be due for a colonoscopy but will need to obtain colonoscopy report from Dr. Lorie Apley office to verify -Follow-up with me in a few weeks for reevaluation of constipation.  Hopefully by that time we will have her colonoscopy report.  In the meantime I will call her with the results of the barium swallow.  Diffuse achy abdominal discomfort unrelated to meals.  Patient wonders if pain related to constipation, it could be. CMET and CBC unremarkable.  Absence of weight loss, unremarkable abdominal exam are reassuring.  -We will be able to better evaluate once constipation resolved  -Follow-up with me in a few weeks  Colonoscopy report received from Dr. Lorie Apley office.  Colonoscopy done 01/06/2011 for heme positive stool, constipation.  Exam was complete including intubation of terminal ileum for short distance.  Quality the prep was excellent.  Except for a few scattered tics the exam was normal. No polyps or masses.   Will forward to Dr. 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  She has a history of breast cancer, s/p post right lumpectomy, radiation 2009.  She takes  tamoxifen.  Patient also has a history of hypertension, lupus, and GERD.  She is status post appendectomy, cholecystectomy, abdominal hysterectomy  Solids and fluids feel like they are going around some sort of obstruction in upper esophagus. Solids sometimes get lodged, fluid helps push them down. Symptoms present for a year but getting worse. Her weight is stable. Takes Omeprazole for hx of GERD.  No odynophagia  Luree also complains of difuse achy abdominal pain over the last 6-8 months. She had the pain prior to that but Omeprazole controlled it. Now PPI just eases the pain. The pain is unrelated to eating. Feels like what she had in her 20's when diagnosed with a hialtal hernia except pain more diffuse. She has occasional nausea without vomiting. Wonders if pain related to constipation   Patient developed problems with constipation about a year ago. She can't attribute constipation to any medication changes. Drinks several glasses of water a day. Occasionally sees blood when constipated. Told she needed another colonocopy in 5 years. No [place] of colon cancer.   Labs today at [facility] Normal CBC, normal serum chemistries    Past Medical History:  Diagnosis Date  . Arthritis    R knee, hands   . Blood transfusion    1986- post-partial hysterectomy  . Breast cancer ([place])   . Cancer ([place])   . Chronic kidney disease   . Colon cancer ([place])   . Family history of breast cancer 10/24/2017  . GERD (gastroesophageal reflux disease)    recently taken off anti  reflux tx  . Heart murmur    functional murmur, echo in [place]- long time ago   . Heart murmur   . Hepatitis    Hep. A- 1978  . Hepatitis   . Hypertension   . Lupus ([place])   . No pertinent past medical history    will see cardiac for review this week, 08/24/2011  . Personal history of colon cancer 10/24/2017  . Personal history of radiation therapy 2009  . Renal disorder   . rt breast ca dx'd 12/2007   xrt comp;     Past Surgical  History:  Procedure Laterality Date  . ABDOMINAL HYSTERECTOMY     1986  . APPENDECTOMY     1978 & tubal ligation   . BREAST EXCISIONAL BIOPSY Left 1998  . BREAST SURGERY     R breast lumpectomy  . CHOLECYSTECTOMY     1978- open   . REPLACEMENT TOTAL KNEE Left   . TONSILLECTOMY     1954  . TOTAL KNEE ARTHROPLASTY  08/30/2011   Procedure: TOTAL KNEE ARTHROPLASTY;  Surgeon: Kerin Salen;  Location: [place];  Service: Orthopedics;  Laterality: Right;  Right Total Knee Arthroplasty  . TUBAL LIGATION     Family History  Problem Relation Age of Onset  . Heart attack Father   . Cancer Other   . Hypertension Other   . Breast cancer Mother 33  . Breast cancer Maternal Aunt 41  . Cancer Paternal Aunt 36       type unk  . Other Maternal Grandmother 102       brain tumor dx at 78- did not bx or do additional testing on tumor  . Cancer Paternal Grandmother 54       type unk  . Anesthesia problems Neg Hx   . Hypotension Neg Hx   . Malignant hyperthermia Neg Hx   . Pseudochol deficiency Neg Hx   . Colon cancer Neg Hx   . Esophageal cancer Neg Hx   . Stomach cancer Neg Hx   . Pancreatic cancer Neg Hx   . Liver disease Neg Hx    Social History   Tobacco Use  . Smoking status: Never Smoker  . Smokeless tobacco: Never Used  Substance Use Topics  . Alcohol use: No  . Drug use: No   Current Outpatient Medications  Medication Sig Dispense Refill  . cholecalciferol (VITAMIN D) 1000 units tablet Take 1,000 Units by mouth daily.    . cyclobenzaprine (FLEXERIL) 5 MG tablet Take 1 tablet (5 mg total) by mouth 3 (three) times daily as needed for muscle spasms. 10 tablet 0  . hydrochlorothiazide (HYDRODIURIL) 25 MG tablet Take 25 mg by mouth every morning.     . metoprolol succinate (TOPROL-XL) 25 MG 24 hr tablet Take 2 tablets (50 mg total) by mouth every morning. 60 tablet 1  . omeprazole (PRILOSEC) 40 MG capsule Take 1 capsule (40 mg total) by mouth daily. 30 capsule 5  . tamoxifen  (NOLVADEX) 20 MG tablet Take 1 tablet (20 mg total) by mouth daily. 90 tablet 4   No current facility-administered medications for this visit.    Allergies  Allergen Reactions  . Iodine Anaphylaxis  . Iohexol Anaphylaxis     Code: HIVES, Desc: PT IS ALLERGIC TO IODINATED CONTRAST; REACTION INCLUDES FACIAL SWELLING,HIVES,RESPIRATORY DISTRESS;PT HASN'T HAD SCAN W/PREMEDS.;REFUSED CONTRAST OF ANY KIND!  KR   . Latex Anaphylaxis  . Penicillins Anaphylaxis and Rash  . Tetracycline Anaphylaxis  .  Milk-Related Compounds     Lactose intolerant     Review of Systems: All systems reviewed and negative except where noted in HPI.     Physical Exam:    Wt Readings from Last 3 Encounters:  02/17/18 207 lb 6.4 oz (94.1 kg)  02/17/18 207 lb 8 oz (94.1 kg)  05/07/17 200 lb (90.7 kg)    BP 110/68   Pulse 72   Ht 5\' 6"  (1.676 m)   Wt 207 lb 6.4 oz (94.1 kg)   BMI 33.48 kg/m  Constitutional:  Pleasant female in no acute distress. Psychiatric: Normal mood and affect. Behavior is normal. EENT: Pupils normal.  Conjunctivae are normal. No scleral icterus. Neck supple.  Cardiovascular: Normal rate, regular rhythm. No edema Pulmonary/chest: Effort normal and breath sounds normal. No wheezing, rales or rhonchi. Abdominal: Soft, nondistended, nontender. Bowel sounds active throughout. There are no masses palpable. No hepatomegaly. Neurological: Alert and oriented to person place and time. Skin: Skin is warm and dry. No rashes noted.  Tye Savoy, NP  02/17/2018, 10:34 AM

## 2018-02-17 NOTE — Patient Instructions (Addendum)
If you are age 69 or older, your body mass index should be between 23-30. Your Body mass index is 33.48 kg/m. If this is out of the aforementioned range listed, please consider follow up with your Primary Care Provider.  If you are age 69 or younger, your body mass index should be between 19-25. Your Body mass index is 33.48 kg/m. If this is out of the aformentioned range listed, please consider follow up with your Primary Care Provider.   You have been scheduled for a Barium Esophogram at [facility] (1st floor of the hospital) on 03/02/18 at 9:30 am. Please arrive 15 minutes prior to your appointment for registration. Make certain not to have anything to eat or drink 3 hours prior to your test. If you need to reschedule for any reason, please contact radiology at [phone] to do so. __________________________________________________________________ A barium swallow is an examination that concentrates on views of the esophagus. This tends to be a double contrast exam (barium and two liquids which, when combined, create a gas to distend the wall of the oesophagus) or single contrast (non-ionic iodine based). The study is usually tailored to your symptoms so a good history is essential. Attention is paid during the study to the form, structure and configuration of the esophagus, looking for functional disorders (such as aspiration, dysphagia, achalasia, motility and reflux) EXAMINATION You may be asked to change into a gown, depending on the type of swallow being performed. A radiologist and radiographer will perform the procedure. The radiologist will advise you of the type of contrast selected for your procedure and direct you during the exam. You will be asked to stand, sit or lie in several different positions and to hold a small amount of fluid in your mouth before being asked to swallow while the imaging is performed .In some instances you may be asked to swallow barium coated  marshmallows to assess the motility of a solid food bolus. The exam can be recorded as a digital or video fluoroscopy procedure. POST PROCEDURE It will take 1-2 days for the barium to pass through your system. To facilitate this, it is important, unless otherwise directed, to increase your fluids for the next 24-48hrs and to resume your normal diet.  This test typically takes about 30 minutes to perform. _____________________________________________________________________________  Advised patient to eat small bites, chew well with liquids in between bites to avoid food impaction. Use Magnesium Citrate tonight as directed. Start Metamucil tomorrow.  Call in two weeks with an update on constipation.  Thank you for choosing me and [facility].   Tye Savoy, NP

## 2018-02-17 NOTE — Telephone Encounter (Signed)
Scheduled appt per 5/31 los - sent reminder letter in the mail . Lab and f/u in 1 year.

## 2018-02-20 ENCOUNTER — Encounter: Payer: Self-pay | Admitting: Nurse Practitioner

## 2018-02-20 NOTE — Progress Notes (Signed)
Reviewed and agree with initial management plan.  Lillion Elbert T. Alexi Geibel, MD FACG 

## 2018-02-27 ENCOUNTER — Telehealth: Payer: Self-pay | Admitting: Oncology

## 2018-02-27 NOTE — Telephone Encounter (Signed)
Patient requested an appointment with [place] in 6 months

## 2018-03-02 ENCOUNTER — Ambulatory Visit (HOSPITAL_COMMUNITY)
Admission: RE | Admit: 2018-03-02 | Discharge: 2018-03-02 | Disposition: A | Discharge status: Home / Self Care | Payer: Medicare HMO | Source: Ambulatory Visit | Attending: Nurse Practitioner | Admitting: Nurse Practitioner

## 2018-03-02 DIAGNOSIS — K219 Gastro-esophageal reflux disease without esophagitis: Secondary | ICD-10-CM | POA: Insufficient documentation

## 2018-03-02 DIAGNOSIS — R1310 Dysphagia, unspecified: Secondary | ICD-10-CM | POA: Diagnosis present

## 2018-03-02 DIAGNOSIS — K449 Diaphragmatic hernia without obstruction or gangrene: Secondary | ICD-10-CM | POA: Insufficient documentation

## 2018-03-09 ENCOUNTER — Telehealth: Payer: Self-pay | Admitting: Nurse Practitioner

## 2018-03-09 NOTE — Telephone Encounter (Signed)
Pt says she was returning [facility] [place] call from 2 to 3 days ago, she stated that we were going to check with Dr. Collene Mares if she was due for another colon. She saw Nevin Bloodgood on 02/17/18

## 2018-03-10 NOTE — Telephone Encounter (Signed)
Nevin Bloodgood had wanted the patient to come back in for follow up in a "few weeks."  Left message for her to call back about scheduling a follow up appointment, to give her the swallow test results and let her know I have not heard about the recommendation for repeat colonoscopy yet.

## 2018-03-17 NOTE — Telephone Encounter (Signed)
Records from Dr Lorie Apley office came today over the fax. They will be put in Paula's office for review.

## 2018-03-22 NOTE — Telephone Encounter (Signed)
No polyps or masses seen in the exam was complete with a great prep.  No family history of colon cancer.  I am not sure why Dr. Collene Mares wanted a 5-year recall colonoscopy but will check with Dr. Fuller Plan to see if he feels the same. Otherwise would say 10 year recall.

## 2018-03-24 NOTE — Telephone Encounter (Signed)
Left information on her voicemail. 

## 2018-03-27 ENCOUNTER — Telehealth: Payer: Self-pay

## 2018-03-27 NOTE — Telephone Encounter (Signed)
Recall placed Left message for patient to call back

## 2018-03-27 NOTE — Telephone Encounter (Signed)
-----   Message from Ladene Artist, MD sent at 03/26/2018  3:05 PM EDT ----- Colonoscopy report on my desk from Dr. Collene Mares from 12/2010 with an excellent prep and no polyps that recommend a 5 year recall. Not sure why a 5 year recall was previously recommended however from information available she is average risk screening so colonoscopy in 10 years, 12/2020.  Pt requested date of next colonoscopy so please contact her.

## 2018-03-28 NOTE — Telephone Encounter (Signed)
Left message for patient to call back  

## 2018-03-28 NOTE — Telephone Encounter (Signed)
Patient notified of the recommendations.  

## 2018-03-30 ENCOUNTER — Emergency Department (HOSPITAL_COMMUNITY): Payer: Medicare HMO

## 2018-03-30 ENCOUNTER — Encounter (HOSPITAL_COMMUNITY): Payer: Self-pay

## 2018-03-30 ENCOUNTER — Emergency Department (HOSPITAL_COMMUNITY)
Admission: EM | Admit: 2018-03-30 | Discharge: 2018-03-30 | Disposition: A | Discharge status: Home / Self Care | Payer: Medicare HMO | Attending: Emergency Medicine | Admitting: Emergency Medicine

## 2018-03-30 DIAGNOSIS — Y9389 Activity, other specified: Secondary | ICD-10-CM | POA: Diagnosis not present

## 2018-03-30 DIAGNOSIS — S62634A Displaced fracture of distal phalanx of right ring finger, initial encounter for closed fracture: Secondary | ICD-10-CM | POA: Diagnosis not present

## 2018-03-30 DIAGNOSIS — Y92410 Unspecified street and highway as the place of occurrence of the external cause: Secondary | ICD-10-CM | POA: Diagnosis not present

## 2018-03-30 DIAGNOSIS — Y999 Unspecified external cause status: Secondary | ICD-10-CM | POA: Diagnosis not present

## 2018-03-30 DIAGNOSIS — N189 Chronic kidney disease, unspecified: Secondary | ICD-10-CM | POA: Diagnosis not present

## 2018-03-30 DIAGNOSIS — Z79899 Other long term (current) drug therapy: Secondary | ICD-10-CM | POA: Insufficient documentation

## 2018-03-30 DIAGNOSIS — S4991XA Unspecified injury of right shoulder and upper arm, initial encounter: Secondary | ICD-10-CM | POA: Diagnosis present

## 2018-03-30 DIAGNOSIS — I129 Hypertensive chronic kidney disease with stage 1 through stage 4 chronic kidney disease, or unspecified chronic kidney disease: Secondary | ICD-10-CM | POA: Insufficient documentation

## 2018-03-30 DIAGNOSIS — S52021A Displaced fracture of olecranon process without intraarticular extension of right ulna, initial encounter for closed fracture: Secondary | ICD-10-CM | POA: Insufficient documentation

## 2018-03-30 DIAGNOSIS — S42401A Unspecified fracture of lower end of right humerus, initial encounter for closed fracture: Secondary | ICD-10-CM

## 2018-03-30 MED ORDER — OXYCODONE-ACETAMINOPHEN 5-325 MG PO TABS: 1.0000 | ORAL_TABLET | Freq: Four times a day (QID) | ORAL | 0 refills | Status: DC | PRN

## 2018-03-30 MED ORDER — MORPHINE SULFATE 15 MG PO TABS: 15.0000 mg | ORAL_TABLET | Freq: Four times a day (QID) | ORAL | 0 refills | Status: DC | PRN

## 2018-03-30 MED ORDER — OXYCODONE-ACETAMINOPHEN 5-325 MG PO TABS
1.0000 | ORAL_TABLET | Freq: Once | ORAL | Status: AC
  Administered 2018-03-30: 1 via ORAL
  Filled 2018-03-30: qty 1
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
## 2018-03-30 NOTE — Progress Notes (Signed)
Orthopedic Tech Progress Note Patient Details:  [name] 05-Mar-1951 [id number]  Ortho Devices Type of Ortho Device: Ace wrap, Arm sling, Sugartong splint, Finger splint Ortho Device/Splint Location: rue Ortho Device/Splint Interventions: Application   Post Interventions Patient Tolerated: Well Instructions Provided: Care of device   Hildred Priest 03/30/2018, 5:46 PM
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
## 2018-03-30 NOTE — ED Notes (Addendum)
Pt given ice pack for wrist; pt also requesting to speak with charge RN

## 2018-06-14 ENCOUNTER — Telehealth: Payer: Self-pay | Admitting: Oncology

## 2018-06-14 NOTE — Telephone Encounter (Signed)
No f/u missed today per 9/25 sch message - left appts as is.

## 2018-06-29 ENCOUNTER — Telehealth: Payer: Self-pay | Admitting: Cardiovascular Disease

## 2018-06-29 NOTE — Telephone Encounter (Signed)
Received records from [place] [facility] on 06/29/18, Appt 07/11/18 @ 3:20PM. [place]

## 2018-07-11 ENCOUNTER — Encounter: Payer: Self-pay | Admitting: Cardiovascular Disease

## 2018-07-11 ENCOUNTER — Ambulatory Visit: Payer: Medicare HMO | Admitting: Cardiovascular Disease

## 2018-07-11 VITALS — BP 118/72 | HR 71 | Ht 66.0 in | Wt 214.0 lb

## 2018-07-11 DIAGNOSIS — I10 Essential (primary) hypertension: Secondary | ICD-10-CM

## 2018-07-11 DIAGNOSIS — R002 Palpitations: Secondary | ICD-10-CM | POA: Diagnosis not present

## 2018-07-11 DIAGNOSIS — R0602 Shortness of breath: Secondary | ICD-10-CM

## 2018-07-11 NOTE — Patient Instructions (Signed)
Medication Instructions:  Your physician recommends that you continue on your current medications as directed. Please refer to the Current Medication list given to you today.  If you need a refill on your cardiac medications before your next appointment, please call your pharmacy.   Lab work: WILL REQUEST LABS FROM DR [employer]  Testing/Procedures: Your physician has requested that you have an echocardiogram. Echocardiography is a painless test that uses sound waves to create images of your heart. It provides your doctor with information about the size and shape of your heart and how well your heart's chambers and valves are working. This procedure takes approximately one hour. There are no restrictions for this procedure.  Your physician has recommended that you wear a holter monitor. Holter monitors are medical devices that record the heart's electrical activity. Doctors most often use these monitors to diagnose arrhythmias. Arrhythmias are problems with the speed or rhythm of the heartbeat. The monitor is a small, portable device. You can wear one while you do your normal daily activities. This is usually used to diagnose what is causing palpitations/syncope (passing out). [place] ABOVE WILL BE DONE AT [facility] AT [address]  Follow-Up: Your physician recommends that you schedule a follow-up appointment VE:[facility] AFTER ALL THE TESTS HAVE BEEN COMPLETED    Holter Monitoring A Holter monitor is a small device that is used to detect abnormal heart rhythms. It clips to your clothing and is connected by wires to flat, sticky disks (electrodes) that attach to your chest. It is worn continuously for 24-48 hours. Follow these instructions at home:  Wear your Holter monitor at all times, even while exercising and sleeping, for as long as directed by your health care provider.  Make sure that the Holter monitor is safely clipped to your clothing or close to  your body as recommended by your health care provider.  Do not get the monitor or wires wet.  Do not put body lotion or moisturizer on your chest.  Keep your skin clean.  Keep a diary of your daily activities, such as walking and doing chores. If you feel that your heartbeat is abnormal or that your heart is fluttering or skipping a beat: ? Record what you are doing when it happens. ? Record what time of day the symptoms occur.  Return your Holter monitor as directed by your health care provider.  Keep all follow-up visits as directed by your health care provider. This is important. Get help right away if:  You feel lightheaded or you faint.  You have trouble breathing.  You feel pain in your chest, upper arm, or jaw.  You feel sick to your stomach and your skin is pale, cool, or damp.  You heartbeat feels unusual or abnormal. This information is not intended to replace advice given to you by your health care provider. Make sure you discuss any questions you have with your health care provider. Document Released: 06/04/2004 Document Revised: 02/12/2016 Document Reviewed: 04/15/2014 Elsevier Interactive Patient Education  2018 [employer].   Echocardiogram An echocardiogram, or echocardiography, uses sound waves (ultrasound) to produce an image of your heart. The echocardiogram is simple, painless, obtained within a short period of time, and offers valuable information to your health care provider. The images from an echocardiogram can provide information such as:  Evidence of coronary artery disease (CAD).  Heart size.  Heart muscle function.  Heart valve function.  Aneurysm detection.  Evidence of a past heart attack.  Fluid buildup around the heart.  Heart muscle thickening.  Assess heart valve function.  Tell a health care provider about:  Any allergies you have.  All medicines you are taking, including vitamins, herbs, eye drops, creams, and  over-the-counter medicines.  Any problems you or family members have had with anesthetic medicines.  Any blood disorders you have.  Any surgeries you have had.  Any medical conditions you have.  Whether you are pregnant or may be pregnant. What happens before the procedure? No special preparation is needed. Eat and drink normally. What happens during the procedure?  In order to produce an image of your heart, gel will be applied to your chest and a wand-like tool (transducer) will be moved over your chest. The gel will help transmit the sound waves from the transducer. The sound waves will harmlessly bounce off your heart to allow the heart images to be captured in real-time motion. These images will then be recorded.  You may need an IV to receive a medicine that improves the quality of the pictures. What happens after the procedure? You may return to your normal schedule including diet, activities, and medicines, unless your health care provider tells you otherwise. This information is not intended to replace advice given to you by your health care provider. Make sure you discuss any questions you have with your health care provider. Document Released: 09/03/2000 Document Revised: 04/24/2016 Document Reviewed: 05/14/2013 Elsevier Interactive Patient Education  2017 [employer].

## 2018-07-11 NOTE — Progress Notes (Signed)
Cardiology Office Note   Date:  07/11/2018   ID:  [name], [name] April 22, 1951, MRN [id number]  PCP:  Suella Broad, FNP  Cardiologist:   Skeet Latch, MD   Chief Complaint  Patient presents with  . Palpitations    daily  . Shortness of Breath    randomly.  . Edema    in ankles when standing or sitting to long.   . Leg Pain    cramping in legs       History of Present Illness: [name] is a 67 y.o. female with SLE, hypertension, prior breast cancerwho is being seen today for the evaluation of shortness of breath and palpitations at the request of Suella Broad,*.  [name] has noted palpitations off and on for the last 2 months.  The episodes occur almost daily and frequently several times throughout the day.  Her heart feels as though it is beating irregularly and speeding up and slowing down.  She does not drink caffeine or use over-the-counter cold or cough medications regularly.  When she has the palpitations she gets lightheaded and short of breath but denies chest pain.  She denies syncope.  This episode sometimes awaken her from sleep but also occur frequently throughout the day.  They do not seem to be associated with exertion.  During this time she has also been feeling short of breath and fatigued.  The symptoms are ongoing both at rest and with exertion.  She has mild lower extremity edema to the ankles when she stands or sits for too long.  This is unchanged from her baseline and improves with elevation of her legs.  She has no orthopnea or PND.  She notes that the only thing that is different is that she started on BuSpar right before the symptoms started.  However she is taking this medication in the past and had no side effects.  [name] has a history of lupus.  She is experienced symptoms of rash, fever, and blurred vision.  Lately she has not needed any medications.  Her symptoms seem to be worse with changes in the season.   She is had breast cancer twice.  The first was in 1998 and improved with tamoxifen only.  The second was in 2009 in the opposite breast.  This was treated with radiation and tamoxifen.  She reports that her father had a heart attack at age 40 but that he was a heavy smoker.  She notes that she does not like to take medications.    Past Medical History:  Diagnosis Date  . Arthritis    R knee, hands   . Blood transfusion    1986- post-partial hysterectomy  . Breast cancer ([place])   . Cancer ([place])   . Chronic kidney disease   . Colon cancer ([place])   . Family history of breast cancer 10/24/2017  . GERD (gastroesophageal reflux disease)    recently taken off anti reflux tx  . Heart murmur    functional murmur, echo in [place]- long time ago   . Heart murmur   . Hepatitis    Hep. A- 1978  . Hepatitis   . Hypertension   . Lupus ([place])   . No pertinent past medical history    will see cardiac for review this week, 08/24/2011  . Personal history of colon cancer 10/24/2017  . Personal history of radiation therapy 2009  . Renal disorder   . rt breast ca  dx'd 12/2007   xrt comp;    Past Surgical History:  Procedure Laterality Date  . ABDOMINAL HYSTERECTOMY     1986  . APPENDECTOMY     1978 & tubal ligation   . BREAST EXCISIONAL BIOPSY Left 1998  . BREAST SURGERY     R breast lumpectomy  . CHOLECYSTECTOMY     1978- open   . REPLACEMENT TOTAL KNEE Left   . TONSILLECTOMY     1954  . TOTAL KNEE ARTHROPLASTY  08/30/2011   Procedure: TOTAL KNEE ARTHROPLASTY;  Surgeon: Kerin Salen;  Location: [facility];  Service: Orthopedics;  Laterality: Right;  Right Total Knee Arthroplasty  . TUBAL LIGATION       Current Outpatient Medications  Medication Sig Dispense Refill  . busPIRone (BUSPAR) 5 MG tablet Take 5 mg by mouth 3 (three) times daily.  3  . cholecalciferol (VITAMIN D) 1000 units tablet Take 1,000 Units by mouth daily.    . hydrochlorothiazide (HYDRODIURIL) 25 MG tablet Take 25 mg by mouth  every morning.     . metoprolol succinate (TOPROL-XL) 25 MG 24 hr tablet Take 2 tablets (50 mg total) by mouth every morning. 60 tablet 1  . omeprazole (PRILOSEC) 40 MG capsule Take 1 capsule (40 mg total) by mouth daily. 30 capsule 5  . tamoxifen (NOLVADEX) 20 MG tablet Take 1 tablet (20 mg total) by mouth daily. 90 tablet 4   No current facility-administered medications for this visit.     Allergies:   Iodine; Iohexol; Latex; Penicillins; Tetracycline; and Milk-related compounds    Social History:  The patient  reports that she has never smoked. She has never used smokeless tobacco. She reports that she does not drink alcohol or use drugs.   Family History:  The patient's family history includes Breast cancer (age of onset: 3) in her maternal aunt; Breast cancer (age of onset: 1) in her mother; Cancer in her other; Cancer (age of onset: 58) in her paternal aunt; Cancer (age of onset: 56) in her paternal grandmother; Heart attack in her father; Hypertension in her brother, mother, and other; Kidney disease in her brother; Lung cancer in her paternal grandmother; Other (age of onset: 74) in her maternal grandmother.    ROS:  Please see the history of present illness.   Otherwise, review of systems are positive for none.   All other systems are reviewed and negative.    PHYSICAL EXAM: VS:  BP 118/72   Pulse 71   Ht 5\' 6"  (1.676 m)   Wt 214 lb (97.1 kg)   BMI 34.54 kg/m  , BMI Body mass index is 34.54 kg/m. GENERAL:  Well appearing HEENT:  Pupils equal round and reactive, fundi not visualized, oral mucosa unremarkable NECK:  No jugular venous distention, waveform within normal limits, carotid upstroke brisk and symmetric, no bruits, no thyromegaly LUNGS:  Clear to auscultation bilaterally HEART:  RRR.  PMI not displaced or sustained,S1 and S2 within normal limits, no S3, no S4, no clicks, no rubs, no murmurs ABD:  Flat, positive bowel sounds normal in frequency in pitch, no bruits,  no rebound, no guarding, no midline pulsatile mass, no hepatomegaly, no splenomegaly EXT:  2 plus pulses throughout, no edema, no cyanosis no clubbing SKIN:  No rashes no nodules NEURO:  Cranial nerves II through XII grossly intact, motor grossly intact throughout PSYCH:  Cognitively intact, oriented to person place and time    EKG:  EKG is not ordered today.  Recent  Labs: 02/17/2018: ALT 15; BUN 16; Creatinine 0.81; Hemoglobin 12.4; Platelet Count 197; Potassium 3.9; Sodium 141    Lipid Panel    Component Value Date/Time   CHOL 143 11/24/2007 2222   TRIG 86 11/24/2007 2222   HDL 55 11/24/2007 2222   CHOLHDL 2.6 Ratio 11/24/2007 2222   VLDL 17 11/24/2007 2222   LDLCALC 71 11/24/2007 2222      Wt Readings from Last 3 Encounters:  07/11/18 214 lb (97.1 kg)  02/17/18 207 lb 6.4 oz (94.1 kg)  02/17/18 207 lb 8 oz (94.1 kg)      ASSESSMENT AND PLAN:  # Palpitations: Concerning for atrial fibrillation.  We will get a 48 hour Holter.  She reportedly had labs recently checked with her rheumatologist.  We will get a copy.  # Shortness of breath:  [name] appears to be euvolemic on exam.  She does have a history of lupus and was previously told that her heart was "swinging" on a prior echo.  We will get an echocardiogram to ensure that she does not have any pericardial effusion or tamponade.  # Hypertension: BP well-controlled on HCTZ and metoprolol.   Current medicines are reviewed at length with the patient today.  The patient does not have concerns regarding medicines.  The following changes have been made:  no change  Labs/ tests ordered today include:   Orders Placed This Encounter  Procedures  . Holter monitor - 48 hour  . ECHOCARDIOGRAM COMPLETE     Disposition:   FU with Lynniah Janoski C. Oval Linsey, MD, [facility] in 2 months.      Signed, Mali Eppard C. Oval Linsey, MD, [facility]  07/11/2018 4:03 PM    [facility]

## 2018-07-20 ENCOUNTER — Ambulatory Visit (HOSPITAL_COMMUNITY): Payer: Medicare HMO | Attending: Cardiology

## 2018-07-20 ENCOUNTER — Other Ambulatory Visit: Payer: Self-pay

## 2018-07-20 ENCOUNTER — Ambulatory Visit (INDEPENDENT_AMBULATORY_CARE_PROVIDER_SITE_OTHER): Payer: Medicare HMO

## 2018-07-20 DIAGNOSIS — R0602 Shortness of breath: Secondary | ICD-10-CM | POA: Insufficient documentation

## 2018-07-20 DIAGNOSIS — R002 Palpitations: Secondary | ICD-10-CM

## 2018-07-23 ENCOUNTER — Observation Stay (HOSPITAL_COMMUNITY)
Admission: EM | Admit: 2018-07-23 | Discharge: 2018-07-25 | Disposition: A | Discharge status: Home / Self Care | Payer: Medicare HMO | Attending: Internal Medicine | Admitting: Internal Medicine

## 2018-07-23 ENCOUNTER — Emergency Department (HOSPITAL_COMMUNITY): Payer: Medicare HMO

## 2018-07-23 ENCOUNTER — Other Ambulatory Visit: Payer: Self-pay

## 2018-07-23 ENCOUNTER — Encounter (HOSPITAL_COMMUNITY): Payer: Self-pay | Admitting: Emergency Medicine

## 2018-07-23 DIAGNOSIS — Z881 Allergy status to other antibiotic agents status: Secondary | ICD-10-CM | POA: Insufficient documentation

## 2018-07-23 DIAGNOSIS — Z8249 Family history of ischemic heart disease and other diseases of the circulatory system: Secondary | ICD-10-CM | POA: Insufficient documentation

## 2018-07-23 DIAGNOSIS — Z6834 Body mass index (BMI) 34.0-34.9, adult: Secondary | ICD-10-CM | POA: Insufficient documentation

## 2018-07-23 DIAGNOSIS — Z79810 Long term (current) use of selective estrogen receptor modulators (SERMs): Secondary | ICD-10-CM | POA: Diagnosis not present

## 2018-07-23 DIAGNOSIS — Z87892 Personal history of anaphylaxis: Secondary | ICD-10-CM | POA: Insufficient documentation

## 2018-07-23 DIAGNOSIS — E669 Obesity, unspecified: Secondary | ICD-10-CM | POA: Insufficient documentation

## 2018-07-23 DIAGNOSIS — Z90710 Acquired absence of both cervix and uterus: Secondary | ICD-10-CM | POA: Insufficient documentation

## 2018-07-23 DIAGNOSIS — Z85038 Personal history of other malignant neoplasm of large intestine: Secondary | ICD-10-CM | POA: Insufficient documentation

## 2018-07-23 DIAGNOSIS — K219 Gastro-esophageal reflux disease without esophagitis: Secondary | ICD-10-CM | POA: Diagnosis not present

## 2018-07-23 DIAGNOSIS — E873 Alkalosis: Secondary | ICD-10-CM | POA: Diagnosis not present

## 2018-07-23 DIAGNOSIS — N189 Chronic kidney disease, unspecified: Secondary | ICD-10-CM | POA: Insufficient documentation

## 2018-07-23 DIAGNOSIS — I10 Essential (primary) hypertension: Secondary | ICD-10-CM

## 2018-07-23 DIAGNOSIS — M1711 Unilateral primary osteoarthritis, right knee: Secondary | ICD-10-CM | POA: Insufficient documentation

## 2018-07-23 DIAGNOSIS — R002 Palpitations: Secondary | ICD-10-CM | POA: Diagnosis not present

## 2018-07-23 DIAGNOSIS — K449 Diaphragmatic hernia without obstruction or gangrene: Secondary | ICD-10-CM | POA: Diagnosis not present

## 2018-07-23 DIAGNOSIS — K2990 Gastroduodenitis, unspecified, without bleeding: Secondary | ICD-10-CM

## 2018-07-23 DIAGNOSIS — Z880 Allergy status to penicillin: Secondary | ICD-10-CM | POA: Insufficient documentation

## 2018-07-23 DIAGNOSIS — R1013 Epigastric pain: Secondary | ICD-10-CM | POA: Diagnosis not present

## 2018-07-23 DIAGNOSIS — F419 Anxiety disorder, unspecified: Secondary | ICD-10-CM | POA: Diagnosis not present

## 2018-07-23 DIAGNOSIS — R55 Syncope and collapse: Principal | ICD-10-CM | POA: Diagnosis present

## 2018-07-23 DIAGNOSIS — R0789 Other chest pain: Secondary | ICD-10-CM | POA: Diagnosis not present

## 2018-07-23 DIAGNOSIS — R079 Chest pain, unspecified: Secondary | ICD-10-CM | POA: Diagnosis not present

## 2018-07-23 DIAGNOSIS — K2970 Gastritis, unspecified, without bleeding: Secondary | ICD-10-CM

## 2018-07-23 DIAGNOSIS — Z853 Personal history of malignant neoplasm of breast: Secondary | ICD-10-CM | POA: Diagnosis not present

## 2018-07-23 DIAGNOSIS — M19041 Primary osteoarthritis, right hand: Secondary | ICD-10-CM | POA: Insufficient documentation

## 2018-07-23 DIAGNOSIS — M329 Systemic lupus erythematosus, unspecified: Secondary | ICD-10-CM | POA: Diagnosis not present

## 2018-07-23 DIAGNOSIS — I129 Hypertensive chronic kidney disease with stage 1 through stage 4 chronic kidney disease, or unspecified chronic kidney disease: Secondary | ICD-10-CM | POA: Insufficient documentation

## 2018-07-23 DIAGNOSIS — R010 Benign and innocent cardiac murmurs: Secondary | ICD-10-CM | POA: Insufficient documentation

## 2018-07-23 DIAGNOSIS — IMO0002 Reserved for concepts with insufficient information to code with codable children: Secondary | ICD-10-CM | POA: Diagnosis present

## 2018-07-23 DIAGNOSIS — Z96651 Presence of right artificial knee joint: Secondary | ICD-10-CM | POA: Diagnosis not present

## 2018-07-23 DIAGNOSIS — M19042 Primary osteoarthritis, left hand: Secondary | ICD-10-CM | POA: Insufficient documentation

## 2018-07-23 DIAGNOSIS — E876 Hypokalemia: Secondary | ICD-10-CM | POA: Diagnosis not present

## 2018-07-23 DIAGNOSIS — K2950 Unspecified chronic gastritis without bleeding: Secondary | ICD-10-CM | POA: Insufficient documentation

## 2018-07-23 DIAGNOSIS — R1084 Generalized abdominal pain: Secondary | ICD-10-CM

## 2018-07-23 DIAGNOSIS — Z91041 Radiographic dye allergy status: Secondary | ICD-10-CM | POA: Diagnosis not present

## 2018-07-23 DIAGNOSIS — Z923 Personal history of irradiation: Secondary | ICD-10-CM | POA: Insufficient documentation

## 2018-07-23 DIAGNOSIS — R109 Unspecified abdominal pain: Secondary | ICD-10-CM | POA: Diagnosis present

## 2018-07-23 DIAGNOSIS — Z79899 Other long term (current) drug therapy: Secondary | ICD-10-CM | POA: Diagnosis not present

## 2018-07-23 HISTORY — DX: Gastritis, unspecified, without bleeding: K29.70

## 2018-07-23 HISTORY — DX: Syncope and collapse: R55

## 2018-07-23 HISTORY — DX: Diaphragmatic hernia without obstruction or gangrene: K44.9

## 2018-07-23 LAB — CBC WITH DIFFERENTIAL/PLATELET
Abs Immature Granulocytes: 0.01 10*3/uL (ref 0.00–0.07)
Basophils Absolute: 0 10*3/uL (ref 0.0–0.1)
Basophils Relative: 0 %
Eosinophils Absolute: 0.1 10*3/uL (ref 0.0–0.5)
Eosinophils Relative: 2 %
HCT: 40.3 % (ref 36.0–46.0)
Hemoglobin: 12.3 g/dL (ref 12.0–15.0)
Immature Granulocytes: 0 %
Lymphocytes Relative: 41 %
Lymphs Abs: 2 10*3/uL (ref 0.7–4.0)
MCH: 30 pg (ref 26.0–34.0)
MCHC: 30.5 g/dL (ref 30.0–36.0)
MCV: 98.3 fL (ref 80.0–100.0)
Monocytes Absolute: 0.7 10*3/uL (ref 0.1–1.0)
Monocytes Relative: 14 %
Neutro Abs: 2 10*3/uL (ref 1.7–7.7)
Neutrophils Relative %: 43 %
Platelets: 175 10*3/uL (ref 150–400)
RBC: 4.1 MIL/uL (ref 3.87–5.11)
RDW: 13 % (ref 11.5–15.5)
WBC: 4.9 10*3/uL (ref 4.0–10.5)
nRBC: 0 % (ref 0.0–0.2)

## 2018-07-23 LAB — COMPREHENSIVE METABOLIC PANEL
ALT: 18 U/L (ref 0–44)
AST: 30 U/L (ref 15–41)
Albumin: 3.3 g/dL — ABNORMAL LOW (ref 3.5–5.0)
Alkaline Phosphatase: 68 U/L (ref 38–126)
Anion gap: 7 (ref 5–15)
BUN: 9 mg/dL (ref 8–23)
CO2: 24 mmol/L (ref 22–32)
Calcium: 9.2 mg/dL (ref 8.9–10.3)
Chloride: 108 mmol/L (ref 98–111)
Creatinine, Ser: 0.9 mg/dL (ref 0.44–1.00)
GFR calc Af Amer: >60 mL/min (ref >60)
GFR calc non Af Amer: >60 mL/min (ref >60)
Glucose, Bld: 101 mg/dL — ABNORMAL HIGH (ref 70–99)
Potassium: 3.3 mmol/L — ABNORMAL LOW (ref 3.5–5.1)
Sodium: 139 mmol/L (ref 135–145)
Total Bilirubin: 0.3 mg/dL (ref 0.3–1.2)
Total Protein: 6.6 g/dL (ref 6.5–8.1)

## 2018-07-23 LAB — D-DIMER, QUANTITATIVE: D-Dimer, Quant: 1.9 ug/mL-FEU — ABNORMAL HIGH (ref 0.00–0.50)

## 2018-07-23 LAB — TROPONIN I: Troponin I: <0.03 ng/mL (ref <0.03)

## 2018-07-23 LAB — I-STAT TROPONIN, ED
Troponin i, poc: 0 ng/mL (ref 0.00–0.08)
Troponin i, poc: 0 ng/mL (ref 0.00–0.08)

## 2018-07-23 LAB — LACTIC ACID, PLASMA: Lactic Acid, Venous: 0.9 mmol/L (ref 0.5–1.9)

## 2018-07-23 LAB — BRAIN NATRIURETIC PEPTIDE: B Natriuretic Peptide: 111 pg/mL — ABNORMAL HIGH (ref 0.0–100.0)

## 2018-07-23 LAB — LIPASE, BLOOD: Lipase: 33 U/L (ref 11–51)

## 2018-07-23 MED ORDER — ONDANSETRON HCL 4 MG PO TABS
4.0000 mg | ORAL_TABLET | Freq: Four times a day (QID) | ORAL | Status: DC | PRN
  Administered 2018-07-23: 4 mg via ORAL
  Filled 2018-07-23: qty 1

## 2018-07-23 MED ORDER — TECHNETIUM TO 99M ALBUMIN AGGREGATED
4.0700 | Freq: Once | INTRAVENOUS | Status: AC | PRN
  Administered 2018-07-23: 4.07 via INTRAVENOUS

## 2018-07-23 MED ORDER — ONDANSETRON HCL 4 MG/2ML IJ SOLN
4.0000 mg | Freq: Four times a day (QID) | INTRAMUSCULAR | Status: DC | PRN
  Administered 2018-07-24: 4 mg via INTRAVENOUS
  Filled 2018-07-23: qty 2

## 2018-07-23 MED ORDER — MORPHINE SULFATE (PF) 2 MG/ML IV SOLN
1.0000 mg | INTRAVENOUS | Status: DC | PRN
  Administered 2018-07-23 – 2018-07-24 (×2): 1 mg via INTRAVENOUS
  Filled 2018-07-23 (×2): qty 1

## 2018-07-23 MED ORDER — TECHNETIUM TC 99M DIETHYLENETRIAME-PENTAACETIC ACID
27.7000 | Freq: Once | INTRAVENOUS | Status: AC | PRN
  Administered 2018-07-23: 27.7 via RESPIRATORY_TRACT

## 2018-07-23 MED ORDER — METOPROLOL SUCCINATE ER 50 MG PO TB24
50.0000 mg | ORAL_TABLET | Freq: Every day | ORAL | Status: DC
  Administered 2018-07-23: 50 mg via ORAL
  Filled 2018-07-23 (×2): qty 1

## 2018-07-23 MED ORDER — BUSPIRONE HCL 10 MG PO TABS
10.0000 mg | ORAL_TABLET | Freq: Three times a day (TID) | ORAL | Status: DC
  Administered 2018-07-23 – 2018-07-25 (×5): 10 mg via ORAL
  Filled 2018-07-23 (×7): qty 1

## 2018-07-23 MED ORDER — ACETAMINOPHEN 650 MG RE SUPP: 650.0000 mg | Freq: Four times a day (QID) | RECTAL | Status: DC | PRN

## 2018-07-23 MED ORDER — POTASSIUM CHLORIDE IN NACL 40-0.9 MEQ/L-% IV SOLN
INTRAVENOUS | Status: AC
  Administered 2018-07-23 – 2018-07-24 (×2): 75 mL/h via INTRAVENOUS
  Filled 2018-07-23 (×3): qty 1000

## 2018-07-23 MED ORDER — ACETAMINOPHEN 325 MG PO TABS
650.0000 mg | ORAL_TABLET | Freq: Four times a day (QID) | ORAL | Status: DC | PRN
  Administered 2018-07-23: 650 mg via ORAL
  Filled 2018-07-23: qty 2

## 2018-07-23 MED ORDER — ASPIRIN 81 MG PO CHEW
324.0000 mg | CHEWABLE_TABLET | Freq: Once | ORAL | Status: AC
  Administered 2018-07-23: 324 mg via ORAL
  Filled 2018-07-23: qty 4

## 2018-07-23 MED ORDER — SODIUM CHLORIDE 0.9% FLUSH
3.0000 mL | Freq: Two times a day (BID) | INTRAVENOUS | Status: DC
  Administered 2018-07-24 – 2018-07-25 (×2): 3 mL via INTRAVENOUS

## 2018-07-23 MED ORDER — ALBUTEROL SULFATE (2.5 MG/3ML) 0.083% IN NEBU: 2.5000 mg | INHALATION_SOLUTION | RESPIRATORY_TRACT | Status: DC | PRN

## 2018-07-23 MED ORDER — PANTOPRAZOLE SODIUM 40 MG PO TBEC
40.0000 mg | DELAYED_RELEASE_TABLET | Freq: Every day | ORAL | Status: DC
  Administered 2018-07-23 – 2018-07-24 (×2): 40 mg via ORAL
  Filled 2018-07-23 (×2): qty 1

## 2018-07-23 MED ORDER — NITROGLYCERIN 0.4 MG SL SUBL: 0.4000 mg | SUBLINGUAL_TABLET | SUBLINGUAL | Status: DC | PRN

## 2018-07-23 MED ORDER — OXYCODONE HCL 5 MG PO TABS
5.0000 mg | ORAL_TABLET | ORAL | Status: DC | PRN
  Administered 2018-07-23 – 2018-07-24 (×2): 5 mg via ORAL
  Filled 2018-07-23 (×2): qty 1

## 2018-07-23 NOTE — ED Triage Notes (Addendum)
Pt arrives with EMS for eval of "LUQ abdominal pain, per EMS" Upon assessment in triage, pt pain is assessed to left chest and left rib. Pt reports intermittent shortness of breath and nausea. She has had this for 1 month. Pain is currently 8/10. Pt also reports intermittent dizziness. Pt states she had an echo at the doctor and wore a halter monitor that she took off yesterday.

## 2018-07-23 NOTE — ED Notes (Addendum)
Pt to CT then for VQ scan.

## 2018-07-23 NOTE — Consult Note (Signed)
Reason for Consult:abd pain Referring Physician: Ardelle Balls MAYARA PAULSON is an 67 y.o. female.  HPI: 27 yof who presents with acute onset of upper abdominal pain today at church. Felt like someone punched her. This was associated with sob, chest pain and syncopal episode. She has three episodes emesis.  She has been having bms and passing flatus. Nothing making it better.  She was then brought to er.  She has had gerd and the chest pain feels like that. No urinary symptoms.  She has recently been evaluated by cards to dyspnea and palpitations for the last month with nothing showing up yet. I was asked tosee her due to concern about abdominal exam    IN er she has neg troponins, ct without contrast negative, cxr negative, d dimer high with neg vq scan.   When I came to examine her in [employer] she was standing up moving her bed in the room.  She is here with her husband  Past Medical History:  Diagnosis Date  . Arthritis    R knee, hands   . Blood transfusion    1986- post-partial hysterectomy  . Breast cancer ([place])   . Cancer ([place])   . Chronic kidney disease   . Colon cancer ([place])   . Family history of breast cancer 10/24/2017  . GERD (gastroesophageal reflux disease)    recently taken off anti reflux tx  . Heart murmur    functional murmur, echo in [place]- long time ago   . Heart murmur   . Hepatitis    Hep. A- 1978  . Hepatitis   . Hypertension   . Lupus ([place])   . No pertinent past medical history    will see cardiac for review this week, 08/24/2011  . Personal history of colon cancer 10/24/2017  . Personal history of radiation therapy 2009  . Renal disorder   . rt breast ca dx'd 12/2007   xrt comp;    Past Surgical History:  Procedure Laterality Date  . ABDOMINAL HYSTERECTOMY     1986  . APPENDECTOMY     1978 & tubal ligation   . BREAST EXCISIONAL BIOPSY Left 1998  . BREAST SURGERY     R breast lumpectomy  . CHOLECYSTECTOMY     1978- open   . REPLACEMENT TOTAL KNEE Left   .  TONSILLECTOMY     1954  . TOTAL KNEE ARTHROPLASTY  08/30/2011   Procedure: TOTAL KNEE ARTHROPLASTY;  Surgeon: Kerin Salen;  Location: [facility];  Service: Orthopedics;  Laterality: Right;  Right Total Knee Arthroplasty  . TUBAL LIGATION      Family History  Problem Relation Age of Onset  . Heart attack Father   . Cancer Other   . Hypertension Other   . Breast cancer Mother 37  . Hypertension Mother   . Hypertension Brother   . Kidney disease Brother   . Breast cancer Maternal Aunt 66  . Cancer Paternal Aunt 39       type unk  . Other Maternal Grandmother 102       brain tumor dx at 29- did not bx or do additional testing on tumor  . Cancer Paternal Grandmother 2       type unk  . Lung cancer Paternal Grandmother   . Anesthesia problems Neg Hx   . Hypotension Neg Hx   . Malignant hyperthermia Neg Hx   . Pseudochol deficiency Neg Hx   . Colon cancer Neg Hx   .  Esophageal cancer Neg Hx   . Stomach cancer Neg Hx   . Pancreatic cancer Neg Hx   . Liver disease Neg Hx     Social History:  reports that she has never smoked. She has never used smokeless tobacco. She reports that she does not drink alcohol or use drugs.  Allergies:  Allergies  Allergen Reactions  . Iodine Anaphylaxis  . Iohexol Anaphylaxis     Code: HIVES, Desc: PT IS ALLERGIC TO IODINATED CONTRAST; REACTION INCLUDES FACIAL SWELLING,HIVES,RESPIRATORY DISTRESS;PT HASN'T HAD SCAN W/PREMEDS.;REFUSED CONTRAST OF ANY KIND!  KR   . Latex Anaphylaxis  . Penicillins Anaphylaxis and Rash    Has patient had a PCN reaction causing immediate rash, facial/tongue/throat swelling, SOB or lightheadedness with hypotension: Yes Has patient had a PCN reaction causing severe rash involving mucus membranes or skin necrosis: Yes Has patient had a PCN reaction that required hospitalization: No Has patient had a PCN reaction occurring within the last 10 years: No If all of the above answers are "NO", then may proceed with  Cephalosporin use.   . Tetracycline Anaphylaxis  . Milk-Related Compounds Other (See Comments)    Milk causes stomach pain/ all other dairy is ok    Medications: reviewed  Results for orders placed or performed during the hospital encounter of 07/23/18 (from the past 48 hour(s))  CBC with Differential     Status: None   Collection Time: 07/23/18 11:34 AM  Result Value Ref Range   WBC 4.9 4.0 - 10.5 K/uL   RBC 4.10 3.87 - 5.11 MIL/uL   Hemoglobin 12.3 12.0 - 15.0 g/dL   HCT 40.3 36.0 - 46.0 %   MCV 98.3 80.0 - 100.0 fL   MCH 30.0 26.0 - 34.0 pg   MCHC 30.5 30.0 - 36.0 g/dL   RDW 13.0 11.5 - 15.5 %   Platelets 175 150 - 400 K/uL   nRBC 0.0 0.0 - 0.2 %   Neutrophils Relative % 43 %   Neutro Abs 2.0 1.7 - 7.7 K/uL   Lymphocytes Relative 41 %   Lymphs Abs 2.0 0.7 - 4.0 K/uL   Monocytes Relative 14 %   Monocytes Absolute 0.7 0.1 - 1.0 K/uL   Eosinophils Relative 2 %   Eosinophils Absolute 0.1 0.0 - 0.5 K/uL   Basophils Relative 0 %   Basophils Absolute 0.0 0.0 - 0.1 K/uL   Immature Granulocytes 0 %   Abs Immature Granulocytes 0.01 0.00 - 0.07 K/uL    Comment: Performed at [facility], [address], [place], [place] [address]  Comprehensive metabolic panel     Status: Abnormal   Collection Time: 07/23/18 11:34 AM  Result Value Ref Range   Sodium 139 135 - 145 mmol/L   Potassium 3.3 (L) 3.5 - 5.1 mmol/L   Chloride 108 98 - 111 mmol/L   CO2 24 22 - 32 mmol/L   Glucose, Bld 101 (H) 70 - 99 mg/dL   BUN 9 8 - 23 mg/dL   Creatinine, Ser 0.90 0.44 - 1.00 mg/dL   Calcium 9.2 8.9 - 10.3 mg/dL   Total Protein 6.6 6.5 - 8.1 g/dL   Albumin 3.3 (L) 3.5 - 5.0 g/dL   AST 30 15 - 41 U/L   ALT 18 0 - 44 U/L   Alkaline Phosphatase 68 38 - 126 U/L   Total Bilirubin 0.3 0.3 - 1.2 mg/dL   GFR calc non Af Amer >60 >60 mL/min   GFR calc Af Amer >60 >  60 mL/min    Comment: (NOTE) The eGFR has been calculated using the CKD EPI equation. This calculation has not been validated in all  clinical situations. eGFR's persistently <60 mL/min signify possible Chronic Kidney Disease.    Anion gap 7 5 - 15    Comment: Performed at [facility] [address], [place], [place] [address]  Lipase, blood     Status: None   Collection Time: 07/23/18 11:34 AM  Result Value Ref Range   Lipase 33 11 - 51 U/L    Comment: Performed at [facility] [address] [place], [place] [address]  I-stat troponin, ED     Status: None   Collection Time: 07/23/18 11:39 AM  Result Value Ref Range   Troponin i, poc 0.00 0.00 - 0.08 ng/mL   Comment 3            Comment: Due to the release kinetics of cTnI, a negative result within the first hours of the onset of symptoms does not rule out myocardial infarction with certainty. If myocardial infarction is still suspected, repeat the test at appropriate intervals.   D-dimer, quantitative (not at [facility])     Status: Abnormal   Collection Time: 07/23/18  1:23 PM  Result Value Ref Range   D-Dimer, Quant 1.90 (H) 0.00 - 0.50 ug/mL-FEU    Comment: (NOTE) At the manufacturer cut-off of 0.50 ug/mL FEU, this assay has been documented to exclude PE with a sensitivity and negative predictive value of 97 to 99%.  At this time, this assay has not been approved by the FDA to exclude DVT/VTE. Results should be correlated with clinical presentation. Performed at [facility], [place] [address], [place], [place] [address]   Brain natriuretic peptide     Status: Abnormal   Collection Time: 07/23/18  1:23 PM  Result Value Ref Range   B Natriuretic Peptide 111.0 (H) 0.0 - 100.0 pg/mL    Comment: Performed at [facility] [address], [place], [place] [address]  I-Stat Troponin, ED (not at [facility])     Status: None   Collection Time: 07/23/18  4:26 PM  Result Value Ref Range   Troponin i, poc 0.00 0.00 - 0.08 ng/mL   Comment 3            Comment: Due to the release kinetics of cTnI, a negative result within the first hours of the  onset of symptoms does not rule out myocardial infarction with certainty. If myocardial infarction is still suspected, repeat the test at appropriate intervals.     Ct Abdomen Pelvis Wo Contrast  Result Date: 07/23/2018 CLINICAL DATA:  67 year old female with history chest pressure and abdominal pain EXAM: CT ABDOMEN AND PELVIS WITHOUT CONTRAST TECHNIQUE: Multidetector CT imaging of the abdomen and pelvis was performed following the standard protocol without IV contrast. COMPARISON:  12/25/2014 FINDINGS: Lower chest: No acute abnormality. Hepatobiliary: Unremarkable appearance of liver. Absent gallbladder. Pancreas: Unremarkable Spleen: Unremarkable Adrenals/Urinary Tract: Unremarkable appearance of the adrenal glands. No evidence of hydronephrosis of the right or left kidney. No nephrolithiasis. Unremarkable course of the bilateral ureters. Unremarkable appearance of the urinary bladder. Stomach/Bowel: Hiatal hernia. Unremarkable stomach. Small bowel unremarkable without abnormal distention. No transition. No focal wall thickening. Appendix is not visualized, however, no inflammatory changes are present adjacent to the cecum to indicate an appendicitis. Colonic diverticula are present. No focal inflammatory changes to suggest diverticulitis. Vascular/Lymphatic: Calcifications of the aorta. No lymphadenopathy. Reproductive:  Changes of hysterectomy. Other: Small fat containing umbilical hernia. Musculoskeletal: No acute displaced fracture. Degenerative changes of the lumbar spine. Degenerative changes of the bilateral hips. IMPRESSION: No acute CT finding of the abdomen. Hiatal hernia. Diverticular disease without evidence of acute diverticulitis. Electronically Signed   By: Corrie Mckusick D.O.   On: 07/23/2018 15:25   Dg Chest 2 View  Result Date: 07/23/2018 CLINICAL DATA:  Chest pain EXAM: CHEST - 2 VIEW COMPARISON:  09/27/2017 FINDINGS: There is biapical pleural thickening. There is no focal  parenchymal opacity. There is no pleural effusion or pneumothorax. The heart and mediastinal contours are unremarkable. The osseous structures are unremarkable. IMPRESSION: No active cardiopulmonary disease. Electronically Signed   By: Kathreen Devoid   On: 07/23/2018 12:38   Nm Pulmonary Vent And Perf (v/q Scan)  Result Date: 07/23/2018 CLINICAL DATA:  Left-sided chest pain EXAM: NUCLEAR MEDICINE VENTILATION - PERFUSION LUNG SCAN TECHNIQUE: Ventilation images were obtained in multiple projections using inhaled aerosol [id number]. Perfusion images were obtained in multiple projections after intravenous injection of Tc-942mAA. RADIOPHARMACEUTICALS:  27.7 mCi of Tc-9964mPA aerosol inhalation and 4.07 mCi Tc99m7m IV COMPARISON:  Chest x-ray from earlier in the same day. FINDINGS: Ventilation: No focal ventilation defect. Perfusion: No wedge shaped peripheral perfusion defects to suggest acute pulmonary embolism. IMPRESSION: No ventilation perfusion mismatch to suggest pulmonary embolism are noted. Electronically Signed   By: MarkInez Catalina.   On: 07/23/2018 16:33    Review of Systems  Respiratory: Positive for shortness of breath.   Cardiovascular: Positive for chest pain and palpitations.  Gastrointestinal: Positive for abdominal pain, nausea and vomiting.   Blood pressure (!) 154/96, pulse (!) 59, temperature 98.5 F (36.9 C), temperature source Oral, resp. rate 15, height 5' 6"  (1.676 m), weight 95.6 kg, SpO2 96 %. Physical Exam  Vitals reviewed. Constitutional: She is oriented to person, place, and time. She appears well-developed and well-nourished.  HENT:  Head: Normocephalic and atraumatic.  Right Ear: External ear normal.  Left Ear: External ear normal.  Eyes: Pupils are equal, round, and reactive to light. No scleral icterus.  Neck: Neck supple.  Cardiovascular: Normal rate.  Respiratory: Effort normal and breath sounds normal.  GI: Soft. There is tenderness (moderately tnder deep  palpation upper abdomen, no perritonitis) in the epigastric area.    Lymphadenopathy:    She has no cervical adenopathy.  Neurological: She is alert and oriented to person, place, and time.  Skin: Skin is warm and dry.    Assessment/Plan: Abdominal pain  Not sure of etiology right now  Scan without contrast is negative. dont think she has ischemia and dont think needs surgery for this at this time. Will follow with you.     MattRolm Bookbinder3/2019, 8:07 PM

## 2018-07-23 NOTE — ED Provider Notes (Signed)
[facility] EMERGENCY DEPARTMENT Provider Note   CSN: [id number] Arrival date & time: 07/23/18  1115     History   Chief Complaint Chief Complaint  Patient presents with  . Chest Pain    HPI [name] is a 67 y.o. female.  HPI Pt is a 67 y/o female with a h/o breast CA (1998 & 2009 currently on tamoxifen), colon CA (2003), lupus, HTN who presents to the ED today for evaluation of chest pain and a syncopal episode.  Pt states that she was standing in church when she became hot, sweaty, nauseated, vomited x2, SOB, and had chest pressure. Denies palpitations. She then attempted to walk to the restroom and had a syncopal episode. She is unsure if she fell and hit her head, states that she woke up on a couch in her church. Witnesses state that she did not hit her head.  She currently feels lightheaded/dizzy and feels like she is about to pass out. She denies any current chest pain, but does endorse chest pressure. Denies current SOB or palpitations. Has some nausea and some epigastric abd pain.  She denies fevers, chills, cough, diarrhea, constipation, urinary sxs. Has intermittent bilat ankle swelling. Denies leg pain, hemoptysis, recent surgery/trauma, recent long travel, hormone use, or hx of DVT/PE.   States she has had SOB for the last month that has been intermittent and is associated with palpitations (fast/slow HR). She was seen by her pcp for this and had EKG which showed twave inversions. She was sent to cardiology (Dr. Skeet Latch) and had echo on Thursday. Had holter monitor on until yesterday. She has not heard back about results of ECHO or Holter.   States she has been cancer free for 10 years. Denies HLD or DM. Denies tobacco use.    Past Medical History:  Diagnosis Date  . Arthritis    R knee, hands   . Blood transfusion    1986- post-partial hysterectomy  . Breast cancer ([place])   . Cancer ([place])   . Chronic kidney disease   . Colon  cancer ([place])   . Family history of breast cancer 10/24/2017  . GERD (gastroesophageal reflux disease)    recently taken off anti reflux tx  . Heart murmur    functional murmur, echo in [place]- long time ago   . Heart murmur   . Hepatitis    Hep. A- 1978  . Hepatitis   . Hypertension   . Lupus ([place])   . No pertinent past medical history    will see cardiac for review this week, 08/24/2011  . Personal history of colon cancer 10/24/2017  . Personal history of radiation therapy 2009  . Renal disorder   . rt breast ca dx'd 12/2007   xrt comp;    Patient Active Problem List   Diagnosis Date Noted  . Syncope and collapse 07/23/2018  . Genetic testing 11/24/2017  . Family history of breast cancer 10/24/2017  . Personal history of colon cancer 10/24/2017  . Chest pain 10/05/2014  . Hypokalemia 10/05/2014  . Palpitations 09/09/2013  . Osteoarthritis of right knee 08/29/2011  . OTITIS MEDIA, RIGHT 01/22/2010  . OBESITY 12/24/2009  . CERUMEN IMPACTION, RIGHT 12/03/2009  . NEUROPATHY 09/04/2009  . FATIGUE 09/04/2009  . COUGH 09/04/2009  . ARTHRALGIA 04/22/2009  . DYSURIA 04/22/2009  . Malignant neoplasm of upper-outer quadrant of right breast in female, estrogen receptor positive ([place]) 01/11/2008  . HIATAL HERNIA 12/14/2007  . DIVERTICULOSIS, COLON 12/14/2007  .  CONSTIPATION 12/14/2007  . MAMMOGRAM, ABNORMAL, RIGHT 12/14/2007  . GERD 11/24/2007  . RHEUMATIC FEVER 11/03/2007  . HYPERTENSION, BENIGN ESSENTIAL 11/03/2007  . LUPUS 11/03/2007  . HIP PAIN, RIGHT 11/03/2007  . ABDOMINAL PAIN 11/03/2007    Past Surgical History:  Procedure Laterality Date  . ABDOMINAL HYSTERECTOMY     1986  . APPENDECTOMY     1978 & tubal ligation   . BREAST EXCISIONAL BIOPSY Left 1998  . BREAST SURGERY     R breast lumpectomy  . CHOLECYSTECTOMY     1978- open   . REPLACEMENT TOTAL KNEE Left   . TONSILLECTOMY     1954  . TOTAL KNEE ARTHROPLASTY  08/30/2011   Procedure: TOTAL KNEE ARTHROPLASTY;   Surgeon: Kerin Salen;  Location: [facility];  Service: Orthopedics;  Laterality: Right;  Right Total Knee Arthroplasty  . TUBAL LIGATION       OB History    Gravida  0   Para  0   Term  0   Preterm  0   AB  0   Living        SAB  0   TAB  0   Ectopic  0   Multiple      Live Births               Home Medications    Prior to Admission medications   Medication Sig Start Date End Date Taking? Authorizing Provider  busPIRone (BUSPAR) 5 MG tablet Take 5 mg by mouth 3 (three) times daily. 06/12/18  Yes [provider]  escitalopram (LEXAPRO) 5 MG tablet Take 5 mg by mouth daily.   Yes [provider]  FLUoxetine (PROZAC) 10 MG capsule Take 3 capsules by mouth daily.    Yes [provider]  hydrochlorothiazide (HYDRODIURIL) 25 MG tablet Take 25 mg by mouth every morning.    Yes [provider]  metoprolol succinate (TOPROL-XL) 25 MG 24 hr tablet Take 2 tablets (50 mg total) by mouth every morning. 10/03/17  Yes Magrinat, Virgie Dad, MD  omeprazole (PRILOSEC) 40 MG capsule Take 1 capsule (40 mg total) by mouth daily. 02/17/18  Yes Magrinat, Virgie Dad, MD  tamoxifen (NOLVADEX) 20 MG tablet Take 1 tablet (20 mg total) by mouth daily. 02/17/18  Yes Magrinat, Virgie Dad, MD  cholecalciferol (VITAMIN D) 1000 units tablet Take 1,000 Units by mouth daily.    [provider]    Family History Family History  Problem Relation Age of Onset  . Heart attack Father   . Cancer Other   . Hypertension Other   . Breast cancer Mother 75  . Hypertension Mother   . Hypertension Brother   . Kidney disease Brother   . Breast cancer Maternal Aunt 43  . Cancer Paternal Aunt 61       type unk  . Other Maternal Grandmother 102       brain tumor dx at 47- did not bx or do additional testing on tumor  . Cancer Paternal Grandmother 3       type unk  . Lung cancer Paternal Grandmother   . Anesthesia problems Neg Hx   . Hypotension Neg Hx   . Malignant  hyperthermia Neg Hx   . Pseudochol deficiency Neg Hx   . Colon cancer Neg Hx   . Esophageal cancer Neg Hx   . Stomach cancer Neg Hx   . Pancreatic cancer Neg Hx   . Liver disease Neg Hx  Social History Social History   Tobacco Use  . Smoking status: Never Smoker  . Smokeless tobacco: Never Used  Substance Use Topics  . Alcohol use: No  . Drug use: No     Allergies   Iodine; Iohexol; Latex; Penicillins; Tetracycline; Lactose intolerance (gi); and Milk-related compounds   Review of Systems Review of Systems  Constitutional: Negative for chills and fever.  HENT: Positive for rhinorrhea. Negative for congestion.   Eyes: Negative for visual disturbance.  Respiratory: Positive for shortness of breath. Negative for cough.   Cardiovascular: Positive for chest pain and palpitations. Negative for leg swelling.  Gastrointestinal: Positive for abdominal pain, nausea and vomiting. Negative for constipation and diarrhea.  Genitourinary: Negative for dysuria, flank pain, hematuria and urgency.  Musculoskeletal: Negative for back pain.  Skin: Negative for rash.  Neurological: Positive for dizziness, syncope and light-headedness. Negative for weakness, numbness and headaches.     Physical Exam Updated Vital Signs BP (!) 147/84   Pulse 63   Temp 98.5 F (36.9 C)   Resp 17   Ht 5\' 6"  (1.676 m)   Wt 95.3 kg   SpO2 100%   BMI 33.89 kg/m   Physical Exam  Constitutional: She appears well-developed and well-nourished. No distress.  HENT:  Head: Normocephalic and atraumatic.  Eyes: Pupils are equal, round, and reactive to light. Conjunctivae and EOM are normal.  Neck: Neck supple.  Cardiovascular: Normal rate and regular rhythm.  No murmur heard. Pulses:      Radial pulses are 2+ on the right side, and 2+ on the left side.       Dorsalis pedis pulses are 2+ on the right side, and 2+ on the left side.  Pulmonary/Chest: Effort normal and breath sounds normal. No respiratory  distress. She has no decreased breath sounds. She has no wheezes. She has no rhonchi. She has no rales.  Abdominal: Soft. Bowel sounds are normal. She exhibits no distension. There is tenderness (epigastric and LUE). There is no guarding.  Musculoskeletal: She exhibits no edema.       Right lower leg: Normal. She exhibits no tenderness and no edema.       Left lower leg: Normal. She exhibits no tenderness and no edema.  Neurological: She is alert. No cranial nerve deficit.  Mental Status:  Alert, thought content appropriate, able to give a coherent history. Speech fluent without evidence of aphasia. Able to follow 2 step commands without difficulty.  Motor:  Normal tone. 5/5 strength of BUE and BLE major muscle groups including strong and equal grip strength and dorsiflexion/plantar flexion Sensory: light touch normal in all extremities. Cerebellar: normal finger-to-nose with bilateral upper extremities  Skin: Skin is warm and dry.  Psychiatric: She has a normal mood and affect.  Nursing note and vitals reviewed.    ED Treatments / Results  Labs (all labs ordered are listed, but only abnormal results are displayed) Labs Reviewed  COMPREHENSIVE METABOLIC PANEL - Abnormal; Notable for the following components:      Result Value   Potassium 3.3 (*)    Glucose, Bld 101 (*)    Albumin 3.3 (*)    All other components within normal limits  D-DIMER, QUANTITATIVE (NOT AT [facility]) - Abnormal; Notable for the following components:   D-Dimer, Quant 1.90 (*)    All other components within normal limits  BRAIN NATRIURETIC PEPTIDE - Abnormal; Notable for the following components:   B Natriuretic Peptide 111.0 (*)    All other components within  normal limits  CBC WITH DIFFERENTIAL/PLATELET  LIPASE, BLOOD  I-STAT TROPONIN, ED  I-STAT TROPONIN, ED    EKG EKG Interpretation  Date/Time:  Sunday July 23 2018 11:23:39 EST Ventricular Rate:  60 PR Interval:  174 QRS Duration: 80 QT  Interval:  450 QTC Calculation: 450 R Axis:   16 Text Interpretation:  Normal sinus rhythm Minimal voltage criteria for LVH, may be normal variant Nonspecific T wave abnormality Abnormal ECG rate is slower compared to Jan 2019 repolarization abnormality similar to jan 2019 Confirmed by Sherwood Gambler [phone]) on 07/23/2018 11:58:52 AM Also confirmed by Sherwood Gambler [id number]), editor Lynder Parents [phone])  on 07/23/2018 2:12:35 PM   Radiology Ct Abdomen Pelvis Wo Contrast  Result Date: 07/23/2018 CLINICAL DATA:  67 year old female with history chest pressure and abdominal pain EXAM: CT ABDOMEN AND PELVIS WITHOUT CONTRAST TECHNIQUE: Multidetector CT imaging of the abdomen and pelvis was performed following the standard protocol without IV contrast. COMPARISON:  12/25/2014 FINDINGS: Lower chest: No acute abnormality. Hepatobiliary: Unremarkable appearance of liver. Absent gallbladder. Pancreas: Unremarkable Spleen: Unremarkable Adrenals/Urinary Tract: Unremarkable appearance of the adrenal glands. No evidence of hydronephrosis of the right or left kidney. No nephrolithiasis. Unremarkable course of the bilateral ureters. Unremarkable appearance of the urinary bladder. Stomach/Bowel: Hiatal hernia. Unremarkable stomach. Small bowel unremarkable without abnormal distention. No transition. No focal wall thickening. Appendix is not visualized, however, no inflammatory changes are present adjacent to the cecum to indicate an appendicitis. Colonic diverticula are present. No focal inflammatory changes to suggest diverticulitis. Vascular/Lymphatic: Calcifications of the aorta. No lymphadenopathy. Reproductive: Changes of hysterectomy. Other: Small fat containing umbilical hernia. Musculoskeletal: No acute displaced fracture. Degenerative changes of the lumbar spine. Degenerative changes of the bilateral hips. IMPRESSION: No acute CT finding of the abdomen. Hiatal hernia. Diverticular disease without evidence of  acute diverticulitis. Electronically Signed   By: Corrie Mckusick D.O.   On: 07/23/2018 15:25   Dg Chest 2 View  Result Date: 07/23/2018 CLINICAL DATA:  Chest pain EXAM: CHEST - 2 VIEW COMPARISON:  09/27/2017 FINDINGS: There is biapical pleural thickening. There is no focal parenchymal opacity. There is no pleural effusion or pneumothorax. The heart and mediastinal contours are unremarkable. The osseous structures are unremarkable. IMPRESSION: No active cardiopulmonary disease. Electronically Signed   By: Kathreen Devoid   On: 07/23/2018 12:38   Nm Pulmonary Vent And Perf (v/q Scan)  Result Date: 07/23/2018 CLINICAL DATA:  Left-sided chest pain EXAM: NUCLEAR MEDICINE VENTILATION - PERFUSION LUNG SCAN TECHNIQUE: Ventilation images were obtained in multiple projections using inhaled aerosol Tc-65m DTPA. Perfusion images were obtained in multiple projections after intravenous injection of Tc-82m-MAA. RADIOPHARMACEUTICALS:  27.7 mCi of Tc-14m DTPA aerosol inhalation and 4.07 mCi Tc69m-MAA IV COMPARISON:  Chest x-ray from earlier in the same day. FINDINGS: Ventilation: No focal ventilation defect. Perfusion: No wedge shaped peripheral perfusion defects to suggest acute pulmonary embolism. IMPRESSION: No ventilation perfusion mismatch to suggest pulmonary embolism are noted. Electronically Signed   By: Inez Catalina M.D.   On: 07/23/2018 16:33    Procedures Procedures (including critical care time)  Medications Ordered in ED Medications  aspirin chewable tablet 324 mg (324 mg Oral Given 07/23/18 1632)  technetium TC 85M diethylenetriame-pentaacetic acid (DTPA) injection 32.6 millicurie (71.2 millicuries Inhalation Given 07/23/18 1516)  technetium albumin aggregated (MAA) injection solution 4.58 millicurie (0.99 millicuries Intravenous Contrast Given 07/23/18 1545)     Initial Impression / Assessment and Plan / ED Course  I have reviewed the triage vital signs and the  nursing notes.  Pertinent labs &  imaging results that were available during my care of the patient were reviewed by me and considered in my medical decision making (see chart for details).     Final Clinical Impressions(s) / ED Diagnoses   Final diagnoses:  Syncope, unspecified syncope type   Pt with syncopal episode PTA that was associated with CP, SOB, NV, and diaphoresis.  Has recently been having intermittent SOB and palpitations and is currently being worked up by cardiology with recent ECHO and Holter monitor. Results not back on holter.  CBC within normal limits.  CMP with mild alkalemia, otherwise reassuring.  Lipase is negative.  Initial troponin is negative.  BNP slightly elevated at 111, no peripheral edema and no shortness of breath/abnormal lung sounds.  No edema or other abnormalities on chest x-ray. ECG NSR. LVH. Nonspecific twave abnormality that is similar from prior. No STEMI. D-dimer elevated, will proceed with VQ scan as patient has allergy to contrast.  Will also obtain CT abdomen and pelvis given her abdominal pain nausea vomiting today. CT abd negative for acute abnormality.    Feel the patient will require admission for syncope work-up given high suspicion of cardiac etiology causing her syncopal episode.    Dr. Algis Liming accepts pt for admission.   ED Discharge Orders    None       Rodney Booze, [place] 07/23/18 1816    Sherwood Gambler, MD 07/28/18 [phone]

## 2018-07-23 NOTE — ED Notes (Signed)
Pt back from CT

## 2018-07-23 NOTE — H&P (Addendum)
History and Physical    [name] NAT:[id number] DOB: Aug 08, 1951 DOA: 07/23/2018  PCP: Suella Broad, FNP   I have briefly reviewed patients previous medical reports in [facility].  Patient coming from: Home  Chief Complaint: Chest pain, abdominal pain, nausea and vomiting and passed out at church this morning.  HPI: [name] is a 66 year old pleasant married female, independent and active, volunteers at [employer], [place] of lupus, HTN, anxiety, GERD, breast cancer status post bilateral lumpectomy and on tamoxifen, palpitations being evaluated by Cardiology, hysterectomy, appendectomy and open cholecystectomy presented to [facility] ED on 11/3 with above complaints.  Patient interviewed and examined along with spouse at bedside.  She was in her usual state of health this morning when she had usual breakfast and went to church at about 9 AM.  At around 10:15 AM, she started experiencing subacute onset of diffuse abdominal pain, felt like somebody had hit her in the stomach, rated as 10/10 in severity, nonradiating.  Along with this she also felt a pressure-like lower midsternal chest discomfort which also worsened to 10/10 in severity.  The pains were associated with sweating, dyspnea, palpitations.  She felt nauseous and with assistance of a colleague went to the bathroom.  When she went to the toilet to throw up, she passed out.  The next thing she remembers is waking up on the couch and multiple people attending to her.  She feels that she may have passed out for about 5 minutes.  She does not know if she hit her head.  She denies headache.  She continued to have above chest and abdominal discomfort without aggravating or relieving factors.  If the abdominal pain is not similar to her GERD symptoms.  She denies dysuria, constipation.  She denies recent long distance travel.  She was recently by cardiology on 10/22 in the office for dyspnea and palpitations.  TTE looks  unremarkable.  She just got of the Holter monitor yesterday and it has not been reported yet.  She then presented to the ED via EMS.    ED Course: Mildly hypertensive but other vital signs stable.  CBC normal.  CMP significant for potassium of 3.3.  EKG without acute findings.  POC troponins x2 Negative.  CT abdomen was performed without contrast (anaphylaxis to contrast) which showed no acute findings in the abdomen, hiatal hernia, diverticular disease without evidence of acute diverticulitis.  Chest x-ray without acute cardiopulmonary disease.  D-dimer was elevated and followed up with VQ scan which shows no PE.  Review of Systems:  All other systems reviewed and apart from HPI, are negative.  Past Medical History:  Diagnosis Date  . Arthritis    R knee, hands   . Blood transfusion    1986- post-partial hysterectomy  . Breast cancer ([place])   . Cancer ([place])   . Chronic kidney disease   . Colon cancer ([place])   . Family history of breast cancer 10/24/2017  . GERD (gastroesophageal reflux disease)    recently taken off anti reflux tx  . Heart murmur    functional murmur, echo in [place]- long time ago   . Heart murmur   . Hepatitis    Hep. A- 1978  . Hepatitis   . Hypertension   . Lupus ([place])   . No pertinent past medical history    will see cardiac for review this week, 08/24/2011  . Personal history of colon cancer 10/24/2017  . Personal history of radiation  therapy 2009  . Renal disorder   . rt breast ca dx'd 12/2007   xrt comp;    Past Surgical History:  Procedure Laterality Date  . ABDOMINAL HYSTERECTOMY     1986  . APPENDECTOMY     1978 & tubal ligation   . BREAST EXCISIONAL BIOPSY Left 1998  . BREAST SURGERY     R breast lumpectomy  . CHOLECYSTECTOMY     1978- open   . REPLACEMENT TOTAL KNEE Left   . TONSILLECTOMY     1954  . TOTAL KNEE ARTHROPLASTY  08/30/2011   Procedure: TOTAL KNEE ARTHROPLASTY;  Surgeon: Kerin Salen;  Location: [facility];  Service: Orthopedics;   Laterality: Right;  Right Total Knee Arthroplasty  . TUBAL LIGATION      Social History  reports that she has never smoked. She has never used smokeless tobacco. She reports that she does not drink alcohol or use drugs.  Allergies  Allergen Reactions  . Iodine Anaphylaxis  . Iohexol Anaphylaxis     Code: HIVES, Desc: PT IS ALLERGIC TO IODINATED CONTRAST; REACTION INCLUDES FACIAL SWELLING,HIVES,RESPIRATORY DISTRESS;PT HASN'T HAD SCAN W/PREMEDS.;REFUSED CONTRAST OF ANY KIND!  KR   . Latex Anaphylaxis  . Penicillins Anaphylaxis and Rash    Has patient had a PCN reaction causing immediate rash, facial/tongue/throat swelling, SOB or lightheadedness with hypotension: Yes Has patient had a PCN reaction causing severe rash involving mucus membranes or skin necrosis: Yes Has patient had a PCN reaction that required hospitalization: No Has patient had a PCN reaction occurring within the last 10 years: No If all of the above answers are "NO", then may proceed with Cephalosporin use.   . Tetracycline Anaphylaxis  . Milk-Related Compounds Other (See Comments)    Milk causes stomach pain/ all other dairy is ok    Family History  Problem Relation Age of Onset  . Heart attack Father   . Cancer Other   . Hypertension Other   . Breast cancer Mother 53  . Hypertension Mother   . Hypertension Brother   . Kidney disease Brother   . Breast cancer Maternal Aunt 38  . Cancer Paternal Aunt 69       type unk  . Other Maternal Grandmother 102       brain tumor dx at 104- did not bx or do additional testing on tumor  . Cancer Paternal Grandmother 60       type unk  . Lung cancer Paternal Grandmother   . Anesthesia problems Neg Hx   . Hypotension Neg Hx   . Malignant hyperthermia Neg Hx   . Pseudochol deficiency Neg Hx   . Colon cancer Neg Hx   . Esophageal cancer Neg Hx   . Stomach cancer Neg Hx   . Pancreatic cancer Neg Hx   . Liver disease Neg Hx      Prior to Admission medications     Medication Sig Start Date End Date Taking? Authorizing Provider  busPIRone (BUSPAR) 5 MG tablet Take 10 mg by mouth 3 (three) times daily.  06/12/18  Yes [provider]  cholecalciferol (VITAMIN D) 1000 units tablet Take 1,000 Units by mouth daily.   Yes [provider]  hydrochlorothiazide (HYDRODIURIL) 25 MG tablet Take 25 mg by mouth daily.    Yes [provider]  metoprolol succinate (TOPROL-XL) 25 MG 24 hr tablet Take 2 tablets (50 mg total) by mouth every morning. Patient taking differently: Take 50 mg by mouth daily.  10/03/17  Yes Magrinat, Virgie Dad, MD  omeprazole (PRILOSEC) 40 MG capsule Take 1 capsule (40 mg total) by mouth daily. 02/17/18  Yes Magrinat, Virgie Dad, MD  tamoxifen (NOLVADEX) 20 MG tablet Take 1 tablet (20 mg total) by mouth daily. 02/17/18  Yes Magrinat, Virgie Dad, MD    Physical Exam: Vitals:   07/23/18 1730 07/23/18 1805 07/23/18 1815 07/23/18 1830  BP: (!) 162/91 (!) 147/84 (!) 140/102 124/63  Pulse: 62 63 (!) 58 64  Resp: 17   16  Temp:      SpO2: 99% 100% 100% 99%  Weight:      Height:          Constitutional: Pleasant middle-aged female, moderately built and nourished lying propped up in bed with some painful distress but no respiratory distress. Eyes: PERTLA, lids and conjunctivae normal ENMT: Mucous membranes are slightly dry. Posterior pharynx clear of any exudate or lesions. Normal dentition.  Neck: supple, no masses, no thyromegaly Respiratory: clear to auscultation bilaterally, no wheezing, no crackles. Normal respiratory effort. No accessory muscle use.  Cardiovascular: S1 & S2 heard, regular rate and rhythm, no murmurs / rubs / gallops. No extremity edema. 2+ pedal pulses.  Symmetric pulses.  No carotid bruits.  Telemetry shows normal sinus rhythm. Abdomen: Abdomen is nondistended.  Right upper quadrant scar of open cholecystectomy.  Surgical scar around the umbilicus.  Diffuse tenderness and guarding without rigidity or  rebound.  No organomegaly or masses appreciated.  Bowel sounds present. Musculoskeletal: no clubbing / cyanosis. No joint deformity upper and lower extremities. Good ROM, no contractures. Normal muscle tone.  Skin: no rashes, lesions, ulcers. No induration Neurologic: CN 2-12 grossly intact. Sensation intact, DTR normal. Strength 5/5 in all 4 limbs.  Psychiatric: Normal judgment and insight. Alert and oriented x 3. Normal mood.     Labs on Admission: I have personally reviewed following labs and imaging studies  CBC: Recent Labs  Lab 07/23/18 1134  WBC 4.9  NEUTROABS 2.0  HGB 12.3  HCT 40.3  MCV 98.3  PLT 008   Basic Metabolic Panel: Recent Labs  Lab 07/23/18 1134  NA 139  K 3.3*  CL 108  CO2 24  GLUCOSE 101*  BUN 9  CREATININE 0.90  CALCIUM 9.2   Liver Function Tests: Recent Labs  Lab 07/23/18 1134  AST 30  ALT 18  ALKPHOS 68  BILITOT 0.3  PROT 6.6  ALBUMIN 3.3*      Radiological Exams on Admission: Ct Abdomen Pelvis Wo Contrast  Result Date: 07/23/2018 CLINICAL DATA:  67 year old female with history chest pressure and abdominal pain EXAM: CT ABDOMEN AND PELVIS WITHOUT CONTRAST TECHNIQUE: Multidetector CT imaging of the abdomen and pelvis was performed following the standard protocol without IV contrast. COMPARISON:  12/25/2014 FINDINGS: Lower chest: No acute abnormality. Hepatobiliary: Unremarkable appearance of liver. Absent gallbladder. Pancreas: Unremarkable Spleen: Unremarkable Adrenals/Urinary Tract: Unremarkable appearance of the adrenal glands. No evidence of hydronephrosis of the right or left kidney. No nephrolithiasis. Unremarkable course of the bilateral ureters. Unremarkable appearance of the urinary bladder. Stomach/Bowel: Hiatal hernia. Unremarkable stomach. Small bowel unremarkable without abnormal distention. No transition. No focal wall thickening. Appendix is not visualized, however, no inflammatory changes are present adjacent to the cecum to  indicate an appendicitis. Colonic diverticula are present. No focal inflammatory changes to suggest diverticulitis. Vascular/Lymphatic: Calcifications of the aorta. No lymphadenopathy. Reproductive: Changes of hysterectomy. Other: Small fat containing umbilical hernia. Musculoskeletal: No acute displaced fracture. Degenerative changes of the lumbar spine. Degenerative changes  of the bilateral hips. IMPRESSION: No acute CT finding of the abdomen. Hiatal hernia. Diverticular disease without evidence of acute diverticulitis. Electronically Signed   By: Corrie Mckusick D.O.   On: 07/23/2018 15:25   Dg Chest 2 View  Result Date: 07/23/2018 CLINICAL DATA:  Chest pain EXAM: CHEST - 2 VIEW COMPARISON:  09/27/2017 FINDINGS: There is biapical pleural thickening. There is no focal parenchymal opacity. There is no pleural effusion or pneumothorax. The heart and mediastinal contours are unremarkable. The osseous structures are unremarkable. IMPRESSION: No active cardiopulmonary disease. Electronically Signed   By: Kathreen Devoid   On: 07/23/2018 12:38   Nm Pulmonary Vent And Perf (v/q Scan)  Result Date: 07/23/2018 CLINICAL DATA:  Left-sided chest pain EXAM: NUCLEAR MEDICINE VENTILATION - PERFUSION LUNG SCAN TECHNIQUE: Ventilation images were obtained in multiple projections using inhaled aerosol Tc-87m DTPA. Perfusion images were obtained in multiple projections after intravenous injection of Tc-58m-MAA. RADIOPHARMACEUTICALS:  27.7 mCi of Tc-36m DTPA aerosol inhalation and 4.07 mCi Tc84m-MAA IV COMPARISON:  Chest x-ray from earlier in the same day. FINDINGS: Ventilation: No focal ventilation defect. Perfusion: No wedge shaped peripheral perfusion defects to suggest acute pulmonary embolism. IMPRESSION: No ventilation perfusion mismatch to suggest pulmonary embolism are noted. Electronically Signed   By: Inez Catalina M.D.   On: 07/23/2018 16:33    EKG: Independently reviewed.  Sinus rhythm at 61 bpm, normal axis and no  acute changes.  QTc 475 ms.  Assessment/Plan Principal Problem:   Syncope and collapse Active Problems:   HYPERTENSION, BENIGN ESSENTIAL   GERD   LUPUS   Abdominal pain   Palpitations   Chest pain   Hypokalemia     1. Syncope and collapse: Unclear etiology.  Seems vasovagal in nature.  However given history of palpitations, arrhythmias is possible.  EKG and POC troponins x2: Unremarkable.  Observe on telemetry, check orthostatic blood pressures, consider cardiology consultation in a.m. to review findings of recent Holter monitor and for further recommendations.  Patient reports that she had history of fast and slow heart rate.  TTE 10/31: LVEF 55-60% and grade 1 diastolic dysfunction.  Driving restrictions to be advised prior to discharge. 2. Abdominal pain: Unclear etiology.  Exam however extremely concerning.  CT abdomen and pelvis however was without contrast and does not have acute findings.  Lipase is normal.  Check lactate. NPO, IV fluids.  Serial abdominal exam.  I discussed with general surgery and requested formal consultation. 3. Chest pain: Unclear etiology.  EKG, troponins negative.  D-dimer elevated.  No CTA chest done due to history of anaphylaxis to contrast.  VQ scan negative for PE.  Cycle troponins, continue PPI.  Consider cardiology consultation in a.m.  Current sublingual nitroglycerin. 4. Essential hypertension: Uncontrolled earlier, likely due to pain.  Resume home beta-blockers.  If not controlled, consider PRN IV hydralazine. 5. Hypokalemia: Replacing IV fluids and follow BMP. 6. Lupus: No current flare.  Her flares are usually associated with rash and myalgias.  Patient follows with outpatient rheumatology. 7. GERD: Continue PPI. 8. Palpitations: Monitor on telemetry.  Cardiology consultation to review recent Holter test results. 9. Anxiety: Not on medications at home.   DVT prophylaxis: SCDs Code Status: Full, confirmed in the presence of her spouse at  bedside. Family Communication: Discussed in detail with patient's spouse at bedside, updated care and answered questions. Disposition Plan: DC home pending clinical improvement. Consults called: General surgery Admission status: Observation, telemetry  Severity of Illness: The appropriate patient status for this patient  is OBSERVATION. Observation status is judged to be reasonable and necessary in order to provide the required intensity of service to ensure the patient's safety. The patient's presenting symptoms, physical exam findings, and initial radiographic and laboratory data in the context of their medical condition is felt to place them at decreased risk for further clinical deterioration. Furthermore, it is anticipated that the patient will be medically stable for discharge from the hospital within 2 midnights of admission. The following factors support the patient status of observation.   " The patient's presenting symptoms include passed out at church, chest pain, abdominal pain, nausea, vomiting and diaphoresis.. " The physical exam findings include diffuse abdominal pain with guarding. " The initial radiographic and laboratory data are CT abdomen and pelvis without contrast, VQ scan negative.  Potassium 3.3.      Vernell Leep MD [facility] Pager [phone]  If 7PM-7AM, please contact night-coverage [email or web address] Password [email or web address]  07/23/2018, 7:01 PM

## 2018-07-24 ENCOUNTER — Encounter (HOSPITAL_COMMUNITY): Payer: Self-pay | Admitting: Internal Medicine

## 2018-07-24 DIAGNOSIS — R55 Syncope and collapse: Principal | ICD-10-CM

## 2018-07-24 DIAGNOSIS — R1084 Generalized abdominal pain: Secondary | ICD-10-CM | POA: Diagnosis not present

## 2018-07-24 DIAGNOSIS — K449 Diaphragmatic hernia without obstruction or gangrene: Secondary | ICD-10-CM | POA: Diagnosis not present

## 2018-07-24 DIAGNOSIS — K2950 Unspecified chronic gastritis without bleeding: Secondary | ICD-10-CM | POA: Diagnosis not present

## 2018-07-24 DIAGNOSIS — R1013 Epigastric pain: Secondary | ICD-10-CM | POA: Diagnosis not present

## 2018-07-24 LAB — COMPREHENSIVE METABOLIC PANEL
ALT: 26 U/L (ref 0–44)
AST: 43 U/L — ABNORMAL HIGH (ref 15–41)
Albumin: 3 g/dL — ABNORMAL LOW (ref 3.5–5.0)
Alkaline Phosphatase: 63 U/L (ref 38–126)
Anion gap: 6 (ref 5–15)
BUN: 8 mg/dL (ref 8–23)
CO2: 25 mmol/L (ref 22–32)
Calcium: 8.9 mg/dL (ref 8.9–10.3)
Chloride: 109 mmol/L (ref 98–111)
Creatinine, Ser: 0.82 mg/dL (ref 0.44–1.00)
GFR calc Af Amer: >60 mL/min (ref >60)
GFR calc non Af Amer: >60 mL/min (ref >60)
Glucose, Bld: 86 mg/dL (ref 70–99)
Potassium: 4.1 mmol/L (ref 3.5–5.1)
Sodium: 140 mmol/L (ref 135–145)
Total Bilirubin: 0.7 mg/dL (ref 0.3–1.2)
Total Protein: 6.2 g/dL — ABNORMAL LOW (ref 6.5–8.1)

## 2018-07-24 LAB — LACTIC ACID, PLASMA: Lactic Acid, Venous: 1.5 mmol/L (ref 0.5–1.9)

## 2018-07-24 LAB — CBC
HCT: 34.8 % — ABNORMAL LOW (ref 36.0–46.0)
Hemoglobin: 11.1 g/dL — ABNORMAL LOW (ref 12.0–15.0)
MCH: 29.9 pg (ref 26.0–34.0)
MCHC: 31.9 g/dL (ref 30.0–36.0)
MCV: 93.8 fL (ref 80.0–100.0)
Platelets: 175 10*3/uL (ref 150–400)
RBC: 3.71 MIL/uL — ABNORMAL LOW (ref 3.87–5.11)
RDW: 13.2 % (ref 11.5–15.5)
WBC: 3.8 10*3/uL — ABNORMAL LOW (ref 4.0–10.5)
nRBC: 0 % (ref 0.0–0.2)

## 2018-07-24 LAB — HIV ANTIBODY (ROUTINE TESTING W REFLEX): HIV Screen 4th Generation wRfx: NONREACTIVE

## 2018-07-24 LAB — TROPONIN I
Troponin I: <0.03 ng/mL (ref <0.03)
Troponin I: <0.03 ng/mL (ref <0.03)

## 2018-07-24 MED ORDER — TAMOXIFEN CITRATE 10 MG PO TABS
20.0000 mg | ORAL_TABLET | Freq: Every day | ORAL | Status: DC
  Administered 2018-07-24 – 2018-07-25 (×2): 20 mg via ORAL
  Filled 2018-07-24 (×2): qty 2

## 2018-07-24 NOTE — H&P (View-Only) (Signed)
[facility] Consult: 10:36 AM 07/24/2018  LOS: 0 days    Referring Provider: Dr Hulen Skains  Primary Care Physician:  Suella Broad, FNP Primary Gastroenterologist:  Dr. Fuller Plan.  Previously Dr Collene Mares.      Reason for Consultation: Epigastric and upper abdominal pain.   HPI: [name] is a 67 y.o. female.  PMH Breast cancer: left in 1998, improved with tamoxifen only;  On right 2009 treated with lumpectomy, ongoing Tamoxifen.  Lupus.  Htn.  GERD.  Obesity.  S/p appy, chole, abd hysto, left TKR.   Pt reports colon cancer in a polyp in 2003, did not require surgery/chemo/radiation.  She is pretty sure it was cancer and not just dysplasia. EGD ~ 1970s for suspicion of ulcer: no ulcer per pt.   12/2010 Colonoscopy for FOBT+, constipation.  To TI, good prep.  Scattered tics, O/w normal.     First [facility] GI OV in 01/2018 re constipation, mixed dysphagia, diffuse abd pain.   03/02/18 esophagram: unremarkable dbl contrast study, tiny sliding HH. Takes Omeprazole 40 mg daily.  This has helped her sxs and having less abdominal pain, eating well, no dysphagia.  Her appetite and weight are stable.  Seen in consultation by cardiology late last week for about 8 weeks of palpitations and shortness of breath occurring at rest.  A 48-hour cardiac monitor finished up on Saturday, 07/22/2018 but has not yet been read.  Yesterday morning, about 30 minutes after eating a sausage biscuit from [employer], while she was insurance, she had onset of intense pain in her upper abdomen and epigastrium.  Felt like she had been punched.  There was nausea and she vomited 3 times, socially digested breakfast, no coffee grounds or blood.  Pain was so severe that she passed out.  BM was brown earlier that AM.   No recent NSAIDs. Says the pain is  not like pain she has had in past.       CT ab/pelvis, no contrast: HH, Diverticulosis in colon.   VQ scan normal.   LFTs, renal fx, Troponisns normal.   Hgb 12.3 >> 11.1.  Baseline 12.4.  MCV 93.   D dimer 1.9 (0 - 0.5 is normal)    Past Medical History:  Diagnosis Date  . Arthritis    R knee, hands   . Blood transfusion    1986- post-partial hysterectomy  . Breast cancer [facility]) 2009   right lumpectomy and radiation  . Chronic kidney disease   . Family history of breast cancer 10/24/2017  . GERD (gastroesophageal reflux disease)    recently taken off anti reflux tx  . Heart murmur    functional murmur, echo in [place]- long time ago   . Hepatitis    Hep. A- 1978  . Hypertension   . Lupus ([place])   . No pertinent past medical history    will see cardiac for review this week, 08/24/2011  . Personal history of radiation therapy 2009  . Renal disorder   . Syncope and collapse     Past Surgical History:  Procedure Laterality Date  . ABDOMINAL HYSTERECTOMY     1986  . APPENDECTOMY     1978 & tubal ligation   . BREAST EXCISIONAL BIOPSY Left 1998  . BREAST SURGERY     R breast lumpectomy  . CHOLECYSTECTOMY     1978- open   . REPLACEMENT TOTAL KNEE Left   . TONSILLECTOMY     1954  . TOTAL KNEE ARTHROPLASTY  08/30/2011   Procedure: TOTAL KNEE ARTHROPLASTY;  Surgeon: Kerin Salen;  Location: [facility];  Service: Orthopedics;  Laterality: Right;  Right Total Knee Arthroplasty  . TUBAL LIGATION      Prior to Admission medications   Medication Sig Start Date End Date Taking? Authorizing Provider  busPIRone (BUSPAR) 5 MG tablet Take 10 mg by mouth 3 (three) times daily.  06/12/18  Yes [provider]  cholecalciferol (VITAMIN D) 1000 units tablet Take 1,000 Units by mouth daily.   Yes [provider]  hydrochlorothiazide (HYDRODIURIL) 25 MG tablet Take 25 mg by mouth daily.    Yes [provider]  metoprolol succinate (TOPROL-XL) 25 MG 24 hr tablet Take 2  tablets (50 mg total) by mouth every morning. Patient taking differently: Take 50 mg by mouth daily.  10/03/17  Yes Magrinat, Virgie Dad, MD  omeprazole (PRILOSEC) 40 MG capsule Take 1 capsule (40 mg total) by mouth daily. 02/17/18  Yes Magrinat, Virgie Dad, MD  tamoxifen (NOLVADEX) 20 MG tablet Take 1 tablet (20 mg total) by mouth daily. 02/17/18  Yes Magrinat, Virgie Dad, MD    Scheduled Meds: . busPIRone  10 mg Oral TID  . metoprolol succinate  50 mg Oral Daily  . pantoprazole  40 mg Oral Daily  . sodium chloride flush  3 mL Intravenous Q12H  . tamoxifen  20 mg Oral Daily   Infusions: . 0.9 % NaCl with KCl 40 mEq / L 75 mL/hr (07/23/18 2106)   PRN Meds: acetaminophen **OR** acetaminophen, albuterol, morphine injection, nitroGLYCERIN, ondansetron **OR** ondansetron (ZOFRAN) IV, oxyCODONE   Allergies as of 07/23/2018 - Review Complete 07/23/2018  Allergen Reaction Noted  . Iodine Anaphylaxis 11/03/2007  . Iohexol Anaphylaxis 11/30/2007  . Latex Anaphylaxis 11/03/2007  . Penicillins Anaphylaxis and Rash 11/03/2007  . Tetracycline Anaphylaxis 11/03/2007  . Milk-related compounds Other (See Comments) 11/03/2007    Family History  Problem Relation Age of Onset  . Heart attack Father   . Cancer Other   . Hypertension Other   . Breast cancer Mother 74  . Hypertension Mother   . Hypertension Brother   . Kidney disease Brother   . Breast cancer Maternal Aunt 54  . Cancer Paternal Aunt 97       type unk  . Other Maternal Grandmother 102       brain tumor dx at 30- did not bx or do additional testing on tumor  . Cancer Paternal Grandmother 35       type unk  . Lung cancer Paternal Grandmother   . Anesthesia problems Neg Hx   . Hypotension Neg Hx   . Malignant hyperthermia Neg Hx   . Pseudochol deficiency Neg Hx   . Colon cancer Neg Hx   . Esophageal cancer Neg Hx   . Stomach cancer Neg Hx   . Pancreatic cancer Neg Hx   . Liver disease Neg Hx     Social History    Socioeconomic History  . Marital status: Married    Spouse name: Not on file  . Number  of children: Not on file  . Years of education: Not on file  . Highest education level: Not on file  Occupational History  . Not on file  Social Needs  . Financial resource strain: Not on file  . Food insecurity:    Worry: Not on file    Inability: Not on file  . Transportation needs:    Medical: Not on file    Non-medical: Not on file  Tobacco Use  . Smoking status: Never Smoker  . Smokeless tobacco: Never Used  Substance and Sexual Activity  . Alcohol use: No  . Drug use: No  . Sexual activity: Yes  Lifestyle  . Physical activity:    Days per week: Not on file    Minutes per session: Not on file  . Stress: Not on file  Relationships  . Social connections:    Talks on phone: Not on file    Gets together: Not on file    Attends religious service: Not on file    Active member of club or organization: Not on file    Attends meetings of clubs or organizations: Not on file    Relationship status: Not on file  . Intimate partner violence:    Fear of current or ex partner: Not on file    Emotionally abused: Not on file    Physically abused: Not on file    Forced sexual activity: Not on file  Other Topics Concern  . Not on file  Social History Narrative   ** Merged History Encounter **        REVIEW OF SYSTEMS: Constitutional:  no fatigue ENT:  No nose bleeds Pulm: Currently not short of breath but recently seen for dyspnea by the cardiologist. CV: Daily palpitations, dependent LE edema is been up on her feet for a while..  GU:  No hematuria, no frequency GI:  Per HPI Heme: Denies unusual bleeding or bruising. Transfusions: None Neuro: Syncope yesterday, has not recurred. Derm:  No itching, no rash or sores.  Has not had a flare of her lupus in a while, when she does she gets fevers, achiness all over, malaise. Endocrine:  No sweats or chills.  No polyuria or  dysuria Immunization: Not queried. Travel:  None beyond local counties in last few months.    PHYSICAL EXAM: Vital signs in last 24 hours: Vitals:   07/23/18 2301 07/24/18 0607  BP: 107/63 114/63  Pulse: (!) 58 (!) 54  Resp: 15 16  Temp: (!) 97.5 F (36.4 C) (!) 97.4 F (36.3 C)  SpO2: 98% 97%   Wt Readings from Last 3 Encounters:  07/24/18 96.8 kg  07/11/18 97.1 kg  02/17/18 94.1 kg    General: Patient looks well. Head: No facial asymmetry or swelling.  No signs of head trauma. Eyes: Scleral icterus.  No conjunctival pallor.  EOMI. Ears: Not hard of hearing. Nose: No discharge or congestion. Mouth: Upper dentures in place.  Oral mucosa is pink, moist, clear.  Remaining teeth in fair condition.  Tongue midline. Neck: No JVD, no masses, no thyromegaly. Lungs: Clear bilaterally.  No dyspnea, no cough. Heart: RRR.  No MRG.  S1, S2 present. Abdomen: Soft.  Diffusely tender to light touch.  Bowel sounds normal quality but hypoactive.  No HSM, masses, bruits, hernias. Rectal: Not performed. Musc/Skeltl: No joint redness, swelling or significant deformity. Extremities: No CCE. Neurologic: Oriented x3.  Alert.  No gross deficits or weakness.  No tremors. Skin: No rashes, no sores,  no suspicious lesions. Nodes: No cervical adenopathy. Psych: Cooperative, calm, pleasant.  Fluid speech  Intake/Output from previous day: No intake/output data recorded. Intake/Output this shift: Total I/O In: 750 [I.V.:750] Out: -   LAB RESULTS: Recent Labs    07/23/18 1134 07/24/18 0851  WBC 4.9 3.8*  HGB 12.3 11.1*  HCT 40.3 34.8*  PLT 175 175   BMET Lab Results  Component Value Date   NA 140 07/24/2018   NA 139 07/23/2018   NA 141 02/17/2018   K 4.1 07/24/2018   K 3.3 (L) 07/23/2018   K 3.9 02/17/2018   CL 109 07/24/2018   CL 108 07/23/2018   CL 107 02/17/2018   CO2 25 07/24/2018   CO2 24 07/23/2018   CO2 26 02/17/2018   GLUCOSE 86 07/24/2018   GLUCOSE 101 (H) 07/23/2018    GLUCOSE 91 02/17/2018   BUN 8 07/24/2018   BUN 9 07/23/2018   BUN 16 02/17/2018   CREATININE 0.82 07/24/2018   CREATININE 0.90 07/23/2018   CREATININE 0.81 02/17/2018   CALCIUM 8.9 07/24/2018   CALCIUM 9.2 07/23/2018   CALCIUM 9.3 02/17/2018   LFT Recent Labs    07/23/18 1134 07/24/18 0851  PROT 6.6 6.2*  ALBUMIN 3.3* 3.0*  AST 30 43*  ALT 18 26  ALKPHOS 68 63  BILITOT 0.3 0.7   PT/INR Lab Results  Component Value Date   INR 1.09 05/30/2015   INR 1.10 10/05/2014   INR 2.32 (H) 09/02/2011   Hepatitis Panel No results for input(s): HEPBSAG, HCVAB, HEPAIGM, HEPBIGM in the last 72 hours. C-Diff No components found for: CDIFF Lipase     Component Value Date/Time   LIPASE 33 07/23/2018 1134    Drugs of Abuse     Component Value Date/Time   LABOPIA POSITIVE (A) 12/04/2007 1149   COCAINSCRNUR NONE DETECTED 12/04/2007 1149   LABBENZ NONE DETECTED 12/04/2007 1149   AMPHETMU NONE DETECTED 12/04/2007 1149   THCU NONE DETECTED 12/04/2007 1149   LABBARB  12/04/2007 1149    NONE DETECTED        DRUG SCREEN FOR MEDICAL PURPOSES ONLY.  IF CONFIRMATION IS NEEDED FOR ANY PURPOSE, NOTIFY LAB WITHIN 5 DAYS.     RADIOLOGY STUDIES: Ct Abdomen Pelvis Wo Contrast  Result Date: 07/23/2018 CLINICAL DATA:  67 year old female with history chest pressure and abdominal pain EXAM: CT ABDOMEN AND PELVIS WITHOUT CONTRAST TECHNIQUE: Multidetector CT imaging of the abdomen and pelvis was performed following the standard protocol without IV contrast. COMPARISON:  12/25/2014 FINDINGS: Lower chest: No acute abnormality. Hepatobiliary: Unremarkable appearance of liver. Absent gallbladder. Pancreas: Unremarkable Spleen: Unremarkable Adrenals/Urinary Tract: Unremarkable appearance of the adrenal glands. No evidence of hydronephrosis of the right or left kidney. No nephrolithiasis. Unremarkable course of the bilateral ureters. Unremarkable appearance of the urinary bladder. Stomach/Bowel: Hiatal  hernia. Unremarkable stomach. Small bowel unremarkable without abnormal distention. No transition. No focal wall thickening. Appendix is not visualized, however, no inflammatory changes are present adjacent to the cecum to indicate an appendicitis. Colonic diverticula are present. No focal inflammatory changes to suggest diverticulitis. Vascular/Lymphatic: Calcifications of the aorta. No lymphadenopathy. Reproductive: Changes of hysterectomy. Other: Small fat containing umbilical hernia. Musculoskeletal: No acute displaced fracture. Degenerative changes of the lumbar spine. Degenerative changes of the bilateral hips. IMPRESSION: No acute CT finding of the abdomen. Hiatal hernia. Diverticular disease without evidence of acute diverticulitis. Electronically Signed   By: Corrie Mckusick D.O.   On: 07/23/2018 15:25   Dg Chest 2 View  Result Date: 07/23/2018 CLINICAL DATA:  Chest pain EXAM: CHEST - 2 VIEW COMPARISON:  09/27/2017 FINDINGS: There is biapical pleural thickening. There is no focal parenchymal opacity. There is no pleural effusion or pneumothorax. The heart and mediastinal contours are unremarkable. The osseous structures are unremarkable. IMPRESSION: No active cardiopulmonary disease. Electronically Signed   By: Kathreen Devoid   On: 07/23/2018 12:38   Nm Pulmonary Vent And Perf (v/q Scan)  Result Date: 07/23/2018 CLINICAL DATA:  Left-sided chest pain EXAM: NUCLEAR MEDICINE VENTILATION - PERFUSION LUNG SCAN TECHNIQUE: Ventilation images were obtained in multiple projections using inhaled aerosol Tc-30m DTPA. Perfusion images were obtained in multiple projections after intravenous injection of Tc-64m-MAA. RADIOPHARMACEUTICALS:  27.7 mCi of Tc-34m DTPA aerosol inhalation and 4.07 mCi Tc72m-MAA IV COMPARISON:  Chest x-ray from earlier in the same day. FINDINGS: Ventilation: No focal ventilation defect. Perfusion: No wedge shaped peripheral perfusion defects to suggest acute pulmonary embolism. IMPRESSION: No  ventilation perfusion mismatch to suggest pulmonary embolism are noted. Electronically Signed   By: Inez Catalina M.D.   On: 07/23/2018 16:33      IMPRESSION:   *     Acute upper abdominal/epigastric pain. Laboratory studies unrevealing.  CT scan showing small hiatal hernia, chronic diverticulosis.  *    Hx colon cancer, 2003.  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 Had colonoscopy in 12/2010, scattered diverticulosis noted.  *  2 months of palpitations and dyspnea spells.  48 hour cardiac monitoring finished on Sat 11/2, has not yet been read.      PLAN:     *  EGD set for 9 AM tomorrow.  Allow full liquids now, NPO after MN Continue oral Protonix.    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     Azucena Freed  07/24/2018, 10:36 AM Phone [phone]

## 2018-07-24 NOTE — Consult Note (Addendum)
Cardiology Consultation:   Patient ID: KLA BILY MRN: [id number]; DOB: October 10, 1950  Admit date: 07/23/2018 Date of Consult: 07/24/2018  Primary Care Provider: Suella Broad, FNP Primary Cardiologist: Skeet Latch, MD Primary Electrophysiologist:  None    Patient Profile:   [name] is a 67 y.o. female with a hx of SLE, HTN, breast cancer and palpitations,  who is being seen today for the evaluation of syncope at the request of Dr. Evangeline Gula.  History of Present Illness:   [name] with above hx has been seen in 2014 by our office for palpitations A nuclear stress test performed in December of 2012 did not show any perfusion abnormalities in her left ventricular ejection fraction was 67%.  Again seen 07/11/18, Palpitations have been occurring for last 2 months, occurring almost daily. And symptmatic with lightheadedness and SOB.  She was placed on buspar prior to events beginning.   + FH of MI with her father at age 59.  42 hr holter was ordered and echo.  BP was controlled in the office.   Echo 07/20/18 with EF 55-60%, mild concentric hypertrophy.  No RWMA,   There was an increased relative contribution of atrial contraction to ventricular filling.  G1DD, mild AR, trivial MR, trivial TR. Do not have results of holter as of yet- just turned in today.[name]   Pt presented by EMS yesterday with LUQ abd pain.  This was also Lt chest and Lt rib pain.  She was at church and had abd pain then lower Lt chest pain and was nauseated, diaphoretic, and SOB.  She went to [facility] and when she went to toilet to vomit she passed out in a hallway on way to toilet.  She woke on couch with many attending her including EMS. She thought she was out for 5 min.  She was out long enough for EMS to arrive.   EKG I personally reviewed SR to SB with non specific T wave abnormalities. No ectopy.   TELE SR  I personally reviewed. Troponin 0.00 to < 0.03 X 3 Na 139 on admit and K+ 3.3 now with  replacement to 4.1. Cr 0.90 BNP 111 Hgb 12.3, WBC 4.9, plts 175  DDimer was 1.90 and VQ was neg for PE CT abd and pelvis:  No acute CT finding of the abdomen. Hiatal hernia.   Diverticular disease without evidence of acute diverticulitis. 2 V CXR without acute process   Currently still with abd pain, did not want me to press on it, lower abd pain and mild chest pain.   Past Medical History:  Diagnosis Date  . Arthritis    R knee, hands   . Blood transfusion    1986- post-partial hysterectomy  . Breast cancer [facility]) 2009   right lumpectomy and radiation  . Chronic kidney disease   . Colon cancer [facility]) 2003   per pt report, cancer found in a colon polyp in 2003, procedure done in [place].  did not require surgery, chemo etc.    . Family history of breast cancer 10/24/2017  . GERD (gastroesophageal reflux disease)    recently taken off anti reflux tx  . Heart murmur    functional murmur, echo in [place]- long time ago   . Hepatitis    Hep. A- 1978  . Hypertension   . Lupus ([place])   . Personal history of radiation therapy 2009  . Renal disorder   . Syncope and collapse     Past Surgical History:  Procedure Laterality Date  . ABDOMINAL HYSTERECTOMY     1986  . APPENDECTOMY     1978 & tubal ligation   . BREAST EXCISIONAL BIOPSY Left 1998  . BREAST SURGERY     R breast lumpectomy  . CHOLECYSTECTOMY     1978- open   . REPLACEMENT TOTAL KNEE Left   . TONSILLECTOMY     1954  . TOTAL KNEE ARTHROPLASTY  08/30/2011   Procedure: TOTAL KNEE ARTHROPLASTY;  Surgeon: Kerin Salen;  Location: [place];  Service: Orthopedics;  Laterality: Right;  Right Total Knee Arthroplasty  . TUBAL LIGATION       Home Medications:  Prior to Admission medications   Medication Sig Start Date End Date Taking? Authorizing Provider  busPIRone (BUSPAR) 5 MG tablet Take 10 mg by mouth 3 (three) times daily.  06/12/18  Yes [provider]  cholecalciferol (VITAMIN D) 1000 units tablet Take 1,000 Units by  mouth daily.   Yes [provider]  hydrochlorothiazide (HYDRODIURIL) 25 MG tablet Take 25 mg by mouth daily.    Yes [provider]  metoprolol succinate (TOPROL-XL) 25 MG 24 hr tablet Take 2 tablets (50 mg total) by mouth every morning. Patient taking differently: Take 50 mg by mouth daily.  10/03/17  Yes Magrinat, Virgie Dad, MD  omeprazole (PRILOSEC) 40 MG capsule Take 1 capsule (40 mg total) by mouth daily. 02/17/18  Yes Magrinat, Virgie Dad, MD  tamoxifen (NOLVADEX) 20 MG tablet Take 1 tablet (20 mg total) by mouth daily. 02/17/18  Yes Magrinat, Virgie Dad, MD    Inpatient Medications: Scheduled Meds: . busPIRone  10 mg Oral TID  . metoprolol succinate  50 mg Oral Daily  . pantoprazole  40 mg Oral Daily  . sodium chloride flush  3 mL Intravenous Q12H  . tamoxifen  20 mg Oral Daily   Continuous Infusions:  PRN Meds: acetaminophen **OR** acetaminophen, albuterol, morphine injection, nitroGLYCERIN, ondansetron **OR** ondansetron (ZOFRAN) IV, oxyCODONE  Allergies:    Allergies  Allergen Reactions  . Iodine Anaphylaxis  . Iohexol Anaphylaxis     Code: HIVES, Desc: PT IS ALLERGIC TO IODINATED CONTRAST; REACTION INCLUDES FACIAL SWELLING,HIVES,RESPIRATORY DISTRESS;PT HASN'T HAD SCAN W/PREMEDS.;REFUSED CONTRAST OF ANY KIND!  KR   . Latex Anaphylaxis  . Penicillins Anaphylaxis and Rash    Has patient had a PCN reaction causing immediate rash, facial/tongue/throat swelling, SOB or lightheadedness with hypotension: Yes Has patient had a PCN reaction causing severe rash involving mucus membranes or skin necrosis: Yes Has patient had a PCN reaction that required hospitalization: No Has patient had a PCN reaction occurring within the last 10 years: No If all of the above answers are "NO", then may proceed with Cephalosporin use.   . Tetracycline Anaphylaxis  . Milk-Related Compounds Other (See Comments)    Milk causes stomach pain/ all other dairy is ok    Social History:     Social History   Socioeconomic History  . Marital status: Married    Spouse name: Not on file  . Number of children: Not on file  . Years of education: Not on file  . Highest education level: Not on file  Occupational History  . Not on file  Social Needs  . Financial resource strain: Not on file  . Food insecurity:    Worry: Not on file    Inability: Not on file  . Transportation needs:    Medical: Not on file    Non-medical: Not on file  Tobacco  Use  . Smoking status: Never Smoker  . Smokeless tobacco: Never Used  Substance and Sexual Activity  . Alcohol use: No  . Drug use: No  . Sexual activity: Yes  Lifestyle  . Physical activity:    Days per week: Not on file    Minutes per session: Not on file  . Stress: Not on file  Relationships  . Social connections:    Talks on phone: Not on file    Gets together: Not on file    Attends religious service: Not on file    Active member of club or organization: Not on file    Attends meetings of clubs or organizations: Not on file    Relationship status: Not on file  . Intimate partner violence:    Fear of current or ex partner: Not on file    Emotionally abused: Not on file    Physically abused: Not on file    Forced sexual activity: Not on file  Other Topics Concern  . Not on file  Social History Narrative   ** Merged History Encounter **        Family History:    Family History  Problem Relation Age of Onset  . Heart attack Father   . Cancer Other   . Hypertension Other   . Breast cancer Mother 38  . Hypertension Mother   . Hypertension Brother   . Kidney disease Brother   . Breast cancer Maternal Aunt 74  . Cancer Paternal Aunt 2       type unk  . Other Maternal Grandmother 102       brain tumor dx at 61- did not bx or do additional testing on tumor  . Cancer Paternal Grandmother 40       type unk  . Lung cancer Paternal Grandmother   . Anesthesia problems Neg Hx   . Hypotension Neg Hx   . Malignant  hyperthermia Neg Hx   . Pseudochol deficiency Neg Hx   . Colon cancer Neg Hx   . Esophageal cancer Neg Hx   . Stomach cancer Neg Hx   . Pancreatic cancer Neg Hx   . Liver disease Neg Hx      ROS:  Please see the history of present illness.  General:no colds or fevers, no weight changes Skin:no rashes or ulcers HEENT:no blurred vision, no congestion CV:see HPI PUL:see HPI GI:no diarrhea constipation or melena, no indigestion GU:no hematuria, no dysuria MS:no joint pain, no claudication Neuro:+ syncope, no lightheadedness Endo:no diabetes, no thyroid disease  All other ROS reviewed and negative.     Physical Exam/Data:   Vitals:   07/23/18 1919 07/23/18 2301 07/24/18 0607 07/24/18 1100  BP:  107/63 114/63 114/67  Pulse:  (!) 58 (!) 54 60  Resp:  15 16 16   Temp:  (!) 97.5 F (36.4 C) (!) 97.4 F (36.3 C)   TempSrc:  Oral Oral   SpO2:  98% 97% 99%  Weight: 95.6 kg  96.8 kg   Height: 5\' 6"  (1.676 m)       Intake/Output Summary (Last 24 hours) at 07/24/2018 1343 Last data filed at 07/24/2018 0725 Gross per 24 hour  Intake 749.95 ml  Output -  Net 749.95 ml   Filed Weights   07/23/18 1125 07/23/18 1919 07/24/18 0607  Weight: 95.3 kg 95.6 kg 96.8 kg   Body mass index is 34.46 kg/m.  General:  Well nourished, well developed, in no acute distress HEENT: normal Lymph:  no adenopathy Neck: no JVD Endocrine:  No thryomegaly Vascular: No carotid bruits; pedal pulses 2+ bilaterally   Cardiac:  normal S1, S2; RRR; no murmur , cannot re create chest pain with palpation.  Lungs:  clear to auscultation bilaterally, no wheezing, rhonchi or rales  Abd: soft, +tenderness lower ext, no hepatomegaly  Ext: no edema ( has had when she is on her feet a lot, resolves with putting her legs up). Musculoskeletal:  No deformities, BUE and BLE strength normal and equal Skin: warm and dry  Neuro:  Alert and oriented X 3 MAE follows commands., no focal abnormalities noted Psych:  Normal  affect   Relevant CV Studies: Echo 07/20/18 Study Conclusions  - Left ventricle: The cavity size was normal. There was severe   focal basal and mild concentric hypertrophy. Systolic function   was normal. The estimated ejection fraction was in the range of   55% to 60%. Wall motion was normal; there were no regional wall   motion abnormalities. There was an increased relative   contribution of atrial contraction to ventricular filling.   Doppler parameters are consistent with abnormal left ventricular   relaxation (grade 1 diastolic dysfunction). - Aortic valve: Trileaflet; normal thickness, mildly calcified   leaflets. There was mild regurgitation. - Mitral valve: There was trivial regurgitation. Valve area by   pressure half-time: 2.06 cm^2. - Tricuspid valve: There was trivial regurgitation.   Laboratory Data:  Chemistry Recent Labs  Lab 07/23/18 1134 07/24/18 0851  NA 139 140  K 3.3* 4.1  CL 108 109  CO2 24 25  GLUCOSE 101* 86  BUN 9 8  CREATININE 0.90 0.82  CALCIUM 9.2 8.9  GFRNONAA >60 >60  GFRAA >60 >60  ANIONGAP 7 6    Recent Labs  Lab 07/23/18 1134 07/24/18 0851  PROT 6.6 6.2*  ALBUMIN 3.3* 3.0*  AST 30 43*  ALT 18 26  ALKPHOS 68 63  BILITOT 0.3 0.7   Hematology Recent Labs  Lab 07/23/18 1134 07/24/18 0851  WBC 4.9 3.8*  RBC 4.10 3.71*  HGB 12.3 11.1*  HCT 40.3 34.8*  MCV 98.3 93.8  MCH 30.0 29.9  MCHC 30.5 31.9  RDW 13.0 13.2  PLT 175 175   Cardiac Enzymes Recent Labs  Lab 07/23/18 2005 07/24/18 0229 07/24/18 0851  TROPONINI <0.03 <0.03 <0.03    Recent Labs  Lab 07/23/18 1139 07/23/18 1626  TROPIPOC 0.00 0.00    BNP Recent Labs  Lab 07/23/18 1323  BNP 111.0*    DDimer  Recent Labs  Lab 07/23/18 1323  DDIMER 1.90*    Radiology/Studies:  Ct Abdomen Pelvis Wo Contrast  Result Date: 07/23/2018 CLINICAL DATA:  67 year old female with history chest pressure and abdominal pain EXAM: CT ABDOMEN AND PELVIS WITHOUT  CONTRAST TECHNIQUE: Multidetector CT imaging of the abdomen and pelvis was performed following the standard protocol without IV contrast. COMPARISON:  12/25/2014 FINDINGS: Lower chest: No acute abnormality. Hepatobiliary: Unremarkable appearance of liver. Absent gallbladder. Pancreas: Unremarkable Spleen: Unremarkable Adrenals/Urinary Tract: Unremarkable appearance of the adrenal glands. No evidence of hydronephrosis of the right or left kidney. No nephrolithiasis. Unremarkable course of the bilateral ureters. Unremarkable appearance of the urinary bladder. Stomach/Bowel: Hiatal hernia. Unremarkable stomach. Small bowel unremarkable without abnormal distention. No transition. No focal wall thickening. Appendix is not visualized, however, no inflammatory changes are present adjacent to the cecum to indicate an appendicitis. Colonic diverticula are present. No focal inflammatory changes to suggest diverticulitis. Vascular/Lymphatic: Calcifications of the aorta. No lymphadenopathy.  Reproductive: Changes of hysterectomy. Other: Small fat containing umbilical hernia. Musculoskeletal: No acute displaced fracture. Degenerative changes of the lumbar spine. Degenerative changes of the bilateral hips. IMPRESSION: No acute CT finding of the abdomen. Hiatal hernia. Diverticular disease without evidence of acute diverticulitis. Electronically Signed   By: Corrie Mckusick D.O.   On: 07/23/2018 15:25   Dg Chest 2 View  Result Date: 07/23/2018 CLINICAL DATA:  Chest pain EXAM: CHEST - 2 VIEW COMPARISON:  09/27/2017 FINDINGS: There is biapical pleural thickening. There is no focal parenchymal opacity. There is no pleural effusion or pneumothorax. The heart and mediastinal contours are unremarkable. The osseous structures are unremarkable. IMPRESSION: No active cardiopulmonary disease. Electronically Signed   By: Kathreen Devoid   On: 07/23/2018 12:38   Nm Pulmonary Vent And Perf (v/q Scan)  Result Date: 07/23/2018 CLINICAL DATA:   Left-sided chest pain EXAM: NUCLEAR MEDICINE VENTILATION - PERFUSION LUNG SCAN TECHNIQUE: Ventilation images were obtained in multiple projections using inhaled aerosol Tc-83m DTPA. Perfusion images were obtained in multiple projections after intravenous injection of Tc-64m-MAA. RADIOPHARMACEUTICALS:  27.7 mCi of Tc-62m DTPA aerosol inhalation and 4.07 mCi Tc63m-MAA IV COMPARISON:  Chest x-ray from earlier in the same day. FINDINGS: Ventilation: No focal ventilation defect. Perfusion: No wedge shaped peripheral perfusion defects to suggest acute pulmonary embolism. IMPRESSION: No ventilation perfusion mismatch to suggest pulmonary embolism are noted. Electronically Signed   By: Inez Catalina M.D.   On: 07/23/2018 16:33    Assessment and Plan:   1. Syncope - does seem vasovagal, no arrhythmias on tele, neg MI, neg PE, mild orthostatic drop but only to 132/82 from 143/77.   No further cardiac work up, ok to proceed with EGD tomorrow and to keep follow up with Kerin Ransom  PA on 08/08/18 at 2:00PM 2. Palpitations she wore 48 hour holter, results pending. I have called office to see if we can obtain. Just turned in today. 3. abd pain with neg CT abd for EGD in AM she has had a chole. GI and surgery have seen 4. DOE prior and now chest pain,  stable echo, possible ischemia but no WMA on Echo and troponins neg, + premature FH CAD Dr. Johnsie Cancel to see. Last nuc 2012 and was neg for ischemia.  5. Hypokalemia on admit, now replaced 6. LUPUS per IM      For questions or updates, please contact [place] Please consult [email or web address] for contact info under     Signed, Cecilie Kicks, NP  07/24/2018 1:43 PM   Patient examined chart reviewed Discussed care with patient, husband and PA. Exam with black female in no distress lungs clear no buit or murmurs abdomen soft. Likely vagally mediated "syncope"  She has had a good w/u som far TTE with normal EF no valve disease. V/Q negative r/o with no ischemic changes  on ECG and telemetry with benign PVC;s no NSVT Husband turned in event monitor today but she had no symptoms while wearing She is scheduled for EGD tomorrow. She has outpatient f/u with Dr Oval Linsey already schedule for November. No further cardiac w/u indicated in hospital Will sign off  Jenkins Rouge

## 2018-07-24 NOTE — Progress Notes (Signed)
[facility] Progress Note     Subjective: CC-  Patient states that she's feeling somewhat better this morning. Continues to have epigastric abdominal pain, but it's a little better than yesterday. Reports pain as 5/10. No more n/v. Last BM yesterday morning.  Objective: Vital signs in last 24 hours: Temp:  [97.4 F (36.3 C)-98.5 F (36.9 C)] 97.4 F (36.3 C) (11/04 0607) Pulse Rate:  [54-72] 54 (11/04 0607) Resp:  [13-22] 16 (11/04 0607) BP: (107-162)/(63-105) 114/63 (11/04 0607) SpO2:  [96 %-100 %] 97 % (11/04 0607) Weight:  [95.3 kg-96.8 kg] 96.8 kg (11/04 0607) Last BM Date: 07/23/18  Intake/Output from previous day: No intake/output data recorded. Intake/Output this shift: Total I/O In: 750 [I.V.:750] Out: -   PE: Gen:  Alert, NAD, pleasant HEENT: EOM's intact, pupils equal and round Card:  RRR Pulm:  CTAB, no W/R/R, effort normal Abd: Soft, ND, +BS, no HSM, moderate TTP epigastric region and LUQ without peritonitis, very mild lower abdominal TTP Ext:  No BLE edema Psych: A&Ox3  Skin: no rashes noted, warm and dry  Lab Results:  Recent Labs    07/23/18 1134  WBC 4.9  HGB 12.3  HCT 40.3  PLT 175   BMET Recent Labs    07/23/18 1134  NA 139  K 3.3*  CL 108  CO2 24  GLUCOSE 101*  BUN 9  CREATININE 0.90  CALCIUM 9.2   PT/INR No results for input(s): LABPROT, INR in the last 72 hours. CMP     Component Value Date/Time   NA 139 07/23/2018 1134   NA 141 01/26/2017 1508   K 3.3 (L) 07/23/2018 1134   K 3.4 (L) 01/26/2017 1508   CL 108 07/23/2018 1134   CL 102 01/25/2013 0909   CO2 24 07/23/2018 1134   CO2 29 01/26/2017 1508   GLUCOSE 101 (H) 07/23/2018 1134   GLUCOSE 91 01/26/2017 1508   GLUCOSE 81 01/25/2013 0909   BUN 9 07/23/2018 1134   BUN 11.5 01/26/2017 1508   CREATININE 0.90 07/23/2018 1134   CREATININE 0.81 02/17/2018 0924   CREATININE 0.8 01/26/2017 1508   CALCIUM 9.2 07/23/2018 1134   CALCIUM 9.5 01/26/2017 1508   PROT 6.6 07/23/2018 1134   PROT 7.1 01/26/2017 1508   ALBUMIN 3.3 (L) 07/23/2018 1134   ALBUMIN 3.5 01/26/2017 1508   AST 30 07/23/2018 1134   AST 21 02/17/2018 0924   AST 22 01/26/2017 1508   ALT 18 07/23/2018 1134   ALT 15 02/17/2018 0924   ALT 17 01/26/2017 1508   ALKPHOS 68 07/23/2018 1134   ALKPHOS 72 01/26/2017 1508   BILITOT 0.3 07/23/2018 1134   BILITOT <0.2 (L) 02/17/2018 0924   BILITOT 0.38 01/26/2017 1508   GFRNONAA >60 07/23/2018 1134   GFRNONAA >60 02/17/2018 0924   GFRAA >60 07/23/2018 1134   GFRAA >60 02/17/2018 0924   Lipase     Component Value Date/Time   LIPASE 33 07/23/2018 1134       Studies/Results: Ct Abdomen Pelvis Wo Contrast  Result Date: 07/23/2018 CLINICAL DATA:  67 year old female with history chest pressure and abdominal pain EXAM: CT ABDOMEN AND PELVIS WITHOUT CONTRAST TECHNIQUE: Multidetector CT imaging of the abdomen and pelvis was performed following the standard protocol without IV contrast. COMPARISON:  12/25/2014 FINDINGS: Lower chest: No acute abnormality. Hepatobiliary: Unremarkable appearance of liver. Absent gallbladder. Pancreas: Unremarkable Spleen: Unremarkable Adrenals/Urinary Tract: Unremarkable appearance of the adrenal glands. No evidence of hydronephrosis of the right or left kidney. No  nephrolithiasis. Unremarkable course of the bilateral ureters. Unremarkable appearance of the urinary bladder. Stomach/Bowel: Hiatal hernia. Unremarkable stomach. Small bowel unremarkable without abnormal distention. No transition. No focal wall thickening. Appendix is not visualized, however, no inflammatory changes are present adjacent to the cecum to indicate an appendicitis. Colonic diverticula are present. No focal inflammatory changes to suggest diverticulitis. Vascular/Lymphatic: Calcifications of the aorta. No lymphadenopathy. Reproductive: Changes of hysterectomy. Other: Small fat containing umbilical hernia. Musculoskeletal: No acute displaced  fracture. Degenerative changes of the lumbar spine. Degenerative changes of the bilateral hips. IMPRESSION: No acute CT finding of the abdomen. Hiatal hernia. Diverticular disease without evidence of acute diverticulitis. Electronically Signed   By: Corrie Mckusick D.O.   On: 07/23/2018 15:25   Dg Chest 2 View  Result Date: 07/23/2018 CLINICAL DATA:  Chest pain EXAM: CHEST - 2 VIEW COMPARISON:  09/27/2017 FINDINGS: There is biapical pleural thickening. There is no focal parenchymal opacity. There is no pleural effusion or pneumothorax. The heart and mediastinal contours are unremarkable. The osseous structures are unremarkable. IMPRESSION: No active cardiopulmonary disease. Electronically Signed   By: Kathreen Devoid   On: 07/23/2018 12:38   Nm Pulmonary Vent And Perf (v/q Scan)  Result Date: 07/23/2018 CLINICAL DATA:  Left-sided chest pain EXAM: NUCLEAR MEDICINE VENTILATION - PERFUSION LUNG SCAN TECHNIQUE: Ventilation images were obtained in multiple projections using inhaled aerosol Tc-74m DTPA. Perfusion images were obtained in multiple projections after intravenous injection of Tc-32m-MAA. RADIOPHARMACEUTICALS:  27.7 mCi of Tc-86m DTPA aerosol inhalation and 4.07 mCi Tc5m-MAA IV COMPARISON:  Chest x-ray from earlier in the same day. FINDINGS: Ventilation: No focal ventilation defect. Perfusion: No wedge shaped peripheral perfusion defects to suggest acute pulmonary embolism. IMPRESSION: No ventilation perfusion mismatch to suggest pulmonary embolism are noted. Electronically Signed   By: Inez Catalina M.D.   On: 07/23/2018 16:33    Anti-infectives: Anti-infectives (From admission, onward)   None       Assessment/Plan Lupus HTN Anxiety GERD H/o breast cancer s/p bilateral lumpectomy on tamoxifen  Syncope Abdominal pain - Unclear etiology. Repeat labs pending this morning. Workup thus far with negative non-contrast CT, lab work fairly normal, negative cardiac workup, d dimer high with neg vq  scan. Will ask GI to see for possible upper endoscopy.  ID - none FEN - IVF, NPO VTE - SCDs Foley - none Follow up - TBD   LOS: 0 days    Wellington Hampshire , [facility] 07/24/2018, 8:00 AM Pager: [phone] Mon 7:00 am -11:30 AM Tues-Fri 7:00 am-4:30 pm Sat-Sun 7:00 am-11:30 am

## 2018-07-24 NOTE — Consult Note (Addendum)
[facility] Consult: 10:36 AM 07/24/2018  LOS: 0 days    Referring Provider: Dr Hulen Skains  Primary Care Physician:  Suella Broad, FNP Primary Gastroenterologist:  Dr. Fuller Plan.  Previously Dr Collene Mares.      Reason for Consultation: Epigastric and upper abdominal pain.   HPI: [name] is a 67 y.o. female.  PMH Breast cancer: left in 1998, improved with tamoxifen only;  On right 2009 treated with lumpectomy, ongoing Tamoxifen.  Lupus.  Htn.  GERD.  Obesity.  S/p appy, chole, abd hysto, left TKR.   Pt reports colon cancer in a polyp in 2003, did not require surgery/chemo/radiation.  She is pretty sure it was cancer and not just dysplasia. EGD ~ 1970s for suspicion of ulcer: no ulcer per pt.   12/2010 Colonoscopy for FOBT+, constipation.  To TI, good prep.  Scattered tics, O/w normal.     First [facility] GI OV in 01/2018 re constipation, mixed dysphagia, diffuse abd pain.   03/02/18 esophagram: unremarkable dbl contrast study, tiny sliding HH. Takes Omeprazole 40 mg daily.  This has helped her sxs and having less abdominal pain, eating well, no dysphagia.  Her appetite and weight are stable.  Seen in consultation by cardiology late last week for about 8 weeks of palpitations and shortness of breath occurring at rest.  A 48-hour cardiac monitor finished up on Saturday, 07/22/2018 but has not yet been read.  Yesterday morning, about 30 minutes after eating a sausage biscuit from [employer], while she was insurance, she had onset of intense pain in her upper abdomen and epigastrium.  Felt like she had been punched.  There was nausea and she vomited 3 times, socially digested breakfast, no coffee grounds or blood.  Pain was so severe that she passed out.  BM was brown earlier that AM.   No recent NSAIDs. Says the pain is  not like pain she has had in past.       CT ab/pelvis, no contrast: HH, Diverticulosis in colon.   VQ scan normal.   LFTs, renal fx, Troponisns normal.   Hgb 12.3 >> 11.1.  Baseline 12.4.  MCV 93.   D dimer 1.9 (0 - 0.5 is normal)    Past Medical History:  Diagnosis Date  . Arthritis    R knee, hands   . Blood transfusion    1986- post-partial hysterectomy  . Breast cancer [facility]) 2009   right lumpectomy and radiation  . Chronic kidney disease   . Family history of breast cancer 10/24/2017  . GERD (gastroesophageal reflux disease)    recently taken off anti reflux tx  . Heart murmur    functional murmur, echo in [place]- long time ago   . Hepatitis    Hep. A- 1978  . Hypertension   . Lupus ([place])   . No pertinent past medical history    will see cardiac for review this week, 08/24/2011  . Personal history of radiation therapy 2009  . Renal disorder   . Syncope and collapse     Past Surgical History:  Procedure Laterality Date  . ABDOMINAL HYSTERECTOMY     1986  . APPENDECTOMY     1978 & tubal ligation   . BREAST EXCISIONAL BIOPSY Left 1998  . BREAST SURGERY     R breast lumpectomy  . CHOLECYSTECTOMY     1978- open   . REPLACEMENT TOTAL KNEE Left   . TONSILLECTOMY     1954  . TOTAL KNEE ARTHROPLASTY  08/30/2011   Procedure: TOTAL KNEE ARTHROPLASTY;  Surgeon: Kerin Salen;  Location: [facility];  Service: Orthopedics;  Laterality: Right;  Right Total Knee Arthroplasty  . TUBAL LIGATION      Prior to Admission medications   Medication Sig Start Date End Date Taking? Authorizing Provider  busPIRone (BUSPAR) 5 MG tablet Take 10 mg by mouth 3 (three) times daily.  06/12/18  Yes [provider]  cholecalciferol (VITAMIN D) 1000 units tablet Take 1,000 Units by mouth daily.   Yes [provider]  hydrochlorothiazide (HYDRODIURIL) 25 MG tablet Take 25 mg by mouth daily.    Yes [provider]  metoprolol succinate (TOPROL-XL) 25 MG 24 hr tablet Take 2  tablets (50 mg total) by mouth every morning. Patient taking differently: Take 50 mg by mouth daily.  10/03/17  Yes Magrinat, Virgie Dad, MD  omeprazole (PRILOSEC) 40 MG capsule Take 1 capsule (40 mg total) by mouth daily. 02/17/18  Yes Magrinat, Virgie Dad, MD  tamoxifen (NOLVADEX) 20 MG tablet Take 1 tablet (20 mg total) by mouth daily. 02/17/18  Yes Magrinat, Virgie Dad, MD    Scheduled Meds: . busPIRone  10 mg Oral TID  . metoprolol succinate  50 mg Oral Daily  . pantoprazole  40 mg Oral Daily  . sodium chloride flush  3 mL Intravenous Q12H  . tamoxifen  20 mg Oral Daily   Infusions: . 0.9 % NaCl with KCl 40 mEq / L 75 mL/hr (07/23/18 2106)   PRN Meds: acetaminophen **OR** acetaminophen, albuterol, morphine injection, nitroGLYCERIN, ondansetron **OR** ondansetron (ZOFRAN) IV, oxyCODONE   Allergies as of 07/23/2018 - Review Complete 07/23/2018  Allergen Reaction Noted  . Iodine Anaphylaxis 11/03/2007  . Iohexol Anaphylaxis 11/30/2007  . Latex Anaphylaxis 11/03/2007  . Penicillins Anaphylaxis and Rash 11/03/2007  . Tetracycline Anaphylaxis 11/03/2007  . Milk-related compounds Other (See Comments) 11/03/2007    Family History  Problem Relation Age of Onset  . Heart attack Father   . Cancer Other   . Hypertension Other   . Breast cancer Mother 81  . Hypertension Mother   . Hypertension Brother   . Kidney disease Brother   . Breast cancer Maternal Aunt 4  . Cancer Paternal Aunt 13       type unk  . Other Maternal Grandmother 102       brain tumor dx at 104- did not bx or do additional testing on tumor  . Cancer Paternal Grandmother 35       type unk  . Lung cancer Paternal Grandmother   . Anesthesia problems Neg Hx   . Hypotension Neg Hx   . Malignant hyperthermia Neg Hx   . Pseudochol deficiency Neg Hx   . Colon cancer Neg Hx   . Esophageal cancer Neg Hx   . Stomach cancer Neg Hx   . Pancreatic cancer Neg Hx   . Liver disease Neg Hx     Social History    Socioeconomic History  . Marital status: Married    Spouse name: Not on file  . Number  of children: Not on file  . Years of education: Not on file  . Highest education level: Not on file  Occupational History  . Not on file  Social Needs  . Financial resource strain: Not on file  . Food insecurity:    Worry: Not on file    Inability: Not on file  . Transportation needs:    Medical: Not on file    Non-medical: Not on file  Tobacco Use  . Smoking status: Never Smoker  . Smokeless tobacco: Never Used  Substance and Sexual Activity  . Alcohol use: No  . Drug use: No  . Sexual activity: Yes  Lifestyle  . Physical activity:    Days per week: Not on file    Minutes per session: Not on file  . Stress: Not on file  Relationships  . Social connections:    Talks on phone: Not on file    Gets together: Not on file    Attends religious service: Not on file    Active member of club or organization: Not on file    Attends meetings of clubs or organizations: Not on file    Relationship status: Not on file  . Intimate partner violence:    Fear of current or ex partner: Not on file    Emotionally abused: Not on file    Physically abused: Not on file    Forced sexual activity: Not on file  Other Topics Concern  . Not on file  Social History Narrative   ** Merged History Encounter **        REVIEW OF SYSTEMS: Constitutional:  no fatigue ENT:  No nose bleeds Pulm: Currently not short of breath but recently seen for dyspnea by the cardiologist. CV: Daily palpitations, dependent LE edema is been up on her feet for a while..  GU:  No hematuria, no frequency GI:  Per HPI Heme: Denies unusual bleeding or bruising. Transfusions: None Neuro: Syncope yesterday, has not recurred. Derm:  No itching, no rash or sores.  Has not had a flare of her lupus in a while, when she does she gets fevers, achiness all over, malaise. Endocrine:  No sweats or chills.  No polyuria or  dysuria Immunization: Not queried. Travel:  None beyond local counties in last few months.    PHYSICAL EXAM: Vital signs in last 24 hours: Vitals:   07/23/18 2301 07/24/18 0607  BP: 107/63 114/63  Pulse: (!) 58 (!) 54  Resp: 15 16  Temp: (!) 97.5 F (36.4 C) (!) 97.4 F (36.3 C)  SpO2: 98% 97%   Wt Readings from Last 3 Encounters:  07/24/18 96.8 kg  07/11/18 97.1 kg  02/17/18 94.1 kg    General: Patient looks well. Head: No facial asymmetry or swelling.  No signs of head trauma. Eyes: Scleral icterus.  No conjunctival pallor.  EOMI. Ears: Not hard of hearing. Nose: No discharge or congestion. Mouth: Upper dentures in place.  Oral mucosa is pink, moist, clear.  Remaining teeth in fair condition.  Tongue midline. Neck: No JVD, no masses, no thyromegaly. Lungs: Clear bilaterally.  No dyspnea, no cough. Heart: RRR.  No MRG.  S1, S2 present. Abdomen: Soft.  Diffusely tender to light touch.  Bowel sounds normal quality but hypoactive.  No HSM, masses, bruits, hernias. Rectal: Not performed. Musc/Skeltl: No joint redness, swelling or significant deformity. Extremities: No CCE. Neurologic: Oriented x3.  Alert.  No gross deficits or weakness.  No tremors. Skin: No rashes, no sores,  no suspicious lesions. Nodes: No cervical adenopathy. Psych: Cooperative, calm, pleasant.  Fluid speech  Intake/Output from previous day: No intake/output data recorded. Intake/Output this shift: Total I/O In: 750 [I.V.:750] Out: -   LAB RESULTS: Recent Labs    07/23/18 1134 07/24/18 0851  WBC 4.9 3.8*  HGB 12.3 11.1*  HCT 40.3 34.8*  PLT 175 175   BMET Lab Results  Component Value Date   NA 140 07/24/2018   NA 139 07/23/2018   NA 141 02/17/2018   K 4.1 07/24/2018   K 3.3 (L) 07/23/2018   K 3.9 02/17/2018   CL 109 07/24/2018   CL 108 07/23/2018   CL 107 02/17/2018   CO2 25 07/24/2018   CO2 24 07/23/2018   CO2 26 02/17/2018   GLUCOSE 86 07/24/2018   GLUCOSE 101 (H) 07/23/2018    GLUCOSE 91 02/17/2018   BUN 8 07/24/2018   BUN 9 07/23/2018   BUN 16 02/17/2018   CREATININE 0.82 07/24/2018   CREATININE 0.90 07/23/2018   CREATININE 0.81 02/17/2018   CALCIUM 8.9 07/24/2018   CALCIUM 9.2 07/23/2018   CALCIUM 9.3 02/17/2018   LFT Recent Labs    07/23/18 1134 07/24/18 0851  PROT 6.6 6.2*  ALBUMIN 3.3* 3.0*  AST 30 43*  ALT 18 26  ALKPHOS 68 63  BILITOT 0.3 0.7   PT/INR Lab Results  Component Value Date   INR 1.09 05/30/2015   INR 1.10 10/05/2014   INR 2.32 (H) 09/02/2011   Hepatitis Panel No results for input(s): HEPBSAG, HCVAB, HEPAIGM, HEPBIGM in the last 72 hours. C-Diff No components found for: CDIFF Lipase     Component Value Date/Time   LIPASE 33 07/23/2018 1134    Drugs of Abuse     Component Value Date/Time   LABOPIA POSITIVE (A) 12/04/2007 1149   COCAINSCRNUR NONE DETECTED 12/04/2007 1149   LABBENZ NONE DETECTED 12/04/2007 1149   AMPHETMU NONE DETECTED 12/04/2007 1149   THCU NONE DETECTED 12/04/2007 1149   LABBARB  12/04/2007 1149    NONE DETECTED        DRUG SCREEN FOR MEDICAL PURPOSES ONLY.  IF CONFIRMATION IS NEEDED FOR ANY PURPOSE, NOTIFY LAB WITHIN 5 DAYS.     RADIOLOGY STUDIES: Ct Abdomen Pelvis Wo Contrast  Result Date: 07/23/2018 CLINICAL DATA:  67 year old female with history chest pressure and abdominal pain EXAM: CT ABDOMEN AND PELVIS WITHOUT CONTRAST TECHNIQUE: Multidetector CT imaging of the abdomen and pelvis was performed following the standard protocol without IV contrast. COMPARISON:  12/25/2014 FINDINGS: Lower chest: No acute abnormality. Hepatobiliary: Unremarkable appearance of liver. Absent gallbladder. Pancreas: Unremarkable Spleen: Unremarkable Adrenals/Urinary Tract: Unremarkable appearance of the adrenal glands. No evidence of hydronephrosis of the right or left kidney. No nephrolithiasis. Unremarkable course of the bilateral ureters. Unremarkable appearance of the urinary bladder. Stomach/Bowel: Hiatal  hernia. Unremarkable stomach. Small bowel unremarkable without abnormal distention. No transition. No focal wall thickening. Appendix is not visualized, however, no inflammatory changes are present adjacent to the cecum to indicate an appendicitis. Colonic diverticula are present. No focal inflammatory changes to suggest diverticulitis. Vascular/Lymphatic: Calcifications of the aorta. No lymphadenopathy. Reproductive: Changes of hysterectomy. Other: Small fat containing umbilical hernia. Musculoskeletal: No acute displaced fracture. Degenerative changes of the lumbar spine. Degenerative changes of the bilateral hips. IMPRESSION: No acute CT finding of the abdomen. Hiatal hernia. Diverticular disease without evidence of acute diverticulitis. Electronically Signed   By: Corrie Mckusick D.O.   On: 07/23/2018 15:25   Dg Chest 2 View  Result Date: 07/23/2018 CLINICAL DATA:  Chest pain EXAM: CHEST - 2 VIEW COMPARISON:  09/27/2017 FINDINGS: There is biapical pleural thickening. There is no focal parenchymal opacity. There is no pleural effusion or pneumothorax. The heart and mediastinal contours are unremarkable. The osseous structures are unremarkable. IMPRESSION: No active cardiopulmonary disease. Electronically Signed   By: Kathreen Devoid   On: 07/23/2018 12:38   Nm Pulmonary Vent And Perf (v/q Scan)  Result Date: 07/23/2018 CLINICAL DATA:  Left-sided chest pain EXAM: NUCLEAR MEDICINE VENTILATION - PERFUSION LUNG SCAN TECHNIQUE: Ventilation images were obtained in multiple projections using inhaled aerosol Tc-37m DTPA. Perfusion images were obtained in multiple projections after intravenous injection of Tc-72m-MAA. RADIOPHARMACEUTICALS:  27.7 mCi of Tc-58m DTPA aerosol inhalation and 4.07 mCi Tc45m-MAA IV COMPARISON:  Chest x-ray from earlier in the same day. FINDINGS: Ventilation: No focal ventilation defect. Perfusion: No wedge shaped peripheral perfusion defects to suggest acute pulmonary embolism. IMPRESSION: No  ventilation perfusion mismatch to suggest pulmonary embolism are noted. Electronically Signed   By: Inez Catalina M.D.   On: 07/23/2018 16:33      IMPRESSION:   *     Acute upper abdominal/epigastric pain. Laboratory studies unrevealing.  CT scan showing small hiatal hernia, chronic diverticulosis.  *    Hx colon cancer, 2003.  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 Had colonoscopy in 12/2010, scattered diverticulosis noted.  *  2 months of palpitations and dyspnea spells.  48 hour cardiac monitoring finished on Sat 11/2, has not yet been read.      PLAN:     *  EGD set for 9 AM tomorrow.  Allow full liquids now, NPO after MN Continue oral Protonix.    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     Azucena Freed  07/24/2018, 10:36 AM Phone [phone]

## 2018-07-24 NOTE — Progress Notes (Signed)
[facility] PROGRESS NOTE  [name] DPO:[id number] DOB: 1951-06-12 DOA: 07/23/2018 PCP: Suella Broad, FNP  Assessment/Plan:  1. Syncope and collapse: Seems vasovagal in nature. BP does drop somewhat from sitting to standing.  EKG and POC troponins neg x2: no events on telemetry.  Patient reported that she had history of fast and slow heart rate.  TTE 10/31: LVEF 55-60% and grade 1 diastolic dysfunction.  will consult cardiology 2. Abdominal pain: Unclear etiology. Remains tender on exam.  CT abdomen and pelvis however was without contrast and does not have acute findings.  Lipase is normal.  Lactate within limits of normal. Evaluated by general surgery who opine not likely ischemia and recommend gi consult. Continue  NPO, IV fluids and PPI   3. Chest pain: Unclear etiology.  EKG, troponins negative.  D-dimer elevated.  No CTA chest done due to history of anaphylaxis to contrast.  VQ scan negative for PE.  last month evaluated for "palpatations" by cardiology and had echo and holter monitor. Echo with ef 60% and grade 1 DD. Results of Holter pending. Will request cards consult to review 4. Essential hypertension: BP low end of normal. Home HCTZ on hold. Continue beta-blockers.  Continue to hold HCTZ 5. Hypokalemia: Replacing IV fluids and follow BMP. 6. Lupus: No current flare.  Her flares are usually associated with rash and myalgias.  Patient follows with outpatient rheumatology. 7. GERD: Continue PPI. 8. Palpitations: No events on telemetry.  Cardiology consultation to review recent Holter test results. 9. Anxiety: She is irritable but no acute distress.  Not on medications at home.  Code Status: full Family Communication: none present Disposition Plan: home   Consultants:  Donne Hazel [facility] [place] [facility]  Procedures:    Antibiotics:    HPI/Subjective: Awake alert irritated. Improved pain  Patient admitted sudden onset epigastric pain and syncope.  Abdominal exam concerning. Workup negative for acute abdomen or ACs. GI consulted today for possible EGD. Remains npo  Objective: Vitals:   07/23/18 2301 07/24/18 0607  BP: 107/63 114/63  Pulse: (!) 58 (!) 54  Resp: 15 16  Temp: (!) 97.5 F (36.4 C) (!) 97.4 F (36.3 C)  SpO2: 98% 97%    Intake/Output Summary (Last 24 hours) at 07/24/2018 1021 Last data filed at 07/24/2018 0725 Gross per 24 hour  Intake 749.95 ml  Output -  Net 749.95 ml   Filed Weights   07/23/18 1125 07/23/18 1919 07/24/18 0607  Weight: 95.3 kg 95.6 kg 96.8 kg    Exam:   General:  Well nourished, awake alert in no acute distress  Cardiovascular: rrr no mgr no LE edema  Respiratory: normal effort BS clear bilaterally no wheeze  Abdomen: obese soft sluggish BS moderate tenderness to palpation particularly left upper quadrant and epigastric area  Musculoskeletal: no swelling/erythema    Data Reviewed: Basic Metabolic Panel: Recent Labs  Lab 07/23/18 1134  NA 139  K 3.3*  CL 108  CO2 24  GLUCOSE 101*  BUN 9  CREATININE 0.90  CALCIUM 9.2   Liver Function Tests: Recent Labs  Lab 07/23/18 1134  AST 30  ALT 18  ALKPHOS 68  BILITOT 0.3  PROT 6.6  ALBUMIN 3.3*   Recent Labs  Lab 07/23/18 1134  LIPASE 33   No results for input(s): AMMONIA in the last 168 hours. CBC: Recent Labs  Lab 07/23/18 1134 07/24/18 0851  WBC 4.9 3.8*  NEUTROABS 2.0  --   HGB 12.3 11.1*  HCT 40.3  34.8*  MCV 98.3 93.8  PLT 175 175   Cardiac Enzymes: Recent Labs  Lab 07/23/18 2005 07/24/18 0229  TROPONINI <0.03 <0.03   BNP (last 3 results) Recent Labs    07/23/18 1323  BNP 111.0*    ProBNP (last 3 results) No results for input(s): PROBNP in the last 8760 hours.  CBG: No results for input(s): GLUCAP in the last 168 hours.  No results found for this or any previous visit (from the past 240 hour(s)).   Studies: Ct Abdomen Pelvis Wo Contrast  Result Date: 07/23/2018 CLINICAL DATA:   67 year old female with history chest pressure and abdominal pain EXAM: CT ABDOMEN AND PELVIS WITHOUT CONTRAST TECHNIQUE: Multidetector CT imaging of the abdomen and pelvis was performed following the standard protocol without IV contrast. COMPARISON:  12/25/2014 FINDINGS: Lower chest: No acute abnormality. Hepatobiliary: Unremarkable appearance of liver. Absent gallbladder. Pancreas: Unremarkable Spleen: Unremarkable Adrenals/Urinary Tract: Unremarkable appearance of the adrenal glands. No evidence of hydronephrosis of the right or left kidney. No nephrolithiasis. Unremarkable course of the bilateral ureters. Unremarkable appearance of the urinary bladder. Stomach/Bowel: Hiatal hernia. Unremarkable stomach. Small bowel unremarkable without abnormal distention. No transition. No focal wall thickening. Appendix is not visualized, however, no inflammatory changes are present adjacent to the cecum to indicate an appendicitis. Colonic diverticula are present. No focal inflammatory changes to suggest diverticulitis. Vascular/Lymphatic: Calcifications of the aorta. No lymphadenopathy. Reproductive: Changes of hysterectomy. Other: Small fat containing umbilical hernia. Musculoskeletal: No acute displaced fracture. Degenerative changes of the lumbar spine. Degenerative changes of the bilateral hips. IMPRESSION: No acute CT finding of the abdomen. Hiatal hernia. Diverticular disease without evidence of acute diverticulitis. Electronically Signed   By: Corrie Mckusick D.O.   On: 07/23/2018 15:25   Dg Chest 2 View  Result Date: 07/23/2018 CLINICAL DATA:  Chest pain EXAM: CHEST - 2 VIEW COMPARISON:  09/27/2017 FINDINGS: There is biapical pleural thickening. There is no focal parenchymal opacity. There is no pleural effusion or pneumothorax. The heart and mediastinal contours are unremarkable. The osseous structures are unremarkable. IMPRESSION: No active cardiopulmonary disease. Electronically Signed   By: Jaclyn Devoid   On:  07/23/2018 12:38   Nm Pulmonary Vent And Perf (v/q Scan)  Result Date: 07/23/2018 CLINICAL DATA:  Left-sided chest pain EXAM: NUCLEAR MEDICINE VENTILATION - PERFUSION LUNG SCAN TECHNIQUE: Ventilation images were obtained in multiple projections using inhaled aerosol Tc-16m DTPA. Perfusion images were obtained in multiple projections after intravenous injection of Tc-72m-MAA. RADIOPHARMACEUTICALS:  27.7 mCi of Tc-5m DTPA aerosol inhalation and 4.07 mCi Tc61m-MAA IV COMPARISON:  Chest x-ray from earlier in the same day. FINDINGS: Ventilation: No focal ventilation defect. Perfusion: No wedge shaped peripheral perfusion defects to suggest acute pulmonary embolism. IMPRESSION: No ventilation perfusion mismatch to suggest pulmonary embolism are noted. Electronically Signed   By: Inez Catalina M.D.   On: 07/23/2018 16:33    Scheduled Meds: . busPIRone  10 mg Oral TID  . metoprolol succinate  50 mg Oral Daily  . pantoprazole  40 mg Oral Daily  . sodium chloride flush  3 mL Intravenous Q12H  . tamoxifen  20 mg Oral Daily   Continuous Infusions: . 0.9 % NaCl with KCl 40 mEq / L 75 mL/hr (07/23/18 2106)    Principal Problem:   Syncope and collapse Active Problems:   Abdominal pain   HYPERTENSION, BENIGN ESSENTIAL   GERD   Palpitations   Chest pain   Hypokalemia   LUPUS    Time spent: 30  Radene Gunning NP  [facility]  If 7PM-7AM, please contact night-coverage at [email or web address], password [facility] 07/24/2018, 10:21 AM  LOS: 0 days

## 2018-07-24 NOTE — Care Management Obs Status (Signed)
[facility] NOTIFICATION   Patient Details  Name: [name] MRN: [id number] Date of Birth: [birth year]   Medicare Observation Status Notification Given:  Yes    Midge Minium RN, BSN, NCM-BC, ACM-RN [phone] 07/24/2018, 4:09 PM

## 2018-07-25 ENCOUNTER — Observation Stay (HOSPITAL_COMMUNITY): Payer: Medicare HMO | Admitting: Certified Registered Nurse Anesthetist

## 2018-07-25 ENCOUNTER — Encounter (HOSPITAL_COMMUNITY)
Admission: EM | Disposition: A | Discharge status: Home / Self Care | Payer: Self-pay | Source: Home / Self Care | Attending: Emergency Medicine

## 2018-07-25 ENCOUNTER — Encounter (HOSPITAL_COMMUNITY): Payer: Self-pay | Admitting: *Deleted

## 2018-07-25 DIAGNOSIS — K2990 Gastroduodenitis, unspecified, without bleeding: Secondary | ICD-10-CM

## 2018-07-25 DIAGNOSIS — R1013 Epigastric pain: Secondary | ICD-10-CM | POA: Diagnosis not present

## 2018-07-25 DIAGNOSIS — K449 Diaphragmatic hernia without obstruction or gangrene: Secondary | ICD-10-CM | POA: Diagnosis not present

## 2018-07-25 DIAGNOSIS — R55 Syncope and collapse: Secondary | ICD-10-CM | POA: Diagnosis not present

## 2018-07-25 DIAGNOSIS — K2970 Gastritis, unspecified, without bleeding: Secondary | ICD-10-CM | POA: Diagnosis not present

## 2018-07-25 DIAGNOSIS — K2950 Unspecified chronic gastritis without bleeding: Secondary | ICD-10-CM | POA: Diagnosis not present

## 2018-07-25 HISTORY — PX: BIOPSY: SHX5522

## 2018-07-25 HISTORY — PX: ESOPHAGOGASTRODUODENOSCOPY (EGD) WITH PROPOFOL: SHX5813

## 2018-07-25 SURGERY — ESOPHAGOGASTRODUODENOSCOPY (EGD) WITH PROPOFOL
Anesthesia: Monitor Anesthesia Care

## 2018-07-25 MED ORDER — PROPOFOL 500 MG/50ML IV EMUL
INTRAVENOUS | Status: DC | PRN
  Administered 2018-07-25: 100 ug/kg/min via INTRAVENOUS

## 2018-07-25 MED ORDER — LACTATED RINGERS IV SOLN
INTRAVENOUS | Status: DC | PRN
  Administered 2018-07-25: 08:00:00 via INTRAVENOUS

## 2018-07-25 MED ORDER — LACTATED RINGERS IV SOLN
INTRAVENOUS | Status: AC | PRN
  Administered 2018-07-25: 1000 mL via INTRAVENOUS

## 2018-07-25 MED ORDER — ONDANSETRON HCL 4 MG/2ML IJ SOLN
INTRAMUSCULAR | Status: DC | PRN
  Administered 2018-07-25: 4 mg via INTRAVENOUS

## 2018-07-25 MED ORDER — LIDOCAINE 2% (20 MG/ML) 5 ML SYRINGE
INTRAMUSCULAR | Status: DC | PRN
  Administered 2018-07-25: 100 mg via INTRAVENOUS

## 2018-07-25 MED ORDER — PROPOFOL 10 MG/ML IV BOLUS
INTRAVENOUS | Status: DC | PRN
  Administered 2018-07-25: 20 mg via INTRAVENOUS
  Administered 2018-07-25: 30 mg via INTRAVENOUS

## 2018-07-25 SURGICAL SUPPLY — 15 items

## 2018-07-25 NOTE — Interval H&P Note (Signed)
History and Physical Interval Note:  07/25/2018 7:53 AM  [name]  has presented today for surgery, with the diagnosis of Upper abdominal/epigastric pain severe.  Nonbloody emesis.  Syncope due to pain  The various methods of treatment have been discussed with the patient and family. After consideration of risks, benefits and other options for treatment, the patient has consented to  Procedure(s): ESOPHAGOGASTRODUODENOSCOPY (EGD) WITH PROPOFOL (N/A) as a surgical intervention .  The patient's history has been reviewed, patient examined, no change in status, stable for surgery.  I have reviewed the patient's chart and labs.  Questions were answered to the patient's satisfaction.     Milus Banister

## 2018-07-25 NOTE — Discharge Summary (Signed)
Physician Discharge Summary  [name] VVO:[id number] DOB: 04/25/1951 DOA: 07/23/2018  PCP: Suella Broad, FNP  Admit date: 07/23/2018 Discharge date: 07/25/2018  Time spent: 40 minutes  Recommendations for Outpatient Follow-up:  1. Follow up with PCP as need 2. GI will contact you with results of labs done from EGD   Discharge Diagnoses:  Principal Problem:   Syncope and collapse Active Problems:   Hiatal hernia   Abdominal pain   Abdominal pain, epigastric   Gastritis and gastroduodenitis   HYPERTENSION, BENIGN ESSENTIAL   GERD   Palpitations   Chest pain   Hypokalemia   LUPUS   Discharge Condition: stable tolerating regular diet  Diet recommendation: heart healthy   Filed Weights   07/24/18 0607 07/25/18 0342 07/25/18 0751  Weight: 96.8 kg 96.8 kg 95.3 kg    History of present illness:  Patient admitted sudden onset epigastric pain and syncope. Abdominal exam concerning. Workup negative for acute abdomen or ACs. GI consulted and EGD done with mild distal gastritis and 2cm hiatal hernia. Biopsies pending  Hospital Course:  1. Syncope and collapse: likely vasovagal in nature. BP does drop somewhat from sitting to standing. EKG and POC troponins neg x2: no events on telemetry. Patient reported that she had history of fast and slow heart rate. TTE 10/31: LVEF 55-60% and grade 1 diastolic dysfunction. Evaluated by cardiology who opine no further work up as patient has had workupwith TTe normal EF, VQ negative, EKGwithout ischemic changes. Recommend OP follow up as [facility] 11/19 2. Abdominal pain/Gastitis: EGD with distal gastritis, 2cm hiatal hernia. Biopsies pending. Recommends resuming regular diet, PPI and GI will contact with results of biopsies. CT abdomen and pelvis however was without contrast and does not have acute findings. Lipase is normal. Lactate within limits of normal. Evaluated by general surgery who opine not likely ischemia and recommend  gi consult. Tolerating diet at discharge 3. Chest pain: likely related to #2. EKG, troponins negative. D-dimer elevated. No CTA chest done due to history of anaphylaxis to contrast. VQ scan negative for PE. last month evaluated for "palpatations" by cardiology and had echo and holter monitor. Echo with ef 60% and grade 1 DD. Results of Holter pending. Evaluated by cardiology. See #1 4. Essential hypertension:BP low end of normal initially. Home HCTZ held. At discharge BP high end of normal. Will resume HCTZ at discharge 5. Hypokalemia:Replaced and resolved.  6. Lupus:No current flare. Her flares are usually associated with rash and myalgias. Patient follows with outpatient rheumatology. 7. GERD:Continue PPI. See #2 8. Palpitations: No events on telemetry. Cardiology consultation to review recent Holter test results. 9. Anxiety: She is irritable but no acute distress. Not on medications at home.   Procedures:  EGD  Consultations:  nishan cards  Elwyn Reach  Discharge Exam: Vitals:   07/25/18 0830 07/25/18 1213  BP: 113/73 (!) 167/78  Pulse: 69 67  Resp: 16 16  Temp:    SpO2: 100% 99%    General: well nourished in no acute distress Cardiovascular: rrr no mgr no LE edema Respiratory: normal effort BS clear bilaterally  Discharge Instructions   Discharge Instructions    Diet - low sodium heart healthy   Complete by:  As directed    Discharge instructions   Complete by:  As directed    Continue home meds as instructed May add TUMS over the counter GI will be in touch with results of biopsies Follow up with PCP as needed   Increase activity slowly  Complete by:  As directed      Allergies as of 07/25/2018      Reactions   Iodine Anaphylaxis   Iohexol Anaphylaxis    Code: HIVES, Desc: PT IS ALLERGIC TO IODINATED CONTRAST; REACTION INCLUDES FACIAL SWELLING,HIVES,RESPIRATORY DISTRESS;PT HASN'T HAD SCAN W/PREMEDS.;REFUSED CONTRAST OF ANY KIND!  KR   Latex  Anaphylaxis   Penicillins Anaphylaxis, Rash   Has patient had a PCN reaction causing immediate rash, facial/tongue/throat swelling, SOB or lightheadedness with hypotension: Yes Has patient had a PCN reaction causing severe rash involving mucus membranes or skin necrosis: Yes Has patient had a PCN reaction that required hospitalization: No Has patient had a PCN reaction occurring within the last 10 years: No If all of the above answers are "NO", then may proceed with Cephalosporin use.   Tetracycline Anaphylaxis      Medication List    TAKE these medications   busPIRone 5 MG tablet Commonly known as:  BUSPAR Take 10 mg by mouth 3 (three) times daily.   cholecalciferol 1000 units tablet Commonly known as:  VITAMIN D Take 1,000 Units by mouth daily.   hydrochlorothiazide 25 MG tablet Commonly known as:  HYDRODIURIL Take 25 mg by mouth daily.   metoprolol succinate 25 MG 24 hr tablet Commonly known as:  TOPROL-XL Take 2 tablets (50 mg total) by mouth every morning. What changed:  when to take this   omeprazole 40 MG capsule Commonly known as:  PRILOSEC Take 1 capsule (40 mg total) by mouth daily.   tamoxifen 20 MG tablet Commonly known as:  NOLVADEX Take 1 tablet (20 mg total) by mouth daily.      Allergies  Allergen Reactions  . Iodine Anaphylaxis  . Iohexol Anaphylaxis     Code: HIVES, Desc: PT IS ALLERGIC TO IODINATED CONTRAST; REACTION INCLUDES FACIAL SWELLING,HIVES,RESPIRATORY DISTRESS;PT HASN'T HAD SCAN W/PREMEDS.;REFUSED CONTRAST OF ANY KIND!  KR   . Latex Anaphylaxis  . Penicillins Anaphylaxis and Rash    Has patient had a PCN reaction causing immediate rash, facial/tongue/throat swelling, SOB or lightheadedness with hypotension: Yes Has patient had a PCN reaction causing severe rash involving mucus membranes or skin necrosis: Yes Has patient had a PCN reaction that required hospitalization: No Has patient had a PCN reaction occurring within the last 10 years:  No If all of the above answers are "NO", then may proceed with Cephalosporin use.   . Tetracycline Anaphylaxis      The results of significant diagnostics from this hospitalization (including imaging, microbiology, ancillary and laboratory) are listed below for reference.    Significant Diagnostic Studies: Ct Abdomen Pelvis Wo Contrast  Result Date: 07/23/2018 CLINICAL DATA:  67 year old female with history chest pressure and abdominal pain EXAM: CT ABDOMEN AND PELVIS WITHOUT CONTRAST TECHNIQUE: Multidetector CT imaging of the abdomen and pelvis was performed following the standard protocol without IV contrast. COMPARISON:  12/25/2014 FINDINGS: Lower chest: No acute abnormality. Hepatobiliary: Unremarkable appearance of liver. Absent gallbladder. Pancreas: Unremarkable Spleen: Unremarkable Adrenals/Urinary Tract: Unremarkable appearance of the adrenal glands. No evidence of hydronephrosis of the right or left kidney. No nephrolithiasis. Unremarkable course of the bilateral ureters. Unremarkable appearance of the urinary bladder. Stomach/Bowel: Hiatal hernia. Unremarkable stomach. Small bowel unremarkable without abnormal distention. No transition. No focal wall thickening. Appendix is not visualized, however, no inflammatory changes are present adjacent to the cecum to indicate an appendicitis. Colonic diverticula are present. No focal inflammatory changes to suggest diverticulitis. Vascular/Lymphatic: Calcifications of the aorta. No lymphadenopathy. Reproductive: Changes of hysterectomy.  Other: Small fat containing umbilical hernia. Musculoskeletal: No acute displaced fracture. Degenerative changes of the lumbar spine. Degenerative changes of the bilateral hips. IMPRESSION: No acute CT finding of the abdomen. Hiatal hernia. Diverticular disease without evidence of acute diverticulitis. Electronically Signed   By: Corrie Mckusick D.O.   On: 07/23/2018 15:25   Dg Chest 2 View  Result Date:  07/23/2018 CLINICAL DATA:  Chest pain EXAM: CHEST - 2 VIEW COMPARISON:  09/27/2017 FINDINGS: There is biapical pleural thickening. There is no focal parenchymal opacity. There is no pleural effusion or pneumothorax. The heart and mediastinal contours are unremarkable. The osseous structures are unremarkable. IMPRESSION: No active cardiopulmonary disease. Electronically Signed   By: Kathreen Devoid   On: 07/23/2018 12:38   Nm Pulmonary Vent And Perf (v/q Scan)  Result Date: 07/23/2018 CLINICAL DATA:  Left-sided chest pain EXAM: NUCLEAR MEDICINE VENTILATION - PERFUSION LUNG SCAN TECHNIQUE: Ventilation images were obtained in multiple projections using inhaled aerosol Tc-35m DTPA. Perfusion images were obtained in multiple projections after intravenous injection of Tc-100m-MAA. RADIOPHARMACEUTICALS:  27.7 mCi of Tc-83m DTPA aerosol inhalation and 4.07 mCi Tc56m-MAA IV COMPARISON:  Chest x-ray from earlier in the same day. FINDINGS: Ventilation: No focal ventilation defect. Perfusion: No wedge shaped peripheral perfusion defects to suggest acute pulmonary embolism. IMPRESSION: No ventilation perfusion mismatch to suggest pulmonary embolism are noted. Electronically Signed   By: Inez Catalina M.D.   On: 07/23/2018 16:33    Microbiology: No results found for this or any previous visit (from the past 240 hour(s)).   Labs: Basic Metabolic Panel: Recent Labs  Lab 07/23/18 1134 07/24/18 0851  NA 139 140  K 3.3* 4.1  CL 108 109  CO2 24 25  GLUCOSE 101* 86  BUN 9 8  CREATININE 0.90 0.82  CALCIUM 9.2 8.9   Liver Function Tests: Recent Labs  Lab 07/23/18 1134 07/24/18 0851  AST 30 43*  ALT 18 26  ALKPHOS 68 63  BILITOT 0.3 0.7  PROT 6.6 6.2*  ALBUMIN 3.3* 3.0*   Recent Labs  Lab 07/23/18 1134  LIPASE 33   No results for input(s): AMMONIA in the last 168 hours. CBC: Recent Labs  Lab 07/23/18 1134 07/24/18 0851  WBC 4.9 3.8*  NEUTROABS 2.0  --   HGB 12.3 11.1*  HCT 40.3 34.8*  MCV 98.3  93.8  PLT 175 175   Cardiac Enzymes: Recent Labs  Lab 07/23/18 2005 07/24/18 0229 07/24/18 0851  TROPONINI <0.03 <0.03 <0.03   BNP: BNP (last 3 results) Recent Labs    07/23/18 1323  BNP 111.0*    ProBNP (last 3 results) No results for input(s): PROBNP in the last 8760 hours.  CBG: No results for input(s): GLUCAP in the last 168 hours.     SignedRadene Gunning NP  [facility] 07/25/2018, 1:23 PM

## 2018-07-25 NOTE — Op Note (Signed)
[facility] Patient Name: [name] Procedure Date : 07/25/2018 MRN: [id number] Attending MD: Milus Banister , MD Date of Birth: 1951-01-09 CSN: [id number] Age: 67 Admit Type: Inpatient Procedure:                Upper GI endoscopy Indications:              Epigastric abdominal pain; normal cmet, CT scan                            abd/pelvis, very remote open cholecystectomy, CBC                            slight anemia Providers:                Milus Banister, MD, Carlyn Reichert, RN, Tinnie Gens, Technician, Lance Coon, CRNA Referring MD:              Medicines:                Monitored Anesthesia Care Complications:            No immediate complications. Estimated blood loss:                            None. Estimated Blood Loss:     Estimated blood loss: none. Procedure:                Pre-Anesthesia Assessment:                           - Prior to the procedure, a History and Physical                            was performed, and patient medications and                            allergies were reviewed. The patient's tolerance of                            previous anesthesia was also reviewed. The risks                            and benefits of the procedure and the sedation                            options and risks were discussed with the patient.                            All questions were answered, and informed consent                            was obtained. Prior Anticoagulants: The patient has  taken no previous anticoagulant or antiplatelet                            agents. ASA Grade Assessment: II - A patient with                            mild systemic disease. After reviewing the risks                            and benefits, the patient was deemed in                            satisfactory condition to undergo the procedure.                           After obtaining informed consent,  the endoscope was                            passed under direct vision. Throughout the                            procedure, the patient's blood pressure, pulse, and                            oxygen saturations were monitored continuously. The                            GIF-H190 ([id number]) Olympus adult EGD was introduced                            through the mouth, and advanced to the second part                            of duodenum. The upper GI endoscopy was                            accomplished without difficulty. The patient                            tolerated the procedure well. Scope In: Scope Out: Findings:      Minimal inflammation characterized by erythema was found in the gastric       antrum. Biopsies were taken with a cold forceps for histology.      A 2 cm hiatal hernia was present.      The exam was otherwise without abnormality. Impression:               - Minimal distal gastritis, biopsied.                           - Small, 2cm hiatal hernia.                           - The examination was otherwise normal. Recommendation:           - OK to  discharge home from GI perspective.                           - Resume previous diet.                           - Continue present medications (once daily PPI)                           - Await pathology results. Will start appropriate                            antibiotics if + for H. pylori. Procedure Code(s):        --- Professional ---                           [phone], Esophagogastroduodenoscopy, flexible,                            transoral; with biopsy, single or multiple Diagnosis Code(s):        --- Professional ---                           K29.70, Gastritis, unspecified, without bleeding                           K44.9, Diaphragmatic hernia without obstruction or                            gangrene                           R10.13, Epigastric pain CPT copyright 2018 [employer]. All rights  reserved. The codes documented in this report are preliminary and upon coder review may  be revised to meet current compliance requirements. Milus Banister, MD 07/25/2018 8:20:59 AM This report has been signed electronically. Number of Addenda: 0

## 2018-07-25 NOTE — Anesthesia Preprocedure Evaluation (Signed)
Anesthesia Evaluation  Patient identified by MRN, date of birth, ID band Patient awake    Reviewed: Allergy & Precautions, NPO status , Patient's Chart, lab work & pertinent test results  Airway Mallampati: II  TM Distance: >3 FB Neck ROM: Full    Dental no notable dental hx.    Pulmonary neg pulmonary ROS,    Pulmonary exam normal breath sounds clear to auscultation       Cardiovascular hypertension, Pt. on medications Normal cardiovascular exam Rhythm:Regular Rate:Normal     Neuro/Psych negative neurological ROS  negative psych ROS   GI/Hepatic Neg liver ROS, GERD  ,  Endo/Other  negative endocrine ROS  Renal/GU negative Renal ROS  negative genitourinary   Musculoskeletal Lupus   Abdominal   Peds negative pediatric ROS (+)  Hematology negative hematology ROS (+)   Anesthesia Other Findings Syncope admission  Reproductive/Obstetrics negative OB ROS                             Anesthesia Physical Anesthesia Plan  ASA: II  Anesthesia Plan: MAC   Post-op Pain Management:    Induction:   PONV Risk Score and Plan: 2 and Ondansetron and Treatment may vary due to age or medical condition  Airway Management Planned: Nasal Cannula and Simple Face Mask  Additional Equipment:   Intra-op Plan:   Post-operative Plan:   Informed Consent: I have reviewed the patients History and Physical, chart, labs and discussed the procedure including the risks, benefits and alternatives for the proposed anesthesia with the patient or authorized representative who has indicated his/her understanding and acceptance.   Dental advisory given  Plan Discussed with: CRNA  Anesthesia Plan Comments:         Anesthesia Quick Evaluation

## 2018-07-25 NOTE — Anesthesia Postprocedure Evaluation (Signed)
Anesthesia Post Note  Patient: [name]  Procedure(s) Performed: ESOPHAGOGASTRODUODENOSCOPY (EGD) WITH PROPOFOL (N/A ) BIOPSY     Patient location during evaluation: Endoscopy Anesthesia Type: MAC Level of consciousness: awake and alert Pain management: pain level controlled Vital Signs Assessment: post-procedure vital signs reviewed and stable Respiratory status: spontaneous breathing, nonlabored ventilation, respiratory function stable and patient connected to nasal cannula oxygen Cardiovascular status: blood pressure returned to baseline and stable Postop Assessment: no apparent nausea or vomiting Anesthetic complications: no    Last Vitals:  Vitals:   07/25/18 0830 07/25/18 1213  BP: 113/73 (!) 167/78  Pulse: 69 67  Resp: 16 16  Temp:    SpO2: 100% 99%    Last Pain:  Vitals:   07/25/18 0830  TempSrc:   PainSc: 0-No pain                 Montez Hageman

## 2018-07-25 NOTE — Transfer of Care (Signed)
Immediate Anesthesia Transfer of Care Note  Patient: [name]  Procedure(s) Performed: ESOPHAGOGASTRODUODENOSCOPY (EGD) WITH PROPOFOL (N/A ) BIOPSY  Patient Location: PACU and Endoscopy Unit  Anesthesia Type:MAC  Level of Consciousness: awake and alert   Airway & Oxygen Therapy: Patient Spontanous Breathing and Patient connected to nasal cannula oxygen  Post-op Assessment: Report given to RN and Post -op Vital signs reviewed and stable  Post vital signs: Reviewed and stable  Last Vitals:  Vitals Value Taken Time  BP    Temp    Pulse    Resp    SpO2      Last Pain:  Vitals:   07/25/18 0751  TempSrc: Oral  PainSc: 0-No pain         Complications: No apparent anesthesia complications

## 2018-07-26 ENCOUNTER — Encounter (HOSPITAL_COMMUNITY): Payer: Self-pay | Admitting: Gastroenterology

## 2018-07-31 ENCOUNTER — Telehealth: Payer: Self-pay | Admitting: *Deleted

## 2018-07-31 DIAGNOSIS — I493 Ventricular premature depolarization: Secondary | ICD-10-CM

## 2018-07-31 DIAGNOSIS — I491 Atrial premature depolarization: Secondary | ICD-10-CM

## 2018-07-31 NOTE — Telephone Encounter (Signed)
Advised patient of holter and echo, verbalized understanding. Patient will come this week for TSH

## 2018-07-31 NOTE — Telephone Encounter (Signed)
-----   Message from Skeet Latch, MD sent at 07/27/2018  3:40 AM EST ----- Echo shows that her heart squeezes well but does not relax completely.  This is a mild change and will not cause symptoms unless it worsens.  It will be important to keep her blood pressure under good control.

## 2018-07-31 NOTE — Telephone Encounter (Signed)
-----   Message from Skeet Latch, MD sent at 07/31/2018  7:24 AM EST ----- Early beats from the top and bottom chambers of the heart noted.  No dangerous rhythms.  Thyroid function hasn't been checked lately.  Recommend checking TSH.

## 2018-08-02 LAB — TSH: TSH: 1.06 u[IU]/mL (ref 0.450–4.500)

## 2018-08-07 ENCOUNTER — Encounter: Payer: Self-pay | Admitting: Cardiology

## 2018-08-07 DIAGNOSIS — Z8249 Family history of ischemic heart disease and other diseases of the circulatory system: Secondary | ICD-10-CM | POA: Insufficient documentation

## 2018-08-07 DIAGNOSIS — Z853 Personal history of malignant neoplasm of breast: Secondary | ICD-10-CM | POA: Insufficient documentation

## 2018-08-08 ENCOUNTER — Encounter: Payer: Self-pay | Admitting: Cardiology

## 2018-08-08 ENCOUNTER — Ambulatory Visit: Payer: Medicare HMO | Admitting: Cardiology

## 2018-08-08 VITALS — BP 147/77 | HR 66 | Resp 16 | Ht 66.0 in | Wt 214.0 lb

## 2018-08-08 DIAGNOSIS — K2970 Gastritis, unspecified, without bleeding: Secondary | ICD-10-CM

## 2018-08-08 DIAGNOSIS — R55 Syncope and collapse: Secondary | ICD-10-CM | POA: Diagnosis not present

## 2018-08-08 DIAGNOSIS — Z8249 Family history of ischemic heart disease and other diseases of the circulatory system: Secondary | ICD-10-CM

## 2018-08-08 DIAGNOSIS — K2990 Gastroduodenitis, unspecified, without bleeding: Secondary | ICD-10-CM

## 2018-08-08 DIAGNOSIS — E669 Obesity, unspecified: Secondary | ICD-10-CM | POA: Diagnosis not present

## 2018-08-08 DIAGNOSIS — M329 Systemic lupus erythematosus, unspecified: Secondary | ICD-10-CM

## 2018-08-08 DIAGNOSIS — Z853 Personal history of malignant neoplasm of breast: Secondary | ICD-10-CM

## 2018-08-08 DIAGNOSIS — IMO0002 Reserved for concepts with insufficient information to code with codable children: Secondary | ICD-10-CM

## 2018-08-08 NOTE — Patient Instructions (Signed)
Medication Instructions:  No Changes. If you need a refill on your cardiac medications before your next appointment, please call your pharmacy.   Lab work: None Ordered. If you have labs (blood work) drawn today and your tests are completely normal, you will receive your results only by: Marland Kitchen MyChart Message (if you have MyChart) OR . A paper copy in the mail If you have any lab test that is abnormal or we need to change your treatment, we will call you to review the results.  Testing/Procedures: None Ordered.  Follow-Up: At [facility], you and your health needs are our priority.  As part of our continuing mission to provide you with exceptional heart care, we have created designated Provider Care Teams.  These Care Teams include your primary Cardiologist (physician) and Advanced Practice Providers (APPs -  Physician Assistants and Nurse Practitioners) who all work together to provide you with the care you need, when you need it. You will need a follow up appointment in 1 year.  Please call our office 2 months in advance to schedule this appointment.  You may see Skeet Latch, MD  or one of the following Advanced Practice Providers on your designated Care Team:   Kerin Ransom, PA-C Roby Lofts, [place] . Sande Rives, PA-C  Any Other Special Instructions Will Be Listed Below (If Applicable). Take BP at home, if top number (systolic) is consistently above 130 let your Primary Care Physician know.  Referral sent to Nutrition

## 2018-08-08 NOTE — Assessment & Plan Note (Signed)
Seen in ED 07/23/18- echo normal LVF, Holter PACs and PVCs

## 2018-08-08 NOTE — Assessment & Plan Note (Signed)
Father had an MI in his 50's 

## 2018-08-08 NOTE — Assessment & Plan Note (Signed)
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

## 2018-08-08 NOTE — Progress Notes (Signed)
08/08/2018 [name]   April 05, 1951  [id number]  Primary Physician Suella Broad, FNP Primary Cardiologist: Dr Oval Linsey  HPI: [name] is a pleasant 67 year old female seen in our office by Dr. Oval Linsey.  She had a prior nuclear study in 2012 that was negative for ischemia.  Dr. Oval Linsey saw her July 11, 2018 with palpitations.  Patient does have a family history of coronary disease, her father had an MI in his 70s.  Dr. Oval Linsey ordered a Holter monitor and echo.  Echo done 07/20/2018 showed EF of 55 to 60% with mild concentric LVH.  There was grade 1 diastolic dysfunction.  The patient then presented to the emergency room via EMS on 07/23/2018 after syncopal spell in church.  The patient had complained of abdominal pain.  In the emergency room her troponins were negative.  Her EKG showed sinus rhythm without acute changes.  Telemetry showed no arrhythmias.  Her d-dimer was elevated- VQ scan was negative.  CT scan of her abdomen without contrast was negative.  She ultimately underwent GI evaluation which showed gastritis.  She was placed on PPI.  She is seeing [name] now in follow-up.  Since discharge she is done well.  She has not had chest pain shortness of breath or recurrent syncope.  Her Holter monitor was unremarkable.   Current Outpatient Medications  Medication Sig Dispense Refill  . busPIRone (BUSPAR) 5 MG tablet Take 10 mg by mouth 3 (three) times daily.   3  . cholecalciferol (VITAMIN D) 1000 units tablet Take 1,000 Units by mouth daily.    . hydrochlorothiazide (HYDRODIURIL) 25 MG tablet Take 25 mg by mouth daily.     . metoprolol succinate (TOPROL-XL) 25 MG 24 hr tablet Take 2 tablets (50 mg total) by mouth every morning. (Patient taking differently: Take 50 mg by mouth daily. ) 60 tablet 1  . omeprazole (PRILOSEC) 40 MG capsule Take 1 capsule (40 mg total) by mouth daily. 30 capsule 5  . tamoxifen (NOLVADEX) 20 MG tablet Take 1 tablet (20 mg total) by mouth daily.  90 tablet 4   No current facility-administered medications for this visit.     Allergies  Allergen Reactions  . Iodine Anaphylaxis  . Iohexol Anaphylaxis     Code: HIVES, Desc: PT IS ALLERGIC TO IODINATED CONTRAST; REACTION INCLUDES FACIAL SWELLING,HIVES,RESPIRATORY DISTRESS;PT HASN'T HAD SCAN W/PREMEDS.;REFUSED CONTRAST OF ANY KIND!  KR   . Latex Anaphylaxis  . Penicillins Anaphylaxis and Rash    Has patient had a PCN reaction causing immediate rash, facial/tongue/throat swelling, SOB or lightheadedness with hypotension: Yes Has patient had a PCN reaction causing severe rash involving mucus membranes or skin necrosis: Yes Has patient had a PCN reaction that required hospitalization: No Has patient had a PCN reaction occurring within the last 10 years: No If all of the above answers are "NO", then may proceed with Cephalosporin use.   . Tetracycline Anaphylaxis    Past Medical History:  Diagnosis Date  . Arthritis    R knee, hands   . Blood transfusion    1986- post-partial hysterectomy  . Breast cancer [facility]) 2009   right lumpectomy and radiation  . Chronic kidney disease   . Colon cancer [facility]) 2003   per pt report, cancer found in a colon polyp in 2003, procedure done in [place].  did not require surgery, chemo etc.    . Family history of breast cancer 10/24/2017  . Gastritis   . GERD (gastroesophageal reflux disease)  recently taken off anti reflux tx  . Heart murmur    functional murmur, echo in [place]- long time ago   . Hepatitis    Hep. A- 1978  . Hiatal hernia   . Hypertension   . Lupus ([place])   . Personal history of radiation therapy 2009  . Renal disorder   . Syncope and collapse     Social History   Socioeconomic History  . Marital status: Married    Spouse name: Not on file  . Number of children: Not on file  . Years of education: Not on file  . Highest education level: Not on file  Occupational History  . Not on file  Social Needs  . Financial resource  strain: Not on file  . Food insecurity:    Worry: Not on file    Inability: Not on file  . Transportation needs:    Medical: Not on file    Non-medical: Not on file  Tobacco Use  . Smoking status: Never Smoker  . Smokeless tobacco: Never Used  Substance and Sexual Activity  . Alcohol use: No  . Drug use: No  . Sexual activity: Yes  Lifestyle  . Physical activity:    Days per week: Not on file    Minutes per session: Not on file  . Stress: Not on file  Relationships  . Social connections:    Talks on phone: Not on file    Gets together: Not on file    Attends religious service: Not on file    Active member of club or organization: Not on file    Attends meetings of clubs or organizations: Not on file    Relationship status: Not on file  . Intimate partner violence:    Fear of current or ex partner: Not on file    Emotionally abused: Not on file    Physically abused: Not on file    Forced sexual activity: Not on file  Other Topics Concern  . Not on file  Social History Narrative   ** Merged History Encounter **         Family History  Problem Relation Age of Onset  . Heart attack Father   . Cancer Other   . Hypertension Other   . Breast cancer Mother 10  . Hypertension Mother   . Hypertension Brother   . Kidney disease Brother   . Breast cancer Maternal Aunt 20  . Cancer Paternal Aunt 16       type unk  . Other Maternal Grandmother 102       brain tumor dx at 93- did not bx or do additional testing on tumor  . Cancer Paternal Grandmother 73       type unk  . Lung cancer Paternal Grandmother   . Anesthesia problems Neg Hx   . Hypotension Neg Hx   . Malignant hyperthermia Neg Hx   . Pseudochol deficiency Neg Hx   . Colon cancer Neg Hx   . Esophageal cancer Neg Hx   . Stomach cancer Neg Hx   . Pancreatic cancer Neg Hx   . Liver disease Neg Hx      Review of Systems: General: negative for chills, fever, night sweats or weight changes.  Cardiovascular:  negative for chest pain, dyspnea on exertion, edema, orthopnea, palpitations, paroxysmal nocturnal dyspnea or shortness of breath Dermatological: negative for rash Respiratory: negative for cough or wheezing Urologic: negative for hematuria Abdominal: negative for nausea, vomiting, diarrhea, bright red  blood per rectum, melena, or hematemesis Neurologic: negative for visual changes, syncope, or dizziness All other systems reviewed and are otherwise negative except as noted above.    Blood pressure (!) 147/77, pulse 66, resp. rate 16, height 5\' 6"  (1.676 m), weight 214 lb (97.1 kg), SpO2 99 %.  General appearance: alert, cooperative, no distress and mildly obese Neck: no JVD Lungs: clear to auscultation bilaterally Heart: regular rate and rhythm Extremities: no edema Skin: warm and dry Neurologic: Grossly normal   ASSESSMENT AND PLAN:   Syncope and collapse Seen in ED 07/23/18- echo normal LVF, Holter PACs and PVCs  Gastritis and gastroduodenitis Endoscopy 07/25/18  Family history of coronary artery disease in father Father had an MI in his 59's  History of breast cancer Biopsy and Tamoxifen 1998, recurrent in Rt breast 2009- treated with Tamoxifen and radiation   History of lupus ([place]) .   PLAN I suggested Ms. Demarcus keep an eye on her blood pressure.  If her systolic blood pressure is consistently greater than 130 she may need further medication adjustment for hypertension.  She tells me her blood pressure at home usually runs 120/70.  She will let us know if she has any further syncope chest pain or other symptoms.  She asked about going to a nutrition clinic at [facility] and I did tell her that we have that available here and she is interested.  I suggested she follow-up with Dr. Oval Linsey in a year and her PCP in 3 months.  Kerin Ransom PA-C 08/08/2018 2:29 PM

## 2018-08-08 NOTE — Assessment & Plan Note (Signed)
Endoscopy 07/25/18

## 2018-08-28 ENCOUNTER — Telehealth: Payer: Self-pay | Admitting: Adult Health

## 2018-08-28 NOTE — Telephone Encounter (Signed)
R/s appt per 12/9 sch message - pt is aware of appt date and time   

## 2018-08-28 NOTE — Progress Notes (Deleted)
[place]  [phone] Fax:(336) [phone]     ID: [name] DOB: June 04, 1951  MR#: [id number]  CSN#:[id number]  Patient Care Team: Suella Broad, FNP as PCP - General (Nurse Practitioner) Skeet Latch, MD as PCP - Cardiology (Cardiology) Gavin Pound, MD as Consulting Physician (Rheumatology) Willia Craze, NP as Nurse Practitioner (Gastroenterology) PCP: Suella Broad, FNP GYN: SU:  OTHER MD: Arloa Koh MD  CHIEF COMPLAINT: Estrogen receptor positive breast cancer  CURRENT TREATMENT: tamoxifen    BREAST CANCER HISTORY: From Dr. Dana Allan earlier summary:  "#1 patient is status post lumpectomy with sentinel lymph node biopsy followed by radiation to the right breast. In December 2009 patient began aromatase inhibitor but could not tolerate it.  #2 therefore she began in March 2010 tamoxifen 20 mg daily. However she has experienced significant amount of aches and pains and in December 2012 discontinued it. At this time she does not want to go back on any kind of antiestrogen therapy due to side effects.  #3 patient would like to go back on tamoxifen 20 mg daily. She recently attended finding your new normal seminar. And because of that she wants to she would like to go back on the tamoxifen. We discussed side effects again. She was sent to her pharmacy."  Her subsequent history is as detailed below  INTERVAL HISTORY: [name] returns today for follow-up of her estrogen receptor positive breast cancer. She continues on tamoxifen, with good tolerance. She has regular hot flashes, but they have improved in intensity over time. She has some increased vaginal discharge, but this is not a major issue for her.   REVIEW OF SYSTEMS: [name]    PAST MEDICAL HISTORY: Past Medical History:  Diagnosis Date  . Arthritis    R knee, hands   . Blood transfusion    1986- post-partial hysterectomy  . Breast cancer  [facility]) 2009   right lumpectomy and radiation  . Chronic kidney disease   . Colon cancer [facility]) 2003   per pt report, cancer found in a colon polyp in 2003, procedure done in [place].  did not require surgery, chemo etc.    . Family history of breast cancer 10/24/2017  . Gastritis   . GERD (gastroesophageal reflux disease)    recently taken off anti reflux tx  . Heart murmur    functional murmur, echo in [place]- long time ago   . Hepatitis    Hep. A- 1978  . Hiatal hernia   . Hypertension   . Lupus ([place])   . Personal history of radiation therapy 2009  . Renal disorder   . Syncope and collapse     PAST SURGICAL HISTORY: Past Surgical History:  Procedure Laterality Date  . ABDOMINAL HYSTERECTOMY     1986  . APPENDECTOMY     1978 & tubal ligation   . BIOPSY  07/25/2018   Procedure: BIOPSY;  Surgeon: Milus Banister, MD;  Location: [facility] ENDOSCOPY;  Service: Endoscopy;;  . BREAST EXCISIONAL BIOPSY Left 1998  . BREAST SURGERY     R breast lumpectomy  . CHOLECYSTECTOMY     1978- open   . ESOPHAGOGASTRODUODENOSCOPY (EGD) WITH PROPOFOL N/A 07/25/2018   Procedure: ESOPHAGOGASTRODUODENOSCOPY (EGD) WITH PROPOFOL;  Surgeon: Milus Banister, MD;  Location: [facility] ENDOSCOPY;  Service: Endoscopy;  Laterality: N/A;  . REPLACEMENT TOTAL KNEE Left   . TONSILLECTOMY     1954  . TOTAL KNEE ARTHROPLASTY  08/30/2011   Procedure: TOTAL KNEE ARTHROPLASTY;  Surgeon: Kerin Salen;  Location: [facility];  Service: Orthopedics;  Laterality: Right;  Right Total Knee Arthroplasty  . TUBAL LIGATION      FAMILY HISTORY Family History  Problem Relation Age of Onset  . Heart attack Father   . Cancer Other   . Hypertension Other   . Breast cancer Mother 63  . Hypertension Mother   . Hypertension Brother   . Kidney disease Brother   . Breast cancer Maternal Aunt 27  . Cancer Paternal Aunt 65       type unk  . Other Maternal Grandmother 102       brain tumor dx at 7- did not bx or do additional testing on tumor  .  Cancer Paternal Grandmother 49       type unk  . Lung cancer Paternal Grandmother   . Anesthesia problems Neg Hx   . Hypotension Neg Hx   . Malignant hyperthermia Neg Hx   . Pseudochol deficiency Neg Hx   . Colon cancer Neg Hx   . Esophageal cancer Neg Hx   . Stomach cancer Neg Hx   . Pancreatic cancer Neg Hx   . Liver disease Neg Hx    the patient's father died from a heart attack at the age of 110. The patient's mother died at the age of 64. She had a history of breast cancer and one of her sisters was diagnosed with breast cancer at the age of 72. The patient had 5 brothers and 5 sisters. There is no other history of breast or ovarian cancer in the family to her knowledge.  GYNECOLOGIC HISTORY:  No LMP recorded. Patient has had a hysterectomy. Menarche age 1, first live birth age 88. The patient is GX P3. She had a simple hysterectomy in 1986 (no salpingo-oophorectomy.  SOCIAL HISTORY:  [name] is 1 of our [employer] volunteers.  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 She remarried in 2011. Her husband Laveda Abbe is retired from [employer]. The patient has one adopted son in addition to her own children. 3 of her children live in town 1 in [place]. She has 6 grandchildren and one on the way grandchildren. She attends [employer] locally---She is one of our [employer] volunteers    ADVANCED DIRECTIVES: Not in place   HEALTH MAINTENANCE: Social History   Tobacco Use  . Smoking status: Never Smoker  . Smokeless tobacco: Never Used  Substance Use Topics  . Alcohol use: No  . Drug use: No     Colonoscopy:  PAP:  Bone density: 04/30/2008 was normal  Lipid panel:  Allergies  Allergen Reactions  . Iodine Anaphylaxis  . Iohexol Anaphylaxis     Code: HIVES, Desc: PT IS ALLERGIC TO IODINATED CONTRAST; REACTION INCLUDES FACIAL SWELLING,HIVES,RESPIRATORY DISTRESS;PT HASN'T HAD SCAN W/PREMEDS.;REFUSED CONTRAST OF ANY KIND!  KR   .  Latex Anaphylaxis  . Penicillins Anaphylaxis and Rash    Has patient had a PCN reaction causing immediate rash, facial/tongue/throat swelling, SOB or lightheadedness with hypotension: Yes Has patient had a PCN reaction causing severe rash involving mucus membranes or skin necrosis: Yes Has patient had a PCN reaction that required hospitalization: No Has patient had a PCN reaction occurring within the last 10 years: No If all of the above answers are "NO", then may proceed with Cephalosporin use.   . Tetracycline Anaphylaxis    Current Outpatient Medications  Medication Sig Dispense  Refill  . busPIRone (BUSPAR) 5 MG tablet Take 10 mg by mouth 3 (three) times daily.   3  . cholecalciferol (VITAMIN D) 1000 units tablet Take 1,000 Units by mouth daily.    . hydrochlorothiazide (HYDRODIURIL) 25 MG tablet Take 25 mg by mouth daily.     . metoprolol succinate (TOPROL-XL) 25 MG 24 hr tablet Take 2 tablets (50 mg total) by mouth every morning. (Patient taking differently: Take 50 mg by mouth daily. ) 60 tablet 1  . omeprazole (PRILOSEC) 40 MG capsule Take 1 capsule (40 mg total) by mouth daily. 30 capsule 5  . tamoxifen (NOLVADEX) 20 MG tablet Take 1 tablet (20 mg total) by mouth daily. 90 tablet 4   No current facility-administered medications for this visit.     OBJECTIVE:  There were no vitals filed for this visit.   There is no height or weight on file to calculate BMI.    ECOG FS:1 - Symptomatic but completely ambulatory  GENERAL: Patient is a well appearing female in no acute distress HEENT:  Sclerae anicteric.  Oropharynx clear and moist. No ulcerations or evidence of oropharyngeal candidiasis. Neck is supple.  NODES:  No cervical, supraclavicular, or axillary lymphadenopathy palpated.  BREAST EXAM:  Deferred. LUNGS:  Clear to auscultation bilaterally.  No wheezes or rhonchi. HEART:  Regular rate and rhythm. No murmur appreciated. ABDOMEN:  Soft, nontender.  Positive, normoactive bowel  sounds. No organomegaly palpated. MSK:  No focal spinal tenderness to palpation. Full range of motion bilaterally in the upper extremities. EXTREMITIES:  No peripheral edema.   SKIN:  Clear with no obvious rashes or skin changes. No nail dyscrasia. NEURO:  Nonfocal. Well oriented.  Appropriate affect.    LAB RESULTS:  CMP     Component Value Date/Time   NA 140 07/24/2018 0851   NA 141 01/26/2017 1508   K 4.1 07/24/2018 0851   K 3.4 (L) 01/26/2017 1508   CL 109 07/24/2018 0851   CL 102 01/25/2013 0909   CO2 25 07/24/2018 0851   CO2 29 01/26/2017 1508   GLUCOSE 86 07/24/2018 0851   GLUCOSE 91 01/26/2017 1508   GLUCOSE 81 01/25/2013 0909   BUN 8 07/24/2018 0851   BUN 11.5 01/26/2017 1508   CREATININE 0.82 07/24/2018 0851   CREATININE 0.81 02/17/2018 0924   CREATININE 0.8 01/26/2017 1508   CALCIUM 8.9 07/24/2018 0851   CALCIUM 9.5 01/26/2017 1508   PROT 6.2 (L) 07/24/2018 0851   PROT 7.1 01/26/2017 1508   ALBUMIN 3.0 (L) 07/24/2018 0851   ALBUMIN 3.5 01/26/2017 1508   AST 43 (H) 07/24/2018 0851   AST 21 02/17/2018 0924   AST 22 01/26/2017 1508   ALT 26 07/24/2018 0851   ALT 15 02/17/2018 0924   ALT 17 01/26/2017 1508   ALKPHOS 63 07/24/2018 0851   ALKPHOS 72 01/26/2017 1508   BILITOT 0.7 07/24/2018 0851   BILITOT <0.2 (L) 02/17/2018 0924   BILITOT 0.38 01/26/2017 1508   GFRNONAA >60 07/24/2018 0851   GFRNONAA >60 02/17/2018 0924   GFRAA >60 07/24/2018 0851   GFRAA >60 02/17/2018 0924    INo results found for: SPEP, UPEP  Lab Results  Component Value Date   WBC 3.8 (L) 07/24/2018   NEUTROABS 2.0 07/23/2018   HGB 11.1 (L) 07/24/2018   HCT 34.8 (L) 07/24/2018   MCV 93.8 07/24/2018   PLT 175 07/24/2018      Chemistry      Component Value Date/Time   NA  140 07/24/2018 0851   NA 141 01/26/2017 1508   K 4.1 07/24/2018 0851   K 3.4 (L) 01/26/2017 1508   CL 109 07/24/2018 0851   CL 102 01/25/2013 0909   CO2 25 07/24/2018 0851   CO2 29 01/26/2017 1508    BUN 8 07/24/2018 0851   BUN 11.5 01/26/2017 1508   CREATININE 0.82 07/24/2018 0851   CREATININE 0.81 02/17/2018 0924   CREATININE 0.8 01/26/2017 1508      Component Value Date/Time   CALCIUM 8.9 07/24/2018 0851   CALCIUM 9.5 01/26/2017 1508   ALKPHOS 63 07/24/2018 0851   ALKPHOS 72 01/26/2017 1508   AST 43 (H) 07/24/2018 0851   AST 21 02/17/2018 0924   AST 22 01/26/2017 1508   ALT 26 07/24/2018 0851   ALT 15 02/17/2018 0924   ALT 17 01/26/2017 1508   BILITOT 0.7 07/24/2018 0851   BILITOT <0.2 (L) 02/17/2018 0924   BILITOT 0.38 01/26/2017 1508       Lab Results  Component Value Date   LABCA2 16 11/09/2010    No components found for: [id number]  No results for input(s): INR in the last 168 hours.  Urinalysis    Component Value Date/Time   COLORURINE YELLOW 10/07/2014 [place] 10/07/2014 1605   LABSPEC 1.015 01/26/2017 1523   PHURINE 7.0 01/26/2017 1523   PHURINE 6.0 10/07/2014 1605   GLUCOSEU Negative 01/26/2017 1523   HGBUR Trace 01/26/2017 1523   HGBUR TRACE (A) 10/07/2014 1605   HGBUR negative 01/22/2010 1551   BILIRUBINUR Negative 01/26/2017 1523   KETONESUR Negative 01/26/2017 1523   KETONESUR NEGATIVE 10/07/2014 1605   PROTEINUR < 30 01/26/2017 1523   PROTEINUR NEGATIVE 10/07/2014 1605   UROBILINOGEN 0.2 01/26/2017 1523   NITRITE Negative 01/26/2017 1523   NITRITE NEGATIVE 10/07/2014 1605   LEUKOCYTESUR Small 01/26/2017 1523    STUDIES: Since her last visit, she underwent routine screening bilateral mammography with CAD and tomography on 06/27/2017 at [place] showing: breast density category C. There was no evidence of malignancy.   She also completed a chest xray on 09/27/2017 for chest pain and dyspnea showing: No active cardiopulmonary disease.   ASSESSMENT: 67 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  (2) status post right lumpectomy 03/20/2008 for invasive lobular carcinoma, multifocal, pT1a pNX, stage IA, grade 2, strongly estrogen and progesterone receptor positive, HER-2 negative. with negative margins.  (3) status post right axillary sentinel lymph node sampling 04/17/2008, the single sentinel lymph node being clear  (4) Oncotype score of 10 predicts an outside the breast risk of recurrence of 7% if the patient's only systemic therapy is tamoxifen for 5 years.  (5) Additional right breast biopsy 05/23/2008 to evaluate microcalcifications seen on pre-radiotherapy mammography showed only atypical lobular hyperplasia  (6) Completed radiation to the right breast 07/26/2008 (50 gray +14 gray boost).  (7) Started on anastrozole November 2009, with poor tolerance  (8). Started on tamoxifen March 2010, continued to December 2012, resumed December 2014   (a)  The plan is to continue tamoxifen  (9) status post simple hysterectomy 1986 (no salpingo-oophorectomy)  (10) genetics testing 10/31/2017 through the Common Hereditary Cancers Panel offered by [facility] found no deleterious mutations in APC, ATM, AXIN2, BARD1, BMPR1A, BRCA1, BRCA2, BRIP1, CDH1, CDKN2A (p14ARF), CDKN2A (p16INK4a), CKD4, CHEK2, CTNNA1, DICER1, EPCAM (Deletion/duplication testing only), GREM1 (promoter  region deletion/duplication testing only), KIT, MEN1, MLH1, MSH2, MSH3, MSH6, MUTYH, NBN, NF1, NHTL1, PALB2, PDGFRA, PMS2, POLD1, POLE, PTEN, RAD50, RAD51C, RAD51D, SDHB, SDHC, SDHD, SMAD4, SMARCA4. STK11, TP53, TSC1, TSC2, and VHL.  The following genes were evaluated for sequence changes only: SDHA and HOXB13 c.251G>A variant only.  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    PLAN: [name]  She knows to call for any other issues that may develop before the next visit.    Wilber Bihari, NP  08/28/18 3:34 PM Medical Oncology and  Hematology [facility] [address] [place], [place] [address] Tel. [phone]    Fax. [phone]

## 2018-08-29 ENCOUNTER — Inpatient Hospital Stay: Payer: Medicare HMO | Admitting: Adult Health

## 2018-09-21 ENCOUNTER — Telehealth: Payer: Self-pay | Admitting: Adult Health

## 2018-09-21 NOTE — Telephone Encounter (Signed)
Patient came in to reschedule  °

## 2018-09-25 ENCOUNTER — Ambulatory Visit: Payer: Medicare HMO | Admitting: Adult Health

## 2018-11-01 ENCOUNTER — Inpatient Hospital Stay: Payer: Medicare HMO

## 2018-11-01 ENCOUNTER — Other Ambulatory Visit: Payer: Self-pay | Admitting: Adult Health

## 2018-11-01 ENCOUNTER — Other Ambulatory Visit: Payer: Self-pay

## 2018-11-01 ENCOUNTER — Inpatient Hospital Stay: Payer: Medicare HMO | Attending: Adult Health | Admitting: Adult Health

## 2018-11-01 ENCOUNTER — Ambulatory Visit: Payer: Self-pay

## 2018-11-01 ENCOUNTER — Encounter: Payer: Self-pay | Admitting: Adult Health

## 2018-11-01 VITALS — BP 124/73 | HR 74 | Temp 98.9°F | Resp 18 | Ht 66.0 in | Wt 209.3 lb

## 2018-11-01 DIAGNOSIS — R634 Abnormal weight loss: Secondary | ICD-10-CM | POA: Diagnosis not present

## 2018-11-01 DIAGNOSIS — Z923 Personal history of irradiation: Secondary | ICD-10-CM | POA: Diagnosis not present

## 2018-11-01 DIAGNOSIS — Z79899 Other long term (current) drug therapy: Secondary | ICD-10-CM | POA: Diagnosis not present

## 2018-11-01 DIAGNOSIS — Z170 Estrogen receptor positive status [ER+]: Secondary | ICD-10-CM

## 2018-11-01 DIAGNOSIS — R300 Dysuria: Secondary | ICD-10-CM

## 2018-11-01 DIAGNOSIS — Z1239 Encounter for other screening for malignant neoplasm of breast: Secondary | ICD-10-CM

## 2018-11-01 DIAGNOSIS — N390 Urinary tract infection, site not specified: Secondary | ICD-10-CM

## 2018-11-01 DIAGNOSIS — Z803 Family history of malignant neoplasm of breast: Secondary | ICD-10-CM

## 2018-11-01 DIAGNOSIS — C50411 Malignant neoplasm of upper-outer quadrant of right female breast: Secondary | ICD-10-CM | POA: Diagnosis present

## 2018-11-01 DIAGNOSIS — Z79810 Long term (current) use of selective estrogen receptor modulators (SERMs): Secondary | ICD-10-CM

## 2018-11-01 DIAGNOSIS — Z8249 Family history of ischemic heart disease and other diseases of the circulatory system: Secondary | ICD-10-CM | POA: Insufficient documentation

## 2018-11-01 LAB — CBC WITH DIFFERENTIAL (CANCER CENTER ONLY)
Abs Immature Granulocytes: 0.01 10*3/uL (ref 0.00–0.07)
Basophils Absolute: 0 10*3/uL (ref 0.0–0.1)
Basophils Relative: 1 %
Eosinophils Absolute: 0.1 10*3/uL (ref 0.0–0.5)
Eosinophils Relative: 2 %
HCT: 38.4 % (ref 36.0–46.0)
Hemoglobin: 12.4 g/dL (ref 12.0–15.0)
Immature Granulocytes: 0 %
Lymphocytes Relative: 48 %
Lymphs Abs: 2.1 10*3/uL (ref 0.7–4.0)
MCH: 30.2 pg (ref 26.0–34.0)
MCHC: 32.3 g/dL (ref 30.0–36.0)
MCV: 93.7 fL (ref 80.0–100.0)
Monocytes Absolute: 0.6 10*3/uL (ref 0.1–1.0)
Monocytes Relative: 14 %
Neutro Abs: 1.6 10*3/uL — ABNORMAL LOW (ref 1.7–7.7)
Neutrophils Relative %: 35 %
Platelet Count: 182 10*3/uL (ref 150–400)
RBC: 4.1 MIL/uL (ref 3.87–5.11)
RDW: 12.8 % (ref 11.5–15.5)
WBC Count: 4.4 10*3/uL (ref 4.0–10.5)
nRBC: 0 % (ref 0.0–0.2)

## 2018-11-01 LAB — URINALYSIS, COMPLETE (UACMP) WITH MICROSCOPIC
Bilirubin Urine: NEGATIVE
Glucose, UA: NEGATIVE mg/dL
Ketones, ur: NEGATIVE mg/dL
Nitrite: NEGATIVE
Protein, ur: NEGATIVE mg/dL
Specific Gravity, Urine: 1.017 (ref 1.005–1.030)
pH: 5 (ref 5.0–8.0)

## 2018-11-01 LAB — CMP (CANCER CENTER ONLY)
ALT: 16 U/L (ref 0–44)
AST: 25 U/L (ref 15–41)
Albumin: 3.6 g/dL (ref 3.5–5.0)
Alkaline Phosphatase: 85 U/L (ref 38–126)
Anion gap: 9 (ref 5–15)
BUN: 14 mg/dL (ref 8–23)
CO2: 27 mmol/L (ref 22–32)
Calcium: 9.4 mg/dL (ref 8.9–10.3)
Chloride: 107 mmol/L (ref 98–111)
Creatinine: 0.94 mg/dL (ref 0.44–1.00)
GFR, Est AFR Am: >60 mL/min (ref >60)
GFR, Estimated: >60 mL/min (ref >60)
Glucose, Bld: 93 mg/dL (ref 70–99)
Potassium: 3.8 mmol/L (ref 3.5–5.1)
Sodium: 143 mmol/L (ref 135–145)
Total Bilirubin: 0.4 mg/dL (ref 0.3–1.2)
Total Protein: 7.1 g/dL (ref 6.5–8.1)

## 2018-11-01 MED ORDER — FLUCONAZOLE 150 MG PO TABS: 150.0000 mg | ORAL_TABLET | Freq: Every day | ORAL | 0 refills | Status: DC

## 2018-11-01 MED ORDER — CIPROFLOXACIN HCL 250 MG PO TABS: 250.0000 mg | ORAL_TABLET | Freq: Two times a day (BID) | ORAL | 0 refills | Status: DC

## 2018-11-01 NOTE — Progress Notes (Signed)
[place]  [phone] [phone]) [phone]     ID: [name] DOB: 03-18-1951  MR#: [id number]  MLJ#:[id number]  Patient Care Team: Suella Broad, FNP as PCP - General (Nurse Practitioner) Skeet Latch, MD as PCP - Cardiology (Cardiology) Gavin Pound, MD as Consulting Physician (Rheumatology) Willia Craze, NP as Nurse Practitioner (Gastroenterology) PCP: Suella Broad, FNP GYN: SU:  OTHER MD: Arloa Koh MD  CHIEF COMPLAINT: Estrogen receptor positive breast cancer  CURRENT TREATMENT: tamoxifen    BREAST CANCER HISTORY: From Dr. Dana Allan earlier summary:  "#1 patient is status post lumpectomy with sentinel lymph node biopsy followed by radiation to the right breast. In December 2009 patient began aromatase inhibitor but could not tolerate it.  #2 therefore she began in March 2010 tamoxifen 20 mg daily. However she has experienced significant amount of aches and pains and in December 2012 discontinued it. At this time she does not want to go back on any kind of antiestrogen therapy due to side effects.  #3 patient would like to go back on tamoxifen 20 mg daily. She recently attended finding your new normal seminar. And because of that she wants to she would like to go back on the tamoxifen. We discussed side effects again. She was sent to her pharmacy."  Her subsequent history is as detailed below  INTERVAL HISTORY: [name] returns today for follow-up of her estrogen receptor positive breast cancer. She continues on tamoxifen, with good tolerance.  Since her last visit here she underwent CT abdomen/pelvis on 07/23/2018 that shows no acute findings of the abdomen, hiatal hernia, and diverticular disease without evidence of acute diverticulitis.  I cannot locate a recent mammogram since her mammogram in 06/2017.  She has not had one since then.  She has not had a pelvic exam.  She is s/p TAH, but does have her  ovaries and would like those checked out.   REVIEW OF SYSTEMS: [name] is doing well today.  She has started exercising regularly and eating better and is losing weight and feeling better.  She denies any fevers, chills, headaches or vision changes.  She is without nausea, vomiting, bowel/bladder changes.  She hasn't noted chest pain, cough, shortness of breath, or any other concerns.  A detailed ROS was otherwise non contributory.     PAST MEDICAL HISTORY: Past Medical History:  Diagnosis Date  . Arthritis    R knee, hands   . Blood transfusion    1986- post-partial hysterectomy  . Breast cancer [facility]) 2009   right lumpectomy and radiation  . Chronic kidney disease   . Colon cancer [facility]) 2003   per pt report, cancer found in a colon polyp in 2003, procedure done in [place].  did not require surgery, chemo etc.    . Family history of breast cancer 10/24/2017  . Gastritis   . GERD (gastroesophageal reflux disease)    recently taken off anti reflux tx  . Heart murmur    functional murmur, echo in [place]- long time ago   . Hepatitis    Hep. A- 1978  . Hiatal hernia   . Hypertension   . Lupus ([place])   . Personal history of radiation therapy 2009  . Renal disorder   . Syncope and collapse     PAST SURGICAL HISTORY: Past Surgical History:  Procedure Laterality Date  . ABDOMINAL HYSTERECTOMY     1986  . APPENDECTOMY     1978 & tubal ligation   .  BIOPSY  07/25/2018   Procedure: BIOPSY;  Surgeon: Milus Banister, MD;  Location: [facility] ENDOSCOPY;  Service: Endoscopy;;  . BREAST EXCISIONAL BIOPSY Left 1998  . BREAST SURGERY     R breast lumpectomy  . CHOLECYSTECTOMY     1978- open   . ESOPHAGOGASTRODUODENOSCOPY (EGD) WITH PROPOFOL N/A 07/25/2018   Procedure: ESOPHAGOGASTRODUODENOSCOPY (EGD) WITH PROPOFOL;  Surgeon: Milus Banister, MD;  Location: [facility] ENDOSCOPY;  Service: Endoscopy;  Laterality: N/A;  . REPLACEMENT TOTAL KNEE Left   . TONSILLECTOMY     1954  . TOTAL KNEE ARTHROPLASTY   08/30/2011   Procedure: TOTAL KNEE ARTHROPLASTY;  Surgeon: Kerin Salen;  Location: [facility];  Service: Orthopedics;  Laterality: Right;  Right Total Knee Arthroplasty  . TUBAL LIGATION      FAMILY HISTORY Family History  Problem Relation Age of Onset  . Heart attack Father   . Cancer Other   . Hypertension Other   . Breast cancer Mother 53  . Hypertension Mother   . Hypertension Brother   . Kidney disease Brother   . Breast cancer Maternal Aunt 33  . Cancer Paternal Aunt 93       type unk  . Other Maternal Grandmother 102       brain tumor dx at 110- did not bx or do additional testing on tumor  . Cancer Paternal Grandmother 83       type unk  . Lung cancer Paternal Grandmother   . Anesthesia problems Neg Hx   . Hypotension Neg Hx   . Malignant hyperthermia Neg Hx   . Pseudochol deficiency Neg Hx   . Colon cancer Neg Hx   . Esophageal cancer Neg Hx   . Stomach cancer Neg Hx   . Pancreatic cancer Neg Hx   . Liver disease Neg Hx    the patient's father died from a heart attack at the age of 55. The patient's mother died at the age of 29. She had a history of breast cancer and one of her sisters was diagnosed with breast cancer at the age of 54. The patient had 5 brothers and 5 sisters. There is no other history of breast or ovarian cancer in the family to her knowledge.  GYNECOLOGIC HISTORY:  No LMP recorded. Patient has had a hysterectomy. Menarche age 21, first live birth age 34. The patient is GX P3. She had a simple hysterectomy in 1986 (no salpingo-oophorectomy.  SOCIAL HISTORY:  [name] is 1 of our [employer] volunteers.  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 She remarried in 2011. Her husband Laveda Abbe is retired from [employer]. The patient has one adopted son in addition to her own children. 3 of her children live in town 1 in [place]. She has 6 grandchildren and one on the way grandchildren. She attends [employer]  locally---She is one of our [employer] volunteers    ADVANCED DIRECTIVES: Not in place   HEALTH MAINTENANCE: Social History   Tobacco Use  . Smoking status: Never Smoker  . Smokeless tobacco: Never Used  Substance Use Topics  . Alcohol use: No  . Drug use: No     Colonoscopy:  PAP:  Bone density: 04/30/2008 was normal  Lipid panel:  Allergies  Allergen Reactions  . Iodine Anaphylaxis  . Iohexol Anaphylaxis     Code: HIVES, Desc: PT IS ALLERGIC TO IODINATED CONTRAST; REACTION INCLUDES FACIAL SWELLING,HIVES,RESPIRATORY DISTRESS;PT HASN'T  HAD SCAN W/PREMEDS.;REFUSED CONTRAST OF ANY KIND!  KR   . Latex Anaphylaxis  . Penicillins Anaphylaxis and Rash    Has patient had a PCN reaction causing immediate rash, facial/tongue/throat swelling, SOB or lightheadedness with hypotension: Yes Has patient had a PCN reaction causing severe rash involving mucus membranes or skin necrosis: Yes Has patient had a PCN reaction that required hospitalization: No Has patient had a PCN reaction occurring within the last 10 years: No If all of the above answers are "NO", then may proceed with Cephalosporin use.   . Tetracycline Anaphylaxis    Current Outpatient Medications  Medication Sig Dispense Refill  . busPIRone (BUSPAR) 5 MG tablet Take 10 mg by mouth 3 (three) times daily.   3  . cholecalciferol (VITAMIN D) 1000 units tablet Take 1,000 Units by mouth daily.    . hydrochlorothiazide (HYDRODIURIL) 25 MG tablet Take 25 mg by mouth daily.     . metoprolol succinate (TOPROL-XL) 25 MG 24 hr tablet Take 2 tablets (50 mg total) by mouth every morning. (Patient taking differently: Take 50 mg by mouth daily. ) 60 tablet 1  . omeprazole (PRILOSEC) 40 MG capsule Take 1 capsule (40 mg total) by mouth daily. 30 capsule 5  . tamoxifen (NOLVADEX) 20 MG tablet Take 1 tablet (20 mg total) by mouth daily. 90 tablet 4   No current facility-administered medications for this visit.     OBJECTIVE:  Vitals:    11/01/18 1007  BP: 124/73  Pulse: 74  Resp: 18  Temp: 98.9 F (37.2 C)  SpO2: 100%     Body mass index is 33.78 kg/m.    ECOG FS:1 - Symptomatic but completely ambulatory GENERAL: Patient is a well appearing female in no acute distress HEENT:  Sclerae anicteric.  Oropharynx clear and moist. No ulcerations or evidence of oropharyngeal candidiasis. Neck is supple.  NODES:  No cervical, supraclavicular, or axillary lymphadenopathy palpated.  BREAST EXAM: right breast s/p lumpectomy, no sign of local recurrence, left breast benign LUNGS:  Clear to auscultation bilaterally.  No wheezes or rhonchi. HEART:  Regular rate and rhythm. No murmur appreciated. ABDOMEN:  Soft, nontender.  Positive, normoactive bowel sounds. No organomegaly palpated. MSK:  No focal spinal tenderness to palpation. Full range of motion bilaterally in the upper extremities. EXTREMITIES:  No peripheral edema.   SKIN:  Clear with no obvious rashes or skin changes. No nail dyscrasia. NEURO:  Nonfocal. Well oriented.  Appropriate affect.    LAB RESULTS:  CMP     Component Value Date/Time   NA 140 07/24/2018 0851   NA 141 01/26/2017 1508   K 4.1 07/24/2018 0851   K 3.4 (L) 01/26/2017 1508   CL 109 07/24/2018 0851   CL 102 01/25/2013 0909   CO2 25 07/24/2018 0851   CO2 29 01/26/2017 1508   GLUCOSE 86 07/24/2018 0851   GLUCOSE 91 01/26/2017 1508   GLUCOSE 81 01/25/2013 0909   BUN 8 07/24/2018 0851   BUN 11.5 01/26/2017 1508   CREATININE 0.82 07/24/2018 0851   CREATININE 0.81 02/17/2018 0924   CREATININE 0.8 01/26/2017 1508   CALCIUM 8.9 07/24/2018 0851   CALCIUM 9.5 01/26/2017 1508   PROT 6.2 (L) 07/24/2018 0851   PROT 7.1 01/26/2017 1508   ALBUMIN 3.0 (L) 07/24/2018 0851   ALBUMIN 3.5 01/26/2017 1508   AST 43 (H) 07/24/2018 0851   AST 21 02/17/2018 0924   AST 22 01/26/2017 1508   ALT 26 07/24/2018 0851   ALT 15  02/17/2018 0924   ALT 17 01/26/2017 1508   ALKPHOS 63 07/24/2018 0851   ALKPHOS 72  01/26/2017 1508   BILITOT 0.7 07/24/2018 0851   BILITOT <0.2 (L) 02/17/2018 0924   BILITOT 0.38 01/26/2017 1508   GFRNONAA >60 07/24/2018 0851   GFRNONAA >60 02/17/2018 0924   GFRAA >60 07/24/2018 0851   GFRAA >60 02/17/2018 0924    INo results found for: SPEP, UPEP  Lab Results  Component Value Date   WBC 3.8 (L) 07/24/2018   NEUTROABS 2.0 07/23/2018   HGB 11.1 (L) 07/24/2018   HCT 34.8 (L) 07/24/2018   MCV 93.8 07/24/2018   PLT 175 07/24/2018      Chemistry      Component Value Date/Time   NA 140 07/24/2018 0851   NA 141 01/26/2017 1508   K 4.1 07/24/2018 0851   K 3.4 (L) 01/26/2017 1508   CL 109 07/24/2018 0851   CL 102 01/25/2013 0909   CO2 25 07/24/2018 0851   CO2 29 01/26/2017 1508   BUN 8 07/24/2018 0851   BUN 11.5 01/26/2017 1508   CREATININE 0.82 07/24/2018 0851   CREATININE 0.81 02/17/2018 0924   CREATININE 0.8 01/26/2017 1508      Component Value Date/Time   CALCIUM 8.9 07/24/2018 0851   CALCIUM 9.5 01/26/2017 1508   ALKPHOS 63 07/24/2018 0851   ALKPHOS 72 01/26/2017 1508   AST 43 (H) 07/24/2018 0851   AST 21 02/17/2018 0924   AST 22 01/26/2017 1508   ALT 26 07/24/2018 0851   ALT 15 02/17/2018 0924   ALT 17 01/26/2017 1508   BILITOT 0.7 07/24/2018 0851   BILITOT <0.2 (L) 02/17/2018 0924   BILITOT 0.38 01/26/2017 1508       Lab Results  Component Value Date   LABCA2 16 11/09/2010    No components found for: [id number]  No results for input(s): INR in the last 168 hours.  Urinalysis    Component Value Date/Time   COLORURINE YELLOW 11/01/2018 1003   APPEARANCEUR HAZY (A) 11/01/2018 1003   LABSPEC 1.017 11/01/2018 1003   LABSPEC 1.015 01/26/2017 1523   PHURINE 5.0 11/01/2018 1003   GLUCOSEU NEGATIVE 11/01/2018 1003   GLUCOSEU Negative 01/26/2017 1523   HGBUR SMALL (A) 11/01/2018 1003   HGBUR negative 01/22/2010 1551   BILIRUBINUR NEGATIVE 11/01/2018 1003   BILIRUBINUR Negative 01/26/2017 1523   KETONESUR NEGATIVE 11/01/2018 1003    PROTEINUR NEGATIVE 11/01/2018 1003   UROBILINOGEN 0.2 01/26/2017 1523   NITRITE NEGATIVE 11/01/2018 1003   LEUKOCYTESUR SMALL (A) 11/01/2018 1003   LEUKOCYTESUR Small 01/26/2017 1523    STUDIES: Since her last visit, she underwent routine screening bilateral mammography with CAD and tomography on 06/27/2017 at [facility] showing: breast density category C. There was no evidence of malignancy.   She also completed a chest xray on 09/27/2017 for chest pain and dyspnea showing: No active cardiopulmonary disease.   ASSESSMENT: 68 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  (2) status post right lumpectomy 03/20/2008 for invasive lobular carcinoma, multifocal, pT1a pNX, stage IA, grade 2, strongly estrogen and progesterone receptor positive, HER-2 negative. with negative margins.  (3) status post right axillary sentinel lymph node sampling 04/17/2008, the single sentinel lymph node being clear  (4) Oncotype score of 10 predicts an outside the breast risk of recurrence of 7% if the patient's only systemic  therapy is tamoxifen for 5 years.  (5) Additional right breast biopsy 05/23/2008 to evaluate microcalcifications seen on pre-radiotherapy mammography showed only atypical lobular hyperplasia  (6) Completed radiation to the right breast 07/26/2008 (50 gray +14 gray boost).  (7) Started on anastrozole November 2009, with poor tolerance  (8). Started on tamoxifen March 2010, continued to December 2012, resumed December 2014   (a)  The plan is to continue tamoxifen through March 2022 (at least--she would like to take for the rest of her life)  (9) status post simple hysterectomy 1986 (no salpingo-oophorectomy)  (10) genetics testing 10/31/2017 through the Common Hereditary Cancers Panel offered by [facility] found no  deleterious mutations in APC, ATM, AXIN2, BARD1, BMPR1A, BRCA1, BRCA2, BRIP1, CDH1, CDKN2A (p14ARF), CDKN2A (p16INK4a), CKD4, CHEK2, CTNNA1, DICER1, EPCAM (Deletion/duplication testing only), GREM1 (promoter region deletion/duplication testing only), KIT, MEN1, MLH1, MSH2, MSH3, MSH6, MUTYH, NBN, NF1, NHTL1, PALB2, PDGFRA, PMS2, POLD1, POLE, PTEN, RAD50, RAD51C, RAD51D, SDHB, SDHC, SDHD, SMAD4, SMARCA4. STK11, TP53, TSC1, TSC2, and VHL.  The following genes were evaluated for sequence changes only: SDHA and HOXB13 c.251G>A variant only.  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    PLAN: [name] is doing well today.  She has clinical sign of recurrence.  She is tolerating tamoxifen well and will continue this.  She notes that she would like to stay on this long term.  10 years total (since she did have a 2 year period where she wasn't taking the medication) is in 11/2020 and I let her know that we can revisit at that point.    [name] has not had a mammogram since 06/2017.  I ordered a repeat mammogram and encouraged her to go and get this done.    [name] is working on losing [employer] and I congratulated her about this.  I continued to encouraged regular f/u with her pcp, healthy diet and exercise.  I also suggested she look into [facility] or [facility] if she wants to see a gynecologist.    [name] will return in 01/2019 for f/u with Dr. Jana Hakim.  I let her know that she doesn't need to come back so soon, but she told me that she prefers more frequent appointments.  She knows to call for any questions or concerns between now and her next appointment.  A total of (30) minutes of face-to-face time was spent with this patient with greater than 50% of that time in counseling and care-coordination.  Wilber Bihari, NP  11/01/18 10:49 AM Medical Oncology and Hematology [facility] [address] [place], [place] [address] Tel.  [phone]    Fax. [phone]

## 2018-11-01 NOTE — Progress Notes (Signed)
cipro

## 2018-11-02 ENCOUNTER — Telehealth: Payer: Self-pay

## 2018-11-02 LAB — URINE CULTURE: Culture: NO GROWTH

## 2018-11-02 NOTE — Telephone Encounter (Signed)
-----   Message from Gardenia Phlegm, NP sent at 11/01/2018 12:02 PM EST ----- Please let patient know that her labs are normal ----- Message ----- From: Interface, Lab In [place] Sent: 11/01/2018  11:18 AM EST To: Gardenia Phlegm, NP

## 2018-11-02 NOTE — Telephone Encounter (Signed)
Nurse spoke with patient on 11/01/18 in the breast center waiting area.  Informed patient that labs are normal and gave a copy to her.  No concerns at that time.

## 2018-12-11 ENCOUNTER — Ambulatory Visit: Payer: Self-pay

## 2019-01-16 ENCOUNTER — Ambulatory Visit: Payer: Self-pay

## 2019-02-14 ENCOUNTER — Other Ambulatory Visit: Payer: Self-pay | Admitting: Oncology

## 2019-02-14 DIAGNOSIS — C50411 Malignant neoplasm of upper-outer quadrant of right female breast: Secondary | ICD-10-CM

## 2019-02-14 DIAGNOSIS — Z170 Estrogen receptor positive status [ER+]: Secondary | ICD-10-CM

## 2019-02-14 DIAGNOSIS — C50912 Malignant neoplasm of unspecified site of left female breast: Secondary | ICD-10-CM

## 2019-02-15 ENCOUNTER — Inpatient Hospital Stay: Payer: Medicare HMO

## 2019-02-15 ENCOUNTER — Other Ambulatory Visit: Payer: Self-pay

## 2019-02-15 ENCOUNTER — Inpatient Hospital Stay: Payer: Medicare HMO | Attending: Oncology | Admitting: Oncology

## 2019-02-15 VITALS — BP 123/100 | HR 71 | Temp 97.6°F | Resp 18 | Ht 66.0 in | Wt 209.3 lb

## 2019-02-15 DIAGNOSIS — Z170 Estrogen receptor positive status [ER+]: Secondary | ICD-10-CM

## 2019-02-15 DIAGNOSIS — R232 Flushing: Secondary | ICD-10-CM

## 2019-02-15 DIAGNOSIS — Z79810 Long term (current) use of selective estrogen receptor modulators (SERMs): Secondary | ICD-10-CM | POA: Diagnosis not present

## 2019-02-15 DIAGNOSIS — C50411 Malignant neoplasm of upper-outer quadrant of right female breast: Secondary | ICD-10-CM

## 2019-02-15 DIAGNOSIS — Z923 Personal history of irradiation: Secondary | ICD-10-CM | POA: Diagnosis not present

## 2019-02-15 DIAGNOSIS — Z803 Family history of malignant neoplasm of breast: Secondary | ICD-10-CM | POA: Diagnosis not present

## 2019-02-15 DIAGNOSIS — Z79899 Other long term (current) drug therapy: Secondary | ICD-10-CM

## 2019-02-15 DIAGNOSIS — Z8249 Family history of ischemic heart disease and other diseases of the circulatory system: Secondary | ICD-10-CM | POA: Diagnosis not present

## 2019-02-15 DIAGNOSIS — C50912 Malignant neoplasm of unspecified site of left female breast: Secondary | ICD-10-CM

## 2019-02-15 LAB — CMP (CANCER CENTER ONLY)
ALT: 17 U/L (ref 0–44)
AST: 24 U/L (ref 15–41)
Albumin: 3.4 g/dL — ABNORMAL LOW (ref 3.5–5.0)
Alkaline Phosphatase: 88 U/L (ref 38–126)
Anion gap: 8 (ref 5–15)
BUN: 9 mg/dL (ref 8–23)
CO2: 26 mmol/L (ref 22–32)
Calcium: 9.2 mg/dL (ref 8.9–10.3)
Chloride: 106 mmol/L (ref 98–111)
Creatinine: 1.04 mg/dL — ABNORMAL HIGH (ref 0.44–1.00)
GFR, Est AFR Am: >60 mL/min (ref >60)
GFR, Estimated: 54 mL/min — ABNORMAL LOW (ref >60)
Glucose, Bld: 101 mg/dL — ABNORMAL HIGH (ref 70–99)
Potassium: 3.5 mmol/L (ref 3.5–5.1)
Sodium: 140 mmol/L (ref 135–145)
Total Bilirubin: 0.3 mg/dL (ref 0.3–1.2)
Total Protein: 7.4 g/dL (ref 6.5–8.1)

## 2019-02-15 LAB — CBC WITH DIFFERENTIAL (CANCER CENTER ONLY)
Abs Immature Granulocytes: 0.01 10*3/uL (ref 0.00–0.07)
Basophils Absolute: 0 10*3/uL (ref 0.0–0.1)
Basophils Relative: 1 %
Eosinophils Absolute: 0.1 10*3/uL (ref 0.0–0.5)
Eosinophils Relative: 3 %
HCT: 39.1 % (ref 36.0–46.0)
Hemoglobin: 12.5 g/dL (ref 12.0–15.0)
Immature Granulocytes: 0 %
Lymphocytes Relative: 49 %
Lymphs Abs: 2 10*3/uL (ref 0.7–4.0)
MCH: 30.6 pg (ref 26.0–34.0)
MCHC: 32 g/dL (ref 30.0–36.0)
MCV: 95.6 fL (ref 80.0–100.0)
Monocytes Absolute: 0.5 10*3/uL (ref 0.1–1.0)
Monocytes Relative: 12 %
Neutro Abs: 1.4 10*3/uL — ABNORMAL LOW (ref 1.7–7.7)
Neutrophils Relative %: 35 %
Platelet Count: 205 10*3/uL (ref 150–400)
RBC: 4.09 MIL/uL (ref 3.87–5.11)
RDW: 13 % (ref 11.5–15.5)
WBC Count: 4.1 10*3/uL (ref 4.0–10.5)
nRBC: 0 % (ref 0.0–0.2)

## 2019-02-15 NOTE — Progress Notes (Signed)
[place]  Telephone:([phone]) [phone] Fax:(336) [phone]     ID: [name] DOB: 14-Jun-1951  MR#: [id number]  RJJ#:[id number]  Patient Care Team: Suella Broad, FNP as PCP - General (Nurse Practitioner) Skeet Latch, MD as PCP - Cardiology (Cardiology) Gavin Pound, MD as Consulting Physician (Rheumatology) Willia Craze, NP as Nurse Practitioner (Gastroenterology) Magrinat, Virgie Dad, MD as Consulting Physician (Oncology) Leanora Cover, MD as Consulting Physician (Orthopedic Surgery) OTHER MD:   CHIEF COMPLAINT: Estrogen receptor positive breast cancer  CURRENT TREATMENT: tamoxifen    INTERVAL HISTORY: [name] returns today for follow-up of her estrogen receptor positive breast cancer.   She continues on tamoxifen, with good tolerance. She reports hot flashes and vaginal wetness, but neither of these are bothersome.  She is scheduled to undergo screening mammogram on 03/07/2019.   REVIEW OF SYSTEMS: [name] reports doing well overall. She continues gardening. She walks about 2 miles every day. She continues to Medical illustrator; they are doing online classes. She notes her husband is doing well. She notes she lost her brother (41 y.o.), her niece (83 y.o.), and her aunt 70 y.o.) to the coronavirus. They all lived in [place].   The patient denies unusual headaches, visual changes, nausea, vomiting, stiff neck, dizziness, or gait imbalance. There has been no cough, phlegm production, or pleurisy, no chest pain or pressure, and no change in bowel or bladder habits. The patient denies fever, rash, bleeding, unexplained fatigue or unexplained weight loss. A detailed review of systems was otherwise entirely negative.  Today is [name]'s 68th birthday!  BREAST CANCER HISTORY: From Dr. Dana Allan earlier summary:  "#1 patient is status post lumpectomy with sentinel lymph node biopsy followed by radiation to the right breast. In December  2009 patient began aromatase inhibitor but could not tolerate it.  #2 therefore she began in March 2010 tamoxifen 20 mg daily. However she has experienced significant amount of aches and pains and in December 2012 discontinued it. At this time she does not want to go back on any kind of antiestrogen therapy due to side effects.  #3 patient would like to go back on tamoxifen 20 mg daily. She recently attended finding your new normal seminar. And because of that she wants to she would like to go back on the tamoxifen. We discussed side effects again. She was sent to her pharmacy."  Her subsequent history is as detailed below   PAST MEDICAL HISTORY: Past Medical History:  Diagnosis Date  . Arthritis    R knee, hands   . Blood transfusion    1986- post-partial hysterectomy  . Breast cancer [facility]) 2009   right lumpectomy and radiation  . Chronic kidney disease   . Colon cancer [facility]) 2003   per pt report, cancer found in a colon polyp in 2003, procedure done in [place].  did not require surgery, chemo etc.    . Family history of breast cancer 10/24/2017  . Gastritis   . GERD (gastroesophageal reflux disease)    recently taken off anti reflux tx  . Heart murmur    functional murmur, echo in [place]- long time ago   . Hepatitis    Hep. A- 1978  . Hiatal hernia   . Hypertension   . Lupus ([place])   . Personal history of radiation therapy 2009  . Renal disorder   . Syncope and collapse     PAST SURGICAL HISTORY: Past Surgical History:  Procedure Laterality Date  . ABDOMINAL HYSTERECTOMY  1986  . APPENDECTOMY     1978 & tubal ligation   . BIOPSY  07/25/2018   Procedure: BIOPSY;  Surgeon: Milus Banister, MD;  Location: [facility] ENDOSCOPY;  Service: Endoscopy;;  . BREAST EXCISIONAL BIOPSY Left 1998  . BREAST SURGERY     R breast lumpectomy  . CHOLECYSTECTOMY     1978- open   . ESOPHAGOGASTRODUODENOSCOPY (EGD) WITH PROPOFOL N/A 07/25/2018   Procedure: ESOPHAGOGASTRODUODENOSCOPY (EGD) WITH  PROPOFOL;  Surgeon: Milus Banister, MD;  Location: [facility] ENDOSCOPY;  Service: Endoscopy;  Laterality: N/A;  . REPLACEMENT TOTAL KNEE Left   . TONSILLECTOMY     1954  . TOTAL KNEE ARTHROPLASTY  08/30/2011   Procedure: TOTAL KNEE ARTHROPLASTY;  Surgeon: Kerin Salen;  Location: [facility];  Service: Orthopedics;  Laterality: Right;  Right Total Knee Arthroplasty  . TUBAL LIGATION      FAMILY HISTORY Family History  Problem Relation Age of Onset  . Heart attack Father   . Cancer Other   . Hypertension Other   . Breast cancer Mother 27  . Hypertension Mother   . Hypertension Brother   . Kidney disease Brother   . Breast cancer Maternal Aunt 98  . Cancer Paternal Aunt 11       type unk  . Other Maternal Grandmother 102       brain tumor dx at 55- did not bx or do additional testing on tumor  . Cancer Paternal Grandmother 30       type unk  . Lung cancer Paternal Grandmother   . Anesthesia problems Neg Hx   . Hypotension Neg Hx   . Malignant hyperthermia Neg Hx   . Pseudochol deficiency Neg Hx   . Colon cancer Neg Hx   . Esophageal cancer Neg Hx   . Stomach cancer Neg Hx   . Pancreatic cancer Neg Hx   . Liver disease Neg Hx    the patient's father died from a heart attack at the age of 65. The patient's mother died at the age of 12. She had a history of breast cancer and one of her sisters was diagnosed with breast cancer at the age of 9. The patient had 5 brothers and 5 sisters. There is no other history of breast or ovarian cancer in the family to her knowledge.   GYNECOLOGIC HISTORY:  No LMP recorded. Patient has had a hysterectomy. Menarche age 65, first live birth age 14. The patient is GX P3. She had a simple hysterectomy in 1986 (no salpingo-oophorectomy).   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 She remarried in 2011. Her husband Laveda Abbe is retired from [employer]. The patient has one  adopted son in addition to her own children. 3 of her children live in town 1 in [place]. She has 6 grandchildren and one on the way grandchildren. She attends [employer] locally.    ADVANCED DIRECTIVES: Not in place   HEALTH MAINTENANCE: Social History   Tobacco Use  . Smoking status: Never Smoker  . Smokeless tobacco: Never Used  Substance Use Topics  . Alcohol use: No  . Drug use: No     Colonoscopy:  PAP:  Bone density: 04/30/2008 was normal  Lipid panel:  Allergies  Allergen Reactions  . Iodine Anaphylaxis  . Iohexol Anaphylaxis     Code: HIVES, Desc: PT IS  ALLERGIC TO IODINATED CONTRAST; REACTION INCLUDES FACIAL SWELLING,HIVES,RESPIRATORY DISTRESS;PT HASN'T HAD SCAN W/PREMEDS.;REFUSED CONTRAST OF ANY KIND!  KR   . Latex Anaphylaxis  . Penicillins Anaphylaxis and Rash    Has patient had a PCN reaction causing immediate rash, facial/tongue/throat swelling, SOB or lightheadedness with hypotension: Yes Has patient had a PCN reaction causing severe rash involving mucus membranes or skin necrosis: Yes Has patient had a PCN reaction that required hospitalization: No Has patient had a PCN reaction occurring within the last 10 years: No If all of the above answers are "NO", then may proceed with Cephalosporin use.   . Tetracycline Anaphylaxis    Current Outpatient Medications  Medication Sig Dispense Refill  . busPIRone (BUSPAR) 5 MG tablet Take 10 mg by mouth 3 (three) times daily.   3  . cholecalciferol (VITAMIN D) 1000 units tablet Take 1,000 Units by mouth daily.    . ciprofloxacin (CIPRO) 250 MG tablet Take 1 tablet (250 mg total) by mouth 2 (two) times daily. 10 tablet 0  . fluconazole (DIFLUCAN) 150 MG tablet Take 1 tablet (150 mg total) by mouth daily. May repeat x 1 if needed 2 tablet 0  . hydrochlorothiazide (HYDRODIURIL) 25 MG tablet Take 25 mg by mouth daily.     . metoprolol succinate (TOPROL-XL) 25 MG 24 hr tablet Take 2 tablets  (50 mg total) by mouth every morning. (Patient taking differently: Take 50 mg by mouth daily. ) 60 tablet 1  . omeprazole (PRILOSEC) 40 MG capsule Take 1 capsule (40 mg total) by mouth daily. 30 capsule 5  . tamoxifen (NOLVADEX) 20 MG tablet Take 1 tablet (20 mg total) by mouth daily. 90 tablet 4   No current facility-administered medications for this visit.     OBJECTIVE: Middle-aged African-American woman who appears well Vitals:   02/15/19 1450  BP: (!) 123/100  Pulse: 71  Resp: 18  Temp: 97.6 F (36.4 C)  SpO2: 98%     Body mass index is 33.78 kg/m.    ECOG FS:0 - Asymptomatic  Sclerae unicteric, EOMs intact Wearing a mask (which she made herself). No cervical or supraclavicular adenopathy Lungs no rales or rhonchi Heart regular rate and rhythm Abd soft, nontender, positive bowel sounds MSK no focal spinal tenderness, no upper extremity lymphedema Neuro: nonfocal, well oriented, appropriate affect Breasts: Deferred    LAB RESULTS:  CMP     Component Value Date/Time   NA 143 11/01/2018 1105   NA 141 01/26/2017 1508   K 3.8 11/01/2018 1105   K 3.4 (L) 01/26/2017 1508   CL 107 11/01/2018 1105   CL 102 01/25/2013 0909   CO2 27 11/01/2018 1105   CO2 29 01/26/2017 1508   GLUCOSE 93 11/01/2018 1105   GLUCOSE 91 01/26/2017 1508   GLUCOSE 81 01/25/2013 0909   BUN 14 11/01/2018 1105   BUN 11.5 01/26/2017 1508   CREATININE 0.94 11/01/2018 1105   CREATININE 0.8 01/26/2017 1508   CALCIUM 9.4 11/01/2018 1105   CALCIUM 9.5 01/26/2017 1508   PROT 7.1 11/01/2018 1105   PROT 7.1 01/26/2017 1508   ALBUMIN 3.6 11/01/2018 1105   ALBUMIN 3.5 01/26/2017 1508   AST 25 11/01/2018 1105   AST 22 01/26/2017 1508   ALT 16 11/01/2018 1105   ALT 17 01/26/2017 1508   ALKPHOS 85 11/01/2018 1105   ALKPHOS 72 01/26/2017 1508   BILITOT 0.4 11/01/2018 1105   BILITOT 0.38 01/26/2017 1508   GFRNONAA >60 11/01/2018 1105   GFRAA >60 11/01/2018  [place] results found for: SPEP, UPEP   Lab Results  Component Value Date   WBC 4.1 02/15/2019   NEUTROABS 1.4 (L) 02/15/2019   HGB 12.5 02/15/2019   HCT 39.1 02/15/2019   MCV 95.6 02/15/2019   PLT 205 02/15/2019      Chemistry      Component Value Date/Time   NA 143 11/01/2018 1105   NA 141 01/26/2017 1508   K 3.8 11/01/2018 1105   K 3.4 (L) 01/26/2017 1508   CL 107 11/01/2018 1105   CL 102 01/25/2013 0909   CO2 27 11/01/2018 1105   CO2 29 01/26/2017 1508   BUN 14 11/01/2018 1105   BUN 11.5 01/26/2017 1508   CREATININE 0.94 11/01/2018 1105   CREATININE 0.8 01/26/2017 1508      Component Value Date/Time   CALCIUM 9.4 11/01/2018 1105   CALCIUM 9.5 01/26/2017 1508   ALKPHOS 85 11/01/2018 1105   ALKPHOS 72 01/26/2017 1508   AST 25 11/01/2018 1105   AST 22 01/26/2017 1508   ALT 16 11/01/2018 1105   ALT 17 01/26/2017 1508   BILITOT 0.4 11/01/2018 1105   BILITOT 0.38 01/26/2017 1508       Lab Results  Component Value Date   LABCA2 16 11/09/2010    No components found for: [id number]  No results for input(s): INR in the last 168 hours.  Urinalysis    Component Value Date/Time   COLORURINE YELLOW 11/01/2018 1003   APPEARANCEUR HAZY (A) 11/01/2018 1003   LABSPEC 1.017 11/01/2018 1003   LABSPEC 1.015 01/26/2017 1523   PHURINE 5.0 11/01/2018 1003   GLUCOSEU NEGATIVE 11/01/2018 1003   GLUCOSEU Negative 01/26/2017 1523   HGBUR SMALL (A) 11/01/2018 1003   HGBUR negative 01/22/2010 1551   BILIRUBINUR NEGATIVE 11/01/2018 1003   BILIRUBINUR Negative 01/26/2017 1523   KETONESUR NEGATIVE 11/01/2018 1003   PROTEINUR NEGATIVE 11/01/2018 1003   UROBILINOGEN 0.2 01/26/2017 1523   NITRITE NEGATIVE 11/01/2018 1003   LEUKOCYTESUR SMALL (A) 11/01/2018 1003   LEUKOCYTESUR Small 01/26/2017 1523    STUDIES: No results found.   ASSESSMENT: 68 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  (2) status post right lumpectomy 03/20/2008 for invasive lobular carcinoma, multifocal, pT1a pNX, stage IA, grade 2, strongly estrogen and progesterone receptor positive, HER-2 negative. with negative margins.  (3) status post right axillary sentinel lymph node sampling 04/17/2008, the single sentinel lymph node being clear  (4) Oncotype score of 10 predicts an outside the breast risk of recurrence of 7% if the patient's only systemic therapy is tamoxifen for 5 years.  (5) Additional right breast biopsy 05/23/2008 to evaluate microcalcifications seen on pre-radiotherapy mammography showed only atypical lobular hyperplasia  (6) Completed radiation to the right breast 07/26/2008 (50 gray +14 gray boost).  (7) Started on anastrozole November 2009, with poor tolerance  (8). Started on tamoxifen March 2010, continued to December 2012, resumed December 2014   (a)  The plan is to continue tamoxifen through March 2022 (at least--she would like to take for the rest of her life)  (9) status post simple hysterectomy 1986 (no salpingo-oophorectomy)  (10) genetics testing 10/31/2017 through the Common Hereditary Cancers Panel offered by [facility] found no deleterious mutations in APC, ATM, AXIN2, BARD1, BMPR1A, BRCA1, BRCA2, BRIP1, CDH1, CDKN2A (p14ARF), CDKN2A (p16INK4a), CKD4, CHEK2, CTNNA1, DICER1, EPCAM (Deletion/duplication testing only), GREM1 (  promoter region deletion/duplication testing only), KIT, MEN1, MLH1, MSH2, MSH3, MSH6, MUTYH, NBN, NF1, NHTL1, PALB2, PDGFRA, PMS2, POLD1, POLE, PTEN, RAD50, RAD51C, RAD51D, SDHB, SDHC, SDHD, SMAD4, SMARCA4. STK11, TP53, TSC1, TSC2, and VHL.  The following genes were evaluated for sequence changes only: SDHA and HOXB13 c.251G>A variant only.  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    PLAN: [name] is just about 11 years out from definitive surgery for her breast  cancer with no evidence of disease recurrence.  This is very favorable.  She is tolerating tamoxifen well.  Her idea is to stay on it indefinitely.  We have agreed to continue at least through 2022 if she wishes to continue beyond that date considering how well she is tolerating it I certainly have no problem.  She understands we do not have data to go beyond 10 years.  She is interested in going on a keto diet.  I have no problems with that.  If she does keep doing she will probably lose weight which are her which is her main motivator.  She is scheduled for mammography in June.  She will see me again in 6 months, at her request.  She knows to call for any other issue that may develop before then.  Wilber Bihari, NP  02/15/19 3:24 PM Medical Oncology and Hematology [facility] [address] [place], [place] [address] Tel. [phone]    Fax. [phone]   I, Wilburn Mylar, am acting as scribe for Dr. Virgie Dad. Magrinat.  I, Lurline Del MD, have reviewed the above documentation for accuracy and completeness, and I agree with the above.

## 2019-02-23 ENCOUNTER — Encounter: Payer: Self-pay | Admitting: Genetic Counselor

## 2019-02-23 NOTE — Progress Notes (Signed)
UPDATE: The SDHB c.65G>C (p.Cys22Ser) VUS has been reclassified to Likely Benign as a result of re-review of the evidence in light of new variant interpretation guidelines and/or new information.  The amended report date is 02/21/2019.

## 2019-03-07 ENCOUNTER — Other Ambulatory Visit: Payer: Self-pay

## 2019-03-07 ENCOUNTER — Ambulatory Visit
Admission: RE | Admit: 2019-03-07 | Discharge: 2019-03-07 | Disposition: A | Discharge status: Home / Self Care | Payer: Medicare HMO | Source: Ambulatory Visit | Attending: Adult Health | Admitting: Adult Health

## 2019-03-07 DIAGNOSIS — Z1239 Encounter for other screening for malignant neoplasm of breast: Secondary | ICD-10-CM

## 2019-07-30 ENCOUNTER — Telehealth: Payer: Self-pay | Admitting: *Deleted

## 2019-07-30 NOTE — Telephone Encounter (Signed)
This RN return VM ( pt left message on Friday late afternoon and then this AM per Triage VM which was transferred to this phone ).  Obtained identified AM- message left stating call was being returned- requested to call again.

## 2019-08-19 NOTE — Progress Notes (Signed)
[place]  [phone] [phone]     ID: [name] DOB: Feb 08, 1951  MR#: [id number]  MEB#:[id number]  Patient Care Team: Suella Broad, FNP as PCP - General (Nurse Practitioner) Skeet Latch, MD as PCP - Cardiology (Cardiology) Gavin Pound, MD as Consulting Physician (Rheumatology) Willia Craze, NP as Nurse Practitioner (Gastroenterology) Shaquanta Harkless, Virgie Dad, MD as Consulting Physician (Oncology) Leanora Cover, MD as Consulting Physician (Orthopedic Surgery) OTHER MD:   CHIEF COMPLAINT: Estrogen receptor positive breast cancer  CURRENT TREATMENT: tamoxifen    INTERVAL HISTORY: [name] returns today for follow-up of her estrogen receptor positive breast cancer.   She continues on tamoxifen, with good tolerance. She reports hot flashes and vaginal wetness, but neither of these are bothersome.  Since her last visit, she underwent bilateral screening mammography with tomography at [place] on 03/07/2019 showing: breast density category C; no evidence of malignancy in either breast.    REVIEW OF SYSTEMS: [name] has been having some urinary urgency and some discomfort in the right flank so she wanted to make sure we got a urine culture for her today in addition to other labs.  She is currently very anxious about her daughter who had a positive Pap smear and then went for mammogram and they found she had a bump in the breast so she is getting multiple biopsies.  [name]'s blood pressure was a little high today and that may be part of the reason.  Otherwise she is being very careful as far as the pandemic is concerned.  A detailed review of systems was otherwise noncontributory   BREAST CANCER HISTORY: From Dr. Dana Allan earlier summary:  "#1 patient is status post lumpectomy with sentinel lymph node biopsy followed by radiation to the right breast. In December 2009 patient began aromatase inhibitor but could  not tolerate it.  #2 therefore she began in March 2010 tamoxifen 20 mg daily. However she has experienced significant amount of aches and pains and in December 2012 discontinued it. At this time she does not want to go back on any kind of antiestrogen therapy due to side effects.  #3 patient would like to go back on tamoxifen 20 mg daily. She recently attended finding your new normal seminar. And because of that she wants to she would like to go back on the tamoxifen. We discussed side effects again. She was sent to her pharmacy."  Her subsequent history is as detailed below   PAST MEDICAL HISTORY: Past Medical History:  Diagnosis Date  . Arthritis    R knee, hands   . Blood transfusion    1986- post-partial hysterectomy  . Breast cancer [facility]) 2009   right lumpectomy and radiation  . Chronic kidney disease   . Colon cancer [facility]) 2003   per pt report, cancer found in a colon polyp in 2003, procedure done in [place].  did not require surgery, chemo etc.    . Family history of breast cancer 10/24/2017  . Gastritis   . GERD (gastroesophageal reflux disease)    recently taken off anti reflux tx  . Heart murmur    functional murmur, echo in [place]- long time ago   . Hepatitis    Hep. A- 1978  . Hiatal hernia   . Hypertension   . Lupus ([place])   . Personal history of radiation therapy 2009  . Renal disorder   . Syncope and collapse     PAST SURGICAL HISTORY: Past Surgical History:  Procedure  Laterality Date  . ABDOMINAL HYSTERECTOMY     1986  . APPENDECTOMY     1978 & tubal ligation   . BIOPSY  07/25/2018   Procedure: BIOPSY;  Surgeon: Milus Banister, MD;  Location: [facility] ENDOSCOPY;  Service: Endoscopy;;  . BREAST EXCISIONAL BIOPSY Left 1998  . BREAST SURGERY     R breast lumpectomy  . CHOLECYSTECTOMY     1978- open   . ESOPHAGOGASTRODUODENOSCOPY (EGD) WITH PROPOFOL N/A 07/25/2018   Procedure: ESOPHAGOGASTRODUODENOSCOPY (EGD) WITH PROPOFOL;  Surgeon: Milus Banister, MD;  Location: [facility]  ENDOSCOPY;  Service: Endoscopy;  Laterality: N/A;  . REPLACEMENT TOTAL KNEE Left   . TONSILLECTOMY     1954  . TOTAL KNEE ARTHROPLASTY  08/30/2011   Procedure: TOTAL KNEE ARTHROPLASTY;  Surgeon: Kerin Salen;  Location: [place];  Service: Orthopedics;  Laterality: Right;  Right Total Knee Arthroplasty  . TUBAL LIGATION      FAMILY HISTORY Family History  Problem Relation Age of Onset  . Heart attack Father   . Cancer Other   . Hypertension Other   . Breast cancer Mother 80  . Hypertension Mother   . Hypertension Brother   . Kidney disease Brother   . Breast cancer Maternal Aunt 50  . Cancer Paternal Aunt 50       type unk  . Other Maternal Grandmother 102       brain tumor dx at 20- did not bx or do additional testing on tumor  . Cancer Paternal Grandmother 36       type unk  . Lung cancer Paternal Grandmother   . Anesthesia problems Neg Hx   . Hypotension Neg Hx   . Malignant hyperthermia Neg Hx   . Pseudochol deficiency Neg Hx   . Colon cancer Neg Hx   . Esophageal cancer Neg Hx   . Stomach cancer Neg Hx   . Pancreatic cancer Neg Hx   . Liver disease Neg Hx    the patient's father died from a heart attack at the age of 78. The patient's mother died at the age of 5. She had a history of breast cancer and one of her sisters was diagnosed with breast cancer at the age of 65. The patient had 5 brothers and 5 sisters. There is no other history of breast or ovarian cancer in the family to her knowledge.   GYNECOLOGIC HISTORY:  No LMP recorded. Patient has had a hysterectomy. Menarche age 34, first live birth age 40. The patient is GX P3. She had a simple hysterectomy in 1986 (no salpingo-oophorectomy).   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 She remarried in 2011. Her husband Laveda Abbe is retired from [employer]. The patient has one adopted son in addition to her own children. 3 of her  children live in town 1 in [place]. She has 7 grandchildren. She attends [employer] locally.    ADVANCED DIRECTIVES: Not in place   HEALTH MAINTENANCE: Social History   Tobacco Use  . Smoking status: Never Smoker  . Smokeless tobacco: Never Used  Substance Use Topics  . Alcohol use: No  . Drug use: No     Colonoscopy:  PAP:  Bone density: 04/30/2008 was normal  Lipid panel:  Allergies  Allergen Reactions  . Iodine Anaphylaxis  . Iohexol Anaphylaxis     Code:  HIVES, Desc: PT IS ALLERGIC TO IODINATED CONTRAST; REACTION INCLUDES FACIAL SWELLING,HIVES,RESPIRATORY DISTRESS;PT HASN'T HAD SCAN W/PREMEDS.;REFUSED CONTRAST OF ANY KIND!  KR   . Latex Anaphylaxis  . Penicillins Anaphylaxis and Rash    Has patient had a PCN reaction causing immediate rash, facial/tongue/throat swelling, SOB or lightheadedness with hypotension: Yes Has patient had a PCN reaction causing severe rash involving mucus membranes or skin necrosis: Yes Has patient had a PCN reaction that required hospitalization: No Has patient had a PCN reaction occurring within the last 10 years: No If all of the above answers are "NO", then may proceed with Cephalosporin use.   . Tetracycline Anaphylaxis    Current Outpatient Medications  Medication Sig Dispense Refill  . busPIRone (BUSPAR) 5 MG tablet Take 10 mg by mouth 3 (three) times daily.   3  . cholecalciferol (VITAMIN D) 1000 units tablet Take 1,000 Units by mouth daily.    . ciprofloxacin (CIPRO) 250 MG tablet Take 1 tablet (250 mg total) by mouth 2 (two) times daily. 10 tablet 0  . fluconazole (DIFLUCAN) 150 MG tablet Take 1 tablet (150 mg total) by mouth daily. May repeat x 1 if needed 2 tablet 0  . hydrochlorothiazide (HYDRODIURIL) 25 MG tablet Take 25 mg by mouth daily.     . metoprolol succinate (TOPROL-XL) 25 MG 24 hr tablet Take 2 tablets (50 mg total) by mouth every morning. (Patient taking differently: Take 50 mg by mouth  daily. ) 60 tablet 1  . omeprazole (PRILOSEC) 40 MG capsule Take 1 capsule (40 mg total) by mouth daily. 30 capsule 5  . tamoxifen (NOLVADEX) 20 MG tablet Take 1 tablet (20 mg total) by mouth daily. 90 tablet 4   No current facility-administered medications for this visit.     OBJECTIVE: Middle-aged African-American woman in no acute distress Vitals:   08/20/19 1524  BP: (!) 178/95  Pulse: 73  Resp: 18  Temp: 97.8 F (36.6 C)  SpO2: 100%     Body mass index is 31.91 kg/m.    ECOG FS:1 - Symptomatic but completely ambulatory   Repeat blood pressure at the end of physical exam was 142/84  Sclerae unicteric, EOMs intact Wearing a mask No cervical or supraclavicular adenopathy Lungs no rales or rhonchi Heart regular rate and rhythm Abd soft, nontender, positive bowel sounds MSK no focal spinal tenderness, no upper extremity lymphedema Neuro: nonfocal, well oriented, appropriate affect Breasts: The right breast is status post lumpectomy and radiation.  There is no evidence of local recurrence.  The left breast is benign.  Both axillae are benign.   LAB RESULTS:  CMP     Component Value Date/Time   NA 144 08/20/2019 1442   NA 141 01/26/2017 1508   K 3.2 (L) 08/20/2019 1442   K 3.4 (L) 01/26/2017 1508   CL 108 08/20/2019 1442   CL 102 01/25/2013 0909   CO2 25 08/20/2019 1442   CO2 29 01/26/2017 1508   GLUCOSE 82 08/20/2019 1442   GLUCOSE 91 01/26/2017 1508   GLUCOSE 81 01/25/2013 0909   BUN 11 08/20/2019 1442   BUN 11.5 01/26/2017 1508   CREATININE 0.84 08/20/2019 1442   CREATININE 1.04 (H) 02/15/2019 1436   CREATININE 0.8 01/26/2017 1508   CALCIUM 9.0 08/20/2019 1442   CALCIUM 9.5 01/26/2017 1508   PROT 7.3 08/20/2019 1442   PROT 7.1 01/26/2017 1508   ALBUMIN 3.6 08/20/2019 1442   ALBUMIN 3.5 01/26/2017 1508   AST 25 08/20/2019 1442  AST 24 02/15/2019 1436   AST 22 01/26/2017 1508   ALT 19 08/20/2019 1442   ALT 17 02/15/2019 1436   ALT 17 01/26/2017 1508    ALKPHOS 101 08/20/2019 1442   ALKPHOS 72 01/26/2017 1508   BILITOT <0.2 (L) 08/20/2019 1442   BILITOT 0.3 02/15/2019 1436   BILITOT 0.38 01/26/2017 1508   GFRNONAA >60 08/20/2019 1442   GFRNONAA 54 (L) 02/15/2019 1436   GFRAA >60 08/20/2019 1442   GFRAA >60 02/15/2019 1436    INo results found for: SPEP, UPEP  Lab Results  Component Value Date   WBC 5.6 08/20/2019   NEUTROABS 1.6 (L) 08/20/2019   HGB 12.1 08/20/2019   HCT 37.1 08/20/2019   MCV 93.2 08/20/2019   PLT 191 08/20/2019      Chemistry      Component Value Date/Time   NA 144 08/20/2019 1442   NA 141 01/26/2017 1508   K 3.2 (L) 08/20/2019 1442   K 3.4 (L) 01/26/2017 1508   CL 108 08/20/2019 1442   CL 102 01/25/2013 0909   CO2 25 08/20/2019 1442   CO2 29 01/26/2017 1508   BUN 11 08/20/2019 1442   BUN 11.5 01/26/2017 1508   CREATININE 0.84 08/20/2019 1442   CREATININE 1.04 (H) 02/15/2019 1436   CREATININE 0.8 01/26/2017 1508      Component Value Date/Time   CALCIUM 9.0 08/20/2019 1442   CALCIUM 9.5 01/26/2017 1508   ALKPHOS 101 08/20/2019 1442   ALKPHOS 72 01/26/2017 1508   AST 25 08/20/2019 1442   AST 24 02/15/2019 1436   AST 22 01/26/2017 1508   ALT 19 08/20/2019 1442   ALT 17 02/15/2019 1436   ALT 17 01/26/2017 1508   BILITOT <0.2 (L) 08/20/2019 1442   BILITOT 0.3 02/15/2019 1436   BILITOT 0.38 01/26/2017 1508       Lab Results  Component Value Date   LABCA2 16 11/09/2010    No components found for: OINOM767  No results for input(s): INR in the last 168 hours.  Urinalysis    Component Value Date/Time   COLORURINE YELLOW 11/01/2018 1003   APPEARANCEUR HAZY (A) 11/01/2018 1003   LABSPEC 1.017 11/01/2018 1003   LABSPEC 1.015 01/26/2017 1523   PHURINE 5.0 11/01/2018 1003   GLUCOSEU NEGATIVE 11/01/2018 1003   GLUCOSEU Negative 01/26/2017 1523   HGBUR SMALL (A) 11/01/2018 1003   HGBUR negative 01/22/2010 1551   BILIRUBINUR NEGATIVE 11/01/2018 1003   BILIRUBINUR Negative 01/26/2017  1523   KETONESUR NEGATIVE 11/01/2018 1003   PROTEINUR NEGATIVE 11/01/2018 1003   UROBILINOGEN 0.2 01/26/2017 1523   NITRITE NEGATIVE 11/01/2018 1003   LEUKOCYTESUR SMALL (A) 11/01/2018 1003   LEUKOCYTESUR Small 01/26/2017 1523    STUDIES: Mammography results reviewed with the patient  ASSESSMENT: 68 y.o. 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  (2) status post right lumpectomy 03/20/2008 for invasive lobular carcinoma, multifocal, pT1a pNX, stage IA, grade 2, strongly estrogen and progesterone receptor positive, HER-2 negative. with negative margins.  (3) status post right axillary sentinel lymph node sampling 04/17/2008, the single sentinel lymph node being clear  (4) Oncotype score of 10 predicts an outside the breast risk of recurrence of 7% if the patient's only systemic therapy is tamoxifen for 5 years.  (5) Additional right breast biopsy 05/23/2008 to evaluate microcalcifications seen on pre-radiotherapy mammography showed only atypical lobular hyperplasia  (  6) Completed radiation to the right breast 07/26/2008 (50 gray +14 gray boost).  (7) Started on anastrozole November 2009, with poor tolerance  (8). Started on tamoxifen March 2010, continued to December 2012, resumed December 2014   (a)  The plan is to continue tamoxifen through March 2022 (at least--she would like to take for the rest of her life)  (9) status post simple hysterectomy 1986 (no salpingo-oophorectomy)  (10) genetics testing 10/31/2017 through the Common Hereditary Cancers Panel offered by [facility] found no deleterious mutations in APC, ATM, AXIN2, BARD1, BMPR1A, BRCA1, BRCA2, BRIP1, CDH1, CDKN2A (p14ARF), CDKN2A (p16INK4a), CKD4, CHEK2, CTNNA1, DICER1, EPCAM (Deletion/duplication testing only), GREM1 (promoter region deletion/duplication  testing only), KIT, MEN1, MLH1, MSH2, MSH3, MSH6, MUTYH, NBN, NF1, NHTL1, PALB2, PDGFRA, PMS2, POLD1, POLE, PTEN, RAD50, RAD51C, RAD51D, SDHB, SDHC, SDHD, SMAD4, SMARCA4. STK11, TP53, TSC1, TSC2, and VHL.  The following genes were evaluated for sequence changes only: SDHA and HOXB13 c.251G>A variant only.  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    PLAN: [name] is just over 11 years out from definitive surgery for her breast cancer with no evidence of disease recurrence.  This is very favorable.  She is tolerating tamoxifen well and the plan is to continue that a minimum of 10 years.  We discussed her daughter's situation and I specifically suggested she check to see if her daughters breast density is D in which case they might consider starting a yearly breast MRIs in addition to mammography.  Hopefully however the biopsies will be normal.  We repeated [name]'s blood pressure when she was in the room and we had talked a little bit and it was lower but still a little higher than it should be.  She will review this further with her primary care physician at their next visit.  Otherwise she will have her next mammogram mid June and then see me again shortly after that.  (She prefers to be seen every 6 months).  I am starting her on Septra for her urinary tract symptoms.  She will let me know if the problem does not clear clear after 3 days.   Wilber Bihari, NP  08/20/19 3:55 PM Medical Oncology and Hematology [facility] [place], [place] [address] Tel. [phone]    Fax. [phone]   I, Wilburn Mylar, am acting as scribe for Dr. Virgie Dad. Maximilliano Kersh.  I, Lurline Del MD, have reviewed the above documentation for accuracy and completeness, and I agree with the above.

## 2019-08-20 ENCOUNTER — Inpatient Hospital Stay: Payer: Medicare HMO

## 2019-08-20 ENCOUNTER — Inpatient Hospital Stay: Payer: Medicare HMO | Attending: Oncology | Admitting: Oncology

## 2019-08-20 ENCOUNTER — Other Ambulatory Visit: Payer: Self-pay

## 2019-08-20 VITALS — BP 178/95 | HR 73 | Temp 97.8°F | Resp 18 | Ht 66.0 in | Wt 197.7 lb

## 2019-08-20 DIAGNOSIS — Z803 Family history of malignant neoplasm of breast: Secondary | ICD-10-CM | POA: Insufficient documentation

## 2019-08-20 DIAGNOSIS — Z841 Family history of disorders of kidney and ureter: Secondary | ICD-10-CM | POA: Diagnosis not present

## 2019-08-20 DIAGNOSIS — Z170 Estrogen receptor positive status [ER+]: Secondary | ICD-10-CM

## 2019-08-20 DIAGNOSIS — Z809 Family history of malignant neoplasm, unspecified: Secondary | ICD-10-CM | POA: Diagnosis not present

## 2019-08-20 DIAGNOSIS — C50411 Malignant neoplasm of upper-outer quadrant of right female breast: Secondary | ICD-10-CM

## 2019-08-20 DIAGNOSIS — Z8249 Family history of ischemic heart disease and other diseases of the circulatory system: Secondary | ICD-10-CM | POA: Diagnosis not present

## 2019-08-20 DIAGNOSIS — Z888 Allergy status to other drugs, medicaments and biological substances status: Secondary | ICD-10-CM | POA: Diagnosis not present

## 2019-08-20 DIAGNOSIS — Z923 Personal history of irradiation: Secondary | ICD-10-CM | POA: Insufficient documentation

## 2019-08-20 DIAGNOSIS — N189 Chronic kidney disease, unspecified: Secondary | ICD-10-CM | POA: Diagnosis not present

## 2019-08-20 DIAGNOSIS — Z801 Family history of malignant neoplasm of trachea, bronchus and lung: Secondary | ICD-10-CM | POA: Diagnosis not present

## 2019-08-20 DIAGNOSIS — Z79810 Long term (current) use of selective estrogen receptor modulators (SERMs): Secondary | ICD-10-CM | POA: Diagnosis not present

## 2019-08-20 DIAGNOSIS — Z79899 Other long term (current) drug therapy: Secondary | ICD-10-CM | POA: Insufficient documentation

## 2019-08-20 DIAGNOSIS — Z881 Allergy status to other antibiotic agents status: Secondary | ICD-10-CM | POA: Insufficient documentation

## 2019-08-20 DIAGNOSIS — Z853 Personal history of malignant neoplasm of breast: Secondary | ICD-10-CM | POA: Diagnosis not present

## 2019-08-20 DIAGNOSIS — Z8719 Personal history of other diseases of the digestive system: Secondary | ICD-10-CM | POA: Insufficient documentation

## 2019-08-20 DIAGNOSIS — Z85038 Personal history of other malignant neoplasm of large intestine: Secondary | ICD-10-CM | POA: Insufficient documentation

## 2019-08-20 DIAGNOSIS — Z880 Allergy status to penicillin: Secondary | ICD-10-CM | POA: Diagnosis not present

## 2019-08-20 LAB — COMPREHENSIVE METABOLIC PANEL
ALT: 19 U/L (ref 0–44)
AST: 25 U/L (ref 15–41)
Albumin: 3.6 g/dL (ref 3.5–5.0)
Alkaline Phosphatase: 101 U/L (ref 38–126)
Anion gap: 11 (ref 5–15)
BUN: 11 mg/dL (ref 8–23)
CO2: 25 mmol/L (ref 22–32)
Calcium: 9 mg/dL (ref 8.9–10.3)
Chloride: 108 mmol/L (ref 98–111)
Creatinine, Ser: 0.84 mg/dL (ref 0.44–1.00)
GFR calc Af Amer: >60 mL/min (ref >60)
GFR calc non Af Amer: >60 mL/min (ref >60)
Glucose, Bld: 82 mg/dL (ref 70–99)
Potassium: 3.2 mmol/L — ABNORMAL LOW (ref 3.5–5.1)
Sodium: 144 mmol/L (ref 135–145)
Total Bilirubin: <0.2 mg/dL — ABNORMAL LOW (ref 0.3–1.2)
Total Protein: 7.3 g/dL (ref 6.5–8.1)

## 2019-08-20 LAB — CBC WITH DIFFERENTIAL/PLATELET
Abs Immature Granulocytes: 0.01 10*3/uL (ref 0.00–0.07)
Basophils Absolute: 0 10*3/uL (ref 0.0–0.1)
Basophils Relative: 1 %
Eosinophils Absolute: 0.2 10*3/uL (ref 0.0–0.5)
Eosinophils Relative: 3 %
HCT: 37.1 % (ref 36.0–46.0)
Hemoglobin: 12.1 g/dL (ref 12.0–15.0)
Immature Granulocytes: 0 %
Lymphocytes Relative: 54 %
Lymphs Abs: 3 10*3/uL (ref 0.7–4.0)
MCH: 30.4 pg (ref 26.0–34.0)
MCHC: 32.6 g/dL (ref 30.0–36.0)
MCV: 93.2 fL (ref 80.0–100.0)
Monocytes Absolute: 0.7 10*3/uL (ref 0.1–1.0)
Monocytes Relative: 13 %
Neutro Abs: 1.6 10*3/uL — ABNORMAL LOW (ref 1.7–7.7)
Neutrophils Relative %: 29 %
Platelets: 191 10*3/uL (ref 150–400)
RBC: 3.98 MIL/uL (ref 3.87–5.11)
RDW: 12.8 % (ref 11.5–15.5)
WBC: 5.6 10*3/uL (ref 4.0–10.5)
nRBC: 0 % (ref 0.0–0.2)

## 2019-08-20 MED ORDER — SULFAMETHOXAZOLE-TRIMETHOPRIM 800-160 MG PO TABS: 1.0000 | ORAL_TABLET | Freq: Two times a day (BID) | ORAL | 0 refills | Status: DC

## 2019-08-21 ENCOUNTER — Telehealth: Payer: Self-pay | Admitting: Oncology

## 2019-08-21 ENCOUNTER — Telehealth: Payer: Self-pay

## 2019-08-21 LAB — URINE CULTURE: Culture: NO GROWTH

## 2019-08-21 MED ORDER — POTASSIUM CHLORIDE ER 10 MEQ PO TBCR: 10.0000 meq | EXTENDED_RELEASE_TABLET | Freq: Every day | ORAL | 4 refills | Status: DC

## 2019-08-21 NOTE — Addendum Note (Signed)
Addended by: Chauncey Cruel on: 08/21/2019 07:38 AM   Modules accepted: Orders

## 2019-08-21 NOTE — Telephone Encounter (Signed)
I talk with patient regarding schedule  

## 2019-08-21 NOTE — Telephone Encounter (Signed)
RN left voicemail for patient to return call regarding labs.   Per MD, start Potassium 10 mEq, continue on HCTZ.

## 2019-11-22 ENCOUNTER — Ambulatory Visit: Payer: Medicare HMO | Attending: Internal Medicine

## 2019-11-22 ENCOUNTER — Ambulatory Visit: Payer: Medicare HMO

## 2019-11-22 DIAGNOSIS — Z23 Encounter for immunization: Secondary | ICD-10-CM

## 2019-11-22 NOTE — Progress Notes (Signed)
   Covid-19 Vaccination Clinic  Name:  [name]    MRN: [id number] DOB: 05/04/1951  11/22/2019  [name] was observed post Covid-19 immunization for 30 minutes based on pre-vaccination screening without incident. She was provided with Vaccine Information Sheet and instruction to access the V-Safe system.   [name] was instructed to call 911 with any severe reactions post vaccine: Marland Kitchen Difficulty breathing  . Swelling of face and throat  . A fast heartbeat  . A bad rash all over body  . Dizziness and weakness

## 2019-12-03 ENCOUNTER — Emergency Department (HOSPITAL_COMMUNITY)
Admission: EM | Admit: 2019-12-03 | Discharge: 2019-12-04 | Disposition: A | Discharge status: Home / Self Care | Payer: Medicare HMO | Attending: Emergency Medicine | Admitting: Emergency Medicine

## 2019-12-03 ENCOUNTER — Emergency Department (HOSPITAL_COMMUNITY): Payer: Medicare HMO

## 2019-12-03 DIAGNOSIS — Z853 Personal history of malignant neoplasm of breast: Secondary | ICD-10-CM | POA: Diagnosis not present

## 2019-12-03 DIAGNOSIS — R002 Palpitations: Secondary | ICD-10-CM | POA: Diagnosis present

## 2019-12-03 DIAGNOSIS — N189 Chronic kidney disease, unspecified: Secondary | ICD-10-CM | POA: Insufficient documentation

## 2019-12-03 DIAGNOSIS — Z9049 Acquired absence of other specified parts of digestive tract: Secondary | ICD-10-CM | POA: Diagnosis not present

## 2019-12-03 DIAGNOSIS — Z79899 Other long term (current) drug therapy: Secondary | ICD-10-CM | POA: Diagnosis not present

## 2019-12-03 DIAGNOSIS — I129 Hypertensive chronic kidney disease with stage 1 through stage 4 chronic kidney disease, or unspecified chronic kidney disease: Secondary | ICD-10-CM | POA: Diagnosis not present

## 2019-12-03 DIAGNOSIS — Z96653 Presence of artificial knee joint, bilateral: Secondary | ICD-10-CM | POA: Diagnosis not present

## 2019-12-03 DIAGNOSIS — Z85038 Personal history of other malignant neoplasm of large intestine: Secondary | ICD-10-CM | POA: Diagnosis not present

## 2019-12-03 DIAGNOSIS — Z91040 Latex allergy status: Secondary | ICD-10-CM | POA: Diagnosis not present

## 2019-12-03 LAB — TROPONIN I (HIGH SENSITIVITY): Troponin I (High Sensitivity): 9 ng/L (ref <18)

## 2019-12-03 LAB — BASIC METABOLIC PANEL
Anion gap: 9 (ref 5–15)
BUN: 12 mg/dL (ref 8–23)
CO2: 24 mmol/L (ref 22–32)
Calcium: 9 mg/dL (ref 8.9–10.3)
Chloride: 102 mmol/L (ref 98–111)
Creatinine, Ser: 0.74 mg/dL (ref 0.44–1.00)
GFR calc Af Amer: >60 mL/min (ref >60)
GFR calc non Af Amer: >60 mL/min (ref >60)
Glucose, Bld: 104 mg/dL — ABNORMAL HIGH (ref 70–99)
Potassium: 3.9 mmol/L (ref 3.5–5.1)
Sodium: 135 mmol/L (ref 135–145)

## 2019-12-03 LAB — CBC
HCT: 39.4 % (ref 36.0–46.0)
Hemoglobin: 12.6 g/dL (ref 12.0–15.0)
MCH: 30.2 pg (ref 26.0–34.0)
MCHC: 32 g/dL (ref 30.0–36.0)
MCV: 94.5 fL (ref 80.0–100.0)
Platelets: 202 10*3/uL (ref 150–400)
RBC: 4.17 MIL/uL (ref 3.87–5.11)
RDW: 12.8 % (ref 11.5–15.5)
WBC: 8.7 10*3/uL (ref 4.0–10.5)
nRBC: 0 % (ref 0.0–0.2)

## 2019-12-03 MED ORDER — SODIUM CHLORIDE 0.9% FLUSH: 3.0000 mL | Freq: Once | INTRAVENOUS | Status: DC

## 2019-12-03 NOTE — ED Triage Notes (Signed)
Pt. Stated, I have Lupus and I woke up this morning and I was having palpitations, my body is achy all over, and I feel like my head is in a vice. Im really concerned with the heart palpitations.

## 2019-12-04 LAB — TROPONIN I (HIGH SENSITIVITY): Troponin I (High Sensitivity): 15 ng/L (ref <18)

## 2019-12-04 LAB — MAGNESIUM: Magnesium: 1.7 mg/dL (ref 1.7–2.4)

## 2019-12-04 MED ORDER — METOCLOPRAMIDE HCL 5 MG/ML IJ SOLN
10.0000 mg | INTRAMUSCULAR | Status: AC
  Administered 2019-12-04: 10 mg via INTRAVENOUS
  Filled 2019-12-04: qty 2

## 2019-12-04 MED ORDER — SODIUM CHLORIDE 0.9 % IV BOLUS
500.0000 mL | Freq: Once | INTRAVENOUS | Status: AC
  Administered 2019-12-04: 500 mL via INTRAVENOUS

## 2019-12-04 MED ORDER — ACETAMINOPHEN 325 MG PO TABS
650.0000 mg | ORAL_TABLET | Freq: Once | ORAL | Status: AC
  Administered 2019-12-04: 650 mg via ORAL
  Filled 2019-12-04: qty 2

## 2019-12-04 NOTE — ED Notes (Signed)
Also reports headache 10/10.

## 2019-12-04 NOTE — ED Provider Notes (Signed)
[place] EMERGENCY DEPARTMENT Provider Note   CSN: [id number] Arrival date & time: 12/03/19  1826     History Chief Complaint  Patient presents with  . Palpitations  . Generalized Body Aches    [name] is a 69 y.o. female.  Patient presents to the emergency department for evaluation of heart palpitations.  Patient reports that she has been experiencing intermittent palpitations for the last few days.  She has recurrent episodes of a sensation of feeling heaviness on her chest where she cannot catch her breath.  Symptoms last for a second or 2 and then resolved.  The symptoms occur intermittently without any factors that seem to cause them to occur.  Patient reports that when she got to the emergency room and was waiting in the waiting room she started to have nausea, vomiting and diarrhea.        Past Medical History:  Diagnosis Date  . Arthritis    R knee, hands   . Blood transfusion    1986- post-partial hysterectomy  . Breast cancer [facility]) 2009   right lumpectomy and radiation  . Chronic kidney disease   . Colon cancer [facility]) 2003   per pt report, cancer found in a colon polyp in 2003, procedure done in [place].  did not require surgery, chemo etc.    . Family history of breast cancer 10/24/2017  . Gastritis   . GERD (gastroesophageal reflux disease)    recently taken off anti reflux tx  . Heart murmur    functional murmur, echo in [place]- long time ago   . Hepatitis    Hep. A- 1978  . Hiatal hernia   . Hypertension   . Lupus ([place])   . Personal history of radiation therapy 2009  . Renal disorder   . Syncope and collapse     Patient Active Problem List   Diagnosis Date Noted  . History of breast cancer 08/07/2018  . Family history of coronary artery disease in father 08/07/2018  . Abdominal pain, epigastric   . Gastritis and gastroduodenitis   . Syncope and collapse 07/23/2018  . Genetic testing 11/24/2017  . Family history of breast  cancer 10/24/2017  . Personal history of colon cancer 10/24/2017  . Chest pain 10/05/2014  . Hypokalemia 10/05/2014  . Palpitations 09/09/2013  . Osteoarthritis of right knee 08/29/2011  . OTITIS MEDIA, RIGHT 01/22/2010  . OBESITY 12/24/2009  . CERUMEN IMPACTION, RIGHT 12/03/2009  . NEUROPATHY 09/04/2009  . FATIGUE 09/04/2009  . COUGH 09/04/2009  . ARTHRALGIA 04/22/2009  . DYSURIA 04/22/2009  . Malignant neoplasm of upper-outer quadrant of right breast in female, estrogen receptor positive ([place]) 01/11/2008  . Hiatal hernia 12/14/2007  . DIVERTICULOSIS, COLON 12/14/2007  . CONSTIPATION 12/14/2007  . MAMMOGRAM, ABNORMAL, RIGHT 12/14/2007  . RHEUMATIC FEVER 11/03/2007  . Essential hypertension 11/03/2007  . History of lupus ([place]) 11/03/2007  . HIP PAIN, RIGHT 11/03/2007  . Abdominal pain 11/03/2007    Past Surgical History:  Procedure Laterality Date  . ABDOMINAL HYSTERECTOMY     1986  . APPENDECTOMY     1978 & tubal ligation   . BIOPSY  07/25/2018   Procedure: BIOPSY;  Surgeon: Milus Banister, MD;  Location: [facility] ENDOSCOPY;  Service: Endoscopy;;  . BREAST EXCISIONAL BIOPSY Left 1998  . BREAST SURGERY     R breast lumpectomy  . CHOLECYSTECTOMY     1978- open   . ESOPHAGOGASTRODUODENOSCOPY (EGD) WITH PROPOFOL N/A 07/25/2018   Procedure: ESOPHAGOGASTRODUODENOSCOPY (  EGD) WITH PROPOFOL;  Surgeon: Milus Banister, MD;  Location: [facility] ENDOSCOPY;  Service: Endoscopy;  Laterality: N/A;  . REPLACEMENT TOTAL KNEE Left   . TONSILLECTOMY     1954  . TOTAL KNEE ARTHROPLASTY  08/30/2011   Procedure: TOTAL KNEE ARTHROPLASTY;  Surgeon: Kerin Salen;  Location: [place];  Service: Orthopedics;  Laterality: Right;  Right Total Knee Arthroplasty  . TUBAL LIGATION       OB History    Gravida  0   Para  0   Term  0   Preterm  0   AB  0   Living        SAB  0   TAB  0   Ectopic  0   Multiple      Live Births              Family History  Problem Relation Age of Onset    . Heart attack Father   . Cancer Other   . Hypertension Other   . Breast cancer Mother 23  . Hypertension Mother   . Hypertension Brother   . Kidney disease Brother   . Breast cancer Maternal Aunt 54  . Cancer Paternal Aunt 16       type unk  . Other Maternal Grandmother 102       brain tumor dx at 63- did not bx or do additional testing on tumor  . Cancer Paternal Grandmother 41       type unk  . Lung cancer Paternal Grandmother   . Anesthesia problems Neg Hx   . Hypotension Neg Hx   . Malignant hyperthermia Neg Hx   . Pseudochol deficiency Neg Hx   . Colon cancer Neg Hx   . Esophageal cancer Neg Hx   . Stomach cancer Neg Hx   . Pancreatic cancer Neg Hx   . Liver disease Neg Hx     Social History   Tobacco Use  . Smoking status: Never Smoker  . Smokeless tobacco: Never Used  Substance Use Topics  . Alcohol use: No  . Drug use: No    Home Medications Prior to Admission medications   Medication Sig Start Date End Date Taking? Authorizing Provider  busPIRone (BUSPAR) 5 MG tablet Take 10 mg by mouth 3 (three) times daily as needed (anxiety).  06/12/18  Yes [provider]  cholecalciferol (VITAMIN D) 1000 units tablet Take 1,000 Units by mouth daily.   Yes [provider]  hydrochlorothiazide (HYDRODIURIL) 25 MG tablet Take 25 mg by mouth daily.    Yes [provider]  ibuprofen (ADVIL) 800 MG tablet Take 800 mg by mouth every 8 (eight) hours as needed.   Yes [provider]  metoprolol succinate (TOPROL-XL) 25 MG 24 hr tablet Take 2 tablets (50 mg total) by mouth every morning. 10/03/17  Yes Magrinat, Virgie Dad, MD  Multiple Vitamins-Minerals (EMERGEN-C IMMUNE PLUS PO) Take 1 tablet by mouth daily.   Yes [provider]  omeprazole (PRILOSEC) 40 MG capsule Take 1 capsule (40 mg total) by mouth daily. 02/17/18  Yes Magrinat, Virgie Dad, MD  potassium chloride (KLOR-CON) 10 MEQ tablet Take 1 tablet (10 mEq total) by mouth daily.  08/21/19  Yes Magrinat, Virgie Dad, MD  tamoxifen (NOLVADEX) 20 MG tablet Take 1 tablet (20 mg total) by mouth daily. 02/17/18  Yes Magrinat, Virgie Dad, MD  ciprofloxacin (CIPRO) 250 MG tablet Take 1 tablet (250 mg total) by mouth 2 (two) times  daily. Patient not taking: Reported on 12/04/2019 11/01/18   Gardenia Phlegm, NP  fluconazole (DIFLUCAN) 150 MG tablet Take 1 tablet (150 mg total) by mouth daily. May repeat x 1 if needed Patient not taking: Reported on 12/04/2019 11/01/18   Gardenia Phlegm, NP    Allergies    Iodine, Iohexol, Latex, Penicillins, and Tetracycline  Review of Systems   Review of Systems  Cardiovascular: Positive for palpitations.  Gastrointestinal: Positive for diarrhea, nausea and vomiting.  All other systems reviewed and are negative.   Physical Exam Updated Vital Signs BP (!) 104/56   Pulse 71   Temp 98.6 F (37 C)   Resp 14   Ht 5\' 7"  (1.702 m)   Wt 88.5 kg   SpO2 99%   BMI 30.54 kg/m   Physical Exam Vitals and nursing note reviewed.  Constitutional:      General: She is not in acute distress.    Appearance: Normal appearance. She is well-developed.  HENT:     Head: Normocephalic and atraumatic.     Right Ear: Hearing normal.     Left Ear: Hearing normal.     Nose: Nose normal.  Eyes:     Conjunctiva/sclera: Conjunctivae normal.     Pupils: Pupils are equal, round, and reactive to light.  Cardiovascular:     Rate and Rhythm: Regular rhythm.     Heart sounds: S1 normal and S2 normal. No murmur. No friction rub. No gallop.   Pulmonary:     Effort: Pulmonary effort is normal. No respiratory distress.     Breath sounds: Normal breath sounds.  Chest:     Chest wall: No tenderness.  Abdominal:     General: Bowel sounds are normal.     Palpations: Abdomen is soft.     Tenderness: There is no abdominal tenderness. There is no guarding or rebound. Negative signs include Murphy's sign and McBurney's sign.     Hernia: No hernia is  present.  Musculoskeletal:        General: Normal range of motion.     Cervical back: Normal range of motion and neck supple.  Skin:    General: Skin is warm and dry.     Findings: No rash.  Neurological:     Mental Status: She is alert and oriented to person, place, and time.     GCS: GCS eye subscore is 4. GCS verbal subscore is 5. GCS motor subscore is 6.     Cranial Nerves: No cranial nerve deficit.     Sensory: No sensory deficit.     Coordination: Coordination normal.  Psychiatric:        Speech: Speech normal.        Behavior: Behavior normal.        Thought Content: Thought content normal.     ED Results / Procedures / Treatments   Labs (all labs ordered are listed, but only abnormal results are displayed) Labs Reviewed  BASIC METABOLIC PANEL - Abnormal; Notable for the following components:      Result Value   Glucose, Bld 104 (*)    All other components within normal limits  CBC  MAGNESIUM  TROPONIN I (HIGH SENSITIVITY)  TROPONIN I (HIGH SENSITIVITY)    EKG EKG Interpretation  Date/Time:  Monday December 03 2019 19:14:14 EDT Ventricular Rate:  90 PR Interval:  166 QRS Duration: 84 QT Interval:  402 QTC Calculation: 491 R Axis:   11 Text Interpretation: Normal sinus rhythm Prolonged QT  Abnormal ECG No significant change since last tracing Confirmed by Orpah Greek [phone]) on 12/04/2019 4:17:46 AM   Radiology DG Chest 2 View  Result Date: 12/03/2019 CLINICAL DATA:  Palpitations and body aches x1 day. EXAM: CHEST - 2 VIEW COMPARISON:  July 23, 2018 FINDINGS: There is mild, stable biapical pleural thickening. There is no evidence of acute infiltrate, pleural effusion or pneumothorax. The heart size and mediastinal contours are within normal limits. The visualized skeletal structures are unremarkable. IMPRESSION: No active cardiopulmonary disease. Electronically Signed   By: Virgina Norfolk M.D.   On: 12/03/2019 20:09    Procedures Procedures  (including critical care time)  Medications Ordered in ED Medications  sodium chloride flush (NS) 0.9 % injection 3 mL (3 mLs Intravenous Not Given 12/04/19 0235)  metoCLOPramide (REGLAN) injection 10 mg (10 mg Intravenous Given 12/04/19 0432)  sodium chloride 0.9 % bolus 500 mL (500 mLs Intravenous New Bag/Given 12/04/19 0434)  acetaminophen (TYLENOL) tablet 650 mg (650 mg Oral Given 12/04/19 0457)    ED Course  I have reviewed the triage vital signs and the nursing notes.  Pertinent labs & imaging results that were available during my care of the patient were reviewed by me and considered in my medical decision making (see chart for details).    MDM Rules/Calculators/A&P                      Patient presents to the emergency department for evaluation of heart palpitations.  Patient does have a history of palpitations but these have been much more frequent.  When I evaluated her in the room, symptoms were no longer occurring.  No arrhythmia noted.  EKG normal.  Troponins normal.  Magnesium normal.  Remainder of electrolytes and work-up were unremarkable.  Patient not experiencing shortness of breath, no continuous chest pain.  Patient not tachycardic, tachypneic or hypoxic.  Doubt PE.  She did report an episode of vomiting and diarrhea while in the waiting room but abdominal exam is benign.  Will have follow-up with PCP and cardiology.  Final Clinical Impression(s) / ED Diagnoses Final diagnoses:  Heart palpitations    Rx / DC Orders ED Discharge Orders    None       Devonne Lalani, Gwenyth Allegra, MD 12/04/19 [phone]

## 2019-12-18 ENCOUNTER — Ambulatory Visit: Payer: Medicare HMO | Attending: Internal Medicine

## 2019-12-18 DIAGNOSIS — Z23 Encounter for immunization: Secondary | ICD-10-CM

## 2019-12-18 NOTE — Progress Notes (Signed)
   Covid-19 Vaccination Clinic  Name:  [name]    MRN: [id number] DOB: Jan 19, 1951  12/18/2019  [name] was observed post Covid-19 immunization for 30 minutes based on pre-vaccination screening without incident. She was provided with Vaccine Information Sheet and instruction to access the V-Safe system.   [name] was instructed to call [phone] with any severe reactions post vaccine: Marland Kitchen Difficulty breathing  . Swelling of face and throat  . A fast heartbeat  . A bad rash all over body  . Dizziness and weakness   Immunizations Administered    Name Date Dose VIS Date Route   Pfizer COVID-19 Vaccine 12/18/2019  4:09 PM 0.3 mL 08/31/2019 Intramuscular   Manufacturer: [place]   Lot: [id number]   [place]: [id number]

## 2020-02-05 IMAGING — RF DG ESOPHAGUS
8 series · 14 of 24 positions shown · non-contrast
Comparison: None.

CLINICAL DATA: Dysphagia.  Food getting caught in throat.

EXAM:
ESOPHOGRAM/BARIUM SWALLOW
TECHNIQUE: Combined double contrast and single contrast examination performed
using effervescent crystals, thick barium liquid, and thin barium
liquid.
FLUOROSCOPY TIME:  Fluoroscopy Time:  1 minutes and 6 seconds
Radiation Exposure Index (if provided by the fluoroscopic device):
48 mGy
Number of Acquired Spot Images: 0

[Series 1: fluoro_barium 2fps_bw · 0.17mm/px · 2 of 16 frames shown (1 of 3)]
[frame 3/16]
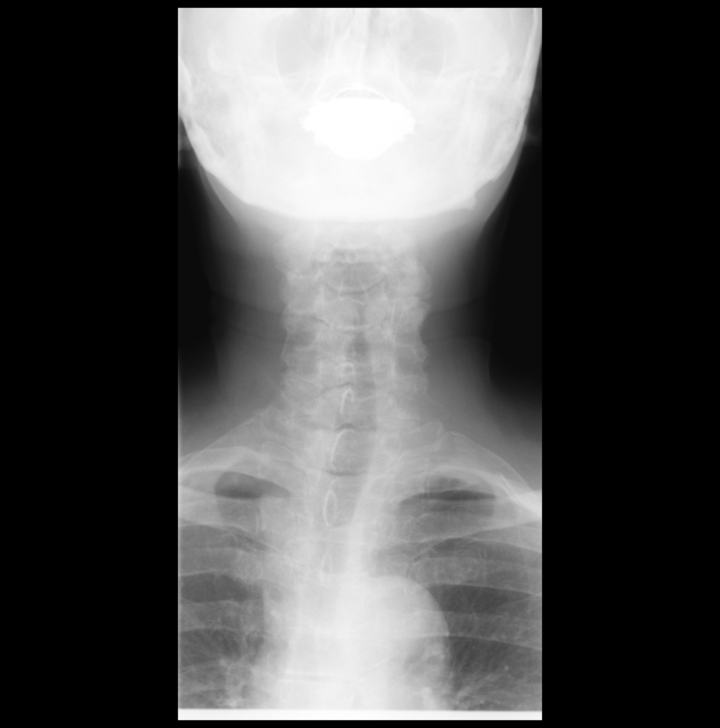
[frame 11/16]
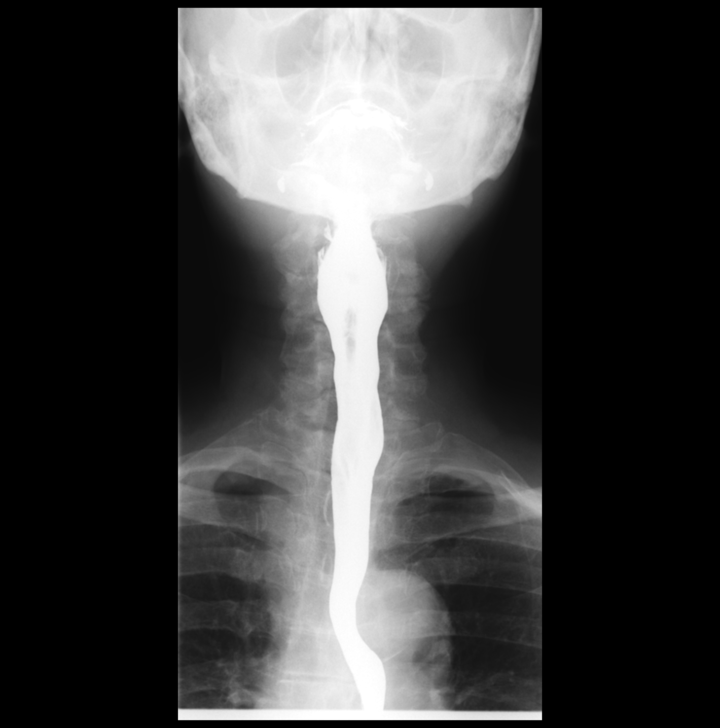

[Series 2: fluoro_barium 2fps_bw · 0.17mm/px · 2 of 16 frames shown (2 of 3)]
[frame 9/16]
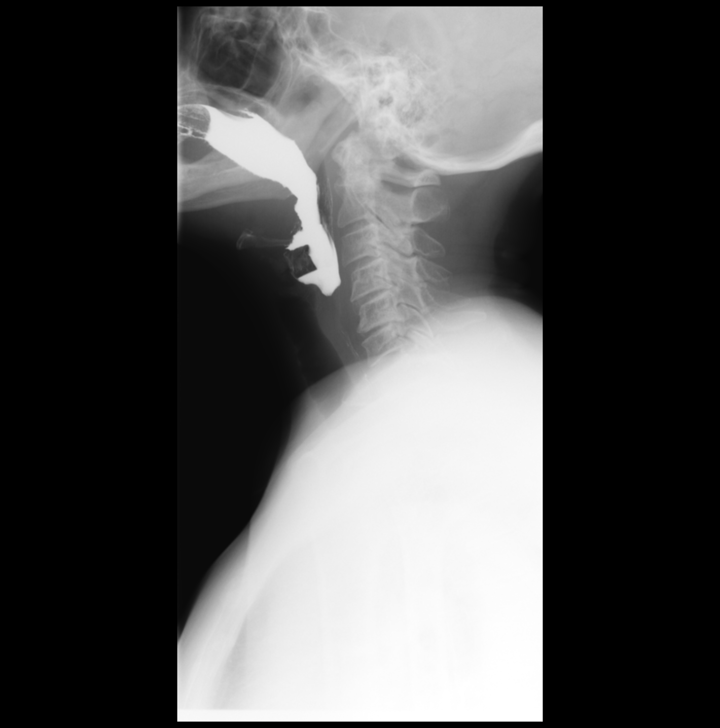
[frame 14/16]
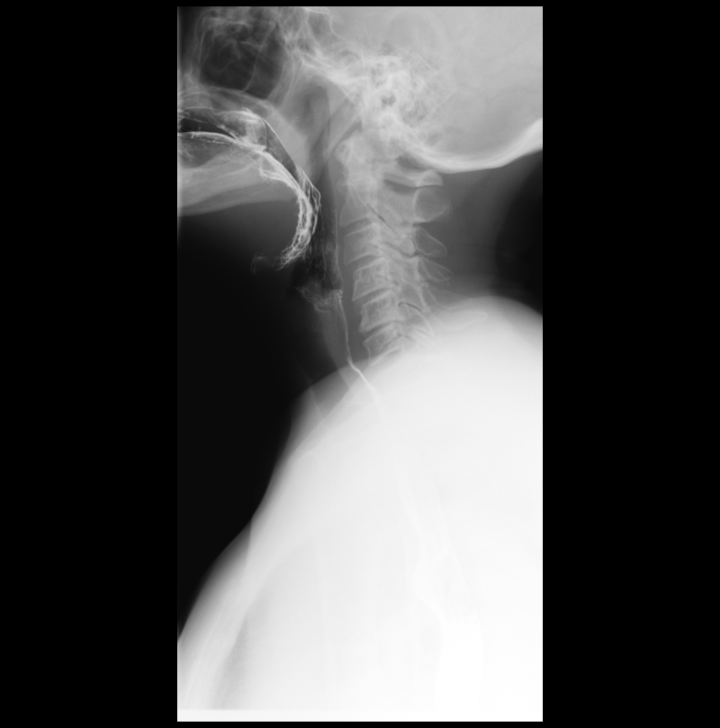

[Series 3: fluoro_barium 2fps_bw · 0.17mm/px · 2 of 31 frames shown (3 of 3)]
[frame 16/31]
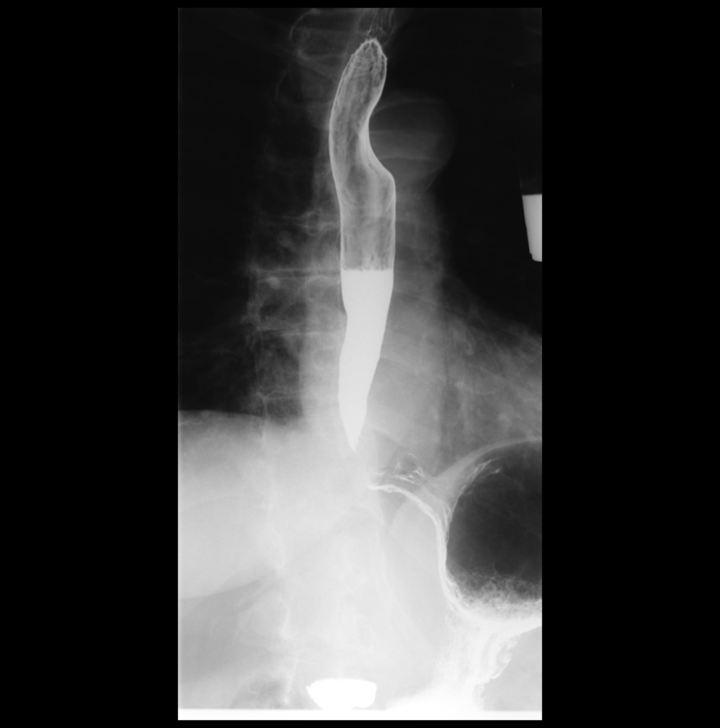
[frame 27/31]
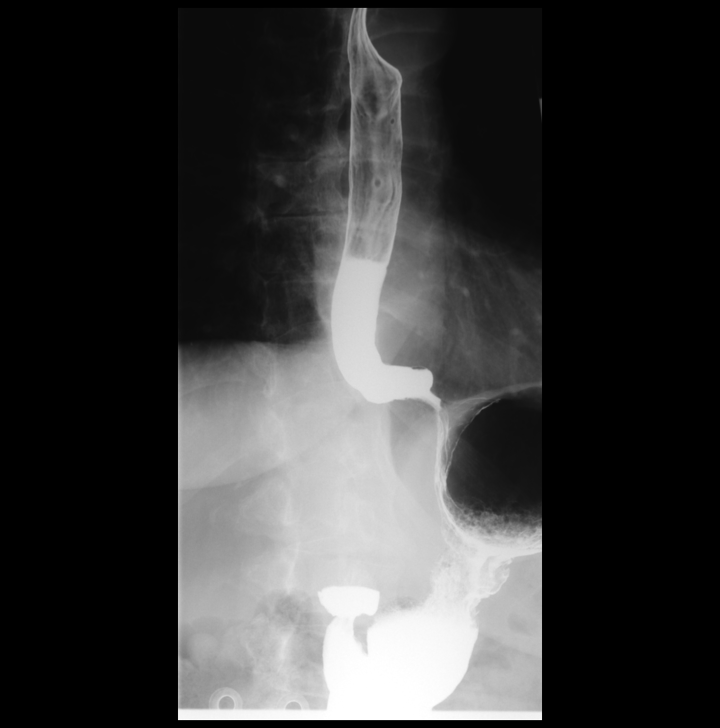

[Series 4: cp_standard · 0.51mm/px · 2 of 71 frames shown (1 of 5)]
[frame 36/71]
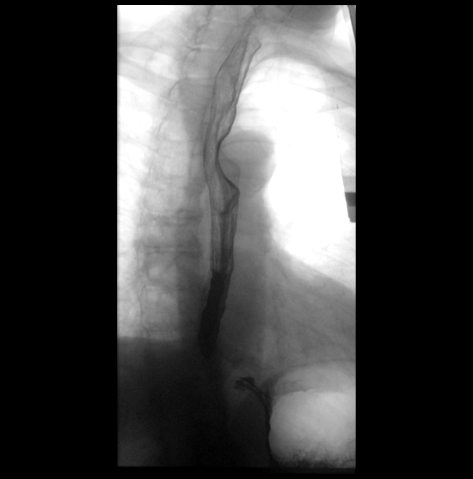
[frame 61/71]
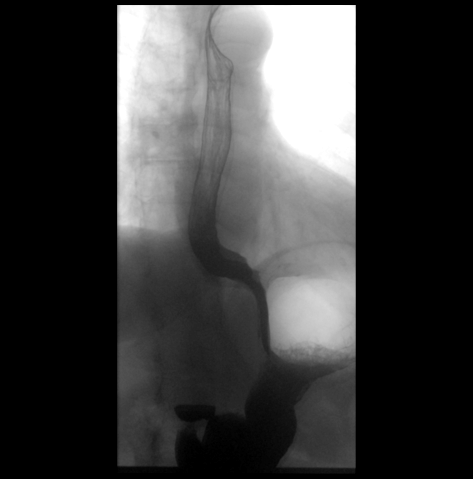

[Series 5: cp_standard · 0.53mm/px · 2 of 21 frames shown (2 of 5)]
[frame 4/21]
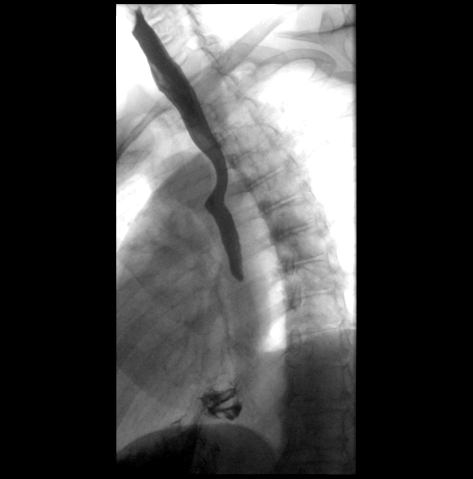
[frame 18/21]
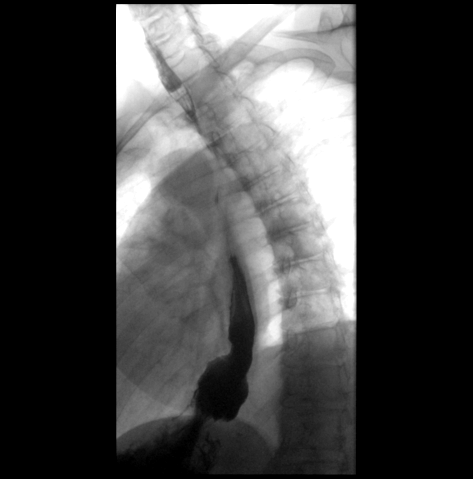

[Series 6: cp_standard · 0.53mm/px · 2 of 22 frames shown (3 of 5)]
[frame 12/22]
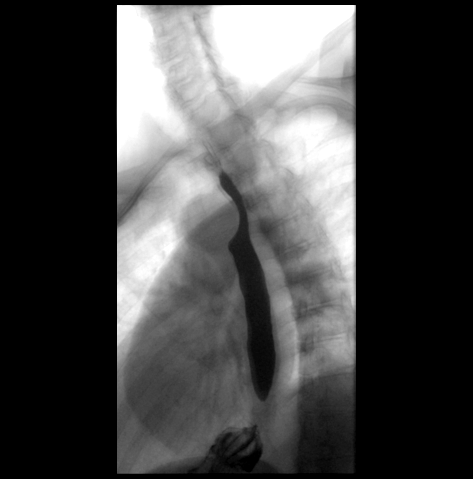
[frame 19/22]
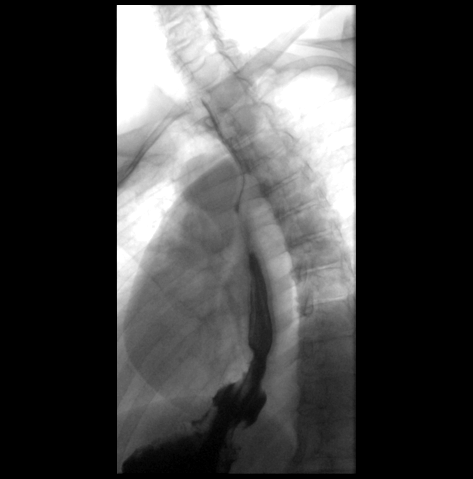

[Series 7: cp_standard · 0.53mm/px · 1 of 22 frames shown (4 of 5)]
[frame 19/22]
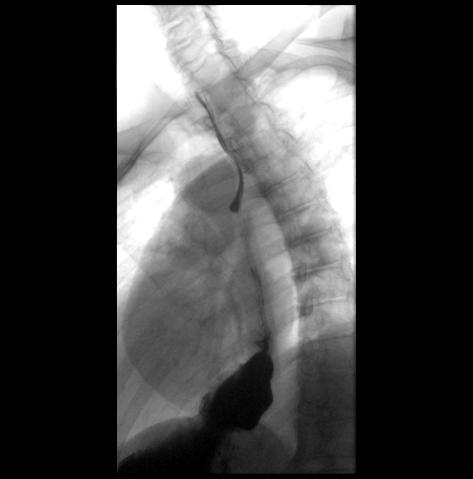

[Series 8: cp_standard · 0.27mm/px · 1 of 1 slices shown (5 of 5)]
[im 1/1]
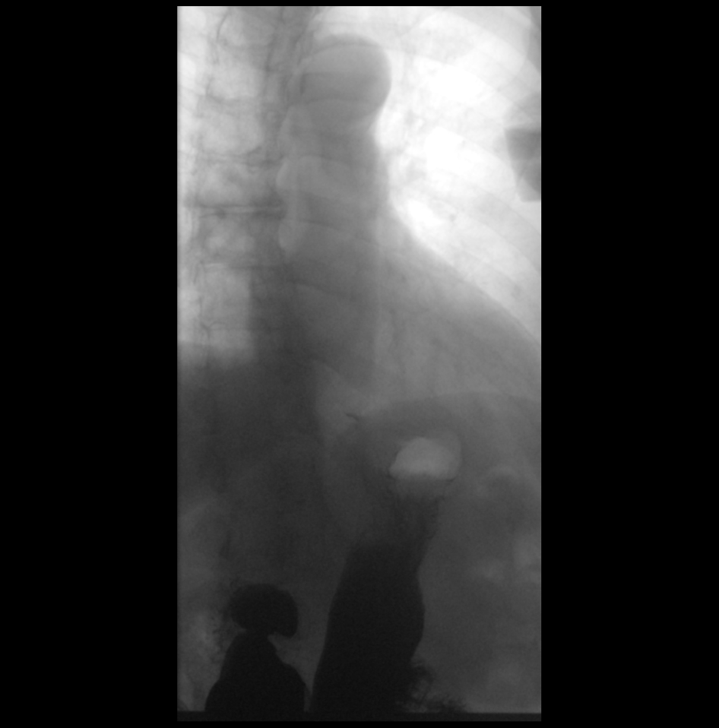

[14 of 24 positions shown; findings below may reference images not displayed]

FINDINGS: Frontal and lateral views of the hypopharynx while swallowing are
unremarkable.

Double contrast imaging of the esophagus shows no focal mass lesion,
diverticulum, stricture, or mucosal ulceration. Tiny sliding type
hiatal hernia evident.

Evaluation of esophageal motility reveals good preservation of the
primary peristaltic stripping wave. No tertiary contractions or
presbyesophagus observed.

13 mm barium tablet passes readily into the stomach when taken with
water. The patient did feel like the tablet stuck in her throat in
had a persistent sensation of the tablet being stuck in the upper
esophagus despite having already passed into the stomach.
IMPRESSION: 1. Unremarkable double contrast evaluation of the esophagus.
2. Tiny sliding type hiatal hernia.

## 2020-03-06 ENCOUNTER — Telehealth: Payer: Self-pay | Admitting: Oncology

## 2020-03-06 NOTE — Telephone Encounter (Signed)
Called pt per 6/16 sch message - unable to reach pt- left message for patient to call back to reschedule.

## 2020-03-13 ENCOUNTER — Inpatient Hospital Stay: Payer: Medicare HMO

## 2020-03-13 ENCOUNTER — Inpatient Hospital Stay: Payer: Medicare HMO | Admitting: Oncology

## 2020-04-28 NOTE — Progress Notes (Signed)
[place]  Telephone:(336) [phone] Fax:(336) [phone]     ID: [name] DOB: 12/18/1950  MR#: [id number]  WEX#:[id number]  Patient Care Team: Suella Broad, FNP as PCP - General (Nurse Practitioner) Skeet Latch, MD as PCP - Cardiology (Cardiology) Gavin Pound, MD as Consulting Physician (Rheumatology) Willia Craze, NP as Nurse Practitioner (Gastroenterology) Magrinat, Virgie Dad, MD as Consulting Physician (Oncology) Leanora Cover, MD as Consulting Physician (Orthopedic Surgery) OTHER MD:   CHIEF COMPLAINT: Estrogen receptor positive breast cancer  CURRENT TREATMENT: tamoxifen    INTERVAL HISTORY: [name] returns today for follow-up of her estrogen receptor positive breast cancer.   She continues on tamoxifen, with good tolerance. She reports hot flashes and vaginal wetness, but neither of these are bothersome.  Since her last visit, she has not undergone any additional studies. Her most recent mammography was on 03/07/2019 at [facility].  However she tells me that she has a lump in her left axilla that has been present for about 3 years.  It is tender.   REVIEW OF SYSTEMS: [name] had the [place] vaccine which she tolerated well.  Of course she lives by herself so that is not an issue.  She tells me her younger daughter and her son have been vaccinated but not the other daughter.  [name] just came back from 2 months in [place] (which is the reason she is behind on her mammography).  She lost 10 relatives last year to COVID-34.  [name]'s younger daughter did have a scare regarding breast cancer but all that came back fine and she is simply now being followed routinely through gynecology.  [name] is exercising chiefly by walking.  A detailed review of systems today was otherwise normal   BREAST CANCER HISTORY: From Dr. Dana Allan earlier summary:  "#1 patient is status post lumpectomy with sentinel lymph node biopsy followed  by radiation to the right breast. In December 2009 patient began aromatase inhibitor but could not tolerate it.  #2 therefore she began in March 2010 tamoxifen 20 mg daily. However she has experienced significant amount of aches and pains and in December 2012 discontinued it. At this time she does not want to go back on any kind of antiestrogen therapy due to side effects.  #3 patient would like to go back on tamoxifen 20 mg daily. She recently attended finding your new normal seminar. And because of that she wants to she would like to go back on the tamoxifen. We discussed side effects again. She was sent to her pharmacy."  Her subsequent history is as detailed below   PAST MEDICAL HISTORY: Past Medical History:  Diagnosis Date  . Arthritis    R knee, hands   . Blood transfusion    1986- post-partial hysterectomy  . Breast cancer [facility]) 2009   right lumpectomy and radiation  . Chronic kidney disease   . Colon cancer [facility]) 2003   per pt report, cancer found in a colon polyp in 2003, procedure done in [place].  did not require surgery, chemo etc.    . Family history of breast cancer 10/24/2017  . Gastritis   . GERD (gastroesophageal reflux disease)    recently taken off anti reflux tx  . Heart murmur    functional murmur, echo in [place]- long time ago   . Hepatitis    Hep. A- 1978  . Hiatal hernia   . Hypertension   . Lupus ([place])   . Personal history of radiation therapy  2009  . Renal disorder   . Syncope and collapse     PAST SURGICAL HISTORY: Past Surgical History:  Procedure Laterality Date  . ABDOMINAL HYSTERECTOMY     1986  . APPENDECTOMY     1978 & tubal ligation   . BIOPSY  07/25/2018   Procedure: BIOPSY;  Surgeon: Milus Banister, MD;  Location: [facility] ENDOSCOPY;  Service: Endoscopy;;  . BREAST EXCISIONAL BIOPSY Left 1998  . BREAST SURGERY     R breast lumpectomy  . CHOLECYSTECTOMY     1978- open   . ESOPHAGOGASTRODUODENOSCOPY (EGD) WITH PROPOFOL N/A 07/25/2018   Procedure:  ESOPHAGOGASTRODUODENOSCOPY (EGD) WITH PROPOFOL;  Surgeon: Milus Banister, MD;  Location: [facility] ENDOSCOPY;  Service: Endoscopy;  Laterality: N/A;  . REPLACEMENT TOTAL KNEE Left   . TONSILLECTOMY     1954  . TOTAL KNEE ARTHROPLASTY  08/30/2011   Procedure: TOTAL KNEE ARTHROPLASTY;  Surgeon: Kerin Salen;  Location: [place];  Service: Orthopedics;  Laterality: Right;  Right Total Knee Arthroplasty  . TUBAL LIGATION      FAMILY HISTORY Family History  Problem Relation Age of Onset  . Heart attack Father   . Cancer Other   . Hypertension Other   . Breast cancer Mother 33  . Hypertension Mother   . Hypertension Brother   . Kidney disease Brother   . Breast cancer Maternal Aunt 15  . Cancer Paternal Aunt 29       type unk  . Other Maternal Grandmother 102       brain tumor dx at 77- did not bx or do additional testing on tumor  . Cancer Paternal Grandmother 58       type unk  . Lung cancer Paternal Grandmother   . Anesthesia problems Neg Hx   . Hypotension Neg Hx   . Malignant hyperthermia Neg Hx   . Pseudochol deficiency Neg Hx   . Colon cancer Neg Hx   . Esophageal cancer Neg Hx   . Stomach cancer Neg Hx   . Pancreatic cancer Neg Hx   . Liver disease Neg Hx    the patient's father died from a heart attack at the age of 88. The patient's mother died at the age of 66. She had a history of breast cancer and one of her sisters was diagnosed with breast cancer at the age of 63. The patient had 5 brothers and 5 sisters. There is no other history of breast or ovarian cancer in the family to her knowledge.   GYNECOLOGIC HISTORY:  No LMP recorded. Patient has had a hysterectomy. Menarche age 43, first live birth age 35. The patient is GX P3. She had a simple hysterectomy in 1986 (no salpingo-oophorectomy).   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 She remarried in 2011. Her husband [name] is retired  from [employer]. The patient has one adopted son in addition to her own children. 3 of her children live in town 1 in [place]. She has 7 grandchildren. She attends [employer] locally.    ADVANCED DIRECTIVES: Not in place   HEALTH MAINTENANCE: Social History   Tobacco Use  . Smoking status: Never Smoker  . Smokeless tobacco: Never Used  Vaping Use  . Vaping Use: Never used  Substance Use Topics  . Alcohol use: No  . Drug use: No  Colonoscopy:  PAP:  Bone density: 04/30/2008 was normal  Lipid panel:  Allergies  Allergen Reactions  . Iodine Anaphylaxis  . Iohexol Anaphylaxis     Code: HIVES, Desc: PT IS ALLERGIC TO IODINATED CONTRAST; REACTION INCLUDES FACIAL SWELLING,HIVES,RESPIRATORY DISTRESS;PT HASN'T HAD SCAN W/PREMEDS.;REFUSED CONTRAST OF ANY KIND!  KR   . Latex Anaphylaxis  . Penicillins Anaphylaxis and Rash    Has patient had a PCN reaction causing immediate rash, facial/tongue/throat swelling, SOB or lightheadedness with hypotension: Yes Has patient had a PCN reaction causing severe rash involving mucus membranes or skin necrosis: Yes Has patient had a PCN reaction that required hospitalization: No Has patient had a PCN reaction occurring within the last 10 years: No If all of the above answers are "NO", then may proceed with Cephalosporin use.   . Tetracycline Anaphylaxis    Current Outpatient Medications  Medication Sig Dispense Refill  . busPIRone (BUSPAR) 5 MG tablet Take 10 mg by mouth 3 (three) times daily as needed (anxiety).   3  . cholecalciferol (VITAMIN D) 1000 units tablet Take 1,000 Units by mouth daily.    . ciprofloxacin (CIPRO) 250 MG tablet Take 1 tablet (250 mg total) by mouth 2 (two) times daily. (Patient not taking: Reported on 12/04/2019) 10 tablet 0  . fluconazole (DIFLUCAN) 150 MG tablet Take 1 tablet (150 mg total) by mouth daily. May repeat x 1 if needed (Patient not taking: Reported on 12/04/2019) 2  tablet 0  . hydrochlorothiazide (HYDRODIURIL) 25 MG tablet Take 25 mg by mouth daily.     Marland Kitchen ibuprofen (ADVIL) 800 MG tablet Take 800 mg by mouth every 8 (eight) hours as needed.    . metoprolol succinate (TOPROL-XL) 25 MG 24 hr tablet Take 2 tablets (50 mg total) by mouth every morning. 60 tablet 1  . Multiple Vitamins-Minerals (EMERGEN-C IMMUNE PLUS PO) Take 1 tablet by mouth daily.    Marland Kitchen omeprazole (PRILOSEC) 40 MG capsule Take 1 capsule (40 mg total) by mouth daily. 30 capsule 5  . potassium chloride (KLOR-CON) 10 MEQ tablet Take 1 tablet (10 mEq total) by mouth daily. 90 tablet 4  . tamoxifen (NOLVADEX) 20 MG tablet Take 1 tablet (20 mg total) by mouth daily. 90 tablet 4   No current facility-administered medications for this visit.    OBJECTIVE: African-American woman in no acute distress Vitals:   04/29/20 1206  BP: 132/87  Pulse: 64  Resp: 16  Temp: 98.6 F (37 C)  SpO2: 99%     Body mass index is 31.56 kg/m.    ECOG FS:1 - Symptomatic but completely ambulatory   Sclerae unicteric, EOMs intact Wearing a mask No cervical or supraclavicular adenopathy Lungs no rales or rhonchi Heart regular rate and rhythm Abd soft, nontender, positive bowel sounds MSK no focal spinal tenderness, no upper extremity lymphedema Neuro: nonfocal, well oriented, appropriate affect Breasts: The right breast is unremarkable.  The left breast is status post lumpectomy and radiation, with no evidence of local recurrence.  In the left axilla there is a 3 mm flat subcutaneous nonerythematous nodule consistent with a small cyst or scar, but does need evaluation.   LAB RESULTS:  CMP     Component Value Date/Time   NA 135 12/03/2019 2008   NA 141 01/26/2017 1508   K 3.9 12/03/2019 2008   K 3.4 (L) 01/26/2017 1508   CL 102 12/03/2019 2008   CL 102 01/25/2013 0909   CO2 24 12/03/2019 2008   CO2 29 01/26/2017 1508  GLUCOSE 104 (H) 12/03/2019 2008   GLUCOSE 91 01/26/2017 1508   GLUCOSE 81  01/25/2013 0909   BUN 12 12/03/2019 2008   BUN 11.5 01/26/2017 1508   CREATININE 0.74 12/03/2019 2008   CREATININE 1.04 (H) 02/15/2019 1436   CREATININE 0.8 01/26/2017 1508   CALCIUM 9.0 12/03/2019 2008   CALCIUM 9.5 01/26/2017 1508   PROT 7.3 08/20/2019 1442   PROT 7.1 01/26/2017 1508   ALBUMIN 3.6 08/20/2019 1442   ALBUMIN 3.5 01/26/2017 1508   AST 25 08/20/2019 1442   AST 24 02/15/2019 1436   AST 22 01/26/2017 1508   ALT 19 08/20/2019 1442   ALT 17 02/15/2019 1436   ALT 17 01/26/2017 1508   ALKPHOS 101 08/20/2019 1442   ALKPHOS 72 01/26/2017 1508   BILITOT <0.2 (L) 08/20/2019 1442   BILITOT 0.3 02/15/2019 1436   BILITOT 0.38 01/26/2017 1508   GFRNONAA >60 12/03/2019 2008   GFRNONAA 54 (L) 02/15/2019 1436   GFRAA >60 12/03/2019 2008   GFRAA >60 02/15/2019 1436    INo results found for: SPEP, UPEP  Lab Results  Component Value Date   WBC 5.5 04/29/2020   NEUTROABS 1.7 04/29/2020   HGB 12.5 04/29/2020   HCT 37.9 04/29/2020   MCV 93.6 04/29/2020   PLT 200 04/29/2020      Chemistry      Component Value Date/Time   NA 135 12/03/2019 2008   NA 141 01/26/2017 1508   K 3.9 12/03/2019 2008   K 3.4 (L) 01/26/2017 1508   CL 102 12/03/2019 2008   CL 102 01/25/2013 0909   CO2 24 12/03/2019 2008   CO2 29 01/26/2017 1508   BUN 12 12/03/2019 2008   BUN 11.5 01/26/2017 1508   CREATININE 0.74 12/03/2019 2008   CREATININE 1.04 (H) 02/15/2019 1436   CREATININE 0.8 01/26/2017 1508      Component Value Date/Time   CALCIUM 9.0 12/03/2019 2008   CALCIUM 9.5 01/26/2017 1508   ALKPHOS 101 08/20/2019 1442   ALKPHOS 72 01/26/2017 1508   AST 25 08/20/2019 1442   AST 24 02/15/2019 1436   AST 22 01/26/2017 1508   ALT 19 08/20/2019 1442   ALT 17 02/15/2019 1436   ALT 17 01/26/2017 1508   BILITOT <0.2 (L) 08/20/2019 1442   BILITOT 0.3 02/15/2019 1436   BILITOT 0.38 01/26/2017 1508       Lab Results  Component Value Date   LABCA2 16 11/09/2010    No components found  for: [id number]  No results for input(s): INR in the last 168 hours.  Urinalysis    Component Value Date/Time   COLORURINE YELLOW 11/01/2018 1003   APPEARANCEUR HAZY (A) 11/01/2018 1003   LABSPEC 1.017 11/01/2018 1003   LABSPEC 1.015 01/26/2017 1523   PHURINE 5.0 11/01/2018 1003   GLUCOSEU NEGATIVE 11/01/2018 1003   GLUCOSEU Negative 01/26/2017 1523   HGBUR SMALL (A) 11/01/2018 1003   HGBUR negative 01/22/2010 1551   BILIRUBINUR NEGATIVE 11/01/2018 1003   BILIRUBINUR Negative 01/26/2017 1523   KETONESUR NEGATIVE 11/01/2018 1003   PROTEINUR NEGATIVE 11/01/2018 1003   UROBILINOGEN 0.2 01/26/2017 1523   NITRITE NEGATIVE 11/01/2018 1003   LEUKOCYTESUR SMALL (A) 11/01/2018 1003   LEUKOCYTESUR Small 01/26/2017 1523    STUDIES: No results found.   ASSESSMENT: 69 y.o. 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  (2) status post right  lumpectomy 03/20/2008 for invasive lobular carcinoma, multifocal, pT1a pNX, stage IA, grade 2, strongly estrogen and progesterone receptor positive, HER-2 negative. with negative margins.  (3) status post right axillary sentinel lymph node sampling 04/17/2008, the single sentinel lymph node being clear  (4) Oncotype score of 10 predicts an outside the breast risk of recurrence of 7% if the patient's only systemic therapy is tamoxifen for 5 years.  (5) Additional right breast biopsy 05/23/2008 to evaluate microcalcifications seen on pre-radiotherapy mammography showed only atypical lobular hyperplasia  (6) Completed radiation to the right breast 07/26/2008 (50 gray +14 gray boost).  (7) Started on anastrozole November 2009, with poor tolerance  (8). Started on tamoxifen March 2010, continued to December 2012, resumed December 2014   (a)  The plan is to continue tamoxifen through March  2022 (at least--she would like to take for the rest of her life)  (9) status post simple hysterectomy 1986 (no salpingo-oophorectomy)  (10) genetics testing 10/31/2017 through the Common Hereditary Cancers Panel offered by [facility] found no deleterious mutations in APC, ATM, AXIN2, BARD1, BMPR1A, BRCA1, BRCA2, BRIP1, CDH1, CDKN2A (p14ARF), CDKN2A (p16INK4a), CKD4, CHEK2, CTNNA1, DICER1, EPCAM (Deletion/duplication testing only), GREM1 (promoter region deletion/duplication testing only), KIT, MEN1, MLH1, MSH2, MSH3, MSH6, MUTYH, NBN, NF1, NHTL1, PALB2, PDGFRA, PMS2, POLD1, POLE, PTEN, RAD50, RAD51C, RAD51D, SDHB, SDHC, SDHD, SMAD4, SMARCA4. STK11, TP53, TSC1, TSC2, and VHL.  The following genes were evaluated for sequence changes only: SDHA and HOXB13 c.251G>A variant only.  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    PLAN: [name] is now a little over 11 years out from definitive surgery for breast cancer with no evidence of disease recurrence.  This is very favorable.  She is tolerating tamoxifen well.  The plan is to continue it through 2022 and decide at that point where they are to discontinue it or not as the patient is interested in continuing indefinitely.  She is a little bit behind on her mammography and I am setting her up for that later this month.  We will obtain a left axillary ultrasound at the same time  Otherwise she will return to see me in 6 months, which is her strong preference.  I have encouraged her to continue to exercise and also to make sure the rest of her family gets vaccinated.  Total encounter time 25 minutes.Sarajane Jews C. Magrinat, MD 04/29/20 12:09 PM Medical Oncology and Hematology [facility] [place], [place] [address] Tel. [phone]    Fax. [phone]   I, Wilburn Mylar, am acting as scribe for Dr. Virgie Dad. Magrinat.  I, Lurline Del MD, have reviewed the above  documentation for accuracy and completeness, and I agree with the above.   *Total Encounter Time as defined by the Centers for Medicare and Medicaid Services includes, in addition to the face-to-face time of a patient visit (documented in the note above) non-face-to-face time: obtaining and reviewing outside history, ordering and reviewing medications, tests or procedures, care coordination (communications with other health care professionals or caregivers) and documentation in the medical record.

## 2020-04-29 ENCOUNTER — Inpatient Hospital Stay: Payer: Medicare HMO | Attending: Oncology

## 2020-04-29 ENCOUNTER — Other Ambulatory Visit: Payer: Self-pay | Admitting: *Deleted

## 2020-04-29 ENCOUNTER — Other Ambulatory Visit: Payer: Self-pay

## 2020-04-29 ENCOUNTER — Inpatient Hospital Stay: Payer: Medicare HMO | Admitting: Oncology

## 2020-04-29 VITALS — BP 132/87 | HR 64 | Temp 98.6°F | Resp 16 | Ht 67.0 in | Wt 201.5 lb

## 2020-04-29 DIAGNOSIS — Z801 Family history of malignant neoplasm of trachea, bronchus and lung: Secondary | ICD-10-CM | POA: Insufficient documentation

## 2020-04-29 DIAGNOSIS — R222 Localized swelling, mass and lump, trunk: Secondary | ICD-10-CM | POA: Insufficient documentation

## 2020-04-29 DIAGNOSIS — Z888 Allergy status to other drugs, medicaments and biological substances status: Secondary | ICD-10-CM | POA: Diagnosis not present

## 2020-04-29 DIAGNOSIS — C50411 Malignant neoplasm of upper-outer quadrant of right female breast: Secondary | ICD-10-CM | POA: Diagnosis not present

## 2020-04-29 DIAGNOSIS — Z881 Allergy status to other antibiotic agents status: Secondary | ICD-10-CM | POA: Diagnosis not present

## 2020-04-29 DIAGNOSIS — Z923 Personal history of irradiation: Secondary | ICD-10-CM | POA: Diagnosis not present

## 2020-04-29 DIAGNOSIS — Z8249 Family history of ischemic heart disease and other diseases of the circulatory system: Secondary | ICD-10-CM | POA: Diagnosis not present

## 2020-04-29 DIAGNOSIS — Z170 Estrogen receptor positive status [ER+]: Secondary | ICD-10-CM | POA: Diagnosis not present

## 2020-04-29 DIAGNOSIS — Z85038 Personal history of other malignant neoplasm of large intestine: Secondary | ICD-10-CM | POA: Diagnosis not present

## 2020-04-29 DIAGNOSIS — Z880 Allergy status to penicillin: Secondary | ICD-10-CM | POA: Insufficient documentation

## 2020-04-29 DIAGNOSIS — C50912 Malignant neoplasm of unspecified site of left female breast: Secondary | ICD-10-CM

## 2020-04-29 DIAGNOSIS — Z803 Family history of malignant neoplasm of breast: Secondary | ICD-10-CM | POA: Diagnosis not present

## 2020-04-29 DIAGNOSIS — Z853 Personal history of malignant neoplasm of breast: Secondary | ICD-10-CM | POA: Diagnosis present

## 2020-04-29 DIAGNOSIS — Z9049 Acquired absence of other specified parts of digestive tract: Secondary | ICD-10-CM | POA: Diagnosis not present

## 2020-04-29 DIAGNOSIS — R232 Flushing: Secondary | ICD-10-CM | POA: Insufficient documentation

## 2020-04-29 DIAGNOSIS — Z79899 Other long term (current) drug therapy: Secondary | ICD-10-CM | POA: Diagnosis not present

## 2020-04-29 DIAGNOSIS — Z79810 Long term (current) use of selective estrogen receptor modulators (SERMs): Secondary | ICD-10-CM | POA: Diagnosis not present

## 2020-04-29 DIAGNOSIS — R300 Dysuria: Secondary | ICD-10-CM

## 2020-04-29 DIAGNOSIS — Z8719 Personal history of other diseases of the digestive system: Secondary | ICD-10-CM | POA: Diagnosis not present

## 2020-04-29 LAB — CBC WITH DIFFERENTIAL/PLATELET
Abs Immature Granulocytes: 0.02 10*3/uL (ref 0.00–0.07)
Basophils Absolute: 0 10*3/uL (ref 0.0–0.1)
Basophils Relative: 0 %
Eosinophils Absolute: 0.1 10*3/uL (ref 0.0–0.5)
Eosinophils Relative: 3 %
HCT: 37.9 % (ref 36.0–46.0)
Hemoglobin: 12.5 g/dL (ref 12.0–15.0)
Immature Granulocytes: 0 %
Lymphocytes Relative: 50 %
Lymphs Abs: 2.8 10*3/uL (ref 0.7–4.0)
MCH: 30.9 pg (ref 26.0–34.0)
MCHC: 33 g/dL (ref 30.0–36.0)
MCV: 93.6 fL (ref 80.0–100.0)
Monocytes Absolute: 0.8 10*3/uL (ref 0.1–1.0)
Monocytes Relative: 15 %
Neutro Abs: 1.7 10*3/uL (ref 1.7–7.7)
Neutrophils Relative %: 32 %
Platelets: 200 10*3/uL (ref 150–400)
RBC: 4.05 MIL/uL (ref 3.87–5.11)
RDW: 13 % (ref 11.5–15.5)
WBC: 5.5 10*3/uL (ref 4.0–10.5)
nRBC: 0 % (ref 0.0–0.2)

## 2020-04-29 LAB — URINALYSIS, COMPLETE (UACMP) WITH MICROSCOPIC
Bilirubin Urine: NEGATIVE
Glucose, UA: NEGATIVE mg/dL
Ketones, ur: NEGATIVE mg/dL
Leukocytes,Ua: NEGATIVE
Nitrite: NEGATIVE
Protein, ur: NEGATIVE mg/dL
Specific Gravity, Urine: 1.021 (ref 1.005–1.030)
pH: 6 (ref 5.0–8.0)

## 2020-04-29 LAB — COMPREHENSIVE METABOLIC PANEL
ALT: 13 U/L (ref 0–44)
AST: 21 U/L (ref 15–41)
Albumin: 3.6 g/dL (ref 3.5–5.0)
Alkaline Phosphatase: 90 U/L (ref 38–126)
Anion gap: 9 (ref 5–15)
BUN: 12 mg/dL (ref 8–23)
CO2: 25 mmol/L (ref 22–32)
Calcium: 10 mg/dL (ref 8.9–10.3)
Chloride: 107 mmol/L (ref 98–111)
Creatinine, Ser: 0.85 mg/dL (ref 0.44–1.00)
GFR calc Af Amer: >60 mL/min (ref >60)
GFR calc non Af Amer: >60 mL/min (ref >60)
Glucose, Bld: 85 mg/dL (ref 70–99)
Potassium: 4 mmol/L (ref 3.5–5.1)
Sodium: 141 mmol/L (ref 135–145)
Total Bilirubin: 0.4 mg/dL (ref 0.3–1.2)
Total Protein: 7.4 g/dL (ref 6.5–8.1)

## 2020-04-29 LAB — HEMOGLOBIN A1C
Hgb A1c MFr Bld: 5.6 % (ref 4.8–5.6)
Mean Plasma Glucose: 114.02 mg/dL

## 2020-04-29 MED ORDER — TAMOXIFEN CITRATE 20 MG PO TABS: 20.0000 mg | ORAL_TABLET | Freq: Every day | ORAL | 4 refills | Status: DC

## 2020-04-30 LAB — URINE CULTURE: Culture: <10000 — AB

## 2020-05-06 ENCOUNTER — Other Ambulatory Visit: Payer: Self-pay | Admitting: Oncology

## 2020-05-06 DIAGNOSIS — C50411 Malignant neoplasm of upper-outer quadrant of right female breast: Secondary | ICD-10-CM

## 2020-05-06 DIAGNOSIS — Z170 Estrogen receptor positive status [ER+]: Secondary | ICD-10-CM

## 2020-05-09 ENCOUNTER — Ambulatory Visit: Payer: Medicare HMO

## 2020-05-09 ENCOUNTER — Other Ambulatory Visit: Payer: Self-pay | Admitting: Oncology

## 2020-05-09 DIAGNOSIS — C50411 Malignant neoplasm of upper-outer quadrant of right female breast: Secondary | ICD-10-CM

## 2020-05-09 DIAGNOSIS — Z170 Estrogen receptor positive status [ER+]: Secondary | ICD-10-CM

## 2020-05-12 ENCOUNTER — Ambulatory Visit
Admission: RE | Admit: 2020-05-12 | Discharge: 2020-05-12 | Disposition: A | Discharge status: Home / Self Care | Payer: Medicare HMO | Source: Ambulatory Visit | Attending: Oncology | Admitting: Oncology

## 2020-05-12 ENCOUNTER — Other Ambulatory Visit: Payer: Self-pay

## 2020-05-12 DIAGNOSIS — C50411 Malignant neoplasm of upper-outer quadrant of right female breast: Secondary | ICD-10-CM

## 2020-05-12 DIAGNOSIS — Z170 Estrogen receptor positive status [ER+]: Secondary | ICD-10-CM

## 2020-08-01 ENCOUNTER — Telehealth: Payer: Self-pay | Admitting: Cardiovascular Disease

## 2020-08-01 NOTE — Telephone Encounter (Signed)
Returned call to patient-she reports concerns of elevated blood pressure.   She states she went to [facility] 10/29 due to high BP and was started on Losartan 25 mg daily.  Since the medication has been added , she has noticed no change in her blood pressure.   Blood pressure readings in previous message.  Currently 171/100 HR 63.   Taking Toprol 50 mg daily and losartan 25 daily  Denies HA, blurred vision, chest pain, SOB.  Lightheadedness "sometimes".   She states she is currently watching her salt intake.     Last OV 2019-upcoming visit in December.     Routed to pharmd to assist with medication changes, will reschedule for sooner appt or pharmD Htn clinic appt.

## 2020-08-01 NOTE — Telephone Encounter (Signed)
Pt c/o BP issue: STAT if pt c/o blurred vision, one-sided weakness or slurred speech  1. What are your last 5 BP readings?  145/80 167/97 150/92 165/95  2. Are you having any other symptoms (ex. Dizziness, headache, blurred vision, passed out)?  Sometimes a little lightheaded   3. What is your BP issue?  Elevated BP - went to urgent care around October 29th and they started patient on losartan 25 MG 1 time per day   Please call/advise.   Thank you

## 2020-08-01 NOTE — Telephone Encounter (Signed)
Spoke with patient.  She went to [facility] for high BP.   They stopped hctz 25 mg and added losartan 25 mg.  Patient notes no changes in home BP readings. Has not been seen in Cardiology since 2019, scheduled with Kerin Ransom for mid-December.    Advised patient to move losartan 25 mg to evenings and resume hctz 25 mg in the mornings.  She should check home BP twice daily and work on lifestyle modifications (sodium restriction, weight loss, exercise).  Asked that she call in 2 weeks to report BP readings, will send lab order for BMET at that time.

## 2020-08-27 ENCOUNTER — Ambulatory Visit: Payer: Medicare HMO | Admitting: Cardiology

## 2020-09-04 ENCOUNTER — Ambulatory Visit: Payer: Medicare HMO | Admitting: Cardiology

## 2020-09-04 ENCOUNTER — Other Ambulatory Visit: Payer: Self-pay

## 2020-09-04 ENCOUNTER — Encounter: Payer: Self-pay | Admitting: Cardiology

## 2020-09-04 VITALS — BP 156/72 | HR 61 | Ht 67.0 in | Wt 206.6 lb

## 2020-09-04 DIAGNOSIS — Z853 Personal history of malignant neoplasm of breast: Secondary | ICD-10-CM | POA: Diagnosis not present

## 2020-09-04 DIAGNOSIS — I10 Essential (primary) hypertension: Secondary | ICD-10-CM

## 2020-09-04 DIAGNOSIS — Z8249 Family history of ischemic heart disease and other diseases of the circulatory system: Secondary | ICD-10-CM

## 2020-09-04 DIAGNOSIS — M329 Systemic lupus erythematosus, unspecified: Secondary | ICD-10-CM

## 2020-09-04 DIAGNOSIS — R002 Palpitations: Secondary | ICD-10-CM

## 2020-09-04 DIAGNOSIS — E669 Obesity, unspecified: Secondary | ICD-10-CM

## 2020-09-04 LAB — BASIC METABOLIC PANEL
BUN/Creatinine Ratio: 18 (ref 12–28)
BUN: 14 mg/dL (ref 8–27)
CO2: 24 mmol/L (ref 20–29)
Calcium: 9.6 mg/dL (ref 8.7–10.3)
Chloride: 102 mmol/L (ref 96–106)
Creatinine, Ser: 0.76 mg/dL (ref 0.57–1.00)
GFR calc Af Amer: 91 mL/min/{1.73_m2} (ref >59)
GFR calc non Af Amer: 79 mL/min/{1.73_m2} (ref >59)
Glucose: 80 mg/dL (ref 65–99)
Potassium: 4.2 mmol/L (ref 3.5–5.2)
Sodium: 140 mmol/L (ref 134–144)

## 2020-09-04 NOTE — Assessment & Plan Note (Signed)
Father had an MI in his 69's

## 2020-09-04 NOTE — Patient Instructions (Signed)
Medication Instructions:  Continue current medications  *If you need a refill on your cardiac medications before your next appointment, please call your pharmacy*   Lab Work: BMP    Testing/Procedures: None Ordered   Follow-Up: At [facility], you and your health needs are our priority.  As part of our continuing mission to provide you with exceptional heart care, we have created designated Provider Care Teams.  These Care Teams include your primary Cardiologist (physician) and Advanced Practice Providers (APPs -  Physician Assistants and Nurse Practitioners) who all work together to provide you with the care you need, when you need it.  We recommend signing up for the patient portal called "MyChart".  Sign up information is provided on this After Visit Summary.  MyChart is used to connect with patients for Virtual Visits (Telemedicine).  Patients are able to view lab/test results, encounter notes, upcoming appointments, etc.  Non-urgent messages can be sent to your provider as well.   To learn more about what you can do with MyChart, go to [email or web address].    Your next appointment:   Monday January 10th @ 9:15 am  The format for your next appointment:   Virtual Visit   Provider:   You will see one of the following Advanced Practice Providers on your designated Care Team:    Kerin Ransom, [place]

## 2020-09-04 NOTE — Progress Notes (Signed)
Cardiology Office Note:    Date:  09/04/2020   ID:  [name], DOB [birth year], MRN [id number]  PCP:  Suella Broad, FNP  Cardiologist:  Skeet Latch, MD  Electrophysiologist:  None   Referring MD: Suella Broad   No chief complaint on file.   History of Present Illness:    [name] is a 69 y.o. female with a hx of HTN and breast cancer.  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  Dr. Oval Linsey saw her July 11, 2018 with palpitations.  Patient does have a family history of coronary disease, her father had an MI in his 69s.  Dr. Oval Linsey ordered a Holter monitor and echo.  Echo done 07/20/2018 showed EF of 55 to 60% with mild concentric LVH.  There was grade 1 diastolic dysfunction. She was treated with beta blocker.   The patient then presented to the emergency room via EMS on 07/23/2018 [place].  The patient had complained of abdominal pain.  In the emergency room her troponins were negative.  Her EKG showed sinus rhythm without acute changes.  Telemetry showed no arrhythmias.  Her d-dimer was elevated- VQ scan was negative.  CT scan of her abdomen without contrast was negative.  She ultimately underwent GI evaluation which showed gastritis.  She was placed on PPI.  We have not seen her since nov 2019.  She presents today for evaluation of blood pressure.  She says a couple weeks ago she happened to randomly take her blood pressure and got a reading of 190/120.  She tells me she was not having symptoms.  She followed her blood pressure for the next week or so and it remained elevated with systolics in the 235T.  She went to [facility] and her medications were adjusted.  She was initially told to stop her HCTZ and start losartan but she was reluctant to do this.  She did get the prescription for the losartan and is taking it every other day in the evening.  She  remained on her HCTZ.  Her other medications include metoprolol 50 mg daily and potassium 10 mK daily.  In the office today her blood pressure by me was 122/54.  The patient tells me that her blood pressure at home was in the 120s today as well.  She has no other symptoms.  Past Medical History:  Diagnosis Date  . Arthritis    R knee, hands   . Blood transfusion    1986- post-partial hysterectomy  . Breast cancer [facility]) 2009   right lumpectomy and radiation  . Chronic kidney disease   . Colon cancer [facility]) 2003   per pt report, cancer found in a colon polyp in 2003, procedure done in [place].  did not require surgery, chemo etc.    . Family history of breast cancer 10/24/2017  . Gastritis   . GERD (gastroesophageal reflux disease)    recently taken off anti reflux tx  . Heart murmur    functional murmur, echo in [place]- long time ago   . Hepatitis    Hep. A- 1978  . Hiatal hernia   . Hypertension   . Lupus ([place])   . Personal history of radiation therapy 2009  . Renal disorder   . Syncope and collapse     Past Surgical History:  Procedure Laterality Date  . ABDOMINAL HYSTERECTOMY  1986  . APPENDECTOMY     1978 & tubal ligation   . BIOPSY  07/25/2018   Procedure: BIOPSY;  Surgeon: Milus Banister, MD;  Location: [facility] ENDOSCOPY;  Service: Endoscopy;;  . BREAST EXCISIONAL BIOPSY Left 1998  . BREAST SURGERY     R breast lumpectomy  . CHOLECYSTECTOMY     1978- open   . ESOPHAGOGASTRODUODENOSCOPY (EGD) WITH PROPOFOL N/A 07/25/2018   Procedure: ESOPHAGOGASTRODUODENOSCOPY (EGD) WITH PROPOFOL;  Surgeon: Milus Banister, MD;  Location: [facility] ENDOSCOPY;  Service: Endoscopy;  Laterality: N/A;  . REPLACEMENT TOTAL KNEE Left   . TONSILLECTOMY     1954  . TOTAL KNEE ARTHROPLASTY  08/30/2011   Procedure: TOTAL KNEE ARTHROPLASTY;  Surgeon: Kerin Salen;  Location: [place];  Service: Orthopedics;  Laterality: Right;  Right Total Knee Arthroplasty  . TUBAL LIGATION      Current  Medications: Current Meds  Medication Sig  . cholecalciferol (VITAMIN D) 1000 units tablet Take 1,000 Units by mouth daily.  Marland Kitchen ibuprofen (ADVIL) 800 MG tablet Take 800 mg by mouth every 8 (eight) hours as needed.  Marland Kitchen losartan (COZAAR) 25 MG tablet Take 25 mg by mouth every other day.  . meloxicam (MOBIC) 15 MG tablet 15 mg as needed.  . metoprolol succinate (TOPROL-XL) 25 MG 24 hr tablet Take 2 tablets (50 mg total) by mouth every morning.  . Multiple Vitamins-Minerals (EMERGEN-C IMMUNE PLUS PO) Take 1 tablet by mouth daily.  Marland Kitchen omeprazole (PRILOSEC) 40 MG capsule Take 1 capsule (40 mg total) by mouth daily.  . potassium chloride (KLOR-CON) 10 MEQ tablet Take 1 tablet (10 mEq total) by mouth daily.  . tamoxifen (NOLVADEX) 20 MG tablet Take 1 tablet (20 mg total) by mouth daily.     Allergies:   Iodine, Iohexol, Latex, Penicillins, Tetracycline, Other, Penicillin g, and Sulfa antibiotics   Social History   Socioeconomic History  . Marital status: Single    Spouse name: Not on file  . Number of children: Not on file  . Years of education: Not on file  . Highest education level: Not on file  Occupational History  . Not on file  Tobacco Use  . Smoking status: Never Smoker  . Smokeless tobacco: Never Used  Vaping Use  . Vaping Use: Never used  Substance and Sexual Activity  . Alcohol use: No  . Drug use: No  . Sexual activity: Yes  Other Topics Concern  . Not on file  Social History Narrative   ** Merged History Encounter **       Social Determinants of Health   Financial Resource Strain: Not on file  Food Insecurity: Not on file  Transportation Needs: Not on file  Physical Activity: Not on file  Stress: Not on file  Social Connections: Not on file     Family History: The patient's family history includes Breast cancer (age of onset: 45) in her maternal aunt; Breast cancer (age of onset: 33) in her mother; Cancer in an other family member; Cancer (age of onset: 58) in her  paternal aunt; Cancer (age of onset: 23) in her paternal grandmother; Heart attack in her father; Hypertension in her brother, mother, and another family member; Kidney disease in her brother; Lung cancer in her paternal grandmother; Other (age of onset: 66) in her maternal grandmother. There is no history of Anesthesia problems, Hypotension, Malignant hyperthermia, Pseudochol deficiency, Colon cancer, Esophageal cancer, Stomach cancer, Pancreatic cancer, or Liver disease.  ROS:   Please see  the history of present illness.     All other systems reviewed and are negative.  EKGs/Labs/Other Studies Reviewed:    The following studies were reviewed today: Echo 07/20/2018- Study Conclusions   - Left ventricle: The cavity size was normal. There was severe  focal basal and mild concentric hypertrophy. Systolic function  was normal. The estimated ejection fraction was in the range of  55% to 60%. Wall motion was normal; there were no regional wall  motion abnormalities. There was an increased relative  contribution of atrial contraction to ventricular filling.  Doppler parameters are consistent with abnormal left ventricular  relaxation (grade 1 diastolic dysfunction).  - Aortic valve: Trileaflet; normal thickness, mildly calcified  leaflets. There was mild regurgitation.  - Mitral valve: There was trivial regurgitation. Valve area by  pressure half-time: 2.06 cm^2.  - Tricuspid valve: There was trivial regurgitation.   EKG:  EKG is ordered today.  The ekg ordered today demonstrates NSR, HR 61  Recent Labs: 12/04/2019: Magnesium 1.7 04/29/2020: ALT 13; BUN 12; Creatinine, Ser 0.85; Hemoglobin 12.5; Platelets 200; Potassium 4.0; Sodium 141  Recent Lipid Panel    Component Value Date/Time   CHOL 143 11/24/2007 2222   TRIG 86 11/24/2007 2222   HDL 55 11/24/2007 2222   CHOLHDL 2.6 Ratio 11/24/2007 2222   VLDL 17 11/24/2007 2222   LDLCALC 71 11/24/2007 2222    Physical  Exam:    VS:  BP (!) 156/72   Pulse 61   Ht 5\' 7"  (1.702 m)   Wt 206 lb 9.6 oz (93.7 kg)   BMI 32.36 kg/m     Wt Readings from Last 3 Encounters:  09/04/20 206 lb 9.6 oz (93.7 kg)  04/29/20 201 lb 8 oz (91.4 kg)  12/03/19 195 lb (88.5 kg)     GEN:  Overweight AA female, well developed in no acute distress HEENT: Normal NECK: No JVD; No carotid bruits CARDIAC: RRR, no murmurs, rubs, gallops RESPIRATORY:  Clear to auscultation without rales, wheezing or rhonchi  ABDOMEN: Soft,  non-distended MUSCULOSKELETAL:  No edema; No deformity  SKIN: Warm and dry NEUROLOGIC:  Alert and oriented x 3 PSYCHIATRIC:  Normal affect   ASSESSMENT:    Essential hypertension Mild LVH on echo Oct 2019 B/P under control today  Family history of coronary artery disease in father Father had an MI in his 46's  History of breast cancer Biopsy and Tamoxifen 1998, recurrent in Rt breast 2009- treated with Tamoxifen and radiation. Followed by Dr Jana Hakim  Lupus [facility]) Followed by Dr Lenna Gilford  Obesity (BMI 30.0-34.9) She requested referral to weight loss clinic  PLAN:    Check BMP.  No change in current Rx- she will monitor her B/P at home.  Virtual f/u 2-3 weeks to review.  Weight loss clinic referral.    Medication Adjustments/Labs and Tests Ordered: Current medicines are reviewed at length with the patient today.  Concerns regarding medicines are outlined above.  Orders Placed This Encounter  Procedures  . Basic Metabolic Panel (BMET)  . Amb Ref to Medical Weight Management  . EKG 12-Lead   No orders of the defined types were placed in this encounter.   Patient Instructions  Medication Instructions:  Continue current medications  *If you need a refill on your cardiac medications before your next appointment, please call your pharmacy*   Lab Work: BMP    Testing/Procedures: None Ordered   Follow-Up: At [facility], you and your health needs are our priority.  As part  of  our continuing mission to provide you with exceptional heart care, we have created designated Provider Care Teams.  These Care Teams include your primary Cardiologist (physician) and Advanced Practice Providers (APPs -  Physician Assistants and Nurse Practitioners) who all work together to provide you with the care you need, when you need it.  We recommend signing up for the patient portal called "MyChart".  Sign up information is provided on this After Visit Summary.  MyChart is used to connect with patients for Virtual Visits (Telemedicine).  Patients are able to view lab/test results, encounter notes, upcoming appointments, etc.  Non-urgent messages can be sent to your provider as well.   To learn more about what you can do with MyChart, go to [email or web address].    Your next appointment:   Monday January 10th @ 9:15 am  The format for your next appointment:   Virtual Visit   Provider:   You will see one of the following Advanced Practice Providers on your designated Care Team:    Kerin Ransom, PA-C           Signed, Kerin Ransom, [place]  09/04/2020 10:20 AM    [place]

## 2020-09-04 NOTE — Assessment & Plan Note (Signed)
She requested referral to weight loss clinic

## 2020-09-04 NOTE — Assessment & Plan Note (Signed)
Followed by Dr Lenna Gilford

## 2020-09-04 NOTE — Assessment & Plan Note (Signed)
Mild LVH on echo Oct 2019 B/P under control today

## 2020-09-04 NOTE — Assessment & Plan Note (Signed)
Biopsy and Tamoxifen 1998, recurrent in Rt breast 2009- treated with Tamoxifen and radiation. Followed by Dr Jana Hakim

## 2020-09-29 ENCOUNTER — Telehealth (INDEPENDENT_AMBULATORY_CARE_PROVIDER_SITE_OTHER): Payer: Medicare HMO | Admitting: Cardiology

## 2020-09-29 DIAGNOSIS — I10 Essential (primary) hypertension: Secondary | ICD-10-CM

## 2020-09-29 MED ORDER — LOSARTAN POTASSIUM 25 MG PO TABS: 25.0000 mg | ORAL_TABLET | ORAL | 3 refills | Status: DC

## 2020-09-29 NOTE — Progress Notes (Signed)
Virtual Visit via Telephone Note   This visit type was conducted due to national recommendations for restrictions regarding the COVID-19 Pandemic (e.g. social distancing) in an effort to limit this patient's exposure and mitigate transmission in our community.  Due to her co-morbid illnesses, this patient is at least at moderate risk for complications without adequate follow up.  This format is felt to be most appropriate for this patient at this time.  The patient did not have access to video technology/had technical difficulties with video requiring transitioning to audio format only (telephone).  All issues noted in this document were discussed and addressed.  No physical exam could be performed with this format.  Please refer to the patient's chart for her  consent to telehealth for [facility].    Date:  09/29/2020   ID:  [name], DOB 1950-10-18, MRN [id number] The patient was identified using 2 identifiers.  Patient Location: Home Provider Location: Home Office  PCP:  Suella Broad, FNP  Cardiologist:  Skeet Latch, MD  Electrophysiologist:  None   Evaluation Performed:  Follow-Up Visit  Chief Complaint:  none  History of Present Illness:    [name] is a pleasant 70 y.o. female with a HTN and breast cancer.  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 Dr. Oval Linsey saw her in Oct 2019 for palpitations. The patient does have a family history of CAD, her father had an MI in his 81s. Dr. Oval Linsey ordered a Holter monitor and echo then. Echo done 07/20/2018 showed EF of 55 to 60% with mild concentric LVH. There was grade 1 diastolic dysfunction. She was treated with beta blocker.  The patient then presented to the emergency room via EMS on 07/23/2018 [place]. The patient had complained of abdominal pain. In the emergency room her troponins were negative.Her  EKG showed sinus rhythm without acute changes. Telemetry showed no arrhythmias. Her d-dimer was elevated-VQ scan was negative. CT scan of her abdomen without contrast was negative. She ultimately underwent GI evaluation which showed gastritis. She was placed on PPI.  She was seen in the office 09/04/2020.  She had taken her blood pressure randomly at home and got 190/120.  She was asymptomatic.  She went to [facility] and her medications were adjusted.  She was taken off her HCTZ and told to start losartan but she was reluctant to do this.  She did start taking the losartan every other day.  When I saw her in clinic 09/04/2020 her blood pressure was controlled.  I asked her to monitor her blood pressure and she was contacted today for follow-up.  She says her blood pressures been under good control, she says it usually runs 110/70.  Lab work was done on 09/04/2020 and showed a potassium of 4.2, BUN of 14, serum creatinine of 0.76.  The patient does not have symptoms concerning for COVID-19 infection (fever, chills, cough, or new shortness of breath).    Past Medical History:  Diagnosis Date  . Arthritis    R knee, hands   . Blood transfusion    1986- post-partial hysterectomy  . Breast cancer [facility]) 2009   right lumpectomy and radiation  . Chronic kidney disease   . Colon cancer [facility]) 2003   per pt report, cancer found in a colon polyp in 2003, procedure done in [place].  did not require surgery, chemo etc.    .  Family history of breast cancer 10/24/2017  . Gastritis   . GERD (gastroesophageal reflux disease)    recently taken off anti reflux tx  . Heart murmur    functional murmur, echo in [place]- long time ago   . Hepatitis    Hep. A- 1978  . Hiatal hernia   . Hypertension   . Lupus ([place])   . Personal history of radiation therapy 2009  . Renal disorder   . Syncope and collapse    Past Surgical History:  Procedure Laterality Date  . ABDOMINAL HYSTERECTOMY     1986  . APPENDECTOMY      1978 & tubal ligation   . BIOPSY  07/25/2018   Procedure: BIOPSY;  Surgeon: Milus Banister, MD;  Location[facility] ENDOSCOPY;  Service: Endoscopy;;  . BREAST EXCISIONAL BIOPSY Left 1998  . BREAST SURGERY     R breast lumpectomy  . CHOLECYSTECTOMY     1978- open   . ESOPHAGOGASTRODUODENOSCOPY (EGD) WITH PROPOFOL N/A 07/25/2018   Procedure: ESOPHAGOGASTRODUODENOSCOPY (EGD) WITH PROPOFOL;  Surgeon: Milus Banister, MD;  Location: [facility] ENDOSCOPY;  Service: Endoscopy;  Laterality: N/A;  . REPLACEMENT TOTAL KNEE Left   . TONSILLECTOMY     1954  . TOTAL KNEE ARTHROPLASTY  08/30/2011   Procedure: TOTAL KNEE ARTHROPLASTY;  Surgeon: Kerin Salen;  Location: [place];  Service: Orthopedics;  Laterality: Right;  Right Total Knee Arthroplasty  . TUBAL LIGATION       Current Meds  Medication Sig  . cholecalciferol (VITAMIN D) 1000 units tablet Take 1,000 Units by mouth daily.  Marland Kitchen ibuprofen (ADVIL) 800 MG tablet Take 800 mg by mouth every 8 (eight) hours as needed.  Marland Kitchen losartan (COZAAR) 25 MG tablet Take 25 mg by mouth every other day.  . meloxicam (MOBIC) 15 MG tablet 15 mg as needed.  . metoprolol succinate (TOPROL-XL) 25 MG 24 hr tablet Take 2 tablets (50 mg total) by mouth every morning.  . Multiple Vitamins-Minerals (EMERGEN-C IMMUNE PLUS PO) Take 1 tablet by mouth daily.  Marland Kitchen omeprazole (PRILOSEC) 40 MG capsule Take 1 capsule (40 mg total) by mouth daily.  . potassium chloride (KLOR-CON) 10 MEQ tablet Take 1 tablet (10 mEq total) by mouth daily.  . tamoxifen (NOLVADEX) 20 MG tablet Take 1 tablet (20 mg total) by mouth daily.     Allergies:   Iodine, Iohexol, Latex, Penicillins, Tetracycline, Other, Penicillin g, and Sulfa antibiotics   Social History   Tobacco Use  . Smoking status: Never Smoker  . Smokeless tobacco: Never Used  Vaping Use  . Vaping Use: Never used  Substance Use Topics  . Alcohol use: No  . Drug use: No     Family Hx: The patient's family history includes Breast cancer  (age of onset: 41) in her maternal aunt; Breast cancer (age of onset: 58) in her mother; Cancer in an other family member; Cancer (age of onset: 31) in her paternal aunt; Cancer (age of onset: 68) in her paternal grandmother; Heart attack in her father; Hypertension in her brother, mother, and another family member; Kidney disease in her brother; Lung cancer in her paternal grandmother; Other (age of onset: 80) in her maternal grandmother. There is no history of Anesthesia problems, Hypotension, Malignant hyperthermia, Pseudochol deficiency, Colon cancer, Esophageal cancer, Stomach cancer, Pancreatic cancer, or Liver disease.  ROS:   Please see the history of present illness.    All other systems reviewed and are negative.   Prior CV studies:  The following studies were reviewed today: Echo 07/20/2018- Study Conclusions   - Left ventricle: The cavity size was normal. There was severe  focal basal and mild concentric hypertrophy. Systolic function  was normal. The estimated ejection fraction was in the range of  55% to 60%. Wall motion was normal; there were no regional wall  motion abnormalities. There was an increased relative  contribution of atrial contraction to ventricular filling.  Doppler parameters are consistent with abnormal left ventricular  relaxation (grade 1 diastolic dysfunction).  - Aortic valve: Trileaflet; normal thickness, mildly calcified  leaflets. There was mild regurgitation.  - Mitral valve: There was trivial regurgitation. Valve area by  pressure half-time: 2.06 cm^2.  - Tricuspid valve: There was trivial regurgitation.    Labs/Other Tests and Data Reviewed:    EKG:  An ECG dated 09/04/2020 was personally reviewed today and demonstrated:  NSR, HR 61  Recent Labs: 12/04/2019: Magnesium 1.7 04/29/2020: ALT 13; Hemoglobin 12.5; Platelets 200 09/04/2020: BUN 14; Creatinine, Ser 0.76; Potassium 4.2; Sodium 140   Recent Lipid Panel Lab Results   Component Value Date/Time   CHOL 143 11/24/2007 10:22 PM   TRIG 86 11/24/2007 10:22 PM   HDL 55 11/24/2007 10:22 PM   CHOLHDL 2.6 Ratio 11/24/2007 10:22 PM   LDLCALC 71 11/24/2007 10:22 PM    Wt Readings from Last 3 Encounters:  09/04/20 206 lb 9.6 oz (93.7 kg)  04/29/20 201 lb 8 oz (91.4 kg)  12/03/19 195 lb (88.5 kg)     Risk Assessment/Calculations:      Objective:    Vital Signs:  There were no vitals taken for this visit.   VITAL SIGNS:  reviewed  ASSESSMENT & PLAN:    Essential hypertension Mild LVH on echo Oct 2019 B/P under control today  Family history of coronary artery disease in father Father had an MI in his 54's  History of breast cancer Biopsy and Tamoxifen 1998, recurrent in Rt breast 2009- treated with Tamoxifen and radiation. Followed by Dr Jana Hakim  Lupus [facility]) Followed by Dr Lenna Gilford  Obesity (BMI 30.0-34.9) She requested referral to weight loss clinic  Plan:  No change f/u Dr Oval Linsey in 6 months.   COVID-19 Education: The signs and symptoms of COVID-19 were discussed with the patient and how to seek care for testing (follow up with PCP or arrange E-visit).  The importance of social distancing was discussed today.  Time:   Today, I have spent 10 minutes with the patient with telehealth technology discussing the above problems.     Medication Adjustments/Labs and Tests Ordered: Current medicines are reviewed at length with the patient today.  Concerns regarding medicines are outlined above.   Tests Ordered: No orders of the defined types were placed in this encounter.   Medication Changes: No orders of the defined types were placed in this encounter.   Follow Up:  In Person with Dr Oval Linsey in the office  Signed, Kerin Ransom, PA-C  09/29/2020 9:20 AM    [place]

## 2020-09-29 NOTE — Patient Instructions (Signed)
Medication Instructions:  Your Physician recommend you continue on your current medication as directed.    *If you need a refill on your cardiac medications before your next appointment, please call your pharmacy*   Lab Work: None  Testing/Procedures: None   Follow-Up: At [facility], you and your health needs are our priority.  As part of our continuing mission to provide you with exceptional heart care, we have created designated Provider Care Teams.  These Care Teams include your primary Cardiologist (physician) and Advanced Practice Providers (APPs -  Physician Assistants and Nurse Practitioners) who all work together to provide you with the care you need, when you need it.  We recommend signing up for the patient portal called "MyChart".  Sign up information is provided on this After Visit Summary.  MyChart is used to connect with patients for Virtual Visits (Telemedicine).  Patients are able to view lab/test results, encounter notes, upcoming appointments, etc.  Non-urgent messages can be sent to your provider as well.   To learn more about what you can do with MyChart, go to [email or web address].    Your next appointment:   6 month(s)  The format for your next appointment:   In Person  Provider:   Tiffany Suffolk, MD    

## 2020-10-29 NOTE — Progress Notes (Signed)
[place]  Telephone:(336) [phone] Fax:(336) [phone]     ID: [name] DOB: 05-08-1951  MR#: [id number]  OEU#:[id number]  Patient Care Team: Suella Broad, FNP as PCP - General (Nurse Practitioner) Skeet Latch, MD as PCP - Cardiology (Cardiology) Gavin Pound, MD as Consulting Physician (Rheumatology) Willia Craze, NP as Nurse Practitioner (Gastroenterology) Aditi Rovira, Virgie Dad, MD as Consulting Physician (Oncology) Leanora Cover, MD as Consulting Physician (Orthopedic Surgery) OTHER MD:   CHIEF COMPLAINT: Estrogen receptor positive breast cancer  CURRENT TREATMENT: tamoxifen    INTERVAL HISTORY: [name] returns today for follow-up of her estrogen receptor positive breast cancer.   She continues on tamoxifen, with good tolerance. She reports hot flashes and vaginal wetness, but neither of these are bothersome.  Since her last visit, she underwent bilateral diagnostic mammography with tomography and left axilla ultrasound at [facility] on 05/12/2020 showing: breast density category B; no evidence of malignancy in either breast.    REVIEW OF SYSTEMS: [name] is having some lower abdominal discomfort.  Of course she has significant scar tissue from her prior cholecystectomy.  She has a little bit of swelling in her hands and tells me she will be seeing Dr. Lenna Gilford in rheumatology soon.  She has lost her primary care physician and would like a referral to a different one.  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  Aside from all that she has mild urinary frequency.  No hematuria or fever.   COVID 19 VACCINATION STATUS: [place] x2, most recently 11/2019   BREAST CANCER HISTORY: From Dr. Dana Allan earlier summary:  "#1 patient is status post lumpectomy with sentinel lymph node biopsy followed by radiation to the right breast. In  December 2009 patient began aromatase inhibitor but could not tolerate it.  #2 therefore she began in March 2010 tamoxifen 20 mg daily. However she has experienced significant amount of aches and pains and in December 2012 discontinued it. At this time she does not want to go back on any kind of antiestrogen therapy due to side effects.  #3 patient would like to go back on tamoxifen 20 mg daily. She recently attended finding your new normal seminar. And because of that she wants to she would like to go back on the tamoxifen. We discussed side effects again. She was sent to her pharmacy."  Her subsequent history is as detailed below   PAST MEDICAL HISTORY: Past Medical History:  Diagnosis Date  . Arthritis    R knee, hands   . Blood transfusion    1986- post-partial hysterectomy  . Breast cancer [facility]) 2009   right lumpectomy and radiation  . Chronic kidney disease   . Colon cancer [facility]) 2003   per pt report, cancer found in a colon polyp in 2003, procedure done in [place].  did not require surgery, chemo etc.    . Family history of breast cancer 10/24/2017  . Gastritis   . GERD (gastroesophageal reflux disease)    recently taken off anti reflux tx  . Heart murmur    functional murmur, echo in [place]- long time ago   . Hepatitis    Hep. A- 1978  . Hiatal hernia   . Hypertension   . Lupus ([place])   . Personal history of radiation therapy 2009  . Renal disorder   . Syncope and collapse     PAST SURGICAL HISTORY:  Past Surgical History:  Procedure Laterality Date  . ABDOMINAL HYSTERECTOMY     1986  . APPENDECTOMY     1978 & tubal ligation   . BIOPSY  07/25/2018   Procedure: BIOPSY;  Surgeon: Milus Banister, MD;  Location: [facility] ENDOSCOPY;  Service: Endoscopy;;  . BREAST EXCISIONAL BIOPSY Left 1998  . BREAST SURGERY     R breast lumpectomy  . CHOLECYSTECTOMY     1978- open   . ESOPHAGOGASTRODUODENOSCOPY (EGD) WITH PROPOFOL N/A 07/25/2018   Procedure: ESOPHAGOGASTRODUODENOSCOPY (EGD) WITH  PROPOFOL;  Surgeon: Milus Banister, MD;  Location: [facility] ENDOSCOPY;  Service: Endoscopy;  Laterality: N/A;  . REPLACEMENT TOTAL KNEE Left   . TONSILLECTOMY     1954  . TOTAL KNEE ARTHROPLASTY  08/30/2011   Procedure: TOTAL KNEE ARTHROPLASTY;  Surgeon: Kerin Salen;  Location: [place];  Service: Orthopedics;  Laterality: Right;  Right Total Knee Arthroplasty  . TUBAL LIGATION      FAMILY HISTORY Family History  Problem Relation Age of Onset  . Heart attack Father   . Cancer Other   . Hypertension Other   . Breast cancer Mother 24  . Hypertension Mother   . Hypertension Brother   . Kidney disease Brother   . Breast cancer Maternal Aunt 11  . Cancer Paternal Aunt 66       type unk  . Other Maternal Grandmother 102       brain tumor dx at 2- did not bx or do additional testing on tumor  . Cancer Paternal Grandmother 82       type unk  . Lung cancer Paternal Grandmother   . Anesthesia problems Neg Hx   . Hypotension Neg Hx   . Malignant hyperthermia Neg Hx   . Pseudochol deficiency Neg Hx   . Colon cancer Neg Hx   . Esophageal cancer Neg Hx   . Stomach cancer Neg Hx   . Pancreatic cancer Neg Hx   . Liver disease Neg Hx    the patient's father died from a heart attack at the age of 44. The patient's mother died at the age of 84. She had a history of breast cancer and one of her sisters was diagnosed with breast cancer at the age of 33. The patient had 5 brothers and 5 sisters. There is no other history of breast or ovarian cancer in the family to her knowledge.   GYNECOLOGIC HISTORY:  No LMP recorded. Patient has had a hysterectomy. Menarche age 77, first live birth age 71. The patient is GX P3. She had a simple hysterectomy in 1986 (no salpingo-oophorectomy).   SOCIAL HISTORY: (Updated February 2022). 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  She  remarried in 2011. Her husband Laveda Abbe is retired from [employer]. The patient has one adopted son in addition to her own children. 3 of her children live in town 1 in [place]. She has 7 grandchildren. She attends [employer] locally.    ADVANCED DIRECTIVES: Not in place   HEALTH MAINTENANCE: Social History   Tobacco Use  . Smoking status: Never Smoker  . Smokeless tobacco: Never Used  Vaping Use  . Vaping Use: Never used  Substance Use Topics  . Alcohol use: No  . Drug use: No     Colonoscopy:  PAP:  Bone density: 04/30/2008 was normal  Lipid panel:  Allergies  Allergen Reactions  . Iodine Anaphylaxis  . Iohexol Anaphylaxis     Code: HIVES, Desc: PT IS ALLERGIC TO IODINATED CONTRAST; REACTION INCLUDES FACIAL SWELLING,HIVES,RESPIRATORY DISTRESS;PT HASN'T HAD SCAN W/PREMEDS.;REFUSED CONTRAST OF ANY KIND!  KR   . Latex Anaphylaxis  . Penicillins Anaphylaxis and Rash    Has patient had a PCN reaction causing immediate rash, facial/tongue/throat swelling, SOB or lightheadedness with hypotension: Yes Has patient had a PCN reaction causing severe rash involving mucus membranes or skin necrosis: Yes Has patient had a PCN reaction that required hospitalization: No Has patient had a PCN reaction occurring within the last 10 years: No If all of the above answers are "NO", then may proceed with Cephalosporin use.   . Tetracycline Anaphylaxis  . Other Other (See Comments)  . Penicillin G Other (See Comments)  . Sulfa Antibiotics Hives    Current Outpatient Medications  Medication Sig Dispense Refill  . cholecalciferol (VITAMIN D) 1000 units tablet Take 1,000 Units by mouth daily.    Marland Kitchen ibuprofen (ADVIL) 800 MG tablet Take 800 mg by mouth every 8 (eight) hours as needed.    Marland Kitchen losartan (COZAAR) 25 MG tablet Take 1 tablet (25 mg total) by mouth every other day. 45 tablet 3  . meloxicam (MOBIC) 15 MG tablet 15 mg as needed.    . metoprolol succinate  (TOPROL-XL) 25 MG 24 hr tablet Take 2 tablets (50 mg total) by mouth every morning. 60 tablet 1  . Multiple Vitamins-Minerals (EMERGEN-C IMMUNE PLUS PO) Take 1 tablet by mouth daily.    Marland Kitchen omeprazole (PRILOSEC) 40 MG capsule Take 1 capsule (40 mg total) by mouth daily. 30 capsule 5  . potassium chloride (KLOR-CON) 10 MEQ tablet Take 1 tablet (10 mEq total) by mouth daily. 90 tablet 4  . tamoxifen (NOLVADEX) 20 MG tablet Take 1 tablet (20 mg total) by mouth daily. 90 tablet 4   No current facility-administered medications for this visit.    OBJECTIVE: African-American woman who appears well Vitals:   10/30/20 0940  BP: (!) 144/75  Pulse: 64  Resp: 18  Temp: 97.7 F (36.5 C)  SpO2: 98%     Body mass index is 32.39 kg/m.    ECOG FS:1 - Symptomatic but completely ambulatory   Sclerae unicteric, EOMs intact Wearing a mask No cervical or supraclavicular adenopathy Lungs no rales or rhonchi Heart regular rate and rhythm Abd soft, nontender, positive bowel sounds, no palpable masses MSK no focal spinal tenderness, no upper extremity lymphedema Neuro: nonfocal, well oriented, appropriate affect Breasts: The right breast is benign.  The left breast is status post lumpectomy and radiation.  There is no evidence of local recurrence.  Both axillae are benign.   LAB RESULTS:  CMP     Component Value Date/Time   NA 140 09/04/2020 1037   NA 141 01/26/2017 1508   K 4.2 09/04/2020 1037   K 3.4 (L) 01/26/2017 1508   CL 102 09/04/2020 1037   CL 102 01/25/2013 0909   CO2 24 09/04/2020 1037   CO2 29 01/26/2017 1508   GLUCOSE 80 09/04/2020 1037   GLUCOSE 85 04/29/2020 1141   GLUCOSE 91 01/26/2017 1508   GLUCOSE 81 01/25/2013 0909   BUN 14 09/04/2020 1037   BUN 11.5 01/26/2017 1508   CREATININE 0.76 09/04/2020 1037   CREATININE 1.04 (H) 02/15/2019 1436   CREATININE 0.8 01/26/2017 1508   CALCIUM 9.6 09/04/2020 1037   CALCIUM  9.5 01/26/2017 1508   PROT 7.4 04/29/2020 1141   PROT 7.1  01/26/2017 1508   ALBUMIN 3.6 04/29/2020 1141   ALBUMIN 3.5 01/26/2017 1508   AST 21 04/29/2020 1141   AST 24 02/15/2019 1436   AST 22 01/26/2017 1508   ALT 13 04/29/2020 1141   ALT 17 02/15/2019 1436   ALT 17 01/26/2017 1508   ALKPHOS 90 04/29/2020 1141   ALKPHOS 72 01/26/2017 1508   BILITOT 0.4 04/29/2020 1141   BILITOT 0.3 02/15/2019 1436   BILITOT 0.38 01/26/2017 1508   GFRNONAA 79 09/04/2020 1037   GFRNONAA 54 (L) 02/15/2019 1436   GFRAA 91 09/04/2020 1037   GFRAA >60 02/15/2019 1436    INo results found for: SPEP, UPEP  Lab Results  Component Value Date   WBC 3.9 (L) 10/30/2020   NEUTROABS 1.2 (L) 10/30/2020   HGB 11.4 (L) 10/30/2020   HCT 34.9 (L) 10/30/2020   MCV 93.6 10/30/2020   PLT 169 10/30/2020      Chemistry      Component Value Date/Time   NA 140 09/04/2020 1037   NA 141 01/26/2017 1508   K 4.2 09/04/2020 1037   K 3.4 (L) 01/26/2017 1508   CL 102 09/04/2020 1037   CL 102 01/25/2013 0909   CO2 24 09/04/2020 1037   CO2 29 01/26/2017 1508   BUN 14 09/04/2020 1037   BUN 11.5 01/26/2017 1508   CREATININE 0.76 09/04/2020 1037   CREATININE 1.04 (H) 02/15/2019 1436   CREATININE 0.8 01/26/2017 1508      Component Value Date/Time   CALCIUM 9.6 09/04/2020 1037   CALCIUM 9.5 01/26/2017 1508   ALKPHOS 90 04/29/2020 1141   ALKPHOS 72 01/26/2017 1508   AST 21 04/29/2020 1141   AST 24 02/15/2019 1436   AST 22 01/26/2017 1508   ALT 13 04/29/2020 1141   ALT 17 02/15/2019 1436   ALT 17 01/26/2017 1508   BILITOT 0.4 04/29/2020 1141   BILITOT 0.3 02/15/2019 1436   BILITOT 0.38 01/26/2017 1508       Lab Results  Component Value Date   LABCA2 16 11/09/2010    No components found for: [id number]  No results for input(s): INR in the last 168 hours.  Urinalysis    Component Value Date/Time   COLORURINE YELLOW 04/29/2020 1150   APPEARANCEUR CLEAR 04/29/2020 1150   LABSPEC 1.021 04/29/2020 1150   LABSPEC 1.015 01/26/2017 1523   PHURINE 6.0  04/29/2020 1150   GLUCOSEU NEGATIVE 04/29/2020 1150   GLUCOSEU Negative 01/26/2017 1523   HGBUR SMALL (A) 04/29/2020 1150   HGBUR negative 01/22/2010 1551   BILIRUBINUR NEGATIVE 04/29/2020 1150   BILIRUBINUR Negative 01/26/2017 1523   KETONESUR NEGATIVE 04/29/2020 1150   PROTEINUR NEGATIVE 04/29/2020 1150   UROBILINOGEN 0.2 01/26/2017 1523   NITRITE NEGATIVE 04/29/2020 1150   LEUKOCYTESUR NEGATIVE 04/29/2020 1150   LEUKOCYTESUR Small 01/26/2017 1523    STUDIES: No results found.   ASSESSMENT: 70 y.o. 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  (2) status post right lumpectomy 03/20/2008 for invasive lobular carcinoma, multifocal, pT1a pNX, stage IA, grade 2, strongly estrogen and progesterone receptor positive, HER-2 negative. with negative margins.  (3) status post right axillary sentinel lymph node sampling 04/17/2008, the single sentinel lymph node being clear  (4) Oncotype score of 10 predicts an outside the breast risk of recurrence of  7% if the patient's only systemic therapy is tamoxifen for 5 years.  (5) Additional right breast biopsy 05/23/2008 to evaluate microcalcifications seen on pre-radiotherapy mammography showed only atypical lobular hyperplasia  (6) Completed radiation to the right breast 07/26/2008 (50 gray +14 gray boost).  (7) Started on anastrozole November 2009, with poor tolerance  (8). Started on tamoxifen March 2010, continued to December 2012, resumed December 2014   (a) tamoxifen discontinued February 2022  (9) status post simple hysterectomy 1986 (no salpingo-oophorectomy)  (10) genetics testing 10/31/2017 through the Common Hereditary Cancers Panel offered by [facility] found no deleterious mutations in APC, ATM, AXIN2, BARD1, BMPR1A, BRCA1, BRCA2, BRIP1, CDH1, CDKN2A (p14ARF), CDKN2A  (p16INK4a), CKD4, CHEK2, CTNNA1, DICER1, EPCAM (Deletion/duplication testing only), GREM1 (promoter region deletion/duplication testing only), KIT, MEN1, MLH1, MSH2, MSH3, MSH6, MUTYH, NBN, NF1, NHTL1, PALB2, PDGFRA, PMS2, POLD1, POLE, PTEN, RAD50, RAD51C, RAD51D, SDHB, SDHC, SDHD, SMAD4, SMARCA4. STK11, TP53, TSC1, TSC2, and VHL.  The following genes were evaluated for sequence changes only: SDHA and HOXB13 c.251G>A variant only.  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    PLAN: [name] is now 12-1/2 years out from definitive surgery for her breast cancer with no evidence of disease recurrence.  This is very favorable.  She is reluctant to discontinue tamoxifen.  We discussed the fact that we simply have no data for continuing tamoxifen beyond 10 years.  It may be helpful or it may be harmful.  Tamoxifen can cause blood clots after all.  On the other hand she does not have to worry about endometrial cancer since she is status post hysterectomy.  After much discussion she is agreeable to stopping tamoxifen.  I think this is the right decision for her.  She remains very concerned regarding cancer because of her family history.  I will see her again in 66month.  In the meantime she has lost her primary care physician.  I will see if Dr. BQuay Burowlet our can see her and if not perhaps someone else in that office.  Also [name] is having some urinary tract symptoms.  We are obtaining urine tests today.  Finally I do not know why she has a slight change in her labs.  This may just be artifactual from the way the labs were drawn.  I am going to repeat lab work in a month just to make sure.  Total encounter time 5 minutes.*Sarajane JewsC. Seville Downs, MD 10/30/20 10:01 AM Medical Oncology [facility] [id number]. [phone]   Fax. [phone]  I, KWilburn Mylar am acting as scribe for  Dr. GVirgie Dad [name].  I, GLurline DelMD, have reviewed the above documentation for accuracy and completeness, and I agree with the above.   *Total Encounter Time as defined by the Centers for Medicare and Medicaid Services includes, in addition to the face-to-face time of a patient visit (documented in the note above) non-face-to-face time: obtaining and reviewing outside history, ordering and reviewing medications, tests or procedures, care coordination (communications with other health care professionals or caregivers) and documentation in the medical record.

## 2020-10-30 ENCOUNTER — Inpatient Hospital Stay: Payer: Medicare HMO | Attending: Oncology | Admitting: Oncology

## 2020-10-30 ENCOUNTER — Inpatient Hospital Stay: Payer: Medicare HMO

## 2020-10-30 ENCOUNTER — Other Ambulatory Visit: Payer: Self-pay

## 2020-10-30 VITALS — BP 144/75 | HR 64 | Temp 97.7°F | Resp 18 | Ht 67.0 in | Wt 206.8 lb

## 2020-10-30 DIAGNOSIS — C50411 Malignant neoplasm of upper-outer quadrant of right female breast: Secondary | ICD-10-CM

## 2020-10-30 DIAGNOSIS — E66811 Obesity, class 1: Secondary | ICD-10-CM

## 2020-10-30 DIAGNOSIS — Z803 Family history of malignant neoplasm of breast: Secondary | ICD-10-CM | POA: Diagnosis not present

## 2020-10-30 DIAGNOSIS — M7989 Other specified soft tissue disorders: Secondary | ICD-10-CM | POA: Insufficient documentation

## 2020-10-30 DIAGNOSIS — Z853 Personal history of malignant neoplasm of breast: Secondary | ICD-10-CM | POA: Insufficient documentation

## 2020-10-30 DIAGNOSIS — K2970 Gastritis, unspecified, without bleeding: Secondary | ICD-10-CM | POA: Diagnosis not present

## 2020-10-30 DIAGNOSIS — E669 Obesity, unspecified: Secondary | ICD-10-CM

## 2020-10-30 DIAGNOSIS — I10 Essential (primary) hypertension: Secondary | ICD-10-CM | POA: Diagnosis not present

## 2020-10-30 DIAGNOSIS — Z809 Family history of malignant neoplasm, unspecified: Secondary | ICD-10-CM | POA: Diagnosis not present

## 2020-10-30 DIAGNOSIS — Z170 Estrogen receptor positive status [ER+]: Secondary | ICD-10-CM

## 2020-10-30 DIAGNOSIS — K2990 Gastroduodenitis, unspecified, without bleeding: Secondary | ICD-10-CM

## 2020-10-30 DIAGNOSIS — Z85038 Personal history of other malignant neoplasm of large intestine: Secondary | ICD-10-CM

## 2020-10-30 DIAGNOSIS — Z8249 Family history of ischemic heart disease and other diseases of the circulatory system: Secondary | ICD-10-CM | POA: Diagnosis not present

## 2020-10-30 DIAGNOSIS — Z923 Personal history of irradiation: Secondary | ICD-10-CM | POA: Insufficient documentation

## 2020-10-30 DIAGNOSIS — Z79899 Other long term (current) drug therapy: Secondary | ICD-10-CM | POA: Diagnosis not present

## 2020-10-30 DIAGNOSIS — Z841 Family history of disorders of kidney and ureter: Secondary | ICD-10-CM | POA: Diagnosis not present

## 2020-10-30 LAB — URINALYSIS, COMPLETE (UACMP) WITH MICROSCOPIC
Bilirubin Urine: NEGATIVE
Glucose, UA: NEGATIVE mg/dL
Hgb urine dipstick: NEGATIVE
Ketones, ur: NEGATIVE mg/dL
Leukocytes,Ua: NEGATIVE
Nitrite: NEGATIVE
Protein, ur: NEGATIVE mg/dL
Specific Gravity, Urine: 1.018 (ref 1.005–1.030)
pH: 6 (ref 5.0–8.0)

## 2020-10-30 LAB — CBC WITH DIFFERENTIAL/PLATELET
Abs Immature Granulocytes: 0.01 10*3/uL (ref 0.00–0.07)
Basophils Absolute: 0 10*3/uL (ref 0.0–0.1)
Basophils Relative: 1 %
Eosinophils Absolute: 0.2 10*3/uL (ref 0.0–0.5)
Eosinophils Relative: 5 %
HCT: 34.9 % — ABNORMAL LOW (ref 36.0–46.0)
Hemoglobin: 11.4 g/dL — ABNORMAL LOW (ref 12.0–15.0)
Immature Granulocytes: 0 %
Lymphocytes Relative: 49 %
Lymphs Abs: 1.9 10*3/uL (ref 0.7–4.0)
MCH: 30.6 pg (ref 26.0–34.0)
MCHC: 32.7 g/dL (ref 30.0–36.0)
MCV: 93.6 fL (ref 80.0–100.0)
Monocytes Absolute: 0.5 10*3/uL (ref 0.1–1.0)
Monocytes Relative: 14 %
Neutro Abs: 1.2 10*3/uL — ABNORMAL LOW (ref 1.7–7.7)
Neutrophils Relative %: 31 %
Platelets: 169 10*3/uL (ref 150–400)
RBC: 3.73 MIL/uL — ABNORMAL LOW (ref 3.87–5.11)
RDW: 12.8 % (ref 11.5–15.5)
WBC: 3.9 10*3/uL — ABNORMAL LOW (ref 4.0–10.5)
nRBC: 0 % (ref 0.0–0.2)

## 2020-10-30 LAB — COMPREHENSIVE METABOLIC PANEL
ALT: 16 U/L (ref 0–44)
AST: 26 U/L (ref 15–41)
Albumin: 3.4 g/dL — ABNORMAL LOW (ref 3.5–5.0)
Alkaline Phosphatase: 81 U/L (ref 38–126)
Anion gap: 7 (ref 5–15)
BUN: 14 mg/dL (ref 8–23)
CO2: 26 mmol/L (ref 22–32)
Calcium: 9 mg/dL (ref 8.9–10.3)
Chloride: 107 mmol/L (ref 98–111)
Creatinine, Ser: 0.8 mg/dL (ref 0.44–1.00)
GFR, Estimated: >60 mL/min (ref >60)
Glucose, Bld: 82 mg/dL (ref 70–99)
Potassium: 3.4 mmol/L — ABNORMAL LOW (ref 3.5–5.1)
Sodium: 140 mmol/L (ref 135–145)
Total Bilirubin: 0.5 mg/dL (ref 0.3–1.2)
Total Protein: 6.6 g/dL (ref 6.5–8.1)

## 2020-10-31 ENCOUNTER — Telehealth: Payer: Self-pay | Admitting: Oncology

## 2020-10-31 ENCOUNTER — Other Ambulatory Visit: Payer: Self-pay

## 2020-10-31 ENCOUNTER — Inpatient Hospital Stay: Payer: Medicare HMO

## 2020-10-31 ENCOUNTER — Telehealth: Payer: Self-pay | Admitting: *Deleted

## 2020-10-31 DIAGNOSIS — N390 Urinary tract infection, site not specified: Secondary | ICD-10-CM

## 2020-10-31 DIAGNOSIS — Z853 Personal history of malignant neoplasm of breast: Secondary | ICD-10-CM | POA: Diagnosis not present

## 2020-10-31 LAB — URINALYSIS, COMPLETE (UACMP) WITH MICROSCOPIC
Bilirubin Urine: NEGATIVE
Glucose, UA: NEGATIVE mg/dL
Ketones, ur: 5 mg/dL — AB
Leukocytes,Ua: NEGATIVE
Nitrite: NEGATIVE
Protein, ur: NEGATIVE mg/dL
Specific Gravity, Urine: 1.027 (ref 1.005–1.030)
pH: 6 (ref 5.0–8.0)

## 2020-10-31 LAB — URINE CULTURE

## 2020-10-31 NOTE — Telephone Encounter (Signed)
-----   Message from Gardenia Phlegm, NP sent at 10/31/2020  1:34 PM EST ----- Hey there.  Please call patient about her urinating and find out if still symptomatic.  If so, she needs to come back in and leave another urinalysis and urine culture.   ----- Message ----- From: Buel Ream, Lab In [place] Sent: 10/30/2020  12:15 PM EST To: Chauncey Cruel, MD

## 2020-10-31 NOTE — Telephone Encounter (Signed)
Scheduled appts per 2/10 los. Was not able to leave a voicemail. Mailed appt reminder and calendar.

## 2020-10-31 NOTE — Telephone Encounter (Signed)
Pt states she is still having some symptoms. Will come by today to leave sample

## 2020-11-02 LAB — URINE CULTURE

## 2020-12-05 ENCOUNTER — Inpatient Hospital Stay: Payer: Medicare HMO | Attending: Oncology

## 2020-12-30 ENCOUNTER — Encounter: Payer: Self-pay | Admitting: Internal Medicine

## 2020-12-30 ENCOUNTER — Other Ambulatory Visit: Payer: Self-pay

## 2020-12-30 ENCOUNTER — Ambulatory Visit (INDEPENDENT_AMBULATORY_CARE_PROVIDER_SITE_OTHER): Payer: Medicare HMO | Admitting: Internal Medicine

## 2020-12-30 VITALS — BP 148/88 | HR 66 | Temp 98.4°F | Resp 16 | Ht 67.5 in | Wt 208.0 lb

## 2020-12-30 DIAGNOSIS — E785 Hyperlipidemia, unspecified: Secondary | ICD-10-CM

## 2020-12-30 DIAGNOSIS — R739 Hyperglycemia, unspecified: Secondary | ICD-10-CM

## 2020-12-30 DIAGNOSIS — R002 Palpitations: Secondary | ICD-10-CM | POA: Diagnosis not present

## 2020-12-30 DIAGNOSIS — Z0000 Encounter for general adult medical examination without abnormal findings: Secondary | ICD-10-CM

## 2020-12-30 DIAGNOSIS — I119 Hypertensive heart disease without heart failure: Secondary | ICD-10-CM | POA: Insufficient documentation

## 2020-12-30 DIAGNOSIS — Z0001 Encounter for general adult medical examination with abnormal findings: Secondary | ICD-10-CM | POA: Insufficient documentation

## 2020-12-30 DIAGNOSIS — E876 Hypokalemia: Secondary | ICD-10-CM

## 2020-12-30 DIAGNOSIS — I10 Essential (primary) hypertension: Secondary | ICD-10-CM | POA: Diagnosis not present

## 2020-12-30 DIAGNOSIS — Z23 Encounter for immunization: Secondary | ICD-10-CM

## 2020-12-30 DIAGNOSIS — D539 Nutritional anemia, unspecified: Secondary | ICD-10-CM | POA: Diagnosis not present

## 2020-12-30 DIAGNOSIS — E5111 Dry beriberi: Secondary | ICD-10-CM

## 2020-12-30 DIAGNOSIS — Z1211 Encounter for screening for malignant neoplasm of colon: Secondary | ICD-10-CM

## 2020-12-30 LAB — CBC WITH DIFFERENTIAL/PLATELET
Basophils Absolute: 0.1 10*3/uL (ref 0.0–0.1)
Basophils Relative: 1.5 % (ref 0.0–3.0)
Eosinophils Absolute: 0.1 10*3/uL (ref 0.0–0.7)
Eosinophils Relative: 2.8 % (ref 0.0–5.0)
HCT: 38.2 % (ref 36.0–46.0)
Hemoglobin: 12.7 g/dL (ref 12.0–15.0)
Lymphocytes Relative: 49.4 % — ABNORMAL HIGH (ref 12.0–46.0)
Lymphs Abs: 2.5 10*3/uL (ref 0.7–4.0)
MCHC: 33.2 g/dL (ref 30.0–36.0)
MCV: 92.6 fl (ref 78.0–100.0)
Monocytes Absolute: 0.6 10*3/uL (ref 0.1–1.0)
Monocytes Relative: 12.3 % — ABNORMAL HIGH (ref 3.0–12.0)
Neutro Abs: 1.7 10*3/uL (ref 1.4–7.7)
Neutrophils Relative %: 34 % — ABNORMAL LOW (ref 43.0–77.0)
Platelets: 178 10*3/uL (ref 150.0–400.0)
RBC: 4.13 Mil/uL (ref 3.87–5.11)
RDW: 13.7 % (ref 11.5–15.5)
WBC: 5.1 10*3/uL (ref 4.0–10.5)

## 2020-12-30 LAB — BASIC METABOLIC PANEL
BUN: 13 mg/dL (ref 6–23)
CO2: 28 mEq/L (ref 19–32)
Calcium: 9.9 mg/dL (ref 8.4–10.5)
Chloride: 103 mEq/L (ref 96–112)
Creatinine, Ser: 0.75 mg/dL (ref 0.40–1.20)
GFR: 79.84 mL/min (ref >60.00)
Glucose, Bld: 78 mg/dL (ref 70–99)
Potassium: 3.9 mEq/L (ref 3.5–5.1)
Sodium: 141 mEq/L (ref 135–145)

## 2020-12-30 LAB — LIPID PANEL
Cholesterol: 159 mg/dL (ref 0–200)
HDL: 61.3 mg/dL (ref >39.00)
LDL Cholesterol: 76 mg/dL (ref 0–99)
NonHDL: 98.08
Total CHOL/HDL Ratio: 3
Triglycerides: 109 mg/dL (ref 0.0–149.0)
VLDL: 21.8 mg/dL (ref 0.0–40.0)

## 2020-12-30 LAB — HEMOGLOBIN A1C: Hgb A1c MFr Bld: 5.7 % (ref 4.6–6.5)

## 2020-12-30 LAB — IRON: Iron: 179 ug/dL — ABNORMAL HIGH (ref 42–145)

## 2020-12-30 LAB — VITAMIN B12: Vitamin B-12: 657 pg/mL (ref 211–911)

## 2020-12-30 LAB — TSH: TSH: 1.14 u[IU]/mL (ref 0.35–4.50)

## 2020-12-30 LAB — FERRITIN: Ferritin: 18.1 ng/mL (ref 10.0–291.0)

## 2020-12-30 LAB — MAGNESIUM: Magnesium: 1.7 mg/dL (ref 1.5–2.5)

## 2020-12-30 LAB — FOLATE: Folate: >23.6 ng/mL (ref >5.9)

## 2020-12-30 MED ORDER — SHINGRIX 50 MCG/0.5ML IM SUSR: 0.5000 mL | Freq: Once | INTRAMUSCULAR | 1 refills | Status: AC

## 2020-12-30 MED ORDER — METOPROLOL SUCCINATE ER 25 MG PO TB24: 25.0000 mg | ORAL_TABLET | Freq: Every day | ORAL | 1 refills | Status: DC

## 2020-12-30 NOTE — Patient Instructions (Signed)
Health Maintenance, Female Adopting a healthy lifestyle and getting preventive care are important in promoting health and wellness. Ask your health care provider about:  The right schedule for you to have regular tests and exams.  Things you can do on your own to prevent diseases and keep yourself healthy. What should I know about diet, weight, and exercise? Eat a healthy diet  Eat a diet that includes plenty of vegetables, fruits, low-fat dairy products, and lean protein.  Do not eat a lot of foods that are high in solid fats, added sugars, or sodium.   Maintain a healthy weight Body mass index (BMI) is used to identify weight problems. It estimates body fat based on height and weight. Your health care provider can help determine your BMI and help you achieve or maintain a healthy weight. Get regular exercise Get regular exercise. This is one of the most important things you can do for your health. Most adults should:  Exercise for at least 150 minutes each week. The exercise should increase your heart rate and make you sweat (moderate-intensity exercise).  Do strengthening exercises at least twice a week. This is in addition to the moderate-intensity exercise.  Spend less time sitting. Even light physical activity can be beneficial. Watch cholesterol and blood lipids Have your blood tested for lipids and cholesterol at 72 years of age, then have this test every 5 years. Have your cholesterol levels checked more often if:  Your lipid or cholesterol levels are high.  You are older than 72 years of age.  You are at high risk for heart disease. What should I know about cancer screening? Depending on your health history and family history, you may need to have cancer screening at various ages. This may include screening for:  Breast cancer.  Cervical cancer.  Colorectal cancer.  Skin cancer.  Lung cancer. What should I know about heart disease, diabetes, and high blood  pressure? Blood pressure and heart disease  High blood pressure causes heart disease and increases the risk of stroke. This is more likely to develop in people who have high blood pressure readings, are of African descent, or are overweight.  Have your blood pressure checked: ? Every 3-5 years if you are 18-39 years of age. ? Every year if you are 40 years old or older. Diabetes Have regular diabetes screenings. This checks your fasting blood sugar level. Have the screening done:  Once every three years after age 40 if you are at a normal weight and have a low risk for diabetes.  More often and at a younger age if you are overweight or have a high risk for diabetes. What should I know about preventing infection? Hepatitis B If you have a higher risk for hepatitis B, you should be screened for this virus. Talk with your health care provider to find out if you are at risk for hepatitis B infection. Hepatitis C Testing is recommended for:  Everyone born from 1945 through 1965.  Anyone with known risk factors for hepatitis C. Sexually transmitted infections (STIs)  Get screened for STIs, including gonorrhea and chlamydia, if: ? You are sexually active and are younger than 72 years of age. ? You are older than 72 years of age and your health care provider tells you that you are at risk for this type of infection. ? Your sexual activity has changed since you were last screened, and you are at increased risk for chlamydia or gonorrhea. Ask your health care provider   if you are at risk.  Ask your health care provider about whether you are at high risk for HIV. Your health care provider may recommend a prescription medicine to help prevent HIV infection. If you choose to take medicine to prevent HIV, you should first get tested for HIV. You should then be tested every 3 months for as long as you are taking the medicine. Pregnancy  If you are about to stop having your period (premenopausal) and  you may become pregnant, seek counseling before you get pregnant.  Take 400 to 800 micrograms (mcg) of folic acid every day if you become pregnant.  Ask for birth control (contraception) if you want to prevent pregnancy. Osteoporosis and menopause Osteoporosis is a disease in which the bones lose minerals and strength with aging. This can result in bone fractures. If you are 65 years old or older, or if you are at risk for osteoporosis and fractures, ask your health care provider if you should:  Be screened for bone loss.  Take a calcium or vitamin D supplement to lower your risk of fractures.  Be given hormone replacement therapy (HRT) to treat symptoms of menopause. Follow these instructions at home: Lifestyle  Do not use any products that contain nicotine or tobacco, such as cigarettes, e-cigarettes, and chewing tobacco. If you need help quitting, ask your health care provider.  Do not use street drugs.  Do not share needles.  Ask your health care provider for help if you need support or information about quitting drugs. Alcohol use  Do not drink alcohol if: ? Your health care provider tells you not to drink. ? You are pregnant, may be pregnant, or are planning to become pregnant.  If you drink alcohol: ? Limit how much you use to 0-1 drink a day. ? Limit intake if you are breastfeeding.  Be aware of how much alcohol is in your drink. In the [place] one drink equals one 12 oz bottle of beer (355 mL), one 5 oz glass of wine (148 mL), or one 1 oz glass of hard liquor (44 mL). General instructions  Schedule regular health, dental, and eye exams.  Stay current with your vaccines.  Tell your health care provider if: ? You often feel depressed. ? You have ever been abused or do not feel safe at home. Summary  Adopting a healthy lifestyle and getting preventive care are important in promoting health and wellness.  Follow your health care provider's instructions about healthy  diet, exercising, and getting tested or screened for diseases.  Follow your health care provider's instructions on monitoring your cholesterol and blood pressure. This information is not intended to replace advice given to you by your health care provider. Make sure you discuss any questions you have with your health care provider. Document Revised: 08/30/2018 Document Reviewed: 08/30/2018 Elsevier Patient Education  2021 [employer] Inc.  

## 2020-12-30 NOTE — Progress Notes (Signed)
Subjective:  Patient ID: [name], female    DOB: August 17, 1951  Age: 70 y.o. MRN: [id number]  CC: New Patient (Initial Visit) (Establish care), Annual Exam, Anemia, Hypertension, and Hyperlipidemia  This visit occurred during the SARS-CoV-2 public health emergency.  Safety protocols were in place, including screening questions prior to the visit, additional usage of staff PPE, and extensive cleaning of exam room while observing appropriate contact time as indicated for disinfecting solutions.    HPI [name] presents for a CPX and to establish.  She has a hx of anemia and neuropathy.  She has a history of tachycardia.  She saw someone else recently and was found to have a low heart rate so it was recommended that she decrease her metoprolol Nguyen.  Since then she has had recurrent episodes of intermittent palpitations with a sensation that her heart rate is elevated.  With this she feels short of breath and lightheaded.  She denies syncope or near syncope.  She said she has this sensation about every other day.  She is followed by cardiology.  History [name] has a past medical history of Arthritis, Blood transfusion, Breast cancer ([place]) (2009), Chicken pox, Colon cancer ([place]) (2003), Depression, Diverticulitis, Family history of breast cancer (10/24/2017), Gastritis, GERD (gastroesophageal reflux disease), Heart murmur, Hepatitis, Hiatal hernia, Hypertension, Lupus ([place]), Multiple gastric ulcers, Personal history of radiation therapy (2009), Rheumatic fever, Urinary incontinence, and UTI (urinary tract infection).   She has a past surgical history that includes Tonsillectomy; Appendectomy; Abdominal hysterectomy; Tubal ligation; Breast surgery; Cholecystectomy; Total knee arthroplasty (08/30/2011); Replacement total knee (Left); Esophagogastroduodenoscopy (egd) with propofol (N/A, 07/25/2018); biopsy (07/25/2018); and Breast excisional biopsy (Left, 1998).   Her family history  includes Arthritis in her maternal grandmother; Breast cancer (age of onset: 36) in her maternal aunt; Breast cancer (age of onset: 41) in her mother; Cancer in an other family member; Cancer (age of onset: 58) in her paternal aunt; Cancer (age of onset: 35) in her paternal grandmother; Heart attack in her father; Hypertension in her brother, mother, sister, and another family member; Kidney disease in her brother; Other in her father; Other (age of onset: 58) in her maternal grandmother; Stroke in her mother.She reports that she has never smoked. She has never used smokeless tobacco. She reports that she does not drink alcohol and does not use drugs.  Outpatient Medications Prior to Visit  Medication Sig Dispense Refill  . cholecalciferol (VITAMIN D) 1000 units tablet Take 1,000 Units by mouth daily.    . hydrochlorothiazide (HYDRODIURIL) 25 MG tablet Take 25 mg by mouth daily.    Marland Kitchen losartan (COZAAR) 25 MG tablet Take 1 tablet (25 mg total) by mouth every other day. 45 tablet 3  . Multiple Vitamins-Minerals (EMERGEN-C IMMUNE PLUS PO) Take 1 tablet by mouth daily.    Marland Kitchen omeprazole (PRILOSEC) 40 MG capsule Take 1 capsule (40 mg total) by mouth daily. 30 capsule 5  . potassium chloride (KLOR-CON) 10 MEQ tablet Take 1 tablet (10 mEq total) by mouth daily. 90 tablet 4  . tiZANidine (ZANAFLEX) 2 MG tablet as needed.    Marland Kitchen ibuprofen (ADVIL) 800 MG tablet Take 800 mg by mouth every 8 (eight) hours as needed.    . meloxicam (MOBIC) 15 MG tablet 15 mg as needed.    . metoprolol succinate (TOPROL-XL) 25 MG 24 hr tablet Take 2 tablets (50 mg total) by mouth every morning. (Patient taking differently: Take 25 mg by mouth daily.) 60 tablet 1  .  tamoxifen (NOLVADEX) 20 MG tablet Take 1 tablet (20 mg total) by mouth daily. 90 tablet 4   No facility-administered medications prior to visit.    ROS Review of Systems  Constitutional: Negative for appetite change, diaphoresis, fatigue and fever.  HENT: Negative.    Respiratory: Positive for shortness of breath. Negative for chest tightness and wheezing.   Cardiovascular: Positive for palpitations. Negative for chest pain and leg swelling.  Gastrointestinal: Negative for abdominal pain, constipation, diarrhea, nausea and vomiting.  Endocrine: Negative.   Genitourinary: Negative.   Musculoskeletal: Negative.  Negative for arthralgias and myalgias.  Skin: Negative.  Negative for color change.  Neurological: Negative.  Negative for dizziness, weakness, light-headedness, numbness and headaches.  Hematological: Negative for adenopathy. Does not bruise/bleed easily.  Psychiatric/Behavioral: Negative.     Objective:  BP (!) 148/88 (BP Location: Left Arm, Patient Position: Sitting, Cuff Size: Large)   Pulse 66   Temp 98.4 F (36.9 C) (Oral)   Resp 16   Ht 5' 7.5" (1.715 m)   Wt 208 lb (94.3 kg)   SpO2 97%   BMI 32.10 kg/m   Physical Exam Vitals reviewed.  HENT:     Nose: Nose normal.     Mouth/Throat:     Mouth: Mucous membranes are moist.  Eyes:     General: No scleral icterus.    Conjunctiva/sclera: Conjunctivae normal.  Cardiovascular:     Rate and Rhythm: Normal rate and regular rhythm.     Heart sounds: No murmur heard.     Comments: EKG- NSR, 62 bpm Moderate LVH is new Flat T waves over the lateral leads is new TWI in III is not new Pulmonary:     Effort: Pulmonary effort is normal.     Breath sounds: No stridor. No wheezing, rhonchi or rales.  Abdominal:     General: Abdomen is protuberant. Bowel sounds are normal. There is no distension.     Palpations: Abdomen is soft. There is no hepatomegaly, splenomegaly or mass.     Tenderness: There is no abdominal tenderness.  Musculoskeletal:        General: No swelling. Normal range of motion.     Cervical back: Neck supple.     Right lower leg: No edema.     Left lower leg: No edema.  Lymphadenopathy:     Cervical: No cervical adenopathy.  Skin:    General: Skin is warm and  dry.     Coloration: Skin is not jaundiced or pale.     Findings: No bruising or rash.  Neurological:     General: No focal deficit present.     Mental Status: She is alert.  Psychiatric:        Mood and Affect: Mood normal.        Behavior: Behavior normal.     Lab Results  Component Value Date   WBC 5.1 12/30/2020   HGB 12.7 12/30/2020   HCT 38.2 12/30/2020   PLT 178.0 12/30/2020   GLUCOSE 78 12/30/2020   CHOL 159 12/30/2020   TRIG 109.0 12/30/2020   HDL 61.30 12/30/2020   LDLCALC 76 12/30/2020   ALT 16 10/30/2020   AST 26 10/30/2020   NA 141 12/30/2020   K 3.9 12/30/2020   CL 103 12/30/2020   CREATININE 0.75 12/30/2020   BUN 13 12/30/2020   CO2 28 12/30/2020   TSH 1.14 12/30/2020   INR 1.09 05/30/2015   HGBA1C 5.7 12/30/2020    Assessment & Plan:   [name]  was seen today for new patient (initial visit), annual exam, anemia, hypertension and hyperlipidemia.  Diagnoses and all orders for this visit:  Palpitations- Her heart rate is 62 on EKG today.  Her labs are negative for secondary causes.  She will continue the current Nguyen of metoprolol.  I recommended that she undergo a cardiac event monitor to try to identify if she has a dysrhythmia. -     EKG 12-Lead -     TSH; Future -     Magnesium; Future -     Basic metabolic panel; Future -     Cancel: CARDIAC EVENT MONITOR; Future -     metoprolol succinate (TOPROL-XL) 25 MG 24 hr tablet; Take 1 tablet (25 mg total) by mouth daily. -     Basic metabolic panel -     Magnesium -     TSH -     CARDIAC EVENT MONITOR; Future  Hypokalemia- Her potassium level is normal now. -     Magnesium; Future -     Basic metabolic panel; Future -     Basic metabolic panel -     Magnesium  Essential hypertension- Her blood pressure is adequately well controlled. -     TSH; Future -     metoprolol succinate (TOPROL-XL) 25 MG 24 hr tablet; Take 1 tablet (25 mg total) by mouth daily. -     TSH  Deficiency anemia- Her H&H are  normal now.  I will screen her for vitamin deficiencies. -     CBC with Differential/Platelet; Future -     Vitamin B12; Future -     Iron; Future -     Folate; Future -     Ferritin; Future -     Vitamin B1; Future -     Vitamin B1 -     Ferritin -     Folate -     Iron -     Vitamin B12 -     CBC with Differential/Platelet  Hyperlipidemia with target LDL less than 130- She does not have an elevated ASCVD risk score so I did not recommend a statin for CV risk reduction. -     Lipid panel; Future -     TSH; Future -     TSH -     Lipid panel  Encounter for general adult medical examination with abnormal findings- Exam completed, labs reviewed, vaccines reviewed and updated, cancer screenings are up-to-date, patient education was given.  LVH (left ventricular hypertrophy) due to hypertensive disease, without heart failure- She has a normal volume status and her blood pressure is adequately well controlled.  Need for Td vaccine -     Tdap vaccine greater than or equal to 7yo IM  Need for shingles vaccine -     Zoster Vaccine Adjuvanted [facility]) injection; Inject 0.5 mLs into the muscle once for 1 Nguyen.  Need for pneumococcal vaccination -     Pneumococcal polysaccharide vaccine 23-valent greater than or equal to 2yo subcutaneous/IM  Hyperglycemia- Her blood sugar is normal now. -     Hemoglobin A1c; Future -     Hemoglobin A1c  Colon cancer screening -     Cologuard  Thiamine deficiency neuropathy -     thiamine (VITAMIN B-1) 50 MG tablet; Take 1 tablet (50 mg total) by mouth daily.   I have discontinued [name]'s ibuprofen, tamoxifen, and meloxicam. I have also changed her metoprolol succinate. Additionally, I am having her start  on Shingrix and thiamine. Lastly, I am having her maintain her cholecalciferol, omeprazole, potassium chloride, Multiple Vitamins-Minerals (EMERGEN-C IMMUNE PLUS PO), losartan, hydrochlorothiazide, and tiZANidine.  Meds ordered this  encounter  Medications  . metoprolol succinate (TOPROL-XL) 25 MG 24 hr tablet    Sig: Take 1 tablet (25 mg total) by mouth daily.    Dispense:  90 tablet    Refill:  1  . Zoster Vaccine Adjuvanted [facility]) injection    Sig: Inject 0.5 mLs into the muscle once for 1 Nguyen.    Dispense:  0.5 mL    Refill:  1  . thiamine (VITAMIN B-1) 50 MG tablet    Sig: Take 1 tablet (50 mg total) by mouth daily.    Dispense:  90 tablet    Refill:  1     Follow-up: Return in about 6 weeks (around 02/10/2021).  Jaclyn Calico, MD

## 2021-01-03 LAB — VITAMIN B1: Vitamin B1 (Thiamine): 7 nmol/L — ABNORMAL LOW (ref 8–30)

## 2021-01-03 MED ORDER — VITAMIN B-1 50 MG PO TABS: 50.0000 mg | ORAL_TABLET | Freq: Every day | ORAL | 1 refills | Status: DC

## 2021-01-14 ENCOUNTER — Other Ambulatory Visit: Payer: Self-pay | Admitting: Orthopedic Surgery

## 2021-01-14 ENCOUNTER — Ambulatory Visit (INDEPENDENT_AMBULATORY_CARE_PROVIDER_SITE_OTHER): Payer: Medicare HMO

## 2021-01-14 DIAGNOSIS — M79601 Pain in right arm: Secondary | ICD-10-CM

## 2021-01-14 DIAGNOSIS — R002 Palpitations: Secondary | ICD-10-CM

## 2021-01-20 LAB — COLOGUARD
COLOGUARD: NEGATIVE
Cologuard: NEGATIVE

## 2021-02-03 ENCOUNTER — Ambulatory Visit
Admission: RE | Admit: 2021-02-03 | Discharge: 2021-02-03 | Disposition: A | Discharge status: Home / Self Care | Payer: Medicare HMO | Source: Ambulatory Visit | Attending: Orthopedic Surgery | Admitting: Orthopedic Surgery

## 2021-02-03 DIAGNOSIS — M79601 Pain in right arm: Secondary | ICD-10-CM

## 2021-02-03 MED ORDER — GADOBENATE DIMEGLUMINE 529 MG/ML IV SOLN
19.0000 mL | Freq: Once | INTRAVENOUS | Status: AC | PRN
  Administered 2021-02-03: 19 mL via INTRAVENOUS

## 2021-02-06 ENCOUNTER — Telehealth: Payer: Self-pay | Admitting: Cardiovascular Disease

## 2021-02-06 NOTE — Telephone Encounter (Signed)
   Primary Cardiologist: Skeet Latch, MD  Chart reviewed as part of pre-operative protocol coverage. Given past medical history and time since last visit, based on ACC/AHA guidelines, RHEDA KASSAB would be at acceptable risk for the planned procedure without further cardiovascular testing.   Patient was advised that if she develops new symptoms prior to surgery to contact our office to arrange a follow-up appointment.  She verbalized understanding.  I will route this recommendation to the requesting party via Epic fax function and remove from pre-op pool.  Please call with questions.  Jossie Ng. Demauri Advincula NP-C    02/06/2021, 10:25 AM [address] Office [phone] Fax [phone]

## 2021-02-06 NOTE — Telephone Encounter (Signed)
   [facility] Pre-operative Risk Assessment    Patient Name: [name]  DOB: 1950/12/31  MRN: [id number]   HEARTCARE STAFF: - Please ensure there is not already an duplicate clearance open for this procedure. - Under Visit Info/Reason for Call, type in Other and utilize the format Clearance MM/DD/YY or Clearance TBD. Do not use dashes or single digits. - If request is for dental extraction, please clarify the # of teeth to be extracted.  Request for surgical clearance:  1. What type of surgery is being performed? Right elbow removal of benign tumor    2. When is this surgery scheduled? 03/06/21  3. What type of clearance is required (medical clearance vs. Pharmacy clearance to hold med vs. Both)? Medical & Pharmacy  4. Are there any medications that need to be held prior to surgery and how long? Any necessary med   5. Practice name and name of physician performing surgery? [facility] Dr. Frederik Pear  6. What is the office phone number? [phone]   7.   What is the office fax number? [phone]  8.   Anesthesia type (None, local, MAC, general) ? Choice    [name] 02/06/2021, 9:47 AM  _________________________________________________________________   (provider comments below)

## 2021-02-10 ENCOUNTER — Telehealth: Payer: Self-pay | Admitting: Internal Medicine

## 2021-02-10 NOTE — Telephone Encounter (Signed)
Clearance form has been received and given to PCP for review.

## 2021-02-10 NOTE — Telephone Encounter (Signed)
I have reached out to [place], LVM with [facility] fax number to send the surgical clearance form to.

## 2021-02-10 NOTE — Telephone Encounter (Signed)
I have not seen it.

## 2021-02-10 NOTE — Telephone Encounter (Signed)
[name] from [place] called re: surgical clearance from Dr. Ronnald Ramp  Please call her at [phone] if you need it re-faxed.  Patient's surgery is 6.17.22

## 2021-02-17 ENCOUNTER — Other Ambulatory Visit: Payer: Self-pay

## 2021-02-17 ENCOUNTER — Ambulatory Visit (INDEPENDENT_AMBULATORY_CARE_PROVIDER_SITE_OTHER): Payer: Medicare HMO | Admitting: Internal Medicine

## 2021-02-17 ENCOUNTER — Encounter: Payer: Self-pay | Admitting: Internal Medicine

## 2021-02-17 VITALS — BP 126/74 | HR 73 | Temp 98.9°F | Ht 67.5 in | Wt 211.0 lb

## 2021-02-17 DIAGNOSIS — E5111 Dry beriberi: Secondary | ICD-10-CM

## 2021-02-17 DIAGNOSIS — G4700 Insomnia, unspecified: Secondary | ICD-10-CM | POA: Insufficient documentation

## 2021-02-17 DIAGNOSIS — Z6832 Body mass index (BMI) 32.0-32.9, adult: Secondary | ICD-10-CM

## 2021-02-17 DIAGNOSIS — I10 Essential (primary) hypertension: Secondary | ICD-10-CM | POA: Diagnosis not present

## 2021-02-17 DIAGNOSIS — E6609 Other obesity due to excess calories: Secondary | ICD-10-CM

## 2021-02-17 DIAGNOSIS — F5104 Psychophysiologic insomnia: Secondary | ICD-10-CM

## 2021-02-17 DIAGNOSIS — R638 Other symptoms and signs concerning food and fluid intake: Secondary | ICD-10-CM | POA: Insufficient documentation

## 2021-02-17 HISTORY — DX: Insomnia, unspecified: G47.00

## 2021-02-17 MED ORDER — VITAMIN B-1 50 MG PO TABS: 50.0000 mg | ORAL_TABLET | Freq: Every day | ORAL | 1 refills | Status: DC

## 2021-02-17 MED ORDER — BELSOMRA 15 MG PO TABS: 15.0000 mg | ORAL_TABLET | Freq: Every evening | ORAL | 1 refills | Status: DC | PRN

## 2021-02-17 NOTE — Telephone Encounter (Signed)
Form has been signed and faxed back 

## 2021-02-17 NOTE — Patient Instructions (Signed)

## 2021-02-17 NOTE — Progress Notes (Signed)
Subjective:  Patient ID: [name], female    DOB: 28-Feb-1951  Age: 70 y.o. MRN: [id number]  CC: Hypertension  This visit occurred during the SARS-CoV-2 public health emergency.  Safety protocols were in place, including screening questions prior to the visit, additional usage of staff PPE, and extensive cleaning of exam room while observing appropriate contact time as indicated for disinfecting solutions.    HPI [name] presents for f/up -   She has had no more palpitations.  Her event monitor was unremarkable.  She would like to see someone to help her lose weight.  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  She needs surgical clearance.  She has not started taking thiamine yet.  Outpatient Medications Prior to Visit  Medication Sig Dispense Refill  . cholecalciferol (VITAMIN D) 1000 units tablet Take 1,000 Units by mouth daily.    . hydrochlorothiazide (HYDRODIURIL) 25 MG tablet Take 25 mg by mouth daily.    Marland Kitchen losartan (COZAAR) 25 MG tablet Take 1 tablet (25 mg total) by mouth every other day. 45 tablet 3  . metoprolol succinate (TOPROL-XL) 25 MG 24 hr tablet Take 1 tablet (25 mg total) by mouth daily. 90 tablet 1  . Multiple Vitamins-Minerals (EMERGEN-C IMMUNE PLUS PO) Take 1 tablet by mouth daily.    Marland Kitchen omeprazole (PRILOSEC) 40 MG capsule Take 1 capsule (40 mg total) by mouth daily. 30 capsule 5  . potassium chloride (KLOR-CON) 10 MEQ tablet Take 1 tablet (10 mEq total) by mouth daily. 90 tablet 4  . tiZANidine (ZANAFLEX) 2 MG tablet as needed.    . thiamine (VITAMIN B-1) 50 MG tablet Take 1 tablet (50 mg total) by mouth daily. 90 tablet 1   No facility-administered medications prior to visit.    ROS Review of Systems  Constitutional: Negative for diaphoresis, fatigue and unexpected weight change.  HENT: Negative.   Eyes: Negative.   Respiratory: Negative for chest tightness, shortness of breath and  wheezing.   Cardiovascular: Negative for chest pain, palpitations and leg swelling.  Gastrointestinal: Negative for abdominal pain, constipation, diarrhea, nausea and vomiting.  Endocrine: Negative.   Genitourinary: Negative.  Negative for difficulty urinating.  Musculoskeletal: Negative.  Negative for myalgias.  Skin: Negative.  Negative for color change.  Neurological: Negative.  Negative for dizziness and weakness.  Hematological: Negative for adenopathy. Does not bruise/bleed easily.  Psychiatric/Behavioral: Positive for sleep disturbance. Negative for dysphoric mood. The patient is not nervous/anxious.     Objective:  BP 126/74 (BP Location: Left Arm, Patient Position: Sitting, Cuff Size: Large)   Pulse 73   Temp 98.9 F (37.2 C) (Oral)   Ht 5' 7.5" (1.715 m)   Wt 211 lb (95.7 kg)   SpO2 97%   BMI 32.56 kg/m   BP Readings from Last 3 Encounters:  02/17/21 126/74  12/30/20 (!) 148/88  10/30/20 (!) 144/75    Wt Readings from Last 3 Encounters:  02/17/21 211 lb (95.7 kg)  12/30/20 208 lb (94.3 kg)  10/30/20 206 lb 12.8 oz (93.8 kg)    Physical Exam Vitals reviewed.  HENT:     Nose: Nose normal.     Mouth/Throat:     Mouth: Mucous membranes are moist.  Eyes:     General: No scleral icterus.    Conjunctiva/sclera: Conjunctivae normal.  Cardiovascular:     Rate and Rhythm: Normal rate and regular rhythm.  Heart sounds: No murmur heard.   Pulmonary:     Effort: Pulmonary effort is normal.     Breath sounds: No stridor. No wheezing or rhonchi.  Abdominal:     General: Abdomen is flat. Bowel sounds are normal. There is no distension.     Palpations: Abdomen is soft. There is no hepatomegaly, splenomegaly or mass.     Tenderness: There is no abdominal tenderness.  Musculoskeletal:        General: Normal range of motion.     Cervical back: Neck supple.     Right lower leg: No edema.     Left lower leg: No edema.  Lymphadenopathy:     Cervical: No cervical  adenopathy.  Skin:    General: Skin is warm and dry.  Neurological:     General: No focal deficit present.     Mental Status: She is alert.     Lab Results  Component Value Date   WBC 5.1 12/30/2020   HGB 12.7 12/30/2020   HCT 38.2 12/30/2020   PLT 178.0 12/30/2020   GLUCOSE 78 12/30/2020   CHOL 159 12/30/2020   TRIG 109.0 12/30/2020   HDL 61.30 12/30/2020   LDLCALC 76 12/30/2020   ALT 16 10/30/2020   AST 26 10/30/2020   NA 141 12/30/2020   K 3.9 12/30/2020   CL 103 12/30/2020   CREATININE 0.75 12/30/2020   BUN 13 12/30/2020   CO2 28 12/30/2020   TSH 1.14 12/30/2020   INR 1.09 05/30/2015   HGBA1C 5.7 12/30/2020    MR FOREARM RIGHT W WO CONTRAST  Result Date: 02/05/2021 CLINICAL DATA:  Evaluate elbow mass. EXAM: MRI OF THE RIGHT FOREARM WITHOUT AND WITH CONTRAST TECHNIQUE: Multiplanar, multisequence MR imaging of the right elbow was performed before and after the administration of intravenous contrast. CONTRAST:  37mL MULTIHANCE GADOBENATE DIMEGLUMINE 529 MG/ML IV SOLN COMPARISON:  Radiographs 02/28/2018 FINDINGS: The area of the patient's palpable abnormality is marked over the region of the olecranon process. Nonspecific inflammations/edema and enhancement. I do not see discrete rim enhancing olecranon bursitis but it is possible this is some type of chronic scarring or chronic inflammation. Slightly more distally there are several calcifications in the subcutaneous fat which have been present since prior radiographs from 2019. There is also mild insertional tendinopathy involving the triceps tendon but no tear/rupture. Mild elbow joint degenerative changes but no joint effusion or loose bodies. The medial and lateral collateral ligaments are intact. The biceps, brachialis and common flexor and extensor tendons are intact. Mild common extensor tendinopathy. The elbow and proximal forearm musculature are unremarkable. No muscle tear, myositis or mass. IMPRESSION: 1. The patient's  palpable abnormality appears to correspond to chronic scarring or chronic inflammation. No discrete rim enhancing olecranon bursitis or discrete mass. 2. Mild insertional tendinopathy involving the triceps tendon but no tear/rupture. 3. Mild elbow joint degenerative changes but no joint effusion or loose bodies. 4. No muscle tear, myositis or mass. Electronically Signed   By: Marijo Sanes M.D.   On: 02/05/2021 10:16    Assessment & Plan:   [name] was seen today for hypertension.  Diagnoses and all orders for this visit:  Thiamine deficiency neuropathy- She agrees to start taking the thiamine supplement. -     Vitamin B1; Future -     thiamine (VITAMIN B-1) 50 MG tablet; Take 1 tablet (50 mg total) by mouth daily.  Essential hypertension- Her blood pressure is well controlled.  Her MICA risk is 0.1%.  I recommended that she proceed with surgery.  Class 1 obesity due to excess calories with serious comorbidity and body mass index (BMI) of 32.0 to 32.9 in adult -     Amb Ref to Medical Weight Management  Psychophysiological insomnia -     Suvorexant (BELSOMRA) 15 MG TABS; Take 15 mg by mouth at bedtime as needed.   I am having [name] start on Nguyen. I am also having her maintain her cholecalciferol, omeprazole, potassium chloride, Multiple Vitamins-Minerals (EMERGEN-C IMMUNE PLUS PO), losartan, hydrochlorothiazide, tiZANidine, metoprolol succinate, and thiamine.  Meds ordered this encounter  Medications  . thiamine (VITAMIN B-1) 50 MG tablet    Sig: Take 1 tablet (50 mg total) by mouth daily.    Dispense:  90 tablet    Refill:  1  . Suvorexant (BELSOMRA) 15 MG TABS    Sig: Take 15 mg by mouth at bedtime as needed.    Dispense:  90 tablet    Refill:  1     Follow-up: Return in about 6 months (around 08/19/2021).  Scarlette Calico, MD

## 2021-03-06 ENCOUNTER — Other Ambulatory Visit: Payer: Self-pay | Admitting: Orthopedic Surgery

## 2021-03-06 DIAGNOSIS — R2231 Localized swelling, mass and lump, right upper limb: Secondary | ICD-10-CM | POA: Diagnosis not present

## 2021-03-25 ENCOUNTER — Encounter (HOSPITAL_COMMUNITY): Payer: Self-pay | Admitting: Oncology

## 2021-03-25 ENCOUNTER — Emergency Department (HOSPITAL_COMMUNITY): Payer: Medicare HMO

## 2021-03-25 ENCOUNTER — Other Ambulatory Visit: Payer: Self-pay

## 2021-03-25 ENCOUNTER — Emergency Department (HOSPITAL_COMMUNITY)
Admission: EM | Admit: 2021-03-25 | Discharge: 2021-03-25 | Disposition: A | Discharge status: Home / Self Care | Payer: Medicare HMO | Attending: Emergency Medicine | Admitting: Emergency Medicine

## 2021-03-25 DIAGNOSIS — R110 Nausea: Secondary | ICD-10-CM | POA: Diagnosis not present

## 2021-03-25 DIAGNOSIS — J80 Acute respiratory distress syndrome: Secondary | ICD-10-CM | POA: Diagnosis not present

## 2021-03-25 DIAGNOSIS — R1084 Generalized abdominal pain: Secondary | ICD-10-CM | POA: Insufficient documentation

## 2021-03-25 DIAGNOSIS — R000 Tachycardia, unspecified: Secondary | ICD-10-CM | POA: Diagnosis not present

## 2021-03-25 DIAGNOSIS — Z79899 Other long term (current) drug therapy: Secondary | ICD-10-CM | POA: Insufficient documentation

## 2021-03-25 DIAGNOSIS — Z96653 Presence of artificial knee joint, bilateral: Secondary | ICD-10-CM | POA: Insufficient documentation

## 2021-03-25 DIAGNOSIS — Z91040 Latex allergy status: Secondary | ICD-10-CM | POA: Insufficient documentation

## 2021-03-25 DIAGNOSIS — I10 Essential (primary) hypertension: Secondary | ICD-10-CM | POA: Diagnosis not present

## 2021-03-25 DIAGNOSIS — R509 Fever, unspecified: Secondary | ICD-10-CM | POA: Diagnosis not present

## 2021-03-25 DIAGNOSIS — R059 Cough, unspecified: Secondary | ICD-10-CM | POA: Diagnosis not present

## 2021-03-25 DIAGNOSIS — U071 COVID-19: Secondary | ICD-10-CM | POA: Diagnosis not present

## 2021-03-25 DIAGNOSIS — K219 Gastro-esophageal reflux disease without esophagitis: Secondary | ICD-10-CM | POA: Insufficient documentation

## 2021-03-25 DIAGNOSIS — Z85038 Personal history of other malignant neoplasm of large intestine: Secondary | ICD-10-CM | POA: Insufficient documentation

## 2021-03-25 DIAGNOSIS — R112 Nausea with vomiting, unspecified: Secondary | ICD-10-CM | POA: Diagnosis present

## 2021-03-25 LAB — CBC WITH DIFFERENTIAL/PLATELET
Abs Immature Granulocytes: 0.02 10*3/uL (ref 0.00–0.07)
Basophils Absolute: 0 10*3/uL (ref 0.0–0.1)
Basophils Relative: 0 %
Eosinophils Absolute: 0 10*3/uL (ref 0.0–0.5)
Eosinophils Relative: 0 %
HCT: 34.4 % — ABNORMAL LOW (ref 36.0–46.0)
Hemoglobin: 11.6 g/dL — ABNORMAL LOW (ref 12.0–15.0)
Immature Granulocytes: 0 %
Lymphocytes Relative: 14 %
Lymphs Abs: 0.6 10*3/uL — ABNORMAL LOW (ref 0.7–4.0)
MCH: 30.9 pg (ref 26.0–34.0)
MCHC: 33.7 g/dL (ref 30.0–36.0)
MCV: 91.7 fL (ref 80.0–100.0)
Monocytes Absolute: 0.6 10*3/uL (ref 0.1–1.0)
Monocytes Relative: 14 %
Neutro Abs: 3.3 10*3/uL (ref 1.7–7.7)
Neutrophils Relative %: 72 %
Platelets: 187 10*3/uL (ref 150–400)
RBC: 3.75 MIL/uL — ABNORMAL LOW (ref 3.87–5.11)
RDW: 13.2 % (ref 11.5–15.5)
WBC: 4.6 10*3/uL (ref 4.0–10.5)
nRBC: 0 % (ref 0.0–0.2)

## 2021-03-25 LAB — COMPREHENSIVE METABOLIC PANEL
ALT: 26 U/L (ref 0–44)
AST: 32 U/L (ref 15–41)
Albumin: 3.5 g/dL (ref 3.5–5.0)
Alkaline Phosphatase: 67 U/L (ref 38–126)
Anion gap: 10 (ref 5–15)
BUN: 10 mg/dL (ref 8–23)
CO2: 24 mmol/L (ref 22–32)
Calcium: 9.1 mg/dL (ref 8.9–10.3)
Chloride: 103 mmol/L (ref 98–111)
Creatinine, Ser: 0.74 mg/dL (ref 0.44–1.00)
GFR, Estimated: >60 mL/min (ref >60)
Glucose, Bld: 121 mg/dL — ABNORMAL HIGH (ref 70–99)
Potassium: 3.1 mmol/L — ABNORMAL LOW (ref 3.5–5.1)
Sodium: 137 mmol/L (ref 135–145)
Total Bilirubin: 0.5 mg/dL (ref 0.3–1.2)
Total Protein: 7 g/dL (ref 6.5–8.1)

## 2021-03-25 LAB — URINALYSIS, ROUTINE W REFLEX MICROSCOPIC
Bilirubin Urine: NEGATIVE
Glucose, UA: NEGATIVE mg/dL
Hgb urine dipstick: NEGATIVE
Ketones, ur: NEGATIVE mg/dL
Leukocytes,Ua: NEGATIVE
Nitrite: NEGATIVE
Protein, ur: NEGATIVE mg/dL
Specific Gravity, Urine: 1.017 (ref 1.005–1.030)
pH: 7 (ref 5.0–8.0)

## 2021-03-25 LAB — LIPASE, BLOOD: Lipase: 26 U/L (ref 11–51)

## 2021-03-25 LAB — RESP PANEL BY RT-PCR (FLU A&B, COVID) ARPGX2
Influenza A by PCR: NEGATIVE
Influenza B by PCR: NEGATIVE
SARS Coronavirus 2 by RT PCR: POSITIVE — AB

## 2021-03-25 MED ORDER — ONDANSETRON HCL 4 MG/2ML IJ SOLN: 4.0000 mg | Freq: Once | INTRAMUSCULAR | Status: DC

## 2021-03-25 MED ORDER — NIRMATRELVIR/RITONAVIR (PAXLOVID)TABLET: 3.0000 | ORAL_TABLET | Freq: Two times a day (BID) | ORAL | 0 refills | Status: AC

## 2021-03-25 MED ORDER — LACTATED RINGERS IV BOLUS
1000.0000 mL | Freq: Once | INTRAVENOUS | Status: AC
  Administered 2021-03-25: 1000 mL via INTRAVENOUS

## 2021-03-25 MED ORDER — ONDANSETRON 4 MG PO TBDP
4.0000 mg | ORAL_TABLET | Freq: Once | ORAL | Status: AC
  Administered 2021-03-25: 4 mg via ORAL
  Filled 2021-03-25: qty 1

## 2021-03-25 MED ORDER — HYDROCODONE-ACETAMINOPHEN 5-325 MG PO TABS
2.0000 | ORAL_TABLET | Freq: Once | ORAL | Status: AC
Start: 2021-03-25 — End: 2021-03-25
  Administered 2021-03-25: 2 via ORAL
  Filled 2021-03-25: qty 2

## 2021-03-25 MED ORDER — FENTANYL CITRATE (PF) 100 MCG/2ML IJ SOLN: 50.0000 ug | Freq: Once | INTRAMUSCULAR | Status: DC

## 2021-03-25 NOTE — ED Provider Notes (Signed)
[place] EMERGENCY DEPARTMENT Provider Note  CSN: [id number] Arrival date & time: 03/25/21 3007    History Chief Complaint  Patient presents with   Abdominal Pain    [name] is a 70 y.o. female with history of lupus, in remission, not currently on any medications, reports her husband was diagnosed with Covid 2 days ago. She did not initially have any symptoms but reports she began having nausea, vomiting, diarrhea and abdominal pain last night. She has had myalgias 'all over' as well. She denies fever to me but triage note mentions fever at home. She has had a dry cough. Home Covid test was negative. She has had two vaccines and a booster. Denies dysuria.    Past Medical History:  Diagnosis Date   Arthritis    R knee, hands    Blood transfusion    1986- post-partial hysterectomy   Breast cancer ([place]) 2009   right lumpectomy and radiation   Chicken pox    Colon cancer ([place]) 2003   per pt report, cancer found in a colon polyp in 2003, procedure done in [place].  did not require surgery, chemo etc.     Depression    Diverticulitis    Family history of breast cancer 10/24/2017   Gastritis    GERD (gastroesophageal reflux disease)    recently taken off anti reflux tx   Heart murmur    functional murmur, echo in [place]- long time ago    Hepatitis    Hep. A- 1978   Hiatal hernia    Hypertension    Lupus ([place])    Multiple gastric ulcers    Personal history of radiation therapy 2009   Rheumatic fever    Urinary incontinence    UTI (urinary tract infection)     Past Surgical History:  Procedure Laterality Date   [place] & tubal ligation    BIOPSY  07/25/2018   Procedure: BIOPSY;  Surgeon: Milus Banister, MD;  Location: [facility] ENDOSCOPY;  Service: Endoscopy;;   BREAST EXCISIONAL BIOPSY Left 1998   BREAST SURGERY     R breast lumpectomy   CHOLECYSTECTOMY     1978- open    ESOPHAGOGASTRODUODENOSCOPY (EGD) WITH PROPOFOL  N/A 07/25/2018   Procedure: ESOPHAGOGASTRODUODENOSCOPY (EGD) WITH PROPOFOL;  Surgeon: Milus Banister, MD;  Location: [facility] END[facility]COPY;  Service: Endoscopy;  Laterality: N/A;   REPLACEMENT TOTAL KNEE Left    TONSILLECTOMY     1954   TOTAL KNEE ARTHROPLASTY  08/30/2011   Procedure: TOTAL KNEE ARTHROPLASTY;  Surgeon: Kerin Salen;  Location: [facility];  Service: Orthopedics;  Laterality: Right;  Right Total Knee Arthroplasty   TUBAL LIGATION      Family History  Problem Relation Age of Onset   Heart attack Father    Other Father    Cancer Other    Hypertension Other    Breast cancer Mother 43   Hypertension Mother    Stroke Mother    Hypertension Sister    Hypertension Brother    Kidney disease Brother    Breast cancer Maternal Aunt 55   Cancer Paternal Aunt 65       type unk   Other Maternal Grandmother 102       brain tumor dx at 15- did not bx or do additional testing on tumor   Arthritis Maternal Grandmother    Cancer Paternal Grandmother 68  type unk   Anesthesia problems Neg Hx    Hypotension Neg Hx    Malignant hyperthermia Neg Hx    Pseudochol deficiency Neg Hx    Colon cancer Neg Hx    Esophageal cancer Neg Hx    Stomach cancer Neg Hx    Pancreatic cancer Neg Hx    Liver disease Neg Hx     Social History   Tobacco Use   Smoking status: Never   Smokeless tobacco: Never  Vaping Use   Vaping Use: Never used  Substance Use Topics   Alcohol use: No   Drug use: No     Home Medications Prior to Admission medications   Medication Sig Start Date End Date Taking? Authorizing Provider  nirmatrelvir/ritonavir EUA (PAXLOVID) TABS Take 3 tablets by mouth 2 (two) times daily for 5 days. Patient GFR is >60. Take nirmatrelvir (150 mg) two tablets twice daily for 5 days and ritonavir (100 mg) one tablet twice daily for 5 days. 03/25/21 03/30/21 Yes Truddie Hidden, MD  cholecalciferol (VITAMIN D) 1000 units tablet Take 1,000 Units by mouth daily.    [provider]  hydrochlorothiazide (HYDRODIURIL) 25 MG tablet Take 25 mg by mouth daily.    [provider]  losartan (COZAAR) 25 MG tablet Take 1 tablet (25 mg total) by mouth every other day. 09/29/20   Erlene Quan, PA-C  metoprolol succinate (TOPROL-XL) 25 MG 24 hr tablet Take 1 tablet (25 mg total) by mouth daily. 12/30/20   Janith Lima, MD  Multiple Vitamins-Minerals (EMERGEN-C IMMUNE PLUS PO) Take 1 tablet by mouth daily.    [provider]  omeprazole (PRILOSEC) 40 MG capsule Take 1 capsule (40 mg total) by mouth daily. 02/17/18   Magrinat, Virgie Dad, MD  potassium chloride (KLOR-CON) 10 MEQ tablet Take 1 tablet (10 mEq total) by mouth daily. 08/21/19   Magrinat, Virgie Dad, MD  Suvorexant (BELSOMRA) 15 MG TABS Take 15 mg by mouth at bedtime as needed. 02/17/21   Janith Lima, MD  thiamine (VITAMIN B-1) 50 MG tablet Take 1 tablet (50 mg total) by mouth daily. 02/17/21   Janith Lima, MD  tiZANidine (ZANAFLEX) 2 MG tablet as needed.    [provider]     Allergies    Iodine, Iohexol, Latex, Penicillins, Tetracycline, Other, Penicillin g, and Sulfa antibiotics   Review of Systems   Review of Systems A comprehensive review of systems was completed and negative except as noted in HPI.    Physical Exam BP (!) 141/74 (BP Location: Right Arm)   Pulse 92   Temp (!) 100.5 F (38.1 C) (Oral)   Resp 18   Ht 5\' 7"  (1.702 m)   Wt 93 kg   SpO2 99%   BMI 32.11 kg/m   Physical Exam Vitals and nursing note reviewed.  Constitutional:      Appearance: Normal appearance.  HENT:     Head: Normocephalic and atraumatic.     Nose: Nose normal.     Mouth/Throat:     Mouth: Mucous membranes are moist.  Eyes:     Extraocular Movements: Extraocular movements intact.     Conjunctiva/sclera: Conjunctivae normal.  Cardiovascular:     Rate and Rhythm: Normal rate.  Pulmonary:     Effort: Pulmonary effort is normal.     Breath sounds: Normal breath sounds. No  wheezing or rales.  Abdominal:     General: Abdomen is flat.     Palpations: Abdomen  is soft.     Tenderness: There is generalized abdominal tenderness. There is guarding. Negative signs include Murphy's sign and McBurney's sign.  Musculoskeletal:        General: No swelling. Normal range of motion.     Cervical back: Neck supple.  Skin:    General: Skin is warm and dry.  Neurological:     General: No focal deficit present.     Mental Status: She is alert.  Psychiatric:        Mood and Affect: Mood normal.     ED Results / Procedures / Treatments   Labs (all labs ordered are listed, but only abnormal results are displayed) Labs Reviewed  RESP PANEL BY RT-PCR (FLU A&B, COVID) ARPGX2 - Abnormal; Notable for the following components:      Result Value   SARS Coronavirus 2 by RT PCR POSITIVE (*)    All other components within normal limits  COMPREHENSIVE METABOLIC PANEL - Abnormal; Notable for the following components:   Potassium 3.1 (*)    Glucose, Bld 121 (*)    All other components within normal limits  CBC WITH DIFFERENTIAL/PLATELET - Abnormal; Notable for the following components:   RBC 3.75 (*)    Hemoglobin 11.6 (*)    HCT 34.4 (*)    Lymphs Abs 0.6 (*)    All other components within normal limits  URINALYSIS, ROUTINE W REFLEX MICROSCOPIC - Abnormal; Notable for the following components:   APPearance HAZY (*)    All other components within normal limits  LIPASE, BLOOD    EKG None  Radiology CT Abdomen Pelvis Wo Contrast  Result Date: 03/25/2021 CLINICAL DATA:  Cough, fever, chills, nausea, diagnosed with COVID-19 2 days ago. History of breast cancer, colon cancer, GERD, hiatal hernia, hypertension, lupus, gastric ulcers EXAM: CT ABDOMEN AND PELVIS WITHOUT CONTRAST TECHNIQUE: Multidetector CT imaging of the abdomen and pelvis was performed following the standard protocol without IV contrast. Sagittal and coronal MPR images reconstructed from axial data set. No oral  contrast administered. COMPARISON:  07/23/2018, 11/30/2007; correlation bone scan 05/01/2008 FINDINGS: Lower chest: Minimal atelectasis LEFT lower lobe. Hepatobiliary: Nonspecific 6 mm low-attenuation focus LEFT lobe liver image 15. Liver otherwise normal appearance. Gallbladder surgically absent. Pancreas: Normal appearance Spleen: Normal appearance Adrenals/Urinary Tract: Adrenal glands, kidneys, ureters, and bladder normal appearance Stomach/Bowel: Appendix surgically absent by history. Hiatal hernia. Distal colonic diverticulosis without evidence of diverticulitis. Bowel loops otherwise unremarkable. Vascular/Lymphatic: Atherosclerotic calcifications aorta and iliac arteries without aneurysm. Scattered pelvic phleboliths. No adenopathy. Reproductive: Uterus surgically absent.  Normal appearing ovaries. Other: No free air or free fluid. No hernia or inflammatory process. Musculoskeletal: Slight asymmetric sclerosis of the RIGHT ischium and RIGHT inferior pubic ramus extending to posterior column of acetabulum, unchanged from prior CTs and demonstrating mildly increased uptake of tracer on remote bone scan, question Paget's disease. IMPRESSION: Distal colonic diverticulosis without evidence of diverticulitis. Hiatal hernia. No acute intra-abdominal or intrapelvic abnormalities. 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 Aortic Atherosclerosis (ICD10-I70.0). Electronically Signed   By: Lavonia Dana M.D.   On: 03/25/2021 12:55    Procedures Procedures  Medications Ordered in the ED Medications  ondansetron (ZOFRAN) injection 4 mg (has no administration in time range)  fentaNYL (SUBLIMAZE) injection 50 mcg (has no administration in time range)  lactated ringers bolus 1,000 mL (1,000 mLs Intravenous New Bag/Given 03/25/21 1435)  HYDROcodone-acetaminophen (NORCO/VICODIN) 5-325 MG per tablet 2 tablet (2 tablets Oral Given 03/25/21 1337)  ondansetron (ZOFRAN-ODT) disintegrating  tablet 4 mg (4 mg Oral Given 03/25/21 1337)     MDM Rules/Calculators/A&P MDM Patient's symptoms not definitely due to Covid, abdominal tenderness is out of proportion to typical viral syndrome. Will check labs, send for CT. IVF, pain/nausea meds for symptom relief.   ED Course  I have reviewed the triage vital signs and the nursing notes.  Pertinent labs & imaging results that were available during my care of the patient were reviewed by me and considered in my medical decision making (see chart for details).  Clinical Course as of 03/25/21 1545  Wed Mar 25, 2021  1245 Covid is positive.  [CS]  0289 CT is neg for acute process.  [CS]  0228 UA neg. Difficult IV stick, they were unable to get blood with IV team. She was given oral pain meds/antipyretics for fever.  [CS]  1505 CBC is unremarkable.  [CS]  4069 BMP lipase are normal. Patient is feeling better. Will discharge with Rx for Paxlovid, PCP follow up. Standard quarantine and return instructions.  [CS]    Clinical Course User Index [CS] Truddie Hidden, MD    Final Clinical Impression(s) / ED Diagnoses Final diagnoses:  [id number]    Rx / DC Orders ED Discharge Orders          Ordered    nirmatrelvir/ritonavir EUA (PAXLOVID) TABS  2 times daily        03/25/21 1543             Truddie Hidden, MD 03/25/21 [phone]

## 2021-03-25 NOTE — ED Triage Notes (Signed)
Pt's husband dx w/ COVID 2 days ago.  Pt remained asymptomatic until 2100 last night.  Pt developed cough, fever, chills, nausea.  EKG enroute unremarkable.

## 2021-03-25 NOTE — ED Notes (Signed)
MD notified of positive COVID.  Also notified on delay d/t no IV access.

## 2021-03-25 NOTE — ED Notes (Signed)
Per IV team, IV start completed, unable to collect blood for lab work. Primary RN and EDP made aware.  Main phlebotomy notified of need for lab draw.

## 2021-03-25 NOTE — ED Notes (Signed)
Awaiting IV team for IV insert and blood draw for lab collection.

## 2021-03-25 NOTE — ED Notes (Signed)
[phone], Elenore Paddy, Pt's daughter would like to be updated.

## 2021-05-12 ENCOUNTER — Encounter: Payer: Self-pay | Admitting: Internal Medicine

## 2021-05-16 ENCOUNTER — Encounter: Payer: Self-pay | Admitting: Internal Medicine

## 2021-06-11 ENCOUNTER — Other Ambulatory Visit: Payer: Self-pay

## 2021-06-11 ENCOUNTER — Other Ambulatory Visit: Payer: Self-pay | Admitting: *Deleted

## 2021-06-11 ENCOUNTER — Inpatient Hospital Stay: Payer: Medicare HMO

## 2021-06-11 ENCOUNTER — Inpatient Hospital Stay: Payer: Medicare HMO | Attending: Oncology | Admitting: Oncology

## 2021-06-11 VITALS — BP 141/72 | HR 75 | Temp 97.7°F | Resp 17 | Wt 204.9 lb

## 2021-06-11 DIAGNOSIS — Z881 Allergy status to other antibiotic agents status: Secondary | ICD-10-CM | POA: Diagnosis not present

## 2021-06-11 DIAGNOSIS — I10 Essential (primary) hypertension: Secondary | ICD-10-CM | POA: Insufficient documentation

## 2021-06-11 DIAGNOSIS — C50411 Malignant neoplasm of upper-outer quadrant of right female breast: Secondary | ICD-10-CM

## 2021-06-11 DIAGNOSIS — Z9049 Acquired absence of other specified parts of digestive tract: Secondary | ICD-10-CM | POA: Diagnosis not present

## 2021-06-11 DIAGNOSIS — Z8719 Personal history of other diseases of the digestive system: Secondary | ICD-10-CM | POA: Insufficient documentation

## 2021-06-11 DIAGNOSIS — Z79899 Other long term (current) drug therapy: Secondary | ICD-10-CM | POA: Diagnosis not present

## 2021-06-11 DIAGNOSIS — Z882 Allergy status to sulfonamides status: Secondary | ICD-10-CM | POA: Diagnosis not present

## 2021-06-11 DIAGNOSIS — Z841 Family history of disorders of kidney and ureter: Secondary | ICD-10-CM | POA: Insufficient documentation

## 2021-06-11 DIAGNOSIS — Z888 Allergy status to other drugs, medicaments and biological substances status: Secondary | ICD-10-CM | POA: Diagnosis not present

## 2021-06-11 DIAGNOSIS — Z170 Estrogen receptor positive status [ER+]: Secondary | ICD-10-CM

## 2021-06-11 DIAGNOSIS — Z79810 Long term (current) use of selective estrogen receptor modulators (SERMs): Secondary | ICD-10-CM | POA: Insufficient documentation

## 2021-06-11 DIAGNOSIS — Z85038 Personal history of other malignant neoplasm of large intestine: Secondary | ICD-10-CM | POA: Diagnosis not present

## 2021-06-11 DIAGNOSIS — K2970 Gastritis, unspecified, without bleeding: Secondary | ICD-10-CM

## 2021-06-11 DIAGNOSIS — E669 Obesity, unspecified: Secondary | ICD-10-CM

## 2021-06-11 DIAGNOSIS — Z880 Allergy status to penicillin: Secondary | ICD-10-CM | POA: Diagnosis not present

## 2021-06-11 DIAGNOSIS — Z809 Family history of malignant neoplasm, unspecified: Secondary | ICD-10-CM | POA: Insufficient documentation

## 2021-06-11 DIAGNOSIS — R300 Dysuria: Secondary | ICD-10-CM

## 2021-06-11 DIAGNOSIS — Z8249 Family history of ischemic heart disease and other diseases of the circulatory system: Secondary | ICD-10-CM | POA: Insufficient documentation

## 2021-06-11 DIAGNOSIS — Z87440 Personal history of urinary (tract) infections: Secondary | ICD-10-CM | POA: Insufficient documentation

## 2021-06-11 DIAGNOSIS — E119 Type 2 diabetes mellitus without complications: Secondary | ICD-10-CM | POA: Insufficient documentation

## 2021-06-11 DIAGNOSIS — Z8261 Family history of arthritis: Secondary | ICD-10-CM | POA: Insufficient documentation

## 2021-06-11 DIAGNOSIS — Z923 Personal history of irradiation: Secondary | ICD-10-CM | POA: Insufficient documentation

## 2021-06-11 DIAGNOSIS — Z0289 Encounter for other administrative examinations: Secondary | ICD-10-CM

## 2021-06-11 DIAGNOSIS — Z823 Family history of stroke: Secondary | ICD-10-CM | POA: Insufficient documentation

## 2021-06-11 DIAGNOSIS — Z803 Family history of malignant neoplasm of breast: Secondary | ICD-10-CM | POA: Insufficient documentation

## 2021-06-11 LAB — URINALYSIS, COMPLETE (UACMP) WITH MICROSCOPIC
Bacteria, UA: NONE SEEN
Bilirubin Urine: NEGATIVE
Glucose, UA: NEGATIVE mg/dL
Leukocytes,Ua: NEGATIVE
Nitrite: NEGATIVE
Protein, ur: NEGATIVE mg/dL
Specific Gravity, Urine: 1.01 (ref 1.005–1.030)
pH: 6.5 (ref 5.0–8.0)

## 2021-06-11 LAB — CBC WITH DIFFERENTIAL/PLATELET
Abs Immature Granulocytes: 0.01 10*3/uL (ref 0.00–0.07)
Basophils Absolute: 0 10*3/uL (ref 0.0–0.1)
Basophils Relative: 1 %
Eosinophils Absolute: 0.2 10*3/uL (ref 0.0–0.5)
Eosinophils Relative: 4 %
HCT: 37 % (ref 36.0–46.0)
Hemoglobin: 12 g/dL (ref 12.0–15.0)
Immature Granulocytes: 0 %
Lymphocytes Relative: 45 %
Lymphs Abs: 2.2 10*3/uL (ref 0.7–4.0)
MCH: 30.5 pg (ref 26.0–34.0)
MCHC: 32.4 g/dL (ref 30.0–36.0)
MCV: 94.1 fL (ref 80.0–100.0)
Monocytes Absolute: 0.8 10*3/uL (ref 0.1–1.0)
Monocytes Relative: 15 %
Neutro Abs: 1.7 10*3/uL (ref 1.7–7.7)
Neutrophils Relative %: 35 %
Platelets: 197 10*3/uL (ref 150–400)
RBC: 3.93 MIL/uL (ref 3.87–5.11)
RDW: 13 % (ref 11.5–15.5)
WBC: 4.9 10*3/uL (ref 4.0–10.5)
nRBC: 0 % (ref 0.0–0.2)

## 2021-06-11 LAB — RETICULOCYTES
Immature Retic Fract: 9.5 % (ref 2.3–15.9)
RBC.: 3.89 MIL/uL (ref 3.87–5.11)
Retic Count, Absolute: 75.1 10*3/uL (ref 19.0–186.0)
Retic Ct Pct: 1.9 % (ref 0.4–3.1)

## 2021-06-11 LAB — COMPREHENSIVE METABOLIC PANEL
ALT: 23 U/L (ref 0–44)
AST: 28 U/L (ref 15–41)
Albumin: 3.6 g/dL (ref 3.5–5.0)
Alkaline Phosphatase: 104 U/L (ref 38–126)
Anion gap: 9 (ref 5–15)
BUN: 11 mg/dL (ref 8–23)
CO2: 24 mmol/L (ref 22–32)
Calcium: 9.5 mg/dL (ref 8.9–10.3)
Chloride: 107 mmol/L (ref 98–111)
Creatinine, Ser: 0.87 mg/dL (ref 0.44–1.00)
GFR, Estimated: >60 mL/min (ref >60)
Glucose, Bld: 81 mg/dL (ref 70–99)
Potassium: 3.6 mmol/L (ref 3.5–5.1)
Sodium: 140 mmol/L (ref 135–145)
Total Bilirubin: 0.4 mg/dL (ref 0.3–1.2)
Total Protein: 7.4 g/dL (ref 6.5–8.1)

## 2021-06-11 LAB — VITAMIN B12: Vitamin B-12: 556 pg/mL (ref 180–914)

## 2021-06-11 LAB — FOLATE: Folate: 39.9 ng/mL (ref >5.9)

## 2021-06-11 LAB — SAVE SMEAR(SSMR), FOR PROVIDER SLIDE REVIEW

## 2021-06-11 NOTE — Progress Notes (Signed)
[place]  [phone] [phone]     ID: [name] DOB: 1951-01-30  MR#: [id number]  GNO#:[id number]  Patient Care Team: Janith Lima, MD as PCP - General (Internal Medicine) Skeet Latch, MD as PCP - Cardiology (Cardiology) Gavin Pound, MD as Consulting Physician (Rheumatology) Willia Craze, NP as Nurse Practitioner (Gastroenterology) Ella Golomb, Virgie Dad, MD as Consulting Physician (Oncology) Leanora Cover, MD as Consulting Physician (Orthopedic Surgery) OTHER MD:   CHIEF COMPLAINT: Estrogen receptor positive breast cancer  CURRENT TREATMENT: tamoxifen    INTERVAL HISTORY: [name] returns today for follow-up of her estrogen receptor positive breast cancer.   She went off tamoxifen at her last visit here 6 months ago.  She did not notice any significant changes 1 way or the other.  She continues to be anxious about being off that medication  She is due for annual mammography. Her most recent bilateral diagnostic mammography and left axilla ultrasound at [facility] on 05/12/2020 showed: breast density category B; no evidence of malignancy in either breast.    REVIEW OF SYSTEMS: [name] tells me she had COVID in June.  She weathered it well.  Her husband however did much worse.  He does have poorly controlled diabetes.  For exercise [name] is walking.  Recently she went to [place] and did a lot of walking there and lost about 12 pounds she says she now has 2 great-grandchildren she is very proud of.  A detailed review of systems was otherwise stable.   COVID 19 VACCINATION STATUS: [place] x3, most recently 07/2020; infection 03/25/2021   BREAST CANCER HISTORY: From Dr. Dana Allan earlier summary:  "#1 patient is status post lumpectomy with sentinel lymph node biopsy followed by radiation to the right breast. In December 2009 patient began aromatase inhibitor but could not tolerate it.  #2 therefore she began in  March 2010 tamoxifen 20 mg daily. However she has experienced significant amount of aches and pains and in December 2012 discontinued it. At this time she does not want to go back on any kind of antiestrogen therapy due to side effects.  #3 patient would like to go back on tamoxifen 20 mg daily. She recently attended finding your new normal seminar. And because of that she wants to she would like to go back on the tamoxifen. We discussed side effects again. She was sent to her pharmacy."  Her subsequent history is as detailed below   PAST MEDICAL HISTORY: Past Medical History:  Diagnosis Date   Arthritis    R knee, hands    Blood transfusion    1986- post-partial hysterectomy   Breast cancer ([place]) 2009   right lumpectomy and radiation   Chicken pox    Colon cancer ([place]) 2003   per pt report, cancer found in a colon polyp in 2003, procedure done in [place].  did not require surgery, chemo etc.     Depression    Diverticulitis    Family history of breast cancer 10/24/2017   Gastritis    GERD (gastroesophageal reflux disease)    recently taken off anti reflux tx   Heart murmur    functional murmur, echo in [place]- long time ago    Hepatitis    Hep. A- 1978   Hiatal hernia    Hypertension    Lupus ([place])    Multiple gastric ulcers    Personal history of radiation therapy 2009   Rheumatic fever    Urinary incontinence  UTI (urinary tract infection)     PAST SURGICAL HISTORY: Past Surgical History:  Procedure Laterality Date   ABDOMINAL HYSTERECTOMY     1986   APPENDECTOMY     1978 & tubal ligation    BIOPSY  07/25/2018   Procedure: BIOPSY;  Surgeon: Milus Banister, MD;  Location[facility]COPY;  Service: Endoscopy;;   BREAST EXCISIONAL BIOPSY Left 1998   BREAST SURGERY     R breast lumpectomy   CHOLECYSTECTOMY     1978- open    ESOPHAGOGASTRODUODENOSCOPY (EGD) WITH PROPOFOL N/A 07/25/2018   Procedure: ESOPHAGOGASTRODUODENOSCOPY (EGD) WITH PROPOFOL;  Surgeon: Milus Banister,  MD;  Location: [facility] ENDOSCOPY;  Service: Endoscopy;  Laterality: N/A;   REPLACEMENT TOTAL KNEE Left    TONSILLECTOMY     1954   TOTAL KNEE ARTHROPLASTY  08/30/2011   Procedure: TOTAL KNEE ARTHROPLASTY;  Surgeon: Kerin Salen;  Location: [facility];  Service: Orthopedics;  Laterality: Right;  Right Total Knee Arthroplasty   TUBAL LIGATION      FAMILY HISTORY Family History  Problem Relation Age of Onset   Heart attack Father    Other Father    Cancer Other    Hypertension Other    Breast cancer Mother 60   Hypertension Mother    Stroke Mother    Hypertension Sister    Hypertension Brother    Kidney disease Brother    Breast cancer Maternal Aunt 67   Cancer Paternal Aunt 56       type unk   Other Maternal Grandmother 79       brain tumor dx at 67- did not bx or do additional testing on tumor   Arthritis Maternal Grandmother    Cancer Paternal Grandmother 68       type unk   Anesthesia problems Neg Hx    Hypotension Neg Hx    Malignant hyperthermia Neg Hx    Pseudochol deficiency Neg Hx    Colon cancer Neg Hx    Esophageal cancer Neg Hx    Stomach cancer Neg Hx    Pancreatic cancer Neg Hx    Liver disease Neg Hx    the patient's father died from a heart attack at the age of 38. The patient's mother died at the age of 74. She had a history of breast cancer and one of her sisters was diagnosed with breast cancer at the age of 52. The patient had 5 brothers and 5 sisters. There is no other history of breast or ovarian cancer in the family to her knowledge.   GYNECOLOGIC HISTORY:  No LMP recorded. Patient has had a hysterectomy. Menarche age 29, first live birth age 1. The patient is GX P3. She had a simple hysterectomy in 1986 (no salpingo-oophorectomy).   SOCIAL HISTORY: (Updated February 2022). 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  She  remarried in 2011. Her husband Laveda Abbe is retired from [employer]. The patient has one adopted son in addition to her own children. 3 of her children live in town 1 in [place]. She has 7 grandchildren. She attends [employer] locally.    ADVANCED DIRECTIVES: Not in place   HEALTH MAINTENANCE: Social History   Tobacco Use   Smoking status: Never   Smokeless tobacco: Never  Vaping Use   Vaping Use:  Never used  Substance Use Topics   Alcohol use: No   Drug use: No     Colonoscopy:  PAP:  Bone density: 04/30/2008 was normal  Lipid panel:  Allergies  Allergen Reactions   Iodine Anaphylaxis   Iohexol Anaphylaxis     Code: HIVES, Desc: PT IS ALLERGIC TO IODINATED CONTRAST; REACTION INCLUDES FACIAL SWELLING,HIVES,RESPIRATORY DISTRESS;PT HASN'T HAD SCAN W/PREMEDS.;REFUSED CONTRAST OF ANY KIND!  KR    Latex Anaphylaxis   Penicillins Anaphylaxis and Rash    Has patient had a PCN reaction causing immediate rash, facial/tongue/throat swelling, SOB or lightheadedness with hypotension: Yes Has patient had a PCN reaction causing severe rash involving mucus membranes or skin necrosis: Yes Has patient had a PCN reaction that required hospitalization: No Has patient had a PCN reaction occurring within the last 10 years: No If all of the above answers are "NO", then may proceed with Cephalosporin use.    Tetracycline Anaphylaxis   Other Other (See Comments)   Penicillin G Other (See Comments)   Sulfa Antibiotics Hives    Current Outpatient Medications  Medication Sig Dispense Refill   cholecalciferol (VITAMIN D) 1000 units tablet Take 1,000 Units by mouth daily.     hydrochlorothiazide (HYDRODIURIL) 25 MG tablet Take 25 mg by mouth daily.     losartan (COZAAR) 25 MG tablet Take 1 tablet (25 mg total) by mouth every other day. 45 tablet 3   metoprolol succinate (TOPROL-XL) 25 MG 24 hr tablet Take 1 tablet (25 mg total) by mouth daily. 90 tablet 1    Multiple Vitamins-Minerals (EMERGEN-C IMMUNE PLUS PO) Take 1 tablet by mouth daily.     omeprazole (PRILOSEC) 40 MG capsule Take 1 capsule (40 mg total) by mouth daily. 30 capsule 5   potassium chloride (KLOR-CON) 10 MEQ tablet Take 1 tablet (10 mEq total) by mouth daily. 90 tablet 4   Suvorexant (BELSOMRA) 15 MG TABS Take 15 mg by mouth at bedtime as needed. 90 tablet 1   thiamine (VITAMIN B-1) 50 MG tablet Take 1 tablet (50 mg total) by mouth daily. 90 tablet 1   tiZANidine (ZANAFLEX) 2 MG tablet as needed.     No current facility-administered medications for this visit.    OBJECTIVE: African-American woman in no acute distress  Vitals:   06/11/21 1508  BP: (!) 141/72  Pulse: 75  Resp: 17  Temp: 97.7 F (36.5 C)  SpO2: 100%      Body mass index is 32.09 kg/m.    ECOG FS:1 - Symptomatic but completely ambulatory   Sclerae unicteric, EOMs intact Wearing a mask No cervical or supraclavicular adenopathy Lungs no rales or rhonchi Heart regular rate and rhythm Abd soft, nontender, positive bowel sounds MSK no focal spinal tenderness, no upper extremity lymphedema Neuro: nonfocal, well oriented, appropriate affect Breasts: The right breast is unremarkable.  The left breast is status postlumpectomy and radiation.  There is no evidence of disease recurrence.  Both axillae are benign.   LAB RESULTS:  CMP     Component Value Date/Time   NA 140 06/11/2021 1442   NA 140 09/04/2020 1037   NA 141 01/26/2017 1508   K 3.6 06/11/2021 1442   K 3.4 (L) 01/26/2017 1508   CL 107 06/11/2021 1442   CL 102 01/25/2013 0909   CO2 24 06/11/2021 1442   CO2 29 01/26/2017 1508   GLUCOSE 81 06/11/2021 1442   GLUCOSE 91 01/26/2017 1508   GLUCOSE 81 01/25/2013 0909   BUN 11 06/11/2021  1442   BUN 14 09/04/2020 1037   BUN 11.5 01/26/2017 1508   CREATININE 0.87 06/11/2021 1442   CREATININE 1.04 (H) 02/15/2019 1436   CREATININE 0.8 01/26/2017 1508   CALCIUM 9.5 06/11/2021 1442   CALCIUM 9.5  01/26/2017 1508   PROT 7.4 06/11/2021 1442   PROT 7.1 01/26/2017 1508   ALBUMIN 3.6 06/11/2021 1442   ALBUMIN 3.5 01/26/2017 1508   AST 28 06/11/2021 1442   AST 24 02/15/2019 1436   AST 22 01/26/2017 1508   ALT 23 06/11/2021 1442   ALT 17 02/15/2019 1436   ALT 17 01/26/2017 1508   ALKPHOS 104 06/11/2021 1442   ALKPHOS 72 01/26/2017 1508   BILITOT 0.4 06/11/2021 1442   BILITOT 0.3 02/15/2019 1436   BILITOT 0.38 01/26/2017 1508   GFRNONAA >60 06/11/2021 1442   GFRNONAA 54 (L) 02/15/2019 1436   GFRAA 91 09/04/2020 1037   GFRAA >60 02/15/2019 1436    INo results found for: SPEP, UPEP  Lab Results  Component Value Date   WBC 4.9 06/11/2021   NEUTROABS 1.7 06/11/2021   HGB 12.0 06/11/2021   HCT 37.0 06/11/2021   MCV 94.1 06/11/2021   PLT 197 06/11/2021      Chemistry      Component Value Date/Time   NA 140 06/11/2021 1442   NA 140 09/04/2020 1037   NA 141 01/26/2017 1508   K 3.6 06/11/2021 1442   K 3.4 (L) 01/26/2017 1508   CL 107 06/11/2021 1442   CL 102 01/25/2013 0909   CO2 24 06/11/2021 1442   CO2 29 01/26/2017 1508   BUN 11 06/11/2021 1442   BUN 14 09/04/2020 1037   BUN 11.5 01/26/2017 1508   CREATININE 0.87 06/11/2021 1442   CREATININE 1.04 (H) 02/15/2019 1436   CREATININE 0.8 01/26/2017 1508      Component Value Date/Time   CALCIUM 9.5 06/11/2021 1442   CALCIUM 9.5 01/26/2017 1508   ALKPHOS 104 06/11/2021 1442   ALKPHOS 72 01/26/2017 1508   AST 28 06/11/2021 1442   AST 24 02/15/2019 1436   AST 22 01/26/2017 1508   ALT 23 06/11/2021 1442   ALT 17 02/15/2019 1436   ALT 17 01/26/2017 1508   BILITOT 0.4 06/11/2021 1442   BILITOT 0.3 02/15/2019 1436   BILITOT 0.38 01/26/2017 1508       Lab Results  Component Value Date   LABCA2 16 11/09/2010    No components found for: [id number]  No results for input(s): INR in the last 168 hours.  Urinalysis    Component Value Date/Time   COLORURINE YELLOW 03/25/2021 1322   APPEARANCEUR HAZY (A)  03/25/2021 1322   LABSPEC 1.017 03/25/2021 1322   LABSPEC 1.015 01/26/2017 1523   PHURINE 7.0 03/25/2021 1322   GLUCOSEU NEGATIVE 03/25/2021 1322   GLUCOSEU Negative 01/26/2017 1523   HGBUR NEGATIVE 03/25/2021 1322   HGBUR negative 01/22/2010 1551   BILIRUBINUR NEGATIVE 03/25/2021 1322   BILIRUBINUR Negative 01/26/2017 1523   KETONESUR NEGATIVE 03/25/2021 1322   PROTEINUR NEGATIVE 03/25/2021 1322   UROBILINOGEN 0.2 01/26/2017 1523   NITRITE NEGATIVE 03/25/2021 1322   LEUKOCYTESUR NEGATIVE 03/25/2021 1322   LEUKOCYTESUR Small 01/26/2017 1523    STUDIES: No results found.    ASSESSMENT: 70 y.o. 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  (2) status post right lumpectomy 03/20/2008 for invasive lobular carcinoma, multifocal,  pT1a pNX, stage IA, grade 2, strongly estrogen and progesterone receptor positive, HER-2 negative. with negative margins.  (3) status post right axillary sentinel lymph node sampling 04/17/2008, the single sentinel lymph node being clear  (4) Oncotype score of 10 predicts an outside the breast risk of recurrence of 7% if the patient's only systemic therapy is tamoxifen for 5 years.  (5) Additional right breast biopsy 05/23/2008 to evaluate microcalcifications seen on pre-radiotherapy mammography showed only atypical lobular hyperplasia  (6) Completed radiation to the right breast 07/26/2008 (50 gray +14 gray boost).  (7) Started on anastrozole November 2009, with poor tolerance  (8). Started on tamoxifen March 2010, continued to December 2012, resumed December 2014   (a) tamoxifen discontinued February 2022  (9) status post simple hysterectomy 1986 (no salpingo-oophorectomy)  (10) genetics testing 10/31/2017 through the Common Hereditary Cancers Panel offered by [facility] found no deleterious  mutations in APC, ATM, AXIN2, BARD1, BMPR1A, BRCA1, BRCA2, BRIP1, CDH1, CDKN2A (p14ARF), CDKN2A (p16INK4a), CKD4, CHEK2, CTNNA1, DICER1, EPCAM (Deletion/duplication testing only), GREM1 (promoter region deletion/duplication testing only), KIT, MEN1, MLH1, MSH2, MSH3, MSH6, MUTYH, NBN, NF1, NHTL1, PALB2, PDGFRA, PMS2, POLD1, POLE, PTEN, RAD50, RAD51C, RAD51D, SDHB, SDHC, SDHD, SMAD4, SMARCA4. STK11, TP53, TSC1, TSC2, and VHL.  The following genes were evaluated for sequence changes only: SDHA and HOXB13 c.251G>A variant only.  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    PLAN: [name] is now 13 years out from definitive surgery for her breast cancer with no evidence of disease recurrence.  This is very favorable.  She completed more than 10 years of tamoxifen.  She understands we do not have data for continuing that medication further.  Some patients switched to raloxifene at this point and that can be an option for her at some point in the future.  At this point however I am comfortable with observation alone  She is more than 10 years out from her surgery but nevertheless feels very vulnerable.  She derives a clear benefit at least in her mind from seeing [name] on a 29-monthbasis and we can certainly accommodate that.  She knows to call for any other issue that may develop before the next visit.  Total encounter time 20 minutes.*Sarajane JewsC. Prudy Candy, MD 06/11/21 4:41 PM Medical On[facility] H[facility] [id number]. [phone]   Fax. [phone]  I, KWilburn Mylar am acting as scribe for Dr. GVirgie Dad Mahiya Kercheval.  I, GLurline DelMD, have reviewed the above documentation for accuracy and completeness, and I agree with the above.   *Total Encounter Time as defined by the Centers for Medicare and Medicaid Services includes, in addition to the face-to-face time of a patient visit  (documented in the note above) non-face-to-face time: obtaining and reviewing outside history, ordering and reviewing medications, tests or procedures, care coordination (communications with other health care professionals or caregivers) and documentation in the medical record.

## 2021-06-12 LAB — IRON AND TIBC
Iron: 156 ug/dL — ABNORMAL HIGH (ref 41–142)
Saturation Ratios: 40 % (ref 21–57)
TIBC: 389 ug/dL (ref 236–444)
UIBC: 233 ug/dL (ref 120–384)

## 2021-06-12 LAB — FERRITIN: Ferritin: 18 ng/mL (ref 11–307)

## 2021-06-13 LAB — URINE CULTURE: Culture: <10000 — AB

## 2021-06-15 ENCOUNTER — Other Ambulatory Visit: Payer: Self-pay | Admitting: Adult Health

## 2021-06-15 DIAGNOSIS — R310 Gross hematuria: Secondary | ICD-10-CM

## 2021-06-15 NOTE — Progress Notes (Signed)
Patient with + blood on urinalysis in absence of infection and some back pain with h/o kidney stones.  Ordering CT stone protocol to further evaluate.   Wilber Bihari, NP

## 2021-06-18 ENCOUNTER — Ambulatory Visit (INDEPENDENT_AMBULATORY_CARE_PROVIDER_SITE_OTHER): Payer: Medicare HMO | Admitting: Bariatrics

## 2021-06-18 ENCOUNTER — Other Ambulatory Visit: Payer: Self-pay

## 2021-06-18 ENCOUNTER — Encounter (INDEPENDENT_AMBULATORY_CARE_PROVIDER_SITE_OTHER): Payer: Self-pay | Admitting: Bariatrics

## 2021-06-18 ENCOUNTER — Ambulatory Visit
Admission: RE | Admit: 2021-06-18 | Discharge: 2021-06-18 | Disposition: A | Discharge status: Home / Self Care | Payer: Medicare HMO | Source: Ambulatory Visit | Attending: Oncology | Admitting: Oncology

## 2021-06-18 VITALS — BP 148/72 | HR 63 | Temp 98.2°F | Ht 67.0 in | Wt 199.0 lb

## 2021-06-18 DIAGNOSIS — Z1331 Encounter for screening for depression: Secondary | ICD-10-CM

## 2021-06-18 DIAGNOSIS — R7303 Prediabetes: Secondary | ICD-10-CM

## 2021-06-18 DIAGNOSIS — Z170 Estrogen receptor positive status [ER+]: Secondary | ICD-10-CM

## 2021-06-18 DIAGNOSIS — E559 Vitamin D deficiency, unspecified: Secondary | ICD-10-CM

## 2021-06-18 DIAGNOSIS — R5383 Other fatigue: Secondary | ICD-10-CM | POA: Diagnosis not present

## 2021-06-18 DIAGNOSIS — R0602 Shortness of breath: Secondary | ICD-10-CM

## 2021-06-18 DIAGNOSIS — I10 Essential (primary) hypertension: Secondary | ICD-10-CM | POA: Diagnosis not present

## 2021-06-18 DIAGNOSIS — Z1231 Encounter for screening mammogram for malignant neoplasm of breast: Secondary | ICD-10-CM | POA: Diagnosis not present

## 2021-06-18 DIAGNOSIS — E669 Obesity, unspecified: Secondary | ICD-10-CM

## 2021-06-18 DIAGNOSIS — K449 Diaphragmatic hernia without obstruction or gangrene: Secondary | ICD-10-CM

## 2021-06-18 DIAGNOSIS — M069 Rheumatoid arthritis, unspecified: Secondary | ICD-10-CM

## 2021-06-18 DIAGNOSIS — Z6831 Body mass index (BMI) 31.0-31.9, adult: Secondary | ICD-10-CM

## 2021-06-18 DIAGNOSIS — C50411 Malignant neoplasm of upper-outer quadrant of right female breast: Secondary | ICD-10-CM

## 2021-06-18 NOTE — Progress Notes (Signed)
Dear Dr. Ronnald Ramp,   Thank you for referring [name] Nguyen to our clinic. The following note includes my evaluation and treatment recommendations.  Chief Complaint:   [name] Nguyen (MR# [id number]) is a 70 y.o. female who presents for evaluation and treatment of [name] and related comorbidities. Current BMI is Body mass index is 31.17 kg/m. [name] Nguyen has been struggling with her weight for many years and has been unsuccessful in either losing weight, maintaining weight loss, or reaching her healthy weight goal.  [name] Nguyen is currently in the action stage of change and ready to dedicate time achieving and maintaining a healthier weight. [name] Nguyen is interested in becoming our patient and working on intensive lifestyle modifications including (but not limited to) diet and exercise for weight loss.  [name] Nguyen states that she is lactose intolerant.  She states that she does to like to cook anymore.  [name] Nguyen's habits were reviewed today and are as follows: Her family eats meals together, she thinks her family will eat healthier with her, her desired weight loss is 51 pounds, she started gaining weight after having a hysterectomy, her heaviest weight ever was 215 pounds, she is a picky eater and doesn't like to eat healthier foods, she snacks frequently in the evenings, she skips breakfast or dinner frequently, she frequently makes poor food choices, and she struggles with emotional eating.  Depression Screen [name] Nguyen's Food and Mood (modified PHQ-9) score was 8.  Depression screen [name] Nguyen 2/9 06/18/2021  Decreased Interest 0  Down, Depressed, Hopeless 1  PHQ - 2 Score 1  Altered sleeping 2  Tired, decreased energy 2  Change in appetite 2  Feeling bad or failure about yourself  0  Trouble concentrating 1  Moving slowly or fidgety/restless 0  Suicidal thoughts 0  PHQ-9 Score 8  Difficult doing work/chores Not difficult at all   Subjective:   1. Other fatigue [name] Nguyen denies daytime  somnolence and denies waking up still tired. [name] has a history of symptoms of snoring. [name] Nguyen generally gets 4 or 5 hours of sleep per night, and states that she has poor sleep quality. Snoring is not present. Apneic episodes are not present. Epworth Sleepiness Score is 9.  Occurs with certain activities.  2. SOB (shortness of breath) on exertion [name] Nguyen notes increasing shortness of breath with exercising and seems to be worsening over time with weight gain. She notes getting out of breath sooner with activity than she used to. This has gotten worse recently. [name] Nguyen denies shortness of breath at rest or orthopnea.  Occurs with certain activities.  3. Essential hypertension Typically well controlled.  Taking HCTZ, metoprolol, losartan.  Review: taking medications as instructed, no medication side effects noted, no chest pain on exertion, no dyspnea on exertion, no swelling of ankles.   BP Readings from Last 3 Encounters:  06/18/21 (!) 148/72  06/11/21 (!) 141/72  03/25/21 126/62   4. Rheumatoid arthritis, involving unspecified site, unspecified whether rheumatoid factor present ([name] Nguyen) Uses ibuprofen for pain.  5. Hiatal hernia She is taking Prilosec.  6. Vitamin D deficiency She is currently taking OTC vitamin D 1000 each day. She denies nausea, vomiting or muscle weakness.  Lab Results  Component Value Date   VD25OH 27 (L) 12/05/2008   VD25OH 33 04/24/2008   7. Prediabetes [name] has a diagnosis of prediabetes based on her elevated HgA1c and was informed this puts her at greater risk of developing diabetes. She continues to work on diet and exercise to decrease her  risk of diabetes. She denies nausea or hypoglycemia.  A1c 5.7.  Lab Results  Component Value Date   HGBA1C 5.7 12/30/2020   8. Depression screen [name] was screened for depression as part of her new patient workup today.  PHQ-9 is 8.  Assessment/Plan:   1. Other fatigue [name] Nguyen does feel that her weight is  causing her energy to be lower than it should be. Fatigue may be related to [name], depression or many other causes. Labs will be ordered, and in the meanwhile, [name] Nguyen will focus on self care including making healthy food choices, increasing physical activity and focusing on stress reduction.  Gradually increase activities/exercise.  Will check thyroid panel today.  - EKG 12-Lead - T3 - T4, free - TSH  2. SOB (shortness of breath) on exertion [name] does feel that she gets out of breath more easily that she used to when she exercises. [name] Nguyen's shortness of breath appears to be [name] related and exercise induced. She has agreed to work on weight loss and gradually increase exercise to treat her exercise induced shortness of breath. Will continue to monitor closely.  Gradually increase activities/exercise.  Will check thyroid panel today.  - Lipid Panel With LDL/HDL Ratio - T3 - T4, free - TSH  3. Essential hypertension Continue medications.  Check lipid panel today.  [name] Nguyen is working on healthy weight loss and exercise to improve blood pressure control. We will watch for signs of hypotension as she continues her lifestyle modifications.  4. Rheumatoid arthritis, involving unspecified site, unspecified whether rheumatoid factor present ([name] Nguyen) [name] Nguyen will continue to follow with Rheumatology.  5. Hiatal hernia Continue Prilosec.  6. Vitamin D deficiency Low Vitamin D level contributes to fatigue and are associated with [name], breast, and colon cancer. She agrees to continue to take OTC vitamin D.  Will check her vitamin D level today, as per below.  - VITAMIN D 25 Hydroxy (Vit-D Deficiency, Fractures)  7. Prediabetes Will check A1c and insulin level today.  [name] Nguyen will continue to work on weight loss, exercise, and decreasing simple carbohydrates to help decrease the risk of diabetes.   - Hemoglobin A1c - Insulin, random  8. Depression screen [name] Nguyen had a positive  depression screening. Depression is commonly associated with [name] and often results in emotional eating behaviors. We will monitor this closely and work on CBT to help improve the non-hunger eating patterns. Referral to Psychology may be required if no improvement is seen as she continues in our clinic.  9. Class 1 [name] with serious comorbidity and body mass index (BMI) of 31.0 to 31.9 in adult, unspecified [name] type  [name] Nguyen is currently in the action stage of change and her goal is to continue with weight loss efforts. I recommend [name] Nguyen begin the structured treatment plan as follows:  She has agreed to the Category 1 Plan.  She will work on meal planning, mindful eating, and stopping all sugary drinks.  We reviewed labs from 06/11/2021, including iron/anemia panel, CMP, CBC, and glucose.  Exercise goals: No exercise has been prescribed at this time.   Behavioral modification strategies: increasing lean protein intake, decreasing simple carbohydrates, increasing vegetables, increasing water intake, decreasing eating out, no skipping meals, meal planning and cooking strategies, keeping healthy foods in the home, and planning for success.  She was informed of the importance of frequent follow-up visits to maximize her success with intensive lifestyle modifications for her multiple health conditions. She was informed we would discuss her lab results at her next  visit unless there is a critical issue that needs to be addressed sooner. [name] Nguyen agreed to keep her next visit at the agreed upon time to discuss these results.  Objective:   Blood pressure (!) 148/72, pulse 63, temperature 98.2 F (36.8 C), height 5\' 7"  (1.702 m), weight 199 lb (90.3 kg), SpO2 99 %. Body mass index is 31.17 kg/m.  EKG: Normal sinus rhythm, rate 65 bpm.  Indirect Calorimeter completed today shows a VO2 of 206 and a REE of 1426.  Her calculated basal metabolic rate is 4782 thus her basal metabolic rate is worse  than expected.  General: Cooperative, alert, well developed, in no acute distress. HEENT: Conjunctivae and lids unremarkable. Cardiovascular: Regular rhythm.  Lungs: Normal work of breathing. Neurologic: No focal deficits.   Lab Results  Component Value Date   CREATININE 0.87 06/11/2021   BUN 11 06/11/2021   NA 140 06/11/2021   K 3.6 06/11/2021   CL 107 06/11/2021   CO2 24 06/11/2021   Lab Results  Component Value Date   ALT 23 06/11/2021   AST 28 06/11/2021   ALKPHOS 104 06/11/2021   BILITOT 0.4 06/11/2021   Lab Results  Component Value Date   HGBA1C 5.7 12/30/2020   HGBA1C 5.6 04/29/2020   HGBA1C 5.5 10/05/2014   Lab Results  Component Value Date   TSH 1.14 12/30/2020   Lab Results  Component Value Date   CHOL 159 12/30/2020   HDL 61.30 12/30/2020   LDLCALC 76 12/30/2020   TRIG 109.0 12/30/2020   CHOLHDL 3 12/30/2020   Lab Results  Component Value Date   WBC 4.9 06/11/2021   HGB 12.0 06/11/2021   HCT 37.0 06/11/2021   MCV 94.1 06/11/2021   PLT 197 06/11/2021   Lab Results  Component Value Date   IRON 156 (H) 06/11/2021   TIBC 389 06/11/2021   FERRITIN 18 06/11/2021   [name] Behavioral Intervention:   Approximately 15 minutes were spent on the discussion below.  ASK: We discussed the diagnosis of [name] with [name] Nguyen today and [name] Nguyen agreed to give [name] permission to discuss [name] behavioral modification therapy today.  ASSESS: [name] Nguyen has the diagnosis of [name] and her BMI today is 31.2. [name] Nguyen is in the action stage of change.   ADVISE: Chanice was educated on the multiple health risks of [name] as well as the benefit of weight loss to improve her health. She was advised of the need for long term treatment and the importance of lifestyle modifications to improve her current health and to decrease her risk of future health problems.  AGREE: Multiple dietary modification options and treatment options were discussed and Dawnyel agreed to  follow the recommendations documented in the above note.  ARRANGE: Tuwanda was educated on the importance of frequent visits to treat [name] as outlined per CMS and USPSTF guidelines and agreed to schedule her next follow up appointment today.  Attestation Statements:   Reviewed by clinician on day of visit: allergies, medications, problem list, medical history, surgical history, family history, social history, and previous encounter notes.  I, Water quality scientist, CMA, am acting as Location manager for [employer], DO  I have reviewed the above documentation for accuracy and completeness, and I agree with the above. Jearld Lesch, DO

## 2021-06-22 ENCOUNTER — Ambulatory Visit (HOSPITAL_COMMUNITY)
Admission: RE | Admit: 2021-06-22 | Discharge: 2021-06-22 | Disposition: A | Discharge status: Home / Self Care | Payer: Medicare HMO | Source: Ambulatory Visit | Attending: Adult Health | Admitting: Adult Health

## 2021-06-22 ENCOUNTER — Other Ambulatory Visit: Payer: Self-pay

## 2021-06-22 ENCOUNTER — Encounter (HOSPITAL_COMMUNITY): Payer: Self-pay

## 2021-06-22 DIAGNOSIS — K449 Diaphragmatic hernia without obstruction or gangrene: Secondary | ICD-10-CM | POA: Diagnosis not present

## 2021-06-22 DIAGNOSIS — R310 Gross hematuria: Secondary | ICD-10-CM | POA: Diagnosis not present

## 2021-06-22 DIAGNOSIS — R319 Hematuria, unspecified: Secondary | ICD-10-CM | POA: Diagnosis not present

## 2021-06-22 DIAGNOSIS — K7689 Other specified diseases of liver: Secondary | ICD-10-CM | POA: Diagnosis not present

## 2021-06-22 DIAGNOSIS — I700 Atherosclerosis of aorta: Secondary | ICD-10-CM | POA: Diagnosis not present

## 2021-06-26 ENCOUNTER — Telehealth: Payer: Self-pay | Admitting: *Deleted

## 2021-06-26 ENCOUNTER — Telehealth: Payer: Self-pay | Admitting: Hematology and Oncology

## 2021-06-26 DIAGNOSIS — R310 Gross hematuria: Secondary | ICD-10-CM

## 2021-06-26 NOTE — Telephone Encounter (Signed)
Per Wilber Bihari, NP- Ct renal stone appears normal--no kidney stone.  Pt needing to recollect urinalysis and urine culture and await results.  If still blood in urine in absence of infection will consider referring to urology.  RN attempt x1 to contact pt to schedule lab appt for UA and culture.  No answer, LVM for pt to return call to the office.

## 2021-06-26 NOTE — Telephone Encounter (Signed)
Sch per 9/23 los, left msg

## 2021-06-29 DIAGNOSIS — R0602 Shortness of breath: Secondary | ICD-10-CM | POA: Diagnosis not present

## 2021-06-29 DIAGNOSIS — E559 Vitamin D deficiency, unspecified: Secondary | ICD-10-CM | POA: Diagnosis not present

## 2021-06-29 DIAGNOSIS — R5383 Other fatigue: Secondary | ICD-10-CM | POA: Diagnosis not present

## 2021-06-29 DIAGNOSIS — R7303 Prediabetes: Secondary | ICD-10-CM | POA: Diagnosis not present

## 2021-06-29 DIAGNOSIS — E785 Hyperlipidemia, unspecified: Secondary | ICD-10-CM | POA: Diagnosis not present

## 2021-06-30 ENCOUNTER — Encounter (INDEPENDENT_AMBULATORY_CARE_PROVIDER_SITE_OTHER): Payer: Self-pay | Admitting: Bariatrics

## 2021-06-30 ENCOUNTER — Emergency Department (HOSPITAL_COMMUNITY)
Admission: EM | Admit: 2021-06-30 | Discharge: 2021-07-01 | Disposition: A | Discharge status: Home / Self Care | Payer: Medicare HMO | Attending: Emergency Medicine | Admitting: Emergency Medicine

## 2021-06-30 ENCOUNTER — Other Ambulatory Visit: Payer: Self-pay

## 2021-06-30 ENCOUNTER — Emergency Department (HOSPITAL_COMMUNITY): Payer: Medicare HMO

## 2021-06-30 ENCOUNTER — Encounter (HOSPITAL_COMMUNITY): Payer: Self-pay | Admitting: Emergency Medicine

## 2021-06-30 DIAGNOSIS — M791 Myalgia, unspecified site: Secondary | ICD-10-CM | POA: Insufficient documentation

## 2021-06-30 DIAGNOSIS — R7303 Prediabetes: Secondary | ICD-10-CM | POA: Insufficient documentation

## 2021-06-30 DIAGNOSIS — K219 Gastro-esophageal reflux disease without esophagitis: Secondary | ICD-10-CM | POA: Insufficient documentation

## 2021-06-30 DIAGNOSIS — Z79899 Other long term (current) drug therapy: Secondary | ICD-10-CM | POA: Diagnosis not present

## 2021-06-30 DIAGNOSIS — R918 Other nonspecific abnormal finding of lung field: Secondary | ICD-10-CM | POA: Diagnosis not present

## 2021-06-30 DIAGNOSIS — R0789 Other chest pain: Secondary | ICD-10-CM | POA: Diagnosis not present

## 2021-06-30 DIAGNOSIS — Z85038 Personal history of other malignant neoplasm of large intestine: Secondary | ICD-10-CM | POA: Insufficient documentation

## 2021-06-30 DIAGNOSIS — K449 Diaphragmatic hernia without obstruction or gangrene: Secondary | ICD-10-CM | POA: Diagnosis not present

## 2021-06-30 DIAGNOSIS — I10 Essential (primary) hypertension: Secondary | ICD-10-CM | POA: Insufficient documentation

## 2021-06-30 DIAGNOSIS — I700 Atherosclerosis of aorta: Secondary | ICD-10-CM | POA: Diagnosis not present

## 2021-06-30 DIAGNOSIS — Y92410 Unspecified street and highway as the place of occurrence of the external cause: Secondary | ICD-10-CM | POA: Diagnosis not present

## 2021-06-30 DIAGNOSIS — Z96652 Presence of left artificial knee joint: Secondary | ICD-10-CM | POA: Diagnosis not present

## 2021-06-30 DIAGNOSIS — R109 Unspecified abdominal pain: Secondary | ICD-10-CM | POA: Diagnosis not present

## 2021-06-30 DIAGNOSIS — M545 Low back pain, unspecified: Secondary | ICD-10-CM | POA: Diagnosis not present

## 2021-06-30 DIAGNOSIS — M542 Cervicalgia: Secondary | ICD-10-CM | POA: Diagnosis not present

## 2021-06-30 DIAGNOSIS — K5730 Diverticulosis of large intestine without perforation or abscess without bleeding: Secondary | ICD-10-CM | POA: Diagnosis not present

## 2021-06-30 DIAGNOSIS — M7918 Myalgia, other site: Secondary | ICD-10-CM

## 2021-06-30 DIAGNOSIS — Z853 Personal history of malignant neoplasm of breast: Secondary | ICD-10-CM | POA: Insufficient documentation

## 2021-06-30 DIAGNOSIS — Z91040 Latex allergy status: Secondary | ICD-10-CM | POA: Diagnosis not present

## 2021-06-30 DIAGNOSIS — M549 Dorsalgia, unspecified: Secondary | ICD-10-CM | POA: Diagnosis not present

## 2021-06-30 DIAGNOSIS — J929 Pleural plaque without asbestos: Secondary | ICD-10-CM | POA: Diagnosis not present

## 2021-06-30 LAB — LIPID PANEL WITH LDL/HDL RATIO
Cholesterol, Total: 153 mg/dL (ref 100–199)
HDL: 57 mg/dL (ref >39)
LDL Chol Calc (NIH): 80 mg/dL (ref 0–99)
LDL/HDL Ratio: 1.4 ratio (ref 0.0–3.2)
Triglycerides: 83 mg/dL (ref 0–149)
VLDL Cholesterol Cal: 16 mg/dL (ref 5–40)

## 2021-06-30 LAB — T4, FREE: Free T4: 1.35 ng/dL (ref 0.82–1.77)

## 2021-06-30 LAB — INSULIN, RANDOM: INSULIN: 16.3 u[IU]/mL (ref 2.6–24.9)

## 2021-06-30 LAB — HEMOGLOBIN A1C
Est. average glucose Bld gHb Est-mCnc: 117 mg/dL
Hgb A1c MFr Bld: 5.7 % — ABNORMAL HIGH (ref 4.8–5.6)

## 2021-06-30 LAB — T3: T3, Total: 112 ng/dL (ref 71–180)

## 2021-06-30 LAB — TSH: TSH: 1.61 u[IU]/mL (ref 0.450–4.500)

## 2021-06-30 LAB — LACTIC ACID, PLASMA: Lactic Acid, Venous: 1.7 mmol/L (ref 0.5–1.9)

## 2021-06-30 LAB — VITAMIN D 25 HYDROXY (VIT D DEFICIENCY, FRACTURES): Vit D, 25-Hydroxy: 45.6 ng/mL (ref 30.0–100.0)
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
## 2021-06-30 NOTE — ED Notes (Signed)
Per pt request, d/t previous lab issues remaining labs to be drawn once room assigned. RN advised. [employer]

## 2021-06-30 NOTE — ED Notes (Signed)
Patient states she can not have contrast. Pa notified.

## 2021-07-01 ENCOUNTER — Emergency Department (HOSPITAL_COMMUNITY): Payer: Medicare HMO

## 2021-07-01 DIAGNOSIS — K449 Diaphragmatic hernia without obstruction or gangrene: Secondary | ICD-10-CM | POA: Diagnosis not present

## 2021-07-01 DIAGNOSIS — M542 Cervicalgia: Secondary | ICD-10-CM | POA: Diagnosis not present

## 2021-07-01 DIAGNOSIS — I700 Atherosclerosis of aorta: Secondary | ICD-10-CM | POA: Diagnosis not present

## 2021-07-01 DIAGNOSIS — M545 Low back pain, unspecified: Secondary | ICD-10-CM | POA: Diagnosis not present

## 2021-07-01 DIAGNOSIS — M549 Dorsalgia, unspecified: Secondary | ICD-10-CM | POA: Diagnosis not present

## 2021-07-01 DIAGNOSIS — K5730 Diverticulosis of large intestine without perforation or abscess without bleeding: Secondary | ICD-10-CM | POA: Diagnosis not present

## 2021-07-01 LAB — URINALYSIS, ROUTINE W REFLEX MICROSCOPIC
Bilirubin Urine: NEGATIVE
Glucose, UA: NEGATIVE mg/dL
Hgb urine dipstick: NEGATIVE
Ketones, ur: NEGATIVE mg/dL
Nitrite: NEGATIVE
Protein, ur: NEGATIVE mg/dL
Specific Gravity, Urine: 1.012 (ref 1.005–1.030)
pH: 7 (ref 5.0–8.0)

## 2021-07-01 MED ORDER — HYDROCODONE-ACETAMINOPHEN 5-325 MG PO TABS: 1.0000 | ORAL_TABLET | ORAL | 0 refills | Status: DC | PRN

## 2021-07-01 MED ORDER — METHOCARBAMOL 500 MG PO TABS: 500.0000 mg | ORAL_TABLET | Freq: Two times a day (BID) | ORAL | 0 refills | Status: DC

## 2021-07-01 MED ORDER — IBUPROFEN 200 MG PO TABS
600.0000 mg | ORAL_TABLET | Freq: Once | ORAL | Status: AC
  Administered 2021-07-01: 600 mg via ORAL
  Filled 2021-07-01: qty 3
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
## 2021-07-02 ENCOUNTER — Other Ambulatory Visit: Payer: Self-pay

## 2021-07-02 ENCOUNTER — Ambulatory Visit (INDEPENDENT_AMBULATORY_CARE_PROVIDER_SITE_OTHER): Payer: Medicare HMO | Admitting: Bariatrics

## 2021-07-02 ENCOUNTER — Encounter (INDEPENDENT_AMBULATORY_CARE_PROVIDER_SITE_OTHER): Payer: Self-pay | Admitting: Bariatrics

## 2021-07-02 VITALS — BP 114/72 | HR 69 | Temp 98.4°F | Ht 67.0 in | Wt 199.0 lb

## 2021-07-02 DIAGNOSIS — E669 Obesity, unspecified: Secondary | ICD-10-CM | POA: Diagnosis not present

## 2021-07-02 DIAGNOSIS — I10 Essential (primary) hypertension: Secondary | ICD-10-CM | POA: Diagnosis not present

## 2021-07-02 DIAGNOSIS — Z6831 Body mass index (BMI) 31.0-31.9, adult: Secondary | ICD-10-CM

## 2021-07-02 DIAGNOSIS — R7303 Prediabetes: Secondary | ICD-10-CM | POA: Diagnosis not present

## 2021-07-03 ENCOUNTER — Inpatient Hospital Stay: Payer: Medicare HMO

## 2021-07-03 ENCOUNTER — Telehealth: Payer: Self-pay | Admitting: *Deleted

## 2021-07-03 ENCOUNTER — Telehealth: Payer: Self-pay | Admitting: Hematology and Oncology

## 2021-07-03 NOTE — Telephone Encounter (Signed)
Pt called in to cancel labs. Declined to r/s at this time, said she was waiting for the nurse to call her back.

## 2021-07-03 NOTE — Telephone Encounter (Signed)
Called pt to inform her that u/a done in ED was better per Mendel Ryder NP.  She reports still having back pain which is where her pain starts & moves to both ribcage & abdomen. She reports pain is more intense at end of day.  ED gave her muscle relaxer. She just recently saw Dr Jana Hakim & scans have been OK so far.  Encouraged to drink plenty of fluids, try antiinflammatory, heat, muscle relaxer & see if this improves.  If not may need to see PCP or rheumatologist. She also mentioned having some constipation recently & asked if this could be causing some of this.  Informed possibly & suggest stool softener/miralax, fluids, fiber foods.

## 2021-07-06 NOTE — Progress Notes (Signed)
Chief Complaint:   OBESITY [name] is here to discuss her progress with her obesity treatment plan along with follow-up of her obesity related diagnoses. [name] is on the Category 1 Plan and states she is following her eating plan approximately 50% of the time. [name] states she is walking for 15 minutes 4 times per week.  Today's visit was #: 3 Starting weight: 199 lbs Starting date: 06/18/2021 Today's weight: 196 lbs Today's date: 07/02/2021 Total lbs lost to date: 3 lbs Total lbs lost since last in-office visit: 3 lbs  Interim History: [name] is down 3 lbs since her first week. She notes that she has had some sugar cravings.   Subjective:   1. Prediabetes [name] is not on medication currently.   2. Essential hypertension [name]'s blood pressure is controlled. She is currently taking Metoprolol Succinate.  Assessment/Plan:   1. Prediabetes [name] will continue to work on weight loss, exercise, and decreasing simple carbohydrates to help decrease the risk of diabetes. She will increase healthy fats and protein. Handout for Insulin resistance and pre-diabetes were given today.  2. Essential hypertension [name] is working on healthy weight loss and exercise to improve blood pressure control. She will continue her medications. She will continue with no salt added. We will watch for signs of hypotension as she continues her lifestyle modifications.   3. Obesity with current BMI of 30.8 [name] is currently in the action stage of change. As such, her goal is to continue with weight loss efforts. She has agreed to the Category 1 Plan.   [name] will continue meal planning. We reviewed labs from 06/18/2021. She will weigh her protein.  Exercise goals:  As is.  Behavioral modification strategies: increasing lean protein intake, decreasing simple carbohydrates, increasing vegetables, increasing water intake, decreasing eating out, no skipping meals, meal planning and  cooking strategies, keeping healthy foods in the home, better snacking choices, emotional eating strategies, and planning for success.  [name] has agreed to follow-up with our clinic in 2 weeks. She was informed of the importance of frequent follow-up visits to maximize her success with intensive lifestyle modifications for her multiple health conditions.   Objective:   Blood pressure 114/72, pulse 69, temperature 98.4 F (36.9 C), height 5\' 7"  (1.702 m), weight 199 lb (90.3 kg), SpO2 95 %. Body mass index is 31.17 kg/m.  General: Cooperative, alert, well developed, in no acute distress. HEENT: Conjunctivae and lids unremarkable. Cardiovascular: Regular rhythm.  Lungs: Normal work of breathing. Neurologic: No focal deficits.   Lab Results  Component Value Date   CREATININE 0.87 06/11/2021   BUN 11 06/11/2021   NA 140 06/11/2021   K 3.6 06/11/2021   CL 107 06/11/2021   CO2 24 06/11/2021   Lab Results  Component Value Date   ALT 23 06/11/2021   AST 28 06/11/2021   ALKPHOS 104 06/11/2021   BILITOT 0.4 06/11/2021   Lab Results  Component Value Date   HGBA1C 5.7 (H) 06/29/2021   HGBA1C 5.7 12/30/2020   HGBA1C 5.6 04/29/2020   HGBA1C 5.5 10/05/2014   Lab Results  Component Value Date   INSULIN 16.3 06/29/2021   Lab Results  Component Value Date   TSH 1.610 06/29/2021   Lab Results  Component Value Date   CHOL 153 06/29/2021   HDL 57 06/29/2021   LDLCALC 80 06/29/2021   TRIG 83 06/29/2021   CHOLHDL 3 12/30/2020   Lab Results  Component Value Date   VD25OH 45.6 06/29/2021  VD25OH 27 (L) 12/05/2008   VD25OH 33 04/24/2008   Lab Results  Component Value Date   WBC 4.9 06/11/2021   HGB 12.0 06/11/2021   HCT 37.0 06/11/2021   MCV 94.1 06/11/2021   PLT 197 06/11/2021   Lab Results  Component Value Date   IRON 156 (H) 06/11/2021   TIBC 389 06/11/2021   FERRITIN 18 06/11/2021    Obesity Behavioral Intervention:   Approximately 15 minutes were spent on  the discussion below.  ASK: We discussed the diagnosis of obesity with [place] today and [name] agreed to give [name] permission to discuss obesity behavioral modification therapy today.  ASSESS: [name] has the diagnosis of obesity and her BMI today is 30.8. [name] is in the action stage of change.   ADVISE: [name] was educated on the multiple health risks of obesity as well as the benefit of weight loss to improve her health. She was advised of the need for long term treatment and the importance of lifestyle modifications to improve her current health and to decrease her risk of future health problems.  AGREE: Multiple dietary modification options and treatment options were discussed and [name] agreed to follow the recommendations documented in the above note.  ARRANGE: [name] was educated on the importance of frequent visits to treat obesity as outlined per CMS and USPSTF guidelines and agreed to schedule her next follow up appointment today.  Attestation Statements:   Reviewed by clinician on day of visit: allergies, medications, problem list, medical history, surgical history, family history, social history, and previous encounter notes.   I, Lizbeth Bark, RMA, am acting as Location manager for [employer], DO.   I have reviewed the above documentation for accuracy and completeness, and I agree with the above. Jearld Lesch, DO

## 2021-07-16 ENCOUNTER — Encounter (HOSPITAL_COMMUNITY): Payer: Self-pay

## 2021-07-16 ENCOUNTER — Encounter (HOSPITAL_BASED_OUTPATIENT_CLINIC_OR_DEPARTMENT_OTHER): Payer: Self-pay | Admitting: Emergency Medicine

## 2021-07-16 ENCOUNTER — Emergency Department (HOSPITAL_BASED_OUTPATIENT_CLINIC_OR_DEPARTMENT_OTHER): Payer: Medicare HMO

## 2021-07-16 ENCOUNTER — Other Ambulatory Visit: Payer: Self-pay

## 2021-07-16 ENCOUNTER — Ambulatory Visit (HOSPITAL_COMMUNITY)
Admission: EM | Admit: 2021-07-16 | Discharge: 2021-07-16 | Disposition: A | Discharge status: Home / Self Care | Payer: Medicare HMO | Attending: Family Medicine | Admitting: Family Medicine

## 2021-07-16 DIAGNOSIS — K219 Gastro-esophageal reflux disease without esophagitis: Secondary | ICD-10-CM | POA: Insufficient documentation

## 2021-07-16 DIAGNOSIS — R1013 Epigastric pain: Secondary | ICD-10-CM | POA: Diagnosis not present

## 2021-07-16 DIAGNOSIS — Z853 Personal history of malignant neoplasm of breast: Secondary | ICD-10-CM | POA: Diagnosis not present

## 2021-07-16 DIAGNOSIS — K5790 Diverticulosis of intestine, part unspecified, without perforation or abscess without bleeding: Secondary | ICD-10-CM | POA: Diagnosis not present

## 2021-07-16 DIAGNOSIS — Z79899 Other long term (current) drug therapy: Secondary | ICD-10-CM | POA: Insufficient documentation

## 2021-07-16 DIAGNOSIS — Z91040 Latex allergy status: Secondary | ICD-10-CM | POA: Diagnosis not present

## 2021-07-16 DIAGNOSIS — R109 Unspecified abdominal pain: Secondary | ICD-10-CM | POA: Diagnosis not present

## 2021-07-16 DIAGNOSIS — I10 Essential (primary) hypertension: Secondary | ICD-10-CM | POA: Diagnosis not present

## 2021-07-16 DIAGNOSIS — Z96653 Presence of artificial knee joint, bilateral: Secondary | ICD-10-CM | POA: Insufficient documentation

## 2021-07-16 LAB — LIPASE, BLOOD: Lipase: 16 U/L (ref 11–51)

## 2021-07-16 LAB — COMPREHENSIVE METABOLIC PANEL
ALT: 19 U/L (ref 0–44)
AST: 24 U/L (ref 15–41)
Albumin: 4.1 g/dL (ref 3.5–5.0)
Alkaline Phosphatase: 79 U/L (ref 38–126)
Anion gap: 10 (ref 5–15)
BUN: 13 mg/dL (ref 8–23)
CO2: 27 mmol/L (ref 22–32)
Calcium: 9.7 mg/dL (ref 8.9–10.3)
Chloride: 100 mmol/L (ref 98–111)
Creatinine, Ser: 0.69 mg/dL (ref 0.44–1.00)
GFR, Estimated: >60 mL/min (ref >60)
Glucose, Bld: 87 mg/dL (ref 70–99)
Potassium: 3.4 mmol/L — ABNORMAL LOW (ref 3.5–5.1)
Sodium: 137 mmol/L (ref 135–145)
Total Bilirubin: 0.3 mg/dL (ref 0.3–1.2)
Total Protein: 7.3 g/dL (ref 6.5–8.1)

## 2021-07-16 LAB — CBC
HCT: 37.1 % (ref 36.0–46.0)
Hemoglobin: 12.3 g/dL (ref 12.0–15.0)
MCH: 30.8 pg (ref 26.0–34.0)
MCHC: 33.2 g/dL (ref 30.0–36.0)
MCV: 92.8 fL (ref 80.0–100.0)
Platelets: 225 10*3/uL (ref 150–400)
RBC: 4 MIL/uL (ref 3.87–5.11)
RDW: 12.7 % (ref 11.5–15.5)
WBC: 5.5 10*3/uL (ref 4.0–10.5)
nRBC: 0 % (ref 0.0–0.2)

## 2021-07-16 NOTE — Discharge Instructions (Signed)
Go to the emergency room for further evaluation and management as we discussed.

## 2021-07-16 NOTE — ED Triage Notes (Signed)
Pt is c/o of abdominal pain; pt states that she believes she has a lump, maybe hernia, in the epigastric region; pt stated that she has been to the ER about this problem at the beginning of this month but nothing was found, imaging was done; however, she states that the lump was not there when she went to the ER; pt also says that due to the lump, she is having generalized abdominal pain in all quadrants

## 2021-07-16 NOTE — ED Notes (Signed)
Patient is being discharged from the Urgent Care and sent to the Emergency Department via POV. Per Junie Panning, [place], patient is in need of higher level of care due to further evaluation. Patient is aware and verbalizes understanding of plan of care.  Vitals:   07/16/21 1955  BP: (!) 153/105  Pulse: 61  Resp: 16  Temp: 98.2 F (36.8 C)  SpO2: 97%

## 2021-07-16 NOTE — ED Triage Notes (Signed)
  Patient comes in with upper abdominal pain and was referred here from [facility] urgent care for CT scan to rule out hiatal hernia.  Patient states she has had upper abdominal pain that has been going on for a week.  Patient now states there is a knot in her upper abdomen.  Endorses nausea but with no vomiting.  Pain 9/10, throbbing pain in abdomen.

## 2021-07-16 NOTE — ED Provider Notes (Signed)
[place]    CSN: [id number] Arrival date & time: 07/16/21  1921      History   Chief Complaint Chief Complaint  Patient presents with   Abdominal Pain    Feels like there is a lump in epigastric area; causing generalized pain in all quadrants    HPI [name] is a 70 y.o. female.   Patient presents today with 1 week history of worsening epigastric abdominal pain.  She reports pain is rated 8 on a 0-10 pain scale, localized epigastrium with radiation towards back and throughout abdomen, described as sharp, no aggravating relieving factors identified.  She denies any changes to bowel habits including constipation, melena, hematochezia.  She does report occasional nausea but denies any vomiting.  She does have a history of breast cancer as well as colon cancer but has had no evidence of recurrence.  She has recently developed a lump in the epigastrium that has enlarged recently.  She had CT scan on 07/01/2021 at the emergency room that was normal but reports that she was not experiencing severe epigastric abdominal pain and mass was not present at that time.  She has had several abdominal surgeries including open cholecystectomy, appendectomy, colonoscopy, tubal ligation, partial hysterectomy.  She has tried ibuprofen as well as muscle relaxers without improvement of symptoms.  Reports pain is gradually been worsening prompting evaluation today.  She denies any recent medication changes.  Denies history of pancreatitis.  Does not take GLP-1 agonist.  Does not drink alcohol.   Past Medical History:  Diagnosis Date   Arthritis    R knee, hands    Back pain    Blood transfusion    1986- post-partial hysterectomy   Breast cancer ([place]) 2009   right lumpectomy and radiation   Chicken pox    Colon cancer ([place]) 2003   per pt report, cancer found in a colon polyp in 2003, procedure done in [place].  did not require surgery, chemo etc.     Depression    Diverticulitis     Edema of both ankles    Family history of breast cancer 10/24/2017   Gallbladder problem    Gastritis    GERD (gastroesophageal reflux disease)    recently taken off anti reflux tx   Heart murmur    functional murmur, echo in [place]- long time ago    Hepatitis    Hep. 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   Class 1 obesity due to excess calories with serious comorbidity and body mass index (BMI) of 32.0 to 32.9 in adult 02/17/2021   Psychophysiological insomnia 02/17/2021   Hyperlipidemia with target LDL less than 130 12/30/2020   Encounter for general adult medical examination with abnormal findings 12/30/2020   LVH (left ventricular hypertrophy) due to hypertensive disease, without heart failure 12/30/2020   History of breast cancer 08/07/2018   Family history of coronary artery disease  in father 08/07/2018   Gastritis and gastroduodenitis    Genetic testing 11/24/2017   Family history of breast cancer 10/24/2017   Personal history of colon cancer 10/24/2017   Osteoarthritis of right knee 08/29/2011   Obesity (BMI 30.0-34.9) 12/24/2009   NEUROPATHY 09/04/2009   ARTHRALGIA 04/22/2009   Malignant neoplasm of upper-outer quadrant of right breast in female, estrogen receptor positive ([place]) 01/11/2008   Hiatal hernia 12/14/2007   DIVERTICULOSIS, COLON 12/14/2007   MAMMOGRAM, ABNORMAL, RIGHT 12/14/2007   RHEUMATIC FEVER 11/03/2007   Essential hypertension 11/03/2007   Lupus ([place]) 11/03/2007    Past Surgical History:   Procedure Laterality Date   [place] & tubal ligation    BIOPSY  07/25/2018   Procedure: BIOPSY;  Surgeon: Milus Banister, MD;  Location: [facility] ENDOSCOPY;  Service: Endoscopy;;   BREAST EXCISIONAL BIOPSY Left 1998   BREAST LUMPECTOMY Right    2009   BREAST SURGERY     R breast lumpectomy   CHOLECYSTECTOMY     1978- open    ESOPHAGOGASTRODUODENOSCOPY (EGD) WITH PROPOFOL N/A 07/25/2018   Procedure: ESOPHAGOGASTRODUODENOSCOPY (EGD) WITH PROPOFOL;  Surgeon: Milus Banister, MD;  Location: [facility] ENDOSCOPY;  Service: Endoscopy;  Laterality: N/A;   REPLACEMENT TOTAL KNEE Left    TONSILLECTOMY     1954   TOTAL KNEE ARTHROPLASTY  08/30/2011   Procedure: TOTAL KNEE ARTHROPLASTY;  Surgeon: Kerin Salen;  Location: [place];  Service: Orthopedics;  Laterality: Right;  Right Total Knee Arthroplasty   TUBAL LIGATION      OB History     Gravida  6   Para  6   Term  0   Preterm  0   AB  0   Living         SAB  0   IAB  0   Ectopic  0   Multiple      Live Births               Home Medications    Prior to Admission medications   Medication Sig Start Date End Date Taking? Authorizing Provider  cholecalciferol (VITAMIN D) 1000 units tablet Take 1,000 Units by mouth daily.    [provider]  hydrochlorothiazide (HYDRODIURIL) 25 MG tablet Take 25 mg by mouth daily.    [provider]  HYDROcodone-acetaminophen (NORCO/VICODIN) 5-325 MG tablet Take 1-2 tablets by mouth every 4 (four) hours as needed. 07/01/21   Charlann Lange, PA-C  ibuprofen (ADVIL) 800 MG tablet Take 800 mg by mouth every 8 (eight) hours as needed.    [provider]  losartan (COZAAR) 25 MG tablet Take 1 tablet (25 mg total) by mouth every other day. 09/29/20   Erlene Quan, PA-C  methocarbamol (ROBAXIN) 500 MG tablet Take 1 tablet (500 mg total) by mouth 2 (two) times daily. 07/01/21   Charlann Lange, PA-C  metoprolol succinate  (TOPROL-XL) 25 MG 24 hr tablet Take 1 tablet (25 mg total) by mouth daily. 12/30/20   Janith Lima, MD  Multiple Vitamins-Minerals (EMERGEN-C IMMUNE PLUS PO) Take 1 tablet by mouth daily.    [provider]  omeprazole (PRILOSEC) 40 MG capsule Take 1 capsule (40 mg total) by mouth daily. 02/17/18   Magrinat, Virgie Dad, MD  potassium chloride (KLOR-CON) 10 MEQ tablet Take 1 tablet (10 mEq total) by mouth daily. 08/21/19   Magrinat, Virgie Dad, MD  tiZANidine (ZANAFLEX) 2  MG tablet as needed.    [provider]    Family History Family History  Problem Relation Age of Onset   Breast cancer Mother 72   Hypertension Mother    Stroke Mother    Depression Mother    Heart attack Father    Other Father    Heart disease Father    Sudden death Father    Hypertension Sister    Hypertension Brother    Kidney disease Brother    Other Maternal Grandmother 36       brain tumor dx at 48- did not bx or do additional testing on tumor   Arthritis Maternal Grandmother    Cancer Paternal Grandmother 68       type unk   Breast cancer Maternal Aunt 38   Cancer Paternal Aunt 65       type unk   Cancer Other    Hypertension Other    Anesthesia problems Neg Hx    Hypotension Neg Hx    Malignant hyperthermia Neg Hx    Pseudochol deficiency Neg Hx    Colon cancer Neg Hx    Esophageal cancer Neg Hx    Stomach cancer Neg Hx    Pancreatic cancer Neg Hx    Liver disease Neg Hx     Social History Social History   Tobacco Use   Smoking status: Never   Smokeless tobacco: Never  Vaping Use   Vaping Use: Never used  Substance Use Topics   Alcohol use: No   Drug use: No     Allergies   Iodine, Iohexol, Latex, Penicillins, Tetracycline, Other, Penicillin g, and Sulfa antibiotics   Review of Systems Review of Systems  Constitutional:  Positive for activity change. Negative for appetite change, fatigue and fever.  Respiratory:  Negative for cough and shortness of breath.    Cardiovascular:  Negative for chest pain.  Gastrointestinal:  Positive for abdominal pain and nausea. Negative for diarrhea and vomiting.  Neurological:  Negative for dizziness, light-headedness and headaches.    Physical Exam Triage Vital Signs ED Triage Vitals [07/16/21 1955]  Enc Vitals Group     BP (!) 153/105     Pulse Rate 61     Resp 16     Temp 98.2 F (36.8 C)     Temp Source Oral     SpO2 97 %     Weight      Height      Head Circumference      Peak Flow      Pain Score 9     Pain Loc      Pain Edu?      Excl. in [place]?    No data found.  Updated Vital Signs BP (!) 153/105 (BP Location: Left Arm)   Pulse 61   Temp 98.2 F (36.8 C) (Oral)   Resp 16   SpO2 97%   Visual Acuity Right Eye Distance:   Left Eye Distance:   Bilateral Distance:    Right Eye Near:   Left Eye Near:    Bilateral Near:     Physical Exam Vitals reviewed.  Constitutional:      General: She is awake. She is not in acute distress.    Appearance: Normal appearance. She is well-developed. She is not ill-appearing.     Comments: Very pleasant female appears stated age in obvious discomfort sitting on exam table  HENT:     Head: Normocephalic and atraumatic.  Cardiovascular:  Rate and Rhythm: Normal rate and regular rhythm.     Heart sounds: Normal heart sounds, S1 normal and S2 normal. No murmur heard. Pulmonary:     Effort: Pulmonary effort is normal.     Breath sounds: Normal breath sounds. No wheezing, rhonchi or rales.     Comments: Clear to auscultation bilaterally Abdominal:     General: A surgical scar is present. Bowel sounds are normal.     Palpations: Abdomen is soft. There is mass.     Tenderness: There is abdominal tenderness in the right upper quadrant, epigastric area and left upper quadrant. There is guarding.       Comments: Significant tenderness palpation throughout abdomen but worse in epigastrium with associated guarding.  Psychiatric:        Behavior:  Behavior is cooperative.     UC Treatments / Results  Labs (all labs ordered are listed, but only abnormal results are displayed) Labs Reviewed - No data to display  EKG   Radiology No results found.  Procedures Procedures (including critical care time)  Medications Ordered in UC Medications - No data to display  Initial Impression / Assessment and Plan / UC Course  I have reviewed the triage vital signs and the nursing notes.  Pertinent labs & imaging results that were available during my care of the patient were reviewed by me and considered in my medical decision making (see chart for details).     Concern for pancreatitis versus incarcerated hernia given clinical presentation.  Discussed that we do not have the ability of evaluating her in urgent care she needs a CT scan as well stat labs given the severity of pain as well as guarding on exam.  Patient is agreeable to going to the emergency room.  Discussed that she should go to [facility] emergency room but patient was concerned about extensive weight so she will go to [facility] for further evaluation.  Discussed that if her findings were abnormal and she needed to be hospitalized she would be taken by ambulance to [facility] which would cost her additional money to which she expressed understanding.  Vital signs were stable at the time of discharge and she will go directly to emergency room for further evaluation and management.  Final Clinical Impressions(s) / UC Diagnoses   Final diagnoses:  Epigastric pain     Discharge Instructions      Go to the emergency room for further evaluation and management as we discussed.     ED Prescriptions   None    PDMP not reviewed this encounter.   Terrilee Croak, PA-C 07/16/21 2038

## 2021-07-17 ENCOUNTER — Other Ambulatory Visit: Payer: Self-pay

## 2021-07-17 ENCOUNTER — Emergency Department (HOSPITAL_BASED_OUTPATIENT_CLINIC_OR_DEPARTMENT_OTHER)
Admission: EM | Admit: 2021-07-17 | Discharge: 2021-07-17 | Disposition: A | Discharge status: Home / Self Care | Payer: Medicare HMO | Attending: Emergency Medicine | Admitting: Emergency Medicine

## 2021-07-17 DIAGNOSIS — R1013 Epigastric pain: Secondary | ICD-10-CM

## 2021-07-17 MED ORDER — ALUM & MAG HYDROXIDE-SIMETH 200-200-20 MG/5ML PO SUSP
30.0000 mL | Freq: Once | ORAL | Status: AC
  Administered 2021-07-17: 30 mL via ORAL
  Filled 2021-07-17: qty 30

## 2021-07-17 MED ORDER — DICYCLOMINE HCL 20 MG PO TABS: 20.0000 mg | ORAL_TABLET | Freq: Two times a day (BID) | ORAL | 0 refills | Status: DC

## 2021-07-17 MED ORDER — DICYCLOMINE HCL 10 MG PO CAPS
10.0000 mg | ORAL_CAPSULE | Freq: Once | ORAL | Status: AC
  Administered 2021-07-17: 10 mg via ORAL
  Filled 2021-07-17: qty 1

## 2021-07-17 MED ORDER — LIDOCAINE VISCOUS HCL 2 % MT SOLN
15.0000 mL | Freq: Once | OROMUCOSAL | Status: AC
  Administered 2021-07-17: 15 mL via ORAL
  Filled 2021-07-17: qty 15

## 2021-07-17 MED ORDER — SUCRALFATE 1 G PO TABS: 1.0000 g | ORAL_TABLET | Freq: Three times a day (TID) | ORAL | 0 refills | Status: DC

## 2021-07-17 MED ORDER — ONDANSETRON HCL 4 MG/2ML IJ SOLN
4.0000 mg | Freq: Once | INTRAMUSCULAR | Status: AC
  Administered 2021-07-17: 4 mg via INTRAVENOUS
  Filled 2021-07-17: qty 2

## 2021-07-17 MED ORDER — ALUM & MAG HYDROXIDE-SIMETH 400-400-40 MG/5ML PO SUSP: 15.0000 mL | Freq: Four times a day (QID) | ORAL | 0 refills | Status: DC | PRN

## 2021-07-17 NOTE — ED Provider Notes (Signed)
[place] EMERGENCY DEPT Provider Note   CSN: [id number] Arrival date & time: 07/16/21  2116     History Chief Complaint  Patient presents with   Abdominal Pain    [name] is a 70 y.o. female.   Abdominal Pain Pain location:  Epigastric Pain quality: aching and sharp   Pain severity:  Mild Duration:  10 weeks Timing:  Constant Chronicity:  New Relieved by:  None tried Worsened by:  Nothing Ineffective treatments:  None tried Associated symptoms: no anorexia, no dysuria, no fatigue, no nausea and no shortness of breath       Past Medical History:  Diagnosis Date   Arthritis    R knee, hands    Back pain    Blood transfusion    1986- post-partial hysterectomy   Breast cancer ([place]) 2009   right lumpectomy and radiation   Chicken pox    Colon cancer ([place]) 2003   per pt report, cancer found in a colon polyp in 2003, procedure done in [place].  did not require surgery, chemo etc.     Depression    Diverticulitis    Edema of both ankles    Family history of breast cancer 10/24/2017   Gallbladder problem    Gastritis    GERD (gastroesophageal reflux disease)    recently taken off anti reflux tx   Heart murmur    functional murmur, echo in [place]- long time ago    Hepatitis    Hep. 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   Class 1 obesity due to excess calories with serious comorbidity and body mass index (BMI) of 32.0 to 32.9 in adult 02/17/2021   Psychophysiological insomnia  02/17/2021   Hyperlipidemia with target LDL less than 130 12/30/2020   Encounter for general adult medical examination with abnormal findings 12/30/2020   LVH (left ventricular hypertrophy) due to hypertensive disease, without heart failure 12/30/2020   History of breast cancer 08/07/2018   Family history of coronary artery disease in father 08/07/2018   Gastritis and gastroduodenitis    Genetic testing 11/24/2017   Family history of breast cancer 10/24/2017   Personal history of colon cancer 10/24/2017   Osteoarthritis of right knee 08/29/2011   Obesity (BMI 30.0-34.9) 12/24/2009   NEUROPATHY 09/04/2009   ARTHRALGIA 04/22/2009   Malignant neoplasm of upper-outer quadrant of right breast in female, estrogen receptor positive ([place]) 01/11/2008   Hiatal hernia 12/14/2007   DIVERTICULOSIS, COLON 12/14/2007   MAMMOGRAM, ABNORMAL, RIGHT 12/14/2007   RHEUMATIC FEVER 11/03/2007   Essential hypertension 11/03/2007   Lupus ([place]) 11/03/2007    Past Surgical History:  Procedure Laterality Date   [place] & tubal ligation    BIOPSY  07/25/2018   Procedure: BIOPSY;  Surgeon: Milus Banister, MD;  Location: [facility] ENDOSCOPY;  Service: Endoscopy;;   BREAST EXCISIONAL  BIOPSY Left 1998   BREAST LUMPECTOMY Right    2009   BREAST SURGERY     R breast lumpectomy   CHOLECYSTECTOMY     1978- open    ESOPHAGOGASTRODUODENOSCOPY (EGD) WITH PROPOFOL N/A 07/25/2018   Procedure: ESOPHAGOGASTRODUODENOSCOPY (EGD) WITH PROPOFOL;  Surgeon: Milus Banister, MD;  Location: [facility] ENDOSCOPY;  Service: Endoscopy;  Laterality: N/A;   REPLACEMENT TOTAL KNEE Left    TONSILLECTOMY     1954   TOTAL KNEE ARTHROPLASTY  08/30/2011   Procedure: TOTAL KNEE ARTHROPLASTY;  Surgeon: Kerin Salen;  Location: [facility];  Service: Orthopedics;  Laterality: Right;  Right Total Knee Arthroplasty   TUBAL LIGATION       OB History     Gravida  6   Para  6   Term  0   Preterm  0    AB  0   Living         SAB  0   IAB  0   Ectopic  0   Multiple      Live Births              Family History  Problem Relation Age of Onset   Breast cancer Mother 72   Hypertension Mother    Stroke Mother    Depression Mother    Heart attack Father    Other Father    Heart disease Father    Sudden death Father    Hypertension Sister    Hypertension Brother    Kidney disease Brother    Other Maternal Grandmother 41       brain tumor dx at 64- did not bx or do additional testing on tumor   Arthritis Maternal Grandmother    Cancer Paternal Grandmother 68       type unk   Breast cancer Maternal Aunt 74   Cancer Paternal Aunt 65       type unk   Cancer Other    Hypertension Other    Anesthesia problems Neg Hx    Hypotension Neg Hx    Malignant hyperthermia Neg Hx    Pseudochol deficiency Neg Hx    Colon cancer Neg Hx    Esophageal cancer Neg Hx    Stomach cancer Neg Hx    Pancreatic cancer Neg Hx    Liver disease Neg Hx     Social History   Tobacco Use   Smoking status: Never   Smokeless tobacco: Never  Vaping Use   Vaping Use: Never used  Substance Use Topics   Alcohol use: No   Drug use: No    Home Medications Prior to Admission medications   Medication Sig Start Date End Date Taking? Authorizing Provider  alum & mag hydroxide-simeth (MAALOX PLUS) 400-400-40 MG/5ML suspension Take 15 mLs by mouth every 6 (six) hours as needed for indigestion. 07/17/21  Yes Jaquesha Boroff, Corene Cornea, MD  dicyclomine (BENTYL) 20 MG tablet Take 1 tablet (20 mg total) by mouth 2 (two) times daily for 14 days. 07/17/21 07/31/21 Yes Chihiro Frey, Corene Cornea, MD  sucralfate (CARAFATE) 1 g tablet Take 1 tablet (1 g total) by mouth 4 (four) times daily -  with meals and at bedtime for 14 days. 07/17/21 07/31/21 Yes Quillan Whitter, Corene Cornea, MD  cholecalciferol (VITAMIN D) 1000 units tablet Take 1,000 Units by mouth daily.    [provider]  hydrochlorothiazide (HYDRODIURIL) 25 MG tablet Take  25 mg by mouth daily.    [provider]  ibuprofen (ADVIL) 800  MG tablet Take 800 mg by mouth every 8 (eight) hours as needed.    [provider]  losartan (COZAAR) 25 MG tablet Take 1 tablet (25 mg total) by mouth every other day. 09/29/20   Erlene Quan, PA-C  metoprolol succinate (TOPROL-XL) 25 MG 24 hr tablet Take 1 tablet (25 mg total) by mouth daily. 12/30/20   Janith Lima, MD  Multiple Vitamins-Minerals (EMERGEN-C IMMUNE PLUS PO) Take 1 tablet by mouth daily.    [provider]  omeprazole (PRILOSEC) 40 MG capsule Take 1 capsule (40 mg total) by mouth daily. 02/17/18   Magrinat, Virgie Dad, MD  potassium chloride (KLOR-CON) 10 MEQ tablet Take 1 tablet (10 mEq total) by mouth daily. 08/21/19   Magrinat, Virgie Dad, MD  tiZANidine (ZANAFLEX) 2 MG tablet as needed.    [provider]    Allergies    Iodine, Iohexol, Latex, Penicillins, Tetracycline, Other, Penicillin g, and Sulfa antibiotics  Review of Systems   Review of Systems  Constitutional:  Negative for fatigue.  Respiratory:  Negative for shortness of breath.   Gastrointestinal:  Positive for abdominal pain. Negative for anorexia and nausea.  Genitourinary:  Negative for dysuria.  All other systems reviewed and are negative.  Physical Exam Updated Vital Signs BP (!) 147/83 (BP Location: Right Arm)   Pulse 67   Temp 97.6 F (36.4 C) (Oral)   Resp 17   Ht 5\' 7"  (1.702 m)   Wt 88.9 kg   SpO2 100%   BMI 30.70 kg/m   Physical Exam Vitals and nursing note reviewed.  Constitutional:      Appearance: She is well-developed.  HENT:     Head: Normocephalic and atraumatic.  Cardiovascular:     Rate and Rhythm: Normal rate and regular rhythm.  Pulmonary:     Effort: No respiratory distress.     Breath sounds: No stridor.  Abdominal:     General: There is no distension.     Tenderness: There is no abdominal tenderness.  Musculoskeletal:     Cervical back: Normal range of motion.   Neurological:     Mental Status: She is alert.    ED Results / Procedures / Treatments   Labs (all labs ordered are listed, but only abnormal results are displayed) Labs Reviewed  COMPREHENSIVE METABOLIC PANEL - Abnormal; Notable for the following components:      Result Value   Potassium 3.4 (*)    All other components within normal limits  LIPASE, BLOOD  CBC  URINALYSIS, ROUTINE W REFLEX MICROSCOPIC  H. PYLORI ANTIBODY, IGG    EKG None  Radiology CT ABDOMEN PELVIS WO CONTRAST  Result Date: 07/16/2021 CLINICAL DATA:  Nausea vomiting pain EXAM: CT ABDOMEN AND PELVIS WITHOUT CONTRAST TECHNIQUE: Multidetector CT imaging of the abdomen and pelvis was performed following the standard protocol without IV contrast. COMPARISON:  CT 07/01/2021 FINDINGS: Lower chest: Lung bases demonstrate no acute consolidation or effusion. Normal cardiac size. Small hiatal hernia Hepatobiliary: No focal liver abnormality is seen. Status post cholecystectomy. No biliary dilatation. Pancreas: Unremarkable. No pancreatic ductal dilatation or surrounding inflammatory changes. Spleen: Normal in size without focal abnormality. Adrenals/Urinary Tract: Adrenal glands are unremarkable. Kidneys are normal, without renal calculi, focal lesion, or hydronephrosis. Bladder is unremarkable. Stomach/Bowel: Stomach is within normal limits. Appendix not well seen but no right lower quadrant inflammation. 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 No evidence of bowel wall thickening, distention, or inflammatory changes. Vascular/Lymphatic: Mild aortic atherosclerosis.  No aneurysm. No suspicious nodes. Reproductive: Status post hysterectomy.  No adnexal mass Other: Negative for pelvic effusion or free air. Small fat containing umbilical hernia. No bowel containing ventral hernia. Musculoskeletal: No acute or significant osseous findings. IMPRESSION: 1. No CT evidence for acute intra-abdominal or pelvic  abnormality. 2. Diverticular disease of the left colon without acute inflammatory process Electronically Signed   By: Donavan Foil M.D.   On: 07/16/2021 23:09    Procedures Procedures   Medications Ordered in ED Medications  alum & mag hydroxide-simeth (MAALOX/MYLANTA) 200-200-20 MG/5ML suspension 30 mL (30 mLs Oral Given 07/17/21 0539)    And  lidocaine (XYLOCAINE) 2 % viscous mouth solution 15 mL (15 mLs Oral Given 07/17/21 0539)  dicyclomine (BENTYL) capsule 10 mg (10 mg Oral Given 07/17/21 0540)  ondansetron (ZOFRAN) injection 4 mg (4 mg Intravenous Given 07/17/21 0540)    ED Course  I have reviewed the triage vital signs and the nursing notes.  Pertinent labs & imaging results that were available during my care of the patient were reviewed by me and considered in my medical decision making (see chart for details).    MDM Rules/Calculators/A&P                         Patient with multiple months of abdominal pain.  It seems to gotten significantly improved with Maalox and lidocaine.  We will DC to follow-up with GI as a suspect she probably has a peptic ulcer.  H. pylori ordered for follow-up by primary care doctor or GI.   Final Clinical Impression(s) / ED Diagnoses Final diagnoses:  Epigastric pain    Rx / DC Orders ED Discharge Orders          Ordered    alum & mag hydroxide-simeth (MAALOX PLUS) 916-945-03 MG/5ML suspension  Every 6 hours PRN        07/17/21 0621    sucralfate (CARAFATE) 1 g tablet  3 times daily with meals & bedtime        07/17/21 0621    dicyclomine (BENTYL) 20 MG tablet  2 times daily        07/17/21 8882             Arash Karstens, Corene Cornea, MD 07/17/21 [phone]

## 2021-07-20 ENCOUNTER — Ambulatory Visit (INDEPENDENT_AMBULATORY_CARE_PROVIDER_SITE_OTHER): Payer: Medicare HMO | Admitting: Bariatrics

## 2021-07-20 LAB — H. PYLORI ANTIBODY, IGG: H Pylori IgG: 0.52 Index Value (ref 0.00–0.79)

## 2021-07-21 ENCOUNTER — Ambulatory Visit (INDEPENDENT_AMBULATORY_CARE_PROVIDER_SITE_OTHER): Payer: Medicare HMO | Admitting: Bariatrics

## 2021-07-21 ENCOUNTER — Other Ambulatory Visit: Payer: Self-pay

## 2021-07-21 ENCOUNTER — Encounter (INDEPENDENT_AMBULATORY_CARE_PROVIDER_SITE_OTHER): Payer: Self-pay | Admitting: Bariatrics

## 2021-07-21 VITALS — BP 137/7 | HR 60 | Temp 98.2°F | Ht 67.0 in | Wt 195.0 lb

## 2021-07-21 DIAGNOSIS — E669 Obesity, unspecified: Secondary | ICD-10-CM

## 2021-07-21 DIAGNOSIS — K5909 Other constipation: Secondary | ICD-10-CM | POA: Diagnosis not present

## 2021-07-21 DIAGNOSIS — E559 Vitamin D deficiency, unspecified: Secondary | ICD-10-CM

## 2021-07-21 DIAGNOSIS — I10 Essential (primary) hypertension: Secondary | ICD-10-CM

## 2021-07-21 DIAGNOSIS — Z6831 Body mass index (BMI) 31.0-31.9, adult: Secondary | ICD-10-CM | POA: Diagnosis not present

## 2021-07-22 ENCOUNTER — Encounter: Payer: Self-pay | Admitting: Internal Medicine

## 2021-07-22 ENCOUNTER — Ambulatory Visit (INDEPENDENT_AMBULATORY_CARE_PROVIDER_SITE_OTHER): Payer: Medicare HMO | Admitting: Internal Medicine

## 2021-07-22 ENCOUNTER — Encounter (INDEPENDENT_AMBULATORY_CARE_PROVIDER_SITE_OTHER): Payer: Self-pay | Admitting: Bariatrics

## 2021-07-22 DIAGNOSIS — K5900 Constipation, unspecified: Secondary | ICD-10-CM | POA: Diagnosis not present

## 2021-07-22 DIAGNOSIS — K2970 Gastritis, unspecified, without bleeding: Secondary | ICD-10-CM | POA: Diagnosis not present

## 2021-07-22 DIAGNOSIS — K2990 Gastroduodenitis, unspecified, without bleeding: Secondary | ICD-10-CM | POA: Diagnosis not present

## 2021-07-22 MED ORDER — POTASSIUM CHLORIDE ER 10 MEQ PO TBCR: 10.0000 meq | EXTENDED_RELEASE_TABLET | Freq: Every day | ORAL | 1 refills | Status: DC

## 2021-07-22 NOTE — Progress Notes (Signed)
   Subjective:   Patient ID: [name], female    DOB: Jan 11, 1951, 70 y.o.   MRN: [id number]  HPI The patient is a 70 YO female coming in for ER follow up epigastric pain.  PMH, [facility], social history reviewed and updated  Review of Systems  Constitutional: Negative.   HENT: Negative.    Eyes: Negative.   Respiratory:  Negative for cough, chest tightness and shortness of breath.   Cardiovascular:  Negative for chest pain, palpitations and leg swelling.  Gastrointestinal:  Positive for abdominal pain and constipation. Negative for abdominal distention, diarrhea, nausea and vomiting.  Musculoskeletal: Negative.   Skin: Negative.   Neurological: Negative.   Psychiatric/Behavioral: Negative.     Objective:  Physical Exam Constitutional:      Appearance: She is well-developed.  HENT:     Head: Normocephalic and atraumatic.  Cardiovascular:     Rate and Rhythm: Normal rate and regular rhythm.  Pulmonary:     Effort: Pulmonary effort is normal. No respiratory distress.     Breath sounds: Normal breath sounds. No wheezing or rales.  Abdominal:     General: Bowel sounds are normal. There is no distension.     Palpations: Abdomen is soft.     Tenderness: There is abdominal tenderness. There is no rebound.     Comments: Epigastric tenderness no radiation  Musculoskeletal:     Cervical back: Normal range of motion.  Skin:    General: Skin is warm and dry.  Neurological:     Mental Status: She is alert and oriented to person, place, and time.     Coordination: Coordination normal.    Vitals:   07/22/21 0803  BP: 124/82  Pulse: (!) 58  Resp: 18  SpO2: 99%  Weight: 197 lb 6.4 oz (89.5 kg)  Height: 5\' 7"  (1.702 m)    This visit occurred during the SARS-CoV-2 public health emergency.  Safety protocols were in place, including screening questions prior to the visit, additional usage of staff PPE, and extensive cleaning of exam room while observing appropriate contact time as  indicated for disinfecting solutions.   Assessment & Plan:  Visit time 25 minutes in face to face communication with patient and coordination of care, additional 5 minutes spent in record review, coordination or care, ordering tests, communicating/referring to other healthcare professionals, documenting in medical records all on the same day of the visit for total time 30 minutes spent on the visit.

## 2021-07-22 NOTE — Patient Instructions (Signed)
We will have you stop the sucralfate as this is not helping.  It is okay to stop the bentyl since this is not helping either.   I would recommend to keep using the metamucil to go to the bathroom regularly.  I will have you double the omeprazole for 2 weeks so take 1 pill in the morning and 1 pill in the evening.

## 2021-07-22 NOTE — Progress Notes (Signed)
Chief Complaint:   OBESITY [name] is here to discuss her progress with her obesity treatment plan along with follow-up of her obesity related diagnoses. [name] is on the Category 1 Plan and states she is following her eating plan approximately 85% of the time. [name] states she is walking for 30 minutes 3 times per week.  Today's visit was #: 4 Starting weight: 199 lbs Starting date: 06/18/2021 Today's weight: 195 lbs Today's date: 07/21/2021 Total lbs lost to date: 4 lbs Total lbs lost since last in-office visit: 4 lbs  Interim History: [name] is down 4 additional pounds since her last visit. She has not struggled with the plan. She has had GI issues. Referred to GI.   Subjective:   1. Vitamin D deficiency [name] is currently taking Vitamin D.   2. Essential hypertension [name]'s blood pressure is reasonably well controlled.   3. Other constipation [name] will increase her water intake and raw vegetables.   Assessment/Plan:   1. Vitamin D deficiency Low Vitamin D level contributes to fatigue and are associated with obesity, breast, and colon cancer. [name] agrees to continue to take prescription Vitamin D 1,000 IU every week and she will follow-up for routine testing of Vitamin D, at least 2-3 times per year to avoid over-replacement.  2. Essential hypertension [name] will continue taking her medications. She is working on healthy weight loss and exercise to improve blood pressure control. We will watch for signs of hypotension as she continues her lifestyle modifications.  3. Other constipation [name] was informed that a decrease in bowel movement frequency is normal while losing weight, but stools should not be hard or painful. She will use Fleet suppositories. Fleet  enema as needed. She will take Miralax daily with water, coffee or tea. She will follow up with GI. Orders and follow up as documented in patient record.   Counseling Getting to Good Bowel  Health: Your goal is to have one soft bowel movement each day. Drink at least 8 glasses of water each day. Eat plenty of fiber (goal is over 25 grams each day). It is best to get most of your fiber from dietary sources which includes leafy green vegetables, fresh fruit, and whole grains. You may need to add fiber with the help of OTC fiber supplements. These include Metamucil, Citrucel, and Flaxseed. If you are still having trouble, try adding Miralax or Magnesium Citrate. If all of these changes do not work, Cabin crew.   4. Class 1 obesity with serious comorbidity and body mass index (BMI) of 31.0 to 31.9 in adult, unspecified obesity type [name] is currently in the action stage of change. As such, her goal is to continue with weight loss efforts. She has agreed to the Category 1 Plan.   [name] will continue to adhere closely to the plan at 80-90%. Strategies for Thanksgiving was discussed today.   Exercise goals:  As is.  Behavioral modification strategies: increasing lean protein intake, decreasing simple carbohydrates, increasing vegetables, increasing water intake, decreasing eating out, no skipping meals, meal planning and cooking strategies, keeping healthy foods in the home, ways to avoid boredom eating, and planning for success.  [name] has agreed to follow-up with our clinic in 2 weeks. She was informed of the importance of frequent follow-up visits to maximize her success with intensive lifestyle modifications for her multiple health conditions.   Objective:   Blood pressure (!) 137/7, pulse 60, temperature 98.2 F (36.8 C), height 5\' 7"  (1.702 m),  weight 195 lb (88.5 kg), SpO2 100 %. Body mass index is 30.54 kg/m.  General: Cooperative, alert, well developed, in no acute distress. HEENT: Conjunctivae and lids unremarkable. Cardiovascular: Regular rhythm.  Lungs: Normal work of breathing. Neurologic: No focal deficits.   Lab Results  Component Value Date    CREATININE 0.69 07/16/2021   BUN 13 07/16/2021   NA 137 07/16/2021   K 3.4 (L) 07/16/2021   CL 100 07/16/2021   CO2 27 07/16/2021   Lab Results  Component Value Date   ALT 19 07/16/2021   AST 24 07/16/2021   ALKPHOS 79 07/16/2021   BILITOT 0.3 07/16/2021   Lab Results  Component Value Date   HGBA1C 5.7 (H) 06/29/2021   HGBA1C 5.7 12/30/2020   HGBA1C 5.6 04/29/2020   HGBA1C 5.5 10/05/2014   Lab Results  Component Value Date   INSULIN 16.3 06/29/2021   Lab Results  Component Value Date   TSH 1.610 06/29/2021   Lab Results  Component Value Date   CHOL 153 06/29/2021   HDL 57 06/29/2021   LDLCALC 80 06/29/2021   TRIG 83 06/29/2021   CHOLHDL 3 12/30/2020   Lab Results  Component Value Date   VD25OH 45.6 06/29/2021   VD25OH 27 (L) 12/05/2008   VD25OH 33 04/24/2008   Lab Results  Component Value Date   WBC 5.5 07/16/2021   HGB 12.3 07/16/2021   HCT 37.1 07/16/2021   MCV 92.8 07/16/2021   PLT 225 07/16/2021   Lab Results  Component Value Date   IRON 156 (H) 06/11/2021   TIBC 389 06/11/2021   FERRITIN 18 06/11/2021   Attestation Statements:   Reviewed by clinician on day of visit: allergies, medications, problem list, medical history, surgical history, family history, social history, and previous encounter notes.  Time spent on visit including pre-visit chart review and post-visit care and charting was 30 minutes.   I, Lizbeth Bark, RMA, am acting as Location manager for [employer], DO.   I have reviewed the above documentation for accuracy and completeness, and I agree with the above. Jearld Lesch, DO

## 2021-07-24 NOTE — Assessment & Plan Note (Signed)
Stop sucralfate and bentyl as they are not helping. We will double omeprazole to BID to see if this helps.

## 2021-07-24 NOTE — Assessment & Plan Note (Signed)
Advised her to start taking metamucil regularly to help with her constipation.

## 2021-08-11 ENCOUNTER — Other Ambulatory Visit: Payer: Self-pay

## 2021-08-11 ENCOUNTER — Ambulatory Visit (INDEPENDENT_AMBULATORY_CARE_PROVIDER_SITE_OTHER): Payer: Medicare HMO | Admitting: Bariatrics

## 2021-08-11 ENCOUNTER — Encounter (INDEPENDENT_AMBULATORY_CARE_PROVIDER_SITE_OTHER): Payer: Self-pay | Admitting: Bariatrics

## 2021-08-11 VITALS — BP 146/84 | HR 60 | Temp 97.9°F | Ht 67.0 in | Wt 193.0 lb

## 2021-08-11 DIAGNOSIS — E669 Obesity, unspecified: Secondary | ICD-10-CM

## 2021-08-11 DIAGNOSIS — Z6831 Body mass index (BMI) 31.0-31.9, adult: Secondary | ICD-10-CM

## 2021-08-11 DIAGNOSIS — I10 Essential (primary) hypertension: Secondary | ICD-10-CM | POA: Diagnosis not present

## 2021-08-11 DIAGNOSIS — R7303 Prediabetes: Secondary | ICD-10-CM | POA: Diagnosis not present

## 2021-08-11 NOTE — Progress Notes (Signed)
Chief Complaint:   OBESITY [name] is here to discuss her progress with her obesity treatment plan along with follow-up of her obesity related diagnoses. [name] is on the Category 1 Plan and states she is following her eating plan approximately 70% of the time. [name] states she is walking for 30 minutes 3 times per week.  Today's visit was #: 5 Starting weight: 199 lbs Starting date: 06/18/2021 Today's weight: 193 lbs Today's date: 08/11/2021 Total lbs lost to date: 6 lbs Total lbs lost since last in-office visit: 2 lbs  Interim History: [name] is down an additional 2 lbs since her last visit. She is getting adequate water.  Subjective:   1. Essential hypertension [name] is currently taking HCTZ, Cozaar and Toprol XL. [name]'s blood pressure is stable.  2. Prediabetes [name] is not on medications currently.   Assessment/Plan:   1. Essential hypertension [name] will continue her medications. She is working on healthy weight loss and exercise to improve blood pressure control. We will watch for signs of hypotension as she continues her lifestyle modifications.  2. Prediabetes [name] will continue to work on weight loss, exercise, and decreasing simple carbohydrates to help decrease the risk of diabetes. She will be eating more healthy fats and protein.    3. Obesity, current BMI 30.2 [name] is currently in the action stage of change. As such, her goal is to continue with weight loss efforts. She has agreed to the Category 1 Plan.   [name] will continue meal planning and she will continue meal planning.  Exercise goals:  As is.  Behavioral modification strategies: increasing lean protein intake, decreasing simple carbohydrates, increasing vegetables, increasing water intake, decreasing eating out, no skipping meals, meal planning and cooking strategies, keeping healthy foods in the home, and planning for success.  [name] has agreed to follow-up with our clinic  in 4 weeks. She was informed of the importance of frequent follow-up visits to maximize her success with intensive lifestyle modifications for her multiple health conditions.   Objective:   Blood pressure (!) 146/84, pulse 60, temperature 97.9 F (36.6 C), height 5\' 7"  (1.702 m), weight 193 lb (87.5 kg), SpO2 99 %. Body mass index is 30.23 kg/m.  General: Cooperative, alert, well developed, in no acute distress. HEENT: Conjunctivae and lids unremarkable. Cardiovascular: Regular rhythm.  Lungs: Normal work of breathing. Neurologic: No focal deficits.   Lab Results  Component Value Date   CREATININE 0.69 07/16/2021   BUN 13 07/16/2021   NA 137 07/16/2021   K 3.4 (L) 07/16/2021   CL 100 07/16/2021   CO2 27 07/16/2021   Lab Results  Component Value Date   ALT 19 07/16/2021   AST 24 07/16/2021   ALKPHOS 79 07/16/2021   BILITOT 0.3 07/16/2021   Lab Results  Component Value Date   HGBA1C 5.7 (H) 06/29/2021   HGBA1C 5.7 12/30/2020   HGBA1C 5.6 04/29/2020   HGBA1C 5.5 10/05/2014   Lab Results  Component Value Date   INSULIN 16.3 06/29/2021   Lab Results  Component Value Date   TSH 1.610 06/29/2021   Lab Results  Component Value Date   CHOL 153 06/29/2021   HDL 57 06/29/2021   LDLCALC 80 06/29/2021   TRIG 83 06/29/2021   CHOLHDL 3 12/30/2020   Lab Results  Component Value Date   VD25OH 45.6 06/29/2021   VD25OH 27 (L) 12/05/2008   VD25OH 33 04/24/2008   Lab Results  Component Value Date   WBC 5.5 07/16/2021  HGB 12.3 07/16/2021   HCT 37.1 07/16/2021   MCV 92.8 07/16/2021   PLT 225 07/16/2021   Lab Results  Component Value Date   IRON 156 (H) 06/11/2021   TIBC 389 06/11/2021   FERRITIN 18 06/11/2021   Attestation Statements:   Reviewed by clinician on day of visit: allergies, medications, problem list, medical history, surgical history, family history, social history, and previous encounter notes. DO.   I, Lizbeth Bark, RMA, am acting as  Location manager for [employer],   [employer], DO

## 2021-08-17 ENCOUNTER — Encounter (INDEPENDENT_AMBULATORY_CARE_PROVIDER_SITE_OTHER): Payer: Self-pay | Admitting: Bariatrics

## 2021-08-25 ENCOUNTER — Ambulatory Visit (INDEPENDENT_AMBULATORY_CARE_PROVIDER_SITE_OTHER): Payer: Medicare HMO | Admitting: Adult Health

## 2021-08-28 ENCOUNTER — Encounter (INDEPENDENT_AMBULATORY_CARE_PROVIDER_SITE_OTHER): Payer: Self-pay

## 2021-08-31 ENCOUNTER — Ambulatory Visit (INDEPENDENT_AMBULATORY_CARE_PROVIDER_SITE_OTHER): Payer: Medicare HMO | Admitting: Family Medicine

## 2021-08-31 ENCOUNTER — Encounter (INDEPENDENT_AMBULATORY_CARE_PROVIDER_SITE_OTHER): Payer: Self-pay

## 2021-09-01 ENCOUNTER — Other Ambulatory Visit: Payer: Self-pay

## 2021-09-01 ENCOUNTER — Encounter (INDEPENDENT_AMBULATORY_CARE_PROVIDER_SITE_OTHER): Payer: Self-pay | Admitting: Family Medicine

## 2021-09-01 ENCOUNTER — Ambulatory Visit (INDEPENDENT_AMBULATORY_CARE_PROVIDER_SITE_OTHER): Payer: Medicare HMO | Admitting: Family Medicine

## 2021-09-01 VITALS — BP 109/71 | HR 68 | Temp 97.9°F | Ht 67.0 in | Wt 191.0 lb

## 2021-09-01 DIAGNOSIS — I10 Essential (primary) hypertension: Secondary | ICD-10-CM

## 2021-09-01 DIAGNOSIS — R7303 Prediabetes: Secondary | ICD-10-CM | POA: Diagnosis not present

## 2021-09-01 DIAGNOSIS — E669 Obesity, unspecified: Secondary | ICD-10-CM

## 2021-09-01 DIAGNOSIS — E559 Vitamin D deficiency, unspecified: Secondary | ICD-10-CM | POA: Diagnosis not present

## 2021-09-01 DIAGNOSIS — Z6831 Body mass index (BMI) 31.0-31.9, adult: Secondary | ICD-10-CM | POA: Diagnosis not present

## 2021-09-02 NOTE — Progress Notes (Signed)
Chief Complaint:   OBESITY [name] is here to discuss her progress with her obesity treatment plan along with follow-up of her obesity related diagnoses. [name] is on the Category 1 Plan and states she is following her eating plan approximately 90% of the time. [name] states she is walking for 30 minutes 3 times per week.  Today's visit was #: 6 Starting weight: 199 lbs Starting date: 06/18/2021 Today's weight: 191 lbs Today's date: 09/01/2021 Total lbs lost to date: 8 Total lbs lost since last in-office visit: 2  Interim History: [name] has 2 meals per day on an average, breakfast and dinner. She doesn't eat all of her protein and she is not weighing but she will have a protein shake every day. She denies hunger or cravings. She is drinking 33-66 oz of water daily.  Subjective:   1. White coat syndrome with diagnosis of hypertension [name]'s blood pressure was elevated at 146/84 at her last office visit. A review of her chart shows on 09/29/2020 during her Cardiology office visit, she is supposed to take 50 mg of Toprol XL. Her blood pressure is at goal today.  BP Readings from Last 3 Encounters:  09/01/21 109/71  08/11/21 (!) 146/84  07/22/21 124/82   2. Pre-diabetes [name] is stable and at goal. She denies cravings for carbohydrates. Her last A1c was 5.7 on 06/29/2021.  3. Vitamin D deficiency [name] hasn't had a bone density in years per the patient. She takes 1,000 IU daily OTC vit D.  Assessment/Plan:  No orders of the defined types were placed in this encounter.   Medications Discontinued During This Encounter  Medication Reason   alum & mag hydroxide-simeth (MAALOX PLUS) 400-400-40 MG/5ML suspension    dicyclomine (BENTYL) 20 MG tablet    sucralfate (CARAFATE) 1 g tablet      No orders of the defined types were placed in this encounter.    1. White coat syndrome with diagnosis of hypertension [name] will continue her medications per her Cardiologist,  Dr. Oval Linsey. She will continue at home blood pressure monitoring.  2. Pre-diabetes [name] will continue her prudent nutritional plan, weight loss, and decreasing simple carbohydrates to help decrease the risk of diabetes.   3. Vitamin D deficiency [name] will continue her OTC Vitamin D supplementation, and she is to contact her primary care physician in regards to bone density every 2 years. She is to increase weight bearing activity.  4. Obesity with current BMI of 29.9 [name] is currently in the action stage of change. As such, her goal is to continue with weight loss efforts. She has agreed to the Category 1 Plan.   Exercise goals: [name]'s goal is to walk 10,000 steps daily. For substantial health benefits, adults should do at least 150 minutes (2 hours and 30 minutes) a week of moderate-intensity, or 75 minutes (1 hour and 15 minutes) a week of vigorous-intensity aerobic physical activity, or an equivalent combination of moderate- and vigorous-intensity aerobic activity. Aerobic activity should be performed in episodes of at least 10 minutes, and preferably, it should be spread throughout the week.  Behavioral modification strategies: increasing lean protein intake, increasing water intake, decreasing sodium intake, and no skipping meals.  [name] has agreed to follow-up with our clinic in 3 weeks. She was informed of the importance of frequent follow-up visits to maximize her success with intensive lifestyle modifications for her multiple health conditions.   Objective:   Blood pressure 109/71, pulse 68, temperature 97.9 F (36.6 C),  height 5\' 7"  (1.702 m), weight 191 lb (86.6 kg), SpO2 96 %. Body mass index is 29.91 kg/m.  General: Cooperative, alert, well developed, in no acute distress. HEENT: Conjunctivae and lids unremarkable. Cardiovascular: Regular rhythm.  Lungs: Normal work of breathing. Neurologic: No focal deficits.   Lab Results  Component Value Date    CREATININE 0.69 07/16/2021   BUN 13 07/16/2021   NA 137 07/16/2021   K 3.4 (L) 07/16/2021   CL 100 07/16/2021   CO2 27 07/16/2021   Lab Results  Component Value Date   ALT 19 07/16/2021   AST 24 07/16/2021   ALKPHOS 79 07/16/2021   BILITOT 0.3 07/16/2021   Lab Results  Component Value Date   HGBA1C 5.7 (H) 06/29/2021   HGBA1C 5.7 12/30/2020   HGBA1C 5.6 04/29/2020   HGBA1C 5.5 10/05/2014   Lab Results  Component Value Date   INSULIN 16.3 06/29/2021   Lab Results  Component Value Date   TSH 1.610 06/29/2021   Lab Results  Component Value Date   CHOL 153 06/29/2021   HDL 57 06/29/2021   LDLCALC 80 06/29/2021   TRIG 83 06/29/2021   CHOLHDL 3 12/30/2020   Lab Results  Component Value Date   VD25OH 45.6 06/29/2021   VD25OH 27 (L) 12/05/2008   VD25OH 33 04/24/2008   Lab Results  Component Value Date   WBC 5.5 07/16/2021   HGB 12.3 07/16/2021   HCT 37.1 07/16/2021   MCV 92.8 07/16/2021   PLT 225 07/16/2021   Lab Results  Component Value Date   IRON 156 (H) 06/11/2021   TIBC 389 06/11/2021   FERRITIN 18 06/11/2021    Obesity Behavioral Intervention:   Approximately 15 minutes were spent on the discussion below.  ASK: We discussed the diagnosis of obesity with [name] today and [name] agreed to give [name] permission to discuss obesity behavioral modification therapy today.  ASSESS: [name] has the diagnosis of obesity and her BMI today is 29.9. [name] is in the action stage of change.   ADVISE: [name] was educated on the multiple health risks of obesity as well as the benefit of weight loss to improve her health. She was advised of the need for long term treatment and the importance of lifestyle modifications to improve her current health and to decrease her risk of future health problems.  AGREE: Multiple dietary modification options and treatment options were discussed and [name] agreed to follow the recommendations documented in the above  note.  ARRANGE: [name] was educated on the importance of frequent visits to treat obesity as outlined per CMS and USPSTF guidelines and agreed to schedule her next follow up appointment today.  Attestation Statements:   Reviewed by clinician on day of visit: allergies, medications, problem list, medical history, surgical history, family history, social history, and previous encounter notes.   Wilhemena Durie, am acting as transcriptionist for [employer], DO.  I have reviewed the above documentation for accuracy and completeness, and I agree with the above. [name], D.O.  The [place] was signed into law in 2016 which includes the topic of electronic health records.  This provides immediate access to information in MyChart.  This includes consultation notes, operative notes, office notes, lab results and pathology reports.  If you have any questions about what you read please let us know at your next visit so we can discuss your concerns and take corrective action if need be.  We are right  here with you.

## 2021-09-17 ENCOUNTER — Ambulatory Visit: Payer: Medicare HMO | Admitting: Nurse Practitioner

## 2021-09-17 ENCOUNTER — Encounter: Payer: Self-pay | Admitting: Nurse Practitioner

## 2021-09-17 ENCOUNTER — Other Ambulatory Visit (INDEPENDENT_AMBULATORY_CARE_PROVIDER_SITE_OTHER): Payer: Medicare HMO

## 2021-09-17 VITALS — BP 120/70 | HR 53 | Ht 67.0 in | Wt 197.0 lb

## 2021-09-17 DIAGNOSIS — Z85038 Personal history of other malignant neoplasm of large intestine: Secondary | ICD-10-CM

## 2021-09-17 DIAGNOSIS — R350 Frequency of micturition: Secondary | ICD-10-CM

## 2021-09-17 DIAGNOSIS — R1010 Upper abdominal pain, unspecified: Secondary | ICD-10-CM

## 2021-09-17 LAB — COMPREHENSIVE METABOLIC PANEL
ALT: 20 U/L (ref 0–35)
AST: 27 U/L (ref 0–37)
Albumin: 4 g/dL (ref 3.5–5.2)
Alkaline Phosphatase: 90 U/L (ref 39–117)
BUN: 14 mg/dL (ref 6–23)
CO2: 28 mEq/L (ref 19–32)
Calcium: 9.5 mg/dL (ref 8.4–10.5)
Chloride: 102 mEq/L (ref 96–112)
Creatinine, Ser: 0.7 mg/dL (ref 0.40–1.20)
GFR: 86.3 mL/min (ref >60.00)
Glucose, Bld: 88 mg/dL (ref 70–99)
Potassium: 3.6 mEq/L (ref 3.5–5.1)
Sodium: 137 mEq/L (ref 135–145)
Total Bilirubin: 0.4 mg/dL (ref 0.2–1.2)
Total Protein: 7.7 g/dL (ref 6.0–8.3)

## 2021-09-17 LAB — URINALYSIS, ROUTINE W REFLEX MICROSCOPIC
Bilirubin Urine: NEGATIVE
Ketones, ur: NEGATIVE
Leukocytes,Ua: NEGATIVE
Nitrite: NEGATIVE
Specific Gravity, Urine: <=1.005 — AB (ref 1.000–1.030)
Total Protein, Urine: NEGATIVE
Urine Glucose: NEGATIVE
Urobilinogen, UA: 0.2 (ref 0.0–1.0)
pH: 6.5 (ref 5.0–8.0)

## 2021-09-17 LAB — CBC WITH DIFFERENTIAL/PLATELET
Basophils Absolute: 0 10*3/uL (ref 0.0–0.1)
Basophils Relative: 0.5 % (ref 0.0–3.0)
Eosinophils Absolute: 0.2 10*3/uL (ref 0.0–0.7)
Eosinophils Relative: 5 % (ref 0.0–5.0)
HCT: 38 % (ref 36.0–46.0)
Hemoglobin: 12.5 g/dL (ref 12.0–15.0)
Lymphocytes Relative: 52.4 % — ABNORMAL HIGH (ref 12.0–46.0)
Lymphs Abs: 2.5 10*3/uL (ref 0.7–4.0)
MCHC: 33 g/dL (ref 30.0–36.0)
MCV: 94.4 fl (ref 78.0–100.0)
Monocytes Absolute: 0.6 10*3/uL (ref 0.1–1.0)
Monocytes Relative: 12.4 % — ABNORMAL HIGH (ref 3.0–12.0)
Neutro Abs: 1.4 10*3/uL (ref 1.4–7.7)
Neutrophils Relative %: 29.7 % — ABNORMAL LOW (ref 43.0–77.0)
Platelets: 181 10*3/uL (ref 150.0–400.0)
RBC: 4.03 Mil/uL (ref 3.87–5.11)
RDW: 13.5 % (ref 11.5–15.5)
WBC: 4.7 10*3/uL (ref 4.0–10.5)

## 2021-09-17 LAB — C-REACTIVE PROTEIN: CRP: <1 mg/dL (ref 0.5–20.0)

## 2021-09-17 MED ORDER — DICYCLOMINE HCL 10 MG PO CAPS: 10.0000 mg | ORAL_CAPSULE | Freq: Two times a day (BID) | ORAL | 0 refills | Status: DC | PRN

## 2021-09-17 MED ORDER — SUPREP BOWEL PREP KIT 17.5-3.13-1.6 GM/177ML PO SOLN: 1.0000 | ORAL | 0 refills | Status: DC

## 2021-09-17 NOTE — Progress Notes (Signed)
09/17/2021 [name] [id number] 31-Dec-1950   CHIEF COMPLAINT: Acid reflux, abdominal pain  HISTORY OF PRESENT ILLNESS:  [name] is a 70 year old female with a past medical history of depression, Lupus, rheumatoid arthritis, breast cancer s/p left lumpectomy, chemo and radiation 1998 and right breast cancer s/p lumpectomy and radiation 2009, GERD, stomach ulcers 1970's, diverticulosis and a cancerous colon polyp 2003. Covid 03/2021. S/P cholecystectomy in 1978.  She presents to our office today for further evaluation regarding acid reflux, upper abdominal pain and to schedule a colonoscopy.  She has persistent right and left flank pain which radiates to the upper abdomen since she was diagnosed with COVID infection 03/2021.  She underwent an abdominal/pelvic CT scan 07/16/2021 which showed diverticular disease to the left colon otherwise was unremarkable.  She stated the longer she is up during the day the worst this pain is.  She awakens in the morning and feels well and the bilateral flank and upper abdominal pain recurs and progressively worsens throughout the day and late evening.  Eating does not worsen or improve this pain.  No nausea or vomiting.  She has heartburn once every 2 weeks.  She feels full more easily.  She vomited partially digested food during the middle the night 2 weeks ago.  No dysphagia.  She takes Omeprazole 40 mg daily for the past few years.  She underwent an EGD by Dr. Edison Nasuti 07/25/2018 which showed mild gastritis (biopsies negative for H. Pylori) and a 2 cm hiatal hernia.  She is passing 3-4 hard to soft brown bowel movements daily since she had COVID as well.  No rectal bleeding or black stools.  She reported having a history of colon cancer, had one cancerous polyp removed  in 2003 without requiring surgical intervention or chemotherapy.  Her most recent colonoscopy was 01/06/2011.  She underwent a Cologuard 12/31/2020 which was negative.  Mild increased  urinary frequency.  She is intentionally trying to lose weight and is followed by the [facility].  No significant weight loss at this point.  She weighs 197 pounds today.  EGD 07/25/2018 Dr. Ardis Hughs: - Minimal distal gastritis, biopsied. - Small, 2cm hiatal hernia. - The examination was otherwise normal ANTRAL AND CORPUS MUCOSA WITH MILD CHRONIC GASTRITIS. - WARTHIN-STARRY STAIN NEGATIVE FOR HELICOBACTER PYLORI. - NO INTESTINAL METAPLASIA, DYSPLASIA OR MALIGNANCY.  Colonoscopy 01/06/2011 by Dr. Collene Mares: Sigmoid diverticulosis, no polyps Recall colonoscopy 5 years  Cologuard Test 12/31/2020:  CTAP 07/16/2021: 1. No CT evidence for acute intra-abdominal or pelvic abnormality. 2. Diverticular disease of the left colon without acute inflammatory process    CBC Latest Ref Rng & Units 07/16/2021 06/11/2021 03/25/2021  WBC 4.0 - 10.5 K/uL 5.5 4.9 4.6  Hemoglobin 12.0 - 15.0 g/dL 12.3 12.0 11.6(L)  Hematocrit 36.0 - 46.0 % 37.1 37.0 34.4(L)  Platelets 150 - 400 K/uL 225 197 187    CMP Latest Ref Rng & Units 07/16/2021 06/11/2021 03/25/2021  Glucose 70 - 99 mg/dL 87 81 121(H)  BUN 8 - 23 mg/dL 13 11 10   Creatinine 0.44 - 1.00 mg/dL 0.69 0.87 0.74  Sodium 135 - 145 mmol/L 137 140 137  Potassium 3.5 - 5.1 mmol/L 3.4(L) 3.6 3.1(L)  Chloride 98 - 111 mmol/L 100 107 103  CO2 22 - 32 mmol/L 27 24 24   Calcium 8.9 - 10.3 mg/dL 9.7 9.5 9.1  Total Protein 6.5 - 8.1 g/dL 7.3 7.4 7.0  Total Bilirubin 0.3 -  1.2 mg/dL 0.3 0.4 0.5  Alkaline Phos 38 - 126 U/L 79 104 67  AST 15 - 41 U/L 24 28 32  ALT 0 - 44 U/L 19 23 26      Past Medical History:  Diagnosis Date   Arthritis    R knee, hands    Back pain    Blood transfusion    1986- post-partial hysterectomy   Breast cancer ([place]) 2009   right lumpectomy and radiation   Chicken pox    Colon cancer ([place]) 2003   per pt report, cancer found in a colon polyp in 2003, procedure done in [place].  did not require surgery,  chemo etc.     Depression    Diverticulitis    Edema of both ankles    Family history of breast cancer 10/24/2017   Gallbladder problem    Gastritis    GERD (gastroesophageal reflux disease)    recently taken off anti reflux tx   Heart murmur    functional murmur, echo in [place]- long time ago    Hepatitis    Hep. A- 1978   Hiatal hernia    History of stomach ulcers    Hypertension    Joint pain    Lactose intolerance    Lupus ([place])    Multiple gastric ulcers    Palpitations    Personal history of radiation therapy 2009   Rheumatic fever    Rheumatoid arthritis ([place])    SLE (systemic lupus erythematosus related syndrome) ([place])    Urinary incontinence    UTI (urinary tract infection)    Past Surgical History:  Procedure Laterality Date   Gene Autry & tubal ligation    BIOPSY  07/25/2018   Procedure: BIOPSY;  Surgeon: Milus Banister, MD;  [facility] END[facility]COPY;  Service: Endoscopy;;   BREAST EXCISIONAL BIOPSY Left 1998   BREAST LUMPECTOMY Right    2009   BREAST SURGERY     R breast lumpectomy   CHOLECYSTECTOMY     1978- open    ESOPHAGOGASTRODUODENOSCOPY (EGD) WITH PROPOFOL N/A 07/25/2018   Procedure: ESOPHAGOGASTRODUODENOSCOPY (EGD) WITH PROPOFOL;  Surgeon: Milus Banister, MD;  Location: [place];  Service: Endoscopy;  Laterality: N/A;   REPLACEMENT TOTAL KNEE Left    TONSILLECTOMY     1954   TOTAL KNEE ARTHROPLASTY  08/30/2011   Procedure: TOTAL KNEE ARTHROPLASTY;  Surgeon: Kerin Salen;  Location: [facility];  Service: Orthopedics;  Laterality: Right;  Right Total Knee Arthroplasty   TUBAL LIGATION     Social History: Non-smoker.  No alcohol use.  No drug use.  Family History: family history includes Arthritis in her maternal grandmother; Breast cancer (age of onset: 31) in her maternal aunt; Breast cancer (age of onset: 40) in her mother; Cancer in an other family member; Cancer (age of onset: 110) in her paternal  aunt; Cancer (age of onset: 104) in her paternal grandmother; Depression in her mother; Heart attack in her father; Heart disease in her father; Hypertension in her brother, mother, sister, and another family member; Kidney disease in her brother; Other in her father; Other (age of onset: 64) in her maternal grandmother; Stroke in her mother; Sudden death in her father.  No known family history of esophageal, gastric or colon cancer.   Allergies  Allergen Reactions   Iodine Anaphylaxis   Iohexol Anaphylaxis     Code: HIVES, Desc: PT IS ALLERGIC  TO IODINATED CONTRAST; REACTION INCLUDES FACIAL SWELLING,HIVES,RESPIRATORY DISTRESS;PT HASN'T HAD SCAN W/PREMEDS.;REFUSED CONTRAST OF ANY KIND!  KR    Latex Anaphylaxis   Penicillins Anaphylaxis and Rash    Has patient had a PCN reaction causing immediate rash, facial/tongue/throat swelling, SOB or lightheadedness with hypotension: Yes Has patient had a PCN reaction causing severe rash involving mucus membranes or skin necrosis: Yes Has patient had a PCN reaction that required hospitalization: No Has patient had a PCN reaction occurring within the last 10 years: No If all of the above answers are "NO", then may proceed with Cephalosporin use.    Tetracycline Anaphylaxis   Other Other (See Comments)   Penicillin G Other (See Comments)   Sulfa Antibiotics Hives      Outpatient Encounter Medications as of 09/17/2021  Medication Sig   cholecalciferol (VITAMIN D) 1000 units tablet Take 1,000 Units by mouth daily.   hydrochlorothiazide (HYDRODIURIL) 25 MG tablet Take 25 mg by mouth daily.   ibuprofen (ADVIL) 800 MG tablet Take 800 mg by mouth every 8 (eight) hours as needed.   losartan (COZAAR) 25 MG tablet Take 1 tablet (25 mg total) by mouth every other day.   metoprolol succinate (TOPROL-XL) 25 MG 24 hr tablet Take 1 tablet (25 mg total) by mouth daily.   Multiple Vitamins-Minerals (EMERGEN-C IMMUNE PLUS PO) Take 1 tablet by mouth daily.    omeprazole (PRILOSEC) 40 MG capsule Take 1 capsule (40 mg total) by mouth daily.   potassium chloride (KLOR-CON) 10 MEQ tablet Take 1 tablet (10 mEq total) by mouth daily.   tiZANidine (ZANAFLEX) 2 MG tablet as needed.   No facility-administered encounter medications on file as of 09/17/2021.   REVIEW OF SYSTEMS:  Gen: Denies fever, sweats or chills. No weight loss.  CV: Denies chest pain, palpitations or edema. Resp: Denies cough, shortness of breath of hemoptysis.  GI: See HPI. GU : + Increased urinary frequency.  MS: Denies joint pain, muscles aches or weakness. Derm: Denies rash, itchiness, skin lesions or unhealing ulcers. Psych: Denies depression, anxiety or memory loss Heme: Denies bruising, bleeding. Neuro:  Denies headaches, dizziness or paresthesias. Endo:  Denies any problems with DM, thyroid or adrenal function.  PHYSICAL EXAM: Ht 5\' 7"  (1.702 m)    Wt 197 lb (89.4 kg)    BMI 30.85 kg/m  BP 120/70    Pulse (!) 53    Ht 5\' 7"  (1.702 m)    Wt 197 lb (89.4 kg)    BMI 30.85 kg/m   General: 70 year old female in no acute distress. Head: Normocephalic and atraumatic. Eyes:  Sclerae non-icteric, conjunctive pink. Ears: Normal auditory acuity. Mouth: Upper dentures.  No ulcers or lesions.  Neck: Supple, no lymphadenopathy or thyromegaly.  Lungs: Clear bilaterally to auscultation without wheezes, crackles or rhonchi. Heart: Regular rate and rhythm. No murmur, rub or gallop appreciated.  Abdomen: Soft, non distended.  Moderate tenderness throughout the upper abdomen without rebound or guarding.  Negative CVA tenderness.  No masses. No hepatosplenomegaly. Normoactive bowel sounds x 4 quadrants.  Large RUQ scar intact. Rectal: Deferred. Musculoskeletal: Symmetrical with no gross deformities. Skin: Warm and dry. No rash or lesions on visible extremities. Extremities: No edema. Neurological: Alert oriented x 4, no focal deficits.  Psychological:  Alert and cooperative. Normal  mood and affect.  ASSESSMENT AND PLAN:  59) 70 year old female with history of GERD with upper abdominal pain, bilateral flank pain and early satiety which started after she was diagnosed with Covid 07 April 2021.  CTAP 03/2021 was unrevealing. She continues to have persistent upper abdominal and flank pain which occurs daily but has not significantly worsened since her CT was done.  No fevers.  No unintentional weight loss. -EGD benefits and risks discussed including risk with sedation, risk of bleeding, perforation and infection  -GERD/ulcer handout -Continue omeprazole 40 mg daily -Dicyclomine 10 mg 1 p.o. twice daily as needed for abdominal pain, stop if dizziness or lightheadedness occurs -CBC, CMP, urinalysis and CRP  2) History of a single cancerous colon polyp removed during a colonoscopy in 2003 (records not in [place] or [facility]).  Her most recent colonoscopy 12/2010 by Dr. Collene Mares showed sigmoid diverticulosis without evidence of colon polyps.  No known family history of colon cancer. -Colonoscopy benefits and risks discussed including risk with sedation, risk of bleeding, perforation and infection   3) Personal history of breast cancer x 2  4) History of Lupus, not on treat  5) History of rheumatoid arthritis, not on treated          CC:  Janith Lima, MD

## 2021-09-17 NOTE — Patient Instructions (Signed)
PROCEDURES: You have been scheduled for an EGD and Colonoscopy. Please follow the written instructions given to you at your visit today. Please pick up your prep supplies at the pharmacy within the next 1-3 days. If you use inhalers (even only as needed), please bring them with you on the day of your procedure.  LABS:  Lab work has been ordered for you today. Our lab is located in the basement. Press "B" on the elevator. The lab is located at the first door on the left as you exit the elevator.  HEALTHCARE LAWS AND MY CHART RESULTS: Due to recent changes in healthcare laws, you may see the results of your imaging and laboratory studies on MyChart before your provider has had a chance to review them.   We understand that in some cases there may be results that are confusing or concerning to you. Not all laboratory results come back in the same time frame and the provider may be waiting for multiple results in order to interpret others.  Please give [name] 48 hours in order for your provider to thoroughly review all the results before contacting the office for clarification of your results.   MEDICATION: We have sent the following medication to your pharmacy for you to pick up at your convenience: Dicyclomine 10 MG tablet, take 1 twice a day as needed for abdominal pain and cramping.  Follow a GERD diet. Continue Omeprazole 40 MG daily. It was great seeing you today! Thank you for entrusting me with your care and choosing [facility].  Noralyn Pick, [place] for Gastroesophageal Reflux Disease, Adult When you have gastroesophageal reflux disease (GERD), the foods you eat and your eating habits are very important. Choosing the right foods can help ease your discomfort. Think about working with a food expert (dietitian) to help you make good choices. What are tips for following this plan? Reading food labels Look for foods that are low in saturated fat. Foods that may  help with your symptoms include: Foods that have less than 5% of daily value (DV) of fat. Foods that have 0 grams of trans fat. Cooking Do not fry your food. Cook your food by baking, steaming, grilling, or broiling. These are all methods that do not need a lot of fat for cooking. To add flavor, try to use herbs that are low in spice and acidity. Meal planning  Choose healthy foods that are low in fat, such as: Fruits and vegetables. Whole grains. Low-fat dairy products. Lean meats, fish, and poultry. Eat small meals often instead of eating 3 large meals each day. Eat your meals slowly in a place where you are relaxed. Avoid bending over or lying down until 2-3 hours after eating. Limit high-fat foods such as fatty meats or fried foods. Limit your intake of fatty foods, such as oils, butter, and shortening. Avoid the following as told by your doctor: Foods that cause symptoms. These may be different for different people. Keep a food diary to keep track of foods that cause symptoms. Alcohol. Drinking a lot of liquid with meals. Eating meals during the 2-3 hours before bed. Lifestyle Stay at a healthy weight. Ask your doctor what weight is healthy for you. If you need to lose weight, work with your doctor to do so safely. Exercise for at least 30 minutes on 5 or more days each week, or as told by your doctor. Wear loose-fitting clothes. Do not smoke or use any products that contain  nicotine or tobacco. If you need help quitting, ask your doctor. Sleep with the head of your bed higher than your feet. Use a wedge under the mattress or blocks under the bed frame to raise the head of the bed. Chew sugar-free gum after meals. What foods should eat? Eat a healthy, well-balanced diet of fruits, vegetables, whole grains, low-fat dairy products, lean meats, fish, and poultry. Each person is different. Foods that may cause symptoms in one person may not cause any symptoms in another person. Work  with your doctor to find foods that are safe for you. The items listed above may not be a complete list of what you can eat and drink. Contact a food expert for more options. What foods should I avoid? Limiting some of these foods may help in managing the symptoms of GERD. Everyone is different. Talk with a food expert or your doctor to help you find the exact foods to avoid, if any. Fruits Any fruits prepared with added fat. Any fruits that cause symptoms. For some people, this may include citrus fruits, such as oranges, grapefruit, pineapple, and lemons. Vegetables Deep-fried vegetables. [place] fries. Any vegetables prepared with added fat. Any vegetables that cause symptoms. For some people, this may include tomatoes and tomato products, chili peppers, onions and garlic, and horseradish. Grains Pastries or quick breads with added fat. Meats and other proteins High-fat meats, such as fatty beef or pork, hot dogs, ribs, ham, sausage, salami, and bacon. Fried meat or protein, including fried fish and fried chicken. Nuts and nut butters, in large amounts. Dairy Whole milk and chocolate milk. Sour cream. Cream. Ice cream. Cream cheese. Milkshakes. Fats and oils Butter. Margarine. Shortening. Ghee. Beverages Coffee and tea, with or without caffeine. Carbonated beverages. Sodas. Energy drinks. Fruit juice made with acidic fruits, such as orange or grapefruit. Tomato juice. Alcoholic drinks. Sweets and desserts Chocolate and cocoa. Donuts. Seasonings and condiments Pepper. Peppermint and spearmint. Added salt. Any condiments, herbs, or seasonings that cause symptoms. For some people, this may include curry, hot sauce, or vinegar-based salad dressings. The items listed above may not be a complete list of what you should not eat and drink. Contact a food expert for more options. Questions to ask your doctor Diet and lifestyle changes are often the first steps that are taken to manage symptoms of  GERD. If diet and lifestyle changes do not help, talk with your doctor about taking medicines. Where to find more information International Foundation for Gastrointestinal Disorders: aboutgerd.org Summary When you have GERD, food and lifestyle choices are very important in easing your symptoms. Eat small meals often instead of 3 large meals a day. Eat your meals slowly and in a place where you are relaxed. Avoid bending over or lying down until 2-3 hours after eating. Limit high-fat foods such as fatty meats or fried foods. This information is not intended to replace advice given to you by your health care provider. Make sure you discuss any questions you have with your health care provider. Document Revised: 03/17/2020 Document Reviewed: 03/17/2020 Elsevier Patient Education  [place].

## 2021-09-25 ENCOUNTER — Other Ambulatory Visit: Payer: Self-pay | Admitting: Nurse Practitioner

## 2021-09-29 ENCOUNTER — Ambulatory Visit (INDEPENDENT_AMBULATORY_CARE_PROVIDER_SITE_OTHER): Payer: Medicare HMO | Admitting: Bariatrics

## 2021-09-30 ENCOUNTER — Encounter (INDEPENDENT_AMBULATORY_CARE_PROVIDER_SITE_OTHER): Payer: Self-pay | Admitting: Bariatrics

## 2021-09-30 ENCOUNTER — Ambulatory Visit (INDEPENDENT_AMBULATORY_CARE_PROVIDER_SITE_OTHER): Payer: Medicare HMO | Admitting: Bariatrics

## 2021-09-30 ENCOUNTER — Other Ambulatory Visit: Payer: Self-pay

## 2021-09-30 VITALS — BP 116/80 | HR 68 | Temp 98.1°F | Ht 67.0 in | Wt 189.0 lb

## 2021-09-30 DIAGNOSIS — Z6829 Body mass index (BMI) 29.0-29.9, adult: Secondary | ICD-10-CM

## 2021-09-30 DIAGNOSIS — I10 Essential (primary) hypertension: Secondary | ICD-10-CM | POA: Diagnosis not present

## 2021-09-30 DIAGNOSIS — E66811 Body mass index (BMI) 31.0-31.9, adult: Secondary | ICD-10-CM

## 2021-09-30 DIAGNOSIS — Z6831 Body mass index (BMI) 31.0-31.9, adult: Secondary | ICD-10-CM

## 2021-09-30 DIAGNOSIS — R7303 Prediabetes: Secondary | ICD-10-CM | POA: Diagnosis not present

## 2021-09-30 DIAGNOSIS — E669 Obesity, unspecified: Secondary | ICD-10-CM

## 2021-09-30 NOTE — Progress Notes (Signed)
Chief Complaint:   OBESITY Alonah is here to discuss her progress with her obesity treatment plan along with follow-up of her obesity related diagnoses. Shatonya is on the Category 1 Plan and states she is following her eating plan approximately 70% of the time. Peggi states she is walking 10,000 steps 2 times per week.  Today's visit was #: 7 Starting weight: 199 lbs Starting date: 06/18/2021 Today's weight: 189 lbs Today's date: 09/30/2021 Total lbs lost to date: 10 lbs Total lbs lost since last in-office visit: 2 lbs  Interim History: Teresea is down 2 lbs over the holidays. She took prednisone over the holidays.   Subjective:   1. Essential hypertension Dante is currently taking HCTZ, Cozaar and Toprol XL.  2. Pre-diabetes Keegan is not on medications currently.  Assessment/Plan:   1. Essential hypertension Sun will continue medications. She is working on healthy weight loss and exercise to improve blood pressure control. We will watch for signs of hypotension as she continues her lifestyle modifications.  2. Pre-diabetes Franceska will continue to work on weight loss, exercise, and decreasing simple carbohydrates to help decrease the risk of diabetes. She will increase healthy fats and protein.   3. Obesity with current BMI of 29.7 Lashan is currently in the action stage of change. As such, her goal is to continue with weight loss efforts. She has agreed to the Category 1 Plan.   Caliann will continue meal planning and she will continue intentional eating. She will increase water and protein intake.  Exercise goals:  As is.  Behavioral modification strategies: increasing lean protein intake, decreasing simple carbohydrates, increasing vegetables, increasing water intake, decreasing eating out, no skipping meals, meal planning and cooking strategies, keeping healthy foods in the home, and planning for success.  Minta has agreed to follow-up with our clinic  in 3 weeks. She was informed of the importance of frequent follow-up visits to maximize her success with intensive lifestyle modifications for her multiple health conditions.   Objective:   Blood pressure 116/80, pulse 68, temperature 98.1 F (36.7 C), height 5\' 7"  (1.702 m), weight 189 lb (85.7 kg), SpO2 98 %. Body mass index is 29.6 kg/m.  General: Cooperative, alert, well developed, in no acute distress. HEENT: Conjunctivae and lids unremarkable. Cardiovascular: Regular rhythm.  Lungs: Normal work of breathing. Neurologic: No focal deficits.   Lab Results  Component Value Date   CREATININE 0.70 09/17/2021   BUN 14 09/17/2021   NA 137 09/17/2021   K 3.6 09/17/2021   CL 102 09/17/2021   CO2 28 09/17/2021   Lab Results  Component Value Date   ALT 20 09/17/2021   AST 27 09/17/2021   ALKPHOS 90 09/17/2021   BILITOT 0.4 09/17/2021   Lab Results  Component Value Date   HGBA1C 5.7 (H) 06/29/2021   HGBA1C 5.7 12/30/2020   HGBA1C 5.6 04/29/2020   HGBA1C 5.5 10/05/2014   Lab Results  Component Value Date   INSULIN 16.3 06/29/2021   Lab Results  Component Value Date   TSH 1.610 06/29/2021   Lab Results  Component Value Date   CHOL 153 06/29/2021   HDL 57 06/29/2021   LDLCALC 80 06/29/2021   TRIG 83 06/29/2021   CHOLHDL 3 12/30/2020   Lab Results  Component Value Date   VD25OH 45.6 06/29/2021   VD25OH 27 (L) 12/05/2008   VD25OH 33 04/24/2008   Lab Results  Component Value Date   WBC 4.7 09/17/2021   HGB 12.5 09/17/2021  HCT 38.0 09/17/2021   MCV 94.4 09/17/2021   PLT 181.0 09/17/2021   Lab Results  Component Value Date   IRON 156 (H) 06/11/2021   TIBC 389 06/11/2021   FERRITIN 18 06/11/2021   Attestation Statements:   Reviewed by clinician on day of visit: allergies, medications, problem list, medical history, surgical history, family history, social history, and previous encounter notes.  I, Lizbeth Bark, RMA, am acting as Location manager for  [employer], DO.  I have reviewed the above documentation for accuracy and completeness, and I agree with the above. Jearld Lesch, DO

## 2021-10-04 ENCOUNTER — Encounter (INDEPENDENT_AMBULATORY_CARE_PROVIDER_SITE_OTHER): Payer: Self-pay | Admitting: Bariatrics

## 2021-10-04 ENCOUNTER — Other Ambulatory Visit: Payer: Self-pay | Admitting: Nurse Practitioner

## 2021-10-05 ENCOUNTER — Telehealth (HOSPITAL_BASED_OUTPATIENT_CLINIC_OR_DEPARTMENT_OTHER): Payer: Self-pay | Admitting: Cardiovascular Disease

## 2021-10-05 DIAGNOSIS — I10 Essential (primary) hypertension: Secondary | ICD-10-CM

## 2021-10-05 DIAGNOSIS — M25562 Pain in left knee: Secondary | ICD-10-CM | POA: Diagnosis not present

## 2021-10-05 DIAGNOSIS — R002 Palpitations: Secondary | ICD-10-CM

## 2021-10-05 MED ORDER — LOSARTAN POTASSIUM 25 MG PO TABS: 25.0000 mg | ORAL_TABLET | ORAL | 0 refills | Status: DC

## 2021-10-05 MED ORDER — HYDROCHLOROTHIAZIDE 25 MG PO TABS: 25.0000 mg | ORAL_TABLET | Freq: Every day | ORAL | 0 refills | Status: DC

## 2021-10-05 MED ORDER — METOPROLOL SUCCINATE ER 25 MG PO TB24: 25.0000 mg | ORAL_TABLET | Freq: Every day | ORAL | 0 refills | Status: DC

## 2021-10-05 NOTE — Telephone Encounter (Signed)
°*  STAT* If patient is at the pharmacy, call can be transferred to refill team.   1. Which medications need to be refilled? (please list name of each medication and dose if known)  losartan (COZAAR) 25 MG tablet hydrochlorothiazide (HYDRODIURIL) 25 MG tablet metoprolol succinate (TOPROL-XL) 25 MG 24 hr tablet  2. Which pharmacy/location (including street and city if local pharmacy) is medication to be sent to? [id number] - [place], [place] - [address] AT [place]  3. Do they need a 30 day or 90 day supply? 90 with refills

## 2021-10-06 DIAGNOSIS — E669 Obesity, unspecified: Secondary | ICD-10-CM | POA: Diagnosis not present

## 2021-10-06 DIAGNOSIS — M329 Systemic lupus erythematosus, unspecified: Secondary | ICD-10-CM | POA: Diagnosis not present

## 2021-10-06 DIAGNOSIS — Z6830 Body mass index (BMI) 30.0-30.9, adult: Secondary | ICD-10-CM | POA: Diagnosis not present

## 2021-10-06 DIAGNOSIS — M150 Primary generalized (osteo)arthritis: Secondary | ICD-10-CM | POA: Diagnosis not present

## 2021-10-06 DIAGNOSIS — M2550 Pain in unspecified joint: Secondary | ICD-10-CM | POA: Diagnosis not present

## 2021-10-07 DIAGNOSIS — Z96652 Presence of left artificial knee joint: Secondary | ICD-10-CM | POA: Diagnosis not present

## 2021-10-07 DIAGNOSIS — M25562 Pain in left knee: Secondary | ICD-10-CM | POA: Diagnosis not present

## 2021-10-13 DIAGNOSIS — M25462 Effusion, left knee: Secondary | ICD-10-CM | POA: Diagnosis not present

## 2021-10-13 DIAGNOSIS — Z96652 Presence of left artificial knee joint: Secondary | ICD-10-CM | POA: Diagnosis not present

## 2021-10-20 ENCOUNTER — Ambulatory Visit (INDEPENDENT_AMBULATORY_CARE_PROVIDER_SITE_OTHER): Payer: Medicare HMO | Admitting: Bariatrics

## 2021-10-22 ENCOUNTER — Encounter (INDEPENDENT_AMBULATORY_CARE_PROVIDER_SITE_OTHER): Payer: Self-pay

## 2021-10-22 ENCOUNTER — Ambulatory Visit (INDEPENDENT_AMBULATORY_CARE_PROVIDER_SITE_OTHER): Payer: Medicare HMO | Admitting: Bariatrics

## 2021-10-27 DIAGNOSIS — M25462 Effusion, left knee: Secondary | ICD-10-CM | POA: Diagnosis not present

## 2021-11-04 ENCOUNTER — Encounter: Payer: Self-pay | Admitting: Gastroenterology

## 2021-11-10 ENCOUNTER — Encounter: Payer: Self-pay | Admitting: Gastroenterology

## 2021-11-10 ENCOUNTER — Ambulatory Visit (AMBULATORY_SURGERY_CENTER): Payer: Medicare HMO | Admitting: Gastroenterology

## 2021-11-10 VITALS — BP 144/75 | HR 64 | Temp 97.1°F | Resp 16 | Ht 67.0 in | Wt 197.0 lb

## 2021-11-10 DIAGNOSIS — R1010 Upper abdominal pain, unspecified: Secondary | ICD-10-CM

## 2021-11-10 DIAGNOSIS — K219 Gastro-esophageal reflux disease without esophagitis: Secondary | ICD-10-CM

## 2021-11-10 DIAGNOSIS — D123 Benign neoplasm of transverse colon: Secondary | ICD-10-CM

## 2021-11-10 DIAGNOSIS — Z85038 Personal history of other malignant neoplasm of large intestine: Secondary | ICD-10-CM

## 2021-11-10 DIAGNOSIS — K449 Diaphragmatic hernia without obstruction or gangrene: Secondary | ICD-10-CM

## 2021-11-10 DIAGNOSIS — D124 Benign neoplasm of descending colon: Secondary | ICD-10-CM | POA: Diagnosis not present

## 2021-11-10 MED ORDER — SODIUM CHLORIDE 0.9 % IV SOLN: 500.0000 mL | Freq: Once | INTRAVENOUS | Status: DC

## 2021-11-10 NOTE — Patient Instructions (Signed)
YOU HAD AN ENDOSCOPIC PROCEDURE TODAY AT THE [facility]:   Refer to the procedure report that was given to you for any specific questions about what was found during the examination.  If the procedure report does not answer your questions, please call your gastroenterologist to clarify.  If you requested that your care partner not be given the details of your procedure findings, then the procedure report has been included in a sealed envelope for you to review at your convenience later.  YOU SHOULD EXPECT: Some feelings of bloating in the abdomen. Passage of more gas than usual.  Walking can help get rid of the air that was put into your GI tract during the procedure and reduce the bloating. If you had a lower endoscopy (such as a colonoscopy or flexible sigmoidoscopy) you may notice spotting of blood in your stool or on the toilet paper. If you underwent a bowel prep for your procedure, you may not have a normal bowel movement for a few days.  Please Note:  You might notice some irritation and congestion in your nose or some drainage.  This is from the oxygen used during your procedure.  There is no need for concern and it should clear up in a day or so.  SYMPTOMS TO REPORT IMMEDIATELY:   Following lower endoscopy (colonoscopy or flexible sigmoidoscopy):  Excessive amounts of blood in the stool  Significant tenderness or worsening of abdominal pains  Swelling of the abdomen that is new, acute  Fever of 100F or higher   Following upper endoscopy (EGD)  Vomiting of blood or coffee ground material  New chest pain or pain under the shoulder blades  Painful or persistently difficult swallowing  New shortness of breath  Fever of 100F or higher  Black, tarry-looking stools  For urgent or emergent issues, a gastroenterologist can be reached at any hour by calling [phone]. Do not use MyChart messaging for urgent concerns.    DIET:  We do recommend a small meal at first, but  then you may proceed to your regular diet.  Drink plenty of fluids but you should avoid alcoholic beverages for 24 hours.  ACTIVITY:  You should plan to take it easy for the rest of today and you should NOT DRIVE or use heavy machinery until tomorrow (because of the sedation medicines used during the test).    FOLLOW UP: Our staff will call the number listed on your records 48-72 hours following your procedure to check on you and address any questions or concerns that you may have regarding the information given to you following your procedure. If we do not reach you, we will leave a message.  We will attempt to reach you two times.  During this call, we will ask if you have developed any symptoms of COVID 19. If you develop any symptoms (ie: fever, flu-like symptoms, shortness of breath, cough etc.) before then, please call [phone].  If you test positive for Covid 19 in the 2 weeks post procedure, please call and report this information to us.    If any biopsies were taken you will be contacted by phone or by letter within the next 1-3 weeks.  Please call us at [phone] if you have not heard about the biopsies in 3 weeks.    SIGNATURES/CONFIDENTIALITY: You and/or your care partner have signed paperwork which will be entered into your electronic medical record.  These signatures attest to the fact that that the information above on   your After Visit Summary has been reviewed and is understood.  Full responsibility of the confidentiality of this discharge information lies with you and/or your care-partner. 

## 2021-11-10 NOTE — Op Note (Signed)
[place] Patient Name: [name] Procedure Date: 11/10/2021 10:09 AM MRN: [id number] Endoscopist: Ladene Artist , MD Age: 71 Referring MD:  Date of Birth: 11/21/1950 Gender: Female Account #: [id number] Procedure:                Colonoscopy Indications:              High risk colon cancer surveillance: Personal                            history of colon cancer arrising in a colon polyp                            in 2003. Medicines:                Monitored Anesthesia Care Procedure:                Pre-Anesthesia Assessment:                           - Prior to the procedure, a History and Physical                            was performed, and patient medications and                            allergies were reviewed. The patient's tolerance of                            previous anesthesia was also reviewed. The risks                            and benefits of the procedure and the sedation                            options and risks were discussed with the patient.                            All questions were answered, and informed consent                            was obtained. Prior Anticoagulants: The patient has                            taken no previous anticoagulant or antiplatelet                            agents. ASA Grade Assessment: III - A patient with                            severe systemic disease. After reviewing the risks                            and benefits, the patient was deemed in  satisfactory condition to undergo the procedure.                           After obtaining informed consent, the colonoscope                            was passed under direct vision. Throughout the                            procedure, the patient's blood pressure, pulse, and                            oxygen saturations were monitored continuously. The                            Olympus CF-HQ190L (Serial# [id number]) Colonoscope was                             introduced through the anus and advanced to the the                            cecum, identified by appendiceal orifice and                            ileocecal valve. The ileocecal valve, appendiceal                            orifice, and rectum were photographed. The quality                            of the bowel preparation was good after extensive                            lavage and suction. The colonoscopy was performed                            without difficulty. The patient tolerated the                            procedure well. Scope In: 10:15:50 AM Scope Out: 10:32:47 AM Scope Withdrawal Time: 0 hours 14 minutes 3 seconds  Total Procedure Duration: 0 hours 16 minutes 57 seconds  Findings:                 The perianal and digital rectal examinations were                            normal.                           Two sessile polyps were found in the proximal                            transverse colon. The polyps were 3 to 4 mm in  size. These polyps were removed with a cold biopsy                            forceps. Resection and retrieval were complete.                           Two sessile polyps were found in the descending                            colon and transverse colon. The polyps were 7 to 8                            mm in size. These polyps were removed with a cold                            snare. Resection and retrieval were complete.                           Multiple medium-mouthed diverticula were found in                            the left colon. There was no evidence of                            diverticular bleeding.                           Internal hemorrhoids were found during                            retroflexion. The hemorrhoids were small and Grade                            I (internal hemorrhoids that do not prolapse).                           The exam was otherwise without  abnormality on                            direct and retroflexion views. Complications:            No immediate complications. Estimated blood loss:                            None. Estimated Blood Loss:     Estimated blood loss: none. Impression:               - Two 3 to 4 mm polyps in the proximal transverse                            colon, removed with a cold biopsy forceps. Resected                            and retrieved.                           -  Two 7 to 8 mm polyps in the descending colon and                            in the transverse colon, removed with a cold snare.                            Resected and retrieved.                           - Mild diverticulosis in the left colon.                           - Internal hemorrhoids.                           - The examination was otherwise normal on direct                            and retroflexion views. Recommendation:           - Repeat colonoscopy after studies are complete for                            surveillance based on pathology results.                           - Patient has a contact number available for                            emergencies. The signs and symptoms of potential                            delayed complications were discussed with the                            patient. Return to normal activities tomorrow.                            Written discharge instructions were provided to the                            patient.                           - High fiber diet.                           - Continue present medications.                           - Await pathology results. Ladene Artist, MD 11/10/2021 10:36:49 AM This report has been signed electronically.

## 2021-11-10 NOTE — Op Note (Signed)
[facility] Patient Name: [name] Procedure Date: 11/10/2021 10:09 AM MRN: [id number] Endoscopist: Ladene Artist , MD Age: 71 Referring MD:  Date of Birth: [birth year] Gender: Female Account #: [id number] Procedure:                Upper GI endoscopy Indications:              Upper abdominal pain, Gastroesophageal reflux                            disease Medicines:                Monitored Anesthesia Care Procedure:                Pre-Anesthesia Assessment:                           - Prior to the procedure, a History and Physical                            was performed, and patient medications and                            allergies were reviewed. The patient's tolerance of                            previous anesthesia was also reviewed. The risks                            and benefits of the procedure and the sedation                            options and risks were discussed with the patient.                            All questions were answered, and informed consent                            was obtained. Prior Anticoagulants: The patient has                            taken no previous anticoagulant or antiplatelet                            agents. ASA Grade Assessment: III - A patient with                            severe systemic disease. After reviewing the risks                            and benefits, the patient was deemed in                            satisfactory condition to undergo the procedure.  After obtaining informed consent, the endoscope was                            passed under direct vision. Throughout the                            procedure, the patient's blood pressure, pulse, and                            oxygen saturations were monitored continuously. The                            Endoscope was introduced through the mouth, and                            advanced to the second part of duodenum. The  upper                            GI endoscopy was accomplished without difficulty.                            The patient tolerated the procedure well. Scope In: Scope Out: Findings:                 The examined esophagus was normal.                           Two localized small erosions with no bleeding and                            no stigmata of recent bleeding were found on the                            greater curvature of the stomach. Biopsies were                            taken with a cold forceps for histology.                           A small hiatal hernia was present.                           The exam of the stomach was otherwise normal.                           The duodenal bulb and second portion of the                            duodenum were normal. Complications:            No immediate complications. Estimated blood loss:                            None. Estimated Blood Loss:     Estimated blood loss was minimal. Impression:               -  Normal esophagus.                           - Erosive gastropathy with no bleeding and no                            stigmata of recent bleeding. Biopsied.                           - Small hiatal hernia.                           - Normal duodenal bulb and second portion of the                            duodenum. Recommendation:           - Patient has a contact number available for                            emergencies. The signs and symptoms of potential                            delayed complications were discussed with the                            patient. Return to normal activities tomorrow.                            Written discharge instructions were provided to the                            patient.                           - Resume previous diet.                           - Follow antireflux measures.                           - Continue present medications.                           - Await pathology  results. Ladene Artist, MD 11/10/2021 10:54:05 AM This report has been signed electronically.

## 2021-11-10 NOTE — Progress Notes (Signed)
History & Physical  Primary Care Physician:  Janith Lima, MD Primary Gastroenterologist: Lucio Edward, MD  CHIEF COMPLAINT:  Personal history of colon polyps with cancer, upper abdominal pain, GERD.  HPI: [name] is a 71 y.o. female with a personal history of a colon polyp with cancer in 2003.  She has upper abdominal pain and GERD.  For colonoscopy and EGD today.   Past Medical History:  Diagnosis Date   Arthritis    R knee, hands    Back pain    Blood transfusion    1986- post-partial hysterectomy   Breast cancer ([place]) 2009   right lumpectomy and radiation   Cataract    Chicken pox    Colon cancer ([place]) 2003   per pt report, cancer found in a colon polyp in 2003, procedure done in [place].  did not require surgery, chemo etc.     Depression    Diverticulitis    Edema of both ankles    Family history of breast cancer 10/24/2017   Gallbladder problem    Gastritis    GERD (gastroesophageal reflux disease)    recently taken off anti reflux tx   Hepatitis    Hep. A- 1978   Hiatal hernia    History of stomach ulcers    Hypertension    Joint pain    Lactose intolerance    Lupus ([place])    Multiple gastric ulcers    Palpitations    Personal history of radiation therapy 2009   Rheumatic fever    Rheumatoid arthritis ([place])    SLE (systemic lupus erythematosus related syndrome) ([place])    Urinary incontinence    UTI (urinary tract infection)     Past Surgical History:  Procedure Laterality Date   [place] & tubal ligation    BIOPSY  07/25/2018   Procedure: BIOPSY;  Surgeon: Milus Banister, MD;  Location: [facility];  Service: Endoscopy;;   BREAST EXCISIONAL BIOPSY Left 1998   BREAST LUMPECTOMY Bilateral    2009   BREAST SURGERY     R breast lumpectomy   CHOLECYSTECTOMY     1978- open    ESOPHAGOGASTRODUODENOSCOPY (EGD) WITH PROPOFOL N/A 07/25/2018   Procedure: ESOPHAGOGASTRODUODENOSCOPY (EGD) WITH  PROPOFOL;  Surgeon: Milus Banister, MD;  Location: [facility];  Service: Endoscopy;  Laterality: N/A;   REPLACEMENT TOTAL KNEE Left    TONSILLECTOMY     1954   TOTAL KNEE ARTHROPLASTY  08/30/2011   Procedure: TOTAL KNEE ARTHROPLASTY;  Surgeon: Kerin Salen;  Location: [facility];  Service: Orthopedics;  Laterality: Right;  Right Total Knee Arthroplasty   TUBAL LIGATION      Prior to Admission medications   Medication Sig Start Date End Date Taking? Authorizing Provider  hydrochlorothiazide (HYDRODIURIL) 25 MG tablet Take 1 tablet (25 mg total) by mouth daily. Must keep upcoming appointment for further refills 10/05/21  Yes Skeet Latch, MD  losartan (COZAAR) 25 MG tablet Take 1 tablet (25 mg total) by mouth every other day. Must keep upcoming appointment for further refills 10/05/21  Yes Skeet Latch, MD  metoprolol succinate (TOPROL-XL) 25 MG 24 hr tablet Take 1 tablet (25 mg total) by mouth daily. Must keep upcoming appointment for further refills 10/05/21  Yes Skeet Latch, MD  Multiple Vitamins-Minerals (EMERGEN-C IMMUNE PLUS PO) Take 1 tablet by mouth daily.   Yes [provider]  omeprazole (PRILOSEC) 40 MG  capsule Take 1 capsule (40 mg total) by mouth daily. 02/17/18  Yes Magrinat, Virgie Dad, MD  cholecalciferol (VITAMIN D) 1000 units tablet Take 1,000 Units by mouth daily.    [provider]  dicyclomine (BENTYL) 10 MG capsule TAKE 1 CAPSULE (10 MG TOTAL) BY MOUTH 2 (TWO) TIMES DAILY AS NEEDED FOR SPASMS (ABDOMINAL PAIN). 09/25/21   Noralyn Pick, NP  ibuprofen (ADVIL) 800 MG tablet Take 800 mg by mouth every 8 (eight) hours as needed.    [provider]  potassium chloride (KLOR-CON) 10 MEQ tablet Take 1 tablet (10 mEq total) by mouth daily. 07/22/21   Hoyt Koch, MD  tiZANidine (ZANAFLEX) 2 MG tablet as needed.    [provider]    Current Outpatient Medications  Medication Sig Dispense Refill   hydrochlorothiazide  (HYDRODIURIL) 25 MG tablet Take 1 tablet (25 mg total) by mouth daily. Must keep upcoming appointment for further refills 90 tablet 0   losartan (COZAAR) 25 MG tablet Take 1 tablet (25 mg total) by mouth every other day. Must keep upcoming appointment for further refills 45 tablet 0   metoprolol succinate (TOPROL-XL) 25 MG 24 hr tablet Take 1 tablet (25 mg total) by mouth daily. Must keep upcoming appointment for further refills 90 tablet 0   Multiple Vitamins-Minerals (EMERGEN-C IMMUNE PLUS PO) Take 1 tablet by mouth daily.     omeprazole (PRILOSEC) 40 MG capsule Take 1 capsule (40 mg total) by mouth daily. 30 capsule 5   cholecalciferol (VITAMIN D) 1000 units tablet Take 1,000 Units by mouth daily.     dicyclomine (BENTYL) 10 MG capsule TAKE 1 CAPSULE (10 MG TOTAL) BY MOUTH 2 (TWO) TIMES DAILY AS NEEDED FOR SPASMS (ABDOMINAL PAIN). 30 capsule 0   ibuprofen (ADVIL) 800 MG tablet Take 800 mg by mouth every 8 (eight) hours as needed.     potassium chloride (KLOR-CON) 10 MEQ tablet Take 1 tablet (10 mEq total) by mouth daily. 90 tablet 1   tiZANidine (ZANAFLEX) 2 MG tablet as needed.     Current Facility-Administered Medications  Medication Dose Route Frequency Provider Last Rate Last Admin   0.9 %  sodium chloride infusion  500 mL Intravenous Once Ladene Artist, MD        Allergies as of 11/10/2021 - Review Complete 11/10/2021  Allergen Reaction Noted   Iodine Anaphylaxis 11/03/2007   Iohexol Anaphylaxis 11/30/2007   Latex Anaphylaxis 11/03/2007   Penicillins Anaphylaxis and Rash 11/03/2007   Tetracycline Anaphylaxis 11/03/2007   Other Other (See Comments) 12/27/2010   Penicillin g Other (See Comments) 12/27/2010   Sulfa antibiotics Hives 03/24/2016    Family History  Problem Relation Age of Onset   Breast cancer Mother 65   Hypertension Mother    Stroke Mother    Depression Mother    Heart attack Father    Other Father    Heart disease Father    Sudden death Father     Hypertension Sister    Hypertension Brother    Kidney disease Brother    Other Maternal Grandmother 61       brain tumor dx at 13- did not bx or do additional testing on tumor   Arthritis Maternal Grandmother    Cancer Paternal Grandmother 68       type unk   Breast cancer Maternal Aunt 65   Cancer Paternal Aunt 65       type unk   Cancer Other    Hypertension Other  Anesthesia problems Neg Hx    Hypotension Neg Hx    Malignant hyperthermia Neg Hx    Pseudochol deficiency Neg Hx    Colon cancer Neg Hx    Esophageal cancer Neg Hx    Stomach cancer Neg Hx    Pancreatic cancer Neg Hx    Liver disease Neg Hx     Social History   Socioeconomic History   Marital status: Single    Spouse name: Not on file   Number of children: 4   Years of education: Not on file   Highest education level: Not on file  Occupational History   Occupation: Retired Marine scientist  Tobacco Use   Smoking status: Never   Smokeless tobacco: Never  Vaping Use   Vaping Use: Never used  Substance and Sexual Activity   Alcohol use: No   Drug use: No   Sexual activity: Yes  Other Topics Concern   Not on file  Social History Narrative   ** Merged History Encounter **       Social Determinants of Health   Financial Resource Strain: Not on file  Food Insecurity: Not on file  Transportation Needs: Not on file  Physical Activity: Not on file  Stress: Not on file  Social Connections: Not on file  Intimate Partner Violence: Not on file    Review of Systems:  All systems reviewed an negative except where noted in HPI.  Gen: Denies any fever, chills, sweats, anorexia, fatigue, weakness, malaise, weight loss, and sleep disorder CV: Denies chest pain, angina, palpitations, syncope, orthopnea, PND, peripheral edema, and claudication. Resp: Denies dyspnea at rest, dyspnea with exercise, cough, sputum, wheezing, coughing up blood, and pleurisy. GI: Denies vomiting blood, jaundice, and fecal incontinence.    Denies dysphagia or odynophagia. GU : Denies urinary burning, blood in urine, urinary frequency, urinary hesitancy, nocturnal urination, and urinary incontinence. MS: Denies joint pain, limitation of movement, and swelling, stiffness, low back pain, extremity pain. Denies muscle weakness, cramps, atrophy.  Derm: Denies rash, itching, dry skin, hives, moles, warts, or unhealing ulcers.  Psych: Denies depression, anxiety, memory loss, suicidal ideation, hallucinations, paranoia, and confusion. Heme: Denies bruising, bleeding, and enlarged lymph nodes. Neuro:  Denies any headaches, dizziness, paresthesias. Endo:  Denies any problems with DM, thyroid, adrenal function.   Physical Exam: General:  Alert, well-developed, in NAD Head:  Normocephalic and atraumatic. Eyes:  Sclera clear, no icterus.   Conjunctiva pink. Ears:  Normal auditory acuity. Mouth:  No deformity or lesions.  Neck:  Supple; no masses . Lungs:  Clear throughout to auscultation.   No wheezes, crackles, or rhonchi. No acute distress. Heart:  Regular rate and rhythm; no murmurs. Abdomen:  Soft, nondistended, nontender. No masses, hepatomegaly. No obvious masses.  Normal bowel .    Rectal:  Deferred   Msk:  Symmetrical without gross deformities.. Pulses:  Normal pulses noted. Extremities:  Without edema. Neurologic:  Alert and  oriented x4;  grossly normal neurologically. Skin:  Intact without significant lesions or rashes. Cervical Nodes:  No significant cervical adenopathy. Psych:  Alert and cooperative. Normal mood and affect.   Impression / Plan:   Personal history of a colon polyp with cancer in 2003.  Upper abdominal pain and GERD.  For colonoscopy and EGD today.  Pricilla Riffle. Fuller Plan  11/10/2021, 10:11 AM See Shea Evans, [facility], to contact our on call provider

## 2021-11-10 NOTE — Progress Notes (Signed)
VSS, transported to PACU °

## 2021-11-10 NOTE — Progress Notes (Signed)
Called to room to assist during endoscopic procedure.  Patient ID and intended procedure confirmed with present staff. Received instructions for my participation in the procedure from the performing physician.  

## 2021-11-12 ENCOUNTER — Telehealth: Payer: Self-pay | Admitting: *Deleted

## 2021-11-12 NOTE — Telephone Encounter (Signed)
First follow up call attempt.  LVM. 

## 2021-11-12 NOTE — Telephone Encounter (Signed)
No answer on second attempt follow up call.

## 2021-11-16 ENCOUNTER — Ambulatory Visit (INDEPENDENT_AMBULATORY_CARE_PROVIDER_SITE_OTHER): Payer: Medicare HMO | Admitting: Bariatrics

## 2021-11-16 ENCOUNTER — Other Ambulatory Visit: Payer: Self-pay

## 2021-11-16 ENCOUNTER — Encounter (INDEPENDENT_AMBULATORY_CARE_PROVIDER_SITE_OTHER): Payer: Self-pay | Admitting: Bariatrics

## 2021-11-16 VITALS — BP 141/78 | HR 62 | Temp 98.0°F | Ht 67.0 in | Wt 186.0 lb

## 2021-11-16 DIAGNOSIS — M069 Rheumatoid arthritis, unspecified: Secondary | ICD-10-CM | POA: Diagnosis not present

## 2021-11-16 DIAGNOSIS — E669 Obesity, unspecified: Secondary | ICD-10-CM | POA: Diagnosis not present

## 2021-11-16 DIAGNOSIS — Z6829 Body mass index (BMI) 29.0-29.9, adult: Secondary | ICD-10-CM

## 2021-11-16 DIAGNOSIS — Z6831 Body mass index (BMI) 31.0-31.9, adult: Secondary | ICD-10-CM

## 2021-11-16 DIAGNOSIS — R7303 Prediabetes: Secondary | ICD-10-CM | POA: Diagnosis not present

## 2021-11-16 DIAGNOSIS — E66811 Obesity, class 1: Secondary | ICD-10-CM

## 2021-11-16 NOTE — Progress Notes (Signed)
Chief Complaint:   OBESITY [name] is here to discuss her progress with her obesity treatment plan along with follow-up of her obesity related diagnoses. [name] is on the Category 2 Plan and states she is following her eating plan approximately 0% of the time. [name] states she is walking for 30 minutes 3-4 times per week.  Today's visit was #: 8 Starting weight: 199 lbs Starting date: 06/18/2021 Today's weight: 186 lbs Today's date: 11/16/2021 Total lbs lost to date: 13 lbs Total lbs lost since last in-office visit: 3 lbs  Interim History: [name] is down an additional 3 lbs. She had to go back on the Prednisone but has tapered off the Prednisone.   Subjective:   1. Pre-diabetes [name] is currently not on medications.   2. Rheumatoid arthritis, involving unspecified site, unspecified whether rheumatoid factor present ([place]) [name] is not on medication currently.   Assessment/Plan:   1. Pre-diabetes [name] will avoid sweets and limit saturated fats. She will continue to work on weight loss, exercise, and decreasing simple carbohydrates to help decrease the risk of diabetes.   2. Rheumatoid arthritis, involving unspecified site, unspecified whether rheumatoid factor present ([name]) [name] will follow up with her primary care physician and specialist. She will be as active as tolerated.   3. Obesity with current BMI of 29.1 [name] is currently in the action stage of change. As such, her goal is to continue with weight loss efforts. She has agreed to the Category 2 Plan and keeping a food journal and adhering to recommended goals of 1200 calories and 80 grams of protein.   [name] will continue meal planning and she will adhere closely to the plan 80-90%.  Exercise goals:  [name] will resume going to the [facility] for exercise.   Behavioral modification strategies: increasing lean protein intake, decreasing simple carbohydrates, increasing vegetables, increasing water intake,  decreasing eating out, no skipping meals, meal planning and cooking strategies, keeping healthy foods in the home, and planning for success.  [name] has agreed to follow-up with our clinic in 3 weeks. She was informed of the importance of frequent follow-up visits to maximize her success with intensive lifestyle modifications for her multiple health conditions.   Objective:   Blood pressure (!) 141/78, pulse 62, temperature 98 F (36.7 C), height 5\' 7"  (1.702 m), weight 186 lb (84.4 kg), SpO2 97 %. Body mass index is 29.13 kg/m.  General: Cooperative, alert, well developed, in no acute distress. HEENT: Conjunctivae and lids unremarkable. Cardiovascular: Regular rhythm.  Lungs: Normal work of breathing. Neurologic: No focal deficits.   Lab Results  Component Value Date   CREATININE 0.70 09/17/2021   BUN 14 09/17/2021   NA 137 09/17/2021   K 3.6 09/17/2021   CL 102 09/17/2021   CO2 28 09/17/2021   Lab Results  Component Value Date   ALT 20 09/17/2021   AST 27 09/17/2021   ALKPHOS 90 09/17/2021   BILITOT 0.4 09/17/2021   Lab Results  Component Value Date   HGBA1C 5.7 (H) 06/29/2021   HGBA1C 5.7 12/30/2020   HGBA1C 5.6 04/29/2020   HGBA1C 5.5 10/05/2014   Lab Results  Component Value Date   INSULIN 16.3 06/29/2021   Lab Results  Component Value Date   TSH 1.610 06/29/2021   Lab Results  Component Value Date   CHOL 153 06/29/2021   HDL 57 06/29/2021   LDLCALC 80 06/29/2021   TRIG 83 06/29/2021   CHOLHDL 3 12/30/2020   Lab Results  Component Value Date   VD25OH 45.6 06/29/2021   VD25OH 27 (L) 12/05/2008   VD25OH 33 04/24/2008   Lab Results  Component Value Date   WBC 4.7 09/17/2021   HGB 12.5 09/17/2021   HCT 38.0 09/17/2021   MCV 94.4 09/17/2021   PLT 181.0 09/17/2021   Lab Results  Component Value Date   IRON 156 (H) 06/11/2021   TIBC 389 06/11/2021   FERRITIN 18 06/11/2021   Attestation Statements:   Reviewed by clinician on day of visit:  allergies, medications, problem list, medical history, surgical history, family history, social history, and previous encounter notes.  I, Lizbeth Bark, RMA, am acting as Location manager for [employer], DO.  I have reviewed the above documentation for accuracy and completeness, and I agree with the above. Jearld Lesch, DO

## 2021-11-18 ENCOUNTER — Encounter: Payer: Self-pay | Admitting: Gastroenterology

## 2021-11-23 DIAGNOSIS — E1165 Type 2 diabetes mellitus with hyperglycemia: Secondary | ICD-10-CM | POA: Diagnosis not present

## 2021-11-23 DIAGNOSIS — M329 Systemic lupus erythematosus, unspecified: Secondary | ICD-10-CM | POA: Diagnosis not present

## 2021-11-23 DIAGNOSIS — R5383 Other fatigue: Secondary | ICD-10-CM | POA: Diagnosis not present

## 2021-11-23 DIAGNOSIS — Z1231 Encounter for screening mammogram for malignant neoplasm of breast: Secondary | ICD-10-CM | POA: Diagnosis not present

## 2021-11-23 DIAGNOSIS — Z6829 Body mass index (BMI) 29.0-29.9, adult: Secondary | ICD-10-CM | POA: Diagnosis not present

## 2021-11-23 DIAGNOSIS — Z0130 Encounter for examination of blood pressure without abnormal findings: Secondary | ICD-10-CM | POA: Diagnosis not present

## 2021-11-23 DIAGNOSIS — Z0000 Encounter for general adult medical examination without abnormal findings: Secondary | ICD-10-CM | POA: Diagnosis not present

## 2021-11-23 DIAGNOSIS — Z1159 Encounter for screening for other viral diseases: Secondary | ICD-10-CM | POA: Diagnosis not present

## 2021-11-27 ENCOUNTER — Other Ambulatory Visit: Payer: Self-pay | Admitting: Adult Medicine

## 2021-11-27 DIAGNOSIS — Z1231 Encounter for screening mammogram for malignant neoplasm of breast: Secondary | ICD-10-CM

## 2021-12-10 ENCOUNTER — Ambulatory Visit: Payer: Medicare HMO | Admitting: Hematology and Oncology

## 2021-12-10 ENCOUNTER — Other Ambulatory Visit: Payer: Medicare HMO

## 2021-12-14 ENCOUNTER — Encounter (INDEPENDENT_AMBULATORY_CARE_PROVIDER_SITE_OTHER): Payer: Self-pay

## 2021-12-14 ENCOUNTER — Other Ambulatory Visit: Payer: Self-pay | Admitting: *Deleted

## 2021-12-14 ENCOUNTER — Ambulatory Visit (INDEPENDENT_AMBULATORY_CARE_PROVIDER_SITE_OTHER): Payer: Medicare HMO | Admitting: Bariatrics

## 2021-12-14 DIAGNOSIS — Z170 Estrogen receptor positive status [ER+]: Secondary | ICD-10-CM

## 2021-12-15 ENCOUNTER — Inpatient Hospital Stay: Payer: Medicare HMO

## 2021-12-15 ENCOUNTER — Inpatient Hospital Stay: Payer: Medicare HMO | Admitting: Hematology and Oncology

## 2021-12-28 ENCOUNTER — Other Ambulatory Visit: Payer: Self-pay

## 2021-12-28 ENCOUNTER — Other Ambulatory Visit: Payer: Self-pay | Admitting: *Deleted

## 2021-12-28 ENCOUNTER — Encounter: Payer: Self-pay | Admitting: Hematology and Oncology

## 2021-12-28 ENCOUNTER — Inpatient Hospital Stay: Payer: Medicare HMO | Attending: Hematology and Oncology

## 2021-12-28 ENCOUNTER — Inpatient Hospital Stay (HOSPITAL_BASED_OUTPATIENT_CLINIC_OR_DEPARTMENT_OTHER): Payer: Medicare HMO | Admitting: Hematology and Oncology

## 2021-12-28 VITALS — BP 167/95 | HR 80 | Temp 97.9°F | Resp 18 | Ht 67.0 in | Wt 187.4 lb

## 2021-12-28 DIAGNOSIS — Z882 Allergy status to sulfonamides status: Secondary | ICD-10-CM | POA: Diagnosis not present

## 2021-12-28 DIAGNOSIS — Z803 Family history of malignant neoplasm of breast: Secondary | ICD-10-CM | POA: Diagnosis not present

## 2021-12-28 DIAGNOSIS — Z881 Allergy status to other antibiotic agents status: Secondary | ICD-10-CM | POA: Insufficient documentation

## 2021-12-28 DIAGNOSIS — Z9049 Acquired absence of other specified parts of digestive tract: Secondary | ICD-10-CM | POA: Diagnosis not present

## 2021-12-28 DIAGNOSIS — Z8249 Family history of ischemic heart disease and other diseases of the circulatory system: Secondary | ICD-10-CM | POA: Insufficient documentation

## 2021-12-28 DIAGNOSIS — Z87440 Personal history of urinary (tract) infections: Secondary | ICD-10-CM | POA: Diagnosis not present

## 2021-12-28 DIAGNOSIS — Z79899 Other long term (current) drug therapy: Secondary | ICD-10-CM | POA: Diagnosis not present

## 2021-12-28 DIAGNOSIS — Z170 Estrogen receptor positive status [ER+]: Secondary | ICD-10-CM | POA: Diagnosis not present

## 2021-12-28 DIAGNOSIS — Z888 Allergy status to other drugs, medicaments and biological substances status: Secondary | ICD-10-CM | POA: Insufficient documentation

## 2021-12-28 DIAGNOSIS — Z809 Family history of malignant neoplasm, unspecified: Secondary | ICD-10-CM | POA: Insufficient documentation

## 2021-12-28 DIAGNOSIS — C50411 Malignant neoplasm of upper-outer quadrant of right female breast: Secondary | ICD-10-CM | POA: Diagnosis not present

## 2021-12-28 DIAGNOSIS — Z85038 Personal history of other malignant neoplasm of large intestine: Secondary | ICD-10-CM | POA: Insufficient documentation

## 2021-12-28 DIAGNOSIS — R300 Dysuria: Secondary | ICD-10-CM

## 2021-12-28 DIAGNOSIS — Z923 Personal history of irradiation: Secondary | ICD-10-CM | POA: Diagnosis not present

## 2021-12-28 DIAGNOSIS — Z823 Family history of stroke: Secondary | ICD-10-CM | POA: Diagnosis not present

## 2021-12-28 DIAGNOSIS — R310 Gross hematuria: Secondary | ICD-10-CM

## 2021-12-28 DIAGNOSIS — Z841 Family history of disorders of kidney and ureter: Secondary | ICD-10-CM | POA: Diagnosis not present

## 2021-12-28 DIAGNOSIS — I10 Essential (primary) hypertension: Secondary | ICD-10-CM | POA: Diagnosis not present

## 2021-12-28 DIAGNOSIS — Z8719 Personal history of other diseases of the digestive system: Secondary | ICD-10-CM | POA: Insufficient documentation

## 2021-12-28 DIAGNOSIS — Z79810 Long term (current) use of selective estrogen receptor modulators (SERMs): Secondary | ICD-10-CM | POA: Insufficient documentation

## 2021-12-28 DIAGNOSIS — Z880 Allergy status to penicillin: Secondary | ICD-10-CM | POA: Diagnosis not present

## 2021-12-28 DIAGNOSIS — Z8261 Family history of arthritis: Secondary | ICD-10-CM | POA: Diagnosis not present

## 2021-12-28 DIAGNOSIS — Z818 Family history of other mental and behavioral disorders: Secondary | ICD-10-CM | POA: Insufficient documentation

## 2021-12-28 LAB — CMP (CANCER CENTER ONLY)
ALT: 20 U/L (ref 0–44)
AST: 29 U/L (ref 15–41)
Albumin: 3.7 g/dL (ref 3.5–5.0)
Alkaline Phosphatase: 77 U/L (ref 38–126)
Anion gap: 6 (ref 5–15)
BUN: 17 mg/dL (ref 8–23)
CO2: 29 mmol/L (ref 22–32)
Calcium: 9.6 mg/dL (ref 8.9–10.3)
Chloride: 105 mmol/L (ref 98–111)
Creatinine: 0.66 mg/dL (ref 0.44–1.00)
GFR, Estimated: >60 mL/min (ref >60)
Glucose, Bld: 90 mg/dL (ref 70–99)
Potassium: 3.6 mmol/L (ref 3.5–5.1)
Sodium: 140 mmol/L (ref 135–145)
Total Bilirubin: 0.4 mg/dL (ref 0.3–1.2)
Total Protein: 6.9 g/dL (ref 6.5–8.1)

## 2021-12-28 LAB — CBC WITH DIFFERENTIAL (CANCER CENTER ONLY)
Abs Immature Granulocytes: 0.01 10*3/uL (ref 0.00–0.07)
Basophils Absolute: 0 10*3/uL (ref 0.0–0.1)
Basophils Relative: 1 %
Eosinophils Absolute: 0.1 10*3/uL (ref 0.0–0.5)
Eosinophils Relative: 1 %
HCT: 35.8 % — ABNORMAL LOW (ref 36.0–46.0)
Hemoglobin: 12.1 g/dL (ref 12.0–15.0)
Immature Granulocytes: 0 %
Lymphocytes Relative: 35 %
Lymphs Abs: 2 10*3/uL (ref 0.7–4.0)
MCH: 32.4 pg (ref 26.0–34.0)
MCHC: 33.8 g/dL (ref 30.0–36.0)
MCV: 96 fL (ref 80.0–100.0)
Monocytes Absolute: 0.8 10*3/uL (ref 0.1–1.0)
Monocytes Relative: 14 %
Neutro Abs: 2.8 10*3/uL (ref 1.7–7.7)
Neutrophils Relative %: 49 %
Platelet Count: 192 10*3/uL (ref 150–400)
RBC: 3.73 MIL/uL — ABNORMAL LOW (ref 3.87–5.11)
RDW: 12.9 % (ref 11.5–15.5)
WBC Count: 5.6 10*3/uL (ref 4.0–10.5)
nRBC: 0 % (ref 0.0–0.2)

## 2021-12-28 LAB — URINALYSIS, COMPLETE (UACMP) WITH MICROSCOPIC
Bacteria, UA: NONE SEEN
Bilirubin Urine: NEGATIVE
Glucose, UA: NEGATIVE mg/dL
Hgb urine dipstick: NEGATIVE
Ketones, ur: NEGATIVE mg/dL
Leukocytes,Ua: NEGATIVE
Nitrite: NEGATIVE
Protein, ur: NEGATIVE mg/dL
Specific Gravity, Urine: 1.017 (ref 1.005–1.030)
pH: 6 (ref 5.0–8.0)

## 2021-12-28 NOTE — Progress Notes (Signed)
?[place]  ?[phone] [phone]  ? ? ? ?ID: [name] DOB: 13-Sep-1951  MR#: [id number]  HCW#:[id number] ? ?Patient Care Team: ?Janith Lima, MD as PCP - General (Internal Medicine) ?Gavin Pound, MD as Consulting Physician (Rheumatology) ?Willia Craze, NP as Nurse Practitioner (Gastroenterology) ?Magrinat, Virgie Dad, MD (Inactive) as Consulting Physician (Oncology) ?Leanora Cover, MD as Consulting Physician (Orthopedic Surgery) ?OTHER MD: ? ? ?CHIEF COMPLAINT: Estrogen receptor positive breast cancer ? ?CURRENT TREATMENT: tamoxifen  ? ? ?INTERVAL HISTORY: ?[name] returns today for follow-up of her estrogen receptor positive breast cancer.  ?She completed 10 years of tamoxifen and is here for follow-up.  She wants to continue follow-up every 6 months in the breast clinic.  She denies any new changes in her breast.  She denies any other change in her health.  She had an endoscopy and colonoscopy which were unremarkable.  She complains of increased urination.  She has had prior urinary tract infections which needed antibiotics. ?Rest of the pertinent 10 point ROS reviewed and negative. ?COVID 19 VACCINATION STATUS: [place] x3, most recently 07/2020; infection 03/25/2021 ? ? ?BREAST CANCER HISTORY: ?From Dr. Dana Allan earlier summary: ? ?"#1 patient is status post lumpectomy with sentinel lymph node biopsy followed by radiation to the right breast. In December 2009 patient began aromatase inhibitor but could not tolerate it. ? ?#2 therefore she began in March 2010 tamoxifen 20 mg daily. However she has experienced significant amount of aches and pains and in December 2012 discontinued it. At this time she does not want to go back on any kind of antiestrogen therapy due to side effects. ? ?#3 patient would like to go back on tamoxifen 20 mg daily. She recently attended finding your new normal seminar. And because of that she wants to she would like to go back on  the tamoxifen. We discussed side effects again. She was sent to her pharmacy." ? ?Her subsequent history is as detailed below ? ? ?PAST MEDICAL HISTORY: ?Past Medical History:  ?Diagnosis Date  ? Arthritis   ? R knee, hands   ? Back pain   ? Blood transfusion   ? 1986- post-partial hysterectomy  ? Breast cancer ([place]) 2009  ? right lumpectomy and radiation  ? Cataract   ? Chicken pox   ? Colon cancer [facility]) 2003  ? per pt report, cancer found in a colon polyp in 2003, procedure done in [place].  did not require surgery, chemo etc.    ? Depression   ? Diverticulitis   ? Edema of both ankles   ? Family history of breast cancer 10/24/2017  ? Gallbladder problem   ? Gastritis   ? GERD (gastroesophageal reflux disease)   ? recently taken off anti reflux tx  ? Hepatitis   ? Hep. A- 1978  ? Hiatal hernia   ? History of stomach ulcers   ? Hypertension   ? Joint pain   ? Lactose intolerance   ? Lupus ([place])   ? Multiple gastric ulcers   ? Palpitations   ? Personal history of radiation therapy 2009  ? Rheumatic fever   ? Rheumatoid arthritis ([place])   ? SLE (systemic lupus erythematosus related syndrome) ([place])   ? Urinary incontinence   ? UTI (urinary tract infection)   ? ? ?PAST SURGICAL HISTORY: ?Past Surgical History:  ?Procedure Laterality Date  ? ABDOMINAL HYSTERECTOMY    ? 1986  ? APPENDECTOMY    ? 1978 & tubal ligation   ?  BIOPSY  07/25/2018  ? Procedure: BIOPSY;  Surgeon: Milus Banister, MD;  Location: [facility] ENDOSCOPY;  Service: Endoscopy;;  ? BREAST EXCISIONAL BIOPSY Left 1998  ? BREAST LUMPECTOMY Bilateral   ? 2009  ? BREAST SURGERY    ? R breast lumpectomy  ? CHOLECYSTECTOMY    ? 1978- open   ? ESOPHAGOGASTRODUODENOSCOPY (EGD) WITH PROPOFOL N/A 07/25/2018  ? Procedure: ESOPHAGOGASTRODUODENOSCOPY (EGD) WITH PROPOFOL;  Surgeon: Milus Banister, MD;  Location: [facility] ENDOSCOPY;  Service: Endoscopy;  Laterality: N/A;  ? REPLACEMENT TOTAL KNEE Left   ? TONSILLECTOMY    ? 1954  ? TOTAL KNEE ARTHROPLASTY  08/30/2011  ? Procedure: TOTAL  KNEE ARTHROPLASTY;  Surgeon: Kerin Salen;  Location: [place];  Service: Orthopedics;  Laterality: Right;  Right Total Knee Arthroplasty  ? TUBAL LIGATION    ? ? ?FAMILY HISTORY ?Family History  ?Problem Relation Age of Onset  ? Breast cancer Mother 57  ? Hypertension Mother   ? Stroke Mother   ? Depression Mother   ? Heart attack Father   ? Other Father   ? Heart disease Father   ? Sudden death Father   ? Hypertension Sister   ? Hypertension Brother   ? Kidney disease Brother   ? Other Maternal Grandmother 102  ?     brain tumor dx at 44- did not bx or do additional testing on tumor  ? Arthritis Maternal Grandmother   ? Cancer Paternal Grandmother 76  ?     type unk  ? Breast cancer Maternal Aunt 69  ? Cancer Paternal Aunt 59  ?     type unk  ? Cancer Other   ? Hypertension Other   ? Anesthesia problems Neg Hx   ? Hypotension Neg Hx   ? Malignant hyperthermia Neg Hx   ? Pseudochol deficiency Neg Hx   ? Colon cancer Neg Hx   ? Esophageal cancer Neg Hx   ? Stomach cancer Neg Hx   ? Pancreatic cancer Neg Hx   ? Liver disease Neg Hx   ? the patient's father died from a heart attack at the age of 58. The patient's mother died at the age of 15. She had a history of breast cancer and one of her sisters was diagnosed with breast cancer at the age of 60. The patient had 5 brothers and 5 sisters. There is no other history of breast or ovarian cancer in the family to her knowledge. ? ? ?GYNECOLOGIC HISTORY:  ?No LMP recorded. Patient has had a hysterectomy. ?Menarche age 50, first live birth age 29. The patient is GX P3. She had a simple hysterectomy in 1986 (no salpingo-oophorectomy). ? ? ?SOCIAL HISTORY: (Updated February 2022). 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  She remarried in 2011. Her husband Laveda Abbe is retired from [employer]. The patient has one adopted son in addition to her own  children. 3 of her children live in town 1 in [place]. She has 7 grandchildren. She attends [employer] locally. ?  ? ADVANCED DIRECTIVES: Not in place ? ? ?HEALTH MAINTENANCE: ?Social History  ? ?Tobacco Use  ? Smoking status: Never  ? Smokeless tobacco: Never  ?Vaping Use  ? Vaping Use: Never used  ?Substance Use Topics  ? Alcohol use: No  ? Drug use:  No  ? ? ? Colonoscopy: ? PAP: ? Bone density: 04/30/2008 was normal ? Lipid panel: ? ?Allergies  ?Allergen Reactions  ? Iodine Anaphylaxis  ? Iohexol Anaphylaxis  ?   Code: HIVES, Desc: PT IS ALLERGIC TO IODINATED CONTRAST; REACTION INCLUDES FACIAL SWELLING,HIVES,RESPIRATORY DISTRESS;PT HASN'T HAD SCAN W/PREMEDS.;REFUSED CONTRAST OF ANY KIND!  KR ?  ? Latex Anaphylaxis  ? Penicillins Anaphylaxis and Rash  ?  Has patient had a PCN reaction causing immediate rash, facial/tongue/throat swelling, SOB or lightheadedness with hypotension: Yes ?Has patient had a PCN reaction causing severe rash involving mucus membranes or skin necrosis: Yes ?Has patient had a PCN reaction that required hospitalization: No ?Has patient had a PCN reaction occurring within the last 10 years: No ?If all of the above answers are "NO", then may proceed with Cephalosporin use. ?  ? Tetracycline Anaphylaxis  ? Other Other (See Comments)  ? Penicillin G Other (See Comments)  ? Sulfa Antibiotics Hives  ? ? ?Current Outpatient Medications  ?Medication Sig Dispense Refill  ? cholecalciferol (VITAMIN D) 1000 units tablet Take 1,000 Units by mouth daily.    ? dicyclomine (BENTYL) 10 MG capsule TAKE 1 CAPSULE (10 MG TOTAL) BY MOUTH 2 (TWO) TIMES DAILY AS NEEDED FOR SPASMS (ABDOMINAL PAIN). 30 capsule 0  ? hydrochlorothiazide (HYDRODIURIL) 25 MG tablet Take 1 tablet (25 mg total) by mouth daily. Must keep upcoming appointment for further refills 90 tablet 0  ? ibuprofen (ADVIL) 800 MG tablet Take 800 mg by mouth every 8 (eight) hours as needed.    ? losartan (COZAAR) 25 MG  tablet Take 1 tablet (25 mg total) by mouth every other day. Must keep upcoming appointment for further refills 45 tablet 0  ? metoprolol succinate (TOPROL-XL) 25 MG 24 hr tablet Take 1 tablet (25 mg

## 2021-12-29 ENCOUNTER — Telehealth: Payer: Self-pay | Admitting: Hematology and Oncology

## 2021-12-29 LAB — URINE CULTURE: Culture: NO GROWTH

## 2021-12-29 NOTE — Telephone Encounter (Signed)
Scheduled appointment per 04/11 los. Left message.   ?

## 2021-12-31 ENCOUNTER — Other Ambulatory Visit (HOSPITAL_BASED_OUTPATIENT_CLINIC_OR_DEPARTMENT_OTHER): Payer: Self-pay | Admitting: Cardiovascular Disease

## 2022-01-03 ENCOUNTER — Encounter (HOSPITAL_BASED_OUTPATIENT_CLINIC_OR_DEPARTMENT_OTHER): Payer: Self-pay | Admitting: Emergency Medicine

## 2022-01-03 ENCOUNTER — Other Ambulatory Visit: Payer: Self-pay

## 2022-01-03 ENCOUNTER — Emergency Department (HOSPITAL_BASED_OUTPATIENT_CLINIC_OR_DEPARTMENT_OTHER)
Admission: EM | Admit: 2022-01-03 | Discharge: 2022-01-03 | Disposition: A | Discharge status: Home / Self Care | Payer: Medicare HMO | Attending: Emergency Medicine | Admitting: Emergency Medicine

## 2022-01-03 ENCOUNTER — Emergency Department (HOSPITAL_BASED_OUTPATIENT_CLINIC_OR_DEPARTMENT_OTHER): Payer: Medicare HMO

## 2022-01-03 DIAGNOSIS — M25562 Pain in left knee: Secondary | ICD-10-CM

## 2022-01-03 DIAGNOSIS — Z96653 Presence of artificial knee joint, bilateral: Secondary | ICD-10-CM | POA: Diagnosis not present

## 2022-01-03 DIAGNOSIS — Z91040 Latex allergy status: Secondary | ICD-10-CM | POA: Diagnosis not present

## 2022-01-03 DIAGNOSIS — M25462 Effusion, left knee: Secondary | ICD-10-CM | POA: Diagnosis not present

## 2022-01-03 MED ORDER — KETOROLAC TROMETHAMINE 30 MG/ML IJ SOLN
30.0000 mg | Freq: Once | INTRAMUSCULAR | Status: AC
Start: 2022-01-03 — End: 2022-01-03
  Administered 2022-01-03: 30 mg via INTRAMUSCULAR
  Filled 2022-01-03: qty 1

## 2022-01-03 MED ORDER — OXYCODONE-ACETAMINOPHEN 5-325 MG PO TABS: 1.0000 | ORAL_TABLET | Freq: Four times a day (QID) | ORAL | 0 refills | Status: AC | PRN

## 2022-01-03 NOTE — ED Triage Notes (Signed)
Pt c/o left knee pain onset today. Pt has history of same in past where she had fluid and blood in knee. ?

## 2022-01-03 NOTE — Discharge Instructions (Addendum)
Please call your orthopedic office percent or morning to request close follow-up appointment regarding your knee pain.  If you have increasing swelling, any redness, fever, or other new concerning symptom, please return to the emergency room for reassessment. ? ?Recommend Tylenol Motrin as needed for pain.  For breakthrough pain can take the prescribed Percocet.  Please note this does contain some Tylenol and can make you drowsy extremity, driving or operating heavy machinery. ?

## 2022-01-04 NOTE — ED Provider Notes (Signed)
?[place] EMERGENCY DEPT ?Provider Note ? ? ?CSN: [id number] ?Arrival date & time: 01/03/22  1146 ? ?  ? ?History ? ?Chief Complaint  ?Patient presents with  ? Knee Pain  ?  Left  ? ? ?[name] is a 71 y.o. female.  Presenting to the emergency department with concern for left knee pain.  Patient states that she has a remote history of bilateral knee replacements.  States that she has previously had an issue with blood accumulating on her knee and it improved when her orthopedist removed that.  States that pain today is similar to prior, aching, throbbing, worse with movement and improved with rest.  Has not noted any redness or fever.  She denies any major trauma recently. She denies prior septic joint.  ? ?HPI ? ?  ? ?Home Medications ?Prior to Admission medications   ?Medication Sig Start Date End Date Taking? Authorizing Provider  ?hydrochlorothiazide (HYDRODIURIL) 25 MG tablet TAKE 1 TABLET (25 MG TOTAL) BY MOUTH DAILY. MUST KEEP UPCOMING APPOINTMENT FOR FURTHER REFILLS 12/31/21   Skeet Latch, MD  ?oxyCODONE-acetaminophen (PERCOCET/ROXICET) 5-325 MG tablet Take 1 tablet by mouth every 6 (six) hours as needed for up to 3 days for severe pain. 01/03/22 01/06/22 Yes Tanny Harnack, Ellwood Dense, MD  ?cholecalciferol (VITAMIN D) 1000 units tablet Take 1,000 Units by mouth daily.    [provider]  ?dicyclomine (BENTYL) 10 MG capsule TAKE 1 CAPSULE (10 MG TOTAL) BY MOUTH 2 (TWO) TIMES DAILY AS NEEDED FOR SPASMS (ABDOMINAL PAIN). 09/25/21   Noralyn Pick, NP  ?ibuprofen (ADVIL) 800 MG tablet Take 800 mg by mouth every 8 (eight) hours as needed.    [provider]  ?losartan (COZAAR) 25 MG tablet Take 1 tablet (25 mg total) by mouth every other day. Must keep upcoming appointment for further refills 10/05/21   Skeet Latch, MD  ?metoprolol succinate (TOPROL-XL) 25 MG 24 hr tablet Take 1 tablet (25 mg total) by mouth daily. Must keep upcoming appointment for further  refills 10/05/21   Skeet Latch, MD  ?Multiple Vitamins-Minerals (EMERGEN-C IMMUNE PLUS PO) Take 1 tablet by mouth daily.    [provider]  ?omeprazole (PRILOSEC) 40 MG capsule Take 1 capsule (40 mg total) by mouth daily. 02/17/18   Magrinat, Virgie Dad, MD  ?potassium chloride (KLOR-CON) 10 MEQ tablet Take 1 tablet (10 mEq total) by mouth daily. 07/22/21   Hoyt Koch, MD  ?tiZANidine (ZANAFLEX) 2 MG tablet as needed.    [provider]  ?   ? ?Allergies    ?Iodine, Iohexol, Latex, Penicillins, Tetracycline, Other, Penicillin g, and Sulfa antibiotics   ? ?Review of Systems   ?Review of Systems  ?Constitutional:  Negative for chills and fever.  ?HENT:  Negative for ear pain and sore throat.   ?Eyes:  Negative for pain and visual disturbance.  ?Respiratory:  Negative for cough and shortness of breath.   ?Cardiovascular:  Negative for chest pain and palpitations.  ?Gastrointestinal:  Negative for abdominal pain and vomiting.  ?Genitourinary:  Negative for dysuria and hematuria.  ?Musculoskeletal:  Positive for arthralgias. Negative for back pain.  ?Skin:  Negative for color change and rash.  ?Neurological:  Negative for seizures and syncope.  ?All other systems reviewed and are negative. ? ?Physical Exam ?Updated Vital Signs ?BP (!) 159/78 (BP Location: Left Arm)   Pulse 60   Temp 98.2 ?F (36.8 ?C)   Resp 16   Ht '5\' 7"'$  (1.702 m)   Wt  85 kg   SpO2 98%   BMI 29.35 kg/m?  ?Physical Exam ?Vitals and nursing note reviewed.  ?Constitutional:   ?   General: She is not in acute distress. ?   Appearance: She is well-developed.  ?HENT:  ?   Head: Normocephalic and atraumatic.  ?Eyes:  ?   Conjunctiva/sclera: Conjunctivae normal.  ?Cardiovascular:  ?   Rate and Rhythm: Normal rate.  ?   Pulses: Normal pulses.  ?   Heart sounds: No murmur heard. ?Pulmonary:  ?   Effort: Pulmonary effort is normal. No respiratory distress.  ?Musculoskeletal:  ?   Cervical back: Neck supple.  ?   Comments: Left  knee appears grossly normal on physical exam.  There is some tenderness to the anterior knee, patient is able to fully extend the knee without significant pain, can flex about 45 degrees without pain, there is no overlying erythema or warmth  ?Skin: ?   General: Skin is warm and dry.  ?   Capillary Refill: Capillary refill takes less than 2 seconds.  ?Neurological:  ?   General: No focal deficit present.  ?   Mental Status: She is alert.  ?Psychiatric:     ?   Mood and Affect: Mood normal.  ? ? ?ED Results / Procedures / Treatments   ?Labs ?(all labs ordered are listed, but only abnormal results are displayed) ?Labs Reviewed - No data to display ? ?EKG ?None ? ?Radiology ?DG Knee Complete 4 Views Left ? ?Result Date: 01/03/2022 ?CLINICAL DATA:  Pain left knee EXAM: LEFT KNEE - COMPLETE 4+ VIEW COMPARISON:  Images of previous study done on 09/01/2000 are not available for comparison at the time of this report. FINDINGS: There is previous left knee arthroplasty. No recent fracture or dislocation is seen. Small effusion is present in the suprapatellar bursa. In the lateral view, there is 1.9 cm low-density structure in the proximal shaft of tibia with thin sclerotic rim. There is no break in the cortical margins. In the AP view, there is minimal scalloping of lateral margin of medial cortex of proximal tibia in the same region. IMPRESSION: Previous left knee arthroplasty. Small effusion is present in the suprapatellar bursa. No recent fracture or dislocation is seen. There is 1.9 cm radiolucency with thin sclerotic margins in the proximal shaft of left tibia. This may suggest benign process such as bone cyst or some other inflammatory or neoplastic process. Short-term follow-up radiographic examination of left tibia and radionuclide bone scan or MRI as warranted may be considered. Electronically Signed   By: Elmer Picker M.D.   On: 01/03/2022 15:50   ? ?Procedures ?Procedures  ? ? ?Medications Ordered in  ED ?Medications  ?ketorolac (TORADOL) 30 MG/ML injection 30 mg (30 mg Intramuscular Given 01/03/22 1634)  ? ? ?ED Course/ Medical Decision Making/ A&P ?  ?                        ?Medical Decision Making ?Amount and/or Complexity of Data Reviewed ?Radiology: ordered. ? ?Risk ?Prescription drug management. ? ? ?71 year old lady presenting to the emergency room with concern for left knee pain.  Notable history of prior left knee replacement surgery many many years ago.  On physical exam today patient's knee appears grossly normal.  There is some tenderness to the anterior portion of the knee.  Notably there is no significant swelling, no warmth or erythema.  Patient is able to fully extend the knee and can at  least partially flex the knee without significant pain.  X-ray without fracture or dislocation.  I independently reviewed x-ray.  Radiologist commented on radiolucency in the left tibia to suggest benign process versus neoplastic process.  Based on my physical exam, currently I have exceedingly low suspicion for septic joint and I do not believe a diagnostic arthrocentesis to rule out septic joint is necessary or indicated at this time.  I reviewed the x-ray findings with patient and instructed patient that she will likely need additional imaging.  I recommend that she follow-up with her primary orthopedist for further management.  Reviewed return precautions and discharged home. ? ? ? ?After the discussed management above, the patient was determined to be safe for discharge.  The patient was in agreement with this plan and all questions regarding their care were answered.  ED return precautions were discussed and the patient will return to the ED with any significant worsening of condition. ? ? ? ? ? ? ? ? ?Final Clinical Impression(s) / ED Diagnoses ?Final diagnoses:  ?Acute pain of left knee  ? ? ?Rx / DC Orders ?ED Discharge Orders   ? ?      Ordered  ?  oxyCODONE-acetaminophen (PERCOCET/ROXICET) 5-325 MG  tablet  Every 6 hours PRN       ? 01/03/22 1651  ? ?  ?  ? ?  ? ? ?  ?Lucrezia Starch, MD ?01/04/22 2147 ? ?

## 2022-01-05 NOTE — Progress Notes (Incomplete)
? ? ?Cardiology Office Note ? ? ?Date:  01/05/2022  ? ?ID:  [name], DOB 07/04/1951, MRN [id number] ? ?PCP:  Janith Lima, MD  ?Cardiologist:   Madelin Rear  ? ?No chief complaint on file. ? ?  ?History of Present Illness: ?[name] is a 71 y.o. female with SLE, hypertension, prior breast cancerwho is being seen today for follow-up. She was initially seen 07/11/2018 for the evaluation of shortness of breath and palpitations at the request of Janith Lima, MD.  [name] had noted palpitations off and on for 2 months.  The episodes occurred almost daily and frequently several times throughout the day.  Her heart felt as though it was beating irregularly and speeding up and slowing down. Associated symptoms included lightheadedness and shortness of breath.  She does not drink caffeine or use over-the-counter cold or cough medications regularly.  When she has the palpitations she gets lightheaded and short of breath but denies chest pain.  She denies syncope.  This episode sometimes awaken her from sleep but also occur frequently throughout the day.  They do not seem to be associated with exertion.  During this time she has also been feeling short of breath and fatigued.  The symptoms are ongoing both at rest and with exertion.  She has mild lower extremity edema to the ankles when she stands or sits for too long.  This is unchanged from her baseline and improves with elevation of her legs.  She has no orthopnea or PND.  She noticed that she had started on BuSpar right before the symptoms started.  However she was taking this medication in the past and had no side effects. ? ?[name] has a history of lupus.  She has experienced symptoms of rash, fever, and blurred vision.  Lately she has not needed any medications.  Her symptoms seem to be worse with changes in the season.  She had breast cancer twice.  The first was in 1998 and improved with tamoxifen only.  The second was in 2009 in the  opposite breast.  This was treated with radiation and tamoxifen.  She reports that her father had a heart attack at age 49 but that he was a heavy smoker.  She notes that she does not like to take medications.  ? ?She last followed up with Kerin Ransom, PA-C by video visit on 09/29/2020. At that time she reported well controlled blood pressures at home, usually 110/70. Labs on 09/04/2020 showed a potassium of 4.2, BUN of 14, serum creatinine of 0.76. She requested a referral to the weight loss clinic. ? ?Today, *** ? ?Past Medical History:  ?Diagnosis Date  ? Arthritis   ? R knee, hands   ? Back pain   ? Blood transfusion   ? 1986- post-partial hysterectomy  ? Breast cancer ([place]) 2009  ? right lumpectomy and radiation  ? Cataract   ? Chicken pox   ? Colon cancer [facility]) 2003  ? per pt report, cancer found in a colon polyp in 2003, procedure done in [place].  did not require surgery, chemo etc.    ? Depression   ? Diverticulitis   ? Edema of both ankles   ? Family history of breast cancer 10/24/2017  ? Gallbladder problem   ? Gastritis   ? GERD (gastroesophageal reflux disease)   ? recently taken off anti reflux tx  ? Hepatitis   ? Hep. A- 1978  ? Hiatal hernia   ?  History of stomach ulcers   ? Hypertension   ? Joint pain   ? Lactose intolerance   ? Lupus ([place])   ? Multiple gastric ulcers   ? Palpitations   ? Personal history of radiation therapy 2009  ? Rheumatic fever   ? Rheumatoid arthritis ([place])   ? SLE (systemic lupus erythematosus related syndrome) ([place])   ? Urinary incontinence   ? UTI (urinary tract infection)   ? ? ?Past Surgical History:  ?Procedure Laterality Date  ? ABDOMINAL HYSTERECTOMY    ? 1986  ? APPENDECTOMY    ? 1978 & tubal ligation   ? BIOPSY  07/25/2018  ? Procedure: BIOPSY;  Surgeon: Milus Banister, MD;  Location: [facility] ENDOSCOPY;  Service: Endoscopy;;  ? BREAST EXCISIONAL BIOPSY Left 1998  ? BREAST LUMPECTOMY Bilateral   ? 2009  ? BREAST SURGERY    ? R breast lumpectomy  ? CHOLECYSTECTOMY    ? 1978-  open   ? ESOPHAGOGASTRODUODENOSCOPY (EGD) WITH PROPOFOL N/A 07/25/2018  ? Procedure: ESOPHAGOGASTRODUODENOSCOPY (EGD) WITH PROPOFOL;  Surgeon: Milus Banister, MD;  Location[facility] ENDOSCOPY;  Service: Endoscopy;  Laterality: N/A;  ? REPLACEMENT TOTAL KNEE Left   ? TONSILLECTOMY    ? 1954  ? TOTAL KNEE ARTHROPLASTY  08/30/2011  ? Procedure: TOTAL KNEE ARTHROPLASTY;  Surgeon: Kerin Salen;  Location: [place];  Service: Orthopedics;  Laterality: Right;  Right Total Knee Arthroplasty  ? TUBAL LIGATION    ? ? ? ?Current Outpatient Medications  ?Medication Sig Dispense Refill  ? hydrochlorothiazide (HYDRODIURIL) 25 MG tablet TAKE 1 TABLET (25 MG TOTAL) BY MOUTH DAILY. MUST KEEP UPCOMING APPOINTMENT FOR FURTHER REFILLS 30 tablet 0  ? cholecalciferol (VITAMIN D) 1000 units tablet Take 1,000 Units by mouth daily.    ? dicyclomine (BENTYL) 10 MG capsule TAKE 1 CAPSULE (10 MG TOTAL) BY MOUTH 2 (TWO) TIMES DAILY AS NEEDED FOR SPASMS (ABDOMINAL PAIN). 30 capsule 0  ? ibuprofen (ADVIL) 800 MG tablet Take 800 mg by mouth every 8 (eight) hours as needed.    ? losartan (COZAAR) 25 MG tablet Take 1 tablet (25 mg total) by mouth every other day. Must keep upcoming appointment for further refills 45 tablet 0  ? metoprolol succinate (TOPROL-XL) 25 MG 24 hr tablet Take 1 tablet (25 mg total) by mouth daily. Must keep upcoming appointment for further refills 90 tablet 0  ? Multiple Vitamins-Minerals (EMERGEN-C IMMUNE PLUS PO) Take 1 tablet by mouth daily.    ? omeprazole (PRILOSEC) 40 MG capsule Take 1 capsule (40 mg total) by mouth daily. 30 capsule 5  ? oxyCODONE-acetaminophen (PERCOCET/ROXICET) 5-325 MG tablet Take 1 tablet by mouth every 6 (six) hours as needed for up to 3 days for severe pain. 10 tablet 0  ? potassium chloride (KLOR-CON) 10 MEQ tablet Take 1 tablet (10 mEq total) by mouth daily. 90 tablet 1  ? tiZANidine (ZANAFLEX) 2 MG tablet as needed.    ? ?No current facility-administered medications for this visit.   ? ? ?Allergies:   Iodine, Iohexol, Latex, Penicillins, Tetracycline, Other, Penicillin g, and Sulfa antibiotics  ? ? ?Social History:  The patient  reports that she has never smoked. She has never used smokeless tobacco. She reports that she does not drink alcohol and does not use drugs.  ? ?Family History:  The patient's family history includes Arthritis in her maternal grandmother; Breast cancer (age of onset: 6) in her maternal aunt; Breast cancer (age of onset: 83) in her mother;  Cancer in an other family member; Cancer (age of onset: 108) in her paternal aunt; Cancer (age of onset: 64) in her paternal grandmother; Depression in her mother; Heart attack in her father; Heart disease in her father; Hypertension in her brother, mother, sister, and another family member; Kidney disease in her brother; Other in her father; Other (age of onset: 30) in her maternal grandmother; Stroke in her mother; Sudden death in her father.  ? ? ?ROS:  Please see the history of present illness.   Otherwise, review of systems are positive for none.   All other systems are reviewed and negative.  ? ? ?PHYSICAL EXAM: ?VS:  There were no vitals taken for this visit. , BMI There is no height or weight on file to calculate BMI. ?GENERAL:  Well appearing ?HEENT:  Pupils equal round and reactive, fundi not visualized, oral mucosa unremarkable ?NECK:  No jugular venous distention, waveform within normal limits, carotid upstroke brisk and symmetric, no bruits, no thyromegaly ?LUNGS:  Clear to auscultation bilaterally ?HEART:  RRR.  PMI not displaced or sustained,S1 and S2 within normal limits, no S3, no S4, no clicks, no rubs, no murmurs ?ABD:  Flat, positive bowel sounds normal in frequency in pitch, no bruits, no rebound, no guarding, no midline pulsatile mass, no hepatomegaly, no splenomegaly ?EXT:  2 plus pulses throughout, no edema, no cyanosis no clubbing ?SKIN:  No rashes no nodules ?NEURO:  Cranial nerves II through XII grossly  intact, motor grossly intact throughout ?PSYCH:  Cognitively intact, oriented to person place and time ? ? ?Monitor 02/2021: ?Patient had a minimum heart rate of 52 bpm, maximum heart rate of 141 bpm, and average heart rat

## 2022-01-07 ENCOUNTER — Ambulatory Visit (HOSPITAL_BASED_OUTPATIENT_CLINIC_OR_DEPARTMENT_OTHER): Payer: Medicare HMO | Admitting: Cardiovascular Disease

## 2022-01-07 NOTE — Progress Notes (Signed)
? ? ?Cardiology Office Note ? ? ?Date:  01/11/2022  ? ?ID:  [name], DOB 1951/04/21, MRN [id number] ? ?PCP:  Jaclyn Lima, MD  ?Cardiologist:   Jaclyn Latch, MD  ? ?No chief complaint on file. ? ?  ?History of Present Illness: ?[name] is a 71 y.o. female with SLE, hypertension, prior breast cancerwho is being seen today for follow-up. She was initially seen 06/2018 for the evaluation of shortness of breath and palpitations at the request of Jaclyn Lima, MD.  She notice they started after starting BuSpar. She wore a monitor 06/2018 that showed occasional PACs and PVCs. She followed-up with Jaclyn Ransom, PA 09/2020 due to elevated blood pressures but her blood pressure was controlled in the office. [name] ordered another monitor on 01/2021 that again showed PACs and PVCs. At the times when the patient triggered the monitor, sinus rhythm was detected.  ? ?Today, she is doing well. Her L knee swells with fluid and blood and her orthopedic doctor is planning to perform and MRI. Her bilateral feet will also occasionally swell but the swelling does not improve with elevation. She reports the swelling is a chronic issue. Additionally, she reports cramp-like chest pain under her L breast that occurs randomly and it improves with stretching her L arm. Her blood pressure at home is typically within 852D systolic but can increase up to 782U systolic. She presented to the ER and HCTZ was switched to losartan. She notices palpitations if she misses a Nguyen of HCTZ. She recently joined the [facility] and walks daily for 30 minutes. Her weight has decreased from 215 to 188 lbs. She is a retired [employer] scientist. She denies any shortness of breath, lightheadedness, headaches, syncope, orthopnea, PND, or exertional symptoms. ? ?Past Medical History:  ?Diagnosis Date  ? Arthritis   ? R knee, hands   ? Back pain   ? Blood transfusion   ? 1986- post-partial hysterectomy  ? Breast cancer ([place])  2009  ? right lumpectomy and radiation  ? Cataract   ? Chicken pox   ? Colon cancer [facility]) 2003  ? per pt report, cancer found in a colon polyp in 2003, procedure done in [place].  did not require surgery, chemo etc.    ? Depression   ? Diverticulitis   ? Edema of both ankles   ? Family history of breast cancer 10/24/2017  ? Gallbladder problem   ? Gastritis   ? GERD (gastroesophageal reflux disease)   ? recently taken off anti reflux tx  ? Hepatitis   ? Hep. A- 1978  ? Hiatal hernia   ? History of stomach ulcers   ? Hypertension   ? Joint pain   ? Lactose intolerance   ? Lower extremity edema 01/11/2022  ? Lupus ([place])   ? Multiple gastric ulcers   ? Palpitations   ? Personal history of radiation therapy 2009  ? Rheumatic fever   ? Rheumatoid arthritis ([place])   ? SLE (systemic lupus erythematosus related syndrome) ([place])   ? Urinary incontinence   ? UTI (urinary tract infection)   ? ? ?Past Surgical History:  ?Procedure Laterality Date  ? ABDOMINAL HYSTERECTOMY    ? 1986  ? APPENDECTOMY    ? 1978 & tubal ligation   ? BIOPSY  07/25/2018  ? Procedure: BIOPSY;  Surgeon: Jaclyn Banister, MD;  Location: [facility] ENDOSCOPY;  Service: Endoscopy;;  ? BREAST EXCISIONAL BIOPSY Left 1998  ? BREAST  LUMPECTOMY Bilateral   ? 2009  ? BREAST SURGERY    ? R breast lumpectomy  ? CHOLECYSTECTOMY    ? 1978- open   ? ESOPHAGOGASTRODUODENOSCOPY (EGD) WITH PROPOFOL N/A 07/25/2018  ? Procedure: ESOPHAGOGASTRODUODENOSCOPY (EGD) WITH PROPOFOL;  Surgeon: Jaclyn Banister, MD;  Location: [facility] ENDOSCOPY;  Service: Endoscopy;  Laterality: N/A;  ? REPLACEMENT TOTAL KNEE Left   ? TONSILLECTOMY    ? 1954  ? TOTAL KNEE ARTHROPLASTY  08/30/2011  ? Procedure: TOTAL KNEE ARTHROPLASTY;  Surgeon: [name];  Location: [place];  Service: Orthopedics;  Laterality: Right;  Right Total Knee Arthroplasty  ? TUBAL LIGATION    ? ? ? ?Current Outpatient Medications  ?Medication Sig Dispense Refill  ? cholecalciferol (VITAMIN D) 1000 units tablet Take 1,000 Units by mouth  daily.    ? dicyclomine (BENTYL) 10 MG capsule TAKE 1 CAPSULE (10 MG TOTAL) BY MOUTH 2 (TWO) TIMES DAILY AS NEEDED FOR SPASMS (ABDOMINAL PAIN). 30 capsule 0  ? hydrochlorothiazide (HYDRODIURIL) 25 MG tablet TAKE 1 TABLET (25 MG TOTAL) BY MOUTH DAILY. MUST KEEP UPCOMING APPOINTMENT FOR FURTHER REFILLS 30 tablet 0  ? ibuprofen (ADVIL) 800 MG tablet Take 800 mg by mouth every 8 (eight) hours as needed.    ? losartan (COZAAR) 25 MG tablet Take 1 tablet (25 mg total) by mouth every other day. Must keep upcoming appointment for further refills 45 tablet 0  ? metoprolol succinate (TOPROL-XL) 25 MG 24 hr tablet Take 1 tablet (25 mg total) by mouth daily. Must keep upcoming appointment for further refills 90 tablet 0  ? Multiple Vitamins-Minerals (EMERGEN-C IMMUNE PLUS PO) Take 1 tablet by mouth daily.    ? omeprazole (PRILOSEC) 40 MG capsule Take 1 capsule (40 mg total) by mouth daily. 30 capsule 5  ? potassium chloride (KLOR-CON) 10 MEQ tablet Take 1 tablet (10 mEq total) by mouth daily. 90 tablet 1  ? ?No current facility-administered medications for this visit.  ? ? ?Allergies:   Iodine, Iohexol, Latex, Penicillins, Tetracycline, Other, Penicillin g, and Sulfa antibiotics  ? ? ?Social History:  The patient  reports that she has never smoked. She has never used smokeless tobacco. She reports that she does not drink alcohol and does not use drugs.  ? ?Family History:  The patient's family history includes Arthritis in her maternal grandmother; Breast cancer (age of onset: 67) in her maternal aunt; Breast cancer (age of onset: 56) in her mother; Cancer in an other family member; Cancer (age of onset: 55) in her paternal aunt; Cancer (age of onset: 70) in her paternal grandmother; Depression in her mother; Heart attack in her father; Heart disease in her father; Hypertension in her brother, mother, sister, and another family member; Kidney disease in her brother; Other in her father; Other (age of onset: 34) in her maternal  grandmother; Stroke in her mother; Sudden death in her father.  ? ? ?ROS:  Please see the history of present illness.    ?(+) LE edema (L knee and bilateral feet) ?(+) Chest pain ?(+) Palpitations ?Otherwise, review of systems are positive for none. All other systems are reviewed and negative.  ? ? ?PHYSICAL EXAM: ?VS:  BP (!) 142/68 (BP Location: Left Arm, Patient Position: Sitting, Cuff Size: Normal)   Pulse 60   Ht '5\' 7"'$  (1.702 m)   Wt 188 lb 1.6 oz (85.3 kg)   BMI 29.46 kg/m?  , BMI Body mass index is 29.46 kg/m?. ?GENERAL:  Well appearing ?HEENT:  Pupils equal round and reactive, fundi not visualized, oral mucosa unremarkable ?NECK:  No jugular venous distention, waveform within normal limits, carotid upstroke brisk and symmetric, no bruits, no thyromegaly ?LUNGS:  Clear to auscultation bilaterally ?HEART:  RRR.  PMI not displaced or sustained,S1 and S2 within normal limits, no S3, no S4, no clicks, no rubs, no murmurs ?ABD:  Flat, positive bowel sounds normal in frequency in pitch, no bruits, no rebound, no guarding, no midline pulsatile mass, no hepatomegaly, no splenomegaly ?EXT:  2 plus pulses throughout, no edema, no cyanosis no clubbing ?SKIN:  No rashes no nodules ?NEURO:  Cranial nerves II through XII grossly intact, motor grossly intact throughout ?PSYCH:  Cognitively intact, oriented to person place and time ? ?Monitor 02/17/21 ?Patient had a minimum heart rate of 52 bpm, maximum heart rate of 141 bpm, and average heart rate of 76 bpm. ?Predominant underlying rhythm was sinus rhythm. ?One run of non-sustained ventricular tachycardia occurred lasting 6 beats at longest with a max rate of 131 bpm at fastest. ?Isolated PACs were rare (<1.0%). ?Isolated PVCs were occasional (1.0%). ?No evidence of complete heart block or atrial fibrillation. ?Triggered and diary events associated with sinus rhythm. ?  ?No malignant arrhythmias. ? ?Monitor 07/20/18 ?48 Hour Holter Monitor ?  ?Quality: Fair.  Baseline  artifact. ?Predominant rhythm: sinus rhythm ?Average heart rate: 68 bpm ?Max heart rate: 106 bpm ?Min heart rate: 46 bpm ? ?PACs (0.12%) and PVCs (1.8%) were noted.  ? ?Echo 07/20/18 ?- Left ventricle: The cavit

## 2022-01-11 ENCOUNTER — Encounter (HOSPITAL_BASED_OUTPATIENT_CLINIC_OR_DEPARTMENT_OTHER): Payer: Self-pay | Admitting: Cardiovascular Disease

## 2022-01-11 ENCOUNTER — Ambulatory Visit (HOSPITAL_BASED_OUTPATIENT_CLINIC_OR_DEPARTMENT_OTHER): Payer: Medicare HMO | Admitting: Cardiovascular Disease

## 2022-01-11 DIAGNOSIS — I10 Essential (primary) hypertension: Secondary | ICD-10-CM

## 2022-01-11 DIAGNOSIS — R002 Palpitations: Secondary | ICD-10-CM | POA: Diagnosis not present

## 2022-01-11 DIAGNOSIS — R600 Localized edema: Secondary | ICD-10-CM | POA: Insufficient documentation

## 2022-01-11 DIAGNOSIS — E6609 Other obesity due to excess calories: Secondary | ICD-10-CM

## 2022-01-11 DIAGNOSIS — Z6832 Body mass index (BMI) 32.0-32.9, adult: Secondary | ICD-10-CM

## 2022-01-11 HISTORY — DX: Localized edema: R60.0

## 2022-01-11 NOTE — Assessment & Plan Note (Signed)
Blood pressure is above goal.  It seems to be higher in the evenings.  It was actually higher on repeat than initially.  Ideally, she would like to get off of some of her medicines.  Given her blood pressures today we are not able to do it at this time.  She does think that she has some whitecoat hypertension and that overall her pressures have been better controlled at home.  She will continue HCTZ, losartan, and metoprolol.  She is going to track her blood pressures and bring both her machine and her blood pressure log to follow-up in a few months.  She will keep working on weight loss and exercise.  She was congratulated on her efforts thus far. ?

## 2022-01-11 NOTE — Assessment & Plan Note (Signed)
[name] is done a great job of losing weight.  Her BMI has now decreased to 29.  She will keep working with healthy weight and wellness clinic and exercising. ?

## 2022-01-11 NOTE — Patient Instructions (Addendum)
Medication Instructions:  ?Your physician recommends that you continue on your current medications as directed. Please refer to the Current Medication list given to you today.  ? ?Labwork: ?NONE ? ?Testing/Procedures: ?NONE ? ?Follow-Up: ?3 MONTHS WITH CAITLIN W NP  ? ?1 YEAR WITH DR [place]us [place]  ? ?Any Other Special Instructions Will Be Listed Below (If Applicable). ? ?CONTINUE TO WORK ON DIET AND EXERCISE ? ?MONITOR YOUR BLOOD PRESSURE DAILY FOR 1 MONTH PRIOR TO FOLLOW UP. BRING YOUR READINGS AND MACHINE TO FOLLOW UP  ? ?If you need a refill on your cardiac medications before your next appointment, please call your pharmacy. ? ?Primary Care Doctors:  ?Dr. Glendale Chard ?Dr. Grier Mitts ? ?

## 2022-01-11 NOTE — Assessment & Plan Note (Signed)
Likely due to venous insufficiency.  No edema noted on exam today.  She does not have any signs of heart failure or volume overload on exam.  Recommended using compression stocks.  We did discuss getting a BNP but she declines. ?

## 2022-01-11 NOTE — Assessment & Plan Note (Signed)
Stable on metoprolol.  Monitors have previously shown PACs and PVCs. ?

## 2022-01-14 ENCOUNTER — Other Ambulatory Visit (HOSPITAL_COMMUNITY): Payer: Self-pay | Admitting: Orthopedic Surgery

## 2022-01-14 ENCOUNTER — Other Ambulatory Visit: Payer: Self-pay | Admitting: Orthopedic Surgery

## 2022-01-14 DIAGNOSIS — M25462 Effusion, left knee: Secondary | ICD-10-CM | POA: Diagnosis not present

## 2022-01-14 DIAGNOSIS — Z96652 Presence of left artificial knee joint: Secondary | ICD-10-CM | POA: Diagnosis not present

## 2022-01-19 DIAGNOSIS — M25562 Pain in left knee: Secondary | ICD-10-CM | POA: Diagnosis not present

## 2022-01-24 ENCOUNTER — Other Ambulatory Visit (HOSPITAL_BASED_OUTPATIENT_CLINIC_OR_DEPARTMENT_OTHER): Payer: Self-pay | Admitting: Cardiovascular Disease

## 2022-01-25 NOTE — Telephone Encounter (Signed)
Rx(s) sent to pharmacy electronically.  

## 2022-02-03 ENCOUNTER — Encounter (HOSPITAL_COMMUNITY)
Admission: RE | Admit: 2022-02-03 | Discharge: 2022-02-03 | Disposition: A | Discharge status: Home / Self Care | Payer: Medicare HMO | Source: Ambulatory Visit | Attending: Orthopedic Surgery | Admitting: Orthopedic Surgery

## 2022-02-03 DIAGNOSIS — M25562 Pain in left knee: Secondary | ICD-10-CM | POA: Insufficient documentation

## 2022-02-03 DIAGNOSIS — M25462 Effusion, left knee: Secondary | ICD-10-CM | POA: Diagnosis not present

## 2022-02-03 DIAGNOSIS — Z96642 Presence of left artificial hip joint: Secondary | ICD-10-CM | POA: Diagnosis not present

## 2022-02-03 DIAGNOSIS — M7989 Other specified soft tissue disorders: Secondary | ICD-10-CM | POA: Diagnosis not present

## 2022-02-03 MED ORDER — TECHNETIUM TC 99M MEDRONATE IV KIT
19.2000 | PACK | Freq: Once | INTRAVENOUS | Status: AC
  Administered 2022-02-03: 19.2 via INTRAVENOUS

## 2022-02-05 DIAGNOSIS — E119 Type 2 diabetes mellitus without complications: Secondary | ICD-10-CM | POA: Diagnosis not present

## 2022-02-16 DIAGNOSIS — M25562 Pain in left knee: Secondary | ICD-10-CM | POA: Diagnosis not present

## 2022-02-16 DIAGNOSIS — Z96652 Presence of left artificial knee joint: Secondary | ICD-10-CM | POA: Diagnosis not present

## 2022-04-05 DIAGNOSIS — R350 Frequency of micturition: Secondary | ICD-10-CM | POA: Diagnosis not present

## 2022-04-05 DIAGNOSIS — M1991 Primary osteoarthritis, unspecified site: Secondary | ICD-10-CM | POA: Diagnosis not present

## 2022-04-05 DIAGNOSIS — M329 Systemic lupus erythematosus, unspecified: Secondary | ICD-10-CM | POA: Diagnosis not present

## 2022-04-05 DIAGNOSIS — E663 Overweight: Secondary | ICD-10-CM | POA: Diagnosis not present

## 2022-04-05 DIAGNOSIS — Z6828 Body mass index (BMI) 28.0-28.9, adult: Secondary | ICD-10-CM | POA: Diagnosis not present

## 2022-04-11 NOTE — Progress Notes (Signed)
Office Visit    Patient Name: [name] Date of Encounter: 04/12/2022  PCP:  Janith Lima, MD   [facility]  Cardiologist:  Skeet Latch, MD  Advanced Practice Provider:  No care team member to display Electrophysiologist:  None      Chief Complaint    [name] is a 71 y.o. female  presents today for follow-up of hypertension  Past Medical History    Past Medical History:  Diagnosis Date   Arthritis    R knee, hands    Back pain    Blood transfusion    1986- post-partial hysterectomy   Breast cancer ([place]) 2009   right lumpectomy and radiation   Cataract    Chicken pox    Colon cancer ([place]) 2003   per pt report, cancer found in a colon polyp in 2003, procedure done in [place].  did not require surgery, chemo etc.     Depression    Diverticulitis    Edema of both ankles    Family history of breast cancer 10/24/2017   Gallbladder problem    Gastritis    GERD (gastroesophageal reflux disease)    recently taken off anti reflux tx   Hepatitis    Hep. A- 1978   Hiatal hernia    History of stomach ulcers    Hypertension    Joint pain    Lactose intolerance    Lower extremity edema 01/11/2022   Lupus ([place])    Multiple gastric ulcers    Palpitations    Personal history of radiation therapy 2009   Rheumatic fever    Rheumatoid arthritis ([place])    SLE (systemic lupus erythematosus related syndrome) ([place])    Urinary incontinence    UTI (urinary tract infection)    Past Surgical History:  Procedure Laterality Date   [place] & tubal ligation    BIOPSY  07/25/2018   Procedure: BIOPSY;  Surgeon: Milus Banister, MD;  Location: [facility] ENDOSCOPY;  Service: Endoscopy;;   BREAST EXCISIONAL BIOPSY Left 1998   BREAST LUMPECTOMY Bilateral    2009   BREAST SURGERY     R breast lumpectomy   CHOLECYSTECTOMY     1978- open    ESOPHAGOGASTRODUODENOSCOPY (EGD) WITH PROPOFOL N/A  07/25/2018   Procedure: ESOPHAGOGASTRODUODENOSCOPY (EGD) WITH PROPOFOL;  Surgeon: Milus Banister, MD;  Location: [facility];  Service: Endoscopy;  Laterality: N/A;   REPLACEMENT TOTAL KNEE Left    TONSILLECTOMY     1954   TOTAL KNEE ARTHROPLASTY  08/30/2011   Procedure: TOTAL KNEE ARTHROPLASTY;  Surgeon: Kerin Salen;  Location: [facility];  Service: Orthopedics;  Laterality: Right;  Right Total Knee Arthroplasty   TUBAL LIGATION      Allergies  Allergies  Allergen Reactions   Iodine Anaphylaxis   Iohexol Anaphylaxis     Code: HIVES, Desc: PT IS ALLERGIC TO IODINATED CONTRAST; REACTION INCLUDES FACIAL SWELLING,HIVES,RESPIRATORY DISTRESS;PT HASN'T HAD SCAN W/PREMEDS.;REFUSED CONTRAST OF ANY KIND!  KR    Latex Anaphylaxis   Penicillins Anaphylaxis and Rash    Has patient had a PCN reaction causing immediate rash, facial/tongue/throat swelling, SOB or lightheadedness with hypotension: Yes Has patient had a PCN reaction causing severe rash involving mucus membranes or skin necrosis: Yes Has patient had a PCN reaction that required hospitalization: No Has patient had a PCN reaction occurring within the last 10 years: No If  all of the above answers are "NO", then may proceed with Cephalosporin use.    Tetracycline Anaphylaxis   Other Other (See Comments)   Penicillin G Other (See Comments)   Sulfa Antibiotics Hives    History of Present Illness    [name] is a 71 y.o. female with a hx of SLE, HTN, prior breast cancer, PVC, PAC last seen 01/11/22 by Dr. Oval Linsey  Initial evaluation 06/2018 for shortness of breath and  palpitations. Monitor 06/2018 occasional PVC and PAC. Repeat monitor 01/2021 with PACs and PVCs. Patient triggered episodes associated with NSR.   Last seen 01/11/22. Noted L knee swelling with orthopedics planning for MRI. Occasional bialteral feet swelling that did not improve with elevation. BP at home 130s-170s. In ED visit the week prior HCTZ was transitioned  to Losartan. SWorking on weight loss with [facility]. She had a goal to be off some BP medications but as BP remained above goal HCTZ, Losartan, Metoprolol were continued.   She presents today for follow-up independently.  Does note her husband has had some recent health issues and she has been acting as caretaker.  She enjoys traveling and has a trip to [place] in [place] planned for next summer. Notes ocasional discomfort under her left breast. Happens both at rest or with activity.  Most notable with bending over and feels like a cramp.  Reassurance provided that this is likely musculoskeletal. BP at home 110/70 at most 120/80. Reports no shortness of breath nor dyspnea on exertion. No edema, orthopnea, PND. Reports no palpitations.     EKGs/Labs/Other Studies Reviewed:   The following studies were reviewed today:   EKG:  No EKG today.   Recent Labs: 06/29/2021: TSH 1.610 12/28/2021: ALT 20; BUN 17; Creatinine 0.66; Hemoglobin 12.1; Platelet Count 192; Potassium 3.6; Sodium 140  Recent Lipid Panel    Component Value Date/Time   CHOL 153 06/29/2021 1319   TRIG 83 06/29/2021 1319   HDL 57 06/29/2021 1319   CHOLHDL 3 12/30/2020 1523   VLDL 21.8 12/30/2020 1523   LDLCALC 80 06/29/2021 1319     Home Medications   Current Meds  Medication Sig   cholecalciferol (VITAMIN D) 1000 units tablet Take 1,000 Units by mouth daily.   dicyclomine (BENTYL) 10 MG capsule TAKE 1 CAPSULE (10 MG TOTAL) BY MOUTH 2 (TWO) TIMES DAILY AS NEEDED FOR SPASMS (ABDOMINAL PAIN).   hydrochlorothiazide (HYDRODIURIL) 25 MG tablet Take 1 tablet (25 mg total) by mouth daily.   ibuprofen (ADVIL) 800 MG tablet Take 800 mg by mouth every 8 (eight) hours as needed.   metoprolol succinate (TOPROL-XL) 25 MG 24 hr tablet Take 1 tablet (25 mg total) by mouth daily. Must keep upcoming appointment for further refills   Multiple Vitamins-Minerals (EMERGEN-C IMMUNE PLUS PO) Take 1 tablet by mouth daily.    omeprazole (PRILOSEC) 40 MG capsule Take 1 capsule (40 mg total) by mouth daily.   potassium chloride (KLOR-CON) 10 MEQ tablet Take 1 tablet (10 mEq total) by mouth daily.   [DISCONTINUED] losartan (COZAAR) 25 MG tablet Take 1 tablet (25 mg total) by mouth every other day. Must keep upcoming appointment for further refills     Review of Systems      All other systems reviewed and are otherwise negative except as noted above.  Physical Exam    VS:  BP 140/78   Pulse (!) 56   Ht '5\' 7"'$  (1.702 m)   Wt 188 lb (85.3 kg)  BMI 29.44 kg/m  , BMI Body mass index is 29.44 kg/m.  Wt Readings from Last 3 Encounters:  04/12/22 188 lb (85.3 kg)  01/11/22 188 lb 1.6 oz (85.3 kg)  01/03/22 187 lb 6.4 oz (85 kg)     GEN: Well nourished, well developed, in no acute distress. HEENT: normal. Neck: Supple, no JVD, carotid bruits, or masses. Cardiac: RRR, no murmurs, rubs, or gallops. No clubbing, cyanosis, edema.  Radials/PT 2+ and equal bilaterally.  Respiratory:  Respirations regular and unlabored, clear to auscultation bilaterally. GI: Soft, nontender, nondistended. MS: No deformity or atrophy. Skin: Warm and dry, no rash. Neuro:  Strength and sensation are intact. Psych: Normal affect.  Assessment & Plan    HTN -initial BP in clinic 156/90.  Improved to 140/78 without intervention.  Home BP cuff found to be accurate.  She monitors blood pressure routinely at home with readings often 110s over 70s.  Continue present antihypertensive regimen hydrochlorothiazide 25 mg daily, losartan 25 mg daily, Toprol 25 mg daily.  Refill for losartan provided.  Obesity - Weight loss via diet and exercise encouraged. Discussed the impact being overweight would have on cardiovascular risk.   LE edema -well-controlled on hydrochlorothiazide 25 mg daily.  Palpitations / PVC / PAC -quiescent on present dose metoprolol.  Mildly bradycardic today but asymptomatic with no lightheadedness, dizziness.  If she  becomes symptomatic could consider reduced dose.  Disposition: Follow up in 4 month(s) with Skeet Latch, MD or APP.  Signed, Loel Dubonnet, NP 04/12/2022, 9:03 AM [place]

## 2022-04-12 ENCOUNTER — Encounter (HOSPITAL_BASED_OUTPATIENT_CLINIC_OR_DEPARTMENT_OTHER): Payer: Self-pay | Admitting: Family

## 2022-04-12 ENCOUNTER — Ambulatory Visit (HOSPITAL_BASED_OUTPATIENT_CLINIC_OR_DEPARTMENT_OTHER): Payer: Medicare HMO | Admitting: Family

## 2022-04-12 VITALS — BP 140/78 | HR 56 | Ht 67.0 in | Wt 188.0 lb

## 2022-04-12 DIAGNOSIS — R002 Palpitations: Secondary | ICD-10-CM | POA: Diagnosis not present

## 2022-04-12 DIAGNOSIS — I491 Atrial premature depolarization: Secondary | ICD-10-CM | POA: Diagnosis not present

## 2022-04-12 DIAGNOSIS — R600 Localized edema: Secondary | ICD-10-CM | POA: Diagnosis not present

## 2022-04-12 DIAGNOSIS — I10 Essential (primary) hypertension: Secondary | ICD-10-CM | POA: Diagnosis not present

## 2022-04-12 DIAGNOSIS — I493 Ventricular premature depolarization: Secondary | ICD-10-CM | POA: Diagnosis not present

## 2022-04-12 MED ORDER — LOSARTAN POTASSIUM 25 MG PO TABS: 25.0000 mg | ORAL_TABLET | Freq: Every day | ORAL | 3 refills | Status: DC

## 2022-04-12 NOTE — Patient Instructions (Signed)
Medication Instructions:  Your Physician recommend you continue on your current medication as directed.    *If you need a refill on your cardiac medications before your next appointment, please call your pharmacy*   Follow-Up: At [facility], you and your health needs are our priority.  As part of our continuing mission to provide you with exceptional heart care, we have created designated Provider Care Teams.  These Care Teams include your primary Cardiologist (physician) and Advanced Practice Providers (APPs -  Physician Assistants and Nurse Practitioners) who all work together to provide you with the care you need, when you need it.  We recommend signing up for the patient portal called "MyChart".  Sign up information is provided on this After Visit Summary.  MyChart is used to connect with patients for Virtual Visits (Telemedicine).  Patients are able to view lab/test results, encounter notes, upcoming appointments, etc.  Non-urgent messages can be sent to your provider as well.   To learn more about what you can do with MyChart, go to [email or web address].    Your next appointment:   4 month(s)  The format for your next appointment:   In Person  Provider:   Skeet Latch, MD{  Other Instructions Heart Healthy Diet Recommendations: A low-salt diet is recommended. Meats should be grilled, baked, or boiled. Avoid fried foods. Focus on lean protein sources like fish or chicken with vegetables and fruits. The American Heart Association is a Microbiologist!  American Heart Association Diet and Lifeystyle Recommendations   Exercise recommendations: The American Heart Association recommends 150 minutes of moderate intensity exercise weekly. Try 30 minutes of moderate intensity exercise 4-5 times per week. This could include walking, jogging, or swimming.   Important Information About Sugar

## 2022-04-28 ENCOUNTER — Encounter (INDEPENDENT_AMBULATORY_CARE_PROVIDER_SITE_OTHER): Payer: Self-pay

## 2022-06-04 ENCOUNTER — Encounter (HOSPITAL_BASED_OUTPATIENT_CLINIC_OR_DEPARTMENT_OTHER): Payer: Self-pay | Admitting: Emergency Medicine

## 2022-06-04 ENCOUNTER — Other Ambulatory Visit: Payer: Self-pay

## 2022-06-04 ENCOUNTER — Emergency Department (HOSPITAL_BASED_OUTPATIENT_CLINIC_OR_DEPARTMENT_OTHER)
Admission: EM | Admit: 2022-06-04 | Discharge: 2022-06-05 | Disposition: A | Discharge status: Home / Self Care | Payer: Medicare HMO | Attending: Emergency Medicine | Admitting: Emergency Medicine

## 2022-06-04 ENCOUNTER — Emergency Department (HOSPITAL_BASED_OUTPATIENT_CLINIC_OR_DEPARTMENT_OTHER): Payer: Medicare HMO | Admitting: Radiology

## 2022-06-04 DIAGNOSIS — M2550 Pain in unspecified joint: Secondary | ICD-10-CM | POA: Insufficient documentation

## 2022-06-04 DIAGNOSIS — Z20822 Contact with and (suspected) exposure to covid-19: Secondary | ICD-10-CM | POA: Insufficient documentation

## 2022-06-04 DIAGNOSIS — Z91040 Latex allergy status: Secondary | ICD-10-CM | POA: Diagnosis not present

## 2022-06-04 DIAGNOSIS — M329 Systemic lupus erythematosus, unspecified: Secondary | ICD-10-CM | POA: Diagnosis not present

## 2022-06-04 DIAGNOSIS — R112 Nausea with vomiting, unspecified: Secondary | ICD-10-CM | POA: Diagnosis not present

## 2022-06-04 DIAGNOSIS — R9431 Abnormal electrocardiogram [ECG] [EKG]: Secondary | ICD-10-CM | POA: Diagnosis not present

## 2022-06-04 DIAGNOSIS — R079 Chest pain, unspecified: Secondary | ICD-10-CM | POA: Diagnosis not present

## 2022-06-04 LAB — BASIC METABOLIC PANEL
Anion gap: 11 (ref 5–15)
BUN: 13 mg/dL (ref 8–23)
CO2: 24 mmol/L (ref 22–32)
Calcium: 10.3 mg/dL (ref 8.9–10.3)
Chloride: 101 mmol/L (ref 98–111)
Creatinine, Ser: 0.85 mg/dL (ref 0.44–1.00)
GFR, Estimated: >60 mL/min (ref >60)
Glucose, Bld: 118 mg/dL — ABNORMAL HIGH (ref 70–99)
Potassium: 3.4 mmol/L — ABNORMAL LOW (ref 3.5–5.1)
Sodium: 136 mmol/L (ref 135–145)

## 2022-06-04 LAB — CBC
HCT: 37.2 % (ref 36.0–46.0)
Hemoglobin: 12.7 g/dL (ref 12.0–15.0)
MCH: 31.3 pg (ref 26.0–34.0)
MCHC: 34.1 g/dL (ref 30.0–36.0)
MCV: 91.6 fL (ref 80.0–100.0)
Platelets: 188 10*3/uL (ref 150–400)
RBC: 4.06 MIL/uL (ref 3.87–5.11)
RDW: 12.4 % (ref 11.5–15.5)
WBC: 9.5 10*3/uL (ref 4.0–10.5)
nRBC: 0 % (ref 0.0–0.2)

## 2022-06-04 LAB — TROPONIN I (HIGH SENSITIVITY): Troponin I (High Sensitivity): 4 ng/L (ref <18)

## 2022-06-04 NOTE — ED Triage Notes (Signed)
Pt BIB EMS for lupus flare up. Pt c/o chest pain, joint pain, and headache since 1830 today.

## 2022-06-05 LAB — CK: Total CK: 115 U/L (ref 38–234)

## 2022-06-05 LAB — HEPATIC FUNCTION PANEL
ALT: 12 U/L (ref 0–44)
AST: 19 U/L (ref 15–41)
Albumin: 3.9 g/dL (ref 3.5–5.0)
Alkaline Phosphatase: 67 U/L (ref 38–126)
Bilirubin, Direct: 0.1 mg/dL (ref 0.0–0.2)
Indirect Bilirubin: 0.3 mg/dL (ref 0.3–0.9)
Total Bilirubin: 0.4 mg/dL (ref 0.3–1.2)
Total Protein: 6.8 g/dL (ref 6.5–8.1)

## 2022-06-05 LAB — LIPASE, BLOOD: Lipase: <10 U/L — ABNORMAL LOW (ref 11–51)

## 2022-06-05 LAB — LACTIC ACID, PLASMA: Lactic Acid, Venous: 0.9 mmol/L (ref 0.5–1.9)

## 2022-06-05 LAB — SARS CORONAVIRUS 2 BY RT PCR: SARS Coronavirus 2 by RT PCR: NEGATIVE

## 2022-06-05 MED ORDER — PREDNISONE 50 MG PO TABS: 50.0000 mg | ORAL_TABLET | Freq: Every day | ORAL | 0 refills | Status: DC

## 2022-06-05 MED ORDER — FENTANYL CITRATE PF 50 MCG/ML IJ SOSY
100.0000 ug | PREFILLED_SYRINGE | Freq: Once | INTRAMUSCULAR | Status: AC
  Administered 2022-06-05: 100 ug via INTRAVENOUS
  Filled 2022-06-05: qty 2

## 2022-06-05 MED ORDER — SODIUM CHLORIDE 0.9 % IV BOLUS
1000.0000 mL | Freq: Once | INTRAVENOUS | Status: AC
  Administered 2022-06-05: 1000 mL via INTRAVENOUS

## 2022-06-05 MED ORDER — PREDNISONE 50 MG PO TABS
60.0000 mg | ORAL_TABLET | Freq: Once | ORAL | Status: AC
  Administered 2022-06-05: 60 mg via ORAL
  Filled 2022-06-05: qty 1

## 2022-06-05 MED ORDER — HYDROCODONE-ACETAMINOPHEN 5-325 MG PO TABS: 1.0000 | ORAL_TABLET | Freq: Four times a day (QID) | ORAL | 0 refills | Status: DC | PRN

## 2022-06-05 MED ORDER — ONDANSETRON HCL 4 MG/2ML IJ SOLN
4.0000 mg | Freq: Once | INTRAMUSCULAR | Status: AC
  Administered 2022-06-05: 4 mg via INTRAVENOUS
  Filled 2022-06-05: qty 2

## 2022-06-05 NOTE — ED Provider Notes (Signed)
[facility] EMERGENCY DEPT Provider Note   CSN: [id number] Arrival date & time: 06/04/22  2145     History  Chief Complaint  Patient presents with   Chest Pain    [name] is a 71 y.o. female.  The history is provided by the patient.  Patient reports she is having a lupus flareup.  This is started over the past day.  She reports diffuse joint pain.  She is also feeling pressure on her chest.  She reports nausea and vomiting.  No fevers.  No new weakness.  She reports similar episodes in the past but this feels worse. She reports at times will respond to oral prednisone     Home Medications Prior to Admission medications   Medication Sig Start Date End Date Taking? Authorizing Provider  HYDROcodone-acetaminophen (NORCO/VICODIN) 5-325 MG tablet Take 1 tablet by mouth every 6 (six) hours as needed for severe pain. 06/05/22  Yes Ripley Fraise, MD  predniSONE (DELTASONE) 50 MG tablet Take 1 tablet (50 mg total) by mouth daily. 06/05/22  Yes Ripley Fraise, MD  cholecalciferol (VITAMIN D) 1000 units tablet Take 1,000 Units by mouth daily.    [provider]  dicyclomine (BENTYL) 10 MG capsule TAKE 1 CAPSULE (10 MG TOTAL) BY MOUTH 2 (TWO) TIMES DAILY AS NEEDED FOR SPASMS (ABDOMINAL PAIN). 09/25/21   Noralyn Pick, NP  hydrochlorothiazide (HYDRODIURIL) 25 MG tablet Take 1 tablet (25 mg total) by mouth daily. 01/25/22   Skeet Latch, MD  ibuprofen (ADVIL) 800 MG tablet Take 800 mg by mouth every 8 (eight) hours as needed.    [provider]  losartan (COZAAR) 25 MG tablet Take 1 tablet (25 mg total) by mouth daily. 04/12/22   Loel Dubonnet, NP  metoprolol succinate (TOPROL-XL) 25 MG 24 hr tablet Take 1 tablet (25 mg total) by mouth daily. Must keep upcoming appointment for further refills 10/05/21   Skeet Latch, MD  Multiple Vitamins-Minerals (EMERGEN-C IMMUNE PLUS PO) Take 1 tablet by mouth daily.    [provider]   omeprazole (PRILOSEC) 40 MG capsule Take 1 capsule (40 mg total) by mouth daily. 02/17/18   Magrinat, Virgie Dad, MD  potassium chloride (KLOR-CON) 10 MEQ tablet Take 1 tablet (10 mEq total) by mouth daily. 07/22/21   Hoyt Koch, MD      Allergies    Iodine, Iohexol, Latex, Penicillins, Tetracycline, Other, Penicillin g, and Sulfa antibiotics    Review of Systems   Review of Systems  Constitutional:  Negative for fever.  Cardiovascular:  Positive for chest pain.  Gastrointestinal:  Positive for nausea and vomiting.  Musculoskeletal:  Positive for arthralgias. Negative for joint swelling.    Physical Exam Updated Vital Signs BP (!) 115/59   Pulse 89   Temp 98.2 F (36.8 C)   Resp (!) 22   Ht 1.702 m ('5\' 7"'$ )   Wt 81.6 kg   SpO2 99%   BMI 28.19 kg/m  Physical Exam CONSTITUTIONAL: Elderly, uncomfortable appearing HEAD: Normocephalic/atraumatic EYES: EOMI/PERRL ENMT: Mucous membranes moist NECK: supple no meningeal signs SPINE/BACK:entire spine nontender CV: S1/S2 noted, no murmurs/rubs/gallops noted LUNGS: Lungs are clear to auscultation bilaterally, no apparent distress ABDOMEN: soft, nontender NEURO: Pt is awake/alert/appropriate, moves all extremitiesx4.  No facial droop.  No focal weakness EXTREMITIES: pulses normal/equal, full ROM, diffuse tenderness noted to upper and lower extremities.  There is no joint effusions.  No lower extremity edema SKIN: warm, color normal PSYCH: Anxious ED Results / Procedures /  Treatments   Labs (all labs ordered are listed, but only abnormal results are displayed) Labs Reviewed  BASIC METABOLIC PANEL - Abnormal; Notable for the following components:      Result Value   Potassium 3.4 (*)    Glucose, Bld 118 (*)    All other components within normal limits  LIPASE, BLOOD - Abnormal; Notable for the following components:   Lipase <10 (*)    All other components within normal limits  CBC  HEPATIC FUNCTION PANEL  CK  LACTIC  ACID, PLASMA  TROPONIN I (HIGH SENSITIVITY)    EKG EKG Interpretation  Date/Time:  Friday June 04 2022 21:55:41 EDT Ventricular Rate:  89 PR Interval:  168 QRS Duration: 74 QT Interval:  356 QTC Calculation: 433 R Axis:   13 Text Interpretation: Normal sinus rhythm Nonspecific T wave abnormality Abnormal ECG Interpretation limited secondary to artifact Confirmed by Ripley Fraise [phone]) on 06/05/2022 12:11:55 AM  Radiology DG Chest 2 View  Result Date: 06/04/2022 CLINICAL DATA:  Chest pain EXAM: CHEST - 2 VIEW COMPARISON:  06/30/2021 FINDINGS: The heart size and mediastinal contours are within normal limits. Both lungs are clear. Biapical scarring is again noted. The visualized skeletal structures are unremarkable. IMPRESSION: No active cardiopulmonary disease. Electronically Signed   By: Inez Catalina M.D.   On: 06/04/2022 22:27    Procedures Procedures    Medications Ordered in ED Medications  fentaNYL (SUBLIMAZE) injection 100 mcg (100 mcg Intravenous Given 06/05/22 0028)  predniSONE (DELTASONE) tablet 60 mg (60 mg Oral Given 06/05/22 0032)  ondansetron (ZOFRAN) injection 4 mg (4 mg Intravenous Given 06/05/22 0029)  sodium chloride 0.9 % bolus 1,000 mL (0 mLs Intravenous Stopped 06/05/22 0130)    ED Course/ Medical Decision Making/ A&P Clinical Course as of 06/05/22 0222  Sat Jun 05, 2022  0121 Patient reports feeling improved.  Labs overall reassuring [DW]  0220 Patient overall feeling improved.  Labs overall unremarkable.  Vital signs appropriate.  She feels comfortable for discharge home.  She typically takes about a 7-day course of prednisone when she has these flares. [DW]  0220 Will need an additional 6 days of prednisone, pain medicine, follow-up with PCP We discussed strict return precautions [DW]    Clinical Course User Index [DW] Ripley Fraise, MD                           Medical Decision Making Amount and/or Complexity of Data Reviewed Labs:  ordered. Radiology: ordered.  Risk Prescription drug management.   This patient presents to the ED for concern of joint pain, this involves an extensive number of treatment options, and is a complaint that carries with it a high risk of complications and morbidity.  The differential diagnosis includes but is not limited to arthralgias, joint effusion, septic joint, rhabdomyolysis  Comorbidities that complicate the patient evaluation: Patient's presentation is complicated by their history of lupus   Additional history obtained: Records reviewed  cardiology notes reviewed  Lab Tests: I Ordered, and personally interpreted labs.  The pertinent results include: Labs unremarkable  Imaging Studies ordered: I ordered imaging studies including X-ray chest   I independently visualized and interpreted imaging which showed no acute findings I agree with the radiologist interpretation   Medicines ordered and prescription drug management: I ordered medication including prednisone, fentanyl for pain Reevaluation of the patient after these medicines showed that the patient    improved   Critical Interventions:  Prednisone  Reevaluation: After the interventions noted above, I reevaluated the patient and found that they have :improved  Complexity of problems addressed: Patient's presentation is most consistent with  acute presentation with potential threat to life or bodily function  Disposition: After consideration of the diagnostic results and the patient's response to treatment,  I feel that the patent would benefit from discharge   .           Final Clinical Impression(s) / ED Diagnoses Final diagnoses:  Lupus ([place])  Arthralgia, unspecified joint    Rx / DC Orders ED Discharge Orders          Ordered    predniSONE (DELTASONE) 50 MG tablet  Daily        06/05/22 0222    HYDROcodone-acetaminophen (NORCO/VICODIN) 5-325 MG tablet  Every 6 hours PRN        06/05/22  Mervyn Gay, MD 06/05/22 [phone]

## 2022-06-05 NOTE — ED Notes (Signed)
Pt transferred from bed to bsc w/ minimal assist

## 2022-06-05 NOTE — ED Notes (Signed)
RN to d/c pt. Pt's temp noted to be 100.6. Dr. Christy Gentles notified. Verbal order given for covid swab. Pt opted for swab. Swab sent. Pt to follow up with EMR.

## 2022-06-05 NOTE — ED Provider Notes (Signed)
I was informed by nursing that patient had a temperature of 100.6 at time of discharge.  She was otherwise feeling well and no other signs of infection.  COVID-19 testing was offered.  Patient is not septic appearing.  She can follow-up with [name] as an outpatient.  Patient is safe for discharge   Ripley Fraise, MD 06/05/22 [phone]

## 2022-06-21 ENCOUNTER — Ambulatory Visit
Admission: RE | Admit: 2022-06-21 | Discharge: 2022-06-21 | Disposition: A | Discharge status: Home / Self Care | Payer: Medicare HMO | Source: Ambulatory Visit | Attending: Hematology and Oncology | Admitting: Hematology and Oncology

## 2022-06-21 DIAGNOSIS — C50411 Malignant neoplasm of upper-outer quadrant of right female breast: Secondary | ICD-10-CM

## 2022-06-23 ENCOUNTER — Other Ambulatory Visit: Payer: Self-pay | Admitting: Hematology and Oncology

## 2022-06-23 DIAGNOSIS — Z170 Estrogen receptor positive status [ER+]: Secondary | ICD-10-CM

## 2022-06-23 NOTE — Progress Notes (Signed)
Patient complained of breast pain, wondering if we can order diagnostic mammogram instead of screening. This is appropriate given history, ordered.

## 2022-06-24 ENCOUNTER — Other Ambulatory Visit: Payer: Self-pay | Admitting: Hematology and Oncology

## 2022-06-24 DIAGNOSIS — Z170 Estrogen receptor positive status [ER+]: Secondary | ICD-10-CM

## 2022-06-29 ENCOUNTER — Other Ambulatory Visit: Payer: Self-pay

## 2022-06-29 ENCOUNTER — Inpatient Hospital Stay: Payer: Medicare HMO

## 2022-06-29 ENCOUNTER — Inpatient Hospital Stay: Payer: Medicare HMO | Attending: Hematology and Oncology | Admitting: Hematology and Oncology

## 2022-06-29 VITALS — BP 131/77 | HR 70 | Temp 97.9°F | Resp 16 | Ht 67.0 in | Wt 187.1 lb

## 2022-06-29 DIAGNOSIS — Z888 Allergy status to other drugs, medicaments and biological substances status: Secondary | ICD-10-CM | POA: Diagnosis not present

## 2022-06-29 DIAGNOSIS — N644 Mastodynia: Secondary | ICD-10-CM | POA: Insufficient documentation

## 2022-06-29 DIAGNOSIS — Z923 Personal history of irradiation: Secondary | ICD-10-CM | POA: Diagnosis not present

## 2022-06-29 DIAGNOSIS — Z882 Allergy status to sulfonamides status: Secondary | ICD-10-CM | POA: Insufficient documentation

## 2022-06-29 DIAGNOSIS — Z79899 Other long term (current) drug therapy: Secondary | ICD-10-CM | POA: Insufficient documentation

## 2022-06-29 DIAGNOSIS — Z85038 Personal history of other malignant neoplasm of large intestine: Secondary | ICD-10-CM | POA: Diagnosis not present

## 2022-06-29 DIAGNOSIS — Z8261 Family history of arthritis: Secondary | ICD-10-CM | POA: Diagnosis not present

## 2022-06-29 DIAGNOSIS — Z170 Estrogen receptor positive status [ER+]: Secondary | ICD-10-CM | POA: Diagnosis not present

## 2022-06-29 DIAGNOSIS — Z823 Family history of stroke: Secondary | ICD-10-CM | POA: Diagnosis not present

## 2022-06-29 DIAGNOSIS — J984 Other disorders of lung: Secondary | ICD-10-CM | POA: Diagnosis not present

## 2022-06-29 DIAGNOSIS — Z818 Family history of other mental and behavioral disorders: Secondary | ICD-10-CM | POA: Insufficient documentation

## 2022-06-29 DIAGNOSIS — Z803 Family history of malignant neoplasm of breast: Secondary | ICD-10-CM | POA: Diagnosis not present

## 2022-06-29 DIAGNOSIS — I10 Essential (primary) hypertension: Secondary | ICD-10-CM | POA: Insufficient documentation

## 2022-06-29 DIAGNOSIS — R310 Gross hematuria: Secondary | ICD-10-CM

## 2022-06-29 DIAGNOSIS — Z87440 Personal history of urinary (tract) infections: Secondary | ICD-10-CM | POA: Insufficient documentation

## 2022-06-29 DIAGNOSIS — Z881 Allergy status to other antibiotic agents status: Secondary | ICD-10-CM | POA: Insufficient documentation

## 2022-06-29 DIAGNOSIS — C50411 Malignant neoplasm of upper-outer quadrant of right female breast: Secondary | ICD-10-CM | POA: Insufficient documentation

## 2022-06-29 DIAGNOSIS — Z8719 Personal history of other diseases of the digestive system: Secondary | ICD-10-CM | POA: Insufficient documentation

## 2022-06-29 DIAGNOSIS — Z800 Family history of malignant neoplasm of digestive organs: Secondary | ICD-10-CM | POA: Diagnosis not present

## 2022-06-29 DIAGNOSIS — Z9049 Acquired absence of other specified parts of digestive tract: Secondary | ICD-10-CM | POA: Insufficient documentation

## 2022-06-29 DIAGNOSIS — Z79810 Long term (current) use of selective estrogen receptor modulators (SERMs): Secondary | ICD-10-CM | POA: Insufficient documentation

## 2022-06-29 DIAGNOSIS — Z880 Allergy status to penicillin: Secondary | ICD-10-CM | POA: Diagnosis not present

## 2022-06-29 DIAGNOSIS — Z841 Family history of disorders of kidney and ureter: Secondary | ICD-10-CM | POA: Diagnosis not present

## 2022-06-29 DIAGNOSIS — Z8249 Family history of ischemic heart disease and other diseases of the circulatory system: Secondary | ICD-10-CM | POA: Diagnosis not present

## 2022-06-29 LAB — CMP (CANCER CENTER ONLY)
ALT: 16 U/L (ref 0–44)
AST: 22 U/L (ref 15–41)
Albumin: 3.9 g/dL (ref 3.5–5.0)
Alkaline Phosphatase: 70 U/L (ref 38–126)
Anion gap: 3 — ABNORMAL LOW (ref 5–15)
BUN: 20 mg/dL (ref 8–23)
CO2: 29 mmol/L (ref 22–32)
Calcium: 9.7 mg/dL (ref 8.9–10.3)
Chloride: 105 mmol/L (ref 98–111)
Creatinine: 0.78 mg/dL (ref 0.44–1.00)
GFR, Estimated: >60 mL/min (ref >60)
Glucose, Bld: 71 mg/dL (ref 70–99)
Potassium: 3.3 mmol/L — ABNORMAL LOW (ref 3.5–5.1)
Sodium: 137 mmol/L (ref 135–145)
Total Bilirubin: 0.5 mg/dL (ref 0.3–1.2)
Total Protein: 7.6 g/dL (ref 6.5–8.1)

## 2022-06-29 LAB — URINALYSIS, COMPLETE (UACMP) WITH MICROSCOPIC
Bacteria, UA: NONE SEEN
Bilirubin Urine: NEGATIVE
Glucose, UA: NEGATIVE mg/dL
Hgb urine dipstick: NEGATIVE
Ketones, ur: NEGATIVE mg/dL
Leukocytes,Ua: NEGATIVE
Nitrite: NEGATIVE
Protein, ur: NEGATIVE mg/dL
Specific Gravity, Urine: 1.021 (ref 1.005–1.030)
pH: 6 (ref 5.0–8.0)

## 2022-06-29 LAB — CBC WITH DIFFERENTIAL (CANCER CENTER ONLY)
Abs Immature Granulocytes: 0.01 10*3/uL (ref 0.00–0.07)
Basophils Absolute: 0 10*3/uL (ref 0.0–0.1)
Basophils Relative: 1 %
Eosinophils Absolute: 0.3 10*3/uL (ref 0.0–0.5)
Eosinophils Relative: 7 %
HCT: 38.2 % (ref 36.0–46.0)
Hemoglobin: 12.7 g/dL (ref 12.0–15.0)
Immature Granulocytes: 0 %
Lymphocytes Relative: 47 %
Lymphs Abs: 2.3 10*3/uL (ref 0.7–4.0)
MCH: 31.4 pg (ref 26.0–34.0)
MCHC: 33.2 g/dL (ref 30.0–36.0)
MCV: 94.6 fL (ref 80.0–100.0)
Monocytes Absolute: 0.6 10*3/uL (ref 0.1–1.0)
Monocytes Relative: 13 %
Neutro Abs: 1.6 10*3/uL — ABNORMAL LOW (ref 1.7–7.7)
Neutrophils Relative %: 32 %
Platelet Count: 199 10*3/uL (ref 150–400)
RBC: 4.04 MIL/uL (ref 3.87–5.11)
RDW: 12.7 % (ref 11.5–15.5)
WBC Count: 4.9 10*3/uL (ref 4.0–10.5)
nRBC: 0 % (ref 0.0–0.2)

## 2022-06-29 NOTE — Progress Notes (Signed)
Pt requests UA as she is having sx of UTI. Per MD OK to enter orders.

## 2022-06-29 NOTE — Progress Notes (Signed)
[name]  [phone] Fax:(336) [phone]     ID: [name] DOB: 1951/06/28  MR#: [id number]  MWN#:[id number]  Patient Care Team: Janith Lima, MD as PCP - General (Internal Medicine) Skeet Latch, MD as PCP - Cardiology (Cardiology) Gavin Pound, MD as Consulting Physician (Rheumatology) Willia Craze, NP as Nurse Practitioner (Gastroenterology) Magrinat, Virgie Dad, MD (Inactive) as Consulting Physician (Oncology) Leanora Cover, MD as Consulting Physician (Orthopedic Surgery) OTHER MD:   CHIEF COMPLAINT: Estrogen receptor positive breast cancer  CURRENT TREATMENT: tamoxifen    INTERVAL HISTORY:  [name] returns today for follow-up of her estrogen receptor positive breast cancer.  She completed 10 years of tamoxifen and is here for follow-up.   Since last visit, she has noticed some new pain in the right breast right behind the surgical scar.  She denies any palpable lumps.  This pain started around 2 to 3 weeks ago.  She also had COVID about 3 weeks ago and has recovered thankfully well.  No nipple discharge noted.  She is due for diagnostic mammogram next week. Rest of the pertinent 10 point ROS reviewed and negative. COVID 19 VACCINATION STATUS: [place] Nguyen x3, most recently 07/2020; infection 03/25/2021   BREAST CANCER HISTORY: From Dr. Dana Allan earlier summary:  "#1 patient is status post lumpectomy with sentinel lymph node biopsy followed by radiation to the right breast. In December 2009 patient began aromatase inhibitor but could not tolerate it.  #2 therefore she began in March 2010 tamoxifen 20 mg daily. However she has experienced significant amount of aches and pains and in December 2012 discontinued it. At this time she does not want to go back on any kind of antiestrogen therapy due to side effects.  #3 patient would like to go back on tamoxifen 20 mg daily. She recently attended finding your new normal seminar. And  because of that she wants to she would like to go back on the tamoxifen. We discussed side effects again. She was sent to her pharmacy."  Her subsequent history is as detailed below   PAST MEDICAL HISTORY: Past Medical History:  Diagnosis Date   Arthritis    R knee, hands    Back pain    Blood transfusion    1986- post-partial hysterectomy   Breast cancer ([place]) 2009   right lumpectomy and radiation   Cataract    Chicken pox    Colon cancer ([place]) 2003   per pt report, cancer found in a colon polyp in 2003, procedure done in [place].  did not require surgery, chemo etc.     Depression    Diverticulitis    Edema of both ankles    Family history of breast cancer 10/24/2017   Gallbladder problem    Gastritis    GERD (gastroesophageal reflux disease)    recently taken off anti reflux tx   Hepatitis    Hep. A- 1978   Hiatal hernia    History of stomach ulcers    Hypertension    Joint pain    Lactose intolerance    Lower extremity edema 01/11/2022   Lupus ([place])    Multiple gastric ulcers    Palpitations    Personal history of radiation therapy 2009   Rheumatic fever    Rheumatoid arthritis (HCC)    SLE (systemic lupus erythematosus related syndrome) (HCC)    Urinary incontinence    UTI (urinary tract infection)     PAST SURGICAL HISTORY: Past Surgical History:  Procedure Laterality  Date   ABDOMINAL HYSTERECTOMY     1986   APPENDECTOMY     1978 & tubal ligation    BIOPSY  07/25/2018   Procedure: BIOPSY;  Surgeon: Milus Banister, MD;  Location: [facility] ENDOSCOPY;  Service: Endoscopy;;   BREAST EXCISIONAL BIOPSY Left 1998   BREAST LUMPECTOMY Bilateral    2009   BREAST SURGERY     R breast lumpectomy   CHOLECYSTECTOMY     1978- open    ESOPHAGOGASTRODUODENOSCOPY (EGD) WITH PROPOFOL N/A 07/25/2018   Procedure: ESOPHAGOGASTRODUODENOSCOPY (EGD) WITH PROPOFOL;  Surgeon: Milus Banister, MD;  Location: [facility] ENDOSCOPY;  Service: Endoscopy;  Laterality: N/A;   REPLACEMENT TOTAL  KNEE Left    TONSILLECTOMY     1954   TOTAL KNEE ARTHROPLASTY  08/30/2011   Procedure: TOTAL KNEE ARTHROPLASTY;  Surgeon: Kerin Salen;  Location: [place];  Service: Orthopedics;  Laterality: Right;  Right Total Knee Arthroplasty   TUBAL LIGATION      FAMILY HISTORY Family History  Problem Relation Age of Onset   Breast cancer Mother 47   Hypertension Mother    Stroke Mother    Depression Mother    Heart attack Father    Other Father    Heart disease Father    Sudden death Father    Hypertension Sister    Hypertension Brother    Kidney disease Brother    Other Maternal Grandmother 31       brain tumor dx at 34- did not bx or do additional testing on tumor   Arthritis Maternal Grandmother    Cancer Paternal Grandmother 68       type unk   Breast cancer Maternal Aunt 24   Cancer Paternal Aunt 65       type unk   Cancer Other    Hypertension Other    Anesthesia problems Neg Hx    Hypotension Neg Hx    Malignant hyperthermia Neg Hx    Pseudochol deficiency Neg Hx    Colon cancer Neg Hx    Esophageal cancer Neg Hx    Stomach cancer Neg Hx    Pancreatic cancer Neg Hx    Liver disease Neg Hx    the patient's father died from a heart attack at the age of 30. The patient's mother died at the age of 64. She had a history of breast cancer and one of her sisters was diagnosed with breast cancer at the age of 4. The patient had 5 brothers and 5 sisters. There is no other history of breast or ovarian cancer in the family to her knowledge.   GYNECOLOGIC HISTORY:  No LMP recorded. Patient has had a hysterectomy. Menarche age 35, first live birth age 83. The patient is GX P3. She had a simple hysterectomy in 1986 (no salpingo-oophorectomy).   SOCIAL HISTORY: (Updated February 2022). 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  She remarried in 2011. Her  husband [name] is retired from [employer]. The patient has one adopted son in addition to her own children. 3 of her children live in town 1 in [place]. She has 7 grandchildren. She attends [employer] locally.    ADVANCED DIRECTIVES: Not in place   HEALTH MAINTENANCE: Social History   Tobacco Use   Smoking status: Never   Smokeless  tobacco: Never  Vaping Use   Vaping Use: Never used  Substance Use Topics   Alcohol use: No   Drug use: No     Colonoscopy:  PAP:  Bone density: 04/30/2008 was normal  Lipid panel:  Allergies  Allergen Reactions   Iodine Anaphylaxis   Iohexol Anaphylaxis     Code: HIVES, Desc: PT IS ALLERGIC TO IODINATED CONTRAST; REACTION INCLUDES FACIAL SWELLING,HIVES,RESPIRATORY DISTRESS;PT HASN'T HAD SCAN W/PREMEDS.;REFUSED CONTRAST OF ANY KIND!  KR    Latex Anaphylaxis   Penicillins Anaphylaxis and Rash    Has patient had a PCN reaction causing immediate rash, facial/tongue/throat swelling, SOB or lightheadedness with hypotension: Yes Has patient had a PCN reaction causing severe rash involving mucus membranes or skin necrosis: Yes Has patient had a PCN reaction that required hospitalization: No Has patient had a PCN reaction occurring within the last 10 years: No If all of the above answers are "NO", then may proceed with Cephalosporin use.    Tetracycline Anaphylaxis   Other Other (See Comments)   Penicillin G Other (See Comments)   Sulfa Antibiotics Hives    Current Outpatient Medications  Medication Sig Dispense Refill   cholecalciferol (VITAMIN D) 1000 units tablet Take 1,000 Units by mouth daily.     dicyclomine (BENTYL) 10 MG capsule TAKE 1 CAPSULE (10 MG TOTAL) BY MOUTH 2 (TWO) TIMES DAILY AS NEEDED FOR SPASMS (ABDOMINAL PAIN). 30 capsule 0   hydrochlorothiazide (HYDRODIURIL) 25 MG tablet Take 1 tablet (25 mg total) by mouth daily. 90 tablet 3   HYDROcodone-acetaminophen (NORCO/VICODIN) 5-325 MG tablet Take 1  tablet by mouth every 6 (six) hours as needed for severe pain. 10 tablet 0   ibuprofen (ADVIL) 800 MG tablet Take 800 mg by mouth every 8 (eight) hours as needed.     losartan (COZAAR) 25 MG tablet Take 1 tablet (25 mg total) by mouth daily. 90 tablet 3   metoprolol succinate (TOPROL-XL) 25 MG 24 hr tablet Take 1 tablet (25 mg total) by mouth daily. Must keep upcoming appointment for further refills 90 tablet 0   Multiple Vitamins-Minerals (EMERGEN-C IMMUNE PLUS PO) Take 1 tablet by mouth daily.     omeprazole (PRILOSEC) 40 MG capsule Take 1 capsule (40 mg total) by mouth daily. 30 capsule 5   potassium chloride (KLOR-CON) 10 MEQ tablet Take 1 tablet (10 mEq total) by mouth daily. 90 tablet 1   predniSONE (DELTASONE) 50 MG tablet Take 1 tablet (50 mg total) by mouth daily. 6 tablet 0   No current facility-administered medications for this visit.    OBJECTIVE: African-American woman in no acute distress  Vitals:   06/29/22 1343  BP: 131/77  Pulse: 70  Resp: 16  Temp: 97.9 F (36.6 C)  SpO2: 99%      Body mass index is 29.3 kg/m.    ECOG FS:1 - Symptomatic but completely ambulatory   Physical Exam Constitutional:      Appearance: Normal appearance.  Cardiovascular:     Rate and Rhythm: Normal rate and regular rhythm.  Chest:     Comments: Palpable area of likely fat necrosis noted in the right breast right behind the surgical scar measuring under centimeter.  No other palpable mass.  No other concerning changes.  No regional adenopathy. Musculoskeletal:     Cervical back: Normal range of motion and neck supple. No rigidity.  Lymphadenopathy:     Cervical: No cervical adenopathy.  Neurological:     Mental Status: She is alert.  LAB RESULTS:  CMP     Component Value Date/Time   NA 136 06/04/2022 2150   NA 140 09/04/2020 1037   NA 141 01/26/2017 1508   K 3.4 (L) 06/04/2022 2150   K 3.4 (L) 01/26/2017 1508   CL 101 06/04/2022 2150   CL 102 01/25/2013 0909   CO2  24 06/04/2022 2150   CO2 29 01/26/2017 1508   GLUCOSE 118 (H) 06/04/2022 2150   GLUCOSE 91 01/26/2017 1508   GLUCOSE 81 01/25/2013 0909   BUN 13 06/04/2022 2150   BUN 14 09/04/2020 1037   BUN 11.5 01/26/2017 1508   CREATININE 0.85 06/04/2022 2150   CREATININE 0.66 12/28/2021 1325   CREATININE 0.8 01/26/2017 1508   CALCIUM 10.3 06/04/2022 2150   CALCIUM 9.5 01/26/2017 1508   PROT 6.8 06/05/2022 0034   PROT 7.1 01/26/2017 1508   ALBUMIN 3.9 06/05/2022 0034   ALBUMIN 3.5 01/26/2017 1508   AST 19 06/05/2022 0034   AST 29 12/28/2021 1325   AST 22 01/26/2017 1508   ALT 12 06/05/2022 0034   ALT 20 12/28/2021 1325   ALT 17 01/26/2017 1508   ALKPHOS 67 06/05/2022 0034   ALKPHOS 72 01/26/2017 1508   BILITOT 0.4 06/05/2022 0034   BILITOT 0.4 12/28/2021 1325   BILITOT 0.38 01/26/2017 1508   GFRNONAA >60 06/04/2022 2150   GFRNONAA >60 12/28/2021 1325   GFRAA 91 09/04/2020 1037   GFRAA >60 02/15/2019 1436    INo results found for: "SPEP", "UPEP"  Lab Results  Component Value Date   WBC 4.9 06/29/2022   NEUTROABS 1.6 (L) 06/29/2022   HGB 12.7 06/29/2022   HCT 38.2 06/29/2022   MCV 94.6 06/29/2022   PLT 199 06/29/2022      Chemistry      Component Value Date/Time   NA 136 06/04/2022 2150   NA 140 09/04/2020 1037   NA 141 01/26/2017 1508   K 3.4 (L) 06/04/2022 2150   K 3.4 (L) 01/26/2017 1508   CL 101 06/04/2022 2150   CL 102 01/25/2013 0909   CO2 24 06/04/2022 2150   CO2 29 01/26/2017 1508   BUN 13 06/04/2022 2150   BUN 14 09/04/2020 1037   BUN 11.5 01/26/2017 1508   CREATININE 0.85 06/04/2022 2150   CREATININE 0.66 12/28/2021 1325   CREATININE 0.8 01/26/2017 1508      Component Value Date/Time   CALCIUM 10.3 06/04/2022 2150   CALCIUM 9.5 01/26/2017 1508   ALKPHOS 67 06/05/2022 0034   ALKPHOS 72 01/26/2017 1508   AST 19 06/05/2022 0034   AST 29 12/28/2021 1325   AST 22 01/26/2017 1508   ALT 12 06/05/2022 0034   ALT 20 12/28/2021 1325   ALT 17 01/26/2017 1508    BILITOT 0.4 06/05/2022 0034   BILITOT 0.4 12/28/2021 1325   BILITOT 0.38 01/26/2017 1508       Lab Results  Component Value Date   LABCA2 16 11/09/2010    No components found for: "LABCA125"  No results for input(s): "INR" in the last 168 hours.  Urinalysis    Component Value Date/Time   COLORURINE YELLOW 12/28/2021 1325   APPEARANCEUR CLEAR 12/28/2021 1325   LABSPEC 1.017 12/28/2021 1325   LABSPEC 1.015 01/26/2017 1523   PHURINE 6.0 12/28/2021 1325   GLUCOSEU NEGATIVE 12/28/2021 1325   GLUCOSEU NEGATIVE 09/17/2021 1556   GLUCOSEU Negative 01/26/2017 1523   HGBUR NEGATIVE 12/28/2021 1325   HGBUR negative 01/22/2010 [place] 12/28/2021 1325  BILIRUBINUR Negative 01/26/2017 1523   KETONESUR NEGATIVE 12/28/2021 1325   PROTEINUR NEGATIVE 12/28/2021 1325   UROBILINOGEN 0.2 09/17/2021 1556   UROBILINOGEN 0.2 01/26/2017 1523   NITRITE NEGATIVE 12/28/2021 1325   LEUKOCYTESUR NEGATIVE 12/28/2021 1325   LEUKOCYTESUR Small 01/26/2017 1523    STUDIES: DG Chest 2 View  Result Date: 06/04/2022 CLINICAL DATA:  Chest pain EXAM: CHEST - 2 VIEW COMPARISON:  06/30/2021 FINDINGS: The heart size and mediastinal contours are within normal limits. Both lungs are clear. Biapical scarring is again noted. The visualized skeletal structures are unremarkable. IMPRESSION: No active cardiopulmonary disease. Electronically Signed   By: Inez Catalina M.D.   On: 06/04/2022 22:27      ASSESSMENT: 71 y.o. 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  (2) status post right lumpectomy 03/20/2008 for invasive lobular carcinoma, multifocal, pT1a pNX, stage IA, grade 2, strongly estrogen and progesterone receptor positive, HER-2 negative. with negative margins.  (3) status post right axillary sentinel lymph node sampling  04/17/2008, the single sentinel lymph node being clear  (4) Oncotype score of 10 predicts an outside the breast risk of recurrence of 7% if the patient's only systemic therapy is tamoxifen for 5 years.  (5) Additional right breast biopsy 05/23/2008 to evaluate microcalcifications seen on pre-radiotherapy mammography showed only atypical lobular hyperplasia  (6) Completed radiation to the right breast 07/26/2008 (50 gray +14 gray boost).  (7) Started on anastrozole November 2009, with poor tolerance  (8). Started on tamoxifen March 2010, continued to December 2012, resumed December 2014   (a) tamoxifen discontinued February 2022  (9) status post simple hysterectomy 1986 (no salpingo-oophorectomy)  (10) genetics testing 10/31/2017 through the Common Hereditary Cancers Panel offered by [facility] found no deleterious mutations in APC, ATM, AXIN2, BARD1, BMPR1A, BRCA1, BRCA2, BRIP1, CDH1, CDKN2A (p14ARF), CDKN2A (p16INK4a), CKD4, CHEK2, CTNNA1, DICER1, EPCAM (Deletion/duplication testing only), GREM1 (promoter region deletion/duplication testing only), KIT, MEN1, MLH1, MSH2, MSH3, MSH6, MUTYH, NBN, NF1, NHTL1, PALB2, PDGFRA, PMS2, POLD1, POLE, PTEN, RAD50, RAD51C, RAD51D, SDHB, SDHC, SDHD, SMAD4, SMARCA4. STK11, TP53, TSC1, TSC2, and VHL.  The following genes were evaluated for sequence changes only: SDHA and HOXB13 c.251G>A variant only.  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    PLAN:  [name] is now 14 years out from definitive surgery for her breast cancer with no evidence of disease recurrence.  This is very favorable. She completed more than 10 years of tamoxifen.  She tolerated it very well.  At this time there is no clinical concern for recurrence.  Her last mammogram without any evidence of recurrence. She wants to continue follow-up in the breast clinic every 6 months Since last visit, she had COVID, recovered well.  She has also noticed some  right breast pain hence her screening mammogram was changed to a diagnostic mammogram.  I do not find any evidence of recurrence.  I believe the pain that she has noted is likely secondary to fat necrosis.  She will proceed with diagnostic mammogram as scheduled.  She will otherwise return to clinic in 6 months or sooner as needed.  Her urine analysis today is negative, there is no evidence of infection.  I do not believe she will need any antibiotics.  Total time spent: 30 minutes,   *Total Encounter Time as defined by the Centers for Medicare and Medicaid  Services includes, in addition to the face-to-face time of a patient visit (documented in the note above) non-face-to-face time: obtaining and reviewing outside history, ordering and reviewing medications, tests or procedures, care coordination (communications with other health care professionals or caregivers) and documentation in the medical record.

## 2022-06-30 LAB — URINE CULTURE

## 2022-07-01 ENCOUNTER — Telehealth (HOSPITAL_BASED_OUTPATIENT_CLINIC_OR_DEPARTMENT_OTHER): Payer: Self-pay | Admitting: Cardiovascular Disease

## 2022-07-01 DIAGNOSIS — I10 Essential (primary) hypertension: Secondary | ICD-10-CM

## 2022-07-01 DIAGNOSIS — E876 Hypokalemia: Secondary | ICD-10-CM

## 2022-07-01 DIAGNOSIS — R002 Palpitations: Secondary | ICD-10-CM

## 2022-07-01 MED ORDER — POTASSIUM CHLORIDE ER 10 MEQ PO TBCR: 10.0000 meq | EXTENDED_RELEASE_TABLET | Freq: Every day | ORAL | 1 refills | Status: DC

## 2022-07-01 MED ORDER — METOPROLOL SUCCINATE ER 25 MG PO TB24: 25.0000 mg | ORAL_TABLET | Freq: Every day | ORAL | 2 refills | Status: DC

## 2022-07-01 NOTE — Telephone Encounter (Signed)
Returned call to patient. Patient has been taking her potassium daily, will increase to 81mq x3 days and then return to 163m daily. Patient agreeable to repeat lab work in one week. Lab slips mailed to patient. Refill provided to patient preferred pharmacy.       "K mildly low at 3.3. Potassium 10 mEq daily is on her med list. Can we inquire if she is taking?    If not, resume potassium 1021mdaily.  If taking, increase to 35m30maily x 3 days then return to 10 mEq daily.    BMP in 1 week for monitoring.    CaitLoel Dubonnet "

## 2022-07-01 NOTE — Telephone Encounter (Signed)
Pt's medication was sent to pt's pharmacy as requested. Confirmation received.  °

## 2022-07-01 NOTE — Telephone Encounter (Signed)
K mildly low at 3.3. Potassium 10 mEq daily is on her med list. Can we inquire if she is taking?   If not, resume potassium 40mq daily.  If taking, increase to 265m daily x 3 days then return to 10 mEq daily.   BMP in 1 week for monitoring.   CaLoel DubonnetNP

## 2022-07-01 NOTE — Telephone Encounter (Signed)
*  STAT* If patient is at the pharmacy, call can be transferred to refill team.   1. Which medications need to be refilled? (please list name of each medication and dose if known)   Metoprolol  2. Which pharmacy/location (including street and city if local pharmacy) is medication to be sent to? [facility]  3. Do they need a 30 day or 90 day supply? 90 days and refills

## 2022-07-01 NOTE — Telephone Encounter (Signed)
New Message:     Patient said she went to the [facility] today and they told her that her Potassium was low. She was told to check with Dr Oval Linsey to see if she wanted her to start back taking Potassium medicine. If she does, she will need it called in please.

## 2022-07-06 ENCOUNTER — Ambulatory Visit
Admission: RE | Admit: 2022-07-06 | Discharge: 2022-07-06 | Disposition: A | Discharge status: Home / Self Care | Payer: Medicare HMO | Source: Ambulatory Visit | Attending: Hematology and Oncology | Admitting: Hematology and Oncology

## 2022-07-06 DIAGNOSIS — C50411 Malignant neoplasm of upper-outer quadrant of right female breast: Secondary | ICD-10-CM

## 2022-07-06 DIAGNOSIS — N644 Mastodynia: Secondary | ICD-10-CM | POA: Diagnosis not present

## 2022-07-06 DIAGNOSIS — R92333 Mammographic heterogeneous density, bilateral breasts: Secondary | ICD-10-CM | POA: Diagnosis not present

## 2022-07-09 ENCOUNTER — Telehealth: Payer: Self-pay | Admitting: Cardiovascular Disease

## 2022-07-09 NOTE — Telephone Encounter (Signed)
Patient states she received a letter with lab orders.  She states it to have labs done in a week.  She wants to know if she should have them done now or a week before her appt. Please advise.

## 2022-07-09 NOTE — Telephone Encounter (Signed)
Returned call. Left VM per DPR to instruct  her to have labs done Monday or Tuesday of next week.  Loel Dubonnet, NP

## 2022-07-14 DIAGNOSIS — R350 Frequency of micturition: Secondary | ICD-10-CM | POA: Diagnosis not present

## 2022-07-14 DIAGNOSIS — M329 Systemic lupus erythematosus, unspecified: Secondary | ICD-10-CM | POA: Diagnosis not present

## 2022-07-14 DIAGNOSIS — E876 Hypokalemia: Secondary | ICD-10-CM | POA: Diagnosis not present

## 2022-07-15 LAB — BASIC METABOLIC PANEL
BUN/Creatinine Ratio: 29 — ABNORMAL HIGH (ref 12–28)
BUN: 19 mg/dL (ref 8–27)
CO2: 22 mmol/L (ref 20–29)
Calcium: 9.9 mg/dL (ref 8.7–10.3)
Chloride: 103 mmol/L (ref 96–106)
Creatinine, Ser: 0.66 mg/dL (ref 0.57–1.00)
Glucose: 100 mg/dL — ABNORMAL HIGH (ref 70–99)
Potassium: 4 mmol/L (ref 3.5–5.2)
Sodium: 141 mmol/L (ref 134–144)
eGFR: 93 mL/min/{1.73_m2} (ref >59)

## 2022-07-29 ENCOUNTER — Telehealth: Payer: Self-pay | Admitting: Internal Medicine

## 2022-07-29 NOTE — Telephone Encounter (Signed)
Patient requests to transfer primary care from Dr. Scarlette Calico, MD at [facility] to Dr. Berniece Pap, MD at [facility].   Pt states reason for request is recommendation by family member who is established with Dr. Randol Kern.    Approve or Decline?   Please route to [facility] Admin pool with a determination.

## 2022-08-05 ENCOUNTER — Other Ambulatory Visit: Payer: Medicare HMO

## 2022-08-16 ENCOUNTER — Ambulatory Visit (HOSPITAL_BASED_OUTPATIENT_CLINIC_OR_DEPARTMENT_OTHER): Payer: Medicare HMO | Admitting: Cardiovascular Disease

## 2022-08-16 ENCOUNTER — Encounter (HOSPITAL_BASED_OUTPATIENT_CLINIC_OR_DEPARTMENT_OTHER): Payer: Self-pay | Admitting: Cardiovascular Disease

## 2022-08-16 VITALS — BP 152/74 | HR 65 | Ht 67.0 in | Wt 185.2 lb

## 2022-08-16 DIAGNOSIS — E785 Hyperlipidemia, unspecified: Secondary | ICD-10-CM | POA: Diagnosis not present

## 2022-08-16 DIAGNOSIS — I10 Essential (primary) hypertension: Secondary | ICD-10-CM | POA: Diagnosis not present

## 2022-08-16 DIAGNOSIS — I119 Hypertensive heart disease without heart failure: Secondary | ICD-10-CM

## 2022-08-16 DIAGNOSIS — R002 Palpitations: Secondary | ICD-10-CM

## 2022-08-16 MED ORDER — BLOOD PRESSURE MONITOR KIT: PACK | 0 refills | Status: DC

## 2022-08-16 MED ORDER — METOPROLOL SUCCINATE ER 50 MG PO TB24: 50.0000 mg | ORAL_TABLET | Freq: Every day | ORAL | 3 refills | Status: DC

## 2022-08-16 NOTE — Assessment & Plan Note (Signed)
Keep working on diet and exercise.  She has done a good job of losing weight and keeping it off.

## 2022-08-16 NOTE — Assessment & Plan Note (Signed)
Blood pressure controlled as above.  She has no heart failure symptoms.

## 2022-08-16 NOTE — Addendum Note (Signed)
Addended by: Alvina Filbert B on: 08/16/2022 10:13 AM   Modules accepted: Orders

## 2022-08-16 NOTE — Patient Instructions (Addendum)
Medication Instructions:  CHANGE YOUR METOPROLOL TO 50 MG DAILY   *If you need a refill on your cardiac medications before your next appointment, please call your pharmacy*  Lab Work: NONE  Testing/Procedures: NONE  Follow-Up: At [facility], you and your health needs are our priority.  As part of our continuing mission to provide you with exceptional heart care, we have created designated Provider Care Teams.  These Care Teams include your primary Cardiologist (physician) and Advanced Practice Providers (APPs -  Physician Assistants and Nurse Practitioners) who all work together to provide you with the care you need, when you need it.  We recommend signing up for the patient portal called "MyChart".  Sign up information is provided on this After Visit Summary.  MyChart is used to connect with patients for Virtual Visits (Telemedicine).  Patients are able to view lab/test results, encounter notes, upcoming appointments, etc.  Non-urgent messages can be sent to your provider as well.   To learn more about what you can do with MyChart, go to [email or web address].    Your next appointment:   12 month(s)  The format for your next appointment:   In Person  Provider:   Skeet Latch, MD

## 2022-08-16 NOTE — Assessment & Plan Note (Signed)
Stable on metoprolol.  PACs and PVCs on Holter monitor.

## 2022-08-16 NOTE — Progress Notes (Signed)
Cardiology Office Note   Date:  08/16/2022   ID:  [name], [name] [birth year], MRN [id number]  PCP:  Janith Lima, MD  Cardiologist:   Skeet Latch, MD   No chief complaint on file.    History of Present Illness: [name] is a 71 y.o. female with SLE, hypertension, palpitations, and prior breast cancer who is being seen today for follow-up. She was initially seen 06/2018 for the evaluation of shortness of breath and palpitations at the request of Janith Lima, MD.  She notice they started after starting BuSpar. She wore a monitor 06/2018 that showed occasional PACs and PVCs. She followed-up with Kerin Ransom, PA 09/2020 due to elevated blood pressures but her blood pressure was controlled in the office. Dr. Ronnald Ramp ordered another monitor on 01/2021 that again showed PACs and PVCs. At the times when the patient triggered the monitor, sinus rhythm was detected.   Her blood pressures were in the 130s-170s. She presented to the ER and HCTZ was switched to losartan. She joined the [facility] and walks daily for 30 minutes. Her weight has decreased from 215 to 188 lbs. She is a retired [employer] scientist. At the last visit, her blood pressure was controlled. She was doing a good job with weight loss and wanted to wait to add more medications. She followed up with Terie Purser, NP and brought a blood pressure log from home at 110s-120s over 70-80s.  Today, she has been doing well. She occasionally experiences palpitations whenever she is laying on her right side but is not serious. She has not been taking her potassium supplement for the past 2 weeks and only takes it when she has cramping. She noted that she is going to see her rheumatologist for her rheumatic pain that has been radiating from her left shoulder down to her hand. Her right hand occasionally has some swelling. Her rheumatologist has her on prednisone, but she reports that it has not been working. She  has been consistent with weight management and physical activity, her goal is 166 lbs.  She has no exertional dyspnea or chest pain.   Past Medical History:  Diagnosis Date   Arthritis    R knee, hands    Back pain    Blood transfusion    1986- post-partial hysterectomy   Breast cancer ([place]) 2009   right lumpectomy and radiation   Cataract    Chicken pox    Colon cancer ([place]) 2003   per pt report, cancer found in a colon polyp in 2003, procedure done in [place].  did not require surgery, chemo etc.     Depression    Diverticulitis    Edema of both ankles    Family history of breast cancer 10/24/2017   Gallbladder problem    Gastritis    GERD (gastroesophageal reflux disease)    recently taken off anti reflux tx   Hepatitis    Hep. A- 1978   Hiatal hernia    History of stomach ulcers    Hypertension    Joint pain    Lactose intolerance    Lower extremity edema 01/11/2022   Lupus ([place])    Multiple gastric ulcers    Palpitations    Personal history of radiation therapy 2009   Rheumatic fever    Rheumatoid arthritis (HCC)    SLE (systemic lupus erythematosus related syndrome) (HCC)    Urinary incontinence    UTI (urinary tract infection)  Past Surgical History:  Procedure Laterality Date   ABDOMINAL HYSTERECTOMY     1986   APPENDECTOMY     1978 & tubal ligation    BIOPSY  07/25/2018   Procedure: BIOPSY;  Surgeon: Milus Banister, MD;  [facility];  Service: Endoscopy;;   BREAST EXCISIONAL BIOPSY Left 1998   BREAST LUMPECTOMY Bilateral    2009   BREAST SURGERY     R breast lumpectomy   CHOLECYSTECTOMY     1978- open    ESOPHAGOGASTRODUODENOSCOPY (EGD) WITH PROPOFOL N/A 07/25/2018   Procedure: ESOPHAGOGASTRODUODENOSCOPY (EGD) WITH PROPOFOL;  Surgeon: Milus Banister, MD;  Location: [facility] ENDOSCOPY;  Service: Endoscopy;  Laterality: N/A;   REPLACEMENT TOTAL KNEE Left    TONSILLECTOMY     1954   TOTAL KNEE ARTHROPLASTY  08/30/2011   Procedure: TOTAL KNEE  ARTHROPLASTY;  Surgeon: Kerin Salen;  Location: [facility];  Service: Orthopedics;  Laterality: Right;  Right Total Knee Arthroplasty   TUBAL LIGATION       Current Outpatient Medications  Medication Sig Dispense Refill   cholecalciferol (VITAMIN D) 1000 units tablet Take 1,000 Units by mouth daily.     hydrochlorothiazide (HYDRODIURIL) 25 MG tablet Take 1 tablet (25 mg total) by mouth daily. 90 tablet 3   ibuprofen (ADVIL) 800 MG tablet Take 800 mg by mouth every 8 (eight) hours as needed.     losartan (COZAAR) 25 MG tablet Take 1 tablet (25 mg total) by mouth daily. 90 tablet 3   metoprolol succinate (TOPROL-XL) 25 MG 24 hr tablet Take 1 tablet (25 mg total) by mouth daily. 90 tablet 2   Multiple Vitamins-Minerals (EMERGEN-C IMMUNE PLUS PO) Take 1 tablet by mouth daily.     omeprazole (PRILOSEC) 40 MG capsule Take 1 capsule (40 mg total) by mouth daily. 30 capsule 5   potassium chloride (KLOR-CON) 10 MEQ tablet Take 1 tablet (10 mEq total) by mouth daily. 90 tablet 1   predniSONE (STERAPRED UNI-PAK 48 TAB) 5 MG (48) TBPK tablet as directed Orally Once a day for 12 days     No current facility-administered medications for this visit.    Allergies:   Iodine, Iohexol, Latex, Penicillins, Tetracycline, Other, Penicillin g, and Sulfa antibiotics    Social History:  The patient  reports that she has never smoked. She has never used smokeless tobacco. She reports that she does not drink alcohol and does not use drugs.   Family History:  The patient's family history includes Arthritis in her maternal grandmother; Breast cancer (age of onset: 65) in her maternal aunt; Breast cancer (age of onset: 52) in her mother; Cancer in an other family member; Cancer (age of onset: 77) in her paternal aunt; Cancer (age of onset: 23) in her paternal grandmother; Depression in her mother; Heart attack in her father; Heart disease in her father; Hypertension in her brother, mother, sister, and another family member;  Kidney disease in her brother; Other in her father; Other (age of onset: 22) in her maternal grandmother; Stroke in her mother; Sudden death in her father.    ROS:  Please see the history of present illness.     Otherwise, review of systems are positive for none. All other systems are reviewed and negative.    PHYSICAL EXAM: VS:  BP (!) 152/74 (BP Location: Right Arm, Patient Position: Sitting, Cuff Size: Normal)   Pulse 65   Ht '5\' 7"'$  (1.702 m)   Wt 185 lb 3.2 oz (84 kg)  SpO2 97%   BMI 29.01 kg/m  , BMI Body mass index is 29.01 kg/m. GENERAL:  Well appearing HEENT:  Pupils equal round and reactive, fundi not visualized, oral mucosa unremarkable NECK:  No jugular venous distention, waveform within normal limits, carotid upstroke brisk and symmetric, no bruits, no thyromegaly LUNGS:  Clear to auscultation bilaterally HEART:  RRR.  PMI not displaced or sustained,S1 and S2 within normal limits, no S3, no S4, no clicks, no rubs, no murmurs ABD:  Flat, positive bowel sounds normal in frequency in pitch, no bruits, no rebound, no guarding, no midline pulsatile mass, no hepatomegaly, no splenomegaly EXT:  2 plus pulses throughout, no edema, no cyanosis no clubbing SKIN:  No rashes no nodules NEURO:  Cranial nerves II through XII grossly intact, motor grossly intact throughout [facility]:  Cognitively intact, oriented to person place and time  Monitor 02/17/21 Patient had a minimum heart rate of 52 bpm, maximum heart rate of 141 bpm, and average heart rate of 76 bpm. Predominant underlying rhythm was sinus rhythm. One run of non-sustained ventricular tachycardia occurred lasting 6 beats at longest with a max rate of 131 bpm at fastest. Isolated PACs were rare (<1.0%). Isolated PVCs were occasional (1.0%). No evidence of complete heart block or atrial fibrillation. Triggered and diary events associated with sinus rhythm.   No malignant arrhythmias.  Monitor 07/20/18 48 Hour Holter  Monitor   Quality: Fair.  Baseline artifact. Predominant rhythm: sinus rhythm Average heart rate: 68 bpm Max heart rate: 106 bpm Min heart rate: 46 bpm  PACs (0.12%) and PVCs (1.8%) were noted.   Echo 07/20/18 - Left ventricle: The cavity size was normal. There was severe    focal basal and mild concentric hypertrophy. Systolic function    was normal. The estimated ejection fraction was in the range of    55% to 60%. Wall motion was normal; there were no regional wall    motion abnormalities. There was an increased relative    contribution of atrial contraction to ventricular filling.    Doppler parameters are consistent with abnormal left ventricular    relaxation (grade 1 diastolic dysfunction).  - Aortic valve: Trileaflet; normal thickness, mildly calcified    leaflets. There was mild regurgitation.  - Mitral valve: There was trivial regurgitation. Valve area by    pressure half-time: 2.06 cm^2.  - Tricuspid valve: There was trivial regurgitation.   LE DVT Study 05/07/17 Summary:   - No evidence of deep vein thrombosis involving the visualized    veins of the left lower extremity.  - No evidence of Baker&'s cyst on the left.   EKG:  EKG is personally reviewed. 08/16/22: Sinus rhythm, LVH, LAFB. Rate 63 bpm.  01/11/22: Sinus rhythm, rate 60 bpm  Recent Labs: 06/29/2022: ALT 16; Hemoglobin 12.7; Platelet Count 199 07/14/2022: BUN 19; Creatinine, Ser 0.66; Potassium 4.0; Sodium 141    Lipid Panel    Component Value Date/Time   CHOL 153 06/29/2021 1319   TRIG 83 06/29/2021 1319   HDL 57 06/29/2021 1319   CHOLHDL 3 12/30/2020 1523   VLDL 21.8 12/30/2020 1523   LDLCALC 80 06/29/2021 1319      Wt Readings from Last 3 Encounters:  08/16/22 185 lb 3.2 oz (84 kg)  06/29/22 187 lb 1.6 oz (84.9 kg)  06/04/22 180 lb (81.6 kg)      ASSESSMENT AND PLAN: Essential hypertension Blood pressure very well-controlled.  Continue HCTZ and losartan.  She notes that she only  takes  the potassium intermittently when she has cramping.  Continue metoprolol.  She has been taking 25 mg twice a day.  We will switch this to 50 mg daily.  LVH (left ventricular hypertrophy) due to hypertensive disease, without heart failure Blood pressure controlled as above.  She has no heart failure symptoms.  Hyperlipidemia with target LDL less than 130 Keep working on diet and exercise.  She has done a good job of losing weight and keeping it off.  Palpitations Stable on metoprolol.  PACs and PVCs on Holter monitor.   Current medicines are reviewed at length with the patient today.  The patient does not have concerns regarding medicines.  The following changes have been made:  no change  Labs/ tests ordered today include:   No orders of the defined types were placed in this encounter.    Disposition:  FU with [name] C. Oval Linsey, MD, [facility] in 1 year   [employer] as a scribe for Skeet Latch, MD.,have documented all relevant documentation on the behalf of Skeet Latch, MD,as directed by  Skeet Latch, MD while in the presence of Skeet Latch, MD.  I, Tsaile Oval Linsey, MD have reviewed all documentation for this visit.  The documentation of the exam, diagnosis, procedures, and orders on 08/16/2022 are all accurate and complete.   Signed, Misako Roeder C. Oval Linsey, MD, [facility]  08/16/2022 9:45 AM    [place]

## 2022-08-16 NOTE — Assessment & Plan Note (Signed)
Blood pressure very well-controlled.  Continue HCTZ and losartan.  She notes that she only takes the potassium intermittently when she has cramping.  Continue metoprolol.  She has been taking 25 mg twice a day.  We will switch this to 50 mg daily.

## 2022-08-26 DIAGNOSIS — R197 Diarrhea, unspecified: Secondary | ICD-10-CM | POA: Diagnosis not present

## 2022-08-26 DIAGNOSIS — Z20822 Contact with and (suspected) exposure to covid-19: Secondary | ICD-10-CM | POA: Diagnosis not present

## 2022-08-26 DIAGNOSIS — R350 Frequency of micturition: Secondary | ICD-10-CM | POA: Diagnosis not present

## 2022-08-26 DIAGNOSIS — J3489 Other specified disorders of nose and nasal sinuses: Secondary | ICD-10-CM | POA: Diagnosis not present

## 2022-08-26 DIAGNOSIS — R059 Cough, unspecified: Secondary | ICD-10-CM | POA: Diagnosis not present

## 2022-09-08 ENCOUNTER — Ambulatory Visit (INDEPENDENT_AMBULATORY_CARE_PROVIDER_SITE_OTHER)
Admission: RE | Admit: 2022-09-08 | Discharge: 2022-09-08 | Disposition: A | Discharge status: Home / Self Care | Payer: Medicare HMO | Source: Ambulatory Visit | Attending: Internal Medicine | Admitting: Internal Medicine

## 2022-09-08 ENCOUNTER — Encounter: Payer: Self-pay | Admitting: Internal Medicine

## 2022-09-08 ENCOUNTER — Other Ambulatory Visit (INDEPENDENT_AMBULATORY_CARE_PROVIDER_SITE_OTHER): Payer: Medicare HMO

## 2022-09-08 ENCOUNTER — Ambulatory Visit (INDEPENDENT_AMBULATORY_CARE_PROVIDER_SITE_OTHER): Payer: Medicare HMO | Admitting: Internal Medicine

## 2022-09-08 VITALS — BP 139/64 | HR 55 | Temp 97.3°F | Resp 14 | Ht 67.0 in | Wt 186.4 lb

## 2022-09-08 DIAGNOSIS — M545 Low back pain, unspecified: Secondary | ICD-10-CM | POA: Diagnosis not present

## 2022-09-08 DIAGNOSIS — R2681 Unsteadiness on feet: Secondary | ICD-10-CM

## 2022-09-08 DIAGNOSIS — Z01419 Encounter for gynecological examination (general) (routine) without abnormal findings: Secondary | ICD-10-CM

## 2022-09-08 DIAGNOSIS — R7303 Prediabetes: Secondary | ICD-10-CM

## 2022-09-08 DIAGNOSIS — M2559 Pain in other specified joint: Secondary | ICD-10-CM | POA: Diagnosis not present

## 2022-09-08 DIAGNOSIS — M069 Rheumatoid arthritis, unspecified: Secondary | ICD-10-CM | POA: Insufficient documentation

## 2022-09-08 DIAGNOSIS — K2100 Gastro-esophageal reflux disease with esophagitis, without bleeding: Secondary | ICD-10-CM

## 2022-09-08 DIAGNOSIS — I10 Essential (primary) hypertension: Secondary | ICD-10-CM | POA: Diagnosis not present

## 2022-09-08 DIAGNOSIS — K219 Gastro-esophageal reflux disease without esophagitis: Secondary | ICD-10-CM | POA: Insufficient documentation

## 2022-09-08 DIAGNOSIS — M1991 Primary osteoarthritis, unspecified site: Secondary | ICD-10-CM | POA: Insufficient documentation

## 2022-09-08 DIAGNOSIS — M329 Systemic lupus erythematosus, unspecified: Secondary | ICD-10-CM

## 2022-09-08 DIAGNOSIS — M25551 Pain in right hip: Secondary | ICD-10-CM

## 2022-09-08 DIAGNOSIS — R109 Unspecified abdominal pain: Secondary | ICD-10-CM | POA: Diagnosis not present

## 2022-09-08 DIAGNOSIS — K5900 Constipation, unspecified: Secondary | ICD-10-CM | POA: Diagnosis not present

## 2022-09-08 DIAGNOSIS — E785 Hyperlipidemia, unspecified: Secondary | ICD-10-CM

## 2022-09-08 HISTORY — DX: Prediabetes: R73.03

## 2022-09-08 HISTORY — DX: Unsteadiness on feet: R26.81

## 2022-09-08 LAB — CBC
HCT: 39.1 % (ref 36.0–46.0)
Hemoglobin: 12.9 g/dL (ref 12.0–15.0)
MCHC: 32.9 g/dL (ref 30.0–36.0)
MCV: 93.9 fl (ref 78.0–100.0)
Platelets: 214 10*3/uL (ref 150.0–400.0)
RBC: 4.16 Mil/uL (ref 3.87–5.11)
RDW: 13.9 % (ref 11.5–15.5)
WBC: 4.4 10*3/uL (ref 4.0–10.5)

## 2022-09-08 LAB — URINALYSIS, ROUTINE W REFLEX MICROSCOPIC
Bilirubin Urine: NEGATIVE
Hgb urine dipstick: NEGATIVE
Ketones, ur: NEGATIVE
Leukocytes,Ua: NEGATIVE
Nitrite: NEGATIVE
Renal Epithel, UA: NONE SEEN
Specific Gravity, Urine: 1.015 (ref 1.000–1.030)
Total Protein, Urine: NEGATIVE
Urine Glucose: NEGATIVE
Urobilinogen, UA: 1 (ref 0.0–1.0)
pH: 6.5 (ref 5.0–8.0)

## 2022-09-08 LAB — COMPREHENSIVE METABOLIC PANEL
ALT: 20 U/L (ref 0–35)
AST: 26 U/L (ref 0–37)
Albumin: 4 g/dL (ref 3.5–5.2)
Alkaline Phosphatase: 63 U/L (ref 39–117)
BUN: 12 mg/dL (ref 6–23)
CO2: 28 mEq/L (ref 19–32)
Calcium: 10 mg/dL (ref 8.4–10.5)
Chloride: 104 mEq/L (ref 96–112)
Creatinine, Ser: 0.69 mg/dL (ref 0.40–1.20)
GFR: 86.01 mL/min (ref >60.00)
Glucose, Bld: 86 mg/dL (ref 70–99)
Potassium: 4 mEq/L (ref 3.5–5.1)
Sodium: 139 mEq/L (ref 135–145)
Total Bilirubin: 0.5 mg/dL (ref 0.2–1.2)
Total Protein: 7.2 g/dL (ref 6.0–8.3)

## 2022-09-08 LAB — HEMOGLOBIN A1C: Hgb A1c MFr Bld: 5.6 % (ref 4.6–6.5)

## 2022-09-08 MED ORDER — ROSUVASTATIN CALCIUM 40 MG PO TABS: 40.0000 mg | ORAL_TABLET | Freq: Every day | ORAL | 3 refills | Status: DC

## 2022-09-08 NOTE — Assessment & Plan Note (Signed)
Managed by Jeanie Cooks today

## 2022-09-08 NOTE — Assessment & Plan Note (Signed)
Her history of prediabetes and her blood pressure and age and how they have left her with a very high 10-year risk of heart attack or stroke and I encouraged her to take rosuvastatin but she request that we recheck the cholesterol and if it is better try to do with just diet alone so I gave her a cholesterol-lowering diet  After more discussion she decided she would go ahead and let me send in the rosuvastatin 40 that I recommended and we did discuss the possible situs effects of muscle cramps and that it is okay to stop if she gets them.

## 2022-09-08 NOTE — Patient Instructions (Signed)
It was a pleasure seeing you today!  Your health and satisfaction are my top priorities. If you believe your experience today was worthy of a 5-star rating, I'd be grateful for your feedback! Loralee Pacas, MD   CHECKOUT CHECKLIST  '[]'$    Schedule next appointment(s):    No follow-ups on file.  Any requested lab visits should be scheduled as appointments too  If you are not doing well:  Return to the office sooner Please bring all your medicine bottles to each appointment If your condition begins to worsen or become severe:  go to the emergency room or even call 911  REMINDERS:  '[]'$    Go get xrays after labs -  X-rays can be obtained at the  [facility] office. You can walk in M-F between 8:30am- noon or 1pm - 5pm. Tell them you are there for xrays ordered by me. They will send me the results, then I will let you know the results with instructions. Address: [address].  The Xray department is located in the basement.     '[]'$   (Optional):  Review your clinical notes on MyChart after they are completed.     Today's draft of the physician documented plan for today's visit: (final revisions will be visible on MyChart chart later) Flank pain Assessment & Plan: Will get UA If it doesn't finish resolving - will get kub to assess if stone is too big Recommended drinking lots of fluid for now since suspect kidney stone, but since improving, no need for strong pain medications and if UTI positive on urinalysis will call in antibiotic(s).  Recommended urine filtering.   Rheumatoid arthritis, involving unspecified site, unspecified whether rheumatoid factor present ([place])  Lupus ([place])  Essential hypertension Assessment & Plan: Managed by Jeanie Cooks today   Well woman exam  Pain in other joint Assessment & Plan: Stomach hip and back bother Offered XR or to review old xrays- she hasn't had so we will get XR back and stomach   Pain of right hip  joint Assessment & Plan: Stomach hip and back bother Offered XR or to review old xrays- she hasn't had so we will get XR back and stomach   Gastroesophageal reflux disease with esophagitis without hemorrhage  Prediabetes  Hyperlipidemia with target LDL less than 130 Assessment & Plan: Her history of prediabetes and her blood pressure and age and how they have left her with a very high 10-year risk of heart attack or stroke and I encouraged her to take rosuvastatin but she request that we recheck the cholesterol and if it is better try to do with just diet alone so I gave her a cholesterol-lowering diet  After more discussion she decided she would go ahead and let me send in the rosuvastatin 40 that I recommended and we did discuss the possible situs effects of muscle cramps and that it is okay to stop if she gets them.        QUESTIONS & CONCERNS: CLINICAL: please contact us via phone [phone] OR MyChart messaging  LAB & IMAGING:   We will call you if the results are significantly abnormal or you don't use MyChart.  Most normal results will be posted to MyChart immediately and have a clinical review message by Dr. Randol Kern posted within 2-3 business days.   If you have not heard from [name] regarding the results in 2 weeks OR if you need priority reporting, please contact this office. MYCHART:  The fastest way to get your results and easiest way to stay in touch with [name] is by activating your My Chart account. Instructions are located on the last page of this paperwork.  BILLING: xray and lab orders are billed from separate companies and questions./concerns should be directed to the [facility].  For visit charges please discuss with our administrative services COMPLAINTS:  please let Dr. Randol Kern know or see the [place], by asking at the front desk: we want you to be satisfied with every experience and we would be grateful for the  opportunity to address any problems

## 2022-09-08 NOTE — Addendum Note (Signed)
Addended by: Loralee Pacas on: 09/08/2022 12:32 PM   Modules accepted: Orders

## 2022-09-08 NOTE — Progress Notes (Signed)
[place]  Phone: [phone]  New patient visit  Visit Date: 09/08/2022 Patient: [name]   DOB: 1951/02/23   71 y.o. Female  MRN: [id number]  Today's healthcare provider: Loralee Pacas, MD  Assessment and Plan:   Presents for transfer of care due to her cousin recommended me and good online reviews   [name] was seen today for transfer of care and abdominal pain.  Flank pain Overview: Urgent care told UTI responsible but she reports she didn't have one. History of kidney stones and kind of feels like that Started 3 weeks ago     Assessment & Plan: Will get UA If it doesn't finish resolving - will get kub to assess if stone is too big Recommended drinking lots of fluid for now since suspect kidney stone, but since improving, no need for strong pain medications and if UTI positive on urinalysis will call in antibiotic(s).  Recommended urine filtering.  Orders: -     Urinalysis, Routine w reflex microscopic -     CBC With Differential/Platelet; Future -     Comprehensive metabolic panel  Rheumatoid arthritis, involving unspecified site, unspecified whether rheumatoid factor present ([place])  Lupus ([place]) Overview:     Essential hypertension Overview: Mild LVH on echo Oct 2019   Assessment & Plan: Managed by Jeanie Cooks today   Well woman exam -     Ambulatory referral to Gynecology  Pain in other joint Overview: Hips and back chronic Had bilateral total knee arthroplasty / replacement no pain there anymore.    Assessment & Plan: Stomach hip and back bother Offered XR or to review old xrays- she hasn't had so we will get XR back and stomach  Orders: -     DG Lumbar Spine Complete; Future -     DG Abd 1 View; Future  Pain of right hip joint Overview: Hips and back chronic Had bilateral total knee arthroplasty / replacement no pain there anymore.    Assessment & Plan: Stomach hip and back  bother Offered XR or to review old xrays- she hasn't had so we will get XR back and stomach  Orders: -     DG HIP UNILAT W OR W/O PELVIS 2-3 VIEWS RIGHT; Future  Gastroesophageal reflux disease with esophagitis without hemorrhage Overview: Associated with hiatal hernia Takes occasional as needed omeprazole burst. Encouraged try pepcid complete.   Prediabetes Overview: Lab Results  Component Value Date   HGBA1C 5.7 (H) 06/29/2021     Orders: -     Hemoglobin A1c  Hyperlipidemia with target LDL less than 130 Overview: The 10-year ASCVD risk score (Arnett DK, et al., 2019) is: 27.7%   Values used to calculate the score:     Age: 75 years     Sex: Female     Is Non-Hispanic African American: Yes     Diabetic: Yes     Tobacco smoker: No     Systolic Blood Pressure: 683 mmHg     Is BP treated: Yes     HDL Cholesterol: 57 mg/dL     Total Cholesterol: 153 mg/dL   Lab Results  Component Value Date   HGBA1C 5.7 (H) 06/29/2021     Assessment & Plan: Her history of prediabetes and her blood pressure and age and how they have left her with a very high 10-year risk of heart attack or stroke and I encouraged her to take rosuvastatin but she request that we recheck the cholesterol  and if it is better try to do with just diet alone so I gave her a cholesterol-lowering diet  After more discussion she decided she would go ahead and let me send in the rosuvastatin 40 that I recommended and we did discuss the possible situs effects of muscle cramps and that it is okay to stop if she gets them.   Orders: -     Lipid panel; Future -     Rosuvastatin Calcium; Take 1 tablet (40 mg total) by mouth daily.  Dispense: 90 tablet; Refill: 3  Difficulty standing Overview: Due to hip pain mainly- she agreed to physical therapy if insurance will cover  Orders: -     Ambulatory referral to Physical Therapy        Today's visit is a problem focused visit, but preventive care maintenance  concerns were considered by the physician in deciding follow up : Health Maintenance  Topic Date Due   Hepatitis C Screening  Never done   Zoster Vaccines- Shingrix (1 of 2) Never done   INFLUENZA VACCINE  04/20/2022   Medicare Annual Wellness (AWV)  11/24/2022   MAMMOGRAM  07/06/2024   COLONOSCOPY (Pts 45-41yr Insurance coverage will need to be confirmed)  11/10/2026   DTaP/Tdap/Td (3 - Td or Tdap) 12/31/2030   Pneumonia Vaccine 71 Years old  Completed   DEXA SCAN  Completed   HPV VACCINES  Aged Out   COVID-19 Vaccine  Discontinued    Subjective:  Patient presents today to establish care.  Chief Complaint  Patient presents with   Transfer of care   Abdominal Pain    Currently, constant-external upon exertion (exercising for example).    Problem-oriented charting was used to develop and update her medical history: Problem  Rheumatoid Arthritis ([facility])  Gerd (Gastroesophageal Reflux Disease)   Associated with hiatal hernia Takes occasional as needed omeprazole burst. Encouraged try pepcid complete.   Prediabetes   Lab Results  Component Value Date   HGBA1C 5.7 (H) 06/29/2021      Difficulty Standing   Due to hip pain mainly- she agreed to physical therapy if insurance will cover   Hyperlipidemia With Target Ldl Less Than 130   The 10-year ASCVD risk score (Arnett DK, et al., 2019) is: 27.7%   Values used to calculate the score:     Age: [age]years     Sex: Female     Is Non-Hispanic African American: Yes     Diabetic: Yes     Tobacco smoker: No     Systolic Blood Pressure: 1630mmHg     Is BP treated: Yes     HDL Cholesterol: 57 mg/dL     Total Cholesterol: 153 mg/dL   Lab Results  Component Value Date   HGBA1C 5.7 (H) 06/29/2021     Pain in Joint   Hips and back chronic Had bilateral total knee arthroplasty / replacement no pain there anymore.     DIVERTICULOSIS, COLON       Essential Hypertension   Mild LVH on echo Oct 2019    Lupus ([facility])        Flank Pain   Urgent care told UTI responsible but she reports she didn't have one. History of kidney stones and kind of feels like that Started 3 weeks ago      Encounter for General Adult Medical Examination With Abnormal Findings (Resolved)   The 10-year ASCVD risk score (Mikey BussingDC Jr., et al., 2013) is: 13.5%   Values  used to calculate the score:     Age: 16 years     Sex: Female     Is Non-Hispanic African American: Yes     Diabetic: No     Tobacco smoker: No     Systolic Blood Pressure: 756 mmHg     Is BP treated: Yes     HDL Cholesterol: 61.3 mg/dL     Total Cholesterol: 159 mg/dL   Gastritis and Gastroduodenitis (Resolved)   Endoscopy 07/25/18   Genetic Testing (Resolved)   Ms. Donati had genetic testing due to a personal history of breast cancer and colon cancer as well as a family history of breast cancer.  The Common Hereditary Cancers Panel was ordered. The Common Hereditary Cancer Panel offered by [facility] includes sequencing and/or deletion duplication testing of the following 47 genes: APC, ATM, AXIN2, BARD1, BMPR1A, BRCA1, BRCA2, BRIP1, CDH1, CDKN2A (p14ARF), CDKN2A (p16INK4a), CKD4, CHEK2, CTNNA1, DICER1, EPCAM (Deletion/duplication testing only), GREM1 (promoter region deletion/duplication testing only), KIT, MEN1, MLH1, MSH2, MSH3, MSH6, MUTYH, NBN, NF1, NHTL1, PALB2, PDGFRA, PMS2, POLD1, POLE, PTEN, RAD50, RAD51C, RAD51D, SDHB, SDHC, SDHD, SMAD4, SMARCA4. STK11, TP53, TSC1, TSC2, and VHL.  The following genes were evaluated for sequence changes only: SDHA and HOXB13 c.251G>A variant only.  Results: No pathogenic variants identified.  2 Variants of uncertain significance (VUS's) were identified in the genes SDHB c.65G>C (p.Cys22Ser) and VHL c.3G>A (Initiator codon).  The date of this test report is 10/31/2017. UPDATE: The SDHB c.65G>C (p.Cys22Ser) VUS has been reclassified to Likely Benign as a result of re-review of the evidence in light of new variant interpretation  guidelines and/or new information.  The amended report date is 02/21/2019.   Osteoarthritis of Right Knee (Resolved)  MAMMOGRAM, ABNORMAL, RIGHT (Resolved)   Qualifier: Diagnosis of  By: Hassell Done FNP, [place]        Depression Screen    09/08/2022   10:09 AM 06/18/2021    8:37 AM 12/30/2020    2:14 PM  PHQ 2/9 Scores  PHQ - 2 Score 0 1 0  PHQ- 9 Score  8    No results found for any visits on 09/08/22.  The following were reviewed and entered/updated into her MEDICAL RECORD [place] History:  Diagnosis Date   Arthritis    R knee, hands    Back pain    Blood transfusion    1986- post-partial hysterectomy   Breast cancer ([place]) 2009   right lumpectomy and radiation   Cataract    Chicken pox    Colon cancer ([place]) 2003   per pt report, cancer found in a colon polyp in 2003, procedure done in [place].  did not require surgery, chemo etc.     Depression    Difficulty standing 09/08/2022   Due to hip pain mainly- she agreed to physical therapy if insurance will cover   Diverticulitis    Edema of both ankles    Family history of breast cancer 10/24/2017   Gallbladder problem    Gastritis    GERD (gastroesophageal reflux disease)    recently taken off anti reflux tx   Hepatitis    Hep. A- 1978   Hiatal hernia    History of stomach ulcers    Hypertension    Joint pain    Lactose intolerance    Lower extremity edema 01/11/2022   Lupus ([place])    Multiple gastric ulcers    Palpitations    Personal history of radiation therapy 2009   Prediabetes 09/08/2022  Lab Results Component Value Date  HGBA1C 5.7 (H) 06/29/2021     Rheumatic fever    Rheumatoid arthritis ([place])    SLE (systemic lupus erythematosus related syndrome) ([place])    Urinary incontinence    UTI (urinary tract infection)    Past Surgical History:  Procedure Laterality Date   ABDOMINAL HYSTERECTOMY     1986   APPENDECTOMY     1978 & tubal ligation    BIOPSY  07/25/2018   Procedure: BIOPSY;  Surgeon:  Milus Banister, MD;  [facility] ENDOSCOPY;  Service: Endoscopy;;   BREAST EXCISIONAL BIOPSY Left 1998   BREAST LUMPECTOMY Bilateral    2009   BREAST SURGERY     R breast lumpectomy   CHOLECYSTECTOMY     1978- open    ESOPHAGOGASTRODUODENOSCOPY (EGD) WITH PROPOFOL N/A 07/25/2018   Procedure: ESOPHAGOGASTRODUODENOSCOPY (EGD) WITH PROPOFOL;  Surgeon: Milus Banister, MD;  Location[facility] ENDOSCOPY;  Service: Endoscopy;  Laterality: N/A;   REPLACEMENT TOTAL KNEE Left    TONSILLECTOMY     1954   TOTAL KNEE ARTHROPLASTY  08/30/2011   Procedure: TOTAL KNEE ARTHROPLASTY;  Surgeon: Kerin Salen;  Location: [place];  Service: Orthopedics;  Laterality: Right;  Right Total Knee Arthroplasty   TUBAL LIGATION     Family History  Problem Relation Age of Onset   Breast cancer Mother 25   Hypertension Mother    Stroke Mother    Depression Mother    Heart attack Father    Other Father    Heart disease Father    Sudden death Father    Hypertension Sister    Hypertension Brother    Kidney disease Brother    Other Maternal Grandmother 57       brain tumor dx at 33- did not bx or do additional testing on tumor   Arthritis Maternal Grandmother    Cancer Paternal Grandmother 68       type unk   Breast cancer Maternal Aunt 23   Cancer Paternal Aunt 65       type unk   Cancer Other    Hypertension Other    Anesthesia problems Neg Hx    Hypotension Neg Hx    Malignant hyperthermia Neg Hx    Pseudochol deficiency Neg Hx    Colon cancer Neg Hx    Esophageal cancer Neg Hx    Stomach cancer Neg Hx    Pancreatic cancer Neg Hx    Liver disease Neg Hx    Outpatient Medications Prior to Visit  Medication Sig Dispense Refill   FLUAD QUADRIVALENT 0.5 ML injection      hydrochlorothiazide (HYDRODIURIL) 25 MG tablet Take 1 tablet (25 mg total) by mouth daily. 90 tablet 3   ibuprofen (ADVIL) 800 MG tablet Take 800 mg by mouth every 8 (eight) hours as needed.     losartan (COZAAR) 25 MG tablet Take  1 tablet (25 mg total) by mouth daily. 90 tablet 3   metoprolol succinate (TOPROL-XL) 50 MG 24 hr tablet Take 1 tablet (50 mg total) by mouth daily. 90 tablet 3   Multiple Vitamins-Minerals (EMERGEN-C IMMUNE PLUS PO) Take 1 tablet by mouth daily.     omeprazole (PRILOSEC) 40 MG capsule Take 1 capsule (40 mg total) by mouth daily. 30 capsule 5   potassium chloride (KLOR-CON) 10 MEQ tablet Take 1 tablet (10 mEq total) by mouth daily. 90 tablet 1   cholecalciferol (VITAMIN D) 1000 units tablet Take 1,000 Units by mouth  daily.     predniSONE (STERAPRED UNI-PAK 48 TAB) 5 MG (48) TBPK tablet as directed Orally Once a day for 12 days     No facility-administered medications prior to visit.    Allergies  Allergen Reactions   Iodine Anaphylaxis   Iohexol Anaphylaxis     Code: HIVES, Desc: PT IS ALLERGIC TO IODINATED CONTRAST; REACTION INCLUDES FACIAL SWELLING,HIVES,RESPIRATORY DISTRESS;PT HASN'T HAD SCAN W/PREMEDS.;REFUSED CONTRAST OF ANY KIND!  KR    Latex Anaphylaxis   Penicillins Anaphylaxis and Rash    Has patient had a PCN reaction causing immediate rash, facial/tongue/throat swelling, SOB or lightheadedness with hypotension: Yes Has patient had a PCN reaction causing severe rash involving mucus membranes or skin necrosis: Yes Has patient had a PCN reaction that required hospitalization: No Has patient had a PCN reaction occurring within the last 10 years: No If all of the above answers are "NO", then may proceed with Cephalosporin use.    Celecoxib Other (See Comments)    Severe stomach pain   Lactose Other (See Comments)    Lactose intolerant-cramping   Other Other (See Comments)   Penicillin G Other (See Comments)   Sulfa Antibiotics Hives   Social History   Tobacco Use   Smoking status: Never   Smokeless tobacco: Never  Vaping Use   Vaping Use: Never used  Substance Use Topics   Alcohol use: No   Drug use: No    Immunization History  Administered Date(s) Administered    Influenza Whole 06/27/2009   Influenza, High Dose Seasonal PF 06/03/2019   Influenza-Unspecified 07/02/2015, 08/16/2020   PFIZER(Purple Top)SARS-COV-2 Vaccination 11/22/2019, 12/18/2019, 08/16/2020   Pneumococcal Conjugate-13 06/03/2019   Pneumococcal Polysaccharide-23 12/30/2020   Td 11/24/2007   Tdap 12/30/2020    Objective:  BP 139/64 (BP Location: Left Arm, Patient Position: Sitting)   Pulse (!) 55   Temp (!) 97.3 F (36.3 C) (Temporal)   Resp 14   Ht _0  (1.702 m)   Wt 186 lb 6.4 oz (84.6 kg)   SpO2 100%   BMI 29.19 kg/m  Body mass index is 29.19 kg/m.  Physical Exam  Vital signs reviewed.  Nursing notes reviewed. General Appearance/Constitutional:  slightly Overweight female in no acute distress Musculoskeletal: All extremities are intact.  Neurological:  Awake, alert,  No obvious focal neurological deficits or cognitive impairments Psychiatric:  Appropriate mood, pleasant demeanor Problem-specific findings:  can barely stand up due to hip pain, uses arms, very sweet and polite lady    Results Reviewed: Results for orders placed or performed in visit on 16/94/50  Basic metabolic panel  Result Value Ref Range   Glucose 100 (H) 70 - 99 mg/dL   BUN 19 8 - 27 mg/dL   Creatinine, Ser 0.66 0.57 - 1.00 mg/dL   eGFR 93 >59 mL/min/1.73   BUN/Creatinine Ratio 29 (H) 12 - 28   Sodium 141 134 - 144 mmol/L   Potassium 4.0 3.5 - 5.2 mmol/L   Chloride 103 96 - 106 mmol/L   CO2 22 20 - 29 mmol/L   Calcium 9.9 8.7 - 10.3 mg/dL

## 2022-09-08 NOTE — Assessment & Plan Note (Signed)
Will get UA If it doesn't finish resolving - will get kub to assess if stone is too big Recommended drinking lots of fluid for now since suspect kidney stone, but since improving, no need for strong pain medications and if UTI positive on urinalysis will call in antibiotic(s).  Recommended urine filtering.

## 2022-09-08 NOTE — Assessment & Plan Note (Signed)
Stomach hip and back bother Offered XR or to review old xrays- she hasn't had so we will get XR back and stomach

## 2022-09-08 NOTE — Assessment & Plan Note (Signed)
Encouraged her to start using her fiber gummies

## 2022-09-14 NOTE — Progress Notes (Signed)
No kidney stone visible- but xrays can miss

## 2022-09-14 NOTE — Progress Notes (Signed)
See if flank pain better Xray shows worn out disks might explain the pain, rather than kidney stone If she thinks that's what's doing it, I recommend physical therapy

## 2022-09-14 NOTE — Progress Notes (Signed)
Mild hip arthritis. Referrals to physical therapy or sports medicine would be ok to place if she wants

## 2022-09-15 DIAGNOSIS — H5213 Myopia, bilateral: Secondary | ICD-10-CM | POA: Diagnosis not present

## 2022-09-15 DIAGNOSIS — H52209 Unspecified astigmatism, unspecified eye: Secondary | ICD-10-CM | POA: Diagnosis not present

## 2022-09-16 ENCOUNTER — Other Ambulatory Visit: Payer: Self-pay

## 2022-10-06 DIAGNOSIS — M25511 Pain in right shoulder: Secondary | ICD-10-CM | POA: Diagnosis not present

## 2022-10-06 DIAGNOSIS — M79641 Pain in right hand: Secondary | ICD-10-CM | POA: Diagnosis not present

## 2022-10-06 DIAGNOSIS — M329 Systemic lupus erythematosus, unspecified: Secondary | ICD-10-CM | POA: Diagnosis not present

## 2022-10-06 DIAGNOSIS — Z6828 Body mass index (BMI) 28.0-28.9, adult: Secondary | ICD-10-CM | POA: Diagnosis not present

## 2022-10-06 DIAGNOSIS — M79642 Pain in left hand: Secondary | ICD-10-CM | POA: Diagnosis not present

## 2022-10-06 DIAGNOSIS — E663 Overweight: Secondary | ICD-10-CM | POA: Diagnosis not present

## 2022-10-06 DIAGNOSIS — M1991 Primary osteoarthritis, unspecified site: Secondary | ICD-10-CM | POA: Diagnosis not present

## 2022-10-13 ENCOUNTER — Telehealth: Payer: Self-pay | Admitting: Internal Medicine

## 2022-10-13 NOTE — Telephone Encounter (Signed)
Patient states: - Her insurance changed from [place] to [place] so her pharmacy has changed as well  - New pharmacy is [facility] order-- they can be reached at [phone] and fax is [phone]

## 2022-10-13 NOTE — Telephone Encounter (Signed)
Updated pharmacy in patient's file.

## 2022-10-21 ENCOUNTER — Ambulatory Visit: Payer: Medicare HMO | Admitting: Physical Therapy

## 2022-10-21 ENCOUNTER — Encounter: Payer: Self-pay | Admitting: Physical Therapy

## 2022-10-21 DIAGNOSIS — M25551 Pain in right hip: Secondary | ICD-10-CM

## 2022-10-21 DIAGNOSIS — M25662 Stiffness of left knee, not elsewhere classified: Secondary | ICD-10-CM

## 2022-10-21 DIAGNOSIS — M25661 Stiffness of right knee, not elsewhere classified: Secondary | ICD-10-CM | POA: Diagnosis not present

## 2022-10-21 DIAGNOSIS — M5459 Other low back pain: Secondary | ICD-10-CM

## 2022-10-21 NOTE — Therapy (Signed)
OUTPATIENT PHYSICAL THERAPY LOWER EXTREMITY EVALUATION   Patient Name: [name] MRN: [id number] DOB:07-07-1951, 72 y.o., female Today's Date: 10/21/2022  END OF SESSION:  PT End of Session - 10/21/22 1028     Visit Number 1    Number of Visits 16    Date for PT Re-Evaluation 12/16/22    Authorization Type [employer]    PT Start Time 0935    PT Stop Time 1017    PT Time Calculation (min) 42 min    Activity Tolerance Patient tolerated treatment well    Behavior During Therapy [facility] for tasks assessed/performed             Past Medical History:  Diagnosis Date   Arthritis    R knee, hands    Back pain    Blood transfusion    1986- post-partial hysterectomy   Breast cancer ([place]) 2009   right lumpectomy and radiation   Cataract    Chicken pox    Colon cancer ([place]) 2003   per pt report, cancer found in a colon polyp in 2003, procedure done in [place].  did not require surgery, chemo etc.     Depression    Difficulty standing 09/08/2022   Due to hip pain mainly- she agreed to physical therapy if insurance will cover   Diverticulitis    Edema of both ankles    Family history of breast cancer 10/24/2017   Gallbladder problem    Gastritis    GERD (gastroesophageal reflux disease)    recently taken off anti reflux tx   Hepatitis    Hep. A- 1978   Hiatal hernia    History of stomach ulcers    Hypertension    Joint pain    Lactose intolerance    Lower extremity edema 01/11/2022   Lupus ([place])    Multiple gastric ulcers    Palpitations    Personal history of radiation therapy 2009   Prediabetes 09/08/2022   Lab Results Component Value Date  HGBA1C 5.7 (H) 06/29/2021     Rheumatic fever    Rheumatoid arthritis ([place])    SLE (systemic lupus erythematosus related syndrome) ([place])    Urinary incontinence    UTI (urinary tract infection)    Past Surgical History:  Procedure Laterality Date   [place] & tubal  ligation    BIOPSY  07/25/2018   Procedure: BIOPSY;  Surgeon: Milus Banister, MD;  Location: [facility] ENDOSCOPY;  Service: Endoscopy;;   BREAST EXCISIONAL BIOPSY Left 1998   BREAST LUMPECTOMY Bilateral    2009   BREAST SURGERY     R breast lumpectomy   CHOLECYSTECTOMY     1978- open    ESOPHAGOGASTRODUODENOSCOPY (EGD) WITH PROPOFOL N/A 07/25/2018   Procedure: ESOPHAGOGASTRODUODENOSCOPY (EGD) WITH PROPOFOL;  Surgeon: Milus Banister, MD;  Location: [place];  Service: Endoscopy;  Laterality: N/A;   REPLACEMENT TOTAL KNEE Left    TONSILLECTOMY     1954   TOTAL KNEE ARTHROPLASTY  08/30/2011   Procedure: TOTAL KNEE ARTHROPLASTY;  Surgeon: Kerin Salen;  Location: [place];  Service: Orthopedics;  Laterality: Right;  Right Total Knee Arthroplasty   TUBAL LIGATION     Patient Active Problem List   Diagnosis Date Noted   Primary localized osteoarthrosis of multiple sites 09/08/2022   Rheumatoid arthritis ([place]) 09/08/2022   GERD (gastroesophageal reflux disease) 09/08/2022   Prediabetes 09/08/2022   Difficulty standing  09/08/2022   Lower extremity edema 01/11/2022   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   Class 1 obesity due to excess calories with serious comorbidity and body mass index (BMI) of 32.0 to 32.9 in adult 02/17/2021   Psychophysiological insomnia 02/17/2021   Hyperlipidemia with target LDL less than 130 12/30/2020   LVH (left ventricular hypertrophy) due to hypertensive disease, without heart failure 12/30/2020   History of breast cancer 08/07/2018   Family history of coronary artery disease in father 08/07/2018   Family history of breast cancer 10/24/2017   Personal history of colon cancer 10/24/2017   Pain in joint 04/22/2009   Malignant neoplasm of upper-outer quadrant of right breast in female, estrogen receptor positive ([place]) 01/11/2008   Hiatal hernia 12/14/2007   DIVERTICULOSIS, COLON 12/14/2007   Essential hypertension 11/03/2007   Lupus ([place]) 11/03/2007    Flank pain 11/03/2007    PCP: Berniece Pap  REFERRING PROVIDER: Berniece Pap  REFERRING DIAG: Difficulty in walking   THERAPY DIAG:  Pain in right hip  Other low back pain  Stiffness of left knee, not elsewhere classified  Stiffness of right knee, not elsewhere classified  Rationale for Evaluation and Treatment: Rehabilitation  ONSET DATE: 1 year ago.   SUBJECTIVE:   SUBJECTIVE STATEMENT: Pt reports R hip pain about 1 year, can't sleep on R side. Can sleep on L comfortably. Also pain with Standing for long time, walking. Better with movement.  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  She does not use No AD. Does not live alone.  OMA:YOKHT: hands more, is on new meds- not sure if working yet.  Rash at times, fevers, 3x/yr flares.  She also reports increased pain below sternum in abdomen, when bending over cleaning. She has hiatal hernia but has not previously had any symptoms from it.    PERTINENT HISTORY: Lupus, RA, bil TKA,  Previous breast CA,colon  PAIN:  Are you having pain? Yes: NPRS scale: up to 7 /10 Pain location: R hip, R low back Pain description: same Aggravating factors: Sleeping on R side, Standing  Relieving factors: rest  PRECAUTIONS: None  WEIGHT BEARING RESTRICTIONS: No  FALLS:  Has patient fallen in last 6 months? Yes. Number of falls 1 - yesterday, tripped up step, fell into bush, did not hurt herself.    PLOF: Independent  PATIENT GOALS: Less pain in hip/back.   NEXT MD VISIT:   OBJECTIVE:   DIAGNOSTIC FINDINGS: Recent x-ray for Back and hip.   PATIENT SURVEYS: Foto: will do next session  FOTO:  COGNITION: Overall cognitive status: Within functional limits for tasks assessed     SENSATION: WFL  POSTURE:    PALPATION:  hypomobile bil knee, L>R  knee (flexion) ;  Very tender R gr trochanter, mild tenderness into R glute, soreness in R SI, minimal pain in back.    LOWER EXTREMITY  ROM:  Active ROM Right eval Left eval  Hip flexion wfl wfl  Hip extension Mod limitation Mod limitation  Hip abduction Mild limitation Mild limitation  Hip adduction    Hip internal rotation    Hip external rotation Mild limitation   Knee flexion 105 85  Knee extension 0 -5  Ankle dorsiflexion    Ankle plantarflexion    Ankle inversion    Ankle eversion     (Blank rows = not tested)  LOWER EXTREMITY MMT:  MMT Right eval Left eval  Hip flexion 4- 4-  Hip extension  Hip abduction 4- (pain) 4-  Hip adduction    Hip internal rotation    Hip external rotation    Knee flexion 4 4  Knee extension 4 4  Ankle dorsiflexion    Ankle plantarflexion    Ankle inversion    Ankle eversion     (Blank rows = not tested)  LOWER EXTREMITY SPECIAL TESTS:  Neg faber, fadir,   + tenderness at gr troch  FUNCTIONAL TESTS:  Sit to stand: in efficient mechanics, not using/bending knees , likely due to stiffness, increased hinging from back/hips.   GAIT: Distance walked: 100 Assistive device utilized: None Level of assistance: Complete Independence Comments: unremarkable   TODAY'S TREATMENT:                                                                                                                              DATE:   10/21/22: Therapeutic Exercise: Aerobic: Supine: Seated: sit to stand 3 x 5 with education on optimal mechanics.  Standing: Stretches:  Supine SKTC 30 sec x 3 bil; R piriformis (modified ) x 2 min;  hip ER fallouts x 10;  Neuromuscular Re-education: Manual Therapy: Therapeutic Activity: Self Care:   PATIENT EDUCATION:  Education details: PT POC, exam findings, HEP Person educated: Patient Education method: Explanation, Demonstration, Tactile cues, Verbal cues, and Handouts Education comprehension: verbalized understanding, returned demonstration, verbal cues required, tactile cues required, and needs further education  HOME EXERCISE PROGRAM: Access  Code: [email or web address] URL: [email or web address] Date: 10/21/2022 Prepared by: Lyndee Hensen  Exercises - Hooklying Single Knee to Chest Stretch  - 2 x daily - 3 reps - 30 hold - Bent Knee Fallouts  - 2 x daily - 1 sets - 10 reps - 5 hold - Sit to Stand  - 1 x daily - 1 sets - 10 reps  ASSESSMENT:  CLINICAL IMPRESSION: Patient   OBJECTIVE IMPAIRMENTS: Abnormal gait, decreased activity tolerance, decreased mobility, difficulty walking, decreased ROM, decreased strength, hypomobility, impaired flexibility, improper body mechanics, and pain.   ACTIVITY LIMITATIONS: bending, sitting, standing, squatting, sleeping, stairs, transfers, and locomotion level  PARTICIPATION LIMITATIONS: meal prep, cleaning, laundry, community activity, and yard work  PERSONAL FACTORS: Past/current experiences Knee stiffness from previous TKA, are also affecting patient's functional outcome.   REHAB POTENTIAL: Good  CLINICAL DECISION MAKING: Stable/uncomplicated  EVALUATION COMPLEXITY: Low   GOALS: Goals reviewed with patient? Yes  SHORT TERM GOALS: Target date: 11/04/2022  Pt to be independent with initial HEP  Goal status: INITIAL  2. Pt to demo ability for optimal mechanics for sit to stand from higher mat table.   Goal status: INITIAL    LONG TERM GOALS: Target date: 12/16/2022  Pt to be independent with final HEP for best care for knees and hip/back.   Goal status: INITIAL  2.  Pt to report decreased pain in R hip to 0-3/10 at night and with standing activity , to improve ability for community activity   Goal  status: INITIAL  3.  Pt to demo ability for sit to stand with minor use of hands from regular chair height , to improve ability for IADLs and work duties .  Goal status: INITIAL  4.  Pt to demo improved strength of bil hip abd to at least 4+/5 to improve stability , gait and pain.    Goal status: INITIAL    PLAN:  PT FREQUENCY: 2x/week  PT DURATION: 8  weeks  PLANNED INTERVENTIONS: Therapeutic exercises, Therapeutic activity, Neuromuscular re-education, Balance training, Gait training, Patient/Family education, Self Care, Joint mobilization, Joint manipulation, DME instructions, Aquatic Therapy, Dry Needling, Electrical stimulation, Spinal manipulation, Spinal mobilization, Cryotherapy, Moist heat, Taping, Traction, Ultrasound, Ionotophoresis '4mg'$ /ml Dexamethasone, and Manual therapy  PLAN FOR NEXT SESSION:  LTR, Seated HSS,  knee flexion with strap, manual knee flexion/ hip extension,  hip abd strength. Manual for R hip gr troch and glue    Lyndee Hensen, PT, DPT 10:46 AM  10/21/22

## 2022-10-26 ENCOUNTER — Telehealth: Payer: Self-pay | Admitting: Cardiovascular Disease

## 2022-10-26 NOTE — Telephone Encounter (Signed)
Patient schedule message comment:  "Fatigue back pain stomach"

## 2022-10-26 NOTE — Telephone Encounter (Signed)
Left message for patient to call back  

## 2022-10-27 ENCOUNTER — Encounter: Payer: Medicare HMO | Admitting: Physical Therapy

## 2022-10-29 ENCOUNTER — Encounter: Payer: Medicare HMO | Admitting: Physical Therapy

## 2022-11-01 ENCOUNTER — Encounter: Payer: Self-pay | Admitting: Internal Medicine

## 2022-11-01 ENCOUNTER — Encounter: Payer: Self-pay | Admitting: Physical Therapy

## 2022-11-01 ENCOUNTER — Ambulatory Visit (INDEPENDENT_AMBULATORY_CARE_PROVIDER_SITE_OTHER): Payer: Medicare HMO | Admitting: Internal Medicine

## 2022-11-01 ENCOUNTER — Ambulatory Visit: Payer: Medicare HMO | Admitting: Physical Therapy

## 2022-11-01 VITALS — BP 130/88 | HR 57 | Temp 97.9°F | Ht 67.0 in | Wt 180.2 lb

## 2022-11-01 DIAGNOSIS — K5790 Diverticulosis of intestine, part unspecified, without perforation or abscess without bleeding: Secondary | ICD-10-CM

## 2022-11-01 DIAGNOSIS — I700 Atherosclerosis of aorta: Secondary | ICD-10-CM | POA: Insufficient documentation

## 2022-11-01 DIAGNOSIS — M25551 Pain in right hip: Secondary | ICD-10-CM | POA: Diagnosis not present

## 2022-11-01 DIAGNOSIS — Z91041 Radiographic dye allergy status: Secondary | ICD-10-CM

## 2022-11-01 DIAGNOSIS — M79641 Pain in right hand: Secondary | ICD-10-CM | POA: Insufficient documentation

## 2022-11-01 DIAGNOSIS — M5459 Other low back pain: Secondary | ICD-10-CM | POA: Diagnosis not present

## 2022-11-01 DIAGNOSIS — M25661 Stiffness of right knee, not elsewhere classified: Secondary | ICD-10-CM

## 2022-11-01 DIAGNOSIS — M25662 Stiffness of left knee, not elsewhere classified: Secondary | ICD-10-CM | POA: Diagnosis not present

## 2022-11-01 DIAGNOSIS — M5134 Other intervertebral disc degeneration, thoracic region: Secondary | ICD-10-CM | POA: Insufficient documentation

## 2022-11-01 DIAGNOSIS — R1010 Upper abdominal pain, unspecified: Secondary | ICD-10-CM | POA: Insufficient documentation

## 2022-11-01 DIAGNOSIS — M25511 Pain in right shoulder: Secondary | ICD-10-CM

## 2022-11-01 DIAGNOSIS — K5904 Chronic idiopathic constipation: Secondary | ICD-10-CM | POA: Insufficient documentation

## 2022-11-01 DIAGNOSIS — M19049 Primary osteoarthritis, unspecified hand: Secondary | ICD-10-CM | POA: Insufficient documentation

## 2022-11-01 DIAGNOSIS — M79642 Pain in left hand: Secondary | ICD-10-CM | POA: Insufficient documentation

## 2022-11-01 HISTORY — DX: Upper abdominal pain, unspecified: R10.10

## 2022-11-01 HISTORY — DX: Diverticulosis of intestine, part unspecified, without perforation or abscess without bleeding: K57.90

## 2022-11-01 HISTORY — DX: Pain in right shoulder: M25.511

## 2022-11-01 HISTORY — DX: Atherosclerosis of aorta: I70.0

## 2022-11-01 MED ORDER — PREDNISONE 50 MG PO TABS: 50.0000 mg | ORAL_TABLET | Freq: Four times a day (QID) | ORAL | 0 refills | Status: AC

## 2022-11-01 MED ORDER — CVS FIBER GUMMY BEARS CHILDREN PO CHEW: 20.0000 g | CHEWABLE_TABLET | Freq: Every day | ORAL | 11 refills | Status: DC

## 2022-11-01 MED ORDER — DIPHENHYDRAMINE HCL 50 MG PO TABS: 50.0000 mg | ORAL_TABLET | Freq: Once | ORAL | 0 refills | Status: DC

## 2022-11-01 MED ORDER — SENNA-DOCUSATE SODIUM 8.6-50 MG PO TABS: 1.0000 | ORAL_TABLET | Freq: Every day | ORAL | 3 refills | Status: DC

## 2022-11-01 MED ORDER — TRAMADOL HCL 50 MG PO TABS: 50.0000 mg | ORAL_TABLET | Freq: Three times a day (TID) | ORAL | 0 refills | Status: AC | PRN

## 2022-11-01 NOTE — Patient Instructions (Addendum)
It was a pleasure seeing you today!  Your health and satisfaction are my top priorities. If you believe your experience today was worthy of a 5-star rating, I'd be grateful for your feedback! Loralee Pacas, MD   [x]$    Schedule next appointment(s):    Return in about 1 month (around 11/30/2022) for f/u upper abdominal pain ct results and laxative trial.  Any requested lab visits should be scheduled as appointments too  If you are not doing well:  Return to the office sooner Please bring all your medicine bottles to each appointment If your condition begins to worsen or become severe:  go to the emergency room or even call 911  [x]$   Start taking fiber well gummies and senokot s for constipation chronic... might help pain.   [x]$   (Optional):  Review your clinical notes on MyChart after they are completed.     Today's draft of the physician documented plan for today's visit: (final revisions will be visible on MyChart chart later) DDD (degenerative disc disease), thoracic  Upper abdominal pain Assessment & Plan: Reviewed the x-ray and I personally reinterpreted it and in my opinion the disc disease is actually moderate to severe in terms of the anterior vertebral space in the upper lumbar and lower thoracic region that would innervate the dermatomes of the affected epigastric pain.  I therefore explained that I think that her abdominal pain could be either direct pain from worn-out disks in her back or indirect from worsening constipation in her upper abdomen from the nerves not being able to work well there from the back problem.  Will try conservative management with the laxatives and stool softeners and if that works great but if not then I think we should get a CT upper abdomen.  Would have gone ahead and ordered the CAT scan abdomen but she has a contrast allergy so I want to wait and see if we can get better with just the constipation treatment because it is going to be a major problem to  get it all arranged and get the steroid treatment arranged.  I asked her to call me back in 1 week and if still having upper abdominal pain despite the meds and I will order the CAT scan with contrast with premedication  Orders: -     CT ABDOMEN PELVIS W CONTRAST; Future -     Amylase -     Lipase -     CBC -     Comprehensive metabolic panel -     traMADol HCl; Take 1 tablet (50 mg total) by mouth every 8 (eight) hours as needed for up to 5 days.  Dispense: 15 tablet; Refill: 0  Chronic idiopathic constipation -     Senna-Docusate Sodium; Take 1 tablet by mouth daily.  Dispense: 90 tablet; Refill: 3 -     [employer] Fiber Gummy Bears Children; Chew 20 g by mouth daily at 6 (six) AM.  Dispense: 100 tablet; Refill: 11  Aortic atherosclerosis (H[facility])  Diverticular disease      QUESTIONS & CONCERNS: CLINICAL: please contact us via phone [phone] OR MyChart messaging  LAB & IMAGING:   We will call you if the results are significantly abnormal or you don't use MyChart.  Most normal results will be posted to MyChart immediately and have a clinical review message by Dr. Randol Kern posted within 2-3 business days.   If you have not heard from [name] regarding the results in 2 weeks OR if  you need priority reporting, please contact this office. MYCHART:  The fastest way to get your results and easiest way to stay in touch with [name] is by activating your My Chart account. Instructions are located on the last page of this paperwork.  BILLING: xray and lab orders are billed from separate companies and questions./concerns should be directed to the [facility].  For visit charges please discuss with our administrative services COMPLAINTS:  please let Dr. Randol Kern know or see the [place], by asking at the front desk: we want you to be satisfied with every experience and we would be grateful for the opportunity to address any problems

## 2022-11-01 NOTE — Progress Notes (Signed)
[name]: [id number]   Routine Medical Office Visit  Patient:  [name]      Age: 72 y.o.       Sex:  female  Date:   11/01/2022  PCP:    [name], [place] Provider: Loralee Pacas, MD   Problem Focused Charting:   Medical Decision Making per Assessment/Plan   [name] was seen today for abdominal pain.  Upper abdominal pain Overview: Radiates to the back has been present since late 2023 and we do not have a CAT scan yet for it.  X-ray abdomen showed only degenerative disc disease and chronic constipation.    Assessment & Plan: Main concern.  Constant severe, ibuprofen 800 not helping, will give some tramadol but need to avoid constipating medications. Taking proton pump inhibitor (PPI) stomach acid reducer so doubt gastroesophageal reflux disease related Reviewed the x-ray and I personally reinterpreted it and in my opinion the disc disease is actually moderate to severe in terms of the anterior vertebral space in the upper lumbar and lower thoracic region that would innervate the dermatomes of the affected epigastric pain.  I therefore explained that I think that her abdominal pain could be either direct pain from worn-out disks in her back or indirect from worsening constipation in her upper abdomen from the nerves not being able to work well there from the back problem.  Will try conservative management with the laxatives and stool softeners and if that works great but if not then I think we should get a CT upper abdomen.  Would have gone ahead and ordered the CAT scan abdomen but she has a contrast allergy so I want to wait and see if we can get better with just the constipation treatment because it is going to be a major problem to get it all arranged and get the steroid treatment arranged.  I asked her to call me back in 1 week and if still having upper abdominal pain despite the meds and I will order the CAT scan with  contrast with premedication   Orders: -     CT ABDOMEN PELVIS W CONTRAST; Future -     Amylase -     Lipase -     CBC -     Comprehensive metabolic panel -     traMADol HCl; Take 1 tablet (50 mg total) by mouth every 8 (eight) hours as needed for up to 5 days.  Dispense: 15 tablet; Refill: 0  Chronic idiopathic constipation -     Senna-Docusate Sodium; Take 1 tablet by mouth daily.  Dispense: 90 tablet; Refill: 3 -     [facility] Fiber Gummy Bears Children; Chew 20 g by mouth daily at 6 (six) AM.  Dispense: 100 tablet; Refill: 11  Aortic atherosclerosis ([place]) Overview: On 2022 CT abdomen.   Diverticular disease Overview: On 2022 CT abd   DDD (degenerative disc disease), thoracic Overview:  Based on reinterpretation of this xray while evalg upper abdomen pain    Medical Decision Making: 1 or more chronic illnesses with exacerbation,  progression, or side effects of treatment 1 undiagnosed new problem with uncertain prognosis Tests, documents, or independent historian(s). Any combination of 3 from the following:      Ordering of each unique test; Independent interpretation of a test performed by another physician; imaging such as XR or EKG reinterpretations Prescription drug management     Today's key discussion points and After Visit Summary (AVS)  reminders. Problem-associated medical records were reviewed with her during the appointment She was encouraged to contact our office by phone or message via [place] if she has any questions or concerns regarding our treatment plan (see AVS). She was given an opportunity to ask questions/clarifications about any aspect of the diagnosis and treatment plan at today's visit.  We discussed red flag symptoms and signs in detail and when to call the office or go to ER if her condition worsens (see AVS). She expressed understanding and AVS is used to reinforce.  Review of this encounter document for accuracy and understanding is encouraged in the  AVS. Follow up recommended:  Return in about 1 month (around 11/30/2022) for f/u upper abdominal pain ct results and laxative trial. Risks and benefits of prescribed controlled substance use were discussed in detail.  she was made aware of risks of addiction.  Subjective - Clinical Presentation:   [name] is a 72 y.o. female  Patient Active Problem List   Diagnosis Date Noted   Acute pain of right shoulder 11/01/2022   Hand pain, left 11/01/2022   Hand pain, right 11/01/2022   Aortic atherosclerosis ([place]) 11/01/2022   Diverticular disease 11/01/2022   Upper abdominal pain 11/01/2022   Chronic idiopathic constipation 11/01/2022   DDD (degenerative disc disease), 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/2022   Primary localized osteoarthrosis of multiple sites 09/08/2022   Rheumatoid arthritis ([place]) 09/08/2022   GERD (gastroesophageal reflux disease) 09/08/2022   Prediabetes 09/08/2022   Difficulty standing 09/08/2022   Lower extremity edema 01/11/2022   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   Class 1 obesity due to excess calories with serious comorbidity and body mass index (BMI) of 32.0 to 32.9 in adult 02/17/2021   Psychophysiological insomnia 02/17/2021   Hyperlipidemia with target LDL less than 130 12/30/2020   LVH (left ventricular hypertrophy) due to hypertensive disease, without heart failure 12/30/2020   History of breast cancer 08/07/2018   Family history of coronary artery disease in father 08/07/2018   Family history of breast cancer 10/24/2017   Personal history of colon cancer 10/24/2017   Pain in joint 04/22/2009   Malignant neoplasm of upper-outer quadrant of right breast in female, estrogen receptor positive ([place]) 01/11/2008   Hiatal hernia 12/14/2007   DIVERTICULOSIS, COLON 12/14/2007   Essential hypertension 11/03/2007   Lupus ([place]) 11/03/2007   Flank pain 11/03/2007   Past Medical History:  Diagnosis Date   Aortic atherosclerosis ([place]) 11/01/2022   On 2022 CT abdomen.    Arthritis    R knee, hands    Back pain    Blood transfusion    1986- post-partial hysterectomy   Breast cancer ([place]) 2009   right lumpectomy and radiation   Cataract    Chicken pox    Colon cancer ([place]) 2003   per pt report, cancer found in a colon polyp in 2003, procedure done in [place].  did not require surgery, chemo etc.     Depression    Difficulty standing 09/08/2022   Due to hip pain mainly- she agreed to physical therapy if insurance will cover   Diverticular disease 11/01/2022   On 2022 CT abd   Diverticulitis    Edema of both ankles    Family history of breast cancer 10/24/2017   Gallbladder problem    Gastritis    GERD (gastroesophageal reflux disease)    recently taken off anti reflux tx   Hepatitis    Hep. A- 1978   Hiatal hernia    History of  stomach ulcers    Hypertension    Joint pain    Lactose intolerance    Lower extremity edema 01/11/2022   Lupus ([place])    Multiple gastric ulcers    Palpitations    Personal history of radiation therapy 2009   Prediabetes 09/08/2022   Lab Results Component Value Date  HGBA1C 5.7 (H) 06/29/2021     Rheumatic fever    Rheumatoid arthritis (HCC)    SLE (systemic lupus erythematosus related syndrome) (HCC)    Urinary incontinence    UTI (urinary tract infection)     Outpatient Medications Prior to Visit  Medication Sig   hydrochlorothiazide (HYDRODIURIL) 25 MG tablet Take 1 tablet (25 mg total) by mouth daily.   hydroxychloroquine (PLAQUENIL) 200 MG tablet Take 200 mg by mouth 2 (two) times daily.   ibuprofen (ADVIL) 800 MG tablet Take 800 mg by mouth every 8 (eight) hours as needed.   losartan (COZAAR) 25 MG tablet Take 1 tablet (25 mg total) by mouth daily.   metoprolol succinate (TOPROL-XL) 50 MG 24 hr tablet Take 1 tablet (50 mg total) by mouth daily.   Multiple Vitamins-Minerals (EMERGEN-C IMMUNE PLUS PO) Take 1 tablet by mouth daily.   omeprazole (PRILOSEC) 40 MG capsule Take 1 capsule (40 mg total) by mouth  daily.   potassium chloride (KLOR-CON) 10 MEQ tablet Take 1 tablet (10 mEq total) by mouth daily.   rosuvastatin (CRESTOR) 40 MG tablet Take 1 tablet (40 mg total) by mouth daily.   No facility-administered medications prior to visit.    Chief Complaint  Patient presents with   Abdominal Pain    Worse, radiates to back.    HPI  Presents for poorly localized but mostly upper abdominal pain, persistent She says it sort of spreads around to the back and from the upper mid around to the sides and a little bit towards lower abdomen The pain is intense and constant Her physical therapy wanted her seen because it worsen with leaning forward The quality is difficult to describe Present since late 2023 Known hiatal hernia Ibuprofen tylenol don't help... fentanyl in emergency room helped in 3 minutes Takes proton pump inhibitor (PPI) stomach acid reducer, and not taking ibuprofen anymore Recently started on plaquenil from rheum Associated with chronic lifelong idiopathic constipation and hard stools also Also relevantly associated with history of breast cancer 2009 and lupus and gastroesophageal reflux disease. 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       Objective:  Physical Exam  BP 130/88 (BP Location: Left Arm, Patient Position: Sitting)   Pulse (!) 57   Temp 97.9 F (36.6 C) (Temporal)   Ht 5' 7"$  (1.702 m)   Wt 180 lb 3.2 oz (81.7 kg)   SpO2 94%   BMI 28.22 kg/m   Overweight  by BMI criteria but truncal adiposity (waist circumference or caliper) should be used instead. Wt Readings from Last 10 Encounters:  11/01/22 180 lb 3.2 oz (81.7 kg)  09/08/22 186 lb 6.4 oz (84.6 kg)  08/16/22 185 lb 3.2 oz (84 kg)  06/29/22 187 lb 1.6 oz (84.9 kg)  06/04/22 180 lb (81.6 kg)  04/12/22 188 lb (85.3 kg)  01/11/22 188 lb 1.6 oz (85.3 kg)  01/03/22 187 lb 6.4 oz (85 kg)  12/28/21 187 lb 6.4 oz (85 kg)  11/16/21 186 lb (84.4 kg)    Vital signs reviewed.  Nursing notes reviewed. Weight trend reviewed. General Appearance:  Well developed, well  nourished female in no acute distress.   Normal work of breathing at rest Musculoskeletal: All extremities are intact.  Neurological:  Awake, alert,  No obvious focal neurological deficits or cognitive impairments Psychiatric:  Appropriate mood, pleasant demeanor Problem-specific findings:  upper abdomen pain   Results Reviewed: No results found for any visits on 11/01/22.  Recent Results (from the past 2160 hour(s))  CBC     Status: None   Collection Time: 09/08/22 12:37 PM  Result Value Ref Range   WBC 4.4 4.0 - 10.5 K/uL   RBC 4.16 3.87 - 5.11 Mil/uL   Platelets 214.0 150.0 - 400.0 K/uL   Hemoglobin 12.9 12.0 - 15.0 g/dL   HCT 39.1 36.0 - 46.0 %   MCV 93.9 78.0 - 100.0 fl   MCHC 32.9 30.0 - 36.0 g/dL   RDW 13.9 11.5 - 15.5 %  Comp Met (CMET)     Status: None   Collection Time: 09/08/22 12:37 PM  Result Value Ref Range   Sodium 139 135 - 145 mEq/L   Potassium 4.0 3.5 - 5.1 mEq/L   Chloride 104 96 - 112 mEq/L   CO2 28 19 - 32 mEq/L   Glucose, Bld 86 70 - 99 mg/dL   BUN 12 6 - 23 mg/dL   Creatinine, Ser 0.69 0.40 - 1.20 mg/dL   Total Bilirubin 0.5 0.2 - 1.2 mg/dL   Alkaline Phosphatase 63 39 - 117 U/L   AST 26 0 - 37 U/L   ALT 20 0 - 35 U/L   Total Protein 7.2 6.0 - 8.3 g/dL   Albumin 4.0 3.5 - 5.2 g/dL   GFR 86.01 >60.00 mL/min    Comment: Calculated using the CKD-EPI Creatinine Equation (2021)   Calcium 10.0 8.4 - 10.5 mg/dL  Urinalysis, Routine w reflex microscopic     Status: Abnormal   Collection Time: 09/08/22 12:37 PM  Result Value Ref Range   Color, Urine YELLOW Yellow;Lt. Yellow;Straw;Dark Yellow;Amber;Green;Red;Brown   APPearance CLEAR Clear;Turbid;Slightly Cloudy;Cloudy   Specific Gravity, Urine 1.015 1.000 - 1.030   pH 6.5 5.0 - 8.0   Total Protein, Urine NEGATIVE Negative   Urine Glucose NEGATIVE Negative   Ketones, ur NEGATIVE Negative    Bilirubin Urine NEGATIVE Negative   Hgb urine dipstick NEGATIVE Negative   Urobilinogen, UA 1.0 0.0 - 1.0   Leukocytes,Ua NEGATIVE Negative   Nitrite NEGATIVE Negative   WBC, UA 0-2/hpf 0-2/hpf   RBC / HPF 0-2/hpf 0-2/hpf   Squamous Epithelial / HPF Rare(0-4/hpf) Rare(0-4/hpf)   Renal Epithel, UA None Seen None   Bacteria, UA Rare(<10/hpf) (A) None  HgB A1c     Status: None   Collection Time: 09/08/22 12:37 PM  Result Value Ref Range   Hgb A1c MFr Bld 5.6 4.6 - 6.5 %    Comment: Glycemic Control Guidelines for People with Diabetes:Non Diabetic:  <6%Goal of Therapy: <7%Additional Action Suggested:  >8%        Original review: FINDINGS: There is no evidence of lumbar spine fracture. Mild curvature of spine. Mild narrow intervertebral spaces are identified in the lower lumbar spine. Facet joint sclerosis is identified in the mid and lower lumbar spine.   IMPRESSION: Mild degenerative joint changes of lumbar spine.  I attest to reviewing and reinterpreting the following images with the patient.   In my medical opinion there is severe narrowing of intervetebral spaces in the thoracic spine, which may contribute to her epigastric pain radiating to back     Signed: Thurmond Butts  Diana Eves, MD 11/01/2022 12:23 PM

## 2022-11-01 NOTE — Therapy (Signed)
OUTPATIENT PHYSICAL THERAPY LOWER EXTREMITY TREATMENT   Patient Name: [name] MRN: [id number] DOB:12-05-1950, 72 y.o., female Today's Date: 11/01/2022  END OF SESSION:  PT End of Session - 11/01/22 0933     Visit Number 2    Number of Visits 16    Date for PT Re-Evaluation 12/16/22    Authorization Type [employer]    PT Start Time [phone]    PT Stop Time 1012    PT Time Calculation (min) 38 min    Activity Tolerance Patient tolerated treatment well    Behavior During Therapy [facility] for tasks assessed/performed             Past Medical History:  Diagnosis Date   Arthritis    R knee, hands    Back pain    Blood transfusion    1986- post-partial hysterectomy   Breast cancer ([place]) 2009   right lumpectomy and radiation   Cataract    Chicken pox    Colon cancer ([place]) 2003   per pt report, cancer found in a colon polyp in 2003, procedure done in [place].  did not require surgery, chemo etc.     Depression    Difficulty standing 09/08/2022   Due to hip pain mainly- she agreed to physical therapy if insurance will cover   Diverticulitis    Edema of both ankles    Family history of breast cancer 10/24/2017   Gallbladder problem    Gastritis    GERD (gastroesophageal reflux disease)    recently taken off anti reflux tx   Hepatitis    Hep. A- 1978   Hiatal hernia    History of stomach ulcers    Hypertension    Joint pain    Lactose intolerance    Lower extremity edema 01/11/2022   Lupus ([place])    Multiple gastric ulcers    Palpitations    Personal history of radiation therapy 2009   Prediabetes 09/08/2022   Lab Results Component Value Date  HGBA1C 5.7 (H) 06/29/2021     Rheumatic fever    Rheumatoid arthritis ([place])    SLE (systemic lupus erythematosus related syndrome) ([place])    Urinary incontinence    UTI (urinary tract infection)    Past Surgical History:  Procedure Laterality Date   [place] & tubal  ligation    BIOPSY  07/25/2018   Procedure: BIOPSY;  Surgeon: Milus Banister, MD;  Location: [facility] ENDOSCOPY;  Service: Endoscopy;;   BREAST EXCISIONAL BIOPSY Left 1998   BREAST LUMPECTOMY Bilateral    2009   BREAST SURGERY     R breast lumpectomy   CHOLECYSTECTOMY     1978- open    ESOPHAGOGASTRODUODENOSCOPY (EGD) WITH PROPOFOL N/A 07/25/2018   Procedure: ESOPHAGOGASTRODUODENOSCOPY (EGD) WITH PROPOFOL;  Surgeon: Milus Banister, MD;  Location: [facility];  Service: Endoscopy;  Laterality: N/A;   REPLACEMENT TOTAL KNEE Left    TONSILLECTOMY     1954   TOTAL KNEE ARTHROPLASTY  08/30/2011   Procedure: TOTAL KNEE ARTHROPLASTY;  Surgeon: Kerin Salen;  Location: [facility];  Service: Orthopedics;  Laterality: Right;  Right Total Knee Arthroplasty   TUBAL LIGATION     Patient Active Problem List   Diagnosis Date Noted   Primary localized osteoarthrosis of multiple sites 09/08/2022   Rheumatoid arthritis ([place]) 09/08/2022   GERD (gastroesophageal reflux disease) 09/08/2022   Prediabetes 09/08/2022   Difficulty standing  09/08/2022   Lower extremity edema 01/11/2022   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   Class 1 obesity due to excess calories with serious comorbidity and body mass index (BMI) of 32.0 to 32.9 in adult 02/17/2021   Psychophysiological insomnia 02/17/2021   Hyperlipidemia with target LDL less than 130 12/30/2020   LVH (left ventricular hypertrophy) due to hypertensive disease, without heart failure 12/30/2020   History of breast cancer 08/07/2018   Family history of coronary artery disease in father 08/07/2018   Family history of breast cancer 10/24/2017   Personal history of colon cancer 10/24/2017   Pain in joint 04/22/2009   Malignant neoplasm of upper-outer quadrant of right breast in female, estrogen receptor positive ([place]) 01/11/2008   Hiatal hernia 12/14/2007   DIVERTICULOSIS, COLON 12/14/2007   Essential hypertension 11/03/2007   Lupus ([place]) 11/03/2007    Flank pain 11/03/2007    PCP: Berniece Pap  REFERRING PROVIDER: Berniece Pap  REFERRING DIAG: Difficulty in walking   THERAPY DIAG:  Pain in right hip  Other low back pain  Stiffness of left knee, not elsewhere classified  Stiffness of right knee, not elsewhere classified  Rationale for Evaluation and Treatment: Rehabilitation  ONSET DATE: 1 year ago.   SUBJECTIVE:   SUBJECTIVE STATEMENT:  11/01/2022 Pt states less soreness in hip. She is sore all over from recent lupus flare up, and she also had the flu.   Eval:Pt reports R hip pain about 1 year, can't sleep on R side. Can sleep on L comfortably. Also pain with Standing for long time, walking. Better with movement.  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  She does not use No AD. Does not live alone.  [id number]: hands more, is on new meds- not sure if working yet.  Rash at times, fevers, 3x/yr flares.  She also reports increased pain below sternum in abdomen, when bending over cleaning. She has hiatal hernia but has not previously had any symptoms from it.    PERTINENT HISTORY: Lupus, RA, bil TKA,  Previous breast CA,colon  PAIN:  Are you having pain? Yes: NPRS scale: up to 7 /10 Pain location: R hip, R low back Pain description: same Aggravating factors: Sleeping on R side, Standing  Relieving factors: rest  PRECAUTIONS: None  WEIGHT BEARING RESTRICTIONS: No  FALLS:  Has patient fallen in last 6 months? Yes. Number of falls 1 - yesterday, tripped up step, fell into bush, did not hurt herself.    PLOF: Independent  PATIENT GOALS: Less pain in hip/back.   NEXT MD VISIT:   OBJECTIVE:   DIAGNOSTIC FINDINGS: Recent x-ray for Back and hip.   PATIENT SURVEYS: Foto: will do next session  FOTO:  COGNITION: Overall cognitive status: Within functional limits for tasks assessed     SENSATION: WFL  POSTURE:    PALPATION:  hypomobile bil knee, L>R  knee  (flexion) ;  Very tender R gr trochanter, mild tenderness into R glute, soreness in R SI, minimal pain in back.    LOWER EXTREMITY ROM:  Active ROM Right eval Left eval  Hip flexion wfl wfl  Hip extension Mod limitation Mod limitation  Hip abduction Mild limitation Mild limitation  Hip adduction    Hip internal rotation    Hip external rotation Mild limitation   Knee flexion 105 85  Knee extension 0 -5  Ankle dorsiflexion    Ankle plantarflexion    Ankle inversion    Ankle eversion     (  Blank rows = not tested)  LOWER EXTREMITY MMT:  MMT Right eval Left eval  Hip flexion 4- 4-  Hip extension    Hip abduction 4- (pain) 4-  Hip adduction    Hip internal rotation    Hip external rotation    Knee flexion 4 4  Knee extension 4 4  Ankle dorsiflexion    Ankle plantarflexion    Ankle inversion    Ankle eversion     (Blank rows = not tested)  LOWER EXTREMITY SPECIAL TESTS:  Neg faber, fadir,   + tenderness at gr troch  FUNCTIONAL TESTS:  Sit to stand: in efficient mechanics, not using/bending knees , likely due to stiffness, increased hinging from back/hips.   GAIT: Distance walked: 100 Assistive device utilized: None Level of assistance: Complete Independence Comments: unremarkable   TODAY'S TREATMENT:                                                                                                                              DATE:   11/01/22: Therapeutic Exercise: Aerobic: Supine:Clams GTB x 20;  TA with education on achieving contraction; SLR x 10 bil;  Seated: sit to stand x 10 with education on optimal mechanics.  Standing:  Hip abd 1 x 10 bil;  Stretches:  Supine SKTC 30 sec x 3 bil; LTR x 10;  hip ER fallouts x 10;  Neuromuscular Re-education: Manual Therapy: Therapeutic Activity: Self Care:  10/21/22: Therapeutic Exercise: Aerobic: Supine: Seated: sit to stand 3 x 5 with education on optimal mechanics.  Standing: Stretches:  Supine SKTC 30 sec x 3  bil; R piriformis (modified ) x 2 min;  hip ER fallouts x 10;  Neuromuscular Re-education: Manual Therapy: Therapeutic Activity: Self Care:   PATIENT EDUCATION:  Education details: reviewed HEP Person educated: Patient Education method: Explanation, Demonstration, Tactile cues, Verbal cues, and Handouts Education comprehension: verbalized understanding, returned demonstration, verbal cues required, tactile cues required, and needs further education  HOME EXERCISE PROGRAM:  Access Code: [email or web address] URL: [email or web address] Date: 10/21/2022 Prepared by: Lyndee Hensen  Exercises - Hooklying Single Knee to Chest Stretch  - 2 x daily - 3 reps - 30 hold - Bent Knee Fallouts  - 2 x daily - 1 sets - 10 reps - 5 hold - Sit to Stand  - 1 x daily - 1 sets - 10 reps  ASSESSMENT:  CLINICAL IMPRESSION: 11/01/2022    OBJECTIVE IMPAIRMENTS: Abnormal gait, decreased activity tolerance, decreased mobility, difficulty walking, decreased ROM, decreased strength, hypomobility, impaired flexibility, improper body mechanics, and pain.   ACTIVITY LIMITATIONS: bending, sitting, standing, squatting, sleeping, stairs, transfers, and locomotion level  PARTICIPATION LIMITATIONS: meal prep, cleaning, laundry, community activity, and yard work  PERSONAL FACTORS: Past/current experiences Knee stiffness from previous TKA, are also affecting patient's functional outcome.   REHAB POTENTIAL: Good  CLINICAL DECISION MAKING: Stable/uncomplicated  EVALUATION COMPLEXITY: Low   GOALS: Goals reviewed with patient? Yes  SHORT TERM GOALS: Target  date: 11/04/2022  Pt to be independent with initial HEP  Goal status: INITIAL  2. Pt to demo ability for optimal mechanics for sit to stand from higher mat table.   Goal status: INITIAL    LONG TERM GOALS: Target date: 12/16/2022  Pt to be independent with final HEP for best care for knees and hip/back.   Goal status: INITIAL  2.  Pt to  report decreased pain in R hip to 0-3/10 at night and with standing activity , to improve ability for community activity   Goal status: INITIAL  3.  Pt to demo ability for sit to stand with minor use of hands from regular chair height , to improve ability for IADLs and work duties .  Goal status: INITIAL  4.  Pt to demo improved strength of bil hip abd to at least 4+/5 to improve stability , gait and pain.    Goal status: INITIAL    PLAN:  PT FREQUENCY: 2x/week  PT DURATION: 8 weeks  PLANNED INTERVENTIONS: Therapeutic exercises, Therapeutic activity, Neuromuscular re-education, Balance training, Gait training, Patient/Family education, Self Care, Joint mobilization, Joint manipulation, DME instructions, Aquatic Therapy, Dry Needling, Electrical stimulation, Spinal manipulation, Spinal mobilization, Cryotherapy, Moist heat, Taping, Traction, Ultrasound, Ionotophoresis 66m/ml Dexamethasone, and Manual therapy  PLAN FOR NEXT SESSION:  LTR, Seated HSS,  knee flexion with strap, manual knee flexion/ hip extension,  hip abd strength. Manual for R hip gr troch and glue    LLyndee Hensen PT, DPT 10:10 AM  11/01/22

## 2022-11-01 NOTE — Assessment & Plan Note (Addendum)
Main concern.  Constant severe, ibuprofen 800 not helping, will give some tramadol but need to avoid constipating medications. Taking proton pump inhibitor (PPI) stomach acid reducer so doubt gastroesophageal reflux disease related Reviewed the x-ray and I personally reinterpreted it and in my opinion the disc disease is actually moderate to severe in terms of the anterior vertebral space in the upper lumbar and lower thoracic region that would innervate the dermatomes of the affected epigastric pain.  I therefore explained that I think that her abdominal pain could be either direct pain from worn-out disks in her back or indirect from worsening constipation in her upper abdomen from the nerves not being able to work well there from the back problem.  Will try conservative management with the laxatives and stool softeners and if that works great but if not then I think we should get a CT upper abdomen.  Would have gone ahead and ordered the CAT scan abdomen but she has a contrast allergy so I want to wait and see if we can get better with just the constipation treatment because it is going to be a major problem to get it all arranged and get the steroid treatment arranged.  I asked her to call me back in 1 week and if still having upper abdominal pain despite the meds and I will order the CAT scan with contrast with premedication

## 2022-11-02 ENCOUNTER — Telehealth: Payer: Self-pay | Admitting: Internal Medicine

## 2022-11-02 NOTE — Telephone Encounter (Signed)
Pt states she is allergic to Iodine and needs and RX for allergies so she can do the CT scan. Please advise.

## 2022-11-03 ENCOUNTER — Encounter: Payer: Self-pay | Admitting: Physical Therapy

## 2022-11-03 ENCOUNTER — Ambulatory Visit: Payer: Medicare HMO | Admitting: Physical Therapy

## 2022-11-03 DIAGNOSIS — M25661 Stiffness of right knee, not elsewhere classified: Secondary | ICD-10-CM

## 2022-11-03 DIAGNOSIS — M5459 Other low back pain: Secondary | ICD-10-CM

## 2022-11-03 DIAGNOSIS — M25551 Pain in right hip: Secondary | ICD-10-CM | POA: Diagnosis not present

## 2022-11-03 DIAGNOSIS — M25662 Stiffness of left knee, not elsewhere classified: Secondary | ICD-10-CM | POA: Diagnosis not present

## 2022-11-03 NOTE — Therapy (Signed)
OUTPATIENT PHYSICAL THERAPY LOWER EXTREMITY TREATMENT   Patient Name: [name] MRN: [id number] DOB:August 13, 1951, 72 y.o., female Today's Date: 11/03/2022  END OF SESSION:  PT End of Session - 11/03/22 1217     Visit Number 3    Number of Visits 16    Date for PT Re-Evaluation 12/16/22    Authorization Type [employer]    PT Start Time 1020    PT Stop Time 1100    PT Time Calculation (min) 40 min    Activity Tolerance Patient tolerated treatment well    Behavior During Therapy [facility] for tasks assessed/performed              Past Medical History:  Diagnosis Date   Aortic atherosclerosis ([place]) 11/01/2022   On 2022 CT abdomen.   Arthritis    R knee, hands    Back pain    Blood transfusion    1986- post-partial hysterectomy   Breast cancer ([place]) 2009   right lumpectomy and radiation   Cataract    Chicken pox    Colon cancer ([place]) 2003   per pt report, cancer found in a colon polyp in 2003, procedure done in [place].  did not require surgery, chemo etc.     Depression    Difficulty standing 09/08/2022   Due to hip pain mainly- she agreed to physical therapy if insurance will cover   Diverticular disease 11/01/2022   On 2022 CT abd   Diverticulitis    Edema of both ankles    Family history of breast cancer 10/24/2017   Gallbladder problem    Gastritis    GERD (gastroesophageal reflux disease)    recently taken off anti reflux tx   Hepatitis    Hep. A- 1978   Hiatal hernia    History of stomach ulcers    Hypertension    Joint pain    Lactose intolerance    Lower extremity edema 01/11/2022   Lupus ([place])    Multiple gastric ulcers    Palpitations    Personal history of radiation therapy 2009   Prediabetes 09/08/2022   Lab Results Component Value Date  HGBA1C 5.7 (H) 06/29/2021     Rheumatic fever    Rheumatoid arthritis ([place])    SLE (systemic lupus erythematosus related syndrome) ([place])    Urinary incontinence    UTI (urinary tract infection)     Past Surgical History:  Procedure Laterality Date   [place] & tubal ligation    BIOPSY  07/25/2018   Procedure: BIOPSY;  Surgeon: Milus Banister, MD;  Location: [facility] ENDOSCOPY;  Service: Endoscopy;;   BREAST EXCISIONAL BIOPSY Left 1998   BREAST LUMPECTOMY Bilateral    2009   BREAST SURGERY     R breast lumpectomy   CHOLECYSTECTOMY     1978- open    ESOPHAGOGASTRODUODENOSCOPY (EGD) WITH PROPOFOL N/A 07/25/2018   Procedure: ESOPHAGOGASTRODUODENOSCOPY (EGD) WITH PROPOFOL;  Surgeon: Milus Banister, MD;  Location: [place];  Service: Endoscopy;  Laterality: N/A;   REPLACEMENT TOTAL KNEE Left    TONSILLECTOMY     1954   TOTAL KNEE ARTHROPLASTY  08/30/2011   Procedure: TOTAL KNEE ARTHROPLASTY;  Surgeon: Kerin Salen;  Location: [place];  Service: Orthopedics;  Laterality: Right;  Right Total Knee Arthroplasty   TUBAL LIGATION     Patient Active Problem List   Diagnosis Date Noted   Acute pain of right  shoulder 11/01/2022   Hand pain, left 11/01/2022   Hand pain, right 11/01/2022   Aortic atherosclerosis ([place]) 11/01/2022   Diverticular disease 11/01/2022   Upper abdominal pain 11/01/2022   Chronic idiopathic constipation 11/01/2022   DDD (degenerative disc disease), 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/2022   Primary localized osteoarthrosis of multiple sites 09/08/2022   Rheumatoid arthritis ([place]) 09/08/2022   GERD (gastroesophageal reflux disease) 09/08/2022   Prediabetes 09/08/2022   Difficulty standing 09/08/2022   Lower extremity edema 01/11/2022   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   Class 1 obesity due to excess calories with serious comorbidity and body mass index (BMI) of 32.0 to 32.9 in adult 02/17/2021   Psychophysiological insomnia 02/17/2021   Hyperlipidemia with target LDL less than 130 12/30/2020   LVH (left ventricular hypertrophy) due to hypertensive disease, without heart failure 12/30/2020   History of breast cancer  08/07/2018   Family history of coronary artery disease in father 08/07/2018   Family history of breast cancer 10/24/2017   Personal history of colon cancer 10/24/2017   Pain in joint 04/22/2009   Malignant neoplasm of upper-outer quadrant of right breast in female, estrogen receptor positive ([place]) 01/11/2008   Hiatal hernia 12/14/2007   DIVERTICULOSIS, COLON 12/14/2007   Essential hypertension 11/03/2007   Lupus ([place]) 11/03/2007   Flank pain 11/03/2007    PCP: Berniece Pap  REFERRING PROVIDER: Berniece Pap  REFERRING DIAG: Difficulty in walking   THERAPY DIAG:  Pain in right hip  Other low back pain  Stiffness of left knee, not elsewhere classified  Stiffness of right knee, not elsewhere classified  Rationale for Evaluation and Treatment: Rehabilitation  ONSET DATE: 1 year ago.   SUBJECTIVE:   SUBJECTIVE STATEMENT:  11/03/2022 Pt states soreness in back/hip when sleeping on that side, and also when up walking around during the day.   Eval:Pt reports R hip pain about 1 year, can't sleep on R side. Can sleep on L comfortably. Also pain with Standing for long time, walking. Better with movement.  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  She does not use No AD. Does not live alone.  [id number]: hands more, is on new meds- not sure if working yet.  Rash at times, fevers, 3x/yr flares.  She also reports increased pain below sternum in abdomen, when bending over cleaning. She has hiatal hernia but has not previously had any symptoms from it.    PERTINENT HISTORY: Lupus, RA, bil TKA,  Previous breast CA,colon  PAIN:  Are you having pain? Yes: NPRS scale: up to 7 /10 Pain location: R hip, R low back Pain description: same Aggravating factors: Sleeping on R side, Standing  Relieving factors: rest  PRECAUTIONS: None  WEIGHT BEARING RESTRICTIONS: No  FALLS:  Has patient fallen in last 6 months? Yes. Number of falls 1 -  yesterday, tripped up step, fell into bush, did not hurt herself.    PLOF: Independent  PATIENT GOALS: Less pain in hip/back.   NEXT MD VISIT:   OBJECTIVE:   DIAGNOSTIC FINDINGS: Recent x-ray for Back and hip.   PATIENT SURVEYS: Foto: will do next session  FOTO:  COGNITION: Overall cognitive status: Within functional limits for tasks assessed     SENSATION: WFL  POSTURE:    PALPATION:  hypomobile bil knee, L>R  knee (flexion) ;  Very tender R gr trochanter, mild tenderness into R glute, soreness in R SI, minimal pain in back.    LOWER EXTREMITY ROM:  Active ROM  Right eval Left eval  Hip flexion wfl wfl  Hip extension Mod limitation Mod limitation  Hip abduction Mild limitation Mild limitation  Hip adduction    Hip internal rotation    Hip external rotation Mild limitation   Knee flexion 105 85  Knee extension 0 -5  Ankle dorsiflexion    Ankle plantarflexion    Ankle inversion    Ankle eversion     (Blank rows = not tested)  LOWER EXTREMITY MMT:  MMT Right eval Left eval  Hip flexion 4- 4-  Hip extension    Hip abduction 4- (pain) 4-  Hip adduction    Hip internal rotation    Hip external rotation    Knee flexion 4 4  Knee extension 4 4  Ankle dorsiflexion    Ankle plantarflexion    Ankle inversion    Ankle eversion     (Blank rows = not tested)  LOWER EXTREMITY SPECIAL TESTS:  Neg faber, fadir,   + tenderness at gr troch  FUNCTIONAL TESTS:  Sit to stand: in efficient mechanics, not using/bending knees , likely due to stiffness, increased hinging from back/hips.   GAIT: Distance walked: 100 Assistive device utilized: None Level of assistance: Complete Independence Comments: unremarkable   TODAY'S TREATMENT:                                                                                                                              DATE:   11/01/22: Therapeutic Exercise: Aerobic: Supine: Clams GTB x 20;    SLR with TA, x 10 bil;   Seated: sit to stand x 10 with education on optimal mechanics , regular chair height, with light use of hands;  S/L hip abd 3 x 5 on R;  Standing:    Stretches:  Supine SKTC 30 sec x 3 bil; LTR x 10;  Neuromuscular Re-education: Manual Therapy: STM/TPR to R gr troch and glute Therapeutic Activity: Self Care:  10/21/22: Therapeutic Exercise: Aerobic: Supine: Seated: sit to stand 3 x 5 with education on optimal mechanics.  Standing: Stretches:  Supine SKTC 30 sec x 3 bil; R piriformis (modified ) x 2 min;  hip ER fallouts x 10;  Neuromuscular Re-education: Manual Therapy: Therapeutic Activity: Self Care:   PATIENT EDUCATION:  Education details: reviewed HEP Person educated: Patient Education method: Explanation, Demonstration, Tactile cues, Verbal cues, and Handouts Education comprehension: verbalized understanding, returned demonstration, verbal cues required, tactile cues required, and needs further education  HOME EXERCISE PROGRAM:  Access Code: [email or web address] URL: [email or web address] Date: 10/21/2022 Prepared by: Lyndee Hensen  Exercises - Hooklying Single Knee to Chest Stretch  - 2 x daily - 3 reps - 30 hold - Bent Knee Fallouts  - 2 x daily - 1 sets - 10 reps - 5 hold - Sit to Stand  - 1 x daily - 1 sets - 10 reps  ASSESSMENT:  CLINICAL IMPRESSION: 11/03/2022  Pt with much tenderness and soreness in R  hip, tolerated manual well, will benefit from continued manual as well as progressive strengthening for hip. Updated HEP today. Continued discussion on mechanics with sit to stand, as she is over using back due to knee stiffness. Much improved mechanics and ability from regular chair height after practice today.  Noted posture asymmetry in standing and with gait  today, with R hip higher. Likely leg length difference or from L knee stiffness/staying in slight flexion. Will continue to educate on posture awarenes.    OBJECTIVE IMPAIRMENTS: Abnormal gait,  decreased activity tolerance, decreased mobility, difficulty walking, decreased ROM, decreased strength, hypomobility, impaired flexibility, improper body mechanics, and pain.   ACTIVITY LIMITATIONS: bending, sitting, standing, squatting, sleeping, stairs, transfers, and locomotion level  PARTICIPATION LIMITATIONS: meal prep, cleaning, laundry, community activity, and yard work  PERSONAL FACTORS: Past/current experiences Knee stiffness from previous TKA, are also affecting patient's functional outcome.   REHAB POTENTIAL: Good  CLINICAL DECISION MAKING: Stable/uncomplicated  EVALUATION COMPLEXITY: Low   GOALS: Goals reviewed with patient? Yes  SHORT TERM GOALS: Target date: 11/04/2022  Pt to be independent with initial HEP  Goal status: INITIAL  2. Pt to demo ability for optimal mechanics for sit to stand from higher mat table.   Goal status: INITIAL    LONG TERM GOALS: Target date: 12/16/2022  Pt to be independent with final HEP for best care for knees and hip/back.   Goal status: INITIAL  2.  Pt to report decreased pain in R hip to 0-3/10 at night and with standing activity , to improve ability for community activity   Goal status: INITIAL  3.  Pt to demo ability for sit to stand with minor use of hands from regular chair height , to improve ability for IADLs and work duties .  Goal status: INITIAL  4.  Pt to demo improved strength of bil hip abd to at least 4+/5 to improve stability , gait and pain.    Goal status: INITIAL    PLAN:  PT FREQUENCY: 2x/week  PT DURATION: 8 weeks  PLANNED INTERVENTIONS: Therapeutic exercises, Therapeutic activity, Neuromuscular re-education, Balance training, Gait training, Patient/Family education, Self Care, Joint mobilization, Joint manipulation, DME instructions, Aquatic Therapy, Dry Needling, Electrical stimulation, Spinal manipulation, Spinal mobilization, Cryotherapy, Moist heat, Taping, Traction, Ultrasound, Ionotophoresis  108m/ml Dexamethasone, and Manual therapy  PLAN FOR NEXT SESSION:  LTR, Seated HSS,  knee flexion with strap, manual knee flexion/ hip extension,  hip abd strength. Manual for R hip gr troch and glue    LLyndee Hensen PT, DPT 12:18 PM  11/03/22

## 2022-11-04 ENCOUNTER — Other Ambulatory Visit: Payer: Self-pay | Admitting: Internal Medicine

## 2022-11-04 ENCOUNTER — Other Ambulatory Visit (HOSPITAL_COMMUNITY): Payer: Self-pay

## 2022-11-04 DIAGNOSIS — Z91041 Radiographic dye allergy status: Secondary | ICD-10-CM

## 2022-11-04 MED ORDER — PREDNISONE 50 MG PO TABS
50.0000 mg | ORAL_TABLET | Freq: Four times a day (QID) | ORAL | 0 refills | Status: AC
  Filled 2022-11-04: qty 3, 1d supply, fill #0

## 2022-11-04 NOTE — Progress Notes (Signed)
Sent contrast med

## 2022-11-04 NOTE — Telephone Encounter (Signed)
Patient states: - She is concerned about allergy to iodine and it's ability to cause anaphylactic shock   Patient requests confirmation on: 1. Can the test be done another way without the iodine? 2. If not, it is absolutely necessary 3. Will the medication combo of benadryl and prednisone really help prevent anaphylactic shock   She would also like the benadryl and prednisone sent to [facility] on [address]

## 2022-11-05 ENCOUNTER — Other Ambulatory Visit (HOSPITAL_COMMUNITY): Payer: Self-pay

## 2022-11-08 ENCOUNTER — Encounter: Payer: Self-pay | Admitting: Physical Therapy

## 2022-11-08 ENCOUNTER — Ambulatory Visit: Payer: Medicare HMO | Admitting: Physical Therapy

## 2022-11-08 DIAGNOSIS — M25551 Pain in right hip: Secondary | ICD-10-CM | POA: Diagnosis not present

## 2022-11-08 DIAGNOSIS — M25661 Stiffness of right knee, not elsewhere classified: Secondary | ICD-10-CM

## 2022-11-08 DIAGNOSIS — M25662 Stiffness of left knee, not elsewhere classified: Secondary | ICD-10-CM

## 2022-11-08 NOTE — Therapy (Signed)
OUTPATIENT PHYSICAL THERAPY LOWER EXTREMITY TREATMENT   Patient Name: [name] MRN: [id number] DOB:Jan 27, 1951, 72 y.o., female Today's Date: 11/08/2022  END OF SESSION:  PT End of Session - 11/08/22 1008     Visit Number 4    Number of Visits 16    Date for PT Re-Evaluation 12/16/22    Authorization Type Aetna Medicare    PT Start Time [phone]    PT Stop Time 1020    PT Time Calculation (min) 41 min    Activity Tolerance Patient tolerated treatment well    Behavior During Therapy [facility] for tasks assessed/performed               Past Medical History:  Diagnosis Date   Aortic atherosclerosis ([place]) 11/01/2022   On 2022 CT abdomen.   Arthritis    R knee, hands    Back pain    Blood transfusion    1986- post-partial hysterectomy   Breast cancer ([place]) 2009   right lumpectomy and radiation   Cataract    Chicken pox    Colon cancer ([place]) 2003   per pt report, cancer found in a colon polyp in 2003, procedure done in [place].  did not require surgery, chemo etc.     Depression    Difficulty standing 09/08/2022   Due to hip pain mainly- she agreed to physical therapy if insurance will cover   Diverticular disease 11/01/2022   On 2022 CT abd   Diverticulitis    Edema of both ankles    Family history of breast cancer 10/24/2017   Gallbladder problem    Gastritis    GERD (gastroesophageal reflux disease)    recently taken off anti reflux tx   Hepatitis    Hep. A- 1978   Hiatal hernia    History of stomach ulcers    Hypertension    Joint pain    Lactose intolerance    Lower extremity edema 01/11/2022   Lupus ([place])    Multiple gastric ulcers    Palpitations    Personal history of radiation therapy 2009   Prediabetes 09/08/2022   Lab Results Component Value Date  HGBA1C 5.7 (H) 06/29/2021     Rheumatic fever    Rheumatoid arthritis ([place])    SLE (systemic lupus erythematosus related syndrome) ([place])    Urinary incontinence    UTI (urinary tract infection)     Past Surgical History:  Procedure Laterality Date   [place] & tubal ligation    BIOPSY  07/25/2018   Procedure: BIOPSY;  Surgeon: Milus Banister, MD;  Location: [facility] ENDOSCOPY;  Service: Endoscopy;;   BREAST EXCISIONAL BIOPSY Left 1998   BREAST LUMPECTOMY Bilateral    2009   BREAST SURGERY     R breast lumpectomy   CHOLECYSTECTOMY     1978- open    ESOPHAGOGASTRODUODENOSCOPY (EGD) WITH PROPOFOL N/A 07/25/2018   Procedure: ESOPHAGOGASTRODUODENOSCOPY (EGD) WITH PROPOFOL;  Surgeon: Milus Banister, MD;  Location: [place];  Service: Endoscopy;  Laterality: N/A;   REPLACEMENT TOTAL KNEE Left    TONSILLECTOMY     1954   TOTAL KNEE ARTHROPLASTY  08/30/2011   Procedure: TOTAL KNEE ARTHROPLASTY;  Surgeon: Kerin Salen;  Location: [place];  Service: Orthopedics;  Laterality: Right;  Right Total Knee Arthroplasty   TUBAL LIGATION     Patient Active Problem List   Diagnosis Date Noted   Acute pain of  right shoulder 11/01/2022   Hand pain, left 11/01/2022   Hand pain, right 11/01/2022   Aortic atherosclerosis ([place]) 11/01/2022   Diverticular disease 11/01/2022   Upper abdominal pain 11/01/2022   Chronic idiopathic constipation 11/01/2022   DDD (degenerative disc disease), 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/2022   Primary localized osteoarthrosis of multiple sites 09/08/2022   Rheumatoid arthritis ([place]) 09/08/2022   GERD (gastroesophageal reflux disease) 09/08/2022   Prediabetes 09/08/2022   Difficulty standing 09/08/2022   Lower extremity edema 01/11/2022   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   Class 1 obesity due to excess calories with serious comorbidity and body mass index (BMI) of 32.0 to 32.9 in adult 02/17/2021   Psychophysiological insomnia 02/17/2021   Hyperlipidemia with target LDL less than 130 12/30/2020   LVH (left ventricular hypertrophy) due to hypertensive disease, without heart failure 12/30/2020   History of breast cancer  08/07/2018   Family history of coronary artery disease in father 08/07/2018   Family history of breast cancer 10/24/2017   Personal history of colon cancer 10/24/2017   Pain in joint 04/22/2009   Malignant neoplasm of upper-outer quadrant of right breast in female, estrogen receptor positive ([place]) 01/11/2008   Hiatal hernia 12/14/2007   DIVERTICULOSIS, COLON 12/14/2007   Essential hypertension 11/03/2007   Lupus ([place]) 11/03/2007   Flank pain 11/03/2007    PCP: Berniece Pap  REFERRING PROVIDER: Berniece Pap  REFERRING DIAG: Difficulty in walking   THERAPY DIAG:  Pain in right hip  Stiffness of left knee, not elsewhere classified  Stiffness of right knee, not elsewhere classified  Rationale for Evaluation and Treatment: Rehabilitation  ONSET DATE: 1 year ago.   SUBJECTIVE:   SUBJECTIVE STATEMENT:  11/08/2022 Pt states soreness in back/hip when sleeping on that side, but doing better overall  Eval:Pt reports R hip pain about 1 year, can't sleep on R side. Can sleep on L comfortably. Also pain with Standing for long time, walking. Better with movement.  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  She does not use No AD. Does not live alone.  [id number]: hands more, is on new meds- not sure if working yet.  Rash at times, fevers, 3x/yr flares.  She also reports increased pain below sternum in abdomen, when bending over cleaning. She has hiatal hernia but has not previously had any symptoms from it.    PERTINENT HISTORY: Lupus, RA, bil TKA,  Previous breast CA,colon  PAIN:  Are you having pain? Yes: NPRS scale: up to 7 /10 Pain location: R hip, R low back Pain description: same Aggravating factors: Sleeping on R side, Standing  Relieving factors: rest  PRECAUTIONS: None  WEIGHT BEARING RESTRICTIONS: No  FALLS:  Has patient fallen in last 6 months? Yes. Number of falls 1 - yesterday, tripped up step, fell into bush, did not  hurt herself.    PLOF: Independent  PATIENT GOALS: Less pain in hip/back.   NEXT MD VISIT:   OBJECTIVE:   DIAGNOSTIC FINDINGS: Recent x-ray for Back and hip.   PATIENT SURVEYS: Foto: will do next session  FOTO:  COGNITION: Overall cognitive status: Within functional limits for tasks assessed     SENSATION: WFL  POSTURE:    PALPATION:  hypomobile bil knee, L>R  knee (flexion) ;  Very tender R gr trochanter, mild tenderness into R glute, soreness in R SI, minimal pain in back.    LOWER EXTREMITY ROM:  Active ROM Right eval Left eval  Hip flexion wfl wfl  Hip extension Mod limitation Mod limitation  Hip abduction Mild limitation Mild limitation  Hip adduction    Hip internal rotation    Hip external rotation Mild limitation   Knee flexion 105 85  Knee extension 0 -5  Ankle dorsiflexion    Ankle plantarflexion    Ankle inversion    Ankle eversion     (Blank rows = not tested)  LOWER EXTREMITY MMT:  MMT Right eval Left eval  Hip flexion 4- 4-  Hip extension    Hip abduction 4- (pain) 4-  Hip adduction    Hip internal rotation    Hip external rotation    Knee flexion 4 4  Knee extension 4 4  Ankle dorsiflexion    Ankle plantarflexion    Ankle inversion    Ankle eversion     (Blank rows = not tested)  LOWER EXTREMITY SPECIAL TESTS:  Neg faber, fadir,   + tenderness at gr troch  FUNCTIONAL TESTS:  Sit to stand: in efficient mechanics, not using/bending knees , likely due to stiffness, increased hinging from back/hips.   GAIT: Distance walked: 100 Assistive device utilized: None Level of assistance: Complete Independence Comments: unremarkable   TODAY'S TREATMENT:                                                                                                                              DATE:   11/08/22: Therapeutic Exercise: Aerobic: Supine: Clams GTB x 20;  SLR with TA, x 15 bil;  Heel slides with strap x 15 bil;  Seated: sit to stand x 10  with education on optimal mechanics , regular chair height, with light use of hands;  S/L hip abd 1x10,then 1 x 5 on R;  Standing:   HR, Marching, hip abd, all 2 x 10 bil; with focus on core and trunk stability and posture; Step ups 4 in x10 bil 1 UE support;  Side steps 25 ft x 4;  Stretches:  LTR x 10;  Neuromuscular Re-education: Manual Therapy:  Therapeutic Activity: Self Care:  11/01/22: Therapeutic Exercise: Aerobic: Supine: Clams GTB x 20;  SLR with TA, x 10 bil;  Heel slides with strap x 15 bil;  Seated: sit to stand x 10 with education on optimal mechanics , regular chair height, with light use of hands;  S/L hip abd 3 x 5 on R;  Standing:    Stretches:  Supine SKTC 30 sec x 3 bil; LTR x 10;  Neuromuscular Re-education: Manual Therapy: STM/TPR to R gr troch and glute Therapeutic Activity: Self Care:  10/21/22: Therapeutic Exercise: Aerobic: Supine: Seated: sit to stand 3 x 5 with education on optimal mechanics.  Standing: Stretches:  Supine SKTC 30 sec x 3 bil; R piriformis (modified ) x 2 min;  hip ER fallouts x 10;  Neuromuscular Re-education: Manual Therapy: Therapeutic Activity: Self Care:   PATIENT EDUCATION:  Education details: reviewed HEP Person educated: Patient Education method: Consulting civil engineer, Demonstration,  Tactile cues, Verbal cues, and Handouts Education comprehension: verbalized understanding, returned demonstration, verbal cues required, tactile cues required, and needs further education  HOME EXERCISE PROGRAM:  Access Code: [id number]   ASSESSMENT:  CLINICAL IMPRESSION: 11/08/2022  Pt with good ability for progressive ther ex today, reviewed standing LE strength with focus on posture and core stability. Reviewed exercises to do in pool as well, as pt is going to start doing to the [facility]. Plan to try bike and practice stairs next visit.   OBJECTIVE IMPAIRMENTS: Abnormal gait, decreased activity tolerance, decreased mobility, difficulty walking,  decreased ROM, decreased strength, hypomobility, impaired flexibility, improper body mechanics, and pain.   ACTIVITY LIMITATIONS: bending, sitting, standing, squatting, sleeping, stairs, transfers, and locomotion level  PARTICIPATION LIMITATIONS: meal prep, cleaning, laundry, community activity, and yard work  PERSONAL FACTORS: Past/current experiences Knee stiffness from previous TKA, are also affecting patient's functional outcome.   REHAB POTENTIAL: Good  CLINICAL DECISION MAKING: Stable/uncomplicated  EVALUATION COMPLEXITY: Low   GOALS: Goals reviewed with patient? Yes  SHORT TERM GOALS: Target date: 11/04/2022  Pt to be independent with initial HEP  Goal status: INITIAL  2. Pt to demo ability for optimal mechanics for sit to stand from higher mat table.   Goal status: INITIAL    LONG TERM GOALS: Target date: 12/16/2022  Pt to be independent with final HEP for best care for knees and hip/back.   Goal status: INITIAL  2.  Pt to report decreased pain in R hip to 0-3/10 at night and with standing activity , to improve ability for community activity   Goal status: INITIAL  3.  Pt to demo ability for sit to stand with minor use of hands from regular chair height , to improve ability for IADLs and work duties .  Goal status: INITIAL  4.  Pt to demo improved strength of bil hip abd to at least 4+/5 to improve stability , gait and pain.    Goal status: INITIAL    PLAN:  PT FREQUENCY: 2x/week  PT DURATION: 8 weeks  PLANNED INTERVENTIONS: Therapeutic exercises, Therapeutic activity, Neuromuscular re-education, Balance training, Gait training, Patient/Family education, Self Care, Joint mobilization, Joint manipulation, DME instructions, Aquatic Therapy, Dry Needling, Electrical stimulation, Spinal manipulation, Spinal mobilization, Cryotherapy, Moist heat, Taping, Traction, Ultrasound, Ionotophoresis 64m/ml Dexamethasone, and Manual therapy  PLAN FOR NEXT SESSION:   stairs, try bike    LLyndee Hensen PT, DPT 10:37 AM  11/08/22

## 2022-11-10 ENCOUNTER — Other Ambulatory Visit: Payer: Self-pay

## 2022-11-10 ENCOUNTER — Ambulatory Visit: Payer: Medicare HMO | Admitting: Physical Therapy

## 2022-11-10 ENCOUNTER — Encounter: Payer: Self-pay | Admitting: Physical Therapy

## 2022-11-10 DIAGNOSIS — M25551 Pain in right hip: Secondary | ICD-10-CM

## 2022-11-10 DIAGNOSIS — Z91041 Radiographic dye allergy status: Secondary | ICD-10-CM

## 2022-11-10 DIAGNOSIS — M25661 Stiffness of right knee, not elsewhere classified: Secondary | ICD-10-CM | POA: Diagnosis not present

## 2022-11-10 DIAGNOSIS — M25662 Stiffness of left knee, not elsewhere classified: Secondary | ICD-10-CM | POA: Diagnosis not present

## 2022-11-10 DIAGNOSIS — M5459 Other low back pain: Secondary | ICD-10-CM

## 2022-11-10 MED ORDER — DIPHENHYDRAMINE HCL 50 MG PO CAPS: ORAL_CAPSULE | ORAL | 0 refills | Status: DC

## 2022-11-10 NOTE — Therapy (Signed)
OUTPATIENT PHYSICAL THERAPY LOWER EXTREMITY TREATMENT   Patient Name: [name] MRN: [id number] DOB:10/10/1950, 72 y.o., female Today's Date: 11/10/2022  END OF SESSION:  PT End of Session - 11/10/22 1830     Visit Number 5    Number of Visits 16    Date for PT Re-Evaluation 12/16/22    Authorization Type Aetna Medicare    PT Start Time 0935    PT Stop Time 1015    PT Time Calculation (min) 40 min    Activity Tolerance Patient tolerated treatment well    Behavior During Therapy [facility] for tasks assessed/performed                Past Medical History:  Diagnosis Date   Aortic atherosclerosis ([place]) 11/01/2022   On 2022 CT abdomen.   Arthritis    R knee, hands    Back pain    Blood transfusion    1986- post-partial hysterectomy   Breast cancer ([place]) 2009   right lumpectomy and radiation   Cataract    Chicken pox    Colon cancer ([place]) 2003   per pt report, cancer found in a colon polyp in 2003, procedure done in [place].  did not require surgery, chemo etc.     Depression    Difficulty standing 09/08/2022   Due to hip pain mainly- she agreed to physical therapy if insurance will cover   Diverticular disease 11/01/2022   On 2022 CT abd   Diverticulitis    Edema of both ankles    Family history of breast cancer 10/24/2017   Gallbladder problem    Gastritis    GERD (gastroesophageal reflux disease)    recently taken off anti reflux tx   Hepatitis    Hep. A- 1978   Hiatal hernia    History of stomach ulcers    Hypertension    Joint pain    Lactose intolerance    Lower extremity edema 01/11/2022   Lupus ([place])    Multiple gastric ulcers    Palpitations    Personal history of radiation therapy 2009   Prediabetes 09/08/2022   Lab Results Component Value Date  HGBA1C 5.7 (H) 06/29/2021     Rheumatic fever    Rheumatoid arthritis ([place])    SLE (systemic lupus erythematosus related syndrome) ([place])    Urinary incontinence    UTI (urinary tract infection)     Past Surgical History:  Procedure Laterality Date   [place] & tubal ligation    BIOPSY  07/25/2018   Procedure: BIOPSY;  Surgeon: Milus Banister, MD;  Location: [facility] ENDOSCOPY;  Service: Endoscopy;;   BREAST EXCISIONAL BIOPSY Left 1998   BREAST LUMPECTOMY Bilateral    2009   BREAST SURGERY     R breast lumpectomy   CHOLECYSTECTOMY     1978- open    ESOPHAGOGASTRODUODENOSCOPY (EGD) WITH PROPOFOL N/A 07/25/2018   Procedure: ESOPHAGOGASTRODUODENOSCOPY (EGD) WITH PROPOFOL;  Surgeon: Milus Banister, MD;  Location: [place];  Service: Endoscopy;  Laterality: N/A;   REPLACEMENT TOTAL KNEE Left    TONSILLECTOMY     1954   TOTAL KNEE ARTHROPLASTY  08/30/2011   Procedure: TOTAL KNEE ARTHROPLASTY;  Surgeon: Kerin Salen;  Location: [place];  Service: Orthopedics;  Laterality: Right;  Right Total Knee Arthroplasty   TUBAL LIGATION     Patient Active Problem List   Diagnosis Date Noted   Acute pain  of right shoulder 11/01/2022   Hand pain, left 11/01/2022   Hand pain, right 11/01/2022   Aortic atherosclerosis ([place]) 11/01/2022   Diverticular disease 11/01/2022   Upper abdominal pain 11/01/2022   Chronic idiopathic constipation 11/01/2022   DDD (degenerative disc disease), 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/2022   Primary localized osteoarthrosis of multiple sites 09/08/2022   Rheumatoid arthritis ([place]) 09/08/2022   GERD (gastroesophageal reflux disease) 09/08/2022   Prediabetes 09/08/2022   Difficulty standing 09/08/2022   Lower extremity edema 01/11/2022   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   Class 1 obesity due to excess calories with serious comorbidity and body mass index (BMI) of 32.0 to 32.9 in adult 02/17/2021   Psychophysiological insomnia 02/17/2021   Hyperlipidemia with target LDL less than 130 12/30/2020   LVH (left ventricular hypertrophy) due to hypertensive disease, without heart failure 12/30/2020   History of breast cancer  08/07/2018   Family history of coronary artery disease in father 08/07/2018   Family history of breast cancer 10/24/2017   Personal history of colon cancer 10/24/2017   Pain in joint 04/22/2009   Malignant neoplasm of upper-outer quadrant of right breast in female, estrogen receptor positive ([place]) 01/11/2008   Hiatal hernia 12/14/2007   DIVERTICULOSIS, COLON 12/14/2007   Essential hypertension 11/03/2007   Lupus ([place]) 11/03/2007   Flank pain 11/03/2007    PCP: Berniece Pap  REFERRING PROVIDER: Berniece Pap  REFERRING DIAG: Difficulty in walking   THERAPY DIAG:  Pain in right hip  Stiffness of left knee, not elsewhere classified  Stiffness of right knee, not elsewhere classified  Other low back pain  Rationale for Evaluation and Treatment: Rehabilitation  ONSET DATE: 1 year ago.   SUBJECTIVE:   SUBJECTIVE STATEMENT:  11/10/2022 Pt states soreness in hip only when laying on it.   Eval:Pt reports R hip pain about 1 year, can't sleep on R side. Can sleep on L comfortably. Also pain with Standing for long time, walking. Better with movement.  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  She does not use No AD. Does not live alone.  [id number]: hands more, is on new meds- not sure if working yet.  Rash at times, fevers, 3x/yr flares.  She also reports increased pain below sternum in abdomen, when bending over cleaning. She has hiatal hernia but has not previously had any symptoms from it.    PERTINENT HISTORY: Lupus, RA, bil TKA,  Previous breast CA,colon  PAIN:  Are you having pain? Yes: NPRS scale: up to 7 /10 Pain location: R hip, R low back Pain description: same Aggravating factors: Sleeping on R side, Standing  Relieving factors: rest  PRECAUTIONS: None  WEIGHT BEARING RESTRICTIONS: No  FALLS:  Has patient fallen in last 6 months? Yes. Number of falls 1 - yesterday, tripped up step, fell into bush, did not hurt  herself.    PLOF: Independent  PATIENT GOALS: Less pain in hip/back.   NEXT MD VISIT:   OBJECTIVE:   DIAGNOSTIC FINDINGS: Recent x-ray for Back and hip.   PATIENT SURVEYS: Foto: will do next session  FOTO:  COGNITION: Overall cognitive status: Within functional limits for tasks assessed     SENSATION: WFL  POSTURE:    PALPATION:  hypomobile bil knee, L>R  knee (flexion) ;  Very tender R gr trochanter, mild tenderness into R glute, soreness in R SI, minimal pain in back.    LOWER EXTREMITY ROM:  Active ROM Right eval Left eval  Hip flexion  wfl wfl  Hip extension Mod limitation Mod limitation  Hip abduction Mild limitation Mild limitation  Hip adduction    Hip internal rotation    Hip external rotation Mild limitation   Knee flexion 105 85  Knee extension 0 -5  Ankle dorsiflexion    Ankle plantarflexion    Ankle inversion    Ankle eversion     (Blank rows = not tested)  LOWER EXTREMITY MMT:  MMT Right eval Left eval  Hip flexion 4- 4-  Hip extension    Hip abduction 4- (pain) 4-  Hip adduction    Hip internal rotation    Hip external rotation    Knee flexion 4 4  Knee extension 4 4  Ankle dorsiflexion    Ankle plantarflexion    Ankle inversion    Ankle eversion     (Blank rows = not tested)  LOWER EXTREMITY SPECIAL TESTS:  Neg faber, fadir,   + tenderness at gr troch  FUNCTIONAL TESTS:  Sit to stand: in efficient mechanics, not using/bending knees , likely due to stiffness, increased hinging from back/hips.   GAIT: Distance walked: 100 Assistive device utilized: None Level of assistance: Complete Independence Comments: unremarkable   TODAY'S TREATMENT:                                                                                                                              DATE:   11/10/22: Therapeutic Exercise: Aerobic: Supine: Clams GTB x 20;  SLR with TA, x 15 bil;   Seated: sit to stand x 10 with education on optimal mechanics ,  higher mat table, no hands  S/L hip abd 2x10 on R:  Standing:   HR, Marching, hip abd, all 2 x 10 bil; with focus on core and trunk stability and posture;  Stretches:   Neuromuscular Re-education: Manual Therapy: STM/tennis ball to R hip/ glute med Therapeutic Activity: education and practice on stairs, 4 in and 6 in , ascending and descending. Pt with step to pattern, leading with L, for descending 6 in steps. 1 UE/ Hand rail use.  Self Care:   11/08/22: Therapeutic Exercise: Aerobic: Supine: Clams GTB x 20;  SLR with TA, x 15 bil;  Heel slides with strap x 15 bil;  Seated: sit to stand x 10 with education on optimal mechanics , regular chair height, with light use of hands;  S/L hip abd 1x10,then 1 x 5 on R;  Standing:   HR, Marching, hip abd, all 2 x 10 bil; with focus on core and trunk stability and posture; Step ups 4 in x10 bil 1 UE support;  Side steps 25 ft x 4;  Stretches:  LTR x 10;  Neuromuscular Re-education: Manual Therapy:  Therapeutic Activity: Self Care:  11/01/22: Therapeutic Exercise: Aerobic: Supine: Clams GTB x 20;  SLR with TA, x 10 bil;  Heel slides with strap x 15 bil;  Seated: sit to stand x 10 with education  on optimal mechanics , regular chair height, with light use of hands;  S/L hip abd 3 x 5 on R;  Standing:    Stretches:  Supine SKTC 30 sec x 3 bil; LTR x 10;  Neuromuscular Re-education: Manual Therapy: STM/TPR to R gr troch and glute Therapeutic Activity: Self Care:  10/21/22: Therapeutic Exercise: Aerobic: Supine: Seated: sit to stand 3 x 5 with education on optimal mechanics.  Standing: Stretches:  Supine SKTC 30 sec x 3 bil; R piriformis (modified ) x 2 min;  hip ER fallouts x 10;  Neuromuscular Re-education: Manual Therapy: Therapeutic Activity: Self Care:   PATIENT EDUCATION:  Education details: reviewed HEP Person educated: Patient Education method: Explanation, Demonstration, Tactile cues, Verbal cues, and Handouts Education  comprehension: verbalized understanding, returned demonstration, verbal cues required, tactile cues required, and needs further education  HOME EXERCISE PROGRAM:  Access Code: [id number]   ASSESSMENT:  CLINICAL IMPRESSION: 11/10/2022  Pt with soreness with palpation and STM at [place] med today. Only slight pain with exercises- hip abd only. Education and practice for stair navigation today, pt comfortable with step to pattern for descending 6 in step due to limited knee flexion on R. Unable to achieve full revolution on bike today with trail, also due to limited knee flexion.   OBJECTIVE IMPAIRMENTS: Abnormal gait, decreased activity tolerance, decreased mobility, difficulty walking, decreased ROM, decreased strength, hypomobility, impaired flexibility, improper body mechanics, and pain.   ACTIVITY LIMITATIONS: bending, sitting, standing, squatting, sleeping, stairs, transfers, and locomotion level  PARTICIPATION LIMITATIONS: meal prep, cleaning, laundry, community activity, and yard work  PERSONAL FACTORS: Past/current experiences Knee stiffness from previous TKA, are also affecting patient's functional outcome.   REHAB POTENTIAL: Good  CLINICAL DECISION MAKING: Stable/uncomplicated  EVALUATION COMPLEXITY: Low   GOALS: Goals reviewed with patient? Yes  SHORT TERM GOALS: Target date: 11/04/2022  Pt to be independent with initial HEP  Goal status: INITIAL  2. Pt to demo ability for optimal mechanics for sit to stand from higher mat table.   Goal status: INITIAL    LONG TERM GOALS: Target date: 12/16/2022  Pt to be independent with final HEP for best care for knees and hip/back.   Goal status: INITIAL  2.  Pt to report decreased pain in R hip to 0-3/10 at night and with standing activity , to improve ability for community activity   Goal status: INITIAL  3.  Pt to demo ability for sit to stand with minor use of hands from regular chair height , to improve ability for  IADLs and work duties .  Goal status: INITIAL  4.  Pt to demo improved strength of bil hip abd to at least 4+/5 to improve stability , gait and pain.    Goal status: INITIAL    PLAN:  PT FREQUENCY: 2x/week  PT DURATION: 8 weeks  PLANNED INTERVENTIONS: Therapeutic exercises, Therapeutic activity, Neuromuscular re-education, Balance training, Gait training, Patient/Family education, Self Care, Joint mobilization, Joint manipulation, DME instructions, Aquatic Therapy, Dry Needling, Electrical stimulation, Spinal manipulation, Spinal mobilization, Cryotherapy, Moist heat, Taping, Traction, Ultrasound, Ionotophoresis 59m/ml Dexamethasone, and Manual therapy  PLAN FOR NEXT SESSION:  stairs, try bike    LLyndee Hensen PT, DPT 6:33 PM  11/10/22

## 2022-11-11 NOTE — Telephone Encounter (Signed)
Left vm to call the office back concerning this. Sent my chart message with Dr.Morrison's response.

## 2022-11-12 MED ORDER — DIPHENHYDRAMINE HCL 50 MG PO CAPS: ORAL_CAPSULE | ORAL | 0 refills | Status: DC

## 2022-11-12 NOTE — Addendum Note (Signed)
Addended by: Loralee Pacas on: 11/12/2022 04:14 PM   Modules accepted: Orders

## 2022-11-12 NOTE — Addendum Note (Signed)
Addended by: Loralee Pacas on: 11/12/2022 04:12 PM   Modules accepted: Orders

## 2022-11-15 ENCOUNTER — Other Ambulatory Visit: Payer: Self-pay | Admitting: Internal Medicine

## 2022-11-15 ENCOUNTER — Encounter: Payer: Self-pay | Admitting: Physical Therapy

## 2022-11-15 ENCOUNTER — Ambulatory Visit: Payer: Medicare HMO | Admitting: Physical Therapy

## 2022-11-15 DIAGNOSIS — R100 Acute abdomen: Secondary | ICD-10-CM | POA: Diagnosis not present

## 2022-11-15 DIAGNOSIS — M25662 Stiffness of left knee, not elsewhere classified: Secondary | ICD-10-CM

## 2022-11-15 DIAGNOSIS — M25661 Stiffness of right knee, not elsewhere classified: Secondary | ICD-10-CM

## 2022-11-15 DIAGNOSIS — M5459 Other low back pain: Secondary | ICD-10-CM

## 2022-11-15 DIAGNOSIS — M25551 Pain in right hip: Secondary | ICD-10-CM | POA: Diagnosis not present

## 2022-11-15 DIAGNOSIS — M329 Systemic lupus erythematosus, unspecified: Secondary | ICD-10-CM | POA: Diagnosis not present

## 2022-11-15 NOTE — Therapy (Signed)
OUTPATIENT PHYSICAL THERAPY LOWER EXTREMITY TREATMENT   Patient Name: [name] MRN: [id number] DOB:[birth year], 72 y.o., female Today's Date: 11/15/2022  END OF SESSION:  PT End of Session - 11/15/22 0937     Visit Number 6    Number of Visits 16    Date for PT Re-Evaluation 12/16/22    Authorization Type [employer]na Medicare    PT Start Time [phone]    PT Stop Time 1015    PT Time Calculation (min) 39 min    Activity Tolerance Patient tolerated treatment well    Behavior During Therapy [facility] for tasks assessed/performed                Past Medical History:  Diagnosis Date   Aortic atherosclerosis ([place]) 11/01/2022   On 2022 CT abdomen.   Arthritis    R knee, hands    Back pain    Blood transfusion    1986- post-partial hysterectomy   Breast cancer ([place]) 2009   right lumpectomy and radiation   Cataract    Chicken pox    Colon cancer ([place]) 2003   per pt report, cancer found in a colon polyp in 2003, procedure done in [place].  did not require surgery, chemo etc.     Depression    Difficulty standing 09/08/2022   Due to hip pain mainly- she agreed to physical therapy if insurance will cover   Diverticular disease 11/01/2022   On 2022 CT abd   Diverticulitis    Edema of both ankles    Family history of breast cancer 10/24/2017   Gallbladder problem    Gastritis    GERD (gastroesophageal reflux disease)    recently taken off anti reflux tx   Hepatitis    Hep. A- 1978   Hiatal hernia    History of stomach ulcers    Hypertension    Joint pain    Lactose intolerance    Lower extremity edema 01/11/2022   Lupus ([place])    Multiple gastric ulcers    Palpitations    Personal history of radiation therapy 2009   Prediabetes 09/08/2022   Lab Results Component Value Date  HGBA1C 5.7 (H) 06/29/2021     Rheumatic fever    Rheumatoid arthritis ([place])    SLE (systemic lupus erythematosus related syndrome) ([place])    Urinary incontinence    UTI (urinary tract infection)     Past Surgical History:  Procedure Laterality Date   [place] & tubal ligation    BIOPSY  07/25/2018   Procedure: BIOPSY;  Surgeon: Milus Banister, MD;  Location: [facility] ENDOSCOPY;  Service: Endoscopy;;   BREAST EXCISIONAL BIOPSY Left 1998   BREAST LUMPECTOMY Bilateral    2009   BREAST SURGERY     R breast lumpectomy   CHOLECYSTECTOMY     1978- open    ESOPHAGOGASTRODUODENOSCOPY (EGD) WITH PROPOFOL N/A 07/25/2018   Procedure: ESOPHAGOGASTRODUODENOSCOPY (EGD) WITH PROPOFOL;  Surgeon: Milus Banister, MD;  Location: [place];  Service: Endoscopy;  Laterality: N/A;   REPLACEMENT TOTAL KNEE Left    TONSILLECTOMY     1954   TOTAL KNEE ARTHROPLASTY  08/30/2011   Procedure: TOTAL KNEE ARTHROPLASTY;  Surgeon: Kerin Salen;  Location: [place];  Service: Orthopedics;  Laterality: Right;  Right Total Knee Arthroplasty   TUBAL LIGATION     Patient Active Problem List   Diagnosis Date Noted   Acute pain  of right shoulder 11/01/2022   Hand pain, left 11/01/2022   Hand pain, right 11/01/2022   Aortic atherosclerosis ([place]) 11/01/2022   Diverticular disease 11/01/2022   Upper abdominal pain 11/01/2022   Chronic idiopathic constipation 11/01/2022   DDD (degenerative disc disease), 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/2022   Primary localized osteoarthrosis of multiple sites 09/08/2022   Rheumatoid arthritis ([place]) 09/08/2022   GERD (gastroesophageal reflux disease) 09/08/2022   Prediabetes 09/08/2022   Difficulty standing 09/08/2022   Lower extremity edema 01/11/2022   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   Class 1 obesity due to excess calories with serious comorbidity and body mass index (BMI) of 32.0 to 32.9 in adult 02/17/2021   Psychophysiological insomnia 02/17/2021   Hyperlipidemia with target LDL less than 130 12/30/2020   LVH (left ventricular hypertrophy) due to hypertensive disease, without heart failure 12/30/2020   History of breast cancer  08/07/2018   Family history of coronary artery disease in father 08/07/2018   Family history of breast cancer 10/24/2017   Personal history of colon cancer 10/24/2017   Pain in joint 04/22/2009   Malignant neoplasm of upper-outer quadrant of right breast in female, estrogen receptor positive ([place]) 01/11/2008   Hiatal hernia 12/14/2007   DIVERTICULOSIS, COLON 12/14/2007   Essential hypertension 11/03/2007   Lupus ([place]) 11/03/2007   Flank pain 11/03/2007    PCP: Berniece Pap  REFERRING PROVIDER: Berniece Pap  REFERRING DIAG: Difficulty in walking   THERAPY DIAG:  Pain in right hip  Stiffness of left knee, not elsewhere classified  Stiffness of right knee, not elsewhere classified  Other low back pain  Rationale for Evaluation and Treatment: Rehabilitation  ONSET DATE: 1 year ago.   SUBJECTIVE:   SUBJECTIVE STATEMENT:  11/15/2022 Pt states soreness in hip when laying on it and when standing too long   Eval:Pt reports R hip pain about 1 year, can't sleep on R side. Can sleep on L comfortably. Also pain with Standing for long time, walking. Better with movement.  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  She does not use No AD. Does not live alone.  [id number]: hands more, is on new meds- not sure if working yet.  Rash at times, fevers, 3x/yr flares.  She also reports increased pain below sternum in abdomen, when bending over cleaning. She has hiatal hernia but has not previously had any symptoms from it.    PERTINENT HISTORY: Lupus, RA, bil TKA,  Previous breast CA,colon  PAIN:  Are you having pain? Yes: NPRS scale: up to 7 /10 Pain location: R hip, R low back Pain description: same Aggravating factors: Sleeping on R side, Standing  Relieving factors: rest  PRECAUTIONS: None  WEIGHT BEARING RESTRICTIONS: No  FALLS:  Has patient fallen in last 6 months? Yes. Number of falls 1 - yesterday, tripped up step, fell into bush,  did not hurt herself.    PLOF: Independent  PATIENT GOALS: Less pain in hip/back.   NEXT MD VISIT:   OBJECTIVE:   DIAGNOSTIC FINDINGS: Recent x-ray for Back and hip.   PATIENT SURVEYS: Foto: will do next session  FOTO:  COGNITION: Overall cognitive status: Within functional limits for tasks assessed     SENSATION: WFL  POSTURE:    PALPATION:  hypomobile bil knee, L>R  knee (flexion) ;  Very tender R gr trochanter, mild tenderness into R glute, soreness in R SI, minimal pain in back.    LOWER EXTREMITY ROM:  Active ROM Right eval Left  eval  Hip flexion wfl wfl  Hip extension Mod limitation Mod limitation  Hip abduction Mild limitation Mild limitation  Hip adduction    Hip internal rotation    Hip external rotation Mild limitation   Knee flexion 105 85  Knee extension 0 -5  Ankle dorsiflexion    Ankle plantarflexion    Ankle inversion    Ankle eversion     (Blank rows = not tested)  LOWER EXTREMITY MMT:  MMT Right eval Left eval  Hip flexion 4- 4-  Hip extension    Hip abduction 4- (pain) 4-  Hip adduction    Hip internal rotation    Hip external rotation    Knee flexion 4 4  Knee extension 4 4  Ankle dorsiflexion    Ankle plantarflexion    Ankle inversion    Ankle eversion     (Blank rows = not tested)  LOWER EXTREMITY SPECIAL TESTS:  Neg faber, fadir,   + tenderness at gr troch  FUNCTIONAL TESTS:  Sit to stand: in efficient mechanics, not using/bending knees , likely due to stiffness, increased hinging from back/hips.   GAIT: Distance walked: 100 Assistive device utilized: None Level of assistance: Complete Independence Comments: unremarkable   TODAY'S TREATMENT:                                                                                                                              DATE:   11/15/22: Therapeutic Exercise: Aerobic: Supine: Clams GTB x 20;  SLR with TA, x 10 bil;  Review of Heel slides with strap- added to HEP.   Seated: sit to stand x 10  higher mat table, no hands  S/L hip abd 2 x 10 on R:  Standing:   Marching, hip abd, 2 x 10 bil; Hip ext on R x 10;  all with focus on core and trunk stability and posture; Step ups fwd 6 in, x 10 ea bil; 1 UE support;   Stretches:   Neuromuscular Re-education: Manual Therapy:  Therapeutic Activity:  Self Care:  11/10/22: Therapeutic Exercise: Aerobic: Supine: Clams GTB x 20;  SLR with TA, x 15 bil;   Seated: sit to stand x 10 with education on optimal mechanics , higher mat table, no hands  S/L hip abd 2x10 on R:  Standing:   HR, Marching, hip abd, all 2 x 10 bil; with focus on core and trunk stability and posture;  Stretches:   Neuromuscular Re-education: Manual Therapy: STM/tennis ball to R hip/ glute med Therapeutic Activity: education and practice on stairs, 4 in and 6 in , ascending and descending. Pt with step to pattern, leading with L, for descending 6 in steps. 1 UE/ Hand rail use.  Self Care:   11/08/22: Therapeutic Exercise: Aerobic: Supine: Clams GTB x 20;  SLR with TA, x 15 bil;  Heel slides with strap x 15 bil;  Seated: sit to stand x 10 with education on optimal  mechanics , regular chair height, with light use of hands;  S/L hip abd 1x10,then 1 x 5 on R;  Standing:   HR, Marching, hip abd, all 2 x 10 bil; with focus on core and trunk stability and posture; Step ups 4 in x10 bil 1 UE support;  Side steps 25 ft x 4;  Stretches:  LTR x 10;  Neuromuscular Re-education: Manual Therapy:  Therapeutic Activity: Self Care:    PATIENT EDUCATION:  Education details: reviewed HEP Person educated: Patient Education method: Explanation, Demonstration, Tactile cues, Verbal cues, and Handouts Education comprehension: verbalized understanding, returned demonstration, verbal cues required, tactile cues required, and needs further education  HOME EXERCISE PROGRAM: Access Code: [id number]   ASSESSMENT:  CLINICAL IMPRESSION: 11/15/2022  Continued  strengthening for LEs and hip today, Pt with mild soreness with hip abd. Reviewed optimal form for s/l abd, and importance of doing for HEP. Pt will benefit from continued strengthening for hip, for improving pain with functional activity. She has difficulty in standing with trunk and hip stability with standing exercises.   OBJECTIVE IMPAIRMENTS: Abnormal gait, decreased activity tolerance, decreased mobility, difficulty walking, decreased ROM, decreased strength, hypomobility, impaired flexibility, improper body mechanics, and pain.   ACTIVITY LIMITATIONS: bending, sitting, standing, squatting, sleeping, stairs, transfers, and locomotion level  PARTICIPATION LIMITATIONS: meal prep, cleaning, laundry, community activity, and yard work  PERSONAL FACTORS: Past/current experiences Knee stiffness from previous TKA, are also affecting patient's functional outcome.   REHAB POTENTIAL: Good  CLINICAL DECISION MAKING: Stable/uncomplicated  EVALUATION COMPLEXITY: Low   GOALS: Goals reviewed with patient? Yes  SHORT TERM GOALS: Target date: 11/04/2022  Pt to be independent with initial HEP  Goal status: INITIAL  2. Pt to demo ability for optimal mechanics for sit to stand from higher mat table.   Goal status: INITIAL    LONG TERM GOALS: Target date: 12/16/2022  Pt to be independent with final HEP for best care for knees and hip/back.   Goal status: INITIAL  2.  Pt to report decreased pain in R hip to 0-3/10 at night and with standing activity , to improve ability for community activity   Goal status: INITIAL  3.  Pt to demo ability for sit to stand with minor use of hands from regular chair height , to improve ability for IADLs and work duties .  Goal status: INITIAL  4.  Pt to demo improved strength of bil hip abd to at least 4+/5 to improve stability , gait and pain.    Goal status: INITIAL    PLAN:  PT FREQUENCY: 2x/week  PT DURATION: 8 weeks  PLANNED INTERVENTIONS:  Therapeutic exercises, Therapeutic activity, Neuromuscular re-education, Balance training, Gait training, Patient/Family education, Self Care, Joint mobilization, Joint manipulation, DME instructions, Aquatic Therapy, Dry Needling, Electrical stimulation, Spinal manipulation, Spinal mobilization, Cryotherapy, Moist heat, Taping, Traction, Ultrasound, Ionotophoresis '4mg'$ /ml Dexamethasone, and Manual therapy  PLAN FOR NEXT SESSION:  stairs, try bike    Lyndee Hensen, PT, DPT 2:18 PM  11/15/22

## 2022-11-16 LAB — LIPASE: Lipase: 30 U/L (ref 14–85)

## 2022-11-16 LAB — LAB REPORT - SCANNED: EGFR: 77

## 2022-11-16 LAB — AMYLASE: Amylase: 125 U/L — ABNORMAL HIGH (ref 31–110)

## 2022-11-17 ENCOUNTER — Ambulatory Visit: Payer: Medicare HMO | Admitting: Physical Therapy

## 2022-11-17 ENCOUNTER — Encounter: Payer: Self-pay | Admitting: Physical Therapy

## 2022-11-17 DIAGNOSIS — M5459 Other low back pain: Secondary | ICD-10-CM

## 2022-11-17 DIAGNOSIS — M25661 Stiffness of right knee, not elsewhere classified: Secondary | ICD-10-CM

## 2022-11-17 DIAGNOSIS — M25551 Pain in right hip: Secondary | ICD-10-CM

## 2022-11-17 DIAGNOSIS — M25662 Stiffness of left knee, not elsewhere classified: Secondary | ICD-10-CM | POA: Diagnosis not present

## 2022-11-17 NOTE — Therapy (Signed)
OUTPATIENT PHYSICAL THERAPY LOWER EXTREMITY TREATMENT   Patient Name: [name] MRN: [id number] DOB:09/20/1951, 72 y.o., female Today's Date: 11/17/2022  END OF SESSION:  PT End of Session - 11/17/22 0932     Visit Number 7    Number of Visits 16    Date for PT Re-Evaluation 12/16/22    Authorization Type [employer]na Medicare    PT Start Time [phone]    PT Stop Time 1015    PT Time Calculation (min) 42 min    Activity Tolerance Patient tolerated treatment well    Behavior During Therapy [facility] for tasks assessed/performed                Past Medical History:  Diagnosis Date   Aortic atherosclerosis ([place]) 11/01/2022   On 2022 CT abdomen.   Arthritis    R knee, hands    Back pain    Blood transfusion    1986- post-partial hysterectomy   Breast cancer ([place]) 2009   right lumpectomy and radiation   Cataract    Chicken pox    Colon cancer ([place]) 2003   per pt report, cancer found in a colon polyp in 2003, procedure done in [place].  did not require surgery, chemo etc.     Depression    Difficulty standing 09/08/2022   Due to hip pain mainly- she agreed to physical therapy if insurance will cover   Diverticular disease 11/01/2022   On 2022 CT abd   Diverticulitis    Edema of both ankles    Family history of breast cancer 10/24/2017   Gallbladder problem    Gastritis    GERD (gastroesophageal reflux disease)    recently taken off anti reflux tx   Hepatitis    Hep. A- 1978   Hiatal hernia    History of stomach ulcers    Hypertension    Joint pain    Lactose intolerance    Lower extremity edema 01/11/2022   Lupus ([place])    Multiple gastric ulcers    Palpitations    Personal history of radiation therapy 2009   Prediabetes 09/08/2022   Lab Results Component Value Date  HGBA1C 5.7 (H) 06/29/2021     Rheumatic fever    Rheumatoid arthritis ([place])    SLE (systemic lupus erythematosus related syndrome) ([place])    Urinary incontinence    UTI (urinary tract infection)     Past Surgical History:  Procedure Laterality Date   [place] & tubal ligation    BIOPSY  07/25/2018   Procedure: BIOPSY;  Surgeon: Milus Banister, MD;  Location: [facility] ENDOSCOPY;  Service: Endoscopy;;   BREAST EXCISIONAL BIOPSY Left 1998   BREAST LUMPECTOMY Bilateral    2009   BREAST SURGERY     R breast lumpectomy   CHOLECYSTECTOMY     1978- open    ESOPHAGOGASTRODUODENOSCOPY (EGD) WITH PROPOFOL N/A 07/25/2018   Procedure: ESOPHAGOGASTRODUODENOSCOPY (EGD) WITH PROPOFOL;  Surgeon: Milus Banister, MD;  Location: [place];  Service: Endoscopy;  Laterality: N/A;   REPLACEMENT TOTAL KNEE Left    TONSILLECTOMY     1954   TOTAL KNEE ARTHROPLASTY  08/30/2011   Procedure: TOTAL KNEE ARTHROPLASTY;  Surgeon: Kerin Salen;  Location: [place];  Service: Orthopedics;  Laterality: Right;  Right Total Knee Arthroplasty   TUBAL LIGATION     Patient Active Problem List   Diagnosis Date Noted   Acute pain  of right shoulder 11/01/2022   Hand pain, left 11/01/2022   Hand pain, right 11/01/2022   Aortic atherosclerosis ([place]) 11/01/2022   Diverticular disease 11/01/2022   Upper abdominal pain 11/01/2022   Chronic idiopathic constipation 11/01/2022   DDD (degenerative disc disease), 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/2022   Primary localized osteoarthrosis of multiple sites 09/08/2022   Rheumatoid arthritis ([place]) 09/08/2022   GERD (gastroesophageal reflux disease) 09/08/2022   Prediabetes 09/08/2022   Difficulty standing 09/08/2022   Lower extremity edema 01/11/2022   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   Class 1 obesity due to excess calories with serious comorbidity and body mass index (BMI) of 32.0 to 32.9 in adult 02/17/2021   Psychophysiological insomnia 02/17/2021   Hyperlipidemia with target LDL less than 130 12/30/2020   LVH (left ventricular hypertrophy) due to hypertensive disease, without heart failure 12/30/2020   History of breast cancer  08/07/2018   Family history of coronary artery disease in father 08/07/2018   Family history of breast cancer 10/24/2017   Personal history of colon cancer 10/24/2017   Pain in joint 04/22/2009   Malignant neoplasm of upper-outer quadrant of right breast in female, estrogen receptor positive ([place]) 01/11/2008   Hiatal hernia 12/14/2007   DIVERTICULOSIS, COLON 12/14/2007   Essential hypertension 11/03/2007   Lupus ([place]) 11/03/2007   Flank pain 11/03/2007    PCP: Berniece Pap  REFERRING PROVIDER: Berniece Pap  REFERRING DIAG: Difficulty in walking   THERAPY DIAG:  Pain in right hip  Stiffness of right knee, not elsewhere classified  Stiffness of left knee, not elsewhere classified  Other low back pain  Rationale for Evaluation and Treatment: Rehabilitation  ONSET DATE: 1 year ago.   SUBJECTIVE:   SUBJECTIVE STATEMENT:  11/17/2022 Pt states less soreness in hip today.   Eval:Pt reports R hip pain about 1 year, can't sleep on R side. Can sleep on L comfortably. Also pain with Standing for long time, walking. Better with movement.  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  She does not use No AD. Does not live alone.  [id number]: hands more, is on new meds- not sure if working yet.  Rash at times, fevers, 3x/yr flares.  She also reports increased pain below sternum in abdomen, when bending over cleaning. She has hiatal hernia but has not previously had any symptoms from it.    PERTINENT HISTORY: Lupus, RA, bil TKA,  Previous breast CA,colon  PAIN:  Are you having pain? Yes: NPRS scale: up to 7 /10 Pain location: R hip, R low back Pain description: same Aggravating factors: Sleeping on R side, Standing  Relieving factors: rest  PRECAUTIONS: None  WEIGHT BEARING RESTRICTIONS: No  FALLS:  Has patient fallen in last 6 months? Yes. Number of falls 1 - yesterday, tripped up step, fell into bush, did not hurt herself.     PLOF: Independent  PATIENT GOALS: Less pain in hip/back.   NEXT MD VISIT:   OBJECTIVE:   DIAGNOSTIC FINDINGS: Recent x-ray for Back and hip.   PATIENT SURVEYS: Foto: will do next session  FOTO:  COGNITION: Overall cognitive status: Within functional limits for tasks assessed     SENSATION: WFL  POSTURE:    PALPATION:  hypomobile bil knee, L>R  knee (flexion) ;  Very tender R gr trochanter, mild tenderness into R glute, soreness in R SI, minimal pain in back.    LOWER EXTREMITY ROM:  Active ROM Right eval Left eval  Hip flexion wfl wfl  Hip extension Mod limitation Mod limitation  Hip abduction Mild limitation Mild limitation  Hip adduction    Hip internal rotation    Hip external rotation Mild limitation   Knee flexion 105 85  Knee extension 0 -5  Ankle dorsiflexion    Ankle plantarflexion    Ankle inversion    Ankle eversion     (Blank rows = not tested)  LOWER EXTREMITY MMT:  MMT Right eval Left eval  Hip flexion 4- 4-  Hip extension    Hip abduction 4- (pain) 4-  Hip adduction    Hip internal rotation    Hip external rotation    Knee flexion 4 4  Knee extension 4 4  Ankle dorsiflexion    Ankle plantarflexion    Ankle inversion    Ankle eversion     (Blank rows = not tested)  LOWER EXTREMITY SPECIAL TESTS:  Neg faber, fadir,   + tenderness at gr troch  FUNCTIONAL TESTS:  Sit to stand: in efficient mechanics, not using/bending knees , likely due to stiffness, increased hinging from back/hips.   GAIT: Distance walked: 100 Assistive device utilized: None Level of assistance: Complete Independence Comments: unremarkable   TODAY'S TREATMENT:                                                                                                                              DATE:   11/17/22: Therapeutic Exercise: Aerobic: Supine: SLR with TA, 2 x 10 bil;  Seated: sit to stand x 10  higher mat table, no hands  S/L hip abd 2 x 10 on R:   Standing:   Marching, hip abd, 2 x 10 bil;  Hip ext bil 2 x 10;  all with focus on core and trunk stability and posture;  side stepping and bwd walking 25 ft x 4 each;  Stretches:   Neuromuscular Re-education: Manual Therapy:  Self Care:   11/15/22: Therapeutic Exercise: Aerobic: Supine: Clams GTB x 20;  SLR with TA, x 10 bil;  Review of Heel slides with strap- added to HEP.  Seated: sit to stand x 10  higher mat table, no hands  S/L hip abd 2 x 10 on R:  Standing:   Marching, hip abd, 2 x 10 bil; Hip ext on R x 10;  all with focus on core and trunk stability and posture; Step ups fwd 6 in, x 10 ea bil; 1 UE support;   Stretches:   Neuromuscular Re-education: Manual Therapy:  Therapeutic Activity:  Self Care:  11/10/22: Therapeutic Exercise: Aerobic: Supine: Clams GTB x 20;  SLR with TA, x 15 bil;   Seated: sit to stand x 10 with education on optimal mechanics , higher mat table, no hands  S/L hip abd 2x10 on R:  Standing:   HR, Marching, hip abd, all 2 x 10 bil; with focus on core and trunk stability and posture;  Stretches:   Neuromuscular Re-education: Manual Therapy: STM/tennis  ball to R hip/ glute med Therapeutic Activity: education and practice on stairs, 4 in and 6 in , ascending and descending. Pt with step to pattern, leading with L, for descending 6 in steps. 1 UE/ Hand rail use.  Self Care:   PATIENT EDUCATION:  Education details: reviewed HEP Person educated: Patient Education method: Explanation, Demonstration, Tactile cues, Verbal cues, and Handouts Education comprehension: verbalized understanding, returned demonstration, verbal cues required, tactile cues required, and needs further education  HOME EXERCISE PROGRAM: Access Code: [id number]  ASSESSMENT:  CLINICAL IMPRESSION: 11/17/2022  Pt with improved ability and stability with hip strengthening today. Pt to benefit from progressive strengthening.    OBJECTIVE IMPAIRMENTS: Abnormal gait, decreased  activity tolerance, decreased mobility, difficulty walking, decreased ROM, decreased strength, hypomobility, impaired flexibility, improper body mechanics, and pain.   ACTIVITY LIMITATIONS: bending, sitting, standing, squatting, sleeping, stairs, transfers, and locomotion level  PARTICIPATION LIMITATIONS: meal prep, cleaning, laundry, community activity, and yard work  PERSONAL FACTORS: Past/current experiences Knee stiffness from previous TKA, are also affecting patient's functional outcome.   REHAB POTENTIAL: Good  CLINICAL DECISION MAKING: Stable/uncomplicated  EVALUATION COMPLEXITY: Low   GOALS: Goals reviewed with patient? Yes  SHORT TERM GOALS: Target date: 11/04/2022  Pt to be independent with initial HEP  Goal status: INITIAL  2. Pt to demo ability for optimal mechanics for sit to stand from higher mat table.   Goal status: INITIAL    LONG TERM GOALS: Target date: 12/16/2022  Pt to be independent with final HEP for best care for knees and hip/back.   Goal status: INITIAL  2.  Pt to report decreased pain in R hip to 0-3/10 at night and with standing activity , to improve ability for community activity   Goal status: INITIAL  3.  Pt to demo ability for sit to stand with minor use of hands from regular chair height , to improve ability for IADLs and work duties .  Goal status: INITIAL  4.  Pt to demo improved strength of bil hip abd to at least 4+/5 to improve stability , gait and pain.    Goal status: INITIAL    PLAN:  PT FREQUENCY: 2x/week  PT DURATION: 8 weeks  PLANNED INTERVENTIONS: Therapeutic exercises, Therapeutic activity, Neuromuscular re-education, Balance training, Gait training, Patient/Family education, Self Care, Joint mobilization, Joint manipulation, DME instructions, Aquatic Therapy, Dry Needling, Electrical stimulation, Spinal manipulation, Spinal mobilization, Cryotherapy, Moist heat, Taping, Traction, Ultrasound, Ionotophoresis '4mg'$ /ml  Dexamethasone, and Manual therapy  PLAN FOR NEXT SESSION:    Lyndee Hensen, PT, DPT 9:32 AM  11/17/22

## 2022-11-18 ENCOUNTER — Ambulatory Visit (HOSPITAL_COMMUNITY): Payer: Medicare HMO

## 2022-11-20 ENCOUNTER — Encounter: Payer: Self-pay | Admitting: Internal Medicine

## 2022-11-21 ENCOUNTER — Ambulatory Visit (HOSPITAL_BASED_OUTPATIENT_CLINIC_OR_DEPARTMENT_OTHER)
Admission: RE | Admit: 2022-11-21 | Discharge: 2022-11-21 | Disposition: A | Discharge status: Home / Self Care | Payer: Medicare HMO | Source: Ambulatory Visit | Attending: Internal Medicine | Admitting: Internal Medicine

## 2022-11-21 DIAGNOSIS — R1010 Upper abdominal pain, unspecified: Secondary | ICD-10-CM

## 2022-11-22 ENCOUNTER — Ambulatory Visit: Payer: Medicare HMO | Admitting: Physical Therapy

## 2022-11-22 ENCOUNTER — Ambulatory Visit (INDEPENDENT_AMBULATORY_CARE_PROVIDER_SITE_OTHER): Payer: Medicare HMO | Admitting: Internal Medicine

## 2022-11-22 ENCOUNTER — Encounter: Payer: Self-pay | Admitting: Internal Medicine

## 2022-11-22 VITALS — BP 124/60 | HR 62 | Temp 98.0°F | Ht 67.0 in | Wt 186.2 lb

## 2022-11-22 DIAGNOSIS — Z23 Encounter for immunization: Secondary | ICD-10-CM

## 2022-11-22 DIAGNOSIS — Z91041 Radiographic dye allergy status: Secondary | ICD-10-CM | POA: Diagnosis not present

## 2022-11-22 DIAGNOSIS — R1010 Upper abdominal pain, unspecified: Secondary | ICD-10-CM | POA: Diagnosis not present

## 2022-11-22 DIAGNOSIS — R748 Abnormal levels of other serum enzymes: Secondary | ICD-10-CM | POA: Diagnosis not present

## 2022-11-22 HISTORY — DX: Abnormal levels of other serum enzymes: R74.8

## 2022-11-22 MED ORDER — DIPHENHYDRAMINE HCL 25 MG PO TABS: 25.0000 mg | ORAL_TABLET | Freq: Four times a day (QID) | ORAL | 0 refills | Status: DC | PRN

## 2022-11-22 NOTE — Progress Notes (Signed)
[name]: [id number]   Routine Medical Office Visit  Patient:  [name]      Age: 72 y.o.       Sex:  female  Date:   11/22/2022  PCP:    Loralee Pacas, [place] Provider: Loralee Pacas, MD   Assessment and Plan:   Donni was seen today for elevated amylase follow-up and needs more benadryl.  Contrast media allergy -     diphenhydrAMINE HCl; Take 1 tablet (25 mg total) by mouth every 6 (six) hours as needed (take as directed). Take 50 mg (2 tablets 1 hour prior to procedure) take 1 extra tablet every 4 hours after that as needed for itching swelling or other allergic issues. Go to ER if any shortness of breath.  Dispense: 15 tablet; Refill: 0  Need for shingles vaccine -     Varicella-zoster vaccine IM  Upper abdominal pain Overview: Radiates to the back has been present since late 2023 and we do not have a CAT scan yet for it.  X-ray abdomen showed only degenerative disc disease and chronic constipation.    Assessment & Plan: She is concerned about the possibility for pancreatic cancer but I feel that amylase and lipase makes this unlikely.  Nonetheless the pain is persistent severe difficult to treat and so I do think a CAT scan is an appropriate next step but there is significant risk from the contrast dye as she does have history of anaphylaxis.  We discussed the risk benefits in detail today she will have it done after pretreating with prednisone Benadryl and in an ER setting.  She understands the risks and has capacity to make them informed consent based decision to complete this exam as she is determined to find the cause of the abdominal of note she does have a hiatal hernia history of breast cancer and lupus diverticular disease GERD rheumatoid arthritis all of which might explain.  I did offer to just order an MRCP and skip past the CAT scan but she wants to go ahead and do the CAT scan   Elevated amylase       Clinical Presentation:   The patient is a 72 y.o. female: Active Ambulatory Problems    Diagnosis Date Noted   Malignant neoplasm of upper-outer quadrant of right breast in female, estrogen receptor positive ([place]) 01/11/2008   Essential hypertension 11/03/2007   Hiatal hernia 12/14/2007   DIVERTICULOSIS, COLON 12/14/2007   Lupus ([place]) 11/03/2007   Pain in joint 04/22/2009   Flank pain 11/03/2007   Family history of breast cancer 10/24/2017   Personal history of colon cancer 10/24/2017   History of breast cancer 08/07/2018   Family history of coronary artery disease in father 08/07/2018   Hyperlipidemia with target LDL less than 130 12/30/2020   LVH (left ventricular hypertrophy) due to hypertensive disease, without heart failure 12/30/2020   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   Class 1 obesity due to excess calories with serious comorbidity and body mass index (BMI) of 32.0 to 32.9 in adult 02/17/2021   Psychophysiological insomnia 02/17/2021   Lower extremity edema 01/11/2022   Primary localized osteoarthrosis of multiple sites 09/08/2022   Rheumatoid arthritis ([place]) 09/08/2022   GERD (gastroesophageal reflux disease) 09/08/2022   Prediabetes 09/08/2022   Difficulty standing 09/08/2022   Acute pain of right shoulder 11/01/2022   Hand pain, left 11/01/2022   Hand pain, right 11/01/2022   Aortic atherosclerosis ([place]) 11/01/2022  Diverticular disease 11/01/2022   Upper abdominal pain 11/01/2022   Chronic idiopathic constipation 11/01/2022   DDD (degenerative disc disease), 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/2022   Elevated amylase 11/22/2022   Resolved Ambulatory Problems    Diagnosis Date Noted   NEUROPATHY 09/04/2009   CERUMEN IMPACTION, RIGHT 12/03/2009   OTITIS MEDIA, RIGHT 01/22/2010   HIP PAIN, RIGHT 11/03/2007   FATIGUE 09/04/2009   Cough 09/04/2009   Dysuria 04/22/2009   MAMMOGRAM, ABNORMAL, RIGHT 12/14/2007   Osteoarthritis of right knee 08/29/2011   Chest pain 10/05/2014    Hypokalemia 10/05/2014   Genetic testing 11/24/2017   Syncope and collapse 07/23/2018   Abdominal pain, epigastric    Gastritis and gastroduodenitis    Deficiency anemia 12/30/2020   Encounter for general adult medical examination with abnormal findings 12/30/2020   Past Medical History:  Diagnosis Date   Arthritis    Back pain    Blood transfusion    Breast cancer ([place]) 2009   Cataract    Chicken pox    Colon cancer ([place]) 2003   Depression    Diverticulitis    Edema of both ankles    Gallbladder problem    Gastritis    Hepatitis    History of stomach ulcers    Hypertension    Joint pain    Lactose intolerance    Multiple gastric ulcers    Palpitations    Personal history of radiation therapy 2009   Rheumatic fever    SLE (systemic lupus erythematosus related syndrome) (HCC)    Urinary incontinence    UTI (urinary tract infection)     Outpatient Medications Prior to Visit  Medication Sig   cholecalciferol 25 MCG (1000 UT) tablet Take by mouth.   [employer] ALLERGY RELIEF 25 MG capsule PLEASE SEE ATTACHED FOR DETAILED DIRECTIONS   [employer] Fiber Gummy Bears Children CHEW Chew 20 g by mouth daily at 6 (six) AM.   cyclobenzaprine (FLEXERIL) 5 MG tablet Take by mouth.   hydrochlorothiazide (HYDRODIURIL) 25 MG tablet Take 1 tablet (25 mg total) by mouth daily.   hydroxychloroquine (PLAQUENIL) 200 MG tablet Take 200 mg by mouth 2 (two) times daily.   ibuprofen (ADVIL) 800 MG tablet Take 800 mg by mouth every 8 (eight) hours as needed.   losartan (COZAAR) 25 MG tablet Take 1 tablet (25 mg total) by mouth daily.   metoprolol succinate (TOPROL-XL) 50 MG 24 hr tablet Take 1 tablet (50 mg total) by mouth daily.   Multiple Vitamins-Minerals (EMERGEN-C IMMUNE PLUS PO) Take 1 tablet by mouth daily.   omeprazole (PRILOSEC) 40 MG capsule Take 1 capsule (40 mg total) by mouth daily.   potassium chloride (KLOR-CON) 10 MEQ tablet Take 1 tablet (10 mEq total) by mouth daily.   rosuvastatin  (CRESTOR) 40 MG tablet Take 1 tablet (40 mg total) by mouth daily.   diphenhydrAMINE (BENADRYL) 50 MG capsule Take on date/time instructed by prescribing provider. (optional) diphenhydrAMINE (BENADRYL) 50 mg IV/PO within 1 hour of contrast injection Call [phone] for instructions if needed.   [DISCONTINUED] sennosides-docusate sodium (SENOKOT-S) 8.6-50 MG tablet Take 1 tablet by mouth daily.   No facility-administered medications prior to visit.     Chief Complaint  Patient presents with   Elevated Amylase follow-up    Amylase of 125.   Needs more Benadryl    Requesting three for the imaging procedure.    HPI  Patient reports planned imaging test with contrast cancelled 2x in a row over concerns about the contrast dye, but  replanned for tomorrow just needs more benadryl  Ibuprofen helps the pain epigastric Tramadol does not She doesn't want carafate or Maalox or norco or anything for the persistent pain        Clinical Data Analysis:  Physical Exam  BP 124/60 (BP Location: Left Arm, Patient Position: Sitting)   Pulse 62   Temp 98 F (36.7 C) (Temporal)   Ht '5\' 7"'$  (1.702 m)   Wt 186 lb 3.2 oz (84.5 kg)   SpO2 100%   BMI 29.16 kg/m  Wt Readings from Last 10 Encounters:  11/22/22 186 lb 3.2 oz (84.5 kg)  11/01/22 180 lb 3.2 oz (81.7 kg)  09/08/22 186 lb 6.4 oz (84.6 kg)  08/16/22 185 lb 3.2 oz (84 kg)  06/29/22 187 lb 1.6 oz (84.9 kg)  06/04/22 180 lb (81.6 kg)  04/12/22 188 lb (85.3 kg)  01/11/22 188 lb 1.6 oz (85.3 kg)  01/03/22 187 lb 6.4 oz (85 kg)  12/28/21 187 lb 6.4 oz (85 kg)   Vital signs reviewed.  Nursing notes reviewed. Weight trend reviewed. Abnormalities noted: weight stable Body mass index is 29.16 kg/m. Overweight  by BMI criteria is noted but in my medical opinion, BMI is an unreliable indicator of healthy body composition due to its inability to reflect lean muscle mass.  General Appearance:  Well developed, well nourished female in no acute  distress.   Pulmonary:  Normal work of breathing at rest, no respiratory distress apparent. SpO2: 100 %  Musculoskeletal: All extremities are intact.  Neurological:  Awake, alert. No obvious focal neurological deficits or cognitive impairments.  Sensorium seems unclouded. Psychiatric:  Appropriate mood, pleasant demeanor Problem-specific findings:  upper abdomen pain   Results Reviewed: (reviewed labs/imaging may be also be found in the assessment / plan section):  No results found for any visits on 11/22/22.  Recent Results (from the past 2160 hour(s))  CBC     Status: None   Collection Time: 09/08/22 12:37 PM  Result Value Ref Range   WBC 4.4 4.0 - 10.5 K/uL   RBC 4.16 3.87 - 5.11 Mil/uL   Platelets 214.0 150.0 - 400.0 K/uL   Hemoglobin 12.9 12.0 - 15.0 g/dL   HCT 39.1 36.0 - 46.0 %   MCV 93.9 78.0 - 100.0 fl   MCHC 32.9 30.0 - 36.0 g/dL   RDW 13.9 11.5 - 15.5 %  Comp Met (CMET)     Status: None   Collection Time: 09/08/22 12:37 PM  Result Value Ref Range   Sodium 139 135 - 145 mEq/L   Potassium 4.0 3.5 - 5.1 mEq/L   Chloride 104 96 - 112 mEq/L   CO2 28 19 - 32 mEq/L   Glucose, Bld 86 70 - 99 mg/dL   BUN 12 6 - 23 mg/dL   Creatinine, Ser 0.69 0.40 - 1.20 mg/dL   Total Bilirubin 0.5 0.2 - 1.2 mg/dL   Alkaline Phosphatase 63 39 - 117 U/L   AST 26 0 - 37 U/L   ALT 20 0 - 35 U/L   Total Protein 7.2 6.0 - 8.3 g/dL   Albumin 4.0 3.5 - 5.2 g/dL   GFR 86.01 >60.00 mL/min    Comment: Calculated using the CKD-EPI Creatinine Equation (2021)   Calcium 10.0 8.4 - 10.5 mg/dL  Urinalysis, Routine w reflex microscopic     Status: Abnormal   Collection Time: 09/08/22 12:37 PM  Result Value Ref Range   Color, Urine YELLOW Yellow;Lt. Yellow;Straw;Dark Yellow;Amber;Green;Red;Brown   APPearance  CLEAR Clear;Turbid;Slightly Cloudy;Cloudy   Specific Gravity, Urine 1.015 1.000 - 1.030   pH 6.5 5.0 - 8.0   Total Protein, Urine NEGATIVE Negative   Urine Glucose NEGATIVE Negative   Ketones,  ur NEGATIVE Negative   Bilirubin Urine NEGATIVE Negative   Hgb urine dipstick NEGATIVE Negative   Urobilinogen, UA 1.0 0.0 - 1.0   Leukocytes,Ua NEGATIVE Negative   Nitrite NEGATIVE Negative   WBC, UA 0-2/hpf 0-2/hpf   RBC / HPF 0-2/hpf 0-2/hpf   Squamous Epithelial / HPF Rare(0-4/hpf) Rare(0-4/hpf)   Renal Epithel, UA None Seen None   Bacteria, UA Rare(<10/hpf) (A) None  HgB A1c     Status: None   Collection Time: 09/08/22 12:37 PM  Result Value Ref Range   Hgb A1c MFr Bld 5.6 4.6 - 6.5 %    Comment: Glycemic Control Guidelines for People with Diabetes:Non Diabetic:  <6%Goal of Therapy: <7%Additional Action Suggested:  >8%   Amylase     Status: Abnormal   Collection Time: 11/15/22 12:02 PM  Result Value Ref Range   Amylase 125 (H) 31 - 110 U/L  Lipase     Status: None   Collection Time: 11/15/22 12:02 PM  Result Value Ref Range   Lipase 30 14 - 85 U/L        Signed: Loralee Pacas, MD 11/22/2022 8:30 PM

## 2022-11-22 NOTE — Assessment & Plan Note (Signed)
She is concerned about the possibility for pancreatic cancer but I feel that amylase and lipase makes this unlikely.  Nonetheless the pain is persistent severe difficult to treat and so I do think a CAT scan is an appropriate next step but there is significant risk from the contrast dye as she does have history of anaphylaxis.  We discussed the risk benefits in detail today she will have it done after pretreating with prednisone Benadryl and in an ER setting.  She understands the risks and has capacity to make them informed consent based decision to complete this exam as she is determined to find the cause of the abdominal of note she does have a hiatal hernia history of breast cancer and lupus diverticular disease GERD rheumatoid arthritis all of which might explain.  I did offer to just order an MRCP and skip past the CAT scan but she wants to go ahead and do the CAT scan

## 2022-11-22 NOTE — Therapy (Deleted)
OUTPATIENT PHYSICAL THERAPY LOWER EXTREMITY TREATMENT   Patient Name: [name] MRN: [id number] DOB:11/02/1950, 72 y.o., female Today's Date: 11/22/2022  END OF SESSION:       Past Medical History:  Diagnosis Date   Aortic atherosclerosis ([place]) 11/01/2022   On 2022 CT abdomen.   Arthritis    R knee, hands    Back pain    Blood transfusion    1986- post-partial hysterectomy   Breast cancer ([place]) 2009   right lumpectomy and radiation   Cataract    Chicken pox    Colon cancer ([place]) 2003   per pt report, cancer found in a colon polyp in 2003, procedure done in [place].  did not require surgery, chemo etc.     Depression    Difficulty standing 09/08/2022   Due to hip pain mainly- she agreed to physical therapy if insurance will cover   Diverticular disease 11/01/2022   On 2022 CT abd   Diverticulitis    Edema of both ankles    Family history of breast cancer 10/24/2017   Gallbladder problem    Gastritis    GERD (gastroesophageal reflux disease)    recently taken off anti reflux tx   Hepatitis    Hep. A- 1978   Hiatal hernia    History of stomach ulcers    Hypertension    Joint pain    Lactose intolerance    Lower extremity edema 01/11/2022   Lupus ([place])    Multiple gastric ulcers    Palpitations    Personal history of radiation therapy 2009   Prediabetes 09/08/2022   Lab Results Component Value Date  HGBA1C 5.7 (H) 06/29/2021     Rheumatic fever    Rheumatoid arthritis ([place])    SLE (systemic lupus erythematosus related syndrome) ([place])    Urinary incontinence    UTI (urinary tract infection)    Past Surgical History:  Procedure Laterality Date   [place] & tubal ligation    BIOPSY  07/25/2018   Procedure: BIOPSY;  Surgeon: Milus Banister, MD;  Location: [facility] ENDOSCOPY;  Service: Endoscopy;;   BREAST EXCISIONAL BIOPSY Left 1998   BREAST LUMPECTOMY Bilateral    2009   BREAST SURGERY     R breast  lumpectomy   CHOLECYSTECTOMY     1978- open    ESOPHAGOGASTRODUODENOSCOPY (EGD) WITH PROPOFOL N/A 07/25/2018   Procedure: ESOPHAGOGASTRODUODENOSCOPY (EGD) WITH PROPOFOL;  Surgeon: Milus Banister, MD;  Location: [place];  Service: Endoscopy;  Laterality: N/A;   REPLACEMENT TOTAL KNEE Left    TONSILLECTOMY     1954   TOTAL KNEE ARTHROPLASTY  08/30/2011   Procedure: TOTAL KNEE ARTHROPLASTY;  Surgeon: Kerin Salen;  Location: [place];  Service: Orthopedics;  Laterality: Right;  Right Total Knee Arthroplasty   TUBAL LIGATION     Patient Active Problem List   Diagnosis Date Noted   Acute pain of right shoulder 11/01/2022   Hand pain, left 11/01/2022   Hand pain, right 11/01/2022   Aortic atherosclerosis ([place]) 11/01/2022   Diverticular disease 11/01/2022   Upper abdominal pain 11/01/2022   Chronic idiopathic constipation 11/01/2022   DDD (degenerative disc disease), 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/2022   Primary localized osteoarthrosis of multiple sites 09/08/2022   Rheumatoid arthritis ([place]) 09/08/2022   GERD (gastroesophageal reflux disease) 09/08/2022   Prediabetes 09/08/2022   Difficulty standing 09/08/2022   Lower extremity edema 01/11/2022   Thiamine deficiency  neuropathy 02/17/2021   Class 1 obesity due to excess calories with serious comorbidity and body mass index (BMI) of 32.0 to 32.9 in adult 02/17/2021   Psychophysiological insomnia 02/17/2021   Hyperlipidemia with target LDL less than 130 12/30/2020   LVH (left ventricular hypertrophy) due to hypertensive disease, without heart failure 12/30/2020   History of breast cancer 08/07/2018   Family history of coronary artery disease in father 08/07/2018   Family history of breast cancer 10/24/2017   Personal history of colon cancer 10/24/2017   Pain in joint 04/22/2009   Malignant neoplasm of upper-outer quadrant of right breast in female, estrogen receptor positive ([place]) 01/11/2008   Hiatal hernia 12/14/2007   DIVERTICULOSIS, COLON  12/14/2007   Essential hypertension 11/03/2007   Lupus ([place]) 11/03/2007   Flank pain 11/03/2007    PCP: Berniece Pap  REFERRING PROVIDER: Berniece Pap  REFERRING DIAG: Difficulty in walking   THERAPY DIAG:  No diagnosis found.  Rationale for Evaluation and Treatment: Rehabilitation  ONSET DATE: 1 year ago.   SUBJECTIVE:   SUBJECTIVE STATEMENT:  11/22/2022 Pt states less soreness in hip today.   Eval:Pt reports R hip pain about 1 year, can't sleep on R side. Can sleep on L comfortably. Also pain with Standing for long time, walking. Better with movement.  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  She does not use No AD. Does not live alone.  [id number]: hands more, is on new meds- not sure if working yet.  Rash at times, fevers, 3x/yr flares.  She also reports increased pain below sternum in abdomen, when bending over cleaning. She has hiatal hernia but has not previously had any symptoms from it.    PERTINENT HISTORY: Lupus, RA, bil TKA,  Previous breast CA,colon  PAIN:  Are you having pain? Yes: NPRS scale: up to 7 /10 Pain location: R hip, R low back Pain description: same Aggravating factors: Sleeping on R side, Standing  Relieving factors: rest  PRECAUTIONS: None  WEIGHT BEARING RESTRICTIONS: No  FALLS:  Has patient fallen in last 6 months? Yes. Number of falls 1 - yesterday, tripped up step, fell into bush, did not hurt herself.    PLOF: Independent  PATIENT GOALS: Less pain in hip/back.   NEXT MD VISIT:   OBJECTIVE:   DIAGNOSTIC FINDINGS: Recent x-ray for Back and hip.   PATIENT SURVEYS: Foto: will do next session  FOTO:  COGNITION: Overall cognitive status: Within functional limits for tasks assessed     SENSATION: WFL  POSTURE:    PALPATION:  hypomobile bil knee, L>R  knee (flexion) ;  Very tender R gr trochanter, mild tenderness into R glute, soreness in R SI, minimal pain in back.    LOWER  EXTREMITY ROM:  Active ROM Right eval Left eval  Hip flexion wfl wfl  Hip extension Mod limitation Mod limitation  Hip abduction Mild limitation Mild limitation  Hip adduction    Hip internal rotation    Hip external rotation Mild limitation   Knee flexion 105 85  Knee extension 0 -5  Ankle dorsiflexion    Ankle plantarflexion    Ankle inversion    Ankle eversion     (Blank rows = not tested)  LOWER EXTREMITY MMT:  MMT Right eval Left eval  Hip flexion 4- 4-  Hip extension    Hip abduction 4- (pain) 4-  Hip adduction    Hip internal rotation    Hip external rotation  Knee flexion 4 4  Knee extension 4 4  Ankle dorsiflexion    Ankle plantarflexion    Ankle inversion    Ankle eversion     (Blank rows = not tested)  LOWER EXTREMITY SPECIAL TESTS:  Neg faber, fadir,   + tenderness at gr troch  FUNCTIONAL TESTS:  Sit to stand: in efficient mechanics, not using/bending knees , likely due to stiffness, increased hinging from back/hips.   GAIT: Distance walked: 100 Assistive device utilized: None Level of assistance: Complete Independence Comments: unremarkable   TODAY'S TREATMENT:                                                                                                                              DATE:   11/17/22: Therapeutic Exercise: Aerobic: Supine: SLR with TA, 2 x 10 bil;  Seated: sit to stand x 10  higher mat table, no hands  S/L hip abd 2 x 10 on R:  Standing:   Marching, hip abd, 2 x 10 bil;  Hip ext bil 2 x 10;  all with focus on core and trunk stability and posture;  side stepping and bwd walking 25 ft x 4 each;  Stretches:   Neuromuscular Re-education: Manual Therapy:  Self Care:   11/15/22: Therapeutic Exercise: Aerobic: Supine: Clams GTB x 20;  SLR with TA, x 10 bil;  Review of Heel slides with strap- added to HEP.  Seated: sit to stand x 10  higher mat table, no hands  S/L hip abd 2 x 10 on R:  Standing:   Marching, hip abd, 2 x  10 bil; Hip ext on R x 10;  all with focus on core and trunk stability and posture; Step ups fwd 6 in, x 10 ea bil; 1 UE support;   Stretches:   Neuromuscular Re-education: Manual Therapy:  Therapeutic Activity:  Self Care:  11/10/22: Therapeutic Exercise: Aerobic: Supine: Clams GTB x 20;  SLR with TA, x 15 bil;   Seated: sit to stand x 10 with education on optimal mechanics , higher mat table, no hands  S/L hip abd 2x10 on R:  Standing:   HR, Marching, hip abd, all 2 x 10 bil; with focus on core and trunk stability and posture;  Stretches:   Neuromuscular Re-education: Manual Therapy: STM/tennis ball to R hip/ glute med Therapeutic Activity: education and practice on stairs, 4 in and 6 in , ascending and descending. Pt with step to pattern, leading with L, for descending 6 in steps. 1 UE/ Hand rail use.  Self Care:   PATIENT EDUCATION:  Education details: reviewed HEP Person educated: Patient Education method: Explanation, Demonstration, Tactile cues, Verbal cues, and Handouts Education comprehension: verbalized understanding, returned demonstration, verbal cues required, tactile cues required, and needs further education  HOME EXERCISE PROGRAM: Access Code: [id number]  ASSESSMENT:  CLINICAL IMPRESSION: 11/22/2022  Pt with improved ability and stability with hip strengthening today. Pt to benefit from progressive strengthening.  OBJECTIVE IMPAIRMENTS: Abnormal gait, decreased activity tolerance, decreased mobility, difficulty walking, decreased ROM, decreased strength, hypomobility, impaired flexibility, improper body mechanics, and pain.   ACTIVITY LIMITATIONS: bending, sitting, standing, squatting, sleeping, stairs, transfers, and locomotion level  PARTICIPATION LIMITATIONS: meal prep, cleaning, laundry, community activity, and yard work  PERSONAL FACTORS: Past/current experiences Knee stiffness from previous TKA, are also affecting patient's functional outcome.   REHAB  POTENTIAL: Good  CLINICAL DECISION MAKING: Stable/uncomplicated  EVALUATION COMPLEXITY: Low   GOALS: Goals reviewed with patient? Yes  SHORT TERM GOALS: Target date: 11/04/2022  Pt to be independent with initial HEP  Goal status: INITIAL  2. Pt to demo ability for optimal mechanics for sit to stand from higher mat table.   Goal status: INITIAL    LONG TERM GOALS: Target date: 12/16/2022  Pt to be independent with final HEP for best care for knees and hip/back.   Goal status: INITIAL  2.  Pt to report decreased pain in R hip to 0-3/10 at night and with standing activity , to improve ability for community activity   Goal status: INITIAL  3.  Pt to demo ability for sit to stand with minor use of hands from regular chair height , to improve ability for IADLs and work duties .  Goal status: INITIAL  4.  Pt to demo improved strength of bil hip abd to at least 4+/5 to improve stability , gait and pain.    Goal status: INITIAL    PLAN:  PT FREQUENCY: 2x/week  PT DURATION: 8 weeks  PLANNED INTERVENTIONS: Therapeutic exercises, Therapeutic activity, Neuromuscular re-education, Balance training, Gait training, Patient/Family education, Self Care, Joint mobilization, Joint manipulation, DME instructions, Aquatic Therapy, Dry Needling, Electrical stimulation, Spinal manipulation, Spinal mobilization, Cryotherapy, Moist heat, Taping, Traction, Ultrasound, Ionotophoresis '4mg'$ /ml Dexamethasone, and Manual therapy  PLAN FOR NEXT SESSION:    Lyndee Hensen, PT, DPT 2:29 PM  11/22/22

## 2022-11-23 ENCOUNTER — Ambulatory Visit (HOSPITAL_COMMUNITY)
Admission: RE | Admit: 2022-11-23 | Discharge: 2022-11-23 | Disposition: A | Discharge status: Home / Self Care | Payer: Medicare HMO | Source: Ambulatory Visit | Attending: Internal Medicine | Admitting: Internal Medicine

## 2022-11-23 ENCOUNTER — Other Ambulatory Visit: Payer: Self-pay | Admitting: Internal Medicine

## 2022-11-23 ENCOUNTER — Other Ambulatory Visit (HOSPITAL_BASED_OUTPATIENT_CLINIC_OR_DEPARTMENT_OTHER): Payer: Medicare HMO

## 2022-11-23 DIAGNOSIS — K769 Liver disease, unspecified: Secondary | ICD-10-CM | POA: Diagnosis not present

## 2022-11-23 DIAGNOSIS — R1010 Upper abdominal pain, unspecified: Secondary | ICD-10-CM

## 2022-11-23 DIAGNOSIS — K5730 Diverticulosis of large intestine without perforation or abscess without bleeding: Secondary | ICD-10-CM | POA: Diagnosis not present

## 2022-11-23 MED ORDER — IOHEXOL 300 MG/ML  SOLN
100.0000 mL | Freq: Once | INTRAMUSCULAR | Status: AC | PRN
  Administered 2022-11-23: 100 mL via INTRAVENOUS

## 2022-11-23 MED ORDER — SODIUM CHLORIDE (PF) 0.9 % IJ SOLN
INTRAMUSCULAR | Status: AC
  Filled 2022-11-23: qty 50

## 2022-11-24 ENCOUNTER — Encounter: Payer: Medicare HMO | Admitting: Physical Therapy

## 2022-11-24 ENCOUNTER — Ambulatory Visit (HOSPITAL_BASED_OUTPATIENT_CLINIC_OR_DEPARTMENT_OTHER): Admission: RE | Admit: 2022-11-24 | Payer: Medicare HMO | Source: Ambulatory Visit

## 2022-11-24 ENCOUNTER — Encounter (HOSPITAL_BASED_OUTPATIENT_CLINIC_OR_DEPARTMENT_OTHER): Payer: Self-pay

## 2022-11-25 ENCOUNTER — Other Ambulatory Visit: Payer: Self-pay

## 2022-11-25 DIAGNOSIS — R748 Abnormal levels of other serum enzymes: Secondary | ICD-10-CM

## 2022-11-25 DIAGNOSIS — R1010 Upper abdominal pain, unspecified: Secondary | ICD-10-CM

## 2022-11-26 ENCOUNTER — Ambulatory Visit: Payer: Medicare HMO | Admitting: Physical Therapy

## 2022-11-26 ENCOUNTER — Encounter: Payer: Self-pay | Admitting: Physical Therapy

## 2022-11-26 ENCOUNTER — Telehealth: Payer: Self-pay

## 2022-11-26 DIAGNOSIS — M5459 Other low back pain: Secondary | ICD-10-CM

## 2022-11-26 DIAGNOSIS — M25551 Pain in right hip: Secondary | ICD-10-CM | POA: Diagnosis not present

## 2022-11-26 DIAGNOSIS — M25662 Stiffness of left knee, not elsewhere classified: Secondary | ICD-10-CM | POA: Diagnosis not present

## 2022-11-26 DIAGNOSIS — M25661 Stiffness of right knee, not elsewhere classified: Secondary | ICD-10-CM

## 2022-11-26 NOTE — Therapy (Signed)
OUTPATIENT PHYSICAL THERAPY LOWER EXTREMITY TREATMENT   Patient Name: [name] MRN: [id number] DOB:16-Jul-1951, 72 y.o., female Today's Date: 11/26/2022  END OF SESSION:  PT End of Session - 11/26/22 1412     Visit Number 8    Number of Visits 16    Date for PT Re-Evaluation 12/16/22    Authorization Type A[employer] Medicare    PT Start Time 0932    PT Stop Time [id number]    PT Time Calculation (min) 43 min    Activity Tolerance Patient tolerated treatment well    Behavior During Therapy [facility] for tasks assessed/performed                 Past Medical History:  Diagnosis Date   Aortic atherosclerosis ([place]) 11/01/2022   On 2022 CT abdomen.   Arthritis    R knee, hands    Back pain    Blood transfusion    1986- post-partial hysterectomy   Breast cancer ([place]) 2009   right lumpectomy and radiation   Cataract    Chicken pox    Colon cancer ([place]) 2003   per pt report, cancer found in a colon polyp in 2003, procedure done in [place].  did not require surgery, chemo etc.     Depression    Difficulty standing 09/08/2022   Due to hip pain mainly- she agreed to physical therapy if insurance will cover   Diverticular disease 11/01/2022   On 2022 CT abd   Diverticulitis    Edema of both ankles    Family history of breast cancer 10/24/2017   Gallbladder problem    Gastritis    GERD (gastroesophageal reflux disease)    recently taken off anti reflux tx   Hepatitis    Hep. A- 1978   Hiatal hernia    History of stomach ulcers    Hypertension    Joint pain    Lactose intolerance    Lower extremity edema 01/11/2022   Lupus ([place])    Multiple gastric ulcers    Palpitations    Personal history of radiation therapy 2009   Prediabetes 09/08/2022   Lab Results Component Value Date  HGBA1C 5.7 (H) 06/29/2021     Rheumatic fever    Rheumatoid arthritis ([place])    SLE (systemic lupus erythematosus related syndrome) ([place])    Urinary incontinence    UTI (urinary tract infection)     Past Surgical History:  Procedure Laterality Date   [place] & tubal ligation    BIOPSY  07/25/2018   Procedure: BIOPSY;  Surgeon: Milus Banister, MD;  Location: [facility] ENDOSCOPY;  Service: Endoscopy;;   BREAST EXCISIONAL BIOPSY Left 1998   BREAST LUMPECTOMY Bilateral    2009   BREAST SURGERY     R breast lumpectomy   CHOLECYSTECTOMY     1978- open    ESOPHAGOGASTRODUODENOSCOPY (EGD) WITH PROPOFOL N/A 07/25/2018   Procedure: ESOPHAGOGASTRODUODENOSCOPY (EGD) WITH PROPOFOL;  Surgeon: Milus Banister, MD;  Location: [place];  Service: Endoscopy;  Laterality: N/A;   REPLACEMENT TOTAL KNEE Left    TONSILLECTOMY     1954   TOTAL KNEE ARTHROPLASTY  08/30/2011   Procedure: TOTAL KNEE ARTHROPLASTY;  Surgeon: Kerin Salen;  Location: [place];  Service: Orthopedics;  Laterality: Right;  Right Total Knee Arthroplasty   TUBAL LIGATION     Patient Active Problem List   Diagnosis Date Noted   Elevated  amylase 11/22/2022   Acute pain of right shoulder 11/01/2022   Hand pain, left 11/01/2022   Hand pain, right 11/01/2022   Aortic atherosclerosis ([place]) 11/01/2022   Diverticular disease 11/01/2022   Upper abdominal pain 11/01/2022   Chronic idiopathic constipation 11/01/2022   DDD (degenerative disc disease), 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/2022   Primary localized osteoarthrosis of multiple sites 09/08/2022   Rheumatoid arthritis ([place]) 09/08/2022   GERD (gastroesophageal reflux disease) 09/08/2022   Prediabetes 09/08/2022   Difficulty standing 09/08/2022   Lower extremity edema 01/11/2022   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   Class 1 obesity due to excess calories with serious comorbidity and body mass index (BMI) of 32.0 to 32.9 in adult 02/17/2021   Psychophysiological insomnia 02/17/2021   Hyperlipidemia with target LDL less than 130 12/30/2020   LVH (left ventricular hypertrophy) due to hypertensive disease, without heart failure 12/30/2020    History of breast cancer 08/07/2018   Family history of coronary artery disease in father 08/07/2018   Family history of breast cancer 10/24/2017   Personal history of colon cancer 10/24/2017   Pain in joint 04/22/2009   Malignant neoplasm of upper-outer quadrant of right breast in female, estrogen receptor positive ([place]) 01/11/2008   Hiatal hernia 12/14/2007   DIVERTICULOSIS, COLON 12/14/2007   Essential hypertension 11/03/2007   Lupus ([place]) 11/03/2007   Flank pain 11/03/2007    PCP: Berniece Pap  REFERRING PROVIDER: Berniece Pap  REFERRING DIAG: Difficulty in walking   THERAPY DIAG:  Pain in right hip  Stiffness of right knee, not elsewhere classified  Stiffness of left knee, not elsewhere classified  Other low back pain  Rationale for Evaluation and Treatment: Rehabilitation  ONSET DATE: 1 year ago.   SUBJECTIVE:   SUBJECTIVE STATEMENT:  11/26/2022 Pt states less pain in hip.   Eval:Pt reports R hip pain about 1 year, can't sleep on R side. Can sleep on L comfortably. Also pain with Standing for long time, walking. Better with movement.  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  She does not use No AD. Does not live alone.  [id number]: hands more, is on new meds- not sure if working yet.  Rash at times, fevers, 3x/yr flares.  She also reports increased pain below sternum in abdomen, when bending over cleaning. She has hiatal hernia but has not previously had any symptoms from it.    PERTINENT HISTORY: Lupus, RA, bil TKA,  Previous breast CA,colon  PAIN:  Are you having pain? Yes: NPRS scale: up to 7 /10 Pain location: R hip, R low back Pain description: same Aggravating factors: Sleeping on R side, Standing  Relieving factors: rest  PRECAUTIONS: None  WEIGHT BEARING RESTRICTIONS: No  FALLS:  Has patient fallen in last 6 months? Yes. Number of falls 1 - yesterday, tripped up step, fell into bush, did not hurt  herself.    PLOF: Independent  PATIENT GOALS: Less pain in hip/back.   NEXT MD VISIT:   OBJECTIVE:   DIAGNOSTIC FINDINGS: Recent x-ray for Back and hip.   PATIENT SURVEYS: Foto: will do next session  FOTO:  COGNITION: Overall cognitive status: Within functional limits for tasks assessed     SENSATION: WFL  POSTURE:    PALPATION:  hypomobile bil knee, L>R  knee (flexion) ;  Very tender R gr trochanter, mild tenderness into R glute, soreness in R SI, minimal pain in back.    LOWER EXTREMITY ROM:  Active ROM Right eval Left eval  Hip flexion wfl wfl  Hip extension Mod limitation Mod limitation  Hip abduction Mild limitation Mild limitation  Hip adduction    Hip internal rotation    Hip external rotation Mild limitation   Knee flexion 105 85  Knee extension 0 -5  Ankle dorsiflexion    Ankle plantarflexion    Ankle inversion    Ankle eversion     (Blank rows = not tested)  LOWER EXTREMITY MMT:  MMT Right eval Left eval  Hip flexion 4- 4-  Hip extension    Hip abduction 4- (pain) 4-  Hip adduction    Hip internal rotation    Hip external rotation    Knee flexion 4 4  Knee extension 4 4  Ankle dorsiflexion    Ankle plantarflexion    Ankle inversion    Ankle eversion     (Blank rows = not tested)  LOWER EXTREMITY SPECIAL TESTS:  Neg faber, fadir,   + tenderness at gr troch  FUNCTIONAL TESTS:  Sit to stand: in efficient mechanics, not using/bending knees , likely due to stiffness, increased hinging from back/hips.   GAIT: Distance walked: 100 Assistive device utilized: None Level of assistance: Complete Independence Comments: unremarkable   TODAY'S TREATMENT:                                                                                                                              DATE:   11/26/22: Therapeutic Exercise: Aerobic: Supine: SLR with TA, 2 x 10 bil; heel slides on L with strap x 15;  Seated:  S/L hip abd 2 x 10 on R:  Standing:    Marching, hip abd, 2 x 10 bil;  with focus on core and trunk stability and posture;  side stepping ft x 4 each;  Step ups fwd 6 in, x 10 ea bil; no UE support; up /down 5 steps x 5 no UE support, reciprocal.  Stretches:   Neuromuscular Re-education: Manual Therapy:  Self Care:  11/17/22: Therapeutic Exercise: Aerobic: Supine: SLR with TA, 2 x 10 bil;  Seated: sit to stand x 10  higher mat table, no hands  S/L hip abd 2 x 10 on R:  Standing:   Marching, hip abd, 2 x 10 bil;  Hip ext bil 2 x 10;  all with focus on core and trunk stability and posture;  side stepping and bwd walking 25 ft x 4 each;  Stretches:   Neuromuscular Re-education: Manual Therapy:  Self Care:   11/15/22: Therapeutic Exercise: Aerobic: Supine:  SLR with TA, x 10 bil;   Seated: sit to stand x 10  higher mat table, no hands  S/L hip abd 2 x 10 on R:  Standing:   Marching, hip abd, 2 x 10 bil;  Hip ext x 10 bil;  all with focus on core and trunk stability and posture;   Step ups fwd 6 in, x 10 ea bil; 1 UE support;  Stretches:   Neuromuscular Re-education: Manual Therapy:  Therapeutic Activity:  Self Care:   PATIENT EDUCATION:  Education details: reviewed HEP Person educated: Patient Education method: Explanation, Demonstration, Tactile cues, Verbal cues, and Handouts Education comprehension: verbalized understanding, returned demonstration, verbal cues required, tactile cues required, and needs further education  HOME EXERCISE PROGRAM: Access Code: [id number]  ASSESSMENT:  CLINICAL IMPRESSION: 11/26/2022  Pt with improving strength and stability in hips, seen with standing exercises today. Pt to benefit from progressive strengthening and work on ROM for knees.    OBJECTIVE IMPAIRMENTS: Abnormal gait, decreased activity tolerance, decreased mobility, difficulty walking, decreased ROM, decreased strength, hypomobility, impaired flexibility, improper body mechanics, and pain.   ACTIVITY LIMITATIONS:  bending, sitting, standing, squatting, sleeping, stairs, transfers, and locomotion level  PARTICIPATION LIMITATIONS: meal prep, cleaning, laundry, community activity, and yard work  PERSONAL FACTORS: Past/current experiences Knee stiffness from previous TKA, are also affecting patient's functional outcome.   REHAB POTENTIAL: Good  CLINICAL DECISION MAKING: Stable/uncomplicated  EVALUATION COMPLEXITY: Low   GOALS: Goals reviewed with patient? Yes  SHORT TERM GOALS: Target date: 11/04/2022  Pt to be independent with initial HEP  Goal status: INITIAL  2. Pt to demo ability for optimal mechanics for sit to stand from higher mat table.   Goal status: INITIAL    LONG TERM GOALS: Target date: 12/16/2022  Pt to be independent with final HEP for best care for knees and hip/back.   Goal status: INITIAL  2.  Pt to report decreased pain in R hip to 0-3/10 at night and with standing activity , to improve ability for community activity   Goal status: INITIAL  3.  Pt to demo ability for sit to stand with minor use of hands from regular chair height , to improve ability for IADLs and work duties .  Goal status: INITIAL  4.  Pt to demo improved strength of bil hip abd to at least 4+/5 to improve stability , gait and pain.    Goal status: INITIAL    PLAN:  PT FREQUENCY: 2x/week  PT DURATION: 8 weeks  PLANNED INTERVENTIONS: Therapeutic exercises, Therapeutic activity, Neuromuscular re-education, Balance training, Gait training, Patient/Family education, Self Care, Joint mobilization, Joint manipulation, DME instructions, Aquatic Therapy, Dry Needling, Electrical stimulation, Spinal manipulation, Spinal mobilization, Cryotherapy, Moist heat, Taping, Traction, Ultrasound, Ionotophoresis '4mg'$ /ml Dexamethasone, and Manual therapy  PLAN FOR NEXT SESSION:    Lyndee Hensen, PT, DPT 2:14 PM  11/26/22

## 2022-11-26 NOTE — Telephone Encounter (Signed)
Called patient to schedule Medicare Annual Wellness Visit (AWV). Left message for patient to call back and schedule Medicare Annual Wellness Visit (AWV).  Last date of AWV: No HX of AWV  Please schedule an AWV-I appointment at any time with [place].   Norton Blizzard, [place] (AAMA)  [facility] [phone]

## 2022-11-29 ENCOUNTER — Encounter: Payer: Self-pay | Admitting: Physical Therapy

## 2022-11-29 ENCOUNTER — Ambulatory Visit: Payer: Medicare HMO | Admitting: Physical Therapy

## 2022-11-29 DIAGNOSIS — M25661 Stiffness of right knee, not elsewhere classified: Secondary | ICD-10-CM

## 2022-11-29 DIAGNOSIS — M25551 Pain in right hip: Secondary | ICD-10-CM | POA: Diagnosis not present

## 2022-11-29 DIAGNOSIS — M25662 Stiffness of left knee, not elsewhere classified: Secondary | ICD-10-CM

## 2022-11-29 NOTE — Therapy (Signed)
OUTPATIENT PHYSICAL THERAPY LOWER EXTREMITY TREATMENT   Patient Name: [name] MRN: [id number] DOB:15-Feb-1951, 72 y.o., female Today's Date: 11/29/2022  END OF SESSION:  PT End of Session - 11/29/22 1331     Visit Number 9    Number of Visits 16    Date for PT Re-Evaluation 12/16/22    Authorization Type Aetna Medicare    PT Start Time 1303    PT Stop Time 1344    PT Time Calculation (min) 41 min    Activity Tolerance Patient tolerated treatment well    Behavior During Therapy [facility] for tasks assessed/performed                 Past Medical History:  Diagnosis Date   Aortic atherosclerosis ([place]) 11/01/2022   On 2022 CT abdomen.   Arthritis    R knee, hands    Back pain    Blood transfusion    1986- post-partial hysterectomy   Breast cancer ([place]) 2009   right lumpectomy and radiation   Cataract    Chicken pox    Colon cancer ([place]) 2003   per pt report, cancer found in a colon polyp in 2003, procedure done in [place].  did not require surgery, chemo etc.     Depression    Difficulty standing 09/08/2022   Due to hip pain mainly- she agreed to physical therapy if insurance will cover   Diverticular disease 11/01/2022   On 2022 CT abd   Diverticulitis    Edema of both ankles    Family history of breast cancer 10/24/2017   Gallbladder problem    Gastritis    GERD (gastroesophageal reflux disease)    recently taken off anti reflux tx   Hepatitis    Hep. A- 1978   Hiatal hernia    History of stomach ulcers    Hypertension    Joint pain    Lactose intolerance    Lower extremity edema 01/11/2022   Lupus ([place])    Multiple gastric ulcers    Palpitations    Personal history of radiation therapy 2009   Prediabetes 09/08/2022   Lab Results Component Value Date  HGBA1C 5.7 (H) 06/29/2021     Rheumatic fever    Rheumatoid arthritis ([place])    SLE (systemic lupus erythematosus related syndrome) ([place])    Urinary incontinence    UTI (urinary tract infection)     Past Surgical History:  Procedure Laterality Date   [place] & tubal ligation    BIOPSY  07/25/2018   Procedure: BIOPSY;  Surgeon: Milus Banister, MD;  Location: [facility] ENDOSCOPY;  Service: Endoscopy;;   BREAST EXCISIONAL BIOPSY Left 1998   BREAST LUMPECTOMY Bilateral    2009   BREAST SURGERY     R breast lumpectomy   CHOLECYSTECTOMY     1978- open    ESOPHAGOGASTRODUODENOSCOPY (EGD) WITH PROPOFOL N/A 07/25/2018   Procedure: ESOPHAGOGASTRODUODENOSCOPY (EGD) WITH PROPOFOL;  Surgeon: Milus Banister, MD;  Location: [place];  Service: Endoscopy;  Laterality: N/A;   REPLACEMENT TOTAL KNEE Left    TONSILLECTOMY     1954   TOTAL KNEE ARTHROPLASTY  08/30/2011   Procedure: TOTAL KNEE ARTHROPLASTY;  Surgeon: Kerin Salen;  Location: [place];  Service: Orthopedics;  Laterality: Right;  Right Total Knee Arthroplasty   TUBAL LIGATION     Patient Active Problem List   Diagnosis Date Noted   Elevated  amylase 11/22/2022   Acute pain of right shoulder 11/01/2022   Hand pain, left 11/01/2022   Hand pain, right 11/01/2022   Aortic atherosclerosis ([place]) 11/01/2022   Diverticular disease 11/01/2022   Upper abdominal pain 11/01/2022   Chronic idiopathic constipation 11/01/2022   DDD (degenerative disc disease), 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/2022   Primary localized osteoarthrosis of multiple sites 09/08/2022   Rheumatoid arthritis ([place]) 09/08/2022   GERD (gastroesophageal reflux disease) 09/08/2022   Prediabetes 09/08/2022   Difficulty standing 09/08/2022   Lower extremity edema 01/11/2022   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   Class 1 obesity due to excess calories with serious comorbidity and body mass index (BMI) of 32.0 to 32.9 in adult 02/17/2021   Psychophysiological insomnia 02/17/2021   Hyperlipidemia with target LDL less than 130 12/30/2020   LVH (left ventricular hypertrophy) due to hypertensive disease, without heart failure 12/30/2020    History of breast cancer 08/07/2018   Family history of coronary artery disease in father 08/07/2018   Family history of breast cancer 10/24/2017   Personal history of colon cancer 10/24/2017   Pain in joint 04/22/2009   Malignant neoplasm of upper-outer quadrant of right breast in female, estrogen receptor positive ([place]) 01/11/2008   Hiatal hernia 12/14/2007   DIVERTICULOSIS, COLON 12/14/2007   Essential hypertension 11/03/2007   Lupus ([place]) 11/03/2007   Flank pain 11/03/2007    PCP: Berniece Pap  REFERRING PROVIDER: Berniece Pap  REFERRING DIAG: Difficulty in walking   THERAPY DIAG:  Pain in right hip  Stiffness of right knee, not elsewhere classified  Stiffness of left knee, not elsewhere classified  Rationale for Evaluation and Treatment: Rehabilitation  ONSET DATE: 1 year ago.   SUBJECTIVE:   SUBJECTIVE STATEMENT:  11/29/2022 Pt states less pain in hip. No pain with sleeping, and notes minimal pain during the day.   Eval:Pt reports R hip pain about 1 year, can't sleep on R side. Can sleep on L comfortably. Also pain with Standing for long time, walking. Better with movement.  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  She does not use No AD. Does not live alone.  [id number]: hands more, is on new meds- not sure if working yet.  Rash at times, fevers, 3x/yr flares.  She also reports increased pain below sternum in abdomen, when bending over cleaning. She has hiatal hernia but has not previously had any symptoms from it.    PERTINENT HISTORY: Lupus, RA, bil TKA,  Previous breast CA,colon  PAIN:  Are you having pain? Yes: NPRS scale: up to 7 /10 Pain location: R hip, R low back Pain description: same Aggravating factors: Sleeping on R side, Standing  Relieving factors: rest  PRECAUTIONS: None  WEIGHT BEARING RESTRICTIONS: No  FALLS:  Has patient fallen in last 6 months? Yes. Number of falls 1 - yesterday, tripped  up step, fell into bush, did not hurt herself.    PLOF: Independent  PATIENT GOALS: Less pain in hip/back.   NEXT MD VISIT:   OBJECTIVE:   DIAGNOSTIC FINDINGS: Recent x-ray for Back and hip.   PATIENT SURVEYS: Foto: will do next session  FOTO:  COGNITION: Overall cognitive status: Within functional limits for tasks assessed     SENSATION: WFL  POSTURE:    PALPATION:  hypomobile bil knee, L>R  knee (flexion) ;  Very tender R gr trochanter, mild tenderness into R glute, soreness in R SI, minimal pain in back.    LOWER EXTREMITY ROM:  Active  ROM Right eval Left eval  Hip flexion wfl wfl  Hip extension Mod limitation Mod limitation  Hip abduction Mild limitation Mild limitation  Hip adduction    Hip internal rotation    Hip external rotation Mild limitation   Knee flexion 105 85  Knee extension 0 -5  Ankle dorsiflexion    Ankle plantarflexion    Ankle inversion    Ankle eversion     (Blank rows = not tested)  LOWER EXTREMITY MMT:  MMT Right eval Left eval  Hip flexion 4- 4-  Hip extension    Hip abduction 4- (pain) 4-  Hip adduction    Hip internal rotation    Hip external rotation    Knee flexion 4 4  Knee extension 4 4  Ankle dorsiflexion    Ankle plantarflexion    Ankle inversion    Ankle eversion     (Blank rows = not tested)  LOWER EXTREMITY SPECIAL TESTS:  Neg faber, fadir,   + tenderness at gr troch  FUNCTIONAL TESTS:  Sit to stand: in efficient mechanics, not using/bending knees , likely due to stiffness, increased hinging from back/hips.   GAIT: Distance walked: 100 Assistive device utilized: None Level of assistance: Complete Independence Comments: unremarkable   TODAY'S TREATMENT:                                                                                                                              DATE:   11/29/22: Therapeutic Exercise: Aerobic: Supine: SLR with TA, 2 x 10 bil;  heel slides on L with strap x 15;  Prone  HSC x 15 bil for knee ROM Seated:  Sit to stand x 10  Standing:   Marching, hip abd, 2 x 10 bil;  with focus on core and trunk stability and posture;  side stepping ft x 4 each;Bwd walking 15 ft x 4 ;   Step ups fwd 6 in, x 10 ea bil; no UE support; up /down 5 steps x 5 no UE support, reciprocal.  Stretches:   Neuromuscular Re-education: Manual Therapy:  Self Care:   11/26/22: Therapeutic Exercise: Aerobic: Supine: SLR with TA, 2 x 10 bil; heel slides on L with strap x 15;  Seated:  S/L hip abd 2 x 10 on R:  Standing:   Marching, hip abd, 2 x 10 bil;  with focus on core and trunk stability and posture;  side stepping ft x 4 each;  Step ups fwd 6 in, x 10 ea bil; no UE support; up /down 5 steps x 5 no UE support, reciprocal.  Stretches:   Neuromuscular Re-education: Manual Therapy:  Self Care:  11/17/22: Therapeutic Exercise: Aerobic: Supine: SLR with TA, 2 x 10 bil;  Seated: sit to stand x 10  higher mat table, no hands  S/L hip abd 2 x 10 on R:  Standing:   Marching, hip abd, 2 x 10 bil;  Hip ext bil 2 x  10;  all with focus on core and trunk stability and posture;  side stepping and bwd walking 25 ft x 4 each;  Stretches:   Neuromuscular Re-education: Manual Therapy:  Self Care:   11/15/22: Therapeutic Exercise: Aerobic: Supine:  SLR with TA, x 10 bil;   Seated: sit to stand x 10  higher mat table, no hands  S/L hip abd 2 x 10 on R:  Standing:   Marching, hip abd, 2 x 10 bil;  Hip ext x 10 bil;  all with focus on core and trunk stability and posture;   Step ups fwd 6 in, x 10 ea bil; 1 UE support;   Stretches:   Neuromuscular Re-education: Manual Therapy:  Therapeutic Activity:  Self Care:   PATIENT EDUCATION:  Education details: reviewed HEP Person educated: Patient Education method: Explanation, Demonstration, Tactile cues, Verbal cues, and Handouts Education comprehension: verbalized understanding, returned demonstration, verbal cues required, tactile cues  required, and needs further education  HOME EXERCISE PROGRAM: Access Code: [id number]  ASSESSMENT:  CLINICAL IMPRESSION: 11/29/2022  Pt progressing well. Likely d/c in next couple visits, if pain still doing well. She is improving with LE and hip strength and functional mobility.   OBJECTIVE IMPAIRMENTS: Abnormal gait, decreased activity tolerance, decreased mobility, difficulty walking, decreased ROM, decreased strength, hypomobility, impaired flexibility, improper body mechanics, and pain.   ACTIVITY LIMITATIONS: bending, sitting, standing, squatting, sleeping, stairs, transfers, and locomotion level  PARTICIPATION LIMITATIONS: meal prep, cleaning, laundry, community activity, and yard work  PERSONAL FACTORS: Past/current experiences Knee stiffness from previous TKA, are also affecting patient's functional outcome.   REHAB POTENTIAL: Good  CLINICAL DECISION MAKING: Stable/uncomplicated  EVALUATION COMPLEXITY: Low   GOALS: Goals reviewed with patient? Yes  SHORT TERM GOALS: Target date: 11/04/2022  Pt to be independent with initial HEP  Goal status: INITIAL  2. Pt to demo ability for optimal mechanics for sit to stand from higher mat table.   Goal status: INITIAL    LONG TERM GOALS: Target date: 12/16/2022  Pt to be independent with final HEP for best care for knees and hip/back.   Goal status: INITIAL  2.  Pt to report decreased pain in R hip to 0-3/10 at night and with standing activity , to improve ability for community activity   Goal status: INITIAL  3.  Pt to demo ability for sit to stand with minor use of hands from regular chair height , to improve ability for IADLs and work duties .  Goal status: INITIAL  4.  Pt to demo improved strength of bil hip abd to at least 4+/5 to improve stability , gait and pain.    Goal status: INITIAL    PLAN:  PT FREQUENCY: 2x/week  PT DURATION: 8 weeks  PLANNED INTERVENTIONS: Therapeutic exercises, Therapeutic  activity, Neuromuscular re-education, Balance training, Gait training, Patient/Family education, Self Care, Joint mobilization, Joint manipulation, DME instructions, Aquatic Therapy, Dry Needling, Electrical stimulation, Spinal manipulation, Spinal mobilization, Cryotherapy, Moist heat, Taping, Traction, Ultrasound, Ionotophoresis '4mg'$ /ml Dexamethasone, and Manual therapy  PLAN FOR NEXT SESSION:    Lyndee Hensen, PT, DPT 1:31 PM  11/29/22

## 2022-11-30 ENCOUNTER — Ambulatory Visit (INDEPENDENT_AMBULATORY_CARE_PROVIDER_SITE_OTHER): Payer: Medicare HMO | Admitting: Internal Medicine

## 2022-11-30 ENCOUNTER — Encounter: Payer: Self-pay | Admitting: Internal Medicine

## 2022-11-30 ENCOUNTER — Other Ambulatory Visit: Payer: Medicare HMO

## 2022-11-30 VITALS — BP 106/62 | HR 68 | Temp 97.5°F | Ht 67.0 in | Wt 183.2 lb

## 2022-11-30 DIAGNOSIS — Z23 Encounter for immunization: Secondary | ICD-10-CM | POA: Diagnosis not present

## 2022-11-30 DIAGNOSIS — R1084 Generalized abdominal pain: Secondary | ICD-10-CM

## 2022-11-30 DIAGNOSIS — R932 Abnormal findings on diagnostic imaging of liver and biliary tract: Secondary | ICD-10-CM | POA: Diagnosis not present

## 2022-11-30 DIAGNOSIS — R1010 Upper abdominal pain, unspecified: Secondary | ICD-10-CM

## 2022-11-30 MED ORDER — DICYCLOMINE HCL 10 MG PO CAPS: 10.0000 mg | ORAL_CAPSULE | Freq: Three times a day (TID) | ORAL | 1 refills | Status: DC

## 2022-11-30 MED ORDER — SUCRALFATE 1 G PO TABS: 1.0000 g | ORAL_TABLET | Freq: Three times a day (TID) | ORAL | 1 refills | Status: DC

## 2022-11-30 MED ORDER — HYDROCODONE-ACETAMINOPHEN 5-325 MG PO TABS: 1.0000 | ORAL_TABLET | Freq: Four times a day (QID) | ORAL | 0 refills | Status: DC | PRN

## 2022-11-30 NOTE — Assessment & Plan Note (Addendum)
Prednisone didn't help Plaquenil helps hands only not stomach Reviewed CT in detail with patient- no clear cause of the pain but in my medical opinion its suspicious for ulcer and gastroenteritis associated with that.  Will get h.pyloric check as soon as possible and soon follow up.  Pain is very bad, will treatment(s) with antispasmodic, hydrocodone, carafate for now and see back within week.  Go to ER if worsening but vitals signs stable and she doesn't seem dehydrated or fulminantly ill at this time Patient reports history of ulcer resolving with antibiotic(s) makes me suspect(s) helicobacter history might explain willl get stool test for recurrence Overall I do suspect(s) hpylori and ulcerative disease underlying pain with the disease likely located at the ge jxn, even though she says the pain everywhere in abdomen and radiating to back Gastrointestinal referred care pending she doesn't see til April No surgical referred care need apparent from CT abd  I reviewed the CT abdomen results with patient in detail and Patients chart review and interview were used to generate a prompt for artificial intelligence analysis ([employer] artificial intelligence) clinical decision support.  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 The pain is localized to the epigastric and upper abdomen, radiating to  the upper back, and is not associated with nausea, vomiting, diarrhea, constipation, or typical heartburn sensations. Recent CT imaging reveals several findings, including tiny sub-centimeter low-attenuation lesions in the liver, a small portal hepatic venous fistula, and focal nodular wall thickening at the gastroesophageal (GE) junction and gastric fundus, raising concern for gastric carcinoma. There is no evidence of pancreatic mass, ductal dilatation, pancreatitis, suspicious hepatic masses, biliary obstruction, diverticulitis, pathologically enlarged lymph nodes, acute vascular findings, suspicious bone lesions, or abnormalities in the adnexal regions.  Differential Diagnosis: 1. Gastric Carcinoma: Given the patient's age, history of cancer, and CT findings of focal nodular wall thickening at the GE junction and gastric fundus, gastric carcinoma is a primary concern. The absence of typical heartburn sensations and the specific location of pain further support this possibility. 2. Portal Hepatic Venous Fistula: While usually asymptomatic, the identified portal hepatic venous fistula could contribute to abdominal discomfort through altered hepatic hemodynamics, though it is less likely to be the primary cause of her symptoms. 3. Liver Lesions: The tiny sub-centimeter low-attenuation lesions in the liver, most likely representing cysts, are unlikely to be symptomatic but warrant monitoring given the patient's cancer history. 4. Chronic Conditions Exacerbation: The patient's extensive medical history, including lupus (SLE), could contribute to her symptoms through mechanisms like serositis or lupus enteritis, though the current presentation and imaging findings lean more towards a localized gastric issue.  Plan:  Gastric Carcinoma Concern: - Dx: Endoscopic evaluation with esophagogastroduodenoscopy (EGD) and targeted biopsies of the gastric fundus and GE junction to assess for malignancy. Consider PET-CT  to evaluate for metastatic disease if carcinoma is confirmed. - Tx: Pending biopsy results. If gastric carcinoma is confirmed, referral to oncology for staging and multidisciplinary planning, including potential surgical resection, chemotherapy, and/or radiation therapy, as appropriate.  Portal Hepatic Venous Fistula: - Dx:  Hepatology consultation for further evaluation of the portal hepatic venous fistula's clinical significance and potential need for intervention. - Tx: Management is typically conservative unless there are complications or significant hemodynamic effects.  Liver Lesions Monitoring: - Dx: Follow-up imaging with MRI or ultrasound in 3-6 months to monitor the liver lesions' stability. - Tx: No specific treatment required for cysts; however, vigilance for changes in size or character is essential given the patient's oncologic history.  Chronic Conditions Management: - Dx: Review current management of lupus (SLE), hyperlipidemia, and hypertension to ensure optimal control. Reassess medications for potential gastrointestinal side effects. - Tx: Continue management of chronic conditions with adjustments as necessary based on recent evaluations. For lupus, ensure current therapy is effective in controlling systemic inflammation.  Monitoring and Follow-Up: - Schedule follow-up appointments for the review of biopsy results, hepatology and oncology consultations, and monitoring of chronic conditions. Emphasize the importance of reporting new or worsening symptoms, particularly those related to potential complications of identified conditions or treatments.  This comprehensive approach addresses the primary concern of possible gastric carcinoma while considering the patient's complex medical history and the need for a multidisciplinary approach to her care.

## 2022-11-30 NOTE — Progress Notes (Signed)
[name]: [id number]   Routine Medical Office Visit  Patient:  [name]      Age: 72 y.o.       Sex:  female  Date:   11/30/2022  PCP:    Loralee Pacas, [place] Provider: Loralee Pacas, MD   Assessment and Plan:   [name] was seen today for 1 month follow-up and chronic abdominal pain.  Generalized abdominal pain -     Dicyclomine HCl; Take 1 capsule (10 mg total) by mouth 4 (four) times daily -  before meals and at bedtime.  Dispense: 60 capsule; Refill: 1 -     Sucralfate; Take 1 tablet (1 g total) by mouth 4 (four) times daily -  with meals and at bedtime.  Dispense: 30 tablet; Refill: 1 -     H. pylori antigen, stool -     HYDROcodone-Acetaminophen; Take 1 tablet by mouth every 6 (six) hours as needed for moderate pain or severe pain.  Dispense: 20 tablet; Refill: 0  Need for shingles vaccine  Upper abdominal pain Overview: Radiates to the back and lower abdomen.has been present since late 2023 and we do not have a CAT scan yet for it.  X-ray abdomen showed only degenerative disc disease and chronic constipation.    Assessment & Plan:  Prednisone didn't help Plaquenil helps hands only not stomach Reviewed CT in detail with patient- no clear cause of the pain but in my medical opinion its suspicious for ulcer and gastroenteritis associated with that.  Will get h.pyloric check as soon as possible and soon follow up.  Pain is very bad, will treatment(s) with antispasmodic, hydrocodone, carafate for now and see back within week.  Go to ER if worsening but vitals signs stable and she doesn't seem dehydrated or fulminantly ill at this time Patient reports history of ulcer resolving with antibiotic(s) makes me suspect(s) helicobacter history might explain willl get stool test for recurrence Overall I do suspect(s) hpylori and ulcerative disease underlying pain with the disease likely located at the ge jxn, even though she says  the pain everywhere in abdomen and radiating to back Gastrointestinal referred care pending she doesn't see til April No surgical referred care need apparent from CT abd  I reviewed the CT abdomen results with patient in detail and Patients chart review and interview were used to generate a prompt for artificial intelligence analysis ([employer] artificial intelligence) clinical decision support.  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 The pain is localized to the epigastric and upper abdomen, radiating to the upper back, and is not associated with nausea, vomiting, diarrhea, constipation, or typical heartburn sensations. Recent CT imaging reveals several findings, including tiny sub-centimeter low-attenuation lesions in the liver, a small portal hepatic venous fistula, and focal nodular wall thickening at the gastroesophageal (GE) junction and gastric fundus, raising concern for gastric carcinoma. There is no evidence of pancreatic mass, ductal dilatation, pancreatitis, suspicious hepatic masses, biliary obstruction, diverticulitis, pathologically enlarged lymph nodes, acute vascular findings, suspicious bone lesions,  or abnormalities in the adnexal regions.  Differential Diagnosis: 1. Gastric Carcinoma: Given the patient's age, history of cancer, and CT findings of focal nodular wall  thickening at the GE junction and gastric fundus, gastric carcinoma is a primary concern. The absence of typical heartburn sensations and the specific location of pain further support this possibility. 2. Portal Hepatic Venous Fistula: While usually asymptomatic, the identified portal hepatic venous fistula could contribute to abdominal discomfort through altered hepatic hemodynamics, though it is less likely to be the primary cause of her symptoms. 3. Liver Lesions: The tiny sub-centimeter low-attenuation lesions in the liver, most likely representing cysts, are unlikely to be symptomatic but warrant monitoring given the patient's cancer history. 4. Chronic Conditions Exacerbation: The patient's extensive medical history, including lupus (SLE), could contribute to her symptoms through mechanisms like serositis or lupus enteritis, though the current presentation and imaging findings lean more towards a localized gastric issue.  Plan:  Gastric Carcinoma Concern: - Dx: Endoscopic evaluation with esophagogastroduodenoscopy (EGD) and targeted biopsies of the gastric fundus and GE junction to assess for malignancy. Consider PET-CT to evaluate for metastatic disease if carcinoma is confirmed. - Tx: Pending biopsy results. If gastric carcinoma is confirmed, referral to oncology for staging and multidisciplinary planning, including potential surgical resection, chemotherapy, and/or radiation therapy, as appropriate.  Portal Hepatic Venous Fistula: - Dx: Hepatology consultation for further evaluation of the portal hepatic venous fistula's clinical significance and potential need for intervention. - Tx: Management is typically conservative unless there are complications or significant hemodynamic effects.  Liver Lesions Monitoring: - Dx: Follow-up imaging with MRI or ultrasound in 3-6 months to monitor the liver lesions' stability. - Tx: No specific treatment required for cysts; however, vigilance for changes  in size or character is essential given the patient's oncologic history.  Chronic Conditions Management: - Dx: Review current management of lupus (SLE), hyperlipidemia, and hypertension to ensure optimal control. Reassess medications for potential gastrointestinal side effects. - Tx: Continue management of chronic conditions with adjustments as necessary based on recent evaluations. For lupus, ensure current therapy is effective in controlling systemic inflammation.  Monitoring and Follow-Up: - Schedule follow-up appointments for the review of biopsy results, hepatology and oncology consultations, and monitoring of chronic conditions. Emphasize the importance of reporting new or worsening symptoms, particularly those related to potential complications of identified conditions or treatments.  This comprehensive approach addresses the primary concern of possible gastric carcinoma while considering the patient's complex medical history and the need for a multidisciplinary approach to her care.     Abnormal finding on imaging of liver -     Amb Referral to Hepatology       Clinical Presentation:   The patient is a 72 y.o. female: Active Ambulatory Problems    Diagnosis Date Noted   Malignant neoplasm of upper-outer quadrant of right breast in female, estrogen receptor positive ([place]) 01/11/2008   Essential hypertension 11/03/2007   Hiatal hernia 12/14/2007   DIVERTICULOSIS, COLON 12/14/2007   Lupus ([place]) 11/03/2007   Pain in joint 04/22/2009   Flank pain 11/03/2007   Family history of breast cancer 10/24/2017   Personal history of colon cancer 10/24/2017   History of breast cancer 08/07/2018   Family history of coronary artery disease in father 08/07/2018   Hyperlipidemia with target LDL less than 130 12/30/2020   LVH (left ventricular hypertrophy) due to hypertensive disease, without heart failure 12/30/2020   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   Class 1 obesity due to  excess calories  with serious comorbidity and body mass index (BMI) of 32.0 to 32.9 in adult 02/17/2021   Psychophysiological insomnia 02/17/2021   Lower extremity edema 01/11/2022   Primary localized osteoarthrosis of multiple sites 09/08/2022   Rheumatoid arthritis ([address]) 09/08/2022   GERD (gastroesophageal reflux disease) 09/08/2022   Prediabetes 09/08/2022   Difficulty standing 09/08/2022   Acute pain of right shoulder 11/01/2022   Hand pain, left 11/01/2022   Hand pain, right 11/01/2022   Aortic atherosclerosis ([place]) 11/01/2022   Diverticular disease 11/01/2022   Upper abdominal pain 11/01/2022   Chronic idiopathic constipation 11/01/2022   DDD (degenerative disc disease), 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/2022   Elevated amylase 11/22/2022   Abnormal finding on imaging of liver 11/30/2022   Resolved Ambulatory Problems    Diagnosis Date Noted   NEUROPATHY 09/04/2009   CERUMEN IMPACTION, RIGHT 12/03/2009   OTITIS MEDIA, RIGHT 01/22/2010   HIP PAIN, RIGHT 11/03/2007   FATIGUE 09/04/2009   Cough 09/04/2009   Dysuria 04/22/2009   MAMMOGRAM, ABNORMAL, RIGHT 12/14/2007   Osteoarthritis of right knee 08/29/2011   Chest pain 10/05/2014   Hypokalemia 10/05/2014   Genetic testing 11/24/2017   Syncope and collapse 07/23/2018   Abdominal pain, epigastric    Gastritis and gastroduodenitis    Deficiency anemia 12/30/2020   Encounter for general adult medical examination with abnormal findings 12/30/2020   Past Medical History:  Diagnosis Date   Arthritis    Back pain    Blood transfusion    Breast cancer ([place]) 2009   Cataract    Chicken pox    Colon cancer ([place]) 2003   Depression    Diverticulitis    Edema of both ankles    Gallbladder problem    Gastritis    Hepatitis    History of stomach ulcers    Hypertension    Joint pain    Lactose intolerance    Multiple gastric ulcers    Palpitations    Personal history of radiation therapy 2009   Rheumatic fever    SLE (systemic lupus  erythematosus related syndrome) (HCC)    Urinary incontinence    UTI (urinary tract infection)     Outpatient Medications Prior to Visit  Medication Sig   [employer] Fiber Gummy Bears Children CHEW Chew 20 g by mouth daily at 6 (six) AM.   hydrochlorothiazide (HYDRODIURIL) 25 MG tablet Take 1 tablet (25 mg total) by mouth daily.   hydroxychloroquine (PLAQUENIL) 200 MG tablet Take 200 mg by mouth 2 (two) times daily.   ibuprofen (ADVIL) 800 MG tablet Take 800 mg by mouth every 8 (eight) hours as needed.   losartan (COZAAR) 25 MG tablet Take 1 tablet (25 mg total) by mouth daily.   metoprolol succinate (TOPROL-XL) 50 MG 24 hr tablet Take 1 tablet (50 mg total) by mouth daily.   Multiple Vitamins-Minerals (EMERGEN-C IMMUNE PLUS PO) Take 1 tablet by mouth daily.   omeprazole (PRILOSEC) 40 MG capsule Take 1 capsule (40 mg total) by mouth daily.   potassium chloride (KLOR-CON) 10 MEQ tablet Take 1 tablet (10 mEq total) by mouth daily.   rosuvastatin (CRESTOR) 40 MG tablet Take 1 tablet (40 mg total) by mouth daily.   [DISCONTINUED] cholecalciferol 25 MCG (1000 UT) tablet Take by mouth.   [DISCONTINUED] [employer] ALLERGY RELIEF 25 MG capsule PLEASE SEE ATTACHED FOR DETAILED DIRECTIONS   [DISCONTINUED] cyclobenzaprine (FLEXERIL) 5 MG tablet Take by mouth.   [DISCONTINUED] diphenhydrAMINE (BENADRYL ALLERGY) 25 MG tablet Take 1 tablet (25 mg total) by mouth every  6 (six) hours as needed (take as directed). Take 50 mg (2 tablets 1 hour prior to procedure) take 1 extra tablet every 4 hours after that as needed for itching swelling or other allergic issues. Go to ER if any shortness of breath.   [DISCONTINUED] diphenhydrAMINE (BENADRYL) 50 MG capsule Take on date/time instructed by prescribing provider. (optional) diphenhydrAMINE (BENADRYL) 50 mg IV/PO within 1 hour of contrast injection Call [phone] for instructions if needed.   No facility-administered medications prior to visit.     Chief Complaint  Patient  presents with   1 month follow-up   chronic abdominal pain    HPI  Here mainly to review abnormal CT findings and develop plan Also she complains pain just constant and getting worse and so severe now, would like norco Today she mentions history treatment with antibiotic(s) in past for infection stomach ulcer.          Clinical Data Analysis:  Physical Exam  BP 106/62 (BP Location: Left Arm, Patient Position: Sitting)   Pulse 68   Temp (!) 97.5 F (36.4 C) (Temporal)   Ht '5\' 7"'$  (1.702 m)   Wt 183 lb 3.2 oz (83.1 kg)   SpO2 93%   BMI 28.69 kg/m  Wt Readings from Last 10 Encounters:  11/30/22 183 lb 3.2 oz (83.1 kg)  11/22/22 186 lb 3.2 oz (84.5 kg)  11/01/22 180 lb 3.2 oz (81.7 kg)  09/08/22 186 lb 6.4 oz (84.6 kg)  08/16/22 185 lb 3.2 oz (84 kg)  06/29/22 187 lb 1.6 oz (84.9 kg)  06/04/22 180 lb (81.6 kg)  04/12/22 188 lb (85.3 kg)  01/11/22 188 lb 1.6 oz (85.3 kg)  01/03/22 187 lb 6.4 oz (85 kg)   BP Readings from Last 3 Encounters:  11/30/22 106/62  11/22/22 124/60  11/01/22 130/88    Vital signs reviewed.  Nursing notes reviewed. Weight trend reviewed. Abnormalities noted: weight stable, oxygen low normal, temperature false low in most of patients, she is not chilling. Blood pressure slightly on lower side for her but she doesn't seem dehyderated. BMI is an unreliable indicator of healthy body composition due to its inability to reflect lean muscle mass.  General Appearance:  Well developed, well nourished female in no acute distress.   Pulmonary:  Normal work of breathing at rest, no respiratory distress apparent. SpO2: 93 %  Musculoskeletal: All extremities are intact.  Neurological:  Awake, alert. No obvious focal neurological deficits or cognitive impairments.  Sensorium seems unclouded. Psychiatric:  Appropriate mood, pleasant demeanor Problem-specific findings:  able to move around without obvious grimacing.  No stents or flaring of the pain obvious.. She may  just be very stoic because she reports the pain is severe and goes from upper abdomen down to lower and out around the flanks constantly and she is not a complainer.   Results Reviewed: (reviewed labs/imaging may be also be found in the assessment / plan section):     No results found for any visits on 11/30/22.  Recent Results (from the past 2160 hour(s))  CBC     Status: None   Collection Time: 09/08/22 12:37 PM  Result Value Ref Range   WBC 4.4 4.0 - 10.5 K/uL   RBC 4.16 3.87 - 5.11 Mil/uL   Platelets 214.0 150.0 - 400.0 K/uL   Hemoglobin 12.9 12.0 - 15.0 g/dL   HCT 39.1 36.0 - 46.0 %   MCV 93.9 78.0 - 100.0 fl   MCHC 32.9 30.0 - 36.0 g/dL  RDW 13.9 11.5 - 15.5 %  Comp Met (CMET)     Status: None   Collection Time: 09/08/22 12:37 PM  Result Value Ref Range   Sodium 139 135 - 145 mEq/L   Potassium 4.0 3.5 - 5.1 mEq/L   Chloride 104 96 - 112 mEq/L   CO2 28 19 - 32 mEq/L   Glucose, Bld 86 70 - 99 mg/dL   BUN 12 6 - 23 mg/dL   Creatinine, Ser 0.69 0.40 - 1.20 mg/dL   Total Bilirubin 0.5 0.2 - 1.2 mg/dL   Alkaline Phosphatase 63 39 - 117 U/L   AST 26 0 - 37 U/L   ALT 20 0 - 35 U/L   Total Protein 7.2 6.0 - 8.3 g/dL   Albumin 4.0 3.5 - 5.2 g/dL   GFR 86.01 >60.00 mL/min    Comment: Calculated using the CKD-EPI Creatinine Equation (2021)   Calcium 10.0 8.4 - 10.5 mg/dL  Urinalysis, Routine w reflex microscopic     Status: Abnormal   Collection Time: 09/08/22 12:37 PM  Result Value Ref Range   Color, Urine YELLOW Yellow;Lt. Yellow;Straw;Dark Yellow;Amber;Green;Red;Brown   APPearance CLEAR Clear;Turbid;Slightly Cloudy;Cloudy   Specific Gravity, Urine 1.015 1.000 - 1.030   pH 6.5 5.0 - 8.0   Total Protein, Urine NEGATIVE Negative   Urine Glucose NEGATIVE Negative   Ketones, ur NEGATIVE Negative   Bilirubin Urine NEGATIVE Negative   Hgb urine dipstick NEGATIVE Negative   Urobilinogen, UA 1.0 0.0 - 1.0   Leukocytes,Ua NEGATIVE Negative   Nitrite NEGATIVE Negative   WBC,  UA 0-2/hpf 0-2/hpf   RBC / HPF 0-2/hpf 0-2/hpf   Squamous Epithelial / HPF Rare(0-4/hpf) Rare(0-4/hpf)   Renal Epithel, UA None Seen None   Bacteria, UA Rare(<10/hpf) (A) None  HgB A1c     Status: None   Collection Time: 09/08/22 12:37 PM  Result Value Ref Range   Hgb A1c MFr Bld 5.6 4.6 - 6.5 %    Comment: Glycemic Control Guidelines for People with Diabetes:Non Diabetic:  <6%Goal of Therapy: <7%Additional Action Suggested:  >8%   Amylase     Status: Abnormal   Collection Time: 11/15/22 12:02 PM  Result Value Ref Range   Amylase 125 (H) 31 - 110 U/L  Lipase     Status: None   Collection Time: 11/15/22 12:02 PM  Result Value Ref Range   Lipase 30 14 - 85 U/L    No image results found.        Signed: Loralee Pacas, MD 11/30/2022 8:45 PM

## 2022-12-01 ENCOUNTER — Encounter: Payer: Medicare HMO | Admitting: Physical Therapy

## 2022-12-01 NOTE — Therapy (Signed)
error 

## 2022-12-06 ENCOUNTER — Encounter: Payer: Self-pay | Admitting: Physical Therapy

## 2022-12-06 ENCOUNTER — Ambulatory Visit: Payer: Medicare HMO | Admitting: Physical Therapy

## 2022-12-06 DIAGNOSIS — M25662 Stiffness of left knee, not elsewhere classified: Secondary | ICD-10-CM

## 2022-12-06 DIAGNOSIS — M25551 Pain in right hip: Secondary | ICD-10-CM

## 2022-12-06 DIAGNOSIS — M5459 Other low back pain: Secondary | ICD-10-CM

## 2022-12-06 DIAGNOSIS — M25661 Stiffness of right knee, not elsewhere classified: Secondary | ICD-10-CM

## 2022-12-06 NOTE — Therapy (Signed)
OUTPATIENT PHYSICAL THERAPY LOWER EXTREMITY TREATMENT/ PN/ Discharge   Patient Name: [name] MRN: [id number] DOB:1951/04/08, 72 y.o., female Today's Date: 12/06/2022  Progress note: 10/21/22   to  12/06/22  END OF SESSION:  PT End of Session - 12/06/22 0936     Visit Number 10    Number of Visits 16    Date for PT Re-Evaluation 12/16/22    Authorization Type [employer]    PT Start Time [phone]    PT Stop Time 1020    PT Time Calculation (min) 43 min    Activity Tolerance Patient tolerated treatment well    Behavior During Therapy [facility] for tasks assessed/performed                 Past Medical History:  Diagnosis Date   Aortic atherosclerosis ([place]) 11/01/2022   On 2022 CT abdomen.   Arthritis    R knee, hands    Back pain    Blood transfusion    1986- post-partial hysterectomy   Breast cancer ([place]) 2009   right lumpectomy and radiation   Cataract    Chicken pox    Colon cancer ([place]) 2003   per pt report, cancer found in a colon polyp in 2003, procedure done in [place].  did not require surgery, chemo etc.     Depression    Difficulty standing 09/08/2022   Due to hip pain mainly- she agreed to physical therapy if insurance will cover   Diverticular disease 11/01/2022   On 2022 CT abd   Diverticulitis    Edema of both ankles    Family history of breast cancer 10/24/2017   Gallbladder problem    Gastritis    GERD (gastroesophageal reflux disease)    recently taken off anti reflux tx   Hepatitis    Hep. A- 1978   Hiatal hernia    History of stomach ulcers    Hypertension    Joint pain    Lactose intolerance    Lower extremity edema 01/11/2022   Lupus ([place])    Multiple gastric ulcers    Palpitations    Personal history of radiation therapy 2009   Prediabetes 09/08/2022   Lab Results Component Value Date  HGBA1C 5.7 (H) 06/29/2021     Rheumatic fever    Rheumatoid arthritis ([place])    SLE (systemic lupus erythematosus related syndrome) ([place])     Urinary incontinence    UTI (urinary tract infection)    Past Surgical History:  Procedure Laterality Date   [place] & tubal ligation    BIOPSY  07/25/2018   Procedure: BIOPSY;  Surgeon: Milus Banister, MD;  Location: [facility] ENDOSCOPY;  Service: Endoscopy;;   BREAST EXCISIONAL BIOPSY Left 1998   BREAST LUMPECTOMY Bilateral    2009   BREAST SURGERY     R breast lumpectomy   CHOLECYSTECTOMY     1978- open    ESOPHAGOGASTRODUODENOSCOPY (EGD) WITH PROPOFOL N/A 07/25/2018   Procedure: ESOPHAGOGASTRODUODENOSCOPY (EGD) WITH PROPOFOL;  Surgeon: Milus Banister, MD;  Location: [facility];  Service: Endoscopy;  Laterality: N/A;   REPLACEMENT TOTAL KNEE Left    TONSILLECTOMY     1954   TOTAL KNEE ARTHROPLASTY  08/30/2011   Procedure: TOTAL KNEE ARTHROPLASTY;  Surgeon: Kerin Salen;  Location: [place];  Service: Orthopedics;  Laterality: Right;  Right Total Knee Arthroplasty   TUBAL LIGATION     Patient  Active Problem List   Diagnosis Date Noted   Abnormal finding on imaging of liver 11/30/2022   Elevated amylase 11/22/2022   Acute pain of right shoulder 11/01/2022   Hand pain, left 11/01/2022   Hand pain, right 11/01/2022   Aortic atherosclerosis ([place]) 11/01/2022   Diverticular disease 11/01/2022   Upper abdominal pain 11/01/2022   Chronic idiopathic constipation 11/01/2022   DDD (degenerative disc disease), 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/2022   Primary localized osteoarthrosis of multiple sites 09/08/2022   Rheumatoid arthritis ([place]) 09/08/2022   GERD (gastroesophageal reflux disease) 09/08/2022   Prediabetes 09/08/2022   Difficulty standing 09/08/2022   Lower extremity edema 01/11/2022   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   Class 1 obesity due to excess calories with serious comorbidity and body mass index (BMI) of 32.0 to 32.9 in adult 02/17/2021   Psychophysiological insomnia 02/17/2021   Hyperlipidemia with target LDL less than 130  12/30/2020   LVH (left ventricular hypertrophy) due to hypertensive disease, without heart failure 12/30/2020   History of breast cancer 08/07/2018   Family history of coronary artery disease in father 08/07/2018   Family history of breast cancer 10/24/2017   Personal history of colon cancer 10/24/2017   Pain in joint 04/22/2009   Malignant neoplasm of upper-outer quadrant of right breast in female, estrogen receptor positive ([place]) 01/11/2008   Hiatal hernia 12/14/2007   DIVERTICULOSIS, COLON 12/14/2007   Essential hypertension 11/03/2007   Lupus ([place]) 11/03/2007   Flank pain 11/03/2007    PCP: Berniece Pap  REFERRING PROVIDER: Berniece Pap  REFERRING DIAG: Difficulty in walking   THERAPY DIAG:  Pain in right hip  Stiffness of right knee, not elsewhere classified  Stiffness of left knee, not elsewhere classified  Other low back pain  Rationale for Evaluation and Treatment: Rehabilitation  ONSET DATE: 1 year ago.   SUBJECTIVE:   SUBJECTIVE STATEMENT:  12/06/2022 Pt states less pain in hip. No pain with sleeping or activity. She is still having stomach pain, not seeing GI until 4/16.   Eval:Pt reports R hip pain about 1 year, can't sleep on R side. Can sleep on L comfortably. Also pain with Standing for long time, walking. Better with movement.  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  She does not use No AD. Does not live alone.  [id number]: hands more, is on new meds- not sure if working yet.  Rash at times, fevers, 3x/yr flares.  She also reports increased pain below sternum in abdomen, when bending over cleaning. She has hiatal hernia but has not previously had any symptoms from it.    PERTINENT HISTORY: Lupus, RA, bil TKA,  Previous breast CA,colon  PAIN:  Are you having pain? Yes: NPRS scale: up to 7 /10 Pain location: R hip, R low back Pain description: same Aggravating factors: Sleeping on R side, Standing   Relieving factors: rest  PRECAUTIONS: None  WEIGHT BEARING RESTRICTIONS: No  FALLS:  Has patient fallen in last 6 months? Yes. Number of falls 1 - yesterday, tripped up step, fell into bush, did not hurt herself.    PLOF: Independent  PATIENT GOALS: Less pain in hip/back.   NEXT MD VISIT:   OBJECTIVE:   DIAGNOSTIC FINDINGS: Recent x-ray for Back and hip.   PATIENT SURVEYS: Foto: will do next session  FOTO:  COGNITION: Overall cognitive status: Within functional limits for tasks assessed     SENSATION: WFL  POSTURE:    PALPATION:  hypomobile bil knee, L>R  knee (flexion) ;  Very tender R gr trochanter, mild tenderness into R glute, soreness in R SI, minimal pain in back.    LOWER EXTREMITY ROM:  Active ROM Right eval Left eval 12/06/22 L  Hip flexion wfl wfl   Hip extension Mod limitation Mod limitation   Hip abduction Mild limitation Mild limitation   Hip adduction     Hip internal rotation     Hip external rotation Mild limitation    Knee flexion 105 85 87  Knee extension 0 -5 -3  Ankle dorsiflexion     Ankle plantarflexion     Ankle inversion     Ankle eversion      (Blank rows = not tested)  LOWER EXTREMITY MMT:  MMT Right eval Left eval R/L  Hip flexion 4- 4- 4+/4+  Hip extension     Hip abduction 4- (pain) 4- 4/4  Hip adduction     Hip internal rotation     Hip external rotation     Knee flexion 4 4 5/4+  Knee extension 4 4 5/4+  Ankle dorsiflexion     Ankle plantarflexion     Ankle inversion     Ankle eversion      (Blank rows = not tested)   TODAY'S TREATMENT:                                                                                                                              DATE:   12/06/22: Therapeutic Exercise: Aerobic: Supine: SLR with TA,  x 10 bil;  heel slides on L with strap x 15;  Supine march x 10;  TA with education and practice on achieving active contraction x 10;  Seated:  Sit to stand x 5; LAQ 3lb x 20  bil;  Seated flexion ROM for L knee x 15 ;   Standing:   Marching, hip abd, 2 x 10 bil;  with focus on core and trunk stability and posture;   up /down 5 steps x 2 no UE support, reciprocal.  Stretches:   Neuromuscular Re-education: Manual Therapy:  Self Care:   PATIENT EDUCATION:  Education details: reviewed HEP Person educated: Patient Education method: Consulting civil engineer, Demonstration, Tactile cues, Verbal cues, and Handouts Education comprehension: verbalized understanding, returned demonstration, verbal cues required, tactile cues required, and needs further education  HOME EXERCISE PROGRAM: Access Code: [email or web address]  ASSESSMENT:  CLINICAL IMPRESSION: 12/06/2022  Pt has made good progress. She has had good resolution of hip pain. Continued education on importance of continued mobility and strengthening for hips, knees, and core, as she does have ROM limitations In knees from previous surgery, with tendency for compensation from this. Final HEP reviewed in detail today, pt with good understanding. She will continue to follow up with MD and GI for stomach pain.   OBJECTIVE IMPAIRMENTS: Abnormal gait, decreased activity tolerance, decreased mobility, difficulty walking, decreased ROM, decreased strength, hypomobility, impaired flexibility, improper body mechanics, and pain.  ACTIVITY LIMITATIONS: bending, sitting, standing, squatting, sleeping, stairs, transfers, and locomotion level  PARTICIPATION LIMITATIONS: meal prep, cleaning, laundry, community activity, and yard work  PERSONAL FACTORS: Past/current experiences Knee stiffness from previous TKA, are also affecting patient's functional outcome.   REHAB POTENTIAL: Good  CLINICAL DECISION MAKING: Stable/uncomplicated  EVALUATION COMPLEXITY: Low   GOALS: Goals reviewed with patient? Yes  SHORT TERM GOALS: Target date: 11/04/2022  Pt to be independent with initial HEP  Goal status: MET  2. Pt to demo ability for optimal  mechanics for sit to stand from higher mat table.   Goal status: MET    LONG TERM GOALS: Target date: 12/16/2022  Pt to be independent with final HEP for best care for knees and hip/back.   Goal status: MET  2.  Pt to report decreased pain in R hip to 0-3/10 at night and with standing activity , to improve ability for community activity   Goal status:MET  3.  Pt to demo ability for sit to stand with minor use of hands from regular chair height , to improve ability for IADLs and work duties .  Goal status: MET  4.  Pt to demo improved strength of bil hip abd to at least 4+/5 to improve stability , gait and pain.    Goal status: MET   PLAN:  PT FREQUENCY: 2x/week  PT DURATION: 8 weeks  PLANNED INTERVENTIONS: Therapeutic exercises, Therapeutic activity, Neuromuscular re-education, Balance training, Gait training, Patient/Family education, Self Care, Joint mobilization, Joint manipulation, DME instructions, Aquatic Therapy, Dry Needling, Electrical stimulation, Spinal manipulation, Spinal mobilization, Cryotherapy, Moist heat, Taping, Traction, Ultrasound, Ionotophoresis 4mg /ml Dexamethasone, and Manual therapy  PLAN FOR NEXT SESSION:   Lyndee Hensen, PT, DPT 9:36 AM  12/06/22  PHYSICAL THERAPY DISCHARGE SUMMARY  Visits from Start of Care: 10 Plan: Patient agrees to discharge.  Patient goals were met. Patient is being discharged due to meeting the stated rehab goals.     Lyndee Hensen, PT, DPT 10:28 AM  12/06/22

## 2022-12-07 ENCOUNTER — Ambulatory Visit: Payer: Medicare HMO | Admitting: Internal Medicine

## 2022-12-08 ENCOUNTER — Encounter: Payer: Medicare HMO | Admitting: Physical Therapy

## 2022-12-08 ENCOUNTER — Other Ambulatory Visit: Payer: Self-pay | Admitting: Internal Medicine

## 2022-12-08 DIAGNOSIS — R1084 Generalized abdominal pain: Secondary | ICD-10-CM

## 2022-12-09 NOTE — Telephone Encounter (Signed)
Spoke with patient and she confirmed that she picked this prescription up last week.

## 2022-12-13 ENCOUNTER — Other Ambulatory Visit: Payer: Medicare HMO

## 2022-12-13 DIAGNOSIS — R1084 Generalized abdominal pain: Secondary | ICD-10-CM | POA: Diagnosis not present

## 2022-12-15 ENCOUNTER — Ambulatory Visit (INDEPENDENT_AMBULATORY_CARE_PROVIDER_SITE_OTHER): Payer: Medicare HMO | Admitting: Internal Medicine

## 2022-12-15 ENCOUNTER — Encounter: Payer: Self-pay | Admitting: Internal Medicine

## 2022-12-15 VITALS — BP 131/75 | HR 57 | Temp 97.9°F | Ht 67.0 in | Wt 183.4 lb

## 2022-12-15 DIAGNOSIS — R932 Abnormal findings on diagnostic imaging of liver and biliary tract: Secondary | ICD-10-CM | POA: Diagnosis not present

## 2022-12-15 DIAGNOSIS — H00012 Hordeolum externum right lower eyelid: Secondary | ICD-10-CM

## 2022-12-15 DIAGNOSIS — R1084 Generalized abdominal pain: Secondary | ICD-10-CM

## 2022-12-15 DIAGNOSIS — Z23 Encounter for immunization: Secondary | ICD-10-CM

## 2022-12-15 DIAGNOSIS — R109 Unspecified abdominal pain: Secondary | ICD-10-CM | POA: Diagnosis not present

## 2022-12-15 DIAGNOSIS — K3189 Other diseases of stomach and duodenum: Secondary | ICD-10-CM | POA: Insufficient documentation

## 2022-12-15 DIAGNOSIS — R1010 Upper abdominal pain, unspecified: Secondary | ICD-10-CM

## 2022-12-15 HISTORY — DX: Unspecified abdominal pain: R10.9

## 2022-12-15 HISTORY — DX: Other diseases of stomach and duodenum: K31.89

## 2022-12-15 HISTORY — DX: Generalized abdominal pain: R10.84

## 2022-12-15 LAB — H. PYLORI ANTIGEN, STOOL: H pylori Ag, Stl: NEGATIVE

## 2022-12-15 MED ORDER — OMEPRAZOLE 40 MG PO CPDR: 40.0000 mg | DELAYED_RELEASE_CAPSULE | Freq: Every day | ORAL | 5 refills | Status: DC

## 2022-12-15 MED ORDER — HYDROCODONE-ACETAMINOPHEN 5-325 MG PO TABS: 1.0000 | ORAL_TABLET | Freq: Four times a day (QID) | ORAL | 0 refills | Status: DC | PRN

## 2022-12-15 NOTE — Assessment & Plan Note (Addendum)
Asked certified medical assistant to fax this note to her rheumatologist, but I agree with patient reports that this seems unrelated to lupus I believe she needs ugi evaluation and esophagoduodenoscopy- referred care to gastroenterology won't happen to 01/04/23 she will schedule to see me soon after. In meantime I re-reviewed CT scan and don't see any  explanation for pain, and will give more pain medications which are helping. Also confirm gastrointestinal prophylaxis it seems like due to misunderstanding with pharmacist she is not taking omeprazole or carafate as prescribed, advised patient to go ahead and start them, but agree with pharmacist to offset the starts so if there is a problem we know which medication(s) to blame. AVS handout developed: Very important to stop ibuprofen.  It causes ulcers. I think you have a stress ulcer although this is not confirmed and I could be completely wrong.  Regardless I think we would benefit from treating it as if you are going to get a stress ulcer from the problem and so I want you to go ahead and start Carafate and omeprazole for now and give it some time.  At least a couple weeks.  More omeprazole will come in the mail but go ahead and start what you have at home. These notes will be faxed to Dr. Trudie Reed as requested.  I have sent in more hydrocodone in case the pain is unbearable.  I think it will be safe to take all of the medications together including Bentyl hydrocodone omeprazole and Carafate but when you are for starting them of course separate them out (offset the start times a little bit) so we know if you are having a problem with 1 specifically.

## 2022-12-15 NOTE — Patient Instructions (Addendum)
Very important to stop ibuprofen.  It causes ulcers. I think you have a stress ulcer although this is not confirmed and I could be completely wrong.  Regardless I think we would benefit from treating it as if you are going to get a stress ulcer from the problem and so I want you to go ahead and start Carafate and omeprazole for now and give it some time.  At least a couple weeks.  More omeprazole will come in the mail but go ahead and start what you have at home. These notes will be faxed to Dr. Trudie Reed as requested.  I have sent in more hydrocodone in case the pain is unbearable.  I think it will be safe to take all of the medications together including Bentyl hydrocodone omeprazole and Carafate but when you are for starting them of course separate them out (offset the start times a little bit) so we know if you are having a problem with 1 specifically.   Future Appointments  Date Time Provider [place]  12/29/2022  1:45 PM Benay Pike, MD [facility] None  01/04/2023  9:30 AM Ladene Artist, MD [facility]  01/07/2023  1:00 PM Loralee Pacas, MD [facility]

## 2022-12-15 NOTE — Progress Notes (Signed)
[name]: [id number]   Routine Medical Office Visit  Patient:  [name]      Age: 72 y.o.       Sex:  female  Date:   12/15/2022  PCP:    Loralee Pacas, [place] Provider: Loralee Pacas, MD   Assessment and Plan:   Upper abdominal pain Assessment & Plan: Asked certified medical assistant to fax this note to her rheumatologist, but I agree with patient reports that this seems unrelated to lupus I believe she needs ugi evaluation and esophagoduodenoscopy- referred care to gastroenterology won't happen to 01/04/23 she will schedule to see me soon after. In meantime I re-reviewed CT scan and don't see any  explanation for pain, and will give more pain medications which are helping. Also confirm gastrointestinal prophylaxis it seems like due to misunderstanding with pharmacist she is not taking omeprazole or carafate as prescribed, advised patient to go ahead and start them, but agree with pharmacist to offset the starts so if there is a problem we know which medication(s) to blame. AVS handout developed: Very important to stop ibuprofen.  It causes ulcers. I think you have a stress ulcer although this is not confirmed and I could be completely wrong.  Regardless I think we would benefit from treating it as if you are going to get a stress ulcer from the problem and so I want you to go ahead and start Carafate and omeprazole for now and give it some time.  At least a couple weeks.  More omeprazole will come in the mail but go ahead and start what you have at home. These notes will be faxed to Dr. Trudie Reed as requested.  I have sent in more hydrocodone in case the pain is unbearable.  I think it will be safe to take all of the medications together including Bentyl hydrocodone omeprazole and Carafate but when you are for starting them of course separate them out (offset the start times a little bit) so we know if you are having a problem with 1  specifically.       Orders: -     Omeprazole; Take 1 capsule (40 mg total) by mouth daily.  Dispense: 30 capsule; Refill: 5  Need for shingles vaccine -     Varicella-zoster vaccine IM  Gastric wall thickening  Generalized abdominal pain -     Omeprazole; Take 1 capsule (40 mg total) by mouth daily.  Dispense: 30 capsule; Refill: 5 -     HYDROcodone-Acetaminophen; Take 1 tablet by mouth every 6 (six) hours as needed for moderate pain or severe pain.  Dispense: 20 tablet; Refill: 0  Intractable abdominal pain -     HYDROcodone-Acetaminophen; Take 1 tablet by mouth every 6 (six) hours as needed for moderate pain or severe pain.  Dispense: 20 tablet; Refill: 0  Hordeolum externum of right lower eyelid  Abnormal finding on imaging of liver  Advised patient to use warm compresses and see eye Dr. If it persists for 2 weeks or gets too big       Clinical Presentation:   72 y.o. female here today for 1 month follow-up  Reviewed:  has a past medical history of Aortic atherosclerosis ([place]) (11/01/2022), Arthritis, Back pain, Blood transfusion, Breast cancer ([place]) (2009), Cataract, Chicken pox, Colon cancer ([place]) (2003), Depression, Difficulty standing (09/08/2022), Diverticular disease (11/01/2022), Diverticulitis, Edema of both ankles, Family history of breast cancer (10/24/2017), Gallbladder problem, Gastritis, Generalized abdominal pain (  12/15/2022), GERD (gastroesophageal reflux disease), Hepatitis, Hiatal hernia, History of stomach ulcers, Hypertension, Joint pain, Lactose intolerance, Lower extremity edema (01/11/2022), Lupus ([place]), Multiple gastric ulcers, Palpitations, Personal history of radiation therapy (2009), Prediabetes (09/08/2022), Rheumatic fever, Rheumatoid arthritis ([place]), SLE (systemic lupus erythematosus related syndrome) ([place]), Urinary incontinence, and UTI (urinary tract infection). Active Ambulatory Problems    Diagnosis Date Noted   Malignant neoplasm of  upper-outer quadrant of right breast in female, estrogen receptor positive ([place]) 01/11/2008   Essential hypertension 11/03/2007   Hiatal hernia 12/14/2007   DIVERTICULOSIS, COLON 12/14/2007   Lupus ([place]) 11/03/2007   Pain in joint 04/22/2009   Flank pain 11/03/2007   Family history of breast cancer 10/24/2017   Personal history of colon cancer 10/24/2017   History of breast cancer 08/07/2018   Family history of coronary artery disease in father 08/07/2018   Hyperlipidemia with target LDL less than 130 12/30/2020   LVH (left ventricular hypertrophy) due to hypertensive disease, without heart failure 12/30/2020   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   Class 1 obesity due to excess calories with serious comorbidity and body mass index (BMI) of 32.0 to 32.9 in adult 02/17/2021   Psychophysiological insomnia 02/17/2021   Lower extremity edema 01/11/2022   Primary localized osteoarthrosis of multiple sites 09/08/2022   Rheumatoid arthritis ([place]) 09/08/2022   GERD (gastroesophageal reflux disease) 09/08/2022   Prediabetes 09/08/2022   Difficulty standing 09/08/2022   Acute pain of right shoulder 11/01/2022   Hand pain, left 11/01/2022   Hand pain, right 11/01/2022   Aortic atherosclerosis ([place]) 11/01/2022   Diverticular disease 11/01/2022   Upper abdominal pain 11/01/2022   Chronic idiopathic constipation 11/01/2022   DDD (degenerative disc disease), 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/2022   Elevated amylase 11/22/2022   Abnormal finding on imaging of liver 11/30/2022   Intractable abdominal pain 12/15/2022   Gastric wall thickening 12/15/2022   Resolved Ambulatory Problems    Diagnosis Date Noted   NEUROPATHY 09/04/2009   CERUMEN IMPACTION, RIGHT 12/03/2009   OTITIS MEDIA, RIGHT 01/22/2010   HIP PAIN, RIGHT 11/03/2007   FATIGUE 09/04/2009   Cough 09/04/2009   Dysuria 04/22/2009   MAMMOGRAM, ABNORMAL, RIGHT 12/14/2007   Osteoarthritis of right knee 08/29/2011   Chest pain 10/05/2014    Hypokalemia 10/05/2014   Genetic testing 11/24/2017   Syncope and collapse 07/23/2018   Abdominal pain, epigastric    Gastritis and gastroduodenitis    Deficiency anemia 12/30/2020   Encounter for general adult medical examination with abnormal findings 12/30/2020   Generalized abdominal pain 12/15/2022   Past Medical History:  Diagnosis Date   Arthritis    Back pain    Blood transfusion    Breast cancer ([place]) 2009   Cataract    Chicken pox    Colon cancer ([place]) 2003   Depression    Diverticulitis    Edema of both ankles    Gallbladder problem    Gastritis    Hepatitis    History of stomach ulcers    Hypertension    Joint pain    Lactose intolerance    Multiple gastric ulcers    Palpitations    Personal history of radiation therapy 2009   Rheumatic fever    SLE (systemic lupus erythematosus related syndrome) ([facility])    Urinary incontinence    UTI (urinary tract infection)     Outpatient Medications Prior to Visit  Medication Sig   [employer] Fiber Gummy Bears Children CHEW Chew 20 g by mouth daily at 6 (six) AM.  dicyclomine (BENTYL) 10 MG capsule TAKE 1 CAPSULE (10 MG TOTAL) BY MOUTH 4 TIMES A DAY BEFORE MEALS AND AT BEDTIME   hydrochlorothiazide (HYDRODIURIL) 25 MG tablet Take 1 tablet (25 mg total) by mouth daily.   hydroxychloroquine (PLAQUENIL) 200 MG tablet Take 200 mg by mouth 2 (two) times daily.   ibuprofen (ADVIL) 800 MG tablet Take 800 mg by mouth every 8 (eight) hours as needed.   losartan (COZAAR) 25 MG tablet Take 1 tablet (25 mg total) by mouth daily.   metoprolol succinate (TOPROL-XL) 50 MG 24 hr tablet Take 1 tablet (50 mg total) by mouth daily.   Multiple Vitamins-Minerals (EMERGEN-C IMMUNE PLUS PO) Take 1 tablet by mouth daily.   potassium chloride (KLOR-CON) 10 MEQ tablet Take 1 tablet (10 mEq total) by mouth daily.   rosuvastatin (CRESTOR) 40 MG tablet Take 1 tablet (40 mg total) by mouth daily.   sucralfate (CARAFATE) 1 g tablet Take 1 tablet (1 g total)  by mouth 4 (four) times daily -  with meals and at bedtime.   [DISCONTINUED] HYDROcodone-acetaminophen (NORCO/VICODIN) 5-325 MG tablet Take 1 tablet by mouth every 6 (six) hours as needed for moderate pain or severe pain.   [DISCONTINUED] omeprazole (PRILOSEC) 40 MG capsule Take 1 capsule (40 mg total) by mouth daily.   No facility-administered medications prior to visit.    HPI  Updated and modified:  Problem  Intractable Abdominal Pain  Gastric Wall Thickening   11/2022 CT abdomen: Focal nodular wall thickening is seen at the GE junction and gastric fundus and gastric carcinoma cannot be excluded. Remainder of the stomach is unremarkable. No evidence of obstruction, inflammatory process or abnormal fluid collections.  Diverticulosis is seen mainly involving the descending and sigmoid colon, however there is no evidence of diverticulitis.   Abnormal Finding On Imaging of Liver   She is planned to see gastroenterologist for this.  12/13/22 CT abdomen: A few tiny sub-cm low attention lesions are too small to characterize, but most likely represent tiny cysts. 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 No suspicious hepatic masses are identified. Prior cholecystectomy. No evidence of biliary obstruction.     Upper Abdominal Pain   Radiates to the back and lower abdomen.has been present since late 2023 and we do not have a CAT scan yet for it.  X-ray abdomen showed only degenerative disc disease and chronic constipation. Has visit planned with gastrointestinal specialist 01/04/23  Has been taking bentyl and hydrocodone and it helps Has not been taking prescribed omeprazole and carafate due to pharmacist misunderstanding  11/30/2022 Office Visit Generalized abdominal pain  11/22/2022 Office Visit Contrast media allergy  11/01/2022 Office Visit Upper abdominal pain  09/08/2022 Office Visit Flank pain  At her last visit,  Patients  chart review and interview were used to generate a prompt for artificial intelligence analysis ([employer] artificial intelligence) clinical decision support.  AI output was reviewed and is provided in red:   Differential Diagnosis: 1. Gastric Carcinoma: Given the patient's age, history of cancer, and CT findings of focal nodular wall thickening at the GE junction and gastric fundus, gastric carcinoma is a primary concern. The absence of typical heartburn sensations and the specific location of pain further support this possibility. 2. Portal Hepatic Venous Fistula: While usually asymptomatic, the identified portal hepatic venous fistula could contribute to abdominal discomfort through altered hepatic hemodynamics, though it is less likely to be the primary cause of  her symptoms. 3. Liver Lesions: The tiny sub-centimeter low-attenuation lesions in the liver, most likely representing cysts, are unlikely to be symptomatic but warrant monitoring given the patient's cancer history. 4. Chronic Conditions Exacerbation: The patient's extensive medical history, including lupus (SLE), could contribute to her symptoms through mechanisms like serositis or lupus enteritis, though the current presentation and imaging findings lean more towards a localized gastric issue.   Generalized Abdominal Pain (Resolved)         Clinical Data Analysis:   Physical Exam  BP 131/75 (BP Location: Left Arm, Patient Position: Sitting)   Pulse (!) 57   Temp 97.9 F (36.6 C) (Temporal)   Ht 5\' 7"  (1.702 m)   Wt 183 lb 6.4 oz (83.2 kg)   SpO2 97%   BMI 28.72 kg/m  Wt Readings from Last 10 Encounters:  12/15/22 183 lb 6.4 oz (83.2 kg)  11/30/22 183 lb 3.2 oz (83.1 kg)  11/22/22 186 lb 3.2 oz (84.5 kg)  11/01/22 180 lb 3.2 oz (81.7 kg)  09/08/22 186 lb 6.4 oz (84.6 kg)  08/16/22 185 lb 3.2 oz (84 kg)  06/29/22 187 lb 1.6 oz (84.9 kg)  06/04/22 180 lb (81.6 kg)  04/12/22 188 lb (85.3 kg)  01/11/22 188 lb 1.6 oz (85.3  kg)   Vital signs reviewed.  Nursing notes reviewed. Weight trend reviewed. Abnormalities and Problem-Specific physical exam findings:  abdominal discomfort upper > generalized, there is R lower lid erythema and swelling consistent with stye  General Appearance:  No acute distress appreciable.   Well-groomed, healthy-appearing female.  Well proportioned with no abnormal fat distribution.  Good muscle tone. Skin: Clear and well-hydrated. Pulmonary:  Normal work of breathing at rest, no respiratory distress apparent. SpO2: 97 %  Musculoskeletal: Patient demonstrates smooth and coordinated movements throughout all major joints. All extremities are intact.  Neurological:  Awake, alert, oriented, and engaged.  No obvious focal neurological deficits or cognitive impairments.  Sensorium seems unclouded. Gait is smooth and coordinated.  Speech is clear and coherent with logical content. Psychiatric:  Appropriate mood, pleasant and cooperative demeanor, cheerful and engaged during the exam    Additional Results Reviewed:  Latest Reference Range & Units 09/08/22 12:37 09/08/22 13:30 11/15/22 12:02 11/16/22 08:14 11/23/22 15:03 12/13/22 13:24  COMPREHENSIVE METABOLIC PANEL  Rpt       Sodium 135 - 145 mEq/L 139       Potassium 3.5 - 5.1 mEq/L 4.0       Chloride 96 - 112 mEq/L 104       CO2 19 - 32 mEq/L 28       Glucose 70 - 99 mg/dL 86       BUN 6 - 23 mg/dL 12       Creatinine 0.40 - 1.20 mg/dL 0.69       Calcium 8.4 - 10.5 mg/dL 10.0       Alkaline Phosphatase 39 - 117 U/L 63       Albumin 3.5 - 5.2 g/dL 4.0       Amylase, Serum 31 - 110 U/L   125 (H)     Lipase 14 - 85 U/L   30     AST 0 - 37 U/L 26       ALT 0 - 35 U/L 20       Total Protein 6.0 - 8.3 g/dL 7.2       Total Bilirubin 0.2 - 1.2 mg/dL 0.5       GFR >60.00  mL/min 86.01       WBC 4.0 - 10.5 K/uL 4.4       RBC 3.87 - 5.11 Mil/uL 4.16       Hemoglobin 12.0 - 15.0 g/dL 12.9       HCT 36.0 - 46.0 % 39.1       MCV 78.0 - 100.0 fl  93.9       MCHC 30.0 - 36.0 g/dL 32.9       RDW 11.5 - 15.5 % 13.9       Platelets 150.0 - 400.0 K/uL 214.0       Hemoglobin A1C 4.6 - 6.5 % 5.6       H pylori Ag, Stl Negative       Negative  URINALYSIS, ROUTINE W REFLEX MICROSCOPIC  Rpt !       Appearance Clear;Turbid;Slightly Cloudy;Cloudy  CLEAR       Bilirubin Urine Negative  NEGATIVE       Color, Urine Yellow;Lt. Yellow;Straw;Dark Yellow;Amber;Green;Red;Brown  YELLOW       Hgb urine dipstick Negative  NEGATIVE       Ketones, ur Negative  NEGATIVE       Leukocytes,Ua Negative  NEGATIVE       Nitrite Negative  NEGATIVE       pH 5.0 - 8.0  6.5       Specific Gravity, Urine 1.000 - 1.030  1.015       Urine Glucose Negative  NEGATIVE       Urobilinogen, UA 0.0 - 1.0  1.0       Bacteria, UA None  Rare(<10/hpf) !       RBC / HPF 0-2/hpf  0-2/hpf       Renal Epithel, UA None  None Seen       Squamous Epithelial / HPF Rare(0-4/hpf)  Rare(0-4/hpf)       WBC, UA 0-2/hpf  0-2/hpf       Total Protein, Urine-UPE24 Negative  NEGATIVE       LAB REPORT - SCANNED     Rpt (E)    CT ABDOMEN PELVIS W WO CONTRAST      Rpt   DG ABD 1 VIEW   Rpt      DG LUMBAR SPINE COMPLETE 4 +V   Rpt      DG HIP UNILAT W OR W/O PELVIS 2-3 VIEWS RIGHT   Rpt      (H): Data is abnormally high !: Data is abnormal Rpt: View report in Results Review for more information (E): External lab result  CT ABDOMEN PELVIS W WO CONTRAST  Result Date: 11/24/2022 CLINICAL DATA:  Abdominal and epigastric pain for several months. Elevated amylase. EXAM: CT ABDOMEN AND PELVIS WITHOUT AND WITH CONTRAST TECHNIQUE: Multidetector CT imaging of the abdomen and pelvis was performed following the standard protocol before and following the bolus administration of intravenous contrast. RADIATION DOSE REDUCTION: This exam was performed according to the departmental dose-optimization program which includes automated exposure control, adjustment of the mA and/or kV according to patient size and/or  use of iterative reconstruction technique. CONTRAST:  157mL OMNIPAQUE IOHEXOL 300 MG/ML  SOLN COMPARISON:  Noncontrast CT on 07/16/2021 FINDINGS: Lower Chest: No acute findings. Hepatobiliary: A few tiny sub-cm low attention lesions are too small to characterize, but most likely represent tiny cysts. 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 No suspicious hepatic masses are identified. Prior cholecystectomy. No evidence of biliary  obstruction. Pancreas: No evidence of pancreatic mass or ductal dilatation. No radiographic signs of pancreatitis. Spleen: Within normal limits in size and appearance. Adrenals/Urinary Tract: No suspicious masses identified. No evidence of ureteral calculi or hydronephrosis. Stomach/Bowel: Focal nodular wall thickening is seen at the GE junction and gastric fundus and gastric carcinoma cannot be excluded. Remainder of the stomach is unremarkable. No evidence of obstruction, inflammatory process or abnormal fluid collections. Diverticulosis is seen mainly involving the descending and sigmoid colon, however there is no evidence of diverticulitis. Vascular/Lymphatic: No pathologically enlarged lymph nodes. No acute vascular findings. Reproductive: Prior hysterectomy noted. Adnexal regions are unremarkable in appearance. Other:  None. Musculoskeletal:  No suspicious bone lesions identified. IMPRESSION: Focal nodular wall thickening at the GE junction and gastric fundus. Gastric carcinoma cannot be excluded. Consider upper endoscopy or upper GI series for further evaluation. Colonic diverticulosis, without radiographic evidence of diverticulitis. Electronically Signed   By: Marlaine Hind M.D.   On: 11/24/2022 10:30     --------------------------------    Signed: Loralee Pacas, MD 12/15/2022 8:01 PM

## 2022-12-22 ENCOUNTER — Telehealth: Payer: Self-pay | Admitting: Internal Medicine

## 2022-12-22 NOTE — Telephone Encounter (Signed)
Copied from [place] [id number]. Topic: Medicare AWV >> Dec 22, 2022  9:50 AM Gillis Santa wrote: Reason for CRM: Called patient to schedule Medicare Annual Wellness Visit (AWV). Left message for patient to call back and schedule Medicare Annual Wellness Visit (AWV).  Last date of AWV: N/A  Please schedule an appointment at any time with Otila Kluver, [facility]. Please schedule AWVS with Otila Kluver, [place]..  If any questions, please contact me at [phone].  Thank you ,  Shaune Pollack [facility] AWV TEAM Direct Dial [phone]

## 2022-12-29 ENCOUNTER — Encounter: Payer: Self-pay | Admitting: Internal Medicine

## 2022-12-29 ENCOUNTER — Inpatient Hospital Stay: Payer: Medicare HMO | Attending: Hematology and Oncology | Admitting: Hematology and Oncology

## 2022-12-29 ENCOUNTER — Other Ambulatory Visit (HOSPITAL_BASED_OUTPATIENT_CLINIC_OR_DEPARTMENT_OTHER): Payer: Self-pay | Admitting: Internal Medicine

## 2022-12-29 VITALS — BP 128/68 | HR 72 | Temp 97.9°F | Resp 16 | Ht 67.0 in | Wt 186.1 lb

## 2022-12-29 DIAGNOSIS — I10 Essential (primary) hypertension: Secondary | ICD-10-CM | POA: Insufficient documentation

## 2022-12-29 DIAGNOSIS — Z923 Personal history of irradiation: Secondary | ICD-10-CM | POA: Diagnosis not present

## 2022-12-29 DIAGNOSIS — Z90710 Acquired absence of both cervix and uterus: Secondary | ICD-10-CM | POA: Diagnosis not present

## 2022-12-29 DIAGNOSIS — Z853 Personal history of malignant neoplasm of breast: Secondary | ICD-10-CM | POA: Insufficient documentation

## 2022-12-29 DIAGNOSIS — Z8261 Family history of arthritis: Secondary | ICD-10-CM | POA: Diagnosis not present

## 2022-12-29 DIAGNOSIS — Z79810 Long term (current) use of selective estrogen receptor modulators (SERMs): Secondary | ICD-10-CM | POA: Insufficient documentation

## 2022-12-29 DIAGNOSIS — Z882 Allergy status to sulfonamides status: Secondary | ICD-10-CM | POA: Diagnosis not present

## 2022-12-29 DIAGNOSIS — Z818 Family history of other mental and behavioral disorders: Secondary | ICD-10-CM | POA: Diagnosis not present

## 2022-12-29 DIAGNOSIS — Z9049 Acquired absence of other specified parts of digestive tract: Secondary | ICD-10-CM | POA: Insufficient documentation

## 2022-12-29 DIAGNOSIS — Z809 Family history of malignant neoplasm, unspecified: Secondary | ICD-10-CM | POA: Diagnosis not present

## 2022-12-29 DIAGNOSIS — K219 Gastro-esophageal reflux disease without esophagitis: Secondary | ICD-10-CM | POA: Diagnosis not present

## 2022-12-29 DIAGNOSIS — Z803 Family history of malignant neoplasm of breast: Secondary | ICD-10-CM | POA: Insufficient documentation

## 2022-12-29 DIAGNOSIS — Z8719 Personal history of other diseases of the digestive system: Secondary | ICD-10-CM | POA: Diagnosis not present

## 2022-12-29 DIAGNOSIS — Z91041 Radiographic dye allergy status: Secondary | ICD-10-CM | POA: Diagnosis not present

## 2022-12-29 DIAGNOSIS — Z85038 Personal history of other malignant neoplasm of large intestine: Secondary | ICD-10-CM | POA: Diagnosis not present

## 2022-12-29 DIAGNOSIS — N182 Chronic kidney disease, stage 2 (mild): Secondary | ICD-10-CM | POA: Insufficient documentation

## 2022-12-29 DIAGNOSIS — Z170 Estrogen receptor positive status [ER+]: Secondary | ICD-10-CM

## 2022-12-29 DIAGNOSIS — Z888 Allergy status to other drugs, medicaments and biological substances status: Secondary | ICD-10-CM | POA: Diagnosis not present

## 2022-12-29 DIAGNOSIS — Z880 Allergy status to penicillin: Secondary | ICD-10-CM | POA: Diagnosis not present

## 2022-12-29 DIAGNOSIS — C50411 Malignant neoplasm of upper-outer quadrant of right female breast: Secondary | ICD-10-CM | POA: Diagnosis not present

## 2022-12-29 DIAGNOSIS — Z823 Family history of stroke: Secondary | ICD-10-CM | POA: Diagnosis not present

## 2022-12-29 DIAGNOSIS — Z841 Family history of disorders of kidney and ureter: Secondary | ICD-10-CM | POA: Diagnosis not present

## 2022-12-29 DIAGNOSIS — Z8249 Family history of ischemic heart disease and other diseases of the circulatory system: Secondary | ICD-10-CM | POA: Diagnosis not present

## 2022-12-29 DIAGNOSIS — Z79899 Other long term (current) drug therapy: Secondary | ICD-10-CM | POA: Diagnosis not present

## 2022-12-29 DIAGNOSIS — R109 Unspecified abdominal pain: Secondary | ICD-10-CM | POA: Insufficient documentation

## 2022-12-29 NOTE — Progress Notes (Signed)
[facility]  [phone] [phone]     ID: [name] DOB: 05-03-1951  MR#: [id number]  XAJ#:[id number]  Patient Care Team: Lula Olszewski, MD as PCP - General (Internal Medicine) Chilton Si, MD as PCP - Cardiology (Cardiology) Zenovia Jordan, MD as Consulting Physician (Rheumatology) Meredith Pel, NP as Nurse Practitioner (Gastroenterology) Betha Loa, MD as Consulting Physician (Orthopedic Surgery) Rachel Moulds, MD as Consulting Physician (Hematology and Oncology)  CHIEF COMPLAINT: Estrogen receptor positive breast cancer  CURRENT TREATMENT: tamoxifen   INTERVAL HISTORY:  [name] returns today for follow-up of her estrogen receptor positive breast cancer.  She completed 10 years of tamoxifen and is here for follow-up.   Since her last visit, she has been having stomach pain for the past 3 or 4 months and had a CT which showed some fundal thickening and she is going to see her gastroenterologist next week, may need an endoscopy and biopsies.  Other than the stomach pain which has no relationship to food intake, she denies any unusual weight loss or change in bowel habits.  No change in breasts.  Rest of the pertinent 10 point ROS reviewed and negative  COVID 19 VACCINATION STATUS: Pfizer x3, most recently 07/2020; infection 03/25/2021  BREAST CANCER HISTORY: From Dr. Feliz Beam earlier summary:  "#1 patient is status post lumpectomy with sentinel lymph node biopsy followed by radiation to the right breast. In December 2009 patient began aromatase inhibitor but could not tolerate it.  #2 therefore she began in March 2010 tamoxifen 20 mg daily. However she has experienced significant amount of aches and pains and in December 2012 discontinued it. At this time she does not want to go back on any kind of antiestrogen therapy due to side effects.  #3 patient would like to go back on tamoxifen 20 mg daily. She recently  attended finding your new normal seminar. And because of that she wants to she would like to go back on the tamoxifen. We discussed side effects again. She was sent to her pharmacy."  Her subsequent history is as detailed below   PAST MEDICAL HISTORY: Past Medical History:  Diagnosis Date   Aortic atherosclerosis (HCC) 11/01/2022   On 2022 CT abdomen.   Arthritis    R knee, hands    Back pain    Blood transfusion    1986- post-partial hysterectomy   Breast cancer (HCC) 2009   right lumpectomy and radiation   Cataract    Chicken pox    Colon cancer (HCC) 2003   per pt report, cancer found in a colon polyp in 2003, procedure done in [place].  did not require surgery, chemo etc.     Depression    Difficulty standing 09/08/2022   Due to hip pain mainly- she agreed to physical therapy if insurance will cover   Diverticular disease 11/01/2022   On 2022 CT abd   Diverticulitis    Edema of both ankles    Family history of breast cancer 10/24/2017   Gallbladder problem    Gastritis    Generalized abdominal pain 12/15/2022   GERD (gastroesophageal reflux disease)    recently taken off anti reflux tx   Hepatitis    Hep. A- 1978   Hiatal hernia    History of stomach ulcers    Hypertension    Joint pain    Lactose intolerance    Lower extremity edema 01/11/2022   Lupus (HCC)    Multiple gastric ulcers  Palpitations    Personal history of radiation therapy 2009   Prediabetes 09/08/2022   Lab Results Component Value Date  HGBA1C 5.7 (H) 06/29/2021     Rheumatic fever    Rheumatoid arthritis (HCC)    SLE (systemic lupus erythematosus related syndrome) (HCC)    Urinary incontinence    UTI (urinary tract infection)     PAST SURGICAL HISTORY: Past Surgical History:  Procedure Laterality Date   ABDOMINAL HYSTERECTOMY     1986   APPENDECTOMY     1978 & tubal ligation    BIOPSY  07/25/2018   Procedure: BIOPSY;  Surgeon: Rachael Fee, MD;  Location: [facility] ENDOSCOPY;  Service:  Endoscopy;;   BREAST EXCISIONAL BIOPSY Left 1998   BREAST LUMPECTOMY Bilateral    2009   BREAST SURGERY     R breast lumpectomy   CHOLECYSTECTOMY     1978- open    ESOPHAGOGASTRODUODENOSCOPY (EGD) WITH PROPOFOL N/A 07/25/2018   Procedure: ESOPHAGOGASTRODUODENOSCOPY (EGD) WITH PROPOFOL;  Surgeon: Rachael Fee, MD;  Location: [facility] ENDOSCOPY;  Service: Endoscopy;  Laterality: N/A;   REPLACEMENT TOTAL KNEE Left    TONSILLECTOMY     1954   TOTAL KNEE ARTHROPLASTY  08/30/2011   Procedure: TOTAL KNEE ARTHROPLASTY;  Surgeon: Nestor Lewandowsky;  Location: [facility] OR;  Service: Orthopedics;  Laterality: Right;  Right Total Knee Arthroplasty   TUBAL LIGATION      FAMILY HISTORY Family History  Problem Relation Age of Onset   Breast cancer Mother 23   Hypertension Mother    Stroke Mother    Depression Mother    Heart attack Father    Other Father    Heart disease Father    Sudden death Father    Hypertension Sister    Hypertension Brother    Kidney disease Brother    Other Maternal Grandmother 102       brain tumor dx at 52- did not bx or do additional testing on tumor   Arthritis Maternal Grandmother    Cancer Paternal Grandmother 35       type unk   Breast cancer Maternal Aunt 37   Cancer Paternal Aunt 59       type unk   Cancer Other    Hypertension Other    Anesthesia problems Neg Hx    Hypotension Neg Hx    Malignant hyperthermia Neg Hx    Pseudochol deficiency Neg Hx    Colon cancer Neg Hx    Esophageal cancer Neg Hx    Stomach cancer Neg Hx    Pancreatic cancer Neg Hx    Liver disease Neg Hx    the patient's father died from a heart attack at the age of 64. The patient's mother died at the age of 14. She had a history of breast cancer and one of her sisters was diagnosed with breast cancer at the age of 72. The patient had 5 brothers and 5 sisters. There is no other history of breast or ovarian cancer in the family to her knowledge.   GYNECOLOGIC HISTORY:  No LMP recorded.  Patient has had a hysterectomy. Menarche age 29, first live birth age 22. The patient is GX P3. She had a simple hysterectomy in 1986 (no salpingo-oophorectomy).   SOCIAL HISTORY: (Updated February 2022). 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  She remarried  in 2011. Her husband Alfredo BachCecil is retired from [employer]. The patient has one adopted son in addition to her own children. 3 of her children live in town 1 in [place]. She has 7 grandchildren. She attends [employer] locally.    ADVANCED DIRECTIVES: Not in place   HEALTH MAINTENANCE: Social History   Tobacco Use   Smoking status: Never   Smokeless tobacco: Never  Vaping Use   Vaping Use: Never used  Substance Use Topics   Alcohol use: No   Drug use: No     Colonoscopy:  PAP:  Bone density: 04/30/2008 was normal  Lipid panel:  Allergies  Allergen Reactions   Iodine Anaphylaxis   Iohexol Anaphylaxis     Code: HIVES, Desc: PT IS ALLERGIC TO IODINATED CONTRAST; REACTION INCLUDES FACIAL SWELLING,HIVES,RESPIRATORY DISTRESS;PT HASN'T HAD SCAN W/PREMEDS.;REFUSED CONTRAST OF ANY KIND!  KR    Latex Anaphylaxis   Penicillins Anaphylaxis and Rash    Has patient had a PCN reaction causing immediate rash, facial/tongue/throat swelling, SOB or lightheadedness with hypotension: Yes Has patient had a PCN reaction causing severe rash involving mucus membranes or skin necrosis: Yes Has patient had a PCN reaction that required hospitalization: No Has patient had a PCN reaction occurring within the last 10 years: No If all of the above answers are "NO", then may proceed with Cephalosporin use.    Celecoxib Other (See Comments)    Severe stomach pain   Lactose Other (See Comments)    Lactose intolerant-cramping  Lactose intolerant   Other Other (See Comments)   Penicillin G  Other (See Comments)   Sulfa Antibiotics Hives    Current Outpatient Medications  Medication Sig Dispense Refill   [facility] Fiber Gummy Bears Children CHEW Chew 20 g by mouth daily at 6 (six) AM. 100 tablet 11   dicyclomine (BENTYL) 10 MG capsule TAKE 1 CAPSULE (10 MG TOTAL) BY MOUTH 4 TIMES A DAY BEFORE MEALS AND AT BEDTIME 360 capsule 1   hydrochlorothiazide (HYDRODIURIL) 25 MG tablet Take 1 tablet (25 mg total) by mouth daily. 90 tablet 3   HYDROcodone-acetaminophen (NORCO/VICODIN) 5-325 MG tablet Take 1 tablet by mouth every 6 (six) hours as needed for moderate pain or severe pain. 20 tablet 0   hydroxychloroquine (PLAQUENIL) 200 MG tablet Take 200 mg by mouth 2 (two) times daily.     ibuprofen (ADVIL) 800 MG tablet Take 800 mg by mouth every 8 (eight) hours as needed.     losartan (COZAAR) 25 MG tablet Take 1 tablet (25 mg total) by mouth daily. 90 tablet 3   metoprolol succinate (TOPROL-XL) 50 MG 24 hr tablet Take 1 tablet (50 mg total) by mouth daily. 90 tablet 3   Multiple Vitamins-Minerals (EMERGEN-C IMMUNE PLUS PO) Take 1 tablet by mouth daily.     omeprazole (PRILOSEC) 40 MG capsule Take 1 capsule (40 mg total) by mouth daily. 30 capsule 5   potassium chloride (KLOR-CON) 10 MEQ tablet Take 1 tablet (10 mEq total) by mouth daily. 90 tablet 1   rosuvastatin (CRESTOR) 40 MG tablet Take 1 tablet (40 mg total) by mouth daily. 90 tablet 3   sucralfate (CARAFATE) 1 g tablet Take 1 tablet (1 g total) by mouth 4 (four) times daily -  with meals and at bedtime. 30 tablet 1   No current facility-administered medications for this visit.    OBJECTIVE: African-American woman in no acute distress  Vitals:   12/29/22 1342  BP: 128/68  Pulse: 72  Resp: 16  Temp: 97.9 F (36.6 C)  SpO2: 100%      Body mass index is 29.15 kg/m.    ECOG FS:1 - Symptomatic but completely ambulatory   Physical Exam Constitutional:      Appearance: Normal appearance.  Cardiovascular:     Rate and Rhythm:  Normal rate and regular rhythm.  Chest:     Comments: Palpable area of likely fat necrosis noted in the right breast right behind the surgical scar measuring under centimeter.  No other palpable mass.  No other concerning changes.  No regional adenopathy. Musculoskeletal:     Cervical back: Normal range of motion and neck supple. No rigidity.  Lymphadenopathy:     Cervical: No cervical adenopathy.  Neurological:     Mental Status: She is alert.       LAB RESULTS:  CMP     Component Value Date/Time   NA 139 09/08/2022 1237   NA 141 07/14/2022 1338   NA 141 01/26/2017 1508   K 4.0 09/08/2022 1237   K 3.4 (L) 01/26/2017 1508   CL 104 09/08/2022 1237   CL 102 01/25/2013 0909   CO2 28 09/08/2022 1237   CO2 29 01/26/2017 1508   GLUCOSE 86 09/08/2022 1237   GLUCOSE 91 01/26/2017 1508   GLUCOSE 81 01/25/2013 0909   BUN 12 09/08/2022 1237   BUN 19 07/14/2022 1338   BUN 11.5 01/26/2017 1508   CREATININE 0.69 09/08/2022 1237   CREATININE 0.78 06/29/2022 1333   CREATININE 0.8 01/26/2017 1508   CALCIUM 10.0 09/08/2022 1237   CALCIUM 9.5 01/26/2017 1508   PROT 7.2 09/08/2022 1237   PROT 7.1 01/26/2017 1508   ALBUMIN 4.0 09/08/2022 1237   ALBUMIN 3.5 01/26/2017 1508   AST 26 09/08/2022 1237   AST 22 06/29/2022 1333   AST 22 01/26/2017 1508   ALT 20 09/08/2022 1237   ALT 16 06/29/2022 1333   ALT 17 01/26/2017 1508   ALKPHOS 63 09/08/2022 1237   ALKPHOS 72 01/26/2017 1508   BILITOT 0.5 09/08/2022 1237   BILITOT 0.5 06/29/2022 1333   BILITOT 0.38 01/26/2017 1508   GFRNONAA >60 06/29/2022 1333   GFRAA 91 09/04/2020 1037   GFRAA >60 02/15/2019 1436    INo results found for: "SPEP", "UPEP"  Lab Results  Component Value Date   WBC 4.4 09/08/2022   NEUTROABS 1.6 (L) 06/29/2022   HGB 12.9 09/08/2022   HCT 39.1 09/08/2022   MCV 93.9 09/08/2022   PLT 214.0 09/08/2022      Chemistry      Component Value Date/Time   NA 139 09/08/2022 1237   NA 141 07/14/2022 1338   NA  141 01/26/2017 1508   K 4.0 09/08/2022 1237   K 3.4 (L) 01/26/2017 1508   CL 104 09/08/2022 1237   CL 102 01/25/2013 0909   CO2 28 09/08/2022 1237   CO2 29 01/26/2017 1508   BUN 12 09/08/2022 1237   BUN 19 07/14/2022 1338   BUN 11.5 01/26/2017 1508   CREATININE 0.69 09/08/2022 1237   CREATININE 0.78 06/29/2022 1333   CREATININE 0.8 01/26/2017 1508      Component Value Date/Time   CALCIUM 10.0 09/08/2022 1237   CALCIUM 9.5 01/26/2017 1508   ALKPHOS 63 09/08/2022 1237   ALKPHOS 72 01/26/2017 1508   AST 26 09/08/2022 1237   AST 22 06/29/2022 1333   AST 22 01/26/2017 1508   ALT 20 09/08/2022 1237   ALT 16 06/29/2022  1333   ALT 17 01/26/2017 1508   BILITOT 0.5 09/08/2022 1237   BILITOT 0.5 06/29/2022 1333   BILITOT 0.38 01/26/2017 1508       Lab Results  Component Value Date   LABCA2 16 11/09/2010    No components found for: "LABCA125"  No results for input(s): "INR" in the last 168 hours.  Urinalysis    Component Value Date/Time   COLORURINE YELLOW 09/08/2022 1237   APPEARANCEUR CLEAR 09/08/2022 1237   LABSPEC 1.015 09/08/2022 1237   LABSPEC 1.015 01/26/2017 1523   PHURINE 6.5 09/08/2022 1237   GLUCOSEU NEGATIVE 09/08/2022 1237   GLUCOSEU Negative 01/26/2017 1523   HGBUR NEGATIVE 09/08/2022 1237   HGBUR negative 01/22/2010 1551   BILIRUBINUR NEGATIVE 09/08/2022 1237   BILIRUBINUR Negative 01/26/2017 1523   KETONESUR NEGATIVE 09/08/2022 1237   PROTEINUR NEGATIVE 06/29/2022 1313   UROBILINOGEN 1.0 09/08/2022 1237   UROBILINOGEN 0.2 01/26/2017 1523   NITRITE NEGATIVE 09/08/2022 1237   LEUKOCYTESUR NEGATIVE 09/08/2022 1237   LEUKOCYTESUR Small 01/26/2017 1523    STUDIES: No results found.    ASSESSMENT: 72 y.o. 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  (2) status post right lumpectomy  03/20/2008 for invasive lobular carcinoma, multifocal, pT1a pNX, stage IA, grade 2, strongly estrogen and progesterone receptor positive, HER-2 negative. with negative margins.  (3) status post right axillary sentinel lymph node sampling 04/17/2008, the single sentinel lymph node being clear  (4) Oncotype score of 10 predicts an outside the breast risk of recurrence of 7% if the patient's only systemic therapy is tamoxifen for 5 years.  (5) Additional right breast biopsy 05/23/2008 to evaluate microcalcifications seen on pre-radiotherapy mammography showed only atypical lobular hyperplasia  (6) Completed radiation to the right breast 07/26/2008 (50 gray +14 gray boost).  (7) Started on anastrozole November 2009, with poor tolerance  (8). Started on tamoxifen March 2010, continued to December 2012, resumed December 2014   (a) tamoxifen discontinued February 2022  (9) status post simple hysterectomy 1986 (no salpingo-oophorectomy)  (10) genetics testing 10/31/2017 through the Common Hereditary Cancers Panel offered by [facility] found no deleterious mutations in APC, ATM, AXIN2, BARD1, BMPR1A, BRCA1, BRCA2, BRIP1, CDH1, CDKN2A (p14ARF), CDKN2A (p16INK4a), CKD4, CHEK2, CTNNA1, DICER1, EPCAM (Deletion/duplication testing only), GREM1 (promoter region deletion/duplication testing only), KIT, MEN1, MLH1, MSH2, MSH3, MSH6, MUTYH, NBN, NF1, NHTL1, PALB2, PDGFRA, PMS2, POLD1, POLE, PTEN, RAD50, RAD51C, RAD51D, SDHB, SDHC, SDHD, SMAD4, SMARCA4. STK11, TP53, TSC1, TSC2, and VHL.  The following genes were evaluated for sequence changes only: SDHA and HOXB13 c.251G>A variant only.  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    PLAN:  Caileen is now 14 years out from definitive surgery for her breast cancer with no evidence of disease recurrence.  This is very favorable. She completed more than 10 years of tamoxifen.  She tolerated it very well.  At this time there  is no clinical concern for recurrence.  Her last mammogram without any evidence of recurrence. Mammogram due Oct 2024. She wants to continue follow-up in the breast clinic every 6 months With regards to her stomach pain, she had a CT and will be seeing her gastroenterologist next week. She may need an endoscopy and biopsy. She will RTC in 6 months or sooner as needed.  Total time spent: 30 minutes,   *Total Encounter Time as  defined by the Centers for Medicare and Medicaid Services includes, in addition to the face-to-face time of a patient visit (documented in the note above) non-face-to-face time: obtaining and reviewing outside history, ordering and reviewing medications, tests or procedures, care coordination (communications with other health care professionals or caregivers) and documentation in the medical record.

## 2023-01-04 ENCOUNTER — Encounter: Payer: Self-pay | Admitting: Gastroenterology

## 2023-01-04 ENCOUNTER — Ambulatory Visit (INDEPENDENT_AMBULATORY_CARE_PROVIDER_SITE_OTHER): Payer: Medicare HMO | Admitting: Gastroenterology

## 2023-01-04 VITALS — BP 142/76 | HR 59 | Ht 67.0 in | Wt 185.0 lb

## 2023-01-04 DIAGNOSIS — R933 Abnormal findings on diagnostic imaging of other parts of digestive tract: Secondary | ICD-10-CM

## 2023-01-04 DIAGNOSIS — R1013 Epigastric pain: Secondary | ICD-10-CM

## 2023-01-04 NOTE — Progress Notes (Signed)
Assessment     Epigastric pain, GERD, abnormal CT of the esophagus and proximal stomach - R/O neoplasm, esophagitis, gastritis, musculoskeletal pain Personal history of TAs, SSPs S/P cholecystectomy  Right breast cancer SLE RA   Recommendations    Schedule EGD. The risks (including bleeding, perforation, infection, missed lesions, medication reactions and possible hospitalization or surgery if complications occur), benefits, and alternatives to endoscopy with possible biopsy and possible dilation were discussed with the patient and they consent to proceed.   Continue omeprazole 40 mg daily, sucralfate 1 g 4 times daily and dicyclomine 10 mg 4 times daily Colonoscopy recommended in February 2028   HPI    This is a 72 year old female with epigastric pain and an abnormal CT of the GE junction, gastric fundus.  She relates a 44-month history of fairly constant epigastric pain that radiates around both upper quadrants and into her back.  There is an additional spasm-like quality to the pain that is relieved with dicyclomine.  She states laying down on her back relieves the pain.  Pain is not changed with meals or bowel movements.  CTAP as below.  She relates mild variation in her bowel habits.  Denies weight loss, change in stool caliber, melena, hematochezia, nausea, vomiting, dysphagia, reflux symptoms, chest pain.  EGD Feb 2023 erosive gastritis and a small hiatal herna  Labs / Imaging    CT AP 11/23/2022 Focal nodular wall thickening at the GE junction and gastric fundus. Gastric carcinoma cannot be excluded. Consider upper endoscopy or upper GI series for further evaluation.   Colonic diverticulosis, without radiographic evidence of diverticulitis.     Latest Ref Rng & Units 09/08/2022   12:37 PM 06/29/2022    1:33 PM 06/05/2022   12:34 AM  Hepatic Function  Total Protein 6.0 - 8.3 g/dL 7.2  7.6  6.8   Albumin 3.5 - 5.2 g/dL 4.0  3.9  3.9   AST 0 - 37 U/L 26  22  19    ALT  0 - 35 U/L 20  16  12    Alk Phosphatase 39 - 117 U/L 63  70  67   Total Bilirubin 0.2 - 1.2 mg/dL 0.5  0.5  0.4   Bilirubin, Direct 0.0 - 0.2 mg/dL   0.1        Latest Ref Rng & Units 09/08/2022   12:37 PM 06/29/2022    1:33 PM 06/04/2022    9:50 PM  CBC  WBC 4.0 - 10.5 K/uL 4.4  4.9  9.5   Hemoglobin 12.0 - 15.0 g/dL 38.1  82.9  93.7   Hematocrit 36.0 - 46.0 % 39.1  38.2  37.2   Platelets 150.0 - 400.0 K/uL 214.0  199  188    Current Medications, Allergies, Past Medical History, Past Surgical History, Family History and Social History were reviewed in [employer] record.   Physical Exam: General: Well developed, well nourished, no acute distress Head: Normocephalic and atraumatic Eyes: Sclerae anicteric, EOMI Ears: Normal auditory acuity Mouth: No deformities or lesions noted Lungs: Clear throughout to auscultation Heart: Regular rate and rhythm; No murmurs, rubs or bruits Abdomen: Soft, epigastric tenderness to light palpation and non distended. Well healed RUQ scar. No masses, hepatosplenomegaly or hernias noted. Normal Bowel sounds Rectal: Not done Musculoskeletal: Symmetrical with no gross deformities  Pulses:  Normal pulses noted Extremities: No edema or deformities noted Neurological: Alert oriented x 4, grossly nonfocal Psychological:  Alert and cooperative. Normal mood and  affect   Kristain Hu T. Russella Dar, MD 01/04/2023, 9:45 AM

## 2023-01-04 NOTE — Patient Instructions (Signed)
You have been scheduled for an endoscopy. Please follow written instructions given to you at your visit today. If you use inhalers (even only as needed), please bring them with you on the day of your procedure.  The  [facility] providers would like to encourage you to use MYCHART to communicate with providers for non-urgent requests or questions.  Due to long hold times on the telephone, sending your provider a message by MYCHART may be a faster and more efficient way to get a response.  Please allow 48 business hours for a response.  Please remember that this is for non-urgent requests.   Due to recent changes in healthcare laws, you may see the results of your imaging and laboratory studies on MyChart before your provider has had a chance to review them.  We understand that in some cases there may be results that are confusing or concerning to you. Not all laboratory results come back in the same time frame and the provider may be waiting for multiple results in order to interpret others.  Please give us 48 hours in order for your provider to thoroughly review all the results before contacting the office for clarification of your results.   Thank you for choosing me and [facility].  Malcolm T. Stark, Jr., MD., FACG  

## 2023-01-05 DIAGNOSIS — M79641 Pain in right hand: Secondary | ICD-10-CM | POA: Diagnosis not present

## 2023-01-05 DIAGNOSIS — M79642 Pain in left hand: Secondary | ICD-10-CM | POA: Diagnosis not present

## 2023-01-05 DIAGNOSIS — M329 Systemic lupus erythematosus, unspecified: Secondary | ICD-10-CM | POA: Diagnosis not present

## 2023-01-05 DIAGNOSIS — Z6829 Body mass index (BMI) 29.0-29.9, adult: Secondary | ICD-10-CM | POA: Diagnosis not present

## 2023-01-05 DIAGNOSIS — M1991 Primary osteoarthritis, unspecified site: Secondary | ICD-10-CM | POA: Diagnosis not present

## 2023-01-05 DIAGNOSIS — E663 Overweight: Secondary | ICD-10-CM | POA: Diagnosis not present

## 2023-01-05 DIAGNOSIS — R1013 Epigastric pain: Secondary | ICD-10-CM | POA: Diagnosis not present

## 2023-01-06 ENCOUNTER — Other Ambulatory Visit: Payer: Self-pay | Admitting: Hematology and Oncology

## 2023-01-06 DIAGNOSIS — Z1231 Encounter for screening mammogram for malignant neoplasm of breast: Secondary | ICD-10-CM

## 2023-01-07 ENCOUNTER — Ambulatory Visit: Payer: Medicare HMO | Admitting: Internal Medicine

## 2023-01-12 ENCOUNTER — Ambulatory Visit (AMBULATORY_SURGERY_CENTER): Payer: Medicare HMO | Admitting: Gastroenterology

## 2023-01-12 ENCOUNTER — Encounter: Payer: Self-pay | Admitting: Gastroenterology

## 2023-01-12 VITALS — BP 127/75 | HR 61 | Temp 97.5°F | Resp 9 | Ht 67.0 in | Wt 185.0 lb

## 2023-01-12 DIAGNOSIS — R1013 Epigastric pain: Secondary | ICD-10-CM | POA: Diagnosis not present

## 2023-01-12 DIAGNOSIS — K449 Diaphragmatic hernia without obstruction or gangrene: Secondary | ICD-10-CM | POA: Diagnosis not present

## 2023-01-12 DIAGNOSIS — R933 Abnormal findings on diagnostic imaging of other parts of digestive tract: Secondary | ICD-10-CM

## 2023-01-12 MED ORDER — GLYCOPYRROLATE 2 MG PO TABS: 1.0000 mg | ORAL_TABLET | Freq: Two times a day (BID) | ORAL | 3 refills | Status: DC

## 2023-01-12 MED ORDER — SODIUM CHLORIDE 0.9 % IV SOLN
500.0000 mL | Freq: Once | INTRAVENOUS | Status: DC
Start: 2023-01-12 — End: 2023-02-07

## 2023-01-12 NOTE — Op Note (Signed)
[facility] Patient Name: [name] Procedure Date: 01/12/2023 10:14 AM MRN: [id number] Endoscopist: Meryl Dare , MD, [phone] Age: 72 Referring MD:  Date of Birth: Jun 11, 1951 Gender: Female Account #: [id number] Procedure:                Upper GI endoscopy Indications:              Epigastric abdominal pain, Abnormal CT of the GI                            tract (distal esophagus, proximal stomach) Medicines:                Monitored Anesthesia Care Procedure:                Pre-Anesthesia Assessment:                           - Prior to the procedure, a History and Physical                            was performed, and patient medications and                            allergies were reviewed. The patient's tolerance of                            previous anesthesia was also reviewed. The risks                            and benefits of the procedure and the sedation                            options and risks were discussed with the patient.                            All questions were answered, and informed consent                            was obtained. Prior Anticoagulants: The patient has                            taken no anticoagulant or antiplatelet agents. ASA                            Grade Assessment: II - A patient with mild systemic                            disease. After reviewing the risks and benefits,                            the patient was deemed in satisfactory condition to                            undergo the procedure.  After obtaining informed consent, the endoscope was                            passed under direct vision. Throughout the                            procedure, the patient's blood pressure, pulse, and                            oxygen saturations were monitored continuously. The                            GIF [id number] #[id number] was introduced through the                            mouth, and  advanced to the second part of duodenum.                            The upper GI endoscopy was accomplished without                            difficulty. The patient tolerated the procedure                            well. Scope In: Scope Out: Findings:                 The examined esophagus was normal.                           A medium-sized hiatal hernia was present.                           The exam of the stomach was otherwise normal.                           The duodenal bulb and second portion of the                            duodenum were normal. Complications:            No immediate complications. Estimated Blood Loss:     Estimated blood loss: none. Impression:               - Normal esophagus.                           - Medium-sized hiatal hernia.                           - Normal duodenal bulb and second portion of the                            duodenum.                           - No specimens collected. Recommendation:           -  Patient has a contact number available for                            emergencies. The signs and symptoms of potential                            delayed complications were discussed with the                            patient. Return to normal activities tomorrow.                            Written discharge instructions were provided to the                            patient.                           - Resume previous diet.                           - Follow antireflux measures.                           - Continue present medications. Meryl Dare, MD 01/12/2023 10:42:18 AM This report has been signed electronically.

## 2023-01-12 NOTE — Progress Notes (Signed)
See 01/04/2023 H&P, no changes

## 2023-01-12 NOTE — Progress Notes (Signed)
Sedate, gd SR, tolerated procedure well, VSS, report to RN 

## 2023-01-12 NOTE — Patient Instructions (Addendum)
YOU HAD AN ENDOSCOPIC PROCEDURE TODAY AT THE  ENDOSCOPY CENTER:   Refer to the procedure report that was given to you for any specific questions about what was found during the examination.  If the procedure report does not answer your questions, please call your gastroenterologist to clarify.  If you requested that your care partner not be given the details of your procedure findings, then the procedure report has been included in a sealed envelope for you to review at your convenience later.  YOU SHOULD EXPECT: Some feelings of bloating in the abdomen. Passage of more gas than usual.  Walking can help get rid of the air that was put into your GI tract during the procedure and reduce the bloating. If you had a lower endoscopy (such as a colonoscopy or flexible sigmoidoscopy) you may notice spotting of blood in your stool or on the toilet paper. If you underwent a bowel prep for your procedure, you may not have a normal bowel movement for a few days.  Please Note:  You might notice some irritation and congestion in your nose or some drainage.  This is from the oxygen used during your procedure.  There is no need for concern and it should clear up in a day or so.  SYMPTOMS TO REPORT IMMEDIATELY:   Following upper endoscopy (EGD)  Vomiting of blood or coffee ground material  New chest pain or pain under the shoulder blades  Painful or persistently difficult swallowing  New shortness of breath  Fever of 100F or higher  Black, tarry-looking stools  For urgent or emergent issues, a gastroenterologist can be reached at any hour by calling [phone]. Do not use MyChart messaging for urgent concerns.    DIET:  We do recommend a small meal at first, but then you may proceed to your regular diet.  Drink plenty of fluids but you should avoid alcoholic beverages for 24 hours.  MEDICATIONS: Continue present medications. Try Glycopyrrolate 1 mg by mouth twice daily (this RX has been sent to  your pharmacy of choice with 1 year of refills).  FOLLOW UP: Please follow anti-reflux precautions (see handout).  Thank you for allowing [name] to provide for your healthcare needs today.  ACTIVITY:  You should plan to take it easy for the rest of today and you should NOT DRIVE or use heavy machinery until tomorrow (because of the sedation medicines used during the test).    FOLLOW UP: Our staff will call the number listed on your records the next business day following your procedure.  We will call around 7:15- 8:00 am to check on you and address any questions or concerns that you may have regarding the information given to you following your procedure. If we do not reach you, we will leave a message.     If any biopsies were taken you will be contacted by phone or by letter within the next 1-3 weeks.  Please call us at [phone] if you have not heard about the biopsies in 3 weeks.    SIGNATURES/CONFIDENTIALITY: You and/or your care partner have signed paperwork which will be entered into your electronic medical record.  These signatures attest to the fact that that the information above on your After Visit Summary has been reviewed and is understood.  Full responsibility of the confidentiality of this discharge information lies with you and/or your care-partner.

## 2023-01-12 NOTE — Progress Notes (Signed)
Pt's states no medical or surgical changes since previsit or office visit. 

## 2023-01-12 NOTE — Progress Notes (Signed)
While giving discharge instructions, patient states that her insurance company Administrator) will not pay for her Diclyclomine (Bentyl), Dr. Russella Dar notified and is here to speak with patient. New order taken for Glycopyrrolate 1 mg twice daily with one year of refills. This has been sent to [facility] per patient's request.

## 2023-01-13 ENCOUNTER — Telehealth: Payer: Self-pay | Admitting: Internal Medicine

## 2023-01-13 ENCOUNTER — Telehealth: Payer: Self-pay | Admitting: *Deleted

## 2023-01-13 NOTE — Telephone Encounter (Signed)
Contacted Wyvonna Plum to schedule their annual wellness visit. Appointment made for 01/20/2023.  Gabriel Cirri [facility] TEAM Direct Dial [phone]

## 2023-01-13 NOTE — Telephone Encounter (Signed)
  Follow up Call-     01/12/2023    9:17 AM 11/10/2021    9:33 AM  Call back number  Post procedure Call Back phone  # [phone]  Permission to leave phone message Yes Yes     Patient questions:  Do you have a fever, pain , or abdominal swelling? No. Pain Score  0 *  Have you tolerated food without any problems? Yes.    Have you been able to return to your normal activities? Yes.    Do you have any questions about your discharge instructions: Diet   No. Medications  No. Follow up visit  No.  Do you have questions or concerns about your Care? No.  Actions: * If pain score is 4 or above: No action needed, pain <4.

## 2023-01-20 ENCOUNTER — Ambulatory Visit (INDEPENDENT_AMBULATORY_CARE_PROVIDER_SITE_OTHER): Payer: Medicare HMO

## 2023-01-20 VITALS — Wt 185.0 lb

## 2023-01-20 DIAGNOSIS — Z0000 Encounter for general adult medical examination without abnormal findings: Secondary | ICD-10-CM | POA: Diagnosis not present

## 2023-01-20 NOTE — Patient Instructions (Signed)
[name] , Thank you for taking time to come for your Medicare Wellness Visit. I appreciate your ongoing commitment to your health goals. Please review the following plan we discussed and let me know if I can assist you in the future.   These are the goals we discussed:  Goals      Patient Stated     Exercise more         This is a list of the screening recommended for you and due dates:  Health Maintenance  Topic Date Due   Zoster (Shingles) Vaccine (2 of 2) 02/09/2023   Flu Shot  04/21/2023   Medicare Annual Wellness Visit  01/20/2024   Mammogram  07/06/2024   Colon Cancer Screening  11/10/2026   DTaP/Tdap/Td vaccine (3 - Td or Tdap) 12/31/2030   Pneumonia Vaccine  Completed   DEXA scan (bone density measurement)  Completed   HPV Vaccine  Aged Out   COVID-19 Vaccine  Discontinued   Hepatitis C Screening: USPSTF Recommendation to screen - Ages 69-79 yo.  Discontinued    Advanced directives: Advance directive discussed with you today. Even though you declined this today please call our office should you change your mind and we can give you the proper paperwork for you to fill out.  Conditions/risks identified: get in more exercise   Next appointment: Follow up in one year for your annual wellness visit    Preventive Care 65 Years and Older, Female Preventive care refers to lifestyle choices and visits with your health care provider that can promote health and wellness. What does preventive care include? A yearly physical exam. This is also called an annual well check. Dental exams once or twice a year. Routine eye exams. Ask your health care provider how often you should have your eyes checked. Personal lifestyle choices, including: Daily care of your teeth and gums. Regular physical activity. Eating a healthy diet. Avoiding tobacco and drug use. Limiting alcohol use. Practicing safe sex. Taking low-dose aspirin every day. Taking vitamin and mineral supplements as  recommended by your health care provider. What happens during an annual well check? The services and screenings done by your health care provider during your annual well check will depend on your age, overall health, lifestyle risk factors, and family history of disease. Counseling  Your health care provider may ask you questions about your: Alcohol use. Tobacco use. Drug use. Emotional well-being. Home and relationship well-being. Sexual activity. Eating habits. History of falls. Memory and ability to understand (cognition). Work and work Astronomer. Reproductive health. Screening  You may have the following tests or measurements: Height, weight, and BMI. Blood pressure. Lipid and cholesterol levels. These may be checked every 5 years, or more frequently if you are over 84 years old. Skin check. Lung cancer screening. You may have this screening every year starting at age 25 if you have a 30-pack-year history of smoking and currently smoke or have quit within the past 15 years. Fecal occult blood test (FOBT) of the stool. You may have this test every year starting at age 1. Flexible sigmoidoscopy or colonoscopy. You may have a sigmoidoscopy every 5 years or a colonoscopy every 10 years starting at age 58. Hepatitis C blood test. Hepatitis B blood test. Sexually transmitted disease (STD) testing. Diabetes screening. This is done by checking your blood sugar (glucose) after you have not eaten for a while (fasting). You may have this done every 1-3 years. Bone density scan. This is done to screen  for osteoporosis. You may have this done starting at age 36. Mammogram. This may be done every 1-2 years. Talk to your health care provider about how often you should have regular mammograms. Talk with your health care provider about your test results, treatment options, and if necessary, the need for more tests. Vaccines  Your health care provider may recommend certain vaccines, such  as: Influenza vaccine. This is recommended every year. Tetanus, diphtheria, and acellular pertussis (Tdap, Td) vaccine. You may need a Td booster every 10 years. Zoster vaccine. You may need this after age 63. Pneumococcal 13-valent conjugate (PCV13) vaccine. One dose is recommended after age 30. Pneumococcal polysaccharide (PPSV23) vaccine. One dose is recommended after age 36. Talk to your health care provider about which screenings and vaccines you need and how often you need them. This information is not intended to replace advice given to you by your health care provider. Make sure you discuss any questions you have with your health care provider. Document Released: 10/03/2015 Document Revised: 05/26/2016 Document Reviewed: 07/08/2015 Elsevier Interactive Patient Education  2017 [place] Prevention in the Home Falls can cause injuries. They can happen to people of all ages. There are many things you can do to make your home safe and to help prevent falls. What can I do on the outside of my home? Regularly fix the edges of walkways and driveways and fix any cracks. Remove anything that might make you trip as you walk through a door, such as a raised step or threshold. Trim any bushes or trees on the path to your home. Use bright outdoor lighting. Clear any walking paths of anything that might make someone trip, such as rocks or tools. Regularly check to see if handrails are loose or broken. Make sure that both sides of any steps have handrails. Any raised decks and porches should have guardrails on the edges. Have any leaves, snow, or ice cleared regularly. Use sand or salt on walking paths during winter. Clean up any spills in your garage right away. This includes oil or grease spills. What can I do in the bathroom? Use night lights. Install grab bars by the toilet and in the tub and shower. Do not use towel bars as grab bars. Use non-skid mats or decals in the tub or  shower. If you need to sit down in the shower, use a plastic, non-slip stool. Keep the floor dry. Clean up any water that spills on the floor as soon as it happens. Remove soap buildup in the tub or shower regularly. Attach bath mats securely with double-sided non-slip rug tape. Do not have throw rugs and other things on the floor that can make you trip. What can I do in the bedroom? Use night lights. Make sure that you have a light by your bed that is easy to reach. Do not use any sheets or blankets that are too big for your bed. They should not hang down onto the floor. Have a firm chair that has side arms. You can use this for support while you get dressed. Do not have throw rugs and other things on the floor that can make you trip. What can I do in the kitchen? Clean up any spills right away. Avoid walking on wet floors. Keep items that you use a lot in easy-to-reach places. If you need to reach something above you, use a strong step stool that has a grab bar. Keep electrical cords out of the way. Do  not use floor polish or wax that makes floors slippery. If you must use wax, use non-skid floor wax. Do not have throw rugs and other things on the floor that can make you trip. What can I do with my stairs? Do not leave any items on the stairs. Make sure that there are handrails on both sides of the stairs and use them. Fix handrails that are broken or loose. Make sure that handrails are as long as the stairways. Check any carpeting to make sure that it is firmly attached to the stairs. Fix any carpet that is loose or worn. Avoid having throw rugs at the top or bottom of the stairs. If you do have throw rugs, attach them to the floor with carpet tape. Make sure that you have a light switch at the top of the stairs and the bottom of the stairs. If you do not have them, ask someone to add them for you. What else can I do to help prevent falls? Wear shoes that: Do not have high heels. Have  rubber bottoms. Are comfortable and fit you well. Are closed at the toe. Do not wear sandals. If you use a stepladder: Make sure that it is fully opened. Do not climb a closed stepladder. Make sure that both sides of the stepladder are locked into place. Ask someone to hold it for you, if possible. Clearly mark and make sure that you can see: Any grab bars or handrails. First and last steps. Where the edge of each step is. Use tools that help you move around (mobility aids) if they are needed. These include: Canes. Walkers. Scooters. Crutches. Turn on the lights when you go into a dark area. Replace any light bulbs as soon as they burn out. Set up your furniture so you have a clear path. Avoid moving your furniture around. If any of your floors are uneven, fix them. If there are any pets around you, be aware of where they are. Review your medicines with your doctor. Some medicines can make you feel dizzy. This can increase your chance of falling. Ask your doctor what other things that you can do to help prevent falls. This information is not intended to replace advice given to you by your health care provider. Make sure you discuss any questions you have with your health care provider. Document Released: 07/03/2009 Document Revised: 02/12/2016 Document Reviewed: 10/11/2014 Elsevier Interactive Patient Education  2017 [employer].

## 2023-01-20 NOTE — Progress Notes (Signed)
I connected with  Wyvonna Plum on 01/20/23 by a audio enabled telemedicine application and verified that I am speaking with the correct person using two identifiers.  Patient Location: Home  Provider Location: Office/Clinic  I discussed the limitations of evaluation and management by telemedicine. The patient expressed understanding and agreed to proceed.     Patient Medicare AWV questionnaire was completed by the patient on 01/16/23; I have confirmed that all information answered by patient is correct and no changes since this date.      Subjective:   [name] is a 72 y.o. female who presents for an Initial Medicare Annual Wellness Visit.  Review of Systems     Cardiac Risk Factors include: advanced age (>75men, >78 women);hypertension;dyslipidemia     Objective:    Today's Vitals   01/20/23 0803  Weight: 185 lb (83.9 kg)   Body mass index is 28.98 kg/m.     01/20/2023    8:11 AM 10/21/2022   10:28 AM 06/04/2022    9:49 PM 01/03/2022   12:16 PM 07/16/2021   10:13 PM 06/30/2021    9:56 PM 12/03/2019    7:16 PM  Advanced Directives  Does Patient Have a Medical Advance Directive? No No No No No No No  Would patient like information on creating a medical advance directive? No - Patient declined No - Patient declined  Yes (ED - Information included in AVS) No - Patient declined No - Patient declined No - Patient declined    Current Medications (verified) Outpatient Encounter Medications as of 01/20/2023  Medication Sig   [employer] Fiber Gummy Bears Children CHEW Chew 20 g by mouth daily at 6 (six) AM.   hydrochlorothiazide (HYDRODIURIL) 25 MG tablet Take 1 tablet (25 mg total) by mouth daily.   HYDROcodone-acetaminophen (NORCO/VICODIN) 5-325 MG tablet Take 1 tablet by mouth every 6 (six) hours as needed for moderate pain or severe pain.   hydroxychloroquine (PLAQUENIL) 200 MG tablet Take 200 mg by mouth 2 (two) times daily.   ibuprofen (ADVIL) 800 MG tablet Take 800 mg  by mouth every 8 (eight) hours as needed.   losartan (COZAAR) 25 MG tablet Take 1 tablet (25 mg total) by mouth daily.   metoprolol succinate (TOPROL-XL) 50 MG 24 hr tablet Take 1 tablet (50 mg total) by mouth daily.   Multiple Vitamins-Minerals (EMERGEN-C IMMUNE PLUS PO) Take 1 tablet by mouth daily.   omeprazole (PRILOSEC) 40 MG capsule Take 1 capsule (40 mg total) by mouth daily.   potassium chloride (KLOR-CON) 10 MEQ tablet TAKE 1 TABLET BY MOUTH EVERY DAY   sucralfate (CARAFATE) 1 g tablet Take 1 tablet (1 g total) by mouth 4 (four) times daily -  with meals and at bedtime.   glycopyrrolate (ROBINUL) 2 MG tablet Take 0.5 tablets (1 mg total) by mouth 2 (two) times daily. (Patient not taking: Reported on 01/20/2023)   [DISCONTINUED] dicyclomine (BENTYL) 10 MG capsule TAKE 1 CAPSULE (10 MG TOTAL) BY MOUTH 4 TIMES A DAY BEFORE MEALS AND AT BEDTIME   Facility-Administered Encounter Medications as of 01/20/2023  Medication   0.9 %  sodium chloride infusion    Allergies (verified) Iodine, Iohexol, Latex, Penicillins, Celecoxib, Lactose, Other, Penicillin g, and Sulfa antibiotics   History: Past Medical History:  Diagnosis Date   Aortic atherosclerosis (HCC) 11/01/2022   On 2022 CT abdomen.   Arthritis    R knee, hands    Back pain    Blood transfusion    1986-  post-partial hysterectomy   Breast cancer [facility]) 2009   right lumpectomy and radiation   Cataract    Chicken pox    Colon cancer (HCC) 2003   per pt report, cancer found in a colon polyp in 2003, procedure done in [place].  did not require surgery, chemo etc.     Depression    Difficulty standing 09/08/2022   Due to hip pain mainly- she agreed to physical therapy if insurance will cover   Diverticular disease 11/01/2022   On 2022 CT abd   Diverticulitis    Edema of both ankles    Family history of breast cancer 10/24/2017   Gallbladder problem    Gastritis    Generalized abdominal pain 12/15/2022   GERD (gastroesophageal  reflux disease)    recently taken off anti reflux tx   Hepatitis    Hep. A- 1978   Hiatal hernia    History of stomach ulcers    Hypertension    Joint pain    Lactose intolerance    Lower extremity edema 01/11/2022   Lupus (HCC)    Multiple gastric ulcers    Palpitations    Personal history of radiation therapy 2009   Prediabetes 09/08/2022   Lab Results Component Value Date  HGBA1C 5.7 (H) 06/29/2021     Rheumatic fever    Rheumatoid arthritis (HCC)    SLE (systemic lupus erythematosus related syndrome) (HCC)    Urinary incontinence    UTI (urinary tract infection)    Past Surgical History:  Procedure Laterality Date   ABDOMINAL HYSTERECTOMY     1986   APPENDECTOMY     1978 & tubal ligation    BIOPSY  07/25/2018   Procedure: BIOPSY;  Surgeon: Rachael Fee, MD;  Location: [facility] ENDOSCOPY;  Service: Endoscopy;;   BREAST EXCISIONAL BIOPSY Left 1998   BREAST LUMPECTOMY Bilateral    2009   BREAST SURGERY     R breast lumpectomy   CHOLECYSTECTOMY     1978- open    ESOPHAGOGASTRODUODENOSCOPY (EGD) WITH PROPOFOL N/A 07/25/2018   Procedure: ESOPHAGOGASTRODUODENOSCOPY (EGD) WITH PROPOFOL;  Surgeon: Rachael Fee, MD;  Location: [facility] ENDOSCOPY;  Service: Endoscopy;  Laterality: N/A;   REPLACEMENT TOTAL KNEE Left    TONSILLECTOMY     1954   TOTAL KNEE ARTHROPLASTY  08/30/2011   Procedure: TOTAL KNEE ARTHROPLASTY;  Surgeon: Nestor Lewandowsky;  Location: [facility] OR;  Service: Orthopedics;  Laterality: Right;  Right Total Knee Arthroplasty   TUBAL LIGATION     Family History  Problem Relation Age of Onset   Breast cancer Mother 3   Hypertension Mother    Stroke Mother    Depression Mother    Heart attack Father    Other Father    Heart disease Father    Sudden death Father    Hypertension Sister    Hypertension Brother    Kidney disease Brother    Other Maternal Grandmother 102       brain tumor dx at 65- did not bx or do additional testing on tumor   Arthritis Maternal  Grandmother    Cancer Paternal Grandmother 66       type unk   Breast cancer Maternal Aunt 81   Cancer Paternal Aunt 58       type unk   Cancer Other    Hypertension Other    Anesthesia problems Neg Hx    Hypotension Neg Hx    Malignant hyperthermia Neg Hx  Pseudochol deficiency Neg Hx    Colon cancer Neg Hx    Esophageal cancer Neg Hx    Stomach cancer Neg Hx    Pancreatic cancer Neg Hx    Liver disease Neg Hx    Social History   Socioeconomic History   Marital status: Single    Spouse name: Not on file   Number of children: 4   Years of education: Not on file   Highest education level: Master's degree (e.g., MA, MS, MEng, MEd, MSW, MBA)  Occupational History   Occupation: Retired Engineer, civil (consulting)  Tobacco Use   Smoking status: Never   Smokeless tobacco: Never  Vaping Use   Vaping Use: Never used  Substance and Sexual Activity   Alcohol use: No   Drug use: No   Sexual activity: Yes  Other Topics Concern   Not on file  Social History Narrative   ** Merged History Encounter **       Social Determinants of Health   Financial Resource Strain: Medium Risk (01/16/2023)   Overall Financial Resource Strain (CARDIA)    Difficulty of Paying Living Expenses: Somewhat hard  Food Insecurity: Food Insecurity Present (01/16/2023)   Hunger Vital Sign    Worried About Running Out of Food in the Last Year: Never true    Ran Out of Food in the Last Year: Sometimes true  Transportation Needs: No Transportation Needs (01/16/2023)   PRAPARE - Administrator, Civil Service (Medical): No    Lack of Transportation (Non-Medical): No  Physical Activity: Insufficiently Active (01/16/2023)   Exercise Vital Sign    Days of Exercise per Week: 2 days    Minutes of Exercise per Session: 20 min  Stress: No Stress Concern Present (01/16/2023)   [employer] of Occupational Health - Occupational Stress Questionnaire    Feeling of Stress : Only a little  Social Connections: Socially  Integrated (01/16/2023)   Social Connection and Isolation Panel [NHANES]    Frequency of Communication with Friends and Family: More than three times a week    Frequency of Social Gatherings with Friends and Family: More than three times a week    Attends Religious Services: More than 4 times per year    Active Member of [employer] or Organizations: Yes    Attends Engineer, structural: More than 4 times per year    Marital Status: Living with partner    Tobacco Counseling Counseling given: Not Answered   Clinical Intake:  Pre-visit preparation completed: Yes  Pain : No/denies pain     BMI - recorded: 28.98  How often do you need to have someone help you when you read instructions, pamphlets, or other written materials from your doctor or pharmacy?: (P) 1 - Never  Diabetic?no  Interpreter Needed?: No  Information entered by :: Lanier Ensign, LPN   Activities of Daily Living    01/16/2023    8:43 AM  In your present state of health, do you have any difficulty performing the following activities:  Hearing? 0  Vision? 0  Difficulty concentrating or making decisions? 0  Walking or climbing stairs? 0  Dressing or bathing? 0  Doing errands, shopping? 0  Preparing Food and eating ? N  Using the Toilet? N  In the past six months, have you accidently leaked urine? N  Do you have problems with loss of bowel control? N  Managing your Medications? N  Managing your Finances? N  Housekeeping or managing your Housekeeping? N  Patient Care Team: Lula Olszewski, MD as PCP - General (Internal Medicine) Chilton Si, MD as PCP - Cardiology (Cardiology) Zenovia Jordan, MD as Consulting Physician (Rheumatology) Meredith Pel, NP as Nurse Practitioner (Gastroenterology) Betha Loa, MD as Consulting Physician (Orthopedic Surgery) Rachel Moulds, MD as Consulting Physician (Hematology and Oncology) Meryl Dare, MD as Consulting Physician  (Gastroenterology)  Indicate any recent Medical Services you may have received from other than [facility] providers in the past year (date may be approximate).     Assessment:   This is a routine wellness examination for Vashon.  Hearing/Vision screen Hearing Screening - Comments:: Pt wears hearing aids  Vision Screening - Comments:: Pt follows up with [facility] for annual eye exams   Dietary issues and exercise activities discussed: Current Exercise Habits: Home exercise routine, Type of exercise: walking;Other - see comments, Time (Minutes): 20, Frequency (Times/Week): 2, Weekly Exercise (Minutes/Week): 40   Goals Addressed             This Visit's Progress    Patient Stated       Exercise more        Depression Screen    01/20/2023    8:10 AM 01/20/2023    8:08 AM 09/08/2022   10:09 AM 06/18/2021    8:37 AM 12/30/2020    2:14 PM  PHQ 2/9 Scores  PHQ - 2 Score 0 0 0 1 0  PHQ- 9 Score    8     Fall Risk    01/16/2023    8:43 AM 11/22/2022    2:30 PM 09/08/2022   10:09 AM  Fall Risk   Falls in the past year? 0 0 0  Number falls in past yr: 0 0 0  Injury with Fall? 0 0 0  Risk for fall due to : Impaired vision No Fall Risks No Fall Risks  Follow up Falls prevention discussed Falls evaluation completed Falls evaluation completed    FALL RISK PREVENTION PERTAINING TO THE HOME:  Any stairs in or around the home? No  If so, are there any without handrails? No  Home free of loose throw rugs in walkways, pet beds, electrical cords, etc? Yes  Adequate lighting in your home to reduce risk of falls? Yes   ASSISTIVE DEVICES UTILIZED TO PREVENT FALLS:  Life alert? No  Use of a cane, walker or w/c? No  Grab bars in the bathroom? No  Shower chair or bench in shower? No  Elevated toilet seat or a handicapped toilet? No   TIMED UP AND GO:  Was the test performed? No .   Cognitive Function:declined        Immunizations Immunization History  Administered  Date(s) Administered   Influenza Whole 06/27/2009   Influenza, High Dose Seasonal PF 06/03/2019   Influenza-Unspecified 07/02/2015, 08/16/2020, 07/20/2022   PFIZER(Purple Top)SARS-COV-2 Vaccination 11/22/2019, 12/18/2019, 08/16/2020   Pneumococcal Conjugate-13 06/03/2019   Pneumococcal Polysaccharide-23 12/30/2020   Td 11/24/2007   Tdap 12/30/2020   Zoster Recombinat (Shingrix) 12/15/2022    TDAP status: Up to date  Flu Vaccine status: Up to date  Pneumococcal vaccine status: Up to date  Covid-19 vaccine status: Completed vaccines  Qualifies for Shingles Vaccine? Yes   Zostavax completed Yes   Shingrix Completed?: No.    Education has been provided regarding the importance of this vaccine. Patient has been advised to call insurance company to determine out of pocket expense if they have not yet received this vaccine. Advised may also receive  vaccine at local pharmacy or Health Dept. Verbalized acceptance and understanding.  Screening Tests Health Maintenance  Topic Date Due   Zoster Vaccines- Shingrix (2 of 2) 02/09/2023   INFLUENZA VACCINE  04/21/2023   Medicare Annual Wellness (AWV)  01/20/2024   MAMMOGRAM  07/06/2024   COLONOSCOPY (Pts 45-6yrs Insurance coverage will need to be confirmed)  11/10/2026   DTaP/Tdap/Td (3 - Td or Tdap) 12/31/2030   Pneumonia Vaccine 44+ Years old  Completed   DEXA SCAN  Completed   HPV VACCINES  Aged Out   COVID-19 Vaccine  Discontinued   Hepatitis C Screening  Discontinued    Health Maintenance  There are no preventive care reminders to display for this patient.   Colorectal cancer screening: Type of screening: Colonoscopy. Completed 2/21//23. Repeat every 5 years  Mammogram status: Completed 07/06/22. Repeat every year  Bone Density status: Completed 04/30/08. Results reflect: Bone density results: NORMAL. Repeat every 2 years.   Additional Screening:  Hepatitis C Screening: does not qualify;  Vision Screening: Recommended  annual ophthalmology exams for early detection of glaucoma and other disorders of the eye. Is the patient up to date with their annual eye exam?  Yes  Who is the provider or what is the name of the office in which the patient attends annual eye exams? [facility] ophthalmology  If pt is not established with a provider, would they like to be referred to a provider to establish care? No .   Dental Screening: Recommended annual dental exams for proper oral hygiene  Community Resource Referral / Chronic Care Management: CRR required this visit?  No   CCM required this visit?  No      Plan:     I have personally reviewed and noted the following in the patient's chart:   Medical and social history Use of alcohol, tobacco or illicit drugs  Current medications and supplements including opioid prescriptions. Patient is currently taking opioid prescriptions. Information provided to patient regarding non-opioid alternatives. Patient advised to discuss non-opioid treatment plan with their provider. Functional ability and status Nutritional status Physical activity Advanced directives List of other physicians Hospitalizations, surgeries, and ER visits in previous 12 months Vitals Screenings to include cognitive, depression, and falls Referrals and appointments  In addition, I have reviewed and discussed with patient certain preventive protocols, quality metrics, and best practice recommendations. A written personalized care plan for preventive services as well as general preventive health recommendations were provided to patient.     Marzella Schlein, LPN   09/25/1094   Nurse Notes: pt declined cognition exams , pt was knowledgeable with questions asked

## 2023-01-26 DIAGNOSIS — M329 Systemic lupus erythematosus, unspecified: Secondary | ICD-10-CM | POA: Diagnosis not present

## 2023-02-07 ENCOUNTER — Ambulatory Visit (INDEPENDENT_AMBULATORY_CARE_PROVIDER_SITE_OTHER): Payer: Medicare HMO | Admitting: Internal Medicine

## 2023-02-07 ENCOUNTER — Encounter: Payer: Self-pay | Admitting: Internal Medicine

## 2023-02-07 VITALS — BP 122/70 | HR 68 | Temp 98.0°F | Ht 67.0 in | Wt 180.4 lb

## 2023-02-07 DIAGNOSIS — R1084 Generalized abdominal pain: Secondary | ICD-10-CM

## 2023-02-07 DIAGNOSIS — K2100 Gastro-esophageal reflux disease with esophagitis, without bleeding: Secondary | ICD-10-CM

## 2023-02-07 DIAGNOSIS — R109 Unspecified abdominal pain: Secondary | ICD-10-CM

## 2023-02-07 DIAGNOSIS — I10 Essential (primary) hypertension: Secondary | ICD-10-CM | POA: Diagnosis not present

## 2023-02-07 DIAGNOSIS — M19049 Primary osteoarthritis, unspecified hand: Secondary | ICD-10-CM

## 2023-02-07 DIAGNOSIS — K449 Diaphragmatic hernia without obstruction or gangrene: Secondary | ICD-10-CM | POA: Diagnosis not present

## 2023-02-07 DIAGNOSIS — I700 Atherosclerosis of aorta: Secondary | ICD-10-CM | POA: Diagnosis not present

## 2023-02-07 DIAGNOSIS — R748 Abnormal levels of other serum enzymes: Secondary | ICD-10-CM | POA: Diagnosis not present

## 2023-02-07 LAB — COMPREHENSIVE METABOLIC PANEL
ALT: 11 U/L (ref 0–35)
AST: 22 U/L (ref 0–37)
Albumin: 3.9 g/dL (ref 3.5–5.2)
Alkaline Phosphatase: 81 U/L (ref 39–117)
BUN: 10 mg/dL (ref 6–23)
CO2: 28 mEq/L (ref 19–32)
Calcium: 9.9 mg/dL (ref 8.4–10.5)
Chloride: 103 mEq/L (ref 96–112)
Creatinine, Ser: 0.82 mg/dL (ref 0.40–1.20)
GFR: 70.68 mL/min (ref >60.00)
Glucose, Bld: 69 mg/dL — ABNORMAL LOW (ref 70–99)
Potassium: 3.9 mEq/L (ref 3.5–5.1)
Sodium: 140 mEq/L (ref 135–145)
Total Bilirubin: 0.4 mg/dL (ref 0.2–1.2)
Total Protein: 6.7 g/dL (ref 6.0–8.3)

## 2023-02-07 LAB — AMYLASE: Amylase: 57 U/L (ref 27–131)

## 2023-02-07 MED ORDER — PEPCID COMPLETE 10-800-165 MG PO CHEW: 1.0000 | CHEWABLE_TABLET | Freq: Two times a day (BID) | ORAL | 11 refills | Status: DC | PRN

## 2023-02-07 MED ORDER — HYDROCODONE-ACETAMINOPHEN 5-325 MG PO TABS
1.0000 | ORAL_TABLET | Freq: Four times a day (QID) | ORAL | 0 refills | Status: DC | PRN
Start: 2023-02-07 — End: 2023-05-05

## 2023-02-07 MED ORDER — OMEPRAZOLE 20 MG PO CPDR
20.0000 mg | DELAYED_RELEASE_CAPSULE | Freq: Every day | ORAL | 3 refills | Status: DC
Start: 2023-02-07 — End: 2023-05-10

## 2023-02-07 MED ORDER — HYDROCODONE-ACETAMINOPHEN 5-325 MG PO TABS
1.0000 | ORAL_TABLET | Freq: Four times a day (QID) | ORAL | 0 refills | Status: DC | PRN
Start: 2023-02-07 — End: 2023-02-07

## 2023-02-07 NOTE — Assessment & Plan Note (Addendum)
Encouraged patient to to try cholesterol medication(s), and try Extra Virgin Olive Oil supplementation, she prefers not to take the statin that was sent.

## 2023-02-07 NOTE — Assessment & Plan Note (Signed)
I went over the analysis so far talked about how the CAT scan and the EGD both demonstrated that the moderate hiatal hernia seem to be the major contributor to her chronic severe abdominal pain and mostly ruled out cancer at this point.  However the fact that it continues to bother her means there is chronic inflammation there and that does represent a risk factor for future cancer.  I therefore encouraged her to at least discuss with the surgeon about the potential for a hiatal hernia repair and we went over how that surgery might look but at this time she would like to hold off on that offer and understands that delaying intervention does have risks  She has a thorough standing of everything and makes this decision with full capacity consistent with her values and preferences  Try to increase Pepcid complete with the Maalox for now.  She requests hydrocodone refills for breakthrough flares.

## 2023-02-07 NOTE — Progress Notes (Signed)
[facility]: [phone]   Routine Medical Office Visit  Patient:  [name]      Age: 72 y.o.       Sex:  female  Date:   02/07/2023 PCP:    Lula Olszewski, MD   Today's Healthcare Provider: Lula Olszewski, MD   Assessment and Plan:   Hiatal hernia Assessment & Plan: Declines surgery referral despite chronic abdomen pain associated with this 02/07/2023   Orders: -     Pepcid Complete; Chew 1 tablet by mouth 2 (two) times daily as needed.  Dispense: 100 tablet; Refill: 11 -     Omeprazole; Take 1 capsule (20 mg total) by mouth daily.  Dispense: 90 capsule; Refill: 3 -     H. pylori antigen, stool  Generalized abdominal pain -     HYDROcodone-Acetaminophen; Take 1 tablet by mouth every 6 (six) hours as needed for moderate pain or severe pain.  Dispense: 20 tablet; Refill: 0  Intractable abdominal pain Assessment & Plan: I went over the analysis so far talked about how the CAT scan and the EGD both demonstrated that the moderate hiatal hernia seem to be the major contributor to her chronic severe abdominal pain and mostly ruled out cancer at this point.  However the fact that it continues to bother her means there is chronic inflammation there and that does represent a risk factor for future cancer.  I therefore encouraged her to at least discuss with the surgeon about the potential for a hiatal hernia repair and we went over how that surgery might look but at this time she would like to hold off on that offer and understands that delaying intervention does have risks  She has a thorough standing of everything and makes this decision with full capacity consistent with her values and preferences  Try to increase Pepcid complete with the Maalox for now.  She requests hydrocodone refills for breakthrough flares.    Orders: -     HYDROcodone-Acetaminophen; Take 1 tablet by mouth every 6 (six) hours as needed for moderate pain or severe pain.   Dispense: 20 tablet; Refill: 0  Gastroesophageal reflux disease with esophagitis without hemorrhage Assessment & Plan: She would like to try to come off omeprazole as much as possible even though the abdomen pain seems gastroesophageal reflux disease/hiatal hernia related   Orders: -     Pepcid Complete; Chew 1 tablet by mouth 2 (two) times daily as needed.  Dispense: 100 tablet; Refill: 11 -     Omeprazole; Take 1 capsule (20 mg total) by mouth daily.  Dispense: 90 capsule; Refill: 3 -     H. pylori antigen, stool  Hand arthritis Assessment & Plan: Must avoid NSAIDs due to abdominal intolerance. Therefore give hydrocodone as needed for abdomen pain in limited amounts.  The hand pain doesn't get that bad with plaquenil.   Aortic atherosclerosis (HCC) Assessment & Plan: Encouraged patient to to try cholesterol medication(s), and try Extra Virgin Olive Oil supplementation   Elevated amylase Assessment & Plan: Will recheck levels.  Orders: -     Comprehensive metabolic panel -     Amylase -     Lipase  Essential hypertension -     Comprehensive metabolic panel     Treatment plan discussed and reviewed in detail. Explained medication safety and potential side effects. Agreed on patient returning to office if symptoms worsen, persist, or new symptoms develop. Discussed precautions in case of needing to visit  the Emergency Department. Answered all patient questions and confirmed understanding and comfort with the plan. Encouraged patient to contact our office if they have any questions or concerns.       Clinical Presentation:    72 y.o. female here today for Abdominal Pain (Has calmed down but not resolved.) and Medication change  HPI   The following table summarizes the actions taken during the encounter to manage (by editing,updating, adding,and resolving)  Problem  Intractable Abdominal Pain   01/13/2023 interim history:   carafate, bentyl (price ineffective),  glycopyrrolate (dry mouth) all stopped Maalox helps, would like to dc proton pump inhibitor (PPI) stomach acid reducer.   Prior history IMPRESSION: Focal nodular wall thickening at the GE junction and gastric fundus. Gastric carcinoma cannot be excluded. Consider upper endoscopy or upper GI series for further evaluation.  Colonic diverticulosis, without radiographic evidence of diverticulitis.  Impression:               - Normal esophagus.                           - Medium-sized hiatal hernia.                           - Normal duodenal bulb and second portion of the                            duodenum.                           - No specimens collected. Recommendation:           - Patient has a contact number available for                            emergencies. The signs and symptoms of potential                            delayed complications were discussed with the                            patient. Return to normal activities tomorrow.                            Written discharge instructions were provided to the                            patient.                           - Resume previous diet.                           - Follow antireflux measures.                           - Continue present medications. 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 Statin myopathy history: never took, cholesterol medication(s)  never smoked. Prefers to avoid medication(s)  Current Regimen: Diet and exercise   Lab Results  Component Value Date   CHOL 153 06/29/2021   CHOL 159 12/30/2020   CHOL 143 11/24/2007   Lab Results  Component Value Date   HDL 57 06/29/2021   HDL 61.30 12/30/2020   HDL 55 11/24/2007   Lab Results   Component Value Date   LDLCALC 80 06/29/2021   LDLCALC 76 12/30/2020   LDLCALC 71 11/24/2007   Lab Results  Component Value Date   TRIG 83 06/29/2021   TRIG 109.0 12/30/2020   TRIG 86 11/24/2007   Lab Results  Component Value Date   CHOLHDL 3 12/30/2020   CHOLHDL 2.6 Ratio 11/24/2007  No results found for: "LDLDIRECT"  The 10-year ASCVD risk score (Arnett DK, et al., 2019) is: 22.9%   Values used to calculate the score:     Age: 36 years     Sex: Female     Is Non-Hispanic African American: Yes     Diabetic: Yes     Tobacco smoker: No     Systolic Blood Pressure: 122 mmHg     Is BP treated: Yes     HDL Cholesterol: 57 mg/dL     Total Cholesterol: 153 mg/dL    Gerd (Gastroesophageal Reflux Disease)   Associated with hiatal hernia Takes occasional as needed omeprazole burst. Encouraged try pepcid complete.   Hiatal Hernia  Acute Pain of Right Shoulder (Resolved)  Hand Pain, Left (Resolved)  Family History of Breast Cancer (Resolved)  Flank Pain (Resolved)   Urgent care told UTI responsible but she reports she didn't have one. History of kidney stones and kind of feels like that Started 3 weeks ago         Reviewed chart data: Active Ambulatory Problems    Diagnosis Date Noted   Malignant neoplasm of upper-outer quadrant of right breast in female, estrogen receptor positive (HCC) 01/11/2008   Essential hypertension 11/03/2007   Hiatal hernia 12/14/2007   DIVERTICULOSIS, COLON 12/14/2007   Lupus (HCC) 11/03/2007   Pain in joint 04/22/2009   Personal history of colon cancer 10/24/2017   History of breast cancer 08/07/2018   Family history of coronary artery disease in father 08/07/2018   Hyperlipidemia with target LDL less than 130 12/30/2020   LVH (left ventricular hypertrophy) due to hypertensive disease, without heart failure 12/30/2020   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   Class 1 obesity due to excess calories with serious comorbidity and body  mass index (BMI) of 32.0 to 32.9 in adult 02/17/2021   Psychophysiological insomnia 02/17/2021   Lower extremity edema 01/11/2022   Primary localized osteoarthrosis of multiple sites 09/08/2022   Rheumatoid arthritis (HCC) 09/08/2022   GERD (gastroesophageal reflux disease) 09/08/2022   Prediabetes 09/08/2022   Difficulty standing 09/08/2022   Hand arthritis 11/01/2022   Aortic atherosclerosis (HCC) 11/01/2022   Diverticular disease 11/01/2022   Upper abdominal pain 11/01/2022   Chronic idiopathic constipation 11/01/2022   DDD (degenerative disc disease), 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/2022   Elevated amylase 11/22/2022   Abnormal finding on imaging of liver 11/30/2022   Intractable abdominal pain 12/15/2022   Gastric wall thickening 12/15/2022   Chronic kidney disease, stage 2 (mild) 12/29/2022   Resolved Ambulatory Problems    Diagnosis Date Noted  NEUROPATHY 09/04/2009   CERUMEN IMPACTION, RIGHT 12/03/2009   OTITIS MEDIA, RIGHT 01/22/2010   HIP PAIN, RIGHT 11/03/2007   FATIGUE 09/04/2009   Cough 09/04/2009   Dysuria 04/22/2009   Flank pain 11/03/2007   MAMMOGRAM, ABNORMAL, RIGHT 12/14/2007   Osteoarthritis of right knee 08/29/2011   Chest pain 10/05/2014   Hypokalemia 10/05/2014   Family history of breast cancer 10/24/2017   Genetic testing 11/24/2017   Syncope and collapse 07/23/2018   Abdominal pain, epigastric    Gastritis and gastroduodenitis    Deficiency anemia 12/30/2020   Encounter for general adult medical examination with abnormal findings 12/30/2020   Acute pain of right shoulder 11/01/2022   Hand pain, left 11/01/2022   Generalized abdominal pain 12/15/2022   Past Medical History:  Diagnosis Date   Arthritis    Back pain    Blood transfusion    Breast cancer (HCC) 2009   Cataract    Chicken pox    Colon cancer (HCC) 2003   Depression    Diverticulitis    Edema of both ankles    Gallbladder problem    Gastritis    Hepatitis    History of stomach ulcers     Hypertension    Joint pain    Lactose intolerance    Multiple gastric ulcers    Palpitations    Personal history of radiation therapy 2009   Rheumatic fever    SLE (systemic lupus erythematosus related syndrome) (HCC)    Urinary incontinence    UTI (urinary tract infection)     Outpatient Medications Prior to Visit  Medication Sig Note   [facility] Fiber Gummy Bears Children CHEW Chew 20 g by mouth daily at 6 (six) AM.    hydrochlorothiazide (HYDRODIURIL) 25 MG tablet Take 1 tablet (25 mg total) by mouth daily.    hydroxychloroquine (PLAQUENIL) 200 MG tablet Take 200 mg by mouth 2 (two) times daily.    losartan (COZAAR) 25 MG tablet Take 1 tablet (25 mg total) by mouth daily.    metoprolol succinate (TOPROL-XL) 50 MG 24 hr tablet Take 1 tablet (50 mg total) by mouth daily.    Multiple Vitamins-Minerals (EMERGEN-C IMMUNE PLUS PO) Take 1 tablet by mouth daily.    potassium chloride (KLOR-CON) 10 MEQ tablet TAKE 1 TABLET BY MOUTH EVERY DAY    [DISCONTINUED] HYDROcodone-acetaminophen (NORCO/VICODIN) 5-325 MG tablet Take 1 tablet by mouth every 6 (six) hours as needed for moderate pain or severe pain.    [DISCONTINUED] omeprazole (PRILOSEC) 40 MG capsule Take 1 capsule (40 mg total) by mouth daily. 02/07/2023: taper off slowly using maalox and pepcid complete   [DISCONTINUED] glycopyrrolate (ROBINUL) 2 MG tablet Take 0.5 tablets (1 mg total) by mouth 2 (two) times daily. (Patient not taking: Reported on 01/20/2023)    [DISCONTINUED] ibuprofen (ADVIL) 800 MG tablet Take 800 mg by mouth every 8 (eight) hours as needed.    [DISCONTINUED] sucralfate (CARAFATE) 1 g tablet Take 1 tablet (1 g total) by mouth 4 (four) times daily -  with meals and at bedtime.    [DISCONTINUED] 0.9 %  sodium chloride infusion     No facility-administered medications prior to visit.         Clinical Data Analysis:   Physical Exam  BP 122/70 (BP Location: Left Arm, Patient Position: Sitting)   Pulse 68   Temp 98 F (36.7  C) (Temporal)   Ht 5\' 7"  (1.702 m)   Wt 180 lb 6.4 oz (81.8 kg)  SpO2 95%   BMI 28.25 kg/m  Wt Readings from Last 10 Encounters:  02/07/23 180 lb 6.4 oz (81.8 kg)  01/20/23 185 lb (83.9 kg)  01/12/23 185 lb (83.9 kg)  01/04/23 185 lb (83.9 kg)  12/29/22 186 lb 1.6 oz (84.4 kg)  12/15/22 183 lb 6.4 oz (83.2 kg)  11/30/22 183 lb 3.2 oz (83.1 kg)  11/22/22 186 lb 3.2 oz (84.5 kg)  11/01/22 180 lb 3.2 oz (81.7 kg)  09/08/22 186 lb 6.4 oz (84.6 kg)   Vital signs reviewed.  Nursing notes reviewed. Weight trend reviewed. Abnormalities and Problem-Specific physical exam findings:  mild weight loss noted-hand arthritis not obvious. General Appearance:  No acute distress appreciable.   Well-groomed, healthy-appearing female.  Well proportioned with no abnormal fat distribution.  Good muscle tone. Skin: Clear and well-hydrated. Pulmonary:  Normal work of breathing at rest, no respiratory distress apparent. SpO2: 95 %  Musculoskeletal: All extremities are intact.  Neurological:  Awake, alert, oriented, and engaged.  No obvious focal neurological deficits or cognitive impairments.  Sensorium seems unclouded.   Speech is clear and coherent with logical content. Psychiatric:  Appropriate mood, pleasant and cooperative demeanor, thoughtful and engaged during the exam  Results Reviewed:    Results for orders placed or performed in visit on 02/07/23  Comp Met (CMET)  Result Value Ref Range   Sodium 140 135 - 145 mEq/L   Potassium 3.9 3.5 - 5.1 mEq/L   Chloride 103 96 - 112 mEq/L   CO2 28 19 - 32 mEq/L   Glucose, Bld 69 (L) 70 - 99 mg/dL   BUN 10 6 - 23 mg/dL   Creatinine, Ser 1.61 0.40 - 1.20 mg/dL   Total Bilirubin 0.4 0.2 - 1.2 mg/dL   Alkaline Phosphatase 81 39 - 117 U/L   AST 22 0 - 37 U/L   ALT 11 0 - 35 U/L   Total Protein 6.7 6.0 - 8.3 g/dL   Albumin 3.9 3.5 - 5.2 g/dL   GFR 09.60 >45.40 mL/min   Calcium 9.9 8.4 - 10.5 mg/dL  Amylase  Result Value Ref Range   Amylase 57 27 -  131 U/L    Recent Results (from the past 2160 hour(s))  Amylase     Status: Abnormal   Collection Time: 11/15/22 12:02 PM  Result Value Ref Range   Amylase 125 (H) 31 - 110 U/L  Lipase     Status: None   Collection Time: 11/15/22 12:02 PM  Result Value Ref Range   Lipase 30 14 - 85 U/L  Lab report - scanned     Status: None   Collection Time: 11/16/22  8:14 AM  Result Value Ref Range   EGFR 77.0     Comment: Abstracted by HIM  H. pylori antigen, stool     Status: None   Collection Time: 12/13/22  1:24 PM  Result Value Ref Range   H pylori Ag, Stl Negative Negative  Comp Met (CMET)     Status: Abnormal   Collection Time: 02/07/23 10:54 AM  Result Value Ref Range   Sodium 140 135 - 145 mEq/L   Potassium 3.9 3.5 - 5.1 mEq/L   Chloride 103 96 - 112 mEq/L   CO2 28 19 - 32 mEq/L   Glucose, Bld 69 (L) 70 - 99 mg/dL   BUN 10 6 - 23 mg/dL   Creatinine, Ser 9.81 0.40 - 1.20 mg/dL   Total Bilirubin 0.4 0.2 - 1.2 mg/dL  Alkaline Phosphatase 81 39 - 117 U/L   AST 22 0 - 37 U/L   ALT 11 0 - 35 U/L   Total Protein 6.7 6.0 - 8.3 g/dL   Albumin 3.9 3.5 - 5.2 g/dL   GFR 16.10 >96.04 mL/min    Comment: Calculated using the CKD-EPI Creatinine Equation (2021)   Calcium 9.9 8.4 - 10.5 mg/dL  Amylase     Status: None   Collection Time: 02/07/23 10:54 AM  Result Value Ref Range   Amylase 57 27 - 131 U/L    No image results found.   CT ABDOMEN PELVIS W WO CONTRAST  Result Date: 11/24/2022 CLINICAL DATA:  Abdominal and epigastric pain for several months. Elevated amylase. EXAM: CT ABDOMEN AND PELVIS WITHOUT AND WITH CONTRAST TECHNIQUE: Multidetector CT imaging of the abdomen and pelvis was performed following the standard protocol before and following the bolus administration of intravenous contrast. RADIATION DOSE REDUCTION: This exam was performed according to the departmental dose-optimization program which includes automated exposure control, adjustment of the mA and/or kV according to  patient size and/or use of iterative reconstruction technique. CONTRAST:  OMNIPAQUE IOHEXOL 300 MG/ML  SOLN COMPARISON:  Noncontrast CT on 07/16/2021 FINDINGS: Lower Chest: No acute findings. Hepatobiliary: A few tiny sub-cm low attention lesions are too small to characterize, but most likely represent tiny cysts. 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 No suspicious hepatic masses are identified. Prior cholecystectomy. No evidence of biliary obstruction. Pancreas: No evidence of pancreatic mass or ductal dilatation. No radiographic signs of pancreatitis. Spleen: Within normal limits in size and appearance. Adrenals/Urinary Tract: No suspicious masses identified. No evidence of ureteral calculi or hydronephrosis. Stomach/Bowel: Focal nodular wall thickening is seen at the GE junction and gastric fundus and gastric carcinoma cannot be excluded. Remainder of the stomach is unremarkable. No evidence of obstruction, inflammatory process or abnormal fluid collections. Diverticulosis is seen mainly involving the descending and sigmoid colon, however there is no evidence of diverticulitis. Vascular/Lymphatic: No pathologically enlarged lymph nodes. No acute vascular findings. Reproductive: Prior hysterectomy noted. Adnexal regions are unremarkable in appearance. Other:  None. Musculoskeletal:  No suspicious bone lesions identified. IMPRESSION: Focal nodular wall thickening at the GE junction and gastric fundus. Gastric carcinoma cannot be excluded. Consider upper endoscopy or upper GI series for further evaluation. Colonic diverticulosis, without radiographic evidence of diverticulitis. Electronically Signed   By: Danae Orleans M.D.   On: 11/24/2022 10:30       Signed: Lula Olszewski, MD 02/07/2023 7:45 PM

## 2023-02-07 NOTE — Assessment & Plan Note (Signed)
Will recheck levels 

## 2023-02-07 NOTE — Assessment & Plan Note (Signed)
Declines surgery referral despite chronic abdomen pain associated with this 02/07/2023

## 2023-02-07 NOTE — Assessment & Plan Note (Signed)
She would like to try to come off omeprazole as much as possible even though the abdomen pain seems gastroesophageal reflux disease/hiatal hernia related

## 2023-02-07 NOTE — Assessment & Plan Note (Signed)
Must avoid NSAIDs due to abdominal intolerance. Therefore give hydrocodone as needed for abdomen pain in limited amounts.  The hand pain doesn't get that bad with plaquenil.

## 2023-02-08 LAB — LIPASE: Lipase: 14 U/L (ref 7–60)

## 2023-02-21 ENCOUNTER — Encounter: Payer: Self-pay | Admitting: Internal Medicine

## 2023-02-21 ENCOUNTER — Encounter (HOSPITAL_BASED_OUTPATIENT_CLINIC_OR_DEPARTMENT_OTHER): Payer: Self-pay | Admitting: Cardiovascular Disease

## 2023-02-21 NOTE — Telephone Encounter (Signed)
Please see pt msg and advise if patient should be seen at ED or UC due to symptoms and vitals

## 2023-03-22 DIAGNOSIS — H903 Sensorineural hearing loss, bilateral: Secondary | ICD-10-CM | POA: Diagnosis not present

## 2023-03-23 ENCOUNTER — Other Ambulatory Visit (HOSPITAL_BASED_OUTPATIENT_CLINIC_OR_DEPARTMENT_OTHER): Payer: Self-pay | Admitting: Family

## 2023-03-23 ENCOUNTER — Other Ambulatory Visit (HOSPITAL_BASED_OUTPATIENT_CLINIC_OR_DEPARTMENT_OTHER): Payer: Self-pay | Admitting: Cardiovascular Disease

## 2023-03-23 ENCOUNTER — Telehealth (HOSPITAL_BASED_OUTPATIENT_CLINIC_OR_DEPARTMENT_OTHER): Payer: Self-pay | Admitting: Cardiovascular Disease

## 2023-03-23 DIAGNOSIS — R002 Palpitations: Secondary | ICD-10-CM

## 2023-03-23 DIAGNOSIS — I10 Essential (primary) hypertension: Secondary | ICD-10-CM

## 2023-03-23 MED ORDER — LOSARTAN POTASSIUM 25 MG PO TABS
25.0000 mg | ORAL_TABLET | Freq: Every day | ORAL | 0 refills | Status: DC
Start: 2023-03-23 — End: 2023-08-09

## 2023-03-23 MED ORDER — METOPROLOL SUCCINATE ER 50 MG PO TB24
50.0000 mg | ORAL_TABLET | Freq: Every day | ORAL | 0 refills | Status: DC
Start: 2023-03-23 — End: 2023-04-30

## 2023-03-23 NOTE — Telephone Encounter (Signed)
*  STAT* If patient is at the pharmacy, call can be transferred to refill team.   1. Which medications need to be refilled? (please list name of each medication and dose if known)   metoprolol succinate (TOPROL-XL) 50 MG 24 hr tablet  losartan (COZAAR) 25 MG tablet   2. Which pharmacy/location (including street and city if local pharmacy) is medication to be sent to?  [id number] - [place], [place] - [address]   3. Do they need a 30 day or 90 day supply?   30 day  Patient stated she is almost out of this medication and will be going out of town.  Patient noted all future medication refills should go to [facility].

## 2023-03-23 NOTE — Telephone Encounter (Signed)
Rx request sent to pharmacy.  

## 2023-04-21 DIAGNOSIS — H903 Sensorineural hearing loss, bilateral: Secondary | ICD-10-CM | POA: Diagnosis not present

## 2023-04-30 ENCOUNTER — Other Ambulatory Visit (HOSPITAL_BASED_OUTPATIENT_CLINIC_OR_DEPARTMENT_OTHER): Payer: Self-pay | Admitting: Cardiovascular Disease

## 2023-04-30 DIAGNOSIS — R002 Palpitations: Secondary | ICD-10-CM

## 2023-04-30 DIAGNOSIS — I10 Essential (primary) hypertension: Secondary | ICD-10-CM

## 2023-05-02 MED ORDER — METOPROLOL SUCCINATE ER 50 MG PO TB24
50.0000 mg | ORAL_TABLET | Freq: Every day | ORAL | 3 refills | Status: DC
Start: 2023-05-02 — End: 2024-04-30

## 2023-05-05 ENCOUNTER — Encounter (INDEPENDENT_AMBULATORY_CARE_PROVIDER_SITE_OTHER): Payer: Self-pay

## 2023-05-05 ENCOUNTER — Encounter: Payer: Self-pay | Admitting: Internal Medicine

## 2023-05-05 ENCOUNTER — Ambulatory Visit (INDEPENDENT_AMBULATORY_CARE_PROVIDER_SITE_OTHER): Payer: Medicare HMO | Admitting: Internal Medicine

## 2023-05-05 VITALS — BP 134/72 | HR 63 | Temp 98.2°F | Ht 67.0 in | Wt 150.0 lb

## 2023-05-05 DIAGNOSIS — K2100 Gastro-esophageal reflux disease with esophagitis, without bleeding: Secondary | ICD-10-CM | POA: Diagnosis not present

## 2023-05-05 DIAGNOSIS — R7303 Prediabetes: Secondary | ICD-10-CM | POA: Diagnosis not present

## 2023-05-05 DIAGNOSIS — F418 Other specified anxiety disorders: Secondary | ICD-10-CM | POA: Diagnosis not present

## 2023-05-05 DIAGNOSIS — F5101 Primary insomnia: Secondary | ICD-10-CM | POA: Diagnosis not present

## 2023-05-05 DIAGNOSIS — R5383 Other fatigue: Secondary | ICD-10-CM | POA: Diagnosis not present

## 2023-05-05 DIAGNOSIS — R638 Other symptoms and signs concerning food and fluid intake: Secondary | ICD-10-CM | POA: Diagnosis not present

## 2023-05-05 LAB — COMPREHENSIVE METABOLIC PANEL
ALT: 12 U/L (ref 0–35)
AST: 19 U/L (ref 0–37)
Albumin: 4 g/dL (ref 3.5–5.2)
Alkaline Phosphatase: 88 U/L (ref 39–117)
BUN: 16 mg/dL (ref 6–23)
CO2: 26 mEq/L (ref 19–32)
Calcium: 9.9 mg/dL (ref 8.4–10.5)
Chloride: 102 mEq/L (ref 96–112)
Creatinine, Ser: 0.73 mg/dL (ref 0.40–1.20)
GFR: 81.13 mL/min (ref >60.00)
Glucose, Bld: 79 mg/dL (ref 70–99)
Potassium: 4 mEq/L (ref 3.5–5.1)
Sodium: 136 mEq/L (ref 135–145)
Total Bilirubin: 0.4 mg/dL (ref 0.2–1.2)
Total Protein: 6.9 g/dL (ref 6.0–8.3)

## 2023-05-05 LAB — CBC WITH DIFFERENTIAL/PLATELET
Basophils Absolute: 0 10*3/uL (ref 0.0–0.1)
Basophils Relative: 0.5 % (ref 0.0–3.0)
Eosinophils Absolute: 0.1 10*3/uL (ref 0.0–0.7)
Eosinophils Relative: 2.5 % (ref 0.0–5.0)
HCT: 37.7 % (ref 36.0–46.0)
Hemoglobin: 12.4 g/dL (ref 12.0–15.0)
Lymphocytes Relative: 47.1 % — ABNORMAL HIGH (ref 12.0–46.0)
Lymphs Abs: 2 10*3/uL (ref 0.7–4.0)
MCHC: 32.9 g/dL (ref 30.0–36.0)
MCV: 94.8 fl (ref 78.0–100.0)
Monocytes Absolute: 0.7 10*3/uL (ref 0.1–1.0)
Monocytes Relative: 16.1 % — ABNORMAL HIGH (ref 3.0–12.0)
Neutro Abs: 1.5 10*3/uL (ref 1.4–7.7)
Neutrophils Relative %: 33.8 % — ABNORMAL LOW (ref 43.0–77.0)
Platelets: 185 10*3/uL (ref 150.0–400.0)
RBC: 3.97 Mil/uL (ref 3.87–5.11)
RDW: 13.7 % (ref 11.5–15.5)
WBC: 4.3 10*3/uL (ref 4.0–10.5)

## 2023-05-05 LAB — VITAMIN D 25 HYDROXY (VIT D DEFICIENCY, FRACTURES): VITD: 33.61 ng/mL (ref 30.00–100.00)

## 2023-05-05 LAB — HEMOGLOBIN A1C: Hgb A1c MFr Bld: 5.4 % (ref 4.6–6.5)

## 2023-05-05 LAB — B12 AND FOLATE PANEL
Folate: 16.7 ng/mL (ref >5.9)
Vitamin B-12: 326 pg/mL (ref 211–911)

## 2023-05-05 LAB — TSH: TSH: 0.89 u[IU]/mL (ref 0.35–5.50)

## 2023-05-05 MED ORDER — MELATONIN 1 MG PO CAPS
1.0000 | ORAL_CAPSULE | Freq: Every evening | ORAL | 30 refills | Status: DC
Start: 2023-05-05 — End: 2023-06-30

## 2023-05-05 MED ORDER — BUSPIRONE HCL 5 MG PO TABS
5.0000 mg | ORAL_TABLET | Freq: Two times a day (BID) | ORAL | 2 refills | Status: DC
Start: 2023-05-05 — End: 2023-06-07

## 2023-05-05 NOTE — Patient Instructions (Addendum)
VISIT SUMMARY:  During your visit, we discussed your concerns about insomnia and fatigue. You mentioned that you have been having difficulty falling asleep and staying asleep, which has been affecting your energy levels. You also expressed a preference for natural remedies and lifestyle adjustments over medication for your insomnia. We also discussed your history of left ventricular hypertrophy, hypertension, arthritis, and stomach pain, which are currently under control.  YOUR PLAN:  -INSOMNIA: Insomnia is a sleep disorder that can make it hard to fall asleep, hard to stay asleep, or cause you to wake up too early and not be able to get back to sleep. We will start you on Melatonin, a hormone that your body produces naturally, to help with sleep initiation. We will also consider Buspirone, a medication used to treat anxiety, which may be contributing to your insomnia. It's important to continue good sleep hygiene practices, such as maintaining a regular sleep schedule and creating a comfortable sleep environment. We also recommend considering mindfulness exercises, which can help reduce stress and improve sleep.  -FATIGUE: Fatigue is a feeling of constant tiredness or weakness, which can be physical, mental or both. It could be related to your insomnia. We will order a series of blood tests to check for any metabolic or nutritional causes of your fatigue. These tests will check your overall metabolic function, blood cell counts, thyroid function, blood sugar levels, and levels of certain vitamins.  INSTRUCTIONS:  We have ordered a series of blood tests for you. Please make sure to get these tests done as soon as possible. We have also scheduled a follow-up appointment in one month to assess your response to the interventions and to discuss the results of your lab tests. In the meantime, start taking Melatonin for sleep initiation and continue practicing good sleep hygiene. If you have any questions or  concerns, please don't hesitate to contact us.  Here's a plan to address your insomnia:  1. Sleep Hygiene Education:    a) Consistent sleep schedule: Go to bed and wake up at the same time every day, even on weekends.    b) Create a relaxing bedtime routine: Incorporate calming activities like reading, listening to soft music, or gentle stretching.    c) Optimize sleep environment: Ensure the bedroom is dark, quiet, and cool.    d) Limit screen time: Avoid electronic devices for at least 1 hour before bed due to blue light exposure.    e) Use the bed only for sleep and intimacy: This helps associate the bed with sleep.  2. Cognitive Behavioral Therapy for Insomnia (CBT-I):    a) Stimulus control: Only go to bed when sleepy; if unable to sleep after 20 minutes, get up and do a calm activity until feeling sleepy.    b) Sleep restriction: Temporarily restrict time in bed to increase sleep efficiency, then gradually increase as sleep improves.    c) Cognitive restructuring: Address and reframe negative thoughts about sleep.    d) Relaxation techniques: Practice progressive muscle relaxation, deep breathing exercises, or meditation.  3. Manage Racing Thoughts:    a) Journaling: Write down thoughts and worries before bedtime to clear the mind.    b) Scheduled worry time: Set aside time earlier in the day to address concerns.    c) Mindfulness practices: Learn to observe thoughts without judgment.  4. Lifestyle Adjustments:    a) Exercise: Engage in regular physical activity, but not too close to bedtime.    b) Light exposure: Get natural  sunlight during the day to regulate circadian rhythm.    c) Avoid large meals, alcohol, and nicotine close to bedtime.  5. Continue Helpful Practices:    a) Music: Incorporate calming music into the bedtime routine.    b) Bible reading: Continue this practice as part of the wind-down routine.  6. Sleep Diary:    Maintain a sleep diary to track sleep  patterns, habits, and improvements.  7. Natural Sleep Aids:    a) Herbal teas: Chamomile, valerian root, or passionflower tea before bed.    b) Essential oils: Lavender or chamomile for aromatherapy.    c) Magnesium supplements: Consult with a healthcare provider about potential benefits.  Recommended Clinical Tests:  1. Sleep Study (Polysomnography):     Given the patient's snoring, this test can rule out sleep apnea and identify other sleep disorders.  2. Actigraphy:     A non-invasive way to assess sleep-wake patterns over an extended period.  3. Thyroid Function Tests:     To rule out thyroid issues that can contribute to sleep problems.  4. Complete Blood Count (CBC):     To check for underlying health issues like anemia that might affect sleep.  5. Psychological Evaluation:     To assess for anxiety, depression, or other mental health concerns that may be contributing to insomnia.  Implementation Plan:  1. Initial consultation: Discuss the education plan and answer any questions. 2. Weekly follow-ups: Review progress, adjust strategies as needed. 3. Bi-weekly sleep diary review: Analyze sleep patterns and improvements. 4. Monthly reassessment: Evaluate overall progress and adjust the plan if necessary. 5. Referrals: If needed, consider referrals to a sleep specialist, psychologist, or other relevant healthcare providers.  This comprehensive approach focuses on natural methods to improve sleep quality while addressing the patient's specific concerns and preferences. The recommended clinical tests can help rule out underlying health issues and provide a more complete picture of her sleep health.

## 2023-05-05 NOTE — Assessment & Plan Note (Addendum)
Wants to avoid medicine. Created and reviewed AVS plan. Seems stable, due to concern(s) for sister. She experiences difficulty falling asleep and frequent awakenings, without any associated snoring or apnea. she prefers non-pharmacological interventions and maintains good sleep hygiene. We will start Melatonin for sleep initiation and consider Buspirone for underlying anxiety contributing to his insomnia. Encouragement for the continuation of sleep hygiene practices and consideration of mindfulness exercises are also part of the plan.

## 2023-05-05 NOTE — Progress Notes (Signed)
[facility]: [phone]   Routine Medical Office Visit  Patient:  [name]      Age: 72 y.o.       Sex:  female  Date:   05/05/2023 Patient Care Team: Lula Olszewski, MD as PCP - General (Internal Medicine) Chilton Si, MD as PCP - Cardiology (Cardiology) Zenovia Jordan, MD as Consulting Physician (Rheumatology) Meredith Pel, NP as Nurse Practitioner (Gastroenterology) Betha Loa, MD as Consulting Physician (Orthopedic Surgery) Rachel Moulds, MD as Consulting Physician (Hematology and Oncology) Meryl Dare, MD as Consulting Physician (Gastroenterology) Today's Healthcare Provider: Lula Olszewski, MD   Assessment and Plan:   [name] was seen today for fatigue.  Other fatigue -     busPIRone HCl; Take 1 tablet (5 mg total) by mouth 2 (two) times daily.  Dispense: 60 tablet; Refill: 2 -     TSH -     Comprehensive metabolic panel -     CBC with Differential/Platelet -     Hemoglobin A1c -     B12 and Folate Panel -     VITAMIN D 25 Hydroxy (Vit-D Deficiency, Fractures)  Primary insomnia Overview: Flared with seeing sister summer 2024 Difficulty falling asleep and waking up She snores but not aware of apnea-never had sleep study She hasn't tried medication(s) Getting about 4 hours nightly No caffeine relationship Doesn't seem related with arthritis Is associated with racing thoughts. Music and bible helps.    Assessment & Plan: Wants to avoid medicine  Orders: -     Melatonin; Take 1 capsule (1 mg total) by mouth at bedtime.  Dispense: 90 capsule; Refill: 30  Gastroesophageal reflux disease with esophagitis without hemorrhage Overview: Associated with hiatal hernia Takes occasional as needed omeprazole burst. Encouraged try pepcid complete she not taking except occasional as needed, same with Maalox Stomach pain resolved.   Other specified anxiety disorders -     busPIRone HCl; Take 1 tablet (5 mg total)  by mouth 2 (two) times daily.  Dispense: 60 tablet; Refill: 2  Weight disorder  Prediabetes Overview: Lab Results  Component Value Date   HGBA1C 5.7 (H) 06/29/2021     Orders: -     Hemoglobin A1c    Assessment & Plan Other fatigue Possibly due to inadequate sleep and worry sHe reports chronic fatigue, possibly related to his insomnia, without any associated pain or discomfort. We will order a comprehensive metabolic panel, complete blood count, thyroid function tests, A1c, Vitamin B12, Folate, and Vitamin D levels to rule out metabolic or nutritional causes. A follow-up in one month is scheduled to assess the response to interventions and lab results.  Primary insomnia Wants to avoid medicine. Created and reviewed AVS plan. Seems stable, due to concern(s) for sister. She experiences difficulty falling asleep and frequent awakenings, without any associated snoring or apnea. she prefers non-pharmacological interventions and maintains good sleep hygiene. We will start Melatonin for sleep initiation and consider Buspirone for underlying anxiety contributing to his insomnia. Encouragement for the continuation of sleep hygiene practices and consideration of mindfulness exercises are also part of the plan. Gastroesophageal reflux disease with esophagitis without hemorrhage  Other specified anxiety disorders Despite scoring on General Anxiety Disorder - 7 question screening survey  Weight disorder  Prediabetes     Orders          Ordered    Melatonin 1 MG CAPS  Nightly        05/05/23 1144    busPIRone (BUSPAR)  5 MG tablet  2 times daily        05/05/23 1144    TSH        05/05/23 1144    Comprehensive metabolic panel        05/05/23 1144    CBC with Differential/Platelet        05/05/23 1144    HgB A1c        05/05/23 1144    B12 and Folate Panel        05/05/23 1144    Vitamin D (25 hydroxy)        05/05/23 1144            Recommended follow up: No follow-ups on  file.  Future Appointments  Date Time Provider Department Center  06/07/2023 10:40 AM Lula Olszewski, MD [facility]  06/30/2023  2:45 PM Rachel Moulds, MD [facility] None  07/13/2023 10:30 AM [facility] 2 [facility]  08/10/2023 10:20 AM Lula Olszewski, MD [facility]  01/24/2024  8:15 AM [facility] ANNUAL WELLNESS VISIT 1 [facility]           Clinical Presentation:    72 y.o. 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, without heart failure; Thiamine deficiency neuropathy; Weight disorder; Insomnia; Lower extremity edema; Primary localized osteoarthrosis of multiple sites; Rheumatoid arthritis (HCC); GERD (gastroesophageal reflux disease); Prediabetes; Difficulty standing; Hand arthritis; Aortic atherosclerosis (HCC); Diverticular disease; Upper abdominal pain; Chronic idiopathic constipation; DDD (degenerative disc disease), thoracic; Elevated amylase; Abnormal finding on imaging of liver; Intractable abdominal pain; Gastric wall thickening; and Chronic kidney disease, stage 2 (mild) on their problem list. Her reasons/main concerns/chief complaints for today's office visit are Fatigue (Going on for one month )   AI-Extracted: Discussed the use of AI scribe software for clinical note transcription with the patient, who gave verbal consent to proceed.  History of Present Illness   The patient presents with a primary concern of fatigue and insomnia.  reports that these symptoms have been ongoing for a significant period of time, with insomnia worsening after a visit to his sister approximately a month  ago. The patient describes difficulty falling asleep and frequent awakenings during the night. reports an average of four hours of sleep per night, which she describes as insufficient. mentions that he has been snoring since childhood, but has never undergone a sleep study. The patient prefers not to take medications and has not tried any for his sleep disorder.  The patient also reports a history of left ventricular hypertrophy (LVH), but expresses a desire to discuss this with her cardiologist at an upcoming appointment. He also mentions a history of hypertension without heart disease.  In terms of his fatigue, the patient does not attribute it to any specific cause. sHe expresses a desire for blood work to investigate potential deficiencies that could be contributing to his fatigue. He also mentions a history of arthritis, primarily affecting his hands, but does not believe this is related to his sleep issues.  The patient also reports a history of stomach pain, which has since resolved after treatment with Maalox and Pepcid AC. sHe no longer takes these medications regularly, only as needed for  occasional symptoms. sHe also mentions taking omeprazole when he feels gassy.  The patient expresses a preference for natural remedies and lifestyle adjustments over medication for his insomnia. sHe mentions using music and Bible readings as calming activities before bed. However, she also expresses a willingness to try medication temporarily if it will help his sleep issues. He expresses concern about his sister, but does not wish to take medication to alleviate this worry.  The patient has a history of obesity, but he reports a recent weight loss down to 181 pounds. He questions whether he is still considered obese. He also mentions a history of anxiety, which his daughter, a Engineer, civil (consulting), has suggested may be contributing to his insomnia. The patient is open to trying medication for anxiety, but expresses a desire  to avoid any medication that could be addictive or have long-term side effects.      She  has a past medical history of Acute pain of right shoulder (11/01/2022), Aortic atherosclerosis (HCC) (11/01/2022), Arthritis, Back pain, Blood transfusion, Breast cancer (HCC) (2009), Cataract, Chicken pox, Colon cancer (HCC) (2003), Depression, Difficulty standing (09/08/2022), Diverticular disease (11/01/2022), Diverticulitis, Edema of both ankles, Family history of breast cancer (10/24/2017), Flank pain (11/03/2007), Gallbladder problem, Gastritis, Generalized abdominal pain (12/15/2022), GERD (gastroesophageal reflux disease), Hepatitis, Hiatal hernia, History of stomach ulcers, Hypertension, Joint pain, Lactose intolerance, Lower extremity edema (01/11/2022), Lupus (HCC), Multiple gastric ulcers, Palpitations, Personal history of radiation therapy (2009), Prediabetes (09/08/2022), Rheumatic fever, Rheumatoid arthritis (HCC), SLE (systemic lupus erythematosus related syndrome) (HCC), Urinary incontinence, and UTI (urinary tract infection).  Problem overviews that were updated today: Problem  Gerd (Gastroesophageal Reflux Disease)   Associated with hiatal hernia Takes occasional as needed omeprazole burst. Encouraged try pepcid complete she not taking except occasional as needed, same with Maalox Stomach pain resolved.   Weight Disorder  Insomnia   Flared with seeing sister summer 2024 Difficulty falling asleep and waking up She snores but not aware of apnea-never had sleep study She hasn't tried medication(s) Getting about 4 hours nightly No caffeine relationship Doesn't seem related with arthritis Is associated with racing thoughts. Music and bible helps.       Current Outpatient Medications on File Prior to Visit  Medication Sig   [facility] Fiber Gummy Bears Children CHEW Chew 20 g by mouth daily at 6 (six) AM.   famotidine-calcium carbonate-magnesium hydroxide (PEPCID COMPLETE) 10-800-165 MG  chewable tablet Chew 1 tablet by mouth 2 (two) times daily as needed.   hydrochlorothiazide (HYDRODIURIL) 25 MG tablet TAKE 1 TABLET (25 MG TOTAL) BY MOUTH DAILY.   hydroxychloroquine (PLAQUENIL) 200 MG tablet Take 200 mg by mouth 2 (two) times daily.   losartan (COZAAR) 25 MG tablet Take 1 tablet (25 mg total) by mouth daily.   metoprolol succinate (TOPROL-XL) 50 MG 24 hr tablet Take 1 tablet (50 mg total) by mouth daily.   Multiple Vitamins-Minerals (EMERGEN-C IMMUNE PLUS PO) Take 1 tablet by mouth daily.   omeprazole (PRILOSEC) 20 MG capsule Take 1 capsule (20 mg total) by mouth daily.   potassium chloride (KLOR-CON) 10 MEQ tablet TAKE 1 TABLET BY MOUTH EVERY DAY   No current facility-administered medications on file prior to visit.   Medications Discontinued During This Encounter  Medication Reason   HYDROcodone-acetaminophen (NORCO/VICODIN) 5-325 MG tablet          Clinical Data Analysis:   Physical Exam  BP 134/72   Pulse 63   Temp 98.2 F (36.8 C) (Temporal)   Ht 5\' 7"  (  1.702 m)   Wt 150 lb (68 kg)   SpO2 99%   BMI 23.49 kg/m  Wt Readings from Last 10 Encounters:  05/05/23 150 lb (68 kg)  02/07/23 180 lb 6.4 oz (81.8 kg)  01/20/23 185 lb (83.9 kg)  01/12/23 185 lb (83.9 kg)  01/04/23 185 lb (83.9 kg)  12/29/22 186 lb 1.6 oz (84.4 kg)  12/15/22 183 lb 6.4 oz (83.2 kg)  11/30/22 183 lb 3.2 oz (83.1 kg)  11/22/22 186 lb 3.2 oz (84.5 kg)  11/01/22 180 lb 3.2 oz (81.7 kg)   Vital signs reviewed.  Nursing notes reviewed. Weight trend reviewed. Abnormalities and Problem-Specific physical exam findings:  weight loss noted.  Hardly overweight anymore.  General Appearance:  No acute distress appreciable.   Well-groomed, healthy-appearing female.  Well proportioned with no abnormal fat distribution.  Good muscle tone. Skin: Clear and well-hydrated. Pulmonary:  Normal work of breathing at rest, no respiratory distress apparent. SpO2: 99 %  Musculoskeletal: All extremities are  intact.  Neurological:  Awake, alert, oriented, and engaged.  No obvious focal neurological deficits or cognitive impairments.  Sensorium seems unclouded.   Speech is clear and coherent with logical content. Psychiatric:  Appropriate mood, pleasant and cooperative demeanor, thoughtful and engaged during the exam  Results Reviewed:     Results for orders placed or performed in visit on 05/05/23  TSH  Result Value Ref Range   TSH 0.89 0.35 - 5.50 uIU/mL  Comprehensive metabolic panel  Result Value Ref Range   Sodium 136 135 - 145 mEq/L   Potassium 4.0 3.5 - 5.1 mEq/L   Chloride 102 96 - 112 mEq/L   CO2 26 19 - 32 mEq/L   Glucose, Bld 79 70 - 99 mg/dL   BUN 16 6 - 23 mg/dL   Creatinine, Ser 1.91 0.40 - 1.20 mg/dL   Total Bilirubin 0.4 0.2 - 1.2 mg/dL   Alkaline Phosphatase 88 39 - 117 U/L   AST 19 0 - 37 U/L   ALT 12 0 - 35 U/L   Total Protein 6.9 6.0 - 8.3 g/dL   Albumin 4.0 3.5 - 5.2 g/dL   GFR 47.82 >95.62 mL/min   Calcium 9.9 8.4 - 10.5 mg/dL  CBC with Differential/Platelet  Result Value Ref Range   WBC 4.3 4.0 - 10.5 K/uL   RBC 3.97 3.87 - 5.11 Mil/uL   Hemoglobin 12.4 12.0 - 15.0 g/dL   HCT 13.0 86.5 - 78.4 %   MCV 94.8 78.0 - 100.0 fl   MCHC 32.9 30.0 - 36.0 g/dL   RDW 69.6 29.5 - 28.4 %   Platelets 185.0 150.0 - 400.0 K/uL   Neutrophils Relative % 33.8 (L) 43.0 - 77.0 %   Lymphocytes Relative 47.1 (H) 12.0 - 46.0 %   Monocytes Relative 16.1 (H) 3.0 - 12.0 %   Eosinophils Relative 2.5 0.0 - 5.0 %   Basophils Relative 0.5 0.0 - 3.0 %   Neutro Abs 1.5 1.4 - 7.7 K/uL   Lymphs Abs 2.0 0.7 - 4.0 K/uL   Monocytes Absolute 0.7 0.1 - 1.0 K/uL   Eosinophils Absolute 0.1 0.0 - 0.7 K/uL   Basophils Absolute 0.0 0.0 - 0.1 K/uL  HgB A1c  Result Value Ref Range   Hgb A1c MFr Bld 5.4 4.6 - 6.5 %  B12 and Folate Panel  Result Value Ref Range   Vitamin B-12 326 211 - 911 pg/mL   Folate 16.7 >5.9 ng/mL  Vitamin D (25 hydroxy)  Result Value Ref Range   VITD 33.61 30.00 -  100.00 ng/mL    Office Visit on 05/05/2023  Component Date Value   TSH 05/05/2023 0.89    Sodium 05/05/2023 136    Potassium 05/05/2023 4.0    Chloride 05/05/2023 102    CO2 05/05/2023 26    Glucose, Bld 05/05/2023 79    BUN 05/05/2023 16    Creatinine, Ser 05/05/2023 0.73    Total Bilirubin 05/05/2023 0.4    Alkaline Phosphatase 05/05/2023 88    AST 05/05/2023 19    ALT 05/05/2023 12    Total Protein 05/05/2023 6.9    Albumin 05/05/2023 4.0    GFR 05/05/2023 81.13    Calcium 05/05/2023 9.9    WBC 05/05/2023 4.3    RBC 05/05/2023 3.97    Hemoglobin 05/05/2023 12.4    HCT 05/05/2023 37.7    MCV 05/05/2023 94.8    MCHC 05/05/2023 32.9    RDW 05/05/2023 13.7    Platelets 05/05/2023 185.0    Neutrophils Relative % 05/05/2023 33.8 (L)    Lymphocytes Relative 05/05/2023 47.1 (H)    Monocytes Relative 05/05/2023 16.1 (H)    Eosinophils Relative 05/05/2023 2.5    Basophils Relative 05/05/2023 0.5    Neutro Abs 05/05/2023 1.5    Lymphs Abs 05/05/2023 2.0    Monocytes Absolute 05/05/2023 0.7    Eosinophils Absolute 05/05/2023 0.1    Basophils Absolute 05/05/2023 0.0    Hgb A1c MFr Bld 05/05/2023 5.4    Vitamin B-12 05/05/2023 326    Folate 05/05/2023 16.7    VITD 05/05/2023 33.61   Office Visit on 02/07/2023  Component Date Value   Sodium 02/07/2023 140    Potassium 02/07/2023 3.9    Chloride 02/07/2023 103    CO2 02/07/2023 28    Glucose, Bld 02/07/2023 69 (L)    BUN 02/07/2023 10    Creatinine, Ser 02/07/2023 0.82    Total Bilirubin 02/07/2023 0.4    Alkaline Phosphatase 02/07/2023 81    AST 02/07/2023 22    ALT 02/07/2023 11    Total Protein 02/07/2023 6.7    Albumin 02/07/2023 3.9    GFR 02/07/2023 70.68    Calcium 02/07/2023 9.9    Amylase 02/07/2023 57    Lipase 02/07/2023 14   Office Visit on 11/30/2022  Component Date Value   H pylori Ag, Stl 12/13/2022 Negative   Scanned Document on 11/16/2022  Component Date Value   EGFR 11/16/2022 77.0   Orders Only  on 11/15/2022  Component Date Value   Amylase 11/15/2022 125 (H)    Lipase 11/15/2022 30   Appointment on 09/08/2022  Component Date Value   WBC 09/08/2022 4.4    RBC 09/08/2022 4.16    Platelets 09/08/2022 214.0    Hemoglobin 09/08/2022 12.9    HCT 09/08/2022 39.1    MCV 09/08/2022 93.9    MCHC 09/08/2022 32.9    RDW 09/08/2022 13.9    Sodium 09/08/2022 139    Potassium 09/08/2022 4.0    Chloride 09/08/2022 104    CO2 09/08/2022 28    Glucose, Bld 09/08/2022 86    BUN 09/08/2022 12    Creatinine, Ser 09/08/2022 0.69    Total Bilirubin 09/08/2022 0.5    Alkaline Phosphatase 09/08/2022 63    AST 09/08/2022 26    ALT 09/08/2022 20    Total Protein 09/08/2022 7.2    Albumin 09/08/2022 4.0    GFR 09/08/2022 86.01    Calcium 09/08/2022 10.0  Color, Urine 09/08/2022 YELLOW    APPearance 09/08/2022 CLEAR    Specific Gravity, Urine 09/08/2022 1.015    pH 09/08/2022 6.5    Total Protein, Urine 09/08/2022 NEGATIVE    Urine Glucose 09/08/2022 NEGATIVE    Ketones, ur 09/08/2022 NEGATIVE    Bilirubin Urine 09/08/2022 NEGATIVE    Hgb urine dipstick 09/08/2022 NEGATIVE    Urobilinogen, UA 09/08/2022 1.0    Leukocytes,Ua 09/08/2022 NEGATIVE    Nitrite 09/08/2022 NEGATIVE    WBC, UA 09/08/2022 0-2/hpf    RBC / HPF 09/08/2022 0-2/hpf    Squamous Epithelial / HPF 09/08/2022 Rare(0-4/hpf)    Renal Epithel, UA 09/08/2022 None Seen    Bacteria, UA 09/08/2022 Rare(<10/hpf) (A)    Hgb A1c MFr Bld 09/08/2022 5.6   Telephone on 07/01/2022  Component Date Value   Glucose 07/14/2022 100 (H)    BUN 07/14/2022 19    Creatinine, Ser 07/14/2022 0.66    eGFR 07/14/2022 93    BUN/Creatinine Ratio 07/14/2022 29 (H)    Sodium 07/14/2022 141    Potassium 07/14/2022 4.0    Chloride 07/14/2022 103    CO2 07/14/2022 22    Calcium 07/14/2022 9.9   Appointment on 06/29/2022  Component Date Value   Specimen Description 06/29/2022                     Value:URINE, CLEAN CATCH Performed at [facility], [address] [place], [place] [address]    Special Requests 06/29/2022                     Value:NONE Performed at [facility], [address] [place], [place] [address]    Culture 06/29/2022 MULTIPLE SPECIES PRESENT, SUGGEST RECOLLECTION (A)    Report Status 06/29/2022 06/30/2022 FINAL    Color, Urine 06/29/2022 YELLOW    APPearance 06/29/2022 CLEAR    Specific Gravity, Urine 06/29/2022 1.021    pH 06/29/2022 6.0    Glucose, UA 06/29/2022 NEGATIVE    Hgb urine dipstick 06/29/2022 NEGATIVE    Bilirubin Urine 06/29/2022 NEGATIVE    Ketones, ur 06/29/2022 NEGATIVE    Protein, ur 06/29/2022 NEGATIVE    Nitrite 06/29/2022 NEGATIVE    Leukocytes,Ua 06/29/2022 NEGATIVE    RBC / HPF 06/29/2022 0-5    WBC, UA 06/29/2022 0-5    Bacteria, UA 06/29/2022 NONE SEEN    Squamous Epithelial / HPF 06/29/2022 0-5    Sodium 06/29/2022 137    Potassium 06/29/2022 3.3 (L)    Chloride 06/29/2022 105    CO2 06/29/2022 29    Glucose, Bld 06/29/2022 71    BUN 06/29/2022 20    Creatinine 06/29/2022 0.78    Calcium 06/29/2022 9.7    Total Protein 06/29/2022 7.6    Albumin 06/29/2022 3.9    AST 06/29/2022 22    ALT 06/29/2022 16    Alkaline Phosphatase 06/29/2022 70    Total Bilirubin 06/29/2022 0.5    GFR, Estimated 06/29/2022 >60    Anion gap 06/29/2022 3 (L)    WBC Count 06/29/2022 4.9    RBC 06/29/2022 4.04    Hemoglobin 06/29/2022 12.7    HCT 06/29/2022 38.2    MCV 06/29/2022 94.6    MCH 06/29/2022 31.4    MCHC 06/29/2022 33.2    RDW 06/29/2022 12.7    Platelet Count 06/29/2022 199    nRBC 06/29/2022 0.0    Neutrophils Relative % 06/29/2022 32    Neutro Abs  06/29/2022 1.6 (L)    Lymphocytes Relative 06/29/2022 47    Lymphs Abs 06/29/2022 2.3    Monocytes Relative 06/29/2022 13    Monocytes Absolute 06/29/2022 0.6    Eosinophils Relative 06/29/2022 7    Eosinophils Absolute 06/29/2022 0.3    Basophils Relative 06/29/2022  1    Basophils Absolute 06/29/2022 0.0    Immature Granulocytes 06/29/2022 0    Abs Immature Granulocytes 06/29/2022 0.01   Admission on 06/04/2022, Discharged on 06/05/2022  Component Date Value   Sodium 06/04/2022 136    Potassium 06/04/2022 3.4 (L)    Chloride 06/04/2022 101    CO2 06/04/2022 24    Glucose, Bld 06/04/2022 118 (H)    BUN 06/04/2022 13    Creatinine, Ser 06/04/2022 0.85    Calcium 06/04/2022 10.3    GFR, Estimated 06/04/2022 >60    Anion gap 06/04/2022 11    WBC 06/04/2022 9.5    RBC 06/04/2022 4.06    Hemoglobin 06/04/2022 12.7    HCT 06/04/2022 37.2    MCV 06/04/2022 91.6    MCH 06/04/2022 31.3    MCHC 06/04/2022 34.1    RDW 06/04/2022 12.4    Platelets 06/04/2022 188    nRBC 06/04/2022 0.0    Troponin I (High Sensiti* 06/04/2022 4    Lipase 06/05/2022 <10 (L)    Total Protein 06/05/2022 6.8    Albumin 06/05/2022 3.9    AST 06/05/2022 19    ALT 06/05/2022 12    Alkaline Phosphatase 06/05/2022 67    Total Bilirubin 06/05/2022 0.4    Bilirubin, Direct 06/05/2022 0.1    Indirect Bilirubin 06/05/2022 0.3    Total CK 06/05/2022 115    Lactic Acid, Venous 06/05/2022 0.9    SARS Coronavirus 2 by RT* 06/05/2022 NEGATIVE    No image results found.   No results found.  CT ABDOMEN PELVIS W WO CONTRAST  Result Date: 11/24/2022 CLINICAL DATA:  Abdominal and epigastric pain for several months. Elevated amylase. EXAM: CT ABDOMEN AND PELVIS WITHOUT AND WITH CONTRAST TECHNIQUE: Multidetector CT imaging of the abdomen and pelvis was performed following the standard protocol before and following the bolus administration of intravenous contrast. RADIATION DOSE REDUCTION: This exam was performed according to the departmental dose-optimization program which includes automated exposure control, adjustment of the mA and/or kV according to patient size and/or use of iterative reconstruction technique. CONTRAST:  OMNIPAQUE IOHEXOL 300 MG/ML  SOLN COMPARISON:  Noncontrast  CT on 07/16/2021 FINDINGS: Lower Chest: No acute findings. Hepatobiliary: A few tiny sub-cm low attention lesions are too small to characterize, but most likely represent tiny cysts. 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 No suspicious hepatic masses are identified. Prior cholecystectomy. No evidence of biliary obstruction. Pancreas: No evidence of pancreatic mass or ductal dilatation. No radiographic signs of pancreatitis. Spleen: Within normal limits in size and appearance. Adrenals/Urinary Tract: No suspicious masses identified. No evidence of ureteral calculi or hydronephrosis. Stomach/Bowel: Focal nodular wall thickening is seen at the GE junction and gastric fundus and gastric carcinoma cannot be excluded. Remainder of the stomach is unremarkable. No evidence of obstruction, inflammatory process or abnormal fluid collections. Diverticulosis is seen mainly involving the descending and sigmoid colon, however there is no evidence of diverticulitis. Vascular/Lymphatic: No pathologically enlarged lymph nodes. No acute vascular findings. Reproductive: Prior hysterectomy noted. Adnexal regions are unremarkable in appearance. Other:  None. Musculoskeletal:  No suspicious bone lesions identified. IMPRESSION: Focal  nodular wall thickening at the GE junction and gastric fundus. Gastric carcinoma cannot be excluded. Consider upper endoscopy or upper GI series for further evaluation. Colonic diverticulosis, without radiographic evidence of diverticulitis. Electronically Signed   By: Danae Orleans M.D.   On: 11/24/2022 10:30      This encounter employed real-time, collaborative documentation. The patient actively reviewed and updated their medical record on a shared screen, ensuring transparency and facilitating joint problem-solving for the problem list, overview, and plan. This approach promotes accurate, informed care. The treatment plan was discussed  and reviewed in detail, including medication safety, potential side effects, and all patient questions. We confirmed understanding and comfort with the plan. Follow-up instructions were established, including contacting the office for any concerns, returning if symptoms worsen, persist, or new symptoms develop, and precautions for potential emergency department visits. ----------------------------------------------------- Lula Olszewski, MD  05/05/2023 8:19 PM  [facility]:  [phone]

## 2023-05-06 ENCOUNTER — Encounter: Payer: Self-pay | Admitting: Internal Medicine

## 2023-05-06 ENCOUNTER — Telehealth: Payer: Self-pay | Admitting: Internal Medicine

## 2023-05-06 NOTE — Telephone Encounter (Signed)
Labs has been faxed and received confirmation

## 2023-05-06 NOTE — Telephone Encounter (Signed)
Patient requests Lab results be faxed to Dr. Orlin Hilding (Rheumatology) at Fax# [phone]

## 2023-05-10 ENCOUNTER — Encounter (HOSPITAL_BASED_OUTPATIENT_CLINIC_OR_DEPARTMENT_OTHER): Payer: Self-pay | Admitting: Family

## 2023-05-10 ENCOUNTER — Ambulatory Visit (HOSPITAL_BASED_OUTPATIENT_CLINIC_OR_DEPARTMENT_OTHER): Payer: Medicare HMO | Admitting: Family

## 2023-05-10 VITALS — BP 120/70 | HR 65 | Ht 67.0 in | Wt 184.0 lb

## 2023-05-10 DIAGNOSIS — E6609 Other obesity due to excess calories: Secondary | ICD-10-CM

## 2023-05-10 DIAGNOSIS — Z6832 Body mass index (BMI) 32.0-32.9, adult: Secondary | ICD-10-CM

## 2023-05-10 DIAGNOSIS — R002 Palpitations: Secondary | ICD-10-CM | POA: Diagnosis not present

## 2023-05-10 DIAGNOSIS — I10 Essential (primary) hypertension: Secondary | ICD-10-CM

## 2023-05-10 DIAGNOSIS — I700 Atherosclerosis of aorta: Secondary | ICD-10-CM | POA: Diagnosis not present

## 2023-05-10 DIAGNOSIS — Z8249 Family history of ischemic heart disease and other diseases of the circulatory system: Secondary | ICD-10-CM

## 2023-05-10 NOTE — Progress Notes (Signed)
Cardiology Office Note:  .   Date:  05/13/2023  ID:  Chynia, Brazelton 1950/11/20, MRN [id number] PCP: Lula Olszewski, MD   [facility] Providers Cardiologist:  Chilton Si, MD    History of Present Illness: .   [name] is a 72 y.o. female with a hx of SLE, HTN, prior breast cancer, PVC, PAC, aortic atherosclerosis.   Initial evaluation 06/2018 for shortness of breath and  palpitations. Monitor 06/2018 occasional PVC and PAC. Repeat monitor 01/2021 with PACs and PVCs. Patient triggered episodes associated with NSR.    Seen 01/11/22. She had a goal to be off some BP medications but as BP remained above goal HCTZ, Losartan, Metoprolol were continued. At visit 04/12/22 home BP cuff found to be accurate with home readings 110s/70s and present regimen continued. At last visit 07/2022 Metoprolol adjusted to 50mg  daily.    She presents today for follow-up independently.  Recently went to visit [place] to visit her sister. Her sister has had some memory changes and she is planning to move her in with her. Notes she had a two week period recently when her blood pressure was high in the 170s. When she saw her primary care it had normalized. Blood work was reassuring by her report. Occasional palpitations.  Does note trouble falling and staying asleep and more anxiety, recently started on Buspar by primary care. Notes some generalized activity intolerance which she is not sure is related to recent stressor or alternate etiology.  ROS: Please see the history of present illness.    All other systems reviewed and are negative.   Studies Reviewed: Marland Kitchen   EKG Interpretation Date/Time:  Tuesday May 10 2023 10:38:44 EDT Ventricular Rate:  65 PR Interval:  192 QRS Duration:  90 QT Interval:  418 QTC Calculation: 434 R Axis:   25  Text Interpretation: Normal sinus rhythm Confirmed by Gillian Shields ([id number]) on 05/10/2023 10:44:40 AM    Cardiac Studies & Procedures        ECHOCARDIOGRAM  ECHOCARDIOGRAM COMPLETE 07/20/2018  Narrative *[facility]* [address] [place], [place] [address] [phone]  ------------------------------------------------------------------- Transthoracic Echocardiography  Patient:    Jaclyn, Nguyen MR #:       [id number] Study Date: 07/20/2018 Gender:     F Age:        66 Height:     167.6 cm Weight:     97.1 kg BSA:        2.16 m^2 Pt. Status: Room:  ATTENDING    Armanda Magic, MD SONOGRAPHER  Cathie Beams PERFORMING   [facility] Out[facility] ORDERING     Chilton Si, MD REFERRING    Chilton Si, MD  cc:  ------------------------------------------------------------------- LV EF: 55% -   60%  ------------------------------------------------------------------- Indications:      R00.2 Palpitations. R06.02 Shortness of breath.  ------------------------------------------------------------------- History:   PMH:  Edema. Leg pain. Murmur. Lupus. Breast cancer. Risk factors:  Hypertension.  ------------------------------------------------------------------- Study Conclusions  - Left ventricle: The cavity size was normal. There was severe focal basal and mild concentric hypertrophy. Systolic function was normal. The estimated ejection fraction was in the range of 55% to 60%. Wall motion was normal; there were no regional wall motion abnormalities. There was an increased relative contribution of atrial contraction to ventricular filling. Doppler parameters are consistent with abnormal left ventricular relaxation (grade 1 diastolic dysfunction). - Aortic valve: Trileaflet; normal thickness, mildly calcified leaflets. There was mild regurgitation. - Mitral valve: There was trivial regurgitation.  Valve area by pressure half-time: 2.06 cm^2. - Tricuspid valve: There was trivial regurgitation.  ------------------------------------------------------------------- Study data:  Comparison was made  to the study of 10/06/2014.  Study status:  Routine.  Procedure:  The patient reported no pain pre or post test. Transthoracic echocardiography. Image quality was adequate.  Study completion:  There were no complications. Transthoracic echocardiography.  M-mode, complete 2D, spectral Doppler, and color Doppler.  Birthdate:  Patient birthdate: [birth year].  Age:  Patient is 72 yr old.  Sex:  Gender: female. BMI: 34.6 kg/m^2.  Blood pressure:     136/85  Patient status: Outpatient.  Study date:  Study date: 07/20/2018. Study time: 09:29 AM.  Location:  [facility]  -------------------------------------------------------------------  ------------------------------------------------------------------- Left ventricle:  The cavity size was normal. There was severe focal basal and mild concentric hypertrophy. Systolic function was normal. The estimated ejection fraction was in the range of 55% to 60%. Wall motion was normal; there were no regional wall motion abnormalities. There was an increased relative contribution of atrial contraction to ventricular filling. Doppler parameters are consistent with abnormal left ventricular relaxation (grade 1 diastolic dysfunction).  ------------------------------------------------------------------- Aortic valve:   Trileaflet; normal thickness, mildly calcified leaflets. Mobility was not restricted.  Doppler:  Transvalvular velocity was within the normal range. There was no stenosis. There was mild regurgitation.  ------------------------------------------------------------------- Aorta:  Aortic root: The aortic root was normal in size.  ------------------------------------------------------------------- Mitral valve:   Structurally normal valve.   Mobility was not restricted.  Doppler:  Transvalvular velocity was within the normal range. There was no evidence for stenosis. There was trivial regurgitation.    Valve area by pressure half-time:  2.06 cm^2. Indexed valve area by pressure half-time: 0.95 cm^2/m^2.  ------------------------------------------------------------------- Left atrium:  The atrium was normal in size.  ------------------------------------------------------------------- Right ventricle:  The cavity size was normal. Wall thickness was normal. Systolic function was normal.  ------------------------------------------------------------------- Pulmonic valve:    Structurally normal valve.   Cusp separation was normal.  Doppler:  Transvalvular velocity was within the normal range. There was no evidence for stenosis. There was no regurgitation.  ------------------------------------------------------------------- Tricuspid valve:   Structurally normal valve.    Doppler: Transvalvular velocity was within the normal range. There was trivial regurgitation.  ------------------------------------------------------------------- Pulmonary artery:   The main pulmonary artery was normal-sized. Systolic pressure was within the normal range.  ------------------------------------------------------------------- Right atrium:  The atrium was normal in size.  ------------------------------------------------------------------- Pericardium:  There was no pericardial effusion.  ------------------------------------------------------------------- Systemic veins: Inferior vena cava: The vessel was normal in size.  ------------------------------------------------------------------- Measurements  Left ventricle                           Value          Reference LV ID, ED, PLAX chordal          (L)     33    mm       43 - 52 LV ID, ES, PLAX chordal          (L)     22    mm       23 - 38 LV fx shortening, PLAX chordal           33    %        >=29 LV PW thickness, ED                      12  mm       ---------- IVS/LV PW ratio, ED                      1.25           <=1.3 LV e&', lateral                           5.87   cm/s     ---------- LV E/e&', lateral                         9.27           ---------- LV e&', medial                            4.13  cm/s     ---------- LV E/e&', medial                          13.17          ---------- LV e&', average                           5     cm/s     ---------- LV E/e&', average                         10.88          ----------  Ventricular septum                       Value          Reference IVS thickness, ED                        15    mm       ----------  LVOT                                     Value          Reference LVOT ID, S                               20    mm       ---------- LVOT area                                3.14  cm^2     ----------  Aortic valve                             Value          Reference Aortic regurg peak velocity              365   cm/s     ---------- Aortic regurg pressure half-time         837   ms       ---------- Aortic regurg peak gradient              53    mm Hg    ----------  Aorta                                    Value          Reference Aortic root ID, ED                       33    mm       ----------  Left atrium                              Value          Reference LA ID, A-P, ES                           30    mm       ---------- LA ID/bsa, A-P                           1.39  cm/m^2   <=2.2 LA volume, S                             59.8  ml       ---------- LA volume/bsa, S                         27.6  ml/m^2   ---------- LA volume, ES, 1-p A4C                   53    ml       ---------- LA volume/bsa, ES, 1-p A4C               24.5  ml/m^2   ---------- LA volume, ES, 1-p A2C                   65.9  ml       ---------- LA volume/bsa, ES, 1-p A2C               30.4  ml/m^2   ----------  Mitral valve                             Value          Reference Mitral E-wave peak velocity              54.4  cm/s     ---------- Mitral A-wave peak velocity              92.5  cm/s     ---------- Mitral  deceleration time         (H)     366   ms       150 - 230 Mitral E/A ratio, peak                   0.6            ---------- Mitral valve area, PHT, DP               2.06  cm^2     ---------- Mitral valve area/bsa, PHT, DP           0.95  cm^2/m^2 ----------  Pulmonary arteries                       Value          Reference PA pressure, S, DP                       20    mm Hg    <=30  Tricuspid valve                          Value          Reference Tricuspid regurg peak velocity           209   cm/s     ---------- Tricuspid peak RV-RA gradient            17    mm Hg    ----------  Right atrium                             Value          Reference RA ID, S-I, ES, A4C              (H)     53    mm       34 - 49 RA area, ES, A4C                         11.4  cm^2     8.3 - 19.5 RA volume, ES, A/L                       20.7  ml       ---------- RA volume/bsa, ES, A/L                   9.6   ml/m^2   ----------  Systemic veins                           Value          Reference Estimated CVP                            3     mm Hg    ----------  Right ventricle                          Value          Reference RV pressure, S, DP                       20    mm Hg    <=30 RV s&', lateral, S                        11.6  cm/s     ----------  Pulmonic valve                           Value          Reference Pulmonic regurg velocity, ED             102   cm/s     ----------  Legend: (L)  and  (  H)  mark values outside specified reference range.  ------------------------------------------------------------------- Prepared and Electronically Authenticated by  Armanda Magic, MD 2019-10-31T11:14:52    MONITORS  CARDIAC EVENT MONITOR 02/17/2021  Narrative  Patient had a minimum heart rate of 52 bpm, maximum heart rate of 141 bpm, and average heart rate of 76 bpm.  Predominant underlying rhythm was sinus rhythm.  One run of non-sustained ventricular tachycardia occurred lasting 6 beats at  longest with a max rate of 131 bpm at fastest.  Isolated PACs were rare (<1.0%).  Isolated PVCs were occasional (1.0%).  No evidence of complete heart block or atrial fibrillation.  Triggered and diary events associated with sinus rhythm.  No malignant arrhythmias.            Risk Assessment/Calculations:            Physical Exam:   VS:  BP 120/70   Pulse 65   Ht 5\' 7"  (1.702 m)   Wt 184 lb (83.5 kg)   BMI 28.82 kg/m    Wt Readings from Last 3 Encounters:  05/10/23 184 lb (83.5 kg)  05/05/23 150 lb (68 kg)  02/07/23 180 lb 6.4 oz (81.8 kg)    GEN: Well nourished, well developed in no acute distress NECK: No JVD; No carotid bruits CARDIAC: RRR, no murmurs, rubs, gallops RESPIRATORY:  Clear to auscultation without rales, wheezing or rhonchi  ABDOMEN: Soft, non-tender, non-distended EXTREMITIES:  No edema; No deformity   ASSESSMENT AND PLAN: .    HTN -initial BP in clinic 136/62 with repeat 120/70 without intervention.  Home BP cuff previously found to be accurate.   Continue present antihypertensive regimen hydrochlorothiazide 25 mg daily, losartan 25 mg daily, Toprol 50 mg daily.  Discussed to monitor BP at home at least 2 hours after medications and sitting for 5-10 minutes.    Obesity - Weight loss via diet and exercise encouraged. Discussed the impact being overweight would have on cardiovascular risk.    LE edema -well-controlled on hydrochlorothiazide 25 mg daily.  Aortic atherosclerosis / Family history of cardiovascular disease- Nonspecific activity intolerance which may be more related to recent stressors. No chest pain. Given risk factors of aortic atherosclerosis, HTN - plan for coronary calcium score. She understandably wants to be sure of her health as her sister with some memory issues is coming to live with her.  Last lipid panel 12/2020, will inquire if PCP can collect at upcoming visit as not fasting today.    Palpitations / PVC / PAC - Continue present  dose Toprol.    Dispo: follow up in 3-4 months  Signed, Alver Sorrow, NP

## 2023-05-10 NOTE — Patient Instructions (Signed)
Medication Instructions:  Continue your current medications.   *If you need a refill on your cardiac medications before your next appointment, please call your pharmacy*   Lab Work: We will reach out to Dr. Jon Billings to get your fasting cholesterol labs at your next appointment.    Testing/Procedures: Your provider has recommended a coronary calcium score.    Follow-Up: At [facility], you and your health needs are our priority.  As part of our continuing mission to provide you with exceptional heart care, we have created designated Provider Care Teams.  These Care Teams include your primary Cardiologist (physician) and Advanced Practice Providers (APPs -  Physician Assistants and Nurse Practitioners) who all work together to provide you with the care you need, when you need it.  We recommend signing up for the patient portal called "MyChart".  Sign up information is provided on this After Visit Summary.  MyChart is used to connect with patients for Virtual Visits (Telemedicine).  Patients are able to view lab/test results, encounter notes, upcoming appointments, etc.  Non-urgent messages can be sent to your provider as well.   To learn more about what you can do with MyChart, go to [email or web address].    Your next appointment:   3-4 month(s)  Provider:   Chilton Si, MD or Gillian Shields, NP    Other Instructions  Tips to Measure your Blood Pressure Correctly  Let us know if BP routinely more than 130/80 at home.   Here's what you can do to ensure a correct reading:  Don't drink a caffeinated beverage or smoke during the 30 minutes before the test.  Sit quietly for five minutes before the test begins.  During the measurement, sit in a chair with your feet on the floor and your arm supported so your elbow is at about heart level.  The inflatable part of the cuff should completely cover at least 80% of your upper arm, and the cuff should be placed on bare skin,  not over a shirt.  Don't talk during the measurement.  Blood pressure categories  Blood pressure category SYSTOLIC (upper number)  DIASTOLIC (lower number)  Normal Less than 120 mm Hg and Less than 80 mm Hg  Elevated 120-129 mm Hg and Less than 80 mm Hg  High blood pressure: Stage 1 hypertension 130-139 mm Hg or 80-89 mm Hg  High blood pressure: Stage 2 hypertension 140 mm Hg or higher or 90 mm Hg or higher  Hypertensive crisis (consult your doctor immediately) Higher than 180 mm Hg and/or Higher than 120 mm Hg  Source: American Heart Association and American Stroke Association. For more on getting your blood pressure under control, buy Controlling Your Blood Pressure, a Special Health Report from [facility].   Blood Pressure Log   Date   Time  Blood Pressure  Example: Nov 1 9 AM 124/78

## 2023-05-11 ENCOUNTER — Ambulatory Visit: Payer: Medicare HMO | Admitting: Internal Medicine

## 2023-06-02 ENCOUNTER — Ambulatory Visit (HOSPITAL_BASED_OUTPATIENT_CLINIC_OR_DEPARTMENT_OTHER)
Admission: RE | Admit: 2023-06-02 | Discharge: 2023-06-02 | Disposition: A | Discharge status: Home / Self Care | Payer: Medicare Other | Source: Ambulatory Visit | Attending: Family | Admitting: Family

## 2023-06-02 DIAGNOSIS — I700 Atherosclerosis of aorta: Secondary | ICD-10-CM | POA: Insufficient documentation

## 2023-06-02 DIAGNOSIS — Z8249 Family history of ischemic heart disease and other diseases of the circulatory system: Secondary | ICD-10-CM | POA: Insufficient documentation

## 2023-06-07 ENCOUNTER — Ambulatory Visit (INDEPENDENT_AMBULATORY_CARE_PROVIDER_SITE_OTHER): Payer: Medicare Other | Admitting: Internal Medicine

## 2023-06-07 ENCOUNTER — Encounter: Payer: Self-pay | Admitting: Internal Medicine

## 2023-06-07 VITALS — BP 134/76 | HR 59 | Temp 98.2°F | Resp 16 | Ht 67.0 in | Wt 183.4 lb

## 2023-06-07 DIAGNOSIS — R2681 Unsteadiness on feet: Secondary | ICD-10-CM

## 2023-06-07 DIAGNOSIS — I5189 Other ill-defined heart diseases: Secondary | ICD-10-CM | POA: Insufficient documentation

## 2023-06-07 DIAGNOSIS — M25562 Pain in left knee: Secondary | ICD-10-CM | POA: Diagnosis not present

## 2023-06-07 DIAGNOSIS — I4729 Other ventricular tachycardia: Secondary | ICD-10-CM | POA: Diagnosis not present

## 2023-06-07 DIAGNOSIS — I700 Atherosclerosis of aorta: Secondary | ICD-10-CM

## 2023-06-07 DIAGNOSIS — G8929 Other chronic pain: Secondary | ICD-10-CM

## 2023-06-07 DIAGNOSIS — F418 Other specified anxiety disorders: Secondary | ICD-10-CM | POA: Diagnosis not present

## 2023-06-07 DIAGNOSIS — F4322 Adjustment disorder with anxiety: Secondary | ICD-10-CM | POA: Diagnosis not present

## 2023-06-07 DIAGNOSIS — F5102 Adjustment insomnia: Secondary | ICD-10-CM | POA: Diagnosis not present

## 2023-06-07 DIAGNOSIS — M19049 Primary osteoarthritis, unspecified hand: Secondary | ICD-10-CM | POA: Diagnosis not present

## 2023-06-07 DIAGNOSIS — I10 Essential (primary) hypertension: Secondary | ICD-10-CM

## 2023-06-07 DIAGNOSIS — F4320 Adjustment disorder, unspecified: Secondary | ICD-10-CM | POA: Insufficient documentation

## 2023-06-07 MED ORDER — BUSPIRONE HCL 10 MG PO TABS
10.0000 mg | ORAL_TABLET | Freq: Two times a day (BID) | ORAL | 3 refills | Status: DC
Start: 2023-06-07 — End: 2024-01-11

## 2023-06-07 NOTE — Assessment & Plan Note (Signed)
We will refer to physical therapy.

## 2023-06-07 NOTE — Assessment & Plan Note (Signed)
Her anxiety is not well-controlled with the current Buspirone regimen, likely due to stress related to her sister's dementia. We will increase the Buspirone dosage and monitor for improvement in anxiety symptoms.

## 2023-06-07 NOTE — Patient Instructions (Signed)
VISIT SUMMARY:  During your visit, we discussed your concerns about anxiety, arthritis, and changes in your heart rate. We also reviewed your current medications and lifestyle habits. Your anxiety seems to be related to the stress of caring for your sister with dementia, and your arthritis is causing significant discomfort. Despite a decrease in your heart rate, your heart health appears stable. You've been managing your arthritis pain with CBD cream and have been reluctant to take additional medications for cholesterol.  YOUR PLAN:  -ANXIETY: Your anxiety is not well-controlled with the current medication, likely due to the stress of caring for your sister. We will increase the dosage of your anxiety medication (Buspirone) and monitor for improvement.  -ARTHRITIS: You're experiencing pain in your hand and knee. We will refer you to an Orthopedic specialist for further evaluation and management. We will also start Physical Therapy for your hand and knee pain.  -HEART HEALTH: Your heart health is stable, and your current medication is effectively controlling any potential fluid accumulation. We will continue to monitor your heart health.  -CHOLESTEROL: Despite your reluctance to take cholesterol medication, we recommend dietary modifications, including increased intake of extra virgin olive oil and avocados, to manage your cholesterol levels.  -INSOMNIA: Your insomnia seems to be related to your anxiety. We will consider increasing the dosage of your anxiety medication to assist with sleep and monitor for improvement in sleep quality.  -BLOOD PRESSURE: Your blood pressure was slightly elevated during the visit, likely due to anxiety. We will continue the current management and monitor your blood pressure at home.  INSTRUCTIONS:  Please continue to monitor your heart rate and blood pressure at home. Follow the new medication plan for your anxiety and insomnia. Start the dietary modifications for  cholesterol management. We will arrange for your referral to an Orthopedic specialist and Physical Therapy. Please schedule a follow-up appointment to monitor your progress.

## 2023-06-07 NOTE — Assessment & Plan Note (Signed)
Her blood pressure was slightly elevated at the office visit, likely due to white coat syndrome, while home readings are within the normal range. We will continue the current management and monitor blood pressure at home.

## 2023-06-07 NOTE — Assessment & Plan Note (Addendum)
She following with [facility] She experiences pain in her hand and knee but has found relief using CBD cream. We will refer her to an Orthopedic specialist for further evaluation and management and initiate Physical Therapy for the hand and knee pain.

## 2023-06-07 NOTE — Assessment & Plan Note (Addendum)
We discussed statin again but she defers/declines recommendations today, and has full decision making capacity.  We discussed aspirin again and after review of new literature on this, its no longer recommended in primary prevention. Despite a low coronary calcium score, she has aortic atherosclerosis and a 10-year risk of 22% for a heart attack or stroke. She declines cholesterol medication. We will encourage dietary modifications, including increased intake of extra virgin olive oil and avocados.

## 2023-06-07 NOTE — Assessment & Plan Note (Signed)
She shows no symptoms of fluid overload, and Hydrochlorothiazide is effectively controlling any potential fluid accumulation. We will continue Hydrochlorothiazide as currently prescribed.

## 2023-06-07 NOTE — Assessment & Plan Note (Addendum)
Cannabidiol (CBD) helps, encouraged patient to continue using it Related with anxiety and adjustment , increase buspirone Offered behavioral health counselor for CBT for this and anxiety. Her insomnia is likely related to anxiety, and Melatonin was not effective, causing increased anxiety. We will consider increasing the Buspirone dosage to assist with sleep and monitor for improvement in sleep quality.

## 2023-06-07 NOTE — Progress Notes (Signed)
[facility]: [phone]   -- Medical Office Visit --  Patient:  [name]      Age: 72 y.o.       Sex:  female  Date:   06/07/2023 Patient Care Team: Lula Olszewski, MD as PCP - General (Internal Medicine) Chilton Si, MD as PCP - Cardiology (Cardiology) Zenovia Jordan, MD as Consulting Physician (Rheumatology) Meredith Pel, NP as Nurse Practitioner (Gastroenterology) Betha Loa, MD as Consulting Physician (Orthopedic Surgery) Rachel Moulds, MD as Consulting Physician (Hematology and Oncology) Meryl Dare, MD as Consulting Physician (Gastroenterology) Today's Healthcare Provider: Lula Olszewski, MD   Assessment & Plan Adjustment insomnia Cannabidiol (CBD) helps, encouraged patient to continue using it Related with anxiety and adjustment , increase buspirone Offered behavioral health counselor for CBT for this and anxiety. Her insomnia is likely related to anxiety, and Melatonin was not effective, causing increased anxiety. We will consider increasing the Buspirone dosage to assist with sleep and monitor for improvement in sleep quality. Adjustment disorder with anxious mood Her anxiety is not well-controlled with the current Buspirone regimen, likely due to stress related to her sister's dementia. We will increase the Buspirone dosage and monitor for improvement in anxiety symptoms. Difficulty standing We will refer to physical therapy  Diastolic dysfunction She shows no symptoms of fluid overload, and Hydrochlorothiazide is effectively controlling any potential fluid accumulation. We will continue Hydrochlorothiazide as currently prescribed. NSVT (nonsustained ventricular tachycardia) (HCC) The NSVT is likely due to age-related stiffening of the heart, and she exhibits no current symptoms. Management will remain unchanged, with continued monitoring. Other specified anxiety disorders Her anxiety is not well-controlled with the  current Buspirone regimen, likely due to stress related to her sister's dementia. We will increase the Buspirone dosage and monitor for improvement in anxiety symptoms. Hand arthritis She following with [facility] She experiences pain in her hand and knee but has found relief using CBD cream. We will refer her to an Orthopedic specialist for further evaluation and management and initiate Physical Therapy for the hand and knee pain. Chronic pain of left knee She will discuss with her orthopedist Aortic atherosclerosis (HCC) We discussed statin again but she defers/declines recommendations today, and has full decision making capacity.  We discussed aspirin again and after review of new literature on this, its no longer recommended in primary prevention. Despite a low coronary calcium score, she has aortic atherosclerosis and a 10-year risk of 22% for a heart attack or stroke. She declines cholesterol medication. We will encourage dietary modifications, including increased intake of extra virgin olive oil and avocados. Hypertension, unspecified type Her blood pressure was slightly elevated at the office visit, likely due to white coat syndrome, while home readings are within the normal range. We will continue the current management and monitor blood pressure at home.   Medical Decision Making: 2 or more stable chronic illnesses Prescription drug management     ED Discharge Orders          Ordered    busPIRone (BUSPAR) 10 MG tablet  2 times daily        06/07/23 1127    Ambulatory referral to Physical Therapy        06/07/23 1127          Diagnoses and all orders for this visit: Diastolic dysfunction NSVT (nonsustained ventricular tachycardia) (HCC) Other specified anxiety disorders -     busPIRone (BUSPAR) 10 MG tablet; Take 1 tablet (10 mg total)  by mouth 2 (two) times daily. 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 Future Appointments  Date Time Provider Department Center  06/30/2023  2:45 PM Rachel Moulds, MD [facility] None  07/13/2023 10:30 AM [facility] MM 2 [facility]  08/10/2023 10:20 AM Lula Olszewski, MD [facility]  08/12/2023 10:55 AM Alver Sorrow, NP [facility]  01/24/2024  8:15 AM [facility] ANNUAL WELLNESS VISIT 1 [facility]          Subjective   72 y.o. 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, without heart failure; Thiamine deficiency neuropathy; Weight disorder; Insomnia; Lower extremity edema; Primary localized osteoarthrosis of multiple sites; Rheumatoid arthritis (HCC); GERD (gastroesophageal reflux disease); Prediabetes; Difficulty standing; Hand arthritis; Aortic atherosclerosis (HCC); Diverticular disease; Upper abdominal pain; Chronic idiopathic constipation; DDD (degenerative disc disease), thoracic; Elevated amylase; Abnormal finding on imaging of liver; Intractable abdominal pain; Gastric wall thickening; Chronic kidney disease, stage 2 (mild); Diastolic dysfunction; NSVT (nonsustained ventricular tachycardia) (HCC); Adjustment disorder; Chronic pain of left knee; and Other specified anxiety disorders on their problem list. Her reasons/main concerns/chief complaints for today's office visit are Follow-up (1 month follow-up/Anxiety/)    ------------------------------------------------------------------------------------------------------------------------ AI-Extracted: Discussed the use of AI scribe software for clinical note transcription with the patient, who gave verbal consent to proceed.  History of Present Illness   The patient, a 72 year old individual with a history of diastolic dysfunction and non-sustained ventricular tachycardia, presents with concerns of anxiety and arthritis. The anxiety, which the patient attributes to the stress of caring for a sister with dementia, has not been alleviated by the current 10mg  dose of Buspirone. The patient also reports persistent arthritis pain in the knee and hand, which has been previously managed with knee replacements and is currently causing significant discomfort.  Since the last visit, the patient has seen a cardiologist and received a cardiac score of zero, indicating no significant cardiac issues. However, the patient has noticed a decrease in heart rate, from an average of 70 to around 59-60 beats per minute. Despite this, the patient denies any leg swelling, attributing any knee swelling to the existing arthritis rather than cardiac issues.  The patient has been taking hydrochlorothiazide to manage fluid retention and has not reported any significant side effects. However, the patient has expressed reluctance to take additional medications, such as cholesterol medication, despite a 22% 10-year risk for heart attack or stroke. The patient has also declined the use of aspirin for primary prevention, citing recent literature questioning its efficacy.  In terms of lifestyle, the patient has reported using CBD cream for arthritis pain relief, which has been effective. The patient has also tried melatonin for insomnia but found it increased anxiety levels.  The patient's fatigue, a previous concern, has improved since the last visit. However, the patient's anxiety remains a  significant issue, potentially exacerbated by the stress of caring for a family member with dementia.      She has a past medical  history of Acute pain of right shoulder (11/01/2022), Aortic atherosclerosis (HCC) (11/01/2022), Arthritis, Back pain, Blood transfusion, Breast cancer (HCC) (2009), Cataract, Chicken pox, Colon cancer (HCC) (2003), Depression, Difficulty standing (09/08/2022), Diverticular disease (11/01/2022), Diverticulitis, Edema of both ankles, Family history of breast cancer (10/24/2017), Flank pain (11/03/2007), Gallbladder problem, Gastritis, Generalized abdominal pain (12/15/2022), GERD (gastroesophageal reflux disease), Hepatitis, Hiatal hernia, History of stomach ulcers, Hypertension, Joint pain, Lactose intolerance, Lower extremity edema (01/11/2022), Lupus (HCC), Multiple gastric ulcers, Palpitations, Personal history of radiation therapy (2009), Prediabetes (09/08/2022), Rheumatic fever, Rheumatoid arthritis (HCC), SLE (systemic lupus erythematosus related syndrome) (HCC), Urinary incontinence, and UTI (urinary tract infection).  Problem list overviews that were updated at today's visit: Problem  Diastolic Dysfunction  Nsvt (Nonsustained Ventricular Tachycardia) (Hcc)   Negative CT coronary calcium of 0 05/2023 remote history 6 beats during palpation workup    Adjustment Disorder   Mainly anxiety related with caring for demented sister.    Chronic Pain of Left Knee  Other Specified Anxiety Disorders  Chronic Kidney Disease, Stage 2 (Mild)   Lab Results this test GFR 77 on 11/16/22 Advised patient by MyChart message : Compared to prior readings... this MIGHT  be a very slight reduction in kidney function.  It still corresponds to stage 2 chronic kidney disease Recommend stay hydrated, follow up within 3 months and discuss this in person Lab Results  Component Value Date/Time   GFR 86.01 09/08/2022 12:37 PM   GFR 86.30 09/17/2021 03:56 PM   GFR 79.84 12/30/2020 03:23 PM       Abnormal Finding On Imaging of Liver   She is planned to see gastroenterologist for this.  12/13/22 CT abdomen: A few tiny sub-cm low attention lesions are too small to characterize, but most likely represent tiny cysts. 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 No suspicious hepatic masses are identified. Prior cholecystectomy. No evidence of biliary obstruction.     Elevated Amylase   Amylase    Component Value Date/Time   AMYLASE 125 (H) 11/15/2022 1202     Hand Arthritis   Due to lupus Following with rheum On plaquenil that helps a lot Can flare bilateral   Aortic Atherosclerosis (Hcc)   On 2022 CT abdomen. CT coronary calcium is low - 0 in 2024.  Statin myopathy history: never took, cholesterol medication(s) never smoked. Prefers to avoid medication(s)  Current Regimen: Diet and exercise   Lab Results  Component Value Date   CHOL 153 06/29/2021   CHOL 159 12/30/2020   CHOL 143 11/24/2007   Lab Results  Component Value Date   HDL 57 06/29/2021   HDL 61.30 12/30/2020   HDL 55 11/24/2007   Lab Results  Component Value Date   LDLCALC 80 06/29/2021   LDLCALC 76 12/30/2020   LDLCALC 71 11/24/2007   Lab Results  Component Value Date   TRIG 83 06/29/2021   TRIG 109.0 12/30/2020   TRIG 86 11/24/2007   Lab Results  Component Value Date   CHOLHDL 3 12/30/2020   CHOLHDL 2.6 Ratio 11/24/2007   No results found for: "LDLDIRECT"  The 10-year ASCVD risk score (Arnett DK, et al., 2019) is: 22.9%   Values used to calculate the score:     Age: 33 years     Sex: Female     Is Non-Hispanic African American: Yes     Diabetic: Yes     Tobacco smoker: No     Systolic  Blood Pressure: 122 mmHg     Is BP treated: Yes     HDL Cholesterol: 57 mg/dL     Total Cholesterol: 153 mg/dL    Diverticular Disease   On 2022 CT abd   Chronic Idiopathic Constipation  Ddd (Degenerative Disc Disease), Thoracic     Based on reinterpretation of this xray while evalg upper abdomen pain   Primary Localized Osteoarthrosis of Multiple Sites  Rheumatoid Arthritis (Hcc)  Gerd (Gastroesophageal Reflux Disease)   Associated with hiatal hernia Takes occasional as needed omeprazole burst. Encouraged try pepcid complete she not taking except occasional as needed, same with Maalox Stomach pain resolved.   Prediabetes   Lab Results  Component Value Date   HGBA1C 5.7 (H) 06/29/2021      Weight Disorder  Insomnia   Flared with seeing sister summer 2024 Difficulty falling asleep and waking up She snores but not aware of apnea-never had sleep study She hasn't tried medication(s) Getting about 4 hours nightly No caffeine relationship Doesn't seem related with arthritis Is associated with racing thoughts. Music and bible helps.     Hiatal Hernia  Lupus (Hcc)        Current Outpatient Medications on File Prior to Visit  Medication Sig   hydrochlorothiazide (HYDRODIURIL) 25 MG tablet TAKE 1 TABLET (25 MG TOTAL) BY MOUTH DAILY.   hydroxychloroquine (PLAQUENIL) 200 MG tablet Take 200 mg by mouth 2 (two) times daily.   losartan (COZAAR) 25 MG tablet Take 1 tablet (25 mg total) by mouth daily.   Melatonin 1 MG CAPS Take 1 capsule (1 mg total) by mouth at bedtime.   metoprolol succinate (TOPROL-XL) 50 MG 24 hr tablet Take 1 tablet (50 mg total) by mouth daily.   Multiple Vitamins-Minerals (EMERGEN-C IMMUNE PLUS PO) Take 1 tablet by mouth daily.   potassium chloride (KLOR-CON) 10 MEQ tablet TAKE 1 TABLET BY MOUTH EVERY DAY   No current facility-administered medications on file prior to visit.   Medications Discontinued During This Encounter  Medication Reason   busPIRone (BUSPAR) 5 MG tablet Reorder     Objective   Physical Exam  BP 134/76   Pulse (!) 59   Temp 98.2 F (36.8 C) (Temporal)   Resp 16   Ht 5\' 7"  (1.702 m)   Wt 183 lb 6 oz (83.2 kg)   SpO2 98%   BMI 28.72 kg/m  Wt Readings  from Last 10 Encounters:  06/07/23 183 lb 6 oz (83.2 kg)  05/10/23 184 lb (83.5 kg)  05/05/23 150 lb (68 kg)  02/07/23 180 lb 6.4 oz (81.8 kg)  01/20/23 185 lb (83.9 kg)  01/12/23 185 lb (83.9 kg)  01/04/23 185 lb (83.9 kg)  12/29/22 186 lb 1.6 oz (84.4 kg)  12/15/22 183 lb 6.4 oz (83.2 kg)  11/30/22 183 lb 3.2 oz (83.1 kg)   Vital signs reviewed.  Nursing notes reviewed. Weight trend reviewed. Abnormalities and Problem-Specific physical exam findings:  hand arthritis visible, difficulty rising sit to stand  General Appearance:  No acute distress appreciable.   Well-groomed, healthy-appearing female.  Well proportioned with no abnormal fat distribution.  Good muscle tone. Pulmonary:  Normal work of breathing at rest, no respiratory distress apparent. SpO2: 98 %  Musculoskeletal: All extremities are intact.  Neurological:  Awake, alert, oriented, and engaged.  No obvious focal neurological deficits or cognitive impairments.  Sensorium seems unclouded.   Speech is clear and coherent with logical content. Psychiatric:  Appropriate mood, pleasant and cooperative demeanor, thoughtful  and engaged during the exam  Results   LABS Glucose: Low (04/2023) Neutrophils: Low (04/2023) Lymphocytes: Elevated (04/2023)  RADIOLOGY CT coronary calcium score: 0  DIAGNOSTIC Echocardiogram: Diastolic dysfunction (2019)        No results found for any visits on 06/07/23.  Office Visit on 05/05/2023  Component Date Value   TSH 05/05/2023 0.89    Sodium 05/05/2023 136    Potassium 05/05/2023 4.0    Chloride 05/05/2023 102    CO2 05/05/2023 26    Glucose, Bld 05/05/2023 79    BUN 05/05/2023 16    Creatinine, Ser 05/05/2023 0.73    Total Bilirubin 05/05/2023 0.4    Alkaline Phosphatase 05/05/2023 88    AST 05/05/2023 19    ALT 05/05/2023 12    Total Protein 05/05/2023 6.9    Albumin 05/05/2023 4.0    GFR 05/05/2023 81.13    Calcium 05/05/2023 9.9    WBC 05/05/2023 4.3    RBC 05/05/2023  3.97    Hemoglobin 05/05/2023 12.4    HCT 05/05/2023 37.7    MCV 05/05/2023 94.8    MCHC 05/05/2023 32.9    RDW 05/05/2023 13.7    Platelets 05/05/2023 185.0    Neutrophils Relative % 05/05/2023 33.8 (L)    Lymphocytes Relative 05/05/2023 47.1 (H)    Monocytes Relative 05/05/2023 16.1 (H)    Eosinophils Relative 05/05/2023 2.5    Basophils Relative 05/05/2023 0.5    Neutro Abs 05/05/2023 1.5    Lymphs Abs 05/05/2023 2.0    Monocytes Absolute 05/05/2023 0.7    Eosinophils Absolute 05/05/2023 0.1    Basophils Absolute 05/05/2023 0.0    Hgb A1c MFr Bld 05/05/2023 5.4    Vitamin B-12 05/05/2023 326    Folate 05/05/2023 16.7    VITD 05/05/2023 33.61   Office Visit on 02/07/2023  Component Date Value   Sodium 02/07/2023 140    Potassium 02/07/2023 3.9    Chloride 02/07/2023 103    CO2 02/07/2023 28    Glucose, Bld 02/07/2023 69 (L)    BUN 02/07/2023 10    Creatinine, Ser 02/07/2023 0.82    Total Bilirubin 02/07/2023 0.4    Alkaline Phosphatase 02/07/2023 81    AST 02/07/2023 22    ALT 02/07/2023 11    Total Protein 02/07/2023 6.7    Albumin 02/07/2023 3.9    GFR 02/07/2023 70.68    Calcium 02/07/2023 9.9    Amylase 02/07/2023 57    Lipase 02/07/2023 14   Office Visit on 11/30/2022  Component Date Value   H pylori Ag, Stl 12/13/2022 Negative   Scanned Document on 11/16/2022  Component Date Value   EGFR 11/16/2022 77.0   Orders Only on 11/15/2022  Component Date Value   Amylase 11/15/2022 125 (H)    Lipase 11/15/2022 30   Appointment on 09/08/2022  Component Date Value   WBC 09/08/2022 4.4    RBC 09/08/2022 4.16    Platelets 09/08/2022 214.0    Hemoglobin 09/08/2022 12.9    HCT 09/08/2022 39.1    MCV 09/08/2022 93.9    MCHC 09/08/2022 32.9    RDW 09/08/2022 13.9    Sodium 09/08/2022 139    Potassium 09/08/2022 4.0    Chloride 09/08/2022 104    CO2 09/08/2022 28    Glucose, Bld 09/08/2022 86    BUN 09/08/2022 12    Creatinine, Ser 09/08/2022 0.69    Total  Bilirubin 09/08/2022 0.5    Alkaline Phosphatase 09/08/2022 63    AST 09/08/2022 26  ALT 09/08/2022 20    Total Protein 09/08/2022 7.2    Albumin 09/08/2022 4.0    GFR 09/08/2022 86.01    Calcium 09/08/2022 10.0    Color, Urine 09/08/2022 YELLOW    APPearance 09/08/2022 CLEAR    Specific Gravity, Urine 09/08/2022 1.015    pH 09/08/2022 6.5    Total Protein, Urine 09/08/2022 NEGATIVE    Urine Glucose 09/08/2022 NEGATIVE    Ketones, ur 09/08/2022 NEGATIVE    Bilirubin Urine 09/08/2022 NEGATIVE    Hgb urine dipstick 09/08/2022 NEGATIVE    Urobilinogen, UA 09/08/2022 1.0    Leukocytes,Ua 09/08/2022 NEGATIVE    Nitrite 09/08/2022 NEGATIVE    WBC, UA 09/08/2022 0-2/hpf    RBC / HPF 09/08/2022 0-2/hpf    Squamous Epithelial / HPF 09/08/2022 Rare(0-4/hpf)    Renal Epithel, UA 09/08/2022 None Seen    Bacteria, UA 09/08/2022 Rare(<10/hpf) (A)    Hgb A1c MFr Bld 09/08/2022 5.6   Telephone on 07/01/2022  Component Date Value   Glucose 07/14/2022 100 (H)    BUN 07/14/2022 19    Creatinine, Ser 07/14/2022 0.66    eGFR 07/14/2022 93    BUN/Creatinine Ratio 07/14/2022 29 (H)    Sodium 07/14/2022 141    Potassium 07/14/2022 4.0    Chloride 07/14/2022 103    CO2 07/14/2022 22    Calcium 07/14/2022 9.9   Appointment on 06/29/2022  Component Date Value   Specimen Description 06/29/2022                     Value:URINE, CLEAN CATCH Performed at [facility], [address] [place], [place] [address]    Special Requests 06/29/2022                     Value:NONE Performed at [facility], [address], [place], [place] [address]    Culture 06/29/2022 MULTIPLE SPECIES PRESENT, SUGGEST RECOLLECTION (A)    Report Status 06/29/2022 06/30/2022 FINAL    Color, Urine 06/29/2022 YELLOW    APPearance 06/29/2022 CLEAR    Specific Gravity, Urine 06/29/2022 1.021    pH 06/29/2022 6.0    Glucose, UA 06/29/2022 NEGATIVE    Hgb urine dipstick  06/29/2022 NEGATIVE    Bilirubin Urine 06/29/2022 NEGATIVE    Ketones, ur 06/29/2022 NEGATIVE    Protein, ur 06/29/2022 NEGATIVE    Nitrite 06/29/2022 NEGATIVE    Leukocytes,Ua 06/29/2022 NEGATIVE    RBC / HPF 06/29/2022 0-5    WBC, UA 06/29/2022 0-5    Bacteria, UA 06/29/2022 NONE SEEN    Squamous Epithelial / HPF 06/29/2022 0-5    Sodium 06/29/2022 137    Potassium 06/29/2022 3.3 (L)    Chloride 06/29/2022 105    CO2 06/29/2022 29    Glucose, Bld 06/29/2022 71    BUN 06/29/2022 20    Creatinine 06/29/2022 0.78    Calcium 06/29/2022 9.7    Total Protein 06/29/2022 7.6    Albumin 06/29/2022 3.9    AST 06/29/2022 22    ALT 06/29/2022 16    Alkaline Phosphatase 06/29/2022 70    Total Bilirubin 06/29/2022 0.5    GFR, Estimated 06/29/2022 >60    Anion gap 06/29/2022 3 (L)    WBC Count 06/29/2022 4.9    RBC 06/29/2022 4.04    Hemoglobin 06/29/2022 12.7    HCT 06/29/2022 38.2    MCV 06/29/2022 94.6    MCH 06/29/2022 31.4    MCHC 06/29/2022 33.2  RDW 06/29/2022 12.7    Platelet Count 06/29/2022 199    nRBC 06/29/2022 0.0    Neutrophils Relative % 06/29/2022 32    Neutro Abs 06/29/2022 1.6 (L)    Lymphocytes Relative 06/29/2022 47    Lymphs Abs 06/29/2022 2.3    Monocytes Relative 06/29/2022 13    Monocytes Absolute 06/29/2022 0.6    Eosinophils Relative 06/29/2022 7    Eosinophils Absolute 06/29/2022 0.3    Basophils Relative 06/29/2022 1    Basophils Absolute 06/29/2022 0.0    Immature Granulocytes 06/29/2022 0    Abs Immature Granulocytes 06/29/2022 0.01    No image results found.   CT CARDIAC SCORING (SELF PAY ONLY)  Result Date: 06/02/2023 CLINICAL DATA:  Cardiovascular Disease Risk stratification EXAM: Coronary Calcium Score TECHNIQUE: A gated, non-contrast computed tomography scan of the heart was performed using 3mm slice thickness. Axial images were analyzed on a dedicated workstation. Calcium scoring of the coronary arteries was performed using the Agatston  method. MEDICATIONS: MEDICATIONS None FINDINGS: Coronary arteries: Normal origins. Coronary Calcium Score: Left main: 0 Left anterior descending artery: 0 Left circumflex artery: 0 Right coronary artery: 0 Total: 0 Pericardium: Normal. Ascending Aorta: Normal caliber. Non-cardiac: See separate report from [facility]. IMPRESSION: Coronary calcium score of 0 Agatston units. This suggests low risk for future cardiac events. RECOMMENDATIONS: Coronary artery calcium (CAC) score is a strong predictor of incident coronary heart disease (CHD) and provides predictive information beyond traditional risk factors. CAC scoring is reasonable to use in the decision to withhold, postpone, or initiate statin therapy in intermediate-risk or selected borderline-risk asymptomatic adults (age 74-75 years and LDL-C >=70 to <190 mg/dL) who do not have diabetes or established atherosclerotic cardiovascular disease (ASCVD).* In intermediate-risk (10-year ASCVD risk >=7.5% to <20%) adults or selected borderline-risk (10-year ASCVD risk >=5% to <7.5%) adults in whom a CAC score is measured for the purpose of making a treatment decision the following recommendations have been made: If CAC=0, it is reasonable to withhold statin therapy and reassess in 5 to 10 years, as long as higher risk conditions are absent (diabetes mellitus, family history of premature CHD in first degree relatives (males <55 years; females <65 years), cigarette smoking, or LDL >=190 mg/dL). If CAC is 1 to 99, it is reasonable to initiate statin therapy for patients >=80 years of age. If CAC is >=100 or >=75th percentile, it is reasonable to initiate statin therapy at any age. Cardiology referral should be considered for patients with CAC scores >=400 or >=75th percentile. *2018 AHA/ACC/AACVPR/AAPA/ABC/ACPM/ADA/AGS/APhA/ASPC/NLA/PCNA Guideline on the Management of Blood Cholesterol: A Report of the American College of Cardiology/American Heart Association Task  Force on Clinical Practice Guidelines. J Am Coll Cardiol. 2019;73(24):3168-3209. Marca Ancona, MD Electronically Signed   By: Marca Ancona M.D.   On: 06/02/2023 16:53    CT CARDIAC SCORING (SELF PAY ONLY)  Result Date: 06/02/2023 CLINICAL DATA:  Cardiovascular Disease Risk stratification EXAM: Coronary Calcium Score TECHNIQUE: A gated, non-contrast computed tomography scan of the heart was performed using 3mm slice thickness. Axial images were analyzed on a dedicated workstation. Calcium scoring of the coronary arteries was performed using the Agatston method. MEDICATIONS: MEDICATIONS None FINDINGS: Coronary arteries: Normal origins. Coronary Calcium Score: Left main: 0 Left anterior descending artery: 0 Left circumflex artery: 0 Right coronary artery: 0 Total: 0 Pericardium: Normal. Ascending Aorta: Normal caliber. Non-cardiac: See separate report from [facility]. IMPRESSION: Coronary calcium score of 0 Agatston units. This suggests low risk for future cardiac events. RECOMMENDATIONS: Coronary  artery calcium (CAC) score is a strong predictor of incident coronary heart disease (CHD) and provides predictive information beyond traditional risk factors. CAC scoring is reasonable to use in the decision to withhold, postpone, or initiate statin therapy in intermediate-risk or selected borderline-risk asymptomatic adults (age 48-75 years and LDL-C >=70 to <190 mg/dL) who do not have diabetes or established atherosclerotic cardiovascular disease (ASCVD).* In intermediate-risk (10-year ASCVD risk >=7.5% to <20%) adults or selected borderline-risk (10-year ASCVD risk >=5% to <7.5%) adults in whom a CAC score is measured for the purpose of making a treatment decision the following recommendations have been made: If CAC=0, it is reasonable to withhold statin therapy and reassess in 5 to 10 years, as long as higher risk conditions are absent (diabetes mellitus, family history of premature CHD in first degree  relatives (males <55 years; females <65 years), cigarette smoking, or LDL >=190 mg/dL). If CAC is 1 to 99, it is reasonable to initiate statin therapy for patients >=9 years of age. If CAC is >=100 or >=75th percentile, it is reasonable to initiate statin therapy at any age. Cardiology referral should be considered for patients with CAC scores >=400 or >=75th percentile. *2018 AHA/ACC/AACVPR/AAPA/ABC/ACPM/ADA/AGS/APhA/ASPC/NLA/PCNA Guideline on the Management of Blood Cholesterol: A Report of the American College of Cardiology/American Heart Association Task Force on Clinical Practice Guidelines. J Am Coll Cardiol. 2019;73(24):3168-3209. Marca Ancona, MD Electronically Signed   By: Marca Ancona M.D.   On: 06/02/2023 16:53       Additional Info: This encounter employed real-time, collaborative documentation. The patient actively reviewed and updated their medical record on a shared screen, ensuring transparency and facilitating joint problem-solving for the problem list, overview, and plan. This approach promotes accurate, informed care. The treatment plan was discussed and reviewed in detail, including medication safety, potential side effects, and all patient questions. We confirmed understanding and comfort with the plan. Follow-up instructions were established, including contacting the office for any concerns, returning if symptoms worsen, persist, or new symptoms develop, and precautions for potential emergency department visits.

## 2023-06-07 NOTE — Assessment & Plan Note (Signed)
The NSVT is likely due to age-related stiffening of the heart, and she exhibits no current symptoms. Management will remain unchanged, with continued monitoring.

## 2023-06-07 NOTE — Assessment & Plan Note (Signed)
She will discuss with her orthopedist

## 2023-06-29 ENCOUNTER — Other Ambulatory Visit (HOSPITAL_BASED_OUTPATIENT_CLINIC_OR_DEPARTMENT_OTHER): Payer: Self-pay | Admitting: Internal Medicine

## 2023-06-30 ENCOUNTER — Encounter: Payer: Self-pay | Admitting: Internal Medicine

## 2023-06-30 ENCOUNTER — Inpatient Hospital Stay: Payer: Medicare HMO

## 2023-06-30 ENCOUNTER — Encounter: Payer: Self-pay | Admitting: Hematology and Oncology

## 2023-06-30 ENCOUNTER — Inpatient Hospital Stay: Payer: Medicare HMO | Attending: Hematology and Oncology | Admitting: Hematology and Oncology

## 2023-06-30 VITALS — BP 162/88 | HR 69 | Temp 97.7°F | Resp 17 | Wt 184.2 lb

## 2023-06-30 DIAGNOSIS — Z860100 Personal history of colon polyps, unspecified: Secondary | ICD-10-CM | POA: Diagnosis not present

## 2023-06-30 DIAGNOSIS — Z8261 Family history of arthritis: Secondary | ICD-10-CM | POA: Diagnosis not present

## 2023-06-30 DIAGNOSIS — Z79810 Long term (current) use of selective estrogen receptor modulators (SERMs): Secondary | ICD-10-CM | POA: Insufficient documentation

## 2023-06-30 DIAGNOSIS — Z87891 Personal history of nicotine dependence: Secondary | ICD-10-CM | POA: Diagnosis not present

## 2023-06-30 DIAGNOSIS — Z85038 Personal history of other malignant neoplasm of large intestine: Secondary | ICD-10-CM | POA: Diagnosis not present

## 2023-06-30 DIAGNOSIS — I10 Essential (primary) hypertension: Secondary | ICD-10-CM | POA: Insufficient documentation

## 2023-06-30 DIAGNOSIS — Z8719 Personal history of other diseases of the digestive system: Secondary | ICD-10-CM | POA: Diagnosis not present

## 2023-06-30 DIAGNOSIS — Z87440 Personal history of urinary (tract) infections: Secondary | ICD-10-CM | POA: Insufficient documentation

## 2023-06-30 DIAGNOSIS — Z882 Allergy status to sulfonamides status: Secondary | ICD-10-CM | POA: Diagnosis not present

## 2023-06-30 DIAGNOSIS — Z79899 Other long term (current) drug therapy: Secondary | ICD-10-CM | POA: Diagnosis not present

## 2023-06-30 DIAGNOSIS — Z923 Personal history of irradiation: Secondary | ICD-10-CM | POA: Insufficient documentation

## 2023-06-30 DIAGNOSIS — Z823 Family history of stroke: Secondary | ICD-10-CM | POA: Insufficient documentation

## 2023-06-30 DIAGNOSIS — Z809 Family history of malignant neoplasm, unspecified: Secondary | ICD-10-CM | POA: Insufficient documentation

## 2023-06-30 DIAGNOSIS — Z170 Estrogen receptor positive status [ER+]: Secondary | ICD-10-CM

## 2023-06-30 DIAGNOSIS — Z880 Allergy status to penicillin: Secondary | ICD-10-CM | POA: Diagnosis not present

## 2023-06-30 DIAGNOSIS — K219 Gastro-esophageal reflux disease without esophagitis: Secondary | ICD-10-CM | POA: Insufficient documentation

## 2023-06-30 DIAGNOSIS — C50411 Malignant neoplasm of upper-outer quadrant of right female breast: Secondary | ICD-10-CM

## 2023-06-30 DIAGNOSIS — Z803 Family history of malignant neoplasm of breast: Secondary | ICD-10-CM | POA: Insufficient documentation

## 2023-06-30 DIAGNOSIS — Z841 Family history of disorders of kidney and ureter: Secondary | ICD-10-CM | POA: Diagnosis not present

## 2023-06-30 DIAGNOSIS — Z8249 Family history of ischemic heart disease and other diseases of the circulatory system: Secondary | ICD-10-CM | POA: Insufficient documentation

## 2023-06-30 DIAGNOSIS — Z90710 Acquired absence of both cervix and uterus: Secondary | ICD-10-CM | POA: Insufficient documentation

## 2023-06-30 DIAGNOSIS — Z87442 Personal history of urinary calculi: Secondary | ICD-10-CM | POA: Diagnosis not present

## 2023-06-30 DIAGNOSIS — Z853 Personal history of malignant neoplasm of breast: Secondary | ICD-10-CM | POA: Diagnosis not present

## 2023-06-30 DIAGNOSIS — Z9049 Acquired absence of other specified parts of digestive tract: Secondary | ICD-10-CM | POA: Insufficient documentation

## 2023-06-30 LAB — CMP (CANCER CENTER ONLY)
ALT: 12 U/L (ref 0–44)
AST: 20 U/L (ref 15–41)
Albumin: 3.9 g/dL (ref 3.5–5.0)
Alkaline Phosphatase: 80 U/L (ref 38–126)
Anion gap: 5 (ref 5–15)
BUN: 10 mg/dL (ref 8–23)
CO2: 28 mmol/L (ref 22–32)
Calcium: 9.6 mg/dL (ref 8.9–10.3)
Chloride: 107 mmol/L (ref 98–111)
Creatinine: 0.75 mg/dL (ref 0.44–1.00)
GFR, Estimated: >60 mL/min (ref >60)
Glucose, Bld: 83 mg/dL (ref 70–99)
Potassium: 3.7 mmol/L (ref 3.5–5.1)
Sodium: 140 mmol/L (ref 135–145)
Total Bilirubin: 0.5 mg/dL (ref 0.3–1.2)
Total Protein: 7 g/dL (ref 6.5–8.1)

## 2023-06-30 LAB — CBC WITH DIFFERENTIAL/PLATELET
Abs Immature Granulocytes: 0 10*3/uL (ref 0.00–0.07)
Basophils Absolute: 0 10*3/uL (ref 0.0–0.1)
Basophils Relative: 1 %
Eosinophils Absolute: 0.1 10*3/uL (ref 0.0–0.5)
Eosinophils Relative: 3 %
HCT: 36.4 % (ref 36.0–46.0)
Hemoglobin: 12.3 g/dL (ref 12.0–15.0)
Immature Granulocytes: 0 %
Lymphocytes Relative: 52 %
Lymphs Abs: 1.7 10*3/uL (ref 0.7–4.0)
MCH: 31.9 pg (ref 26.0–34.0)
MCHC: 33.8 g/dL (ref 30.0–36.0)
MCV: 94.3 fL (ref 80.0–100.0)
Monocytes Absolute: 0.5 10*3/uL (ref 0.1–1.0)
Monocytes Relative: 16 %
Neutro Abs: 0.9 10*3/uL — ABNORMAL LOW (ref 1.7–7.7)
Neutrophils Relative %: 28 %
Platelets: 160 10*3/uL (ref 150–400)
RBC: 3.86 MIL/uL — ABNORMAL LOW (ref 3.87–5.11)
RDW: 12.4 % (ref 11.5–15.5)
WBC: 3.2 10*3/uL — ABNORMAL LOW (ref 4.0–10.5)
nRBC: 0 % (ref 0.0–0.2)

## 2023-06-30 NOTE — Progress Notes (Signed)
[facility]  [phone] [phone]     ID: [name] DOB: 02-20-1951  MR#: [id number]  UVO#:[id number]  Patient Care Team: Lula Olszewski, MD as PCP - General (Internal Medicine) Chilton Si, MD as PCP - Cardiology (Cardiology) Zenovia Jordan, MD as Consulting Physician (Rheumatology) Meredith Pel, NP as Nurse Practitioner (Gastroenterology) Betha Loa, MD as Consulting Physician (Orthopedic Surgery) Rachel Moulds, MD as Consulting Physician (Hematology and Oncology) Meryl Dare, MD as Consulting Physician (Gastroenterology)  CHIEF COMPLAINT: Estrogen receptor positive breast cancer  CURRENT TREATMENT: tamoxifen   INTERVAL HISTORY:  [name] returns today for follow-up of her estrogen receptor positive breast cancer.  She completed 10 years of tamoxifen and is here for follow-up.    Discussed the use of AI scribe software for clinical note transcription with the patient, who gave verbal consent to proceed.  The patient, with a history of breast cancer presents for a follow-up visit.  She complained of stomach pain during her last visit. The stomach pain has resolved with over-the-counter Maalox. She has a mammogram scheduled for October 23rd. She had a CT abdomen which was normal, but due to a suspicious finding, she underwent an endoscopy which was also normal. She has not had to return to the hospital or see any other specialists since the last visit. She is no longer taking melatonin and potassium. She is enjoying doing art work, volunteering, Oncologist.  COVID 19 VACCINATION STATUS: Pfizer x3, most recently 07/2020; infection 03/25/2021  BREAST CANCER HISTORY: From Dr. Feliz Beam earlier summary:  "#1 patient is status post lumpectomy with sentinel lymph node biopsy followed by radiation to the right breast. In December 2009 patient began aromatase inhibitor but could not tolerate it.  #2 therefore she  began in March 2010 tamoxifen 20 mg daily. However she has experienced significant amount of aches and pains and in December 2012 discontinued it. At this time she does not want to go back on any kind of antiestrogen therapy due to side effects.  #3 patient would like to go back on tamoxifen 20 mg daily. She recently attended finding your new normal seminar. And because of that she wants to she would like to go back on the tamoxifen. We discussed side effects again. She was sent to her pharmacy."  Her subsequent history is as detailed below   PAST MEDICAL HISTORY: Past Medical History:  Diagnosis Date   Acute pain of right shoulder 11/01/2022   Aortic atherosclerosis (HCC) 11/01/2022   On 2022 CT abdomen.   Arthritis    R knee, hands    Back pain    Blood transfusion    1986- post-partial hysterectomy   Breast cancer (HCC) 2009   right lumpectomy and radiation   Cataract    Chicken pox    Colon cancer (HCC) 2003   per pt report, cancer found in a colon polyp in 2003, procedure done in [place].  did not require surgery, chemo etc.     Depression    Difficulty standing 09/08/2022   Due to hip pain mainly- she agreed to physical therapy if insurance will cover   Diverticular disease 11/01/2022   On 2022 CT abd   Diverticulitis    Edema of both ankles    Family history of breast cancer 10/24/2017   Flank pain 11/03/2007   Urgent care told UTI responsible but she reports she didn't have one.  History of kidney stones and kind of feels like that  Started 3 weeks ago            Gallbladder problem    Gastritis    Generalized abdominal pain 12/15/2022   GERD (gastroesophageal reflux disease)    recently taken off anti reflux tx   Hepatitis    Hep. A- 1978   Hiatal hernia    History of stomach ulcers    Hypertension    Joint pain    Lactose intolerance    Lower extremity edema 01/11/2022   Lupus (HCC)    Multiple gastric ulcers    Palpitations    Personal history of radiation  therapy 2009   Prediabetes 09/08/2022   Lab Results Component Value Date  HGBA1C 5.7 (H) 06/29/2021     Rheumatic fever    Rheumatoid arthritis (HCC)    SLE (systemic lupus erythematosus related syndrome) (HCC)    Urinary incontinence    UTI (urinary tract infection)     PAST SURGICAL HISTORY: Past Surgical History:  Procedure Laterality Date   ABDOMINAL HYSTERECTOMY     1986   APPENDECTOMY     1978 & tubal ligation    BIOPSY  07/25/2018   Procedure: BIOPSY;  Surgeon: Rachael Fee, MD;  Location: [facility] ENDOSCOPY;  Service: Endoscopy;;   BREAST EXCISIONAL BIOPSY Left 1998   BREAST LUMPECTOMY Bilateral    2009   BREAST SURGERY     R breast lumpectomy   CHOLECYSTECTOMY     1978- open    ESOPHAGOGASTRODUODENOSCOPY (EGD) WITH PROPOFOL N/A 07/25/2018   Procedure: ESOPHAGOGASTRODUODENOSCOPY (EGD) WITH PROPOFOL;  Surgeon: Rachael Fee, MD;  Location: [facility] ENDOSCOPY;  Service: Endoscopy;  Laterality: N/A;   REPLACEMENT TOTAL KNEE Left    TONSILLECTOMY     1954   TOTAL KNEE ARTHROPLASTY  08/30/2011   Procedure: TOTAL KNEE ARTHROPLASTY;  Surgeon: Nestor Lewandowsky;  Location: [facility] OR;  Service: Orthopedics;  Laterality: Right;  Right Total Knee Arthroplasty   TUBAL LIGATION      FAMILY HISTORY Family History  Problem Relation Age of Onset   Breast cancer Mother 36   Hypertension Mother    Stroke Mother    Depression Mother    Heart attack Father    Other Father    Heart disease Father    Sudden death Father    Hypertension Sister    Hypertension Brother    Kidney disease Brother    Other Maternal Grandmother 102       brain tumor dx at 34- did not bx or do additional testing on tumor   Arthritis Maternal Grandmother    Cancer Paternal Grandmother 79       type unk   Breast cancer Maternal Aunt 79   Cancer Paternal Aunt 62       type unk   Cancer Other    Hypertension Other    Anesthesia problems Neg Hx    Hypotension Neg Hx    Malignant hyperthermia Neg Hx     Pseudochol deficiency Neg Hx    Colon cancer Neg Hx    Esophageal cancer Neg Hx    Stomach cancer Neg Hx    Pancreatic cancer Neg Hx    Liver disease Neg Hx    the patient's father died from a heart attack at the age of 23. The patient's mother died at the age of 29. She had a history of breast cancer and one of her sisters was diagnosed with breast cancer at the age of 65. The patient had 5  brothers and 5 sisters. There is no other history of breast or ovarian cancer in the family to her knowledge.   GYNECOLOGIC HISTORY:  No LMP recorded. Patient has had a hysterectomy. Menarche age 66, first live birth age 64. The patient is GX P3. She had a simple hysterectomy in 1986 (no salpingo-oophorectomy).   SOCIAL HISTORY: (Updated February 2022). 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  She remarried in 2011. Her husband Alfredo Bach is retired from [employer]. The patient has one adopted son in addition to her own children. 3 of her children live in town 1 in [place]. She has 7 grandchildren. She attends [employer] locally.    ADVANCED DIRECTIVES: Not in place   HEALTH MAINTENANCE: Social History   Tobacco Use   Smoking status: Never   Smokeless tobacco: Never  Vaping Use   Vaping status: Never Used  Substance Use Topics   Alcohol use: No   Drug use: No     Colonoscopy:  PAP:  Bone density: 04/30/2008 was normal  Lipid panel:  Allergies  Allergen Reactions   Iodine Anaphylaxis   Iohexol Anaphylaxis     Code: HIVES, Desc: PT IS ALLERGIC TO IODINATED CONTRAST; REACTION INCLUDES FACIAL SWELLING,HIVES,RESPIRATORY DISTRESS;PT HASN'T HAD SCAN W/PREMEDS.;REFUSED CONTRAST OF ANY KIND!  KR    Latex Anaphylaxis   Penicillins Anaphylaxis and Rash    Has patient had a PCN reaction causing immediate rash, facial/tongue/throat  swelling, SOB or lightheadedness with hypotension: Yes Has patient had a PCN reaction causing severe rash involving mucus membranes or skin necrosis: Yes Has patient had a PCN reaction that required hospitalization: No Has patient had a PCN reaction occurring within the last 10 years: No If all of the above answers are "NO", then may proceed with Cephalosporin use.    Celecoxib Other (See Comments)    Severe stomach pain   Lactose Other (See Comments)    Lactose intolerant-cramping  Lactose intolerant   Other Other (See Comments)   Penicillin G Other (See Comments)   Sulfa Antibiotics Hives    Current Outpatient Medications  Medication Sig Dispense Refill   busPIRone (BUSPAR) 10 MG tablet Take 1 tablet (10 mg total) by mouth 2 (two) times daily. 180 tablet 3   hydrochlorothiazide (HYDRODIURIL) 25 MG tablet TAKE 1 TABLET (25 MG TOTAL) BY MOUTH DAILY. 90 tablet 3   hydroxychloroquine (PLAQUENIL) 200 MG tablet Take 200 mg by mouth 2 (two) times daily.     losartan (COZAAR) 25 MG tablet Take 1 tablet (25 mg total) by mouth daily. 30 tablet 0   Melatonin 1 MG CAPS Take 1 capsule (1 mg total) by mouth at bedtime. 90 capsule 30   metoprolol succinate (TOPROL-XL) 50 MG 24 hr tablet Take 1 tablet (50 mg total) by mouth daily. 90 tablet 3   Multiple Vitamins-Minerals (EMERGEN-C IMMUNE PLUS PO) Take 1 tablet by mouth daily.     potassium chloride (KLOR-CON) 10 MEQ tablet TAKE 1 TABLET BY MOUTH EVERY DAY 90 tablet 1   No current facility-administered medications for this visit.    OBJECTIVE: African-American woman in no acute distress  There were no vitals filed for this visit.     There is no height or weight on file to calculate BMI.    ECOG FS:1 - Symptomatic but completely ambulatory  Physical Exam Constitutional:      Appearance: Normal appearance.  Cardiovascular:     Rate and Rhythm: Normal rate and regular rhythm.  Chest:     Comments: Bilateral breast inspected and palpated.   No palpable masses or regional adenopathy. Musculoskeletal:     Cervical back: Normal range of motion and neck supple. No rigidity.  Lymphadenopathy:     Cervical: No cervical adenopathy.  Neurological:     Mental Status: She is alert.       LAB RESULTS:  CMP     Component Value Date/Time   NA 136 05/05/2023 1147   NA 141 07/14/2022 1338   NA 141 01/26/2017 1508   K 4.0 05/05/2023 1147   K 3.4 (L) 01/26/2017 1508   CL 102 05/05/2023 1147   CL 102 01/25/2013 0909   CO2 26 05/05/2023 1147   CO2 29 01/26/2017 1508   GLUCOSE 79 05/05/2023 1147   GLUCOSE 91 01/26/2017 1508   GLUCOSE 81 01/25/2013 0909   BUN 16 05/05/2023 1147   BUN 19 07/14/2022 1338   BUN 11.5 01/26/2017 1508   CREATININE 0.73 05/05/2023 1147   CREATININE 0.78 06/29/2022 1333   CREATININE 0.8 01/26/2017 1508   CALCIUM 9.9 05/05/2023 1147   CALCIUM 9.5 01/26/2017 1508   PROT 6.9 05/05/2023 1147   PROT 7.1 01/26/2017 1508   ALBUMIN 4.0 05/05/2023 1147   ALBUMIN 3.5 01/26/2017 1508   AST 19 05/05/2023 1147   AST 22 06/29/2022 1333   AST 22 01/26/2017 1508   ALT 12 05/05/2023 1147   ALT 16 06/29/2022 1333   ALT 17 01/26/2017 1508   ALKPHOS 88 05/05/2023 1147   ALKPHOS 72 01/26/2017 1508   BILITOT 0.4 05/05/2023 1147   BILITOT 0.5 06/29/2022 1333   BILITOT 0.38 01/26/2017 1508   GFRNONAA >60 06/29/2022 1333   GFRAA 91 09/04/2020 1037   GFRAA >60 02/15/2019 1436    INo results found for: "SPEP", "UPEP"  Lab Results  Component Value Date   WBC 4.3 05/05/2023   NEUTROABS 1.5 05/05/2023   HGB 12.4 05/05/2023   HCT 37.7 05/05/2023   MCV 94.8 05/05/2023   PLT 185.0 05/05/2023      Chemistry      Component Value Date/Time   NA 136 05/05/2023 1147   NA 141 07/14/2022 1338   NA 141 01/26/2017 1508   K 4.0 05/05/2023 1147   K 3.4 (L) 01/26/2017 1508   CL 102 05/05/2023 1147   CL 102 01/25/2013 0909   CO2 26 05/05/2023 1147   CO2 29 01/26/2017 1508   BUN 16 05/05/2023 1147   BUN 19  07/14/2022 1338   BUN 11.5 01/26/2017 1508   CREATININE 0.73 05/05/2023 1147   CREATININE 0.78 06/29/2022 1333   CREATININE 0.8 01/26/2017 1508      Component Value Date/Time   CALCIUM 9.9 05/05/2023 1147   CALCIUM 9.5 01/26/2017 1508   ALKPHOS 88 05/05/2023 1147   ALKPHOS 72 01/26/2017 1508   AST 19 05/05/2023 1147   AST 22 06/29/2022 1333   AST 22 01/26/2017 1508   ALT 12 05/05/2023 1147   ALT 16 06/29/2022 1333   ALT 17 01/26/2017 1508   BILITOT 0.4 05/05/2023 1147   BILITOT 0.5 06/29/2022 1333   BILITOT 0.38 01/26/2017 1508       Lab Results  Component Value Date   LABCA2 16 11/09/2010    No components found for: "LABCA125"  No results for input(s): "INR" in the last  168 hours.  Urinalysis    Component Value Date/Time   COLORURINE YELLOW 09/08/2022 1237   APPEARANCEUR CLEAR 09/08/2022 1237   LABSPEC 1.015 09/08/2022 1237   LABSPEC 1.015 01/26/2017 1523   PHURINE 6.5 09/08/2022 1237   GLUCOSEU NEGATIVE 09/08/2022 1237   GLUCOSEU Negative 01/26/2017 1523   HGBUR NEGATIVE 09/08/2022 1237   HGBUR negative 01/22/2010 1551   BILIRUBINUR NEGATIVE 09/08/2022 1237   BILIRUBINUR Negative 01/26/2017 1523   KETONESUR NEGATIVE 09/08/2022 1237   PROTEINUR NEGATIVE 06/29/2022 1313   UROBILINOGEN 1.0 09/08/2022 1237   UROBILINOGEN 0.2 01/26/2017 1523   NITRITE NEGATIVE 09/08/2022 1237   LEUKOCYTESUR NEGATIVE 09/08/2022 1237   LEUKOCYTESUR Small 01/26/2017 1523    STUDIES: CT CARDIAC SCORING (SELF PAY ONLY)  Addendum Date: 06/17/2023   ADDENDUM REPORT: 06/17/2023 23:18 EXAM: OVER-READ INTERPRETATION  CT CHEST The following report is an over-read performed by radiologist Dr. Alcide Clever of [facility] on 06/17/2023. This over-read does not include interpretation of cardiac or coronary anatomy or pathology. The coronary calcium score interpretation by the cardiologist is attached. COMPARISON:  None. FINDINGS: Cardiovascular: There are no significant  extracardiac vascular findings. Mediastinum/Nodes: There are no enlarged lymph nodes within the visualized mediastinum. Lungs/Pleura: There is no pleural effusion. The visualized lungs appear clear. Upper abdomen: No significant findings in the visualized upper abdomen. Musculoskeletal/Chest wall: No chest wall mass or suspicious osseous findings within the visualized chest. IMPRESSION: No significant extracardiac findings within the visualized chest. Electronically Signed   By: Alcide Clever M.D.   On: 06/17/2023 23:18   Result Date: 06/17/2023 CLINICAL DATA:  Cardiovascular Disease Risk stratification EXAM: Coronary Calcium Score TECHNIQUE: A gated, non-contrast computed tomography scan of the heart was performed using 3mm slice thickness. Axial images were analyzed on a dedicated workstation. Calcium scoring of the coronary arteries was performed using the Agatston method. MEDICATIONS: MEDICATIONS None FINDINGS: Coronary arteries: Normal origins. Coronary Calcium Score: Left main: 0 Left anterior descending artery: 0 Left circumflex artery: 0 Right coronary artery: 0 Total: 0 Pericardium: Normal. Ascending Aorta: Normal caliber. Non-cardiac: See separate report from [facility]. IMPRESSION: Coronary calcium score of 0 Agatston units. This suggests low risk for future cardiac events. RECOMMENDATIONS: Coronary artery calcium (CAC) score is a strong predictor of incident coronary heart disease (CHD) and provides predictive information beyond traditional risk factors. CAC scoring is reasonable to use in the decision to withhold, postpone, or initiate statin therapy in intermediate-risk or selected borderline-risk asymptomatic adults (age 58-75 years and LDL-C >=70 to <190 mg/dL) who do not have diabetes or established atherosclerotic cardiovascular disease (ASCVD).* In intermediate-risk (10-year ASCVD risk >=7.5% to <20%) adults or selected borderline-risk (10-year ASCVD risk >=5% to <7.5%) adults in whom a  CAC score is measured for the purpose of making a treatment decision the following recommendations have been made: If CAC=0, it is reasonable to withhold statin therapy and reassess in 5 to 10 years, as long as higher risk conditions are absent (diabetes mellitus, family history of premature CHD in first degree relatives (males <55 years; females <65 years), cigarette smoking, or LDL >=190 mg/dL). If CAC is 1 to 99, it is reasonable to initiate statin therapy for patients >=61 years of age. If CAC is >=100 or >=75th percentile, it is reasonable to initiate statin therapy at any age. Cardiology referral should be considered for patients with CAC scores >=400 or >=75th percentile. *2018 AHA/ACC/AACVPR/AAPA/ABC/ACPM/ADA/AGS/APhA/ASPC/NLA/PCNA Guideline on the Management of Blood Cholesterol: A Report of the [employer] of Cardiology/American Heart  Association Task Force on Clinical Practice Guidelines. J Am Coll Cardiol. 2019;73(24):3168-3209. Marca Ancona, MD Electronically Signed: By: Marca Ancona M.D. On: 06/02/2023 16:53      ASSESSMENT: 72 y.o. 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  (2) status post right lumpectomy 03/20/2008 for invasive lobular carcinoma, multifocal, pT1a pNX, stage IA, grade 2, strongly estrogen and progesterone receptor positive, HER-2 negative. with negative margins.  (3) status post right axillary sentinel lymph node sampling 04/17/2008, the single sentinel lymph node being clear  (4) Oncotype score of 10 predicts an outside the breast risk of recurrence of 7% if the patient's only systemic therapy is tamoxifen for 5 years.  (5) Additional right breast biopsy 05/23/2008 to evaluate microcalcifications seen on pre-radiotherapy mammography showed only atypical lobular hyperplasia  (6) Completed  radiation to the right breast 07/26/2008 (50 gray +14 gray boost).  (7) Started on anastrozole November 2009, with poor tolerance  (8). Started on tamoxifen March 2010, continued to December 2012, resumed December 2014   (a) tamoxifen discontinued February 2022  (9) status post simple hysterectomy 1986 (no salpingo-oophorectomy)  (10) genetics testing 10/31/2017 through the Common Hereditary Cancers Panel offered by [facility] found no deleterious mutations in APC, ATM, AXIN2, BARD1, BMPR1A, BRCA1, BRCA2, BRIP1, CDH1, CDKN2A (p14ARF), CDKN2A (p16INK4a), CKD4, CHEK2, CTNNA1, DICER1, EPCAM (Deletion/duplication testing only), GREM1 (promoter region deletion/duplication testing only), KIT, MEN1, MLH1, MSH2, MSH3, MSH6, MUTYH, NBN, NF1, NHTL1, PALB2, PDGFRA, PMS2, POLD1, POLE, PTEN, RAD50, RAD51C, RAD51D, SDHB, SDHC, SDHD, SMAD4, SMARCA4. STK11, TP53, TSC1, TSC2, and VHL.  The following genes were evaluated for sequence changes only: SDHA and HOXB13 c.251G>A variant only.  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    PLAN:  Shirl is now 15 years out from definitive surgery for her breast cancer with no evidence of disease recurrence.  This is very favorable. She completed more than 10 years of tamoxifen.  She tolerated it very well.  At this time there is no clinical concern for recurrence.  Her last mammogram without any evidence of recurrence. Mammogram due Oct 2024, scheduled for October 23  Abdominal Pain Resolved with over-the-counter Maalox. -No further intervention required at this time.  Breast Examination No abnormalities detected during physical examination. -Continue with scheduled mammogram on July 13, 2023.  Medication Review Melatonin and potassium discontinued. -Continue with remaining medications as prescribed.  General Health Maintenance / Followup Plans -Continue with regular health checks and screenings. -Report any new  symptoms or concerns promptly.   Total time spent: 30 minutes,   *Total Encounter Time as defined by the Centers for Medicare and Medicaid Services includes, in addition to the face-to-face time of a patient visit (documented in the note above) non-face-to-face time: obtaining and reviewing outside history, ordering and reviewing medications, tests or procedures, care coordination (communications with other health care professionals or caregivers) and documentation in the medical record.

## 2023-07-08 DIAGNOSIS — M79642 Pain in left hand: Secondary | ICD-10-CM | POA: Diagnosis not present

## 2023-07-08 DIAGNOSIS — Z79899 Other long term (current) drug therapy: Secondary | ICD-10-CM | POA: Diagnosis not present

## 2023-07-08 DIAGNOSIS — M329 Systemic lupus erythematosus, unspecified: Secondary | ICD-10-CM | POA: Diagnosis not present

## 2023-07-08 DIAGNOSIS — M79641 Pain in right hand: Secondary | ICD-10-CM | POA: Diagnosis not present

## 2023-07-08 DIAGNOSIS — E663 Overweight: Secondary | ICD-10-CM | POA: Diagnosis not present

## 2023-07-08 DIAGNOSIS — M1991 Primary osteoarthritis, unspecified site: Secondary | ICD-10-CM | POA: Diagnosis not present

## 2023-07-08 DIAGNOSIS — Z6828 Body mass index (BMI) 28.0-28.9, adult: Secondary | ICD-10-CM | POA: Diagnosis not present

## 2023-07-13 ENCOUNTER — Ambulatory Visit: Payer: Medicare HMO

## 2023-07-15 DIAGNOSIS — H2513 Age-related nuclear cataract, bilateral: Secondary | ICD-10-CM | POA: Diagnosis not present

## 2023-07-15 DIAGNOSIS — Z79899 Other long term (current) drug therapy: Secondary | ICD-10-CM | POA: Diagnosis not present

## 2023-07-15 DIAGNOSIS — H5213 Myopia, bilateral: Secondary | ICD-10-CM | POA: Diagnosis not present

## 2023-07-18 ENCOUNTER — Ambulatory Visit: Payer: Medicare HMO | Admitting: Internal Medicine

## 2023-07-19 ENCOUNTER — Ambulatory Visit (INDEPENDENT_AMBULATORY_CARE_PROVIDER_SITE_OTHER): Payer: Medicare HMO

## 2023-07-19 DIAGNOSIS — Z23 Encounter for immunization: Secondary | ICD-10-CM

## 2023-07-22 ENCOUNTER — Emergency Department (HOSPITAL_BASED_OUTPATIENT_CLINIC_OR_DEPARTMENT_OTHER): Payer: Medicare HMO

## 2023-07-22 ENCOUNTER — Other Ambulatory Visit: Payer: Self-pay

## 2023-07-22 ENCOUNTER — Emergency Department (HOSPITAL_BASED_OUTPATIENT_CLINIC_OR_DEPARTMENT_OTHER)
Admission: EM | Admit: 2023-07-22 | Discharge: 2023-07-22 | Disposition: A | Discharge status: Home / Self Care | Payer: Medicare HMO | Attending: Emergency Medicine | Admitting: Emergency Medicine

## 2023-07-22 DIAGNOSIS — Z1152 Encounter for screening for COVID-19: Secondary | ICD-10-CM | POA: Insufficient documentation

## 2023-07-22 DIAGNOSIS — D849 Immunodeficiency, unspecified: Secondary | ICD-10-CM | POA: Diagnosis not present

## 2023-07-22 DIAGNOSIS — R112 Nausea with vomiting, unspecified: Secondary | ICD-10-CM | POA: Diagnosis not present

## 2023-07-22 DIAGNOSIS — J069 Acute upper respiratory infection, unspecified: Secondary | ICD-10-CM | POA: Diagnosis not present

## 2023-07-22 DIAGNOSIS — M791 Myalgia, unspecified site: Secondary | ICD-10-CM | POA: Insufficient documentation

## 2023-07-22 DIAGNOSIS — L930 Discoid lupus erythematosus: Secondary | ICD-10-CM | POA: Insufficient documentation

## 2023-07-22 DIAGNOSIS — R5383 Other fatigue: Secondary | ICD-10-CM | POA: Diagnosis not present

## 2023-07-22 DIAGNOSIS — Z0389 Encounter for observation for other suspected diseases and conditions ruled out: Secondary | ICD-10-CM | POA: Diagnosis not present

## 2023-07-22 DIAGNOSIS — Z91040 Latex allergy status: Secondary | ICD-10-CM | POA: Diagnosis not present

## 2023-07-22 DIAGNOSIS — J9811 Atelectasis: Secondary | ICD-10-CM | POA: Diagnosis not present

## 2023-07-22 LAB — URINALYSIS, W/ REFLEX TO CULTURE (INFECTION SUSPECTED)
Bacteria, UA: NONE SEEN
Bilirubin Urine: NEGATIVE
Glucose, UA: NEGATIVE mg/dL
Ketones, ur: NEGATIVE mg/dL
Leukocytes,Ua: NEGATIVE
Nitrite: NEGATIVE
Protein, ur: NEGATIVE mg/dL
Specific Gravity, Urine: 1.012 (ref 1.005–1.030)
pH: 7 (ref 5.0–8.0)

## 2023-07-22 LAB — RESP PANEL BY RT-PCR (RSV, FLU A&B, COVID)  RVPGX2
Influenza A by PCR: NEGATIVE
Influenza B by PCR: NEGATIVE
Resp Syncytial Virus by PCR: NEGATIVE
SARS Coronavirus 2 by RT PCR: NEGATIVE

## 2023-07-22 LAB — CBC WITH DIFFERENTIAL/PLATELET
Abs Immature Granulocytes: 0.01 10*3/uL (ref 0.00–0.07)
Basophils Absolute: 0 10*3/uL (ref 0.0–0.1)
Basophils Relative: 0 %
Eosinophils Absolute: 0.1 10*3/uL (ref 0.0–0.5)
Eosinophils Relative: 3 %
HCT: 37 % (ref 36.0–46.0)
Hemoglobin: 12.4 g/dL (ref 12.0–15.0)
Immature Granulocytes: 0 %
Lymphocytes Relative: 26 %
Lymphs Abs: 1.2 10*3/uL (ref 0.7–4.0)
MCH: 30.8 pg (ref 26.0–34.0)
MCHC: 33.5 g/dL (ref 30.0–36.0)
MCV: 91.8 fL (ref 80.0–100.0)
Monocytes Absolute: 0.7 10*3/uL (ref 0.1–1.0)
Monocytes Relative: 15 %
Neutro Abs: 2.5 10*3/uL (ref 1.7–7.7)
Neutrophils Relative %: 56 %
Platelets: 182 10*3/uL (ref 150–400)
RBC: 4.03 MIL/uL (ref 3.87–5.11)
RDW: 12.5 % (ref 11.5–15.5)
WBC: 4.4 10*3/uL (ref 4.0–10.5)
nRBC: 0 % (ref 0.0–0.2)

## 2023-07-22 LAB — COMPREHENSIVE METABOLIC PANEL
ALT: 13 U/L (ref 0–44)
AST: 21 U/L (ref 15–41)
Albumin: 3.9 g/dL (ref 3.5–5.0)
Alkaline Phosphatase: 77 U/L (ref 38–126)
Anion gap: 7 (ref 5–15)
BUN: 13 mg/dL (ref 8–23)
CO2: 26 mmol/L (ref 22–32)
Calcium: 10 mg/dL (ref 8.9–10.3)
Chloride: 105 mmol/L (ref 98–111)
Creatinine, Ser: 0.66 mg/dL (ref 0.44–1.00)
GFR, Estimated: >60 mL/min (ref >60)
Glucose, Bld: 93 mg/dL (ref 70–99)
Potassium: 3.5 mmol/L (ref 3.5–5.1)
Sodium: 138 mmol/L (ref 135–145)
Total Bilirubin: 0.5 mg/dL (ref 0.3–1.2)
Total Protein: 7 g/dL (ref 6.5–8.1)

## 2023-07-22 LAB — PROTIME-INR
INR: 1 (ref 0.8–1.2)
Prothrombin Time: 13.5 s (ref 11.4–15.2)

## 2023-07-22 LAB — GROUP A STREP BY PCR: Group A Strep by PCR: NOT DETECTED

## 2023-07-22 LAB — LACTIC ACID, PLASMA: Lactic Acid, Venous: 0.9 mmol/L (ref 0.5–1.9)

## 2023-07-22 LAB — APTT: aPTT: 27 s (ref 24–36)

## 2023-07-22 MED ORDER — HYDROCODONE-ACETAMINOPHEN 5-325 MG PO TABS
2.0000 | ORAL_TABLET | Freq: Once | ORAL | Status: AC
  Administered 2023-07-22: 2 via ORAL
  Filled 2023-07-22: qty 2

## 2023-07-22 NOTE — ED Triage Notes (Signed)
Pt POV from home reporting flu-like symptoms after flu shot Thursday. Lupus flareup 2 weeks. +n/v, cough, congestion.

## 2023-07-22 NOTE — ED Notes (Signed)
MD at bedside. 

## 2023-07-22 NOTE — ED Notes (Signed)
Patient is resting comfortably. 

## 2023-07-22 NOTE — ED Provider Notes (Signed)
[name] EMERGENCY DEPARTMENT AT [facility] Provider Note   CSN: [id number] Arrival date & time: 07/22/23  1540     History {Add pertinent medical, surgical, social history, OB history to HPI:1} Chief Complaint  Patient presents with   flu like symtoms    [name] is a 72 y.o. female.  She has multiple medical issues.  She said she had a flare of her lupus about 2 weeks ago which involves a rash on her face and body, joint aches.  She talked to her rheumatologist and they increased her hydrochloroquin.  She got a flu vaccination 2 days ago.  Since last night she has felt terrible with bodyaches sneezing cough productive of yellow sputum vomiting and fatigue.  Temperature of 103 yesterday. she does not usually have reactions like this to vaccinations.  No diarrhea no urinary symptoms.  The history is provided by the patient.  Influenza Presenting symptoms: cough, fatigue, fever, headache, myalgias, nausea, rhinorrhea, sore throat and vomiting   Risk factors: immunocompromised state        Home Medications Prior to Admission medications   Medication Sig Start Date End Date Taking? Authorizing Provider  busPIRone (BUSPAR) 10 MG tablet Take 1 tablet (10 mg total) by mouth 2 (two) times daily. 06/07/23   Lula Olszewski, MD  hydrochlorothiazide (HYDRODIURIL) 25 MG tablet TAKE 1 TABLET (25 MG TOTAL) BY MOUTH DAILY. 03/23/23   Chilton Si, MD  hydroxychloroquine (PLAQUENIL) 200 MG tablet Take 200 mg by mouth 2 (two) times daily.    [provider]  losartan (COZAAR) 25 MG tablet Take 1 tablet (25 mg total) by mouth daily. 03/23/23   Chilton Si, MD  metoprolol succinate (TOPROL-XL) 50 MG 24 hr tablet Take 1 tablet (50 mg total) by mouth daily. 05/02/23   Chilton Si, MD  Multiple Vitamins-Minerals (EMERGEN-C IMMUNE PLUS PO) Take 1 tablet by mouth daily.    [provider]      Allergies    Iodine, Iohexol, Latex, Penicillins,  Celecoxib, Lactose, Other, Penicillin g, and Sulfa antibiotics    Review of Systems   Review of Systems  Constitutional:  Positive for fatigue and fever.  HENT:  Positive for rhinorrhea and sore throat.   Respiratory:  Positive for cough.   Cardiovascular:  Negative for chest pain.  Gastrointestinal:  Positive for nausea and vomiting. Negative for abdominal pain.  Genitourinary:  Negative for dysuria.  Musculoskeletal:  Positive for myalgias.  Neurological:  Positive for headaches.    Physical Exam Updated Vital Signs BP (!) 164/98   Pulse 78   Temp 98.7 F (37.1 C)   Resp 20   Ht 5\' 7"  (1.702 m)   Wt 79.4 kg   SpO2 98%   BMI 27.41 kg/m  Physical Exam Vitals and nursing note reviewed.  Constitutional:      General: She is not in acute distress.    Appearance: Normal appearance. She is well-developed.  HENT:     Head: Normocephalic and atraumatic.  Eyes:     Conjunctiva/sclera: Conjunctivae normal.  Cardiovascular:     Rate and Rhythm: Normal rate and regular rhythm.     Heart sounds: No murmur heard. Pulmonary:     Effort: Pulmonary effort is normal. No respiratory distress.     Breath sounds: Normal breath sounds.  Abdominal:     Palpations: Abdomen is soft.     Tenderness: There is no abdominal tenderness. There is no guarding or rebound.  Musculoskeletal:  General: Tenderness (diffuse tenderness) present.     Cervical back: Neck supple.  Skin:    General: Skin is warm and dry.     Capillary Refill: Capillary refill takes less than 2 seconds.  Neurological:     General: No focal deficit present.     Mental Status: She is alert.     ED Results / Procedures / Treatments   Labs (all labs ordered are listed, but only abnormal results are displayed) Labs Reviewed  RESP PANEL BY RT-PCR (RSV, FLU A&B, COVID)  RVPGX2  GROUP A STREP BY PCR  CULTURE, BLOOD (ROUTINE X 2)  CULTURE, BLOOD (ROUTINE X 2)  LACTIC ACID, PLASMA  LACTIC ACID, PLASMA   COMPREHENSIVE METABOLIC PANEL  CBC WITH DIFFERENTIAL/PLATELET  PROTIME-INR  APTT  URINALYSIS, W/ REFLEX TO CULTURE (INFECTION SUSPECTED)    EKG None  Radiology No results found.  Procedures Procedures  {Document cardiac monitor, telemetry assessment procedure when appropriate:1}  Medications Ordered in ED Medications - No data to display  ED Course/ Medical Decision Making/ A&P   {   Click here for ABCD2, HEART and other calculatorsREFRESH Note before signing :1}                              Medical Decision Making Amount and/or Complexity of Data Reviewed Labs: ordered. Radiology: ordered. ECG/medicine tests: ordered.   This patient complains of ***; this involves an extensive number of treatment Options and is a complaint that carries with it a high risk of complications and morbidity. The differential includes ***  I ordered, reviewed and interpreted labs, which included *** I ordered medication *** and reviewed PMP when indicated. I ordered imaging studies which included *** and I independently    visualized and interpreted imaging which showed *** Additional history obtained from *** Previous records obtained and reviewed *** I consulted *** and discussed lab and imaging findings and discussed disposition.  Cardiac monitoring reviewed, *** Social determinants considered, *** Critical Interventions: ***  After the interventions stated above, I reevaluated the patient and found *** Admission and further testing considered, ***   {Document critical care time when appropriate:1} {Document review of labs and clinical decision tools ie heart score, Chads2Vasc2 etc:1}  {Document your independent review of radiology images, and any outside records:1} {Document your discussion with family members, caretakers, and with consultants:1} {Document social determinants of health affecting pt's care:1} {Document your decision making why or why not admission, treatments  were needed:1} Final Clinical Impression(s) / ED Diagnoses Final diagnoses:  None    Rx / DC Orders ED Discharge Orders     None

## 2023-07-27 LAB — CULTURE, BLOOD (ROUTINE X 2)
Culture: NO GROWTH
Culture: NO GROWTH
Special Requests: ADEQUATE
Special Requests: ADEQUATE

## 2023-08-05 ENCOUNTER — Telehealth: Payer: Self-pay

## 2023-08-05 ENCOUNTER — Other Ambulatory Visit: Payer: Medicare HMO

## 2023-08-05 ENCOUNTER — Ambulatory Visit
Admission: RE | Admit: 2023-08-05 | Discharge: 2023-08-05 | Disposition: A | Discharge status: Home / Self Care | Payer: Medicare HMO | Source: Ambulatory Visit | Attending: Hematology and Oncology

## 2023-08-05 DIAGNOSIS — Z1231 Encounter for screening mammogram for malignant neoplasm of breast: Secondary | ICD-10-CM | POA: Diagnosis not present

## 2023-08-05 NOTE — Telephone Encounter (Signed)
 Transition Care Management Unsuccessful Follow-up Telephone Call  Date of discharge and from where:  [facility] 11/1  Attempts:  1st Attempt  Reason for unsuccessful TCM follow-up call:  No answer/busy   [facility]  [facility], [facility] Guide, Phone: [phone] Website: [email or web address]

## 2023-08-08 ENCOUNTER — Other Ambulatory Visit (HOSPITAL_BASED_OUTPATIENT_CLINIC_OR_DEPARTMENT_OTHER): Payer: Self-pay | Admitting: Cardiovascular Disease

## 2023-08-08 ENCOUNTER — Other Ambulatory Visit (HOSPITAL_BASED_OUTPATIENT_CLINIC_OR_DEPARTMENT_OTHER): Payer: Self-pay | Admitting: Family

## 2023-08-08 ENCOUNTER — Telehealth: Payer: Self-pay

## 2023-08-08 DIAGNOSIS — I10 Essential (primary) hypertension: Secondary | ICD-10-CM

## 2023-08-08 NOTE — Telephone Encounter (Signed)
Transition Care Management Unsuccessful Follow-up Telephone Call  Date of discharge and from where:  [facility] 11/18  Attempts:  2nd Attempt  Reason for unsuccessful TCM follow-up call:  No answer/busy   [name]  [facility], [facility] Guide, Phone: [phone] Website: [email or web address]

## 2023-08-10 ENCOUNTER — Ambulatory Visit: Payer: Medicare HMO | Admitting: Internal Medicine

## 2023-08-12 ENCOUNTER — Encounter (HOSPITAL_BASED_OUTPATIENT_CLINIC_OR_DEPARTMENT_OTHER): Payer: Self-pay | Admitting: Family

## 2023-08-12 ENCOUNTER — Ambulatory Visit (HOSPITAL_BASED_OUTPATIENT_CLINIC_OR_DEPARTMENT_OTHER): Payer: Medicare HMO | Admitting: Family

## 2023-08-12 VITALS — BP 124/78 | HR 67 | Ht 67.0 in | Wt 183.3 lb

## 2023-08-12 DIAGNOSIS — I10 Essential (primary) hypertension: Secondary | ICD-10-CM | POA: Diagnosis not present

## 2023-08-12 DIAGNOSIS — R002 Palpitations: Secondary | ICD-10-CM

## 2023-08-12 DIAGNOSIS — I700 Atherosclerosis of aorta: Secondary | ICD-10-CM | POA: Diagnosis not present

## 2023-08-12 DIAGNOSIS — E782 Mixed hyperlipidemia: Secondary | ICD-10-CM | POA: Diagnosis not present

## 2023-08-12 DIAGNOSIS — Z8249 Family history of ischemic heart disease and other diseases of the circulatory system: Secondary | ICD-10-CM

## 2023-08-12 NOTE — Progress Notes (Signed)
Cardiology Office Note:  .   Date:  08/12/2023  ID:  [name], [name] 1951-03-12, MRN [id number] PCP: Lula Olszewski, MD  [facility] Providers Cardiologist:  Chilton Si, MD    History of Present Illness: .   [name] is a 72 y.o. female with a hx of SLE, HTN, prior breast cancer, PVC, PAC, aortic atherosclerosis.   Initial evaluation 06/2018 for shortness of breath and  palpitations. Monitor 06/2018 occasional PVC and PAC. Repeat monitor 01/2021 with PACs and PVCs. Patient triggered episodes associated with NSR.    Seen 01/11/22. She had a goal to be off some BP medications but as BP remained above goal HCTZ, Losartan, Metoprolol were continued. At visit 04/12/22 home BP cuff found to be accurate with home readings 110s/70s and present regimen continued. At last visit 07/2022 Metoprolol adjusted to 50mg  daily.   Last seen 05/10/23. Due to family history and  generalized activity intolerance (she was uncertain if related to recent stressor or alternate etiology) a calcium score was ordered and performed with calcium score of 0.   Presents today for follow up. Reviewed calcium score, reassured by result. More recent lupus flares due to weather changes for which she has seen rheumatology. Reports no shortness of breath nor dyspnea on exertion. Reports no chest pain, pressure, or tightness. No edema, orthopnea, PND. Reports no palpitations.    ROS: Please see the history of present illness.    All other systems reviewed and are negative.   Studies Reviewed: .        Cardiac Studies & Procedures       ECHOCARDIOGRAM  ECHOCARDIOGRAM COMPLETE 07/20/2018  Narrative *[facility]* [address] [place], [place] [address] [phone]  ------------------------------------------------------------------- Transthoracic Echocardiography  Patient:    Jaclyn, Nguyen MR #:       [id number] Study Date: 07/20/2018 Gender:     F Age:        41 Height:      167.6 cm Weight:     97.1 kg BSA:        2.16 m^2 Pt. Status: Room:  ATTENDING    Armanda Magic, MD SONOGRAPHER  Cathie Beams PERFORMING   [facility], Outpatient ORDERING     Chilton Si, MD REFERRING    Chilton Si, MD  cc:  ------------------------------------------------------------------- LV EF: 55% -   60%  ------------------------------------------------------------------- Indications:      R00.2 Palpitations. R06.02 Shortness of breath.  ------------------------------------------------------------------- History:   PMH:  Edema. Leg pain. Murmur. Lupus. Breast cancer. Risk factors:  Hypertension.  ------------------------------------------------------------------- Study Conclusions  - Left ventricle: The cavity size was normal. There was severe focal basal and mild concentric hypertrophy. Systolic function was normal. The estimated ejection fraction was in the range of 55% to 60%. Wall motion was normal; there were no regional wall motion abnormalities. There was an increased relative contribution of atrial contraction to ventricular filling. Doppler parameters are consistent with abnormal left ventricular relaxation (grade 1 diastolic dysfunction). - Aortic valve: Trileaflet; normal thickness, mildly calcified leaflets. There was mild regurgitation. - Mitral valve: There was trivial regurgitation. Valve area by pressure half-time: 2.06 cm^2. - Tricuspid valve: There was trivial regurgitation.  ------------------------------------------------------------------- Study data:  Comparison was made to the study of 10/06/2014.  Study status:  Routine.  Procedure:  The patient reported no pain pre or post test. Transthoracic echocardiography. Image quality was adequate.  Study completion:  There were no complications. Transthoracic echocardiography.  M-mode, complete 2D, spectral  Doppler, and color Doppler.  Birthdate:  Patient birthdate: 12-22-1950.  Age:   Patient is 72 yr old.  Sex:  Gender: female. BMI: 34.6 kg/m^2.  Blood pressure:     136/85  Patient status: Outpatient.  Study date:  Study date: 07/20/2018. Study time: 09:29 AM.  Location:  [facility]  -------------------------------------------------------------------  ------------------------------------------------------------------- Left ventricle:  The cavity size was normal. There was severe focal basal and mild concentric hypertrophy. Systolic function was normal. The estimated ejection fraction was in the range of 55% to 60%. Wall motion was normal; there were no regional wall motion abnormalities. There was an increased relative contribution of atrial contraction to ventricular filling. Doppler parameters are consistent with abnormal left ventricular relaxation (grade 1 diastolic dysfunction).  ------------------------------------------------------------------- Aortic valve:   Trileaflet; normal thickness, mildly calcified leaflets. Mobility was not restricted.  Doppler:  Transvalvular velocity was within the normal range. There was no stenosis. There was mild regurgitation.  ------------------------------------------------------------------- Aorta:  Aortic root: The aortic root was normal in size.  ------------------------------------------------------------------- Mitral valve:   Structurally normal valve.   Mobility was not restricted.  Doppler:  Transvalvular velocity was within the normal range. There was no evidence for stenosis. There was trivial regurgitation.    Valve area by pressure half-time: 2.06 cm^2. Indexed valve area by pressure half-time: 0.95 cm^2/m^2.  ------------------------------------------------------------------- Left atrium:  The atrium was normal in size.  ------------------------------------------------------------------- Right ventricle:  The cavity size was normal. Wall thickness was normal. Systolic function was  normal.  ------------------------------------------------------------------- Pulmonic valve:    Structurally normal valve.   Cusp separation was normal.  Doppler:  Transvalvular velocity was within the normal range. There was no evidence for stenosis. There was no regurgitation.  ------------------------------------------------------------------- Tricuspid valve:   Structurally normal valve.    Doppler: Transvalvular velocity was within the normal range. There was trivial regurgitation.  ------------------------------------------------------------------- Pulmonary artery:   The main pulmonary artery was normal-sized. Systolic pressure was within the normal range.  ------------------------------------------------------------------- Right atrium:  The atrium was normal in size.  ------------------------------------------------------------------- Pericardium:  There was no pericardial effusion.  ------------------------------------------------------------------- Systemic veins: Inferior vena cava: The vessel was normal in size.  ------------------------------------------------------------------- Measurements  Left ventricle                           Value          Reference LV ID, ED, PLAX chordal          (L)     33    mm       43 - 52 LV ID, ES, PLAX chordal          (L)     22    mm       23 - 38 LV fx shortening, PLAX chordal           33    %        >=29 LV PW thickness, ED                      12    mm       ---------- IVS/LV PW ratio, ED                      1.25           <=1.3 LV e&', lateral  5.87  cm/s     ---------- LV E/e&', lateral                         9.27           ---------- LV e&', medial                            4.13  cm/s     ---------- LV E/e&', medial                          13.17          ---------- LV e&', average                           5     cm/s     ---------- LV E/e&', average                         10.88           ----------  Ventricular septum                       Value          Reference IVS thickness, ED                        15    mm       ----------  LVOT                                     Value          Reference LVOT ID, S                               20    mm       ---------- LVOT area                                3.14  cm^2     ----------  Aortic valve                             Value          Reference Aortic regurg peak velocity              365   cm/s     ---------- Aortic regurg pressure half-time         837   ms       ---------- Aortic regurg peak gradient              53    mm Hg    ----------  Aorta                                    Value          Reference Aortic root ID, ED  33    mm       ----------  Left atrium                              Value          Reference LA ID, A-P, ES                           30    mm       ---------- LA ID/bsa, A-P                           1.39  cm/m^2   <=2.2 LA volume, S                             59.8  ml       ---------- LA volume/bsa, S                         27.6  ml/m^2   ---------- LA volume, ES, 1-p A4C                   53    ml       ---------- LA volume/bsa, ES, 1-p A4C               24.5  ml/m^2   ---------- LA volume, ES, 1-p A2C                   65.9  ml       ---------- LA volume/bsa, ES, 1-p A2C               30.4  ml/m^2   ----------  Mitral valve                             Value          Reference Mitral E-wave peak velocity              54.4  cm/s     ---------- Mitral A-wave peak velocity              92.5  cm/s     ---------- Mitral deceleration time         (H)     366   ms       150 - 230 Mitral E/A ratio, peak                   0.6            ---------- Mitral valve area, PHT, DP               2.06  cm^2     ---------- Mitral valve area/bsa, PHT, DP           0.95  cm^2/m^2 ----------  Pulmonary arteries                       Value          Reference PA pressure, S, DP                        20    mm Hg    <=30  Tricuspid valve                          Value          Reference Tricuspid regurg peak velocity           209   cm/s     ---------- Tricuspid peak RV-RA gradient            17    mm Hg    ----------  Right atrium                             Value          Reference RA ID, S-I, ES, A4C              (H)     53    mm       34 - 49 RA area, ES, A4C                         11.4  cm^2     8.3 - 19.5 RA volume, ES, A/L                       20.7  ml       ---------- RA volume/bsa, ES, A/L                   9.6   ml/m^2   ----------  Systemic veins                           Value          Reference Estimated CVP                            3     mm Hg    ----------  Right ventricle                          Value          Reference RV pressure, S, DP                       20    mm Hg    <=30 RV s&', lateral, S                        11.6  cm/s     ----------  Pulmonic valve                           Value          Reference Pulmonic regurg velocity, ED             102   cm/s     ----------  Legend: (L)  and  (H)  mark values outside specified reference range.  ------------------------------------------------------------------- Prepared and Electronically Authenticated by  Armanda Magic, MD 2019-10-31T11:14:52    MONITORS  CARDIAC EVENT MONITOR 02/17/2021  Narrative  Patient had a minimum heart rate of 52 bpm, maximum heart rate of 141 bpm, and average heart rate of 76 bpm.  Predominant underlying rhythm was sinus rhythm.  One run of non-sustained ventricular tachycardia occurred lasting 6  beats at longest with a max rate of 131 bpm at fastest.  Isolated PACs were rare (<1.0%).  Isolated PVCs were occasional (1.0%).  No evidence of complete heart block or atrial fibrillation.  Triggered and diary events associated with sinus rhythm.  No malignant arrhythmias.   CT SCANS  CT CARDIAC SCORING (SELF PAY ONLY) 06/02/2023  Addendum 06/17/2023 11:20  PM ADDENDUM REPORT: 06/17/2023 23:18  EXAM: OVER-READ INTERPRETATION  CT CHEST  The following report is an over-read performed by radiologist Dr. Alcide Clever of [facility] on 06/17/2023. This over-read does not include interpretation of cardiac or coronary anatomy or pathology. The coronary calcium score interpretation by the cardiologist is attached.  COMPARISON:  None.  FINDINGS: Cardiovascular: There are no significant extracardiac vascular findings.  Mediastinum/Nodes: There are no enlarged lymph nodes within the visualized mediastinum.  Lungs/Pleura: There is no pleural effusion. The visualized lungs appear clear.  Upper abdomen: No significant findings in the visualized upper abdomen.  Musculoskeletal/Chest wall: No chest wall mass or suspicious osseous findings within the visualized chest.  IMPRESSION: No significant extracardiac findings within the visualized chest.   Electronically Signed By: Alcide Clever M.D. On: 06/17/2023 23:18  Narrative CLINICAL DATA:  Cardiovascular Disease Risk stratification  EXAM: Coronary Calcium Score  TECHNIQUE: A gated, non-contrast computed tomography scan of the heart was performed using 3mm slice thickness. Axial images were analyzed on a dedicated workstation. Calcium scoring of the coronary arteries was performed using the Agatston method.  MEDICATIONS: MEDICATIONS None  FINDINGS: Coronary arteries: Normal origins.  Coronary Calcium Score:  Left main: 0  Left anterior descending artery: 0  Left circumflex artery: 0  Right coronary artery: 0  Total: 0  Pericardium: Normal.  Ascending Aorta: Normal caliber.  Non-cardiac: See separate report from [facility].  IMPRESSION: Coronary calcium score of 0 Agatston units. This suggests low risk for future cardiac events.  RECOMMENDATIONS: Coronary artery calcium (CAC) score is a strong predictor of incident coronary heart disease  (CHD) and provides predictive information beyond traditional risk factors. CAC scoring is reasonable to use in the decision to withhold, postpone, or initiate statin therapy in intermediate-risk or selected borderline-risk asymptomatic adults (age 63-75 years and LDL-C >=70 to <190 mg/dL) who do not have diabetes or established atherosclerotic cardiovascular disease (ASCVD).* In intermediate-risk (10-year ASCVD risk >=7.5% to <20%) adults or selected borderline-risk (10-year ASCVD risk >=5% to <7.5%) adults in whom a CAC score is measured for the purpose of making a treatment decision the following recommendations have been made:  If CAC=0, it is reasonable to withhold statin therapy and reassess in 5 to 10 years, as long as higher risk conditions are absent (diabetes mellitus, family history of premature CHD in first degree relatives (males <55 years; females <65 years), cigarette smoking, or LDL >=190 mg/dL).  If CAC is 1 to 99, it is reasonable to initiate statin therapy for patients >=5 years of age.  If CAC is >=100 or >=75th percentile, it is reasonable to initiate statin therapy at any age.  Cardiology referral should be considered for patients with CAC scores >=400 or >=75th percentile.  *2018 AHA/ACC/AACVPR/AAPA/ABC/ACPM/ADA/AGS/APhA/ASPC/NLA/PCNA Guideline on the Management of Blood Cholesterol: A Report of the American College of Cardiology/American Heart Association Task Force on Clinical Practice Guidelines. J Am Coll Cardiol. 2019;73(24):3168-3209.  Marca Ancona, MD  Electronically Signed: By: Marca Ancona M.D. On: 06/02/2023 16:53           Risk Assessment/Calculations:  Physical Exam:   VS:  BP 132/62   Pulse 67   Ht 5\' 7"  (1.702 m)   Wt 183 lb 4.8 oz (83.1 kg)   SpO2 98%   BMI 28.71 kg/m    Wt Readings from Last 3 Encounters:  08/12/23 183 lb 4.8 oz (83.1 kg)  07/22/23 175 lb (79.4 kg)  06/30/23 184 lb 3.2 oz (83.6 kg)    GEN:  Well nourished, well developed in no acute distress NECK: No JVD; No carotid bruits CARDIAC: RRR, no murmurs, rubs, gallops RESPIRATORY:  Clear to auscultation without rales, wheezing or rhonchi  ABDOMEN: Soft, non-tender, non-distended EXTREMITIES:  No edema; No deformity   ASSESSMENT AND PLAN: .    HTN -BP controlled.   Continue present antihypertensive regimen hydrochlorothiazide 25 mg daily, losartan 25 mg daily, Toprol 50 mg daily.  Discussed to monitor BP at home at least 2 hours after medications and sitting for 5-10 minutes.    Obesity - Weight loss via diet and exercise encouraged. Discussed the impact being overweight would have on cardiovascular risk.    LE edema -well-controlled on hydrochlorothiazide 25 mg daily.  Aortic atherosclerosis / Family history of cardiovascular disease- Calcium score 05/2023 of 0.  Recommend lipid panel with next fasting labs with PCP to obtain LDL goal <70. Has previously declined statin. Repatha or Leqvio could be considered based on labs.   Palpitations / PVC / PAC - Continue present dose Toprol.    Dispo: follow up in 6 months with Dr. Duke Salvia  Signed, Alver Sorrow, NP

## 2023-08-12 NOTE — Patient Instructions (Signed)
Medication Instructions:  Your physician recommends that you continue on your current medications as directed. Please refer to the Current Medication list given to you today.   *If you need a refill on your cardiac medications before your next appointment, please call your pharmacy*  Lab Work: NONE   Testing/Procedures: NONE  Follow-Up: At [facility], you and your health needs are our priority.  As part of our continuing mission to provide you with exceptional heart care, we have created designated Provider Care Teams.  These Care Teams include your primary Cardiologist (physician) and Advanced Practice Providers (APPs -  Physician Assistants and Nurse Practitioners) who all work together to provide you with the care you need, when you need it.  We recommend signing up for the patient portal called "MyChart".  Sign up information is provided on this After Visit Summary.  MyChart is used to connect with patients for Virtual Visits (Telemedicine).  Patients are able to view lab/test results, encounter notes, upcoming appointments, etc.  Non-urgent messages can be sent to your provider as well.   To learn more about what you can do with MyChart, go to [email or web address].    Your next appointment:   6 month(s)  Provider:   Chilton Si, MD

## 2023-08-29 DIAGNOSIS — H2512 Age-related nuclear cataract, left eye: Secondary | ICD-10-CM | POA: Diagnosis not present

## 2023-08-29 DIAGNOSIS — Z961 Presence of intraocular lens: Secondary | ICD-10-CM | POA: Diagnosis not present

## 2023-08-29 DIAGNOSIS — H25812 Combined forms of age-related cataract, left eye: Secondary | ICD-10-CM | POA: Diagnosis not present

## 2023-09-18 ENCOUNTER — Other Ambulatory Visit: Payer: Self-pay | Admitting: Internal Medicine

## 2023-09-18 DIAGNOSIS — R1084 Generalized abdominal pain: Secondary | ICD-10-CM

## 2023-09-18 DIAGNOSIS — R1010 Upper abdominal pain, unspecified: Secondary | ICD-10-CM

## 2023-09-28 ENCOUNTER — Ambulatory Visit: Payer: Medicare HMO | Admitting: Internal Medicine

## 2023-09-28 ENCOUNTER — Encounter: Payer: Self-pay | Admitting: Internal Medicine

## 2023-09-28 VITALS — BP 146/82 | HR 57 | Temp 98.0°F | Ht 67.0 in | Wt 183.0 lb

## 2023-09-28 DIAGNOSIS — F418 Other specified anxiety disorders: Secondary | ICD-10-CM

## 2023-09-28 DIAGNOSIS — I5189 Other ill-defined heart diseases: Secondary | ICD-10-CM

## 2023-09-28 DIAGNOSIS — K219 Gastro-esophageal reflux disease without esophagitis: Secondary | ICD-10-CM | POA: Diagnosis not present

## 2023-09-28 DIAGNOSIS — M19049 Primary osteoarthritis, unspecified hand: Secondary | ICD-10-CM

## 2023-09-28 DIAGNOSIS — I10 Essential (primary) hypertension: Secondary | ICD-10-CM

## 2023-09-28 MED ORDER — PEPCID COMPLETE 10-800-165 MG PO CHEW: 1.0000 | CHEWABLE_TABLET | Freq: Two times a day (BID) | ORAL | 11 refills | Status: DC | PRN

## 2023-09-28 MED ORDER — CELECOXIB 50 MG PO CAPS: 50.0000 mg | ORAL_CAPSULE | Freq: Two times a day (BID) | ORAL | 2 refills | Status: DC

## 2023-09-28 NOTE — Assessment & Plan Note (Signed)
 She exhibits multiple elevated blood pressure readings in the clinic, likely attributed to white coat syndrome. We request home blood pressure readings for accurate assessment.

## 2023-09-28 NOTE — Assessment & Plan Note (Signed)
 Gastroesophageal Reflux Disease (GERD)   Her chronic GERD is managed with Prilosec. We will continue Prilosec.

## 2023-09-28 NOTE — Patient Instructions (Signed)
 VISIT SUMMARY:  During today's visit, we discussed your worsening hand pain, which is associated with hand arthritis and has been difficult to manage with your current medications. We also reviewed your blood pressure, anxiety, kidney disease, heart condition, and GERD. 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  YOUR PLAN:  -HAND OSTEOARTHRITIS: Hand osteoarthritis is a condition where the cartilage in your hand joints wears down, causing pain and stiffness. We will order x-rays of both hands and prescribe Celebrex  50 mg along with Pepcid  Complete to protect your stomach. You should continue using Voltaren gel as well.  -HYPERTENSION: Hypertension, or high blood pressure, can be influenced by stress and anxiety, such as white coat syndrome. We recommend you monitor your blood pressure at home to get more accurate readings.  -ANXIETY: Anxiety is a condition that causes excessive worry and stress. You will continue taking buspirone  to manage your anxiety.  -STAGE 2 CHRONIC KIDNEY DISEASE: Stage 2 chronic kidney disease means your kidneys are not functioning at full capacity but are still managing well. We will continue with your current management plan.  -DIASTOLIC DYSFUNCTION: Diastolic dysfunction is a heart condition where the heart has difficulty relaxing and filling with blood. We will continue with your current management plan.  -GASTROESOPHAGEAL REFLUX DISEASE (GERD): GERD is a condition where stomach acid frequently flows back into the tube connecting your mouth and stomach, causing discomfort. You will continue taking Prilosec to manage this condition.  -GENERAL HEALTH MAINTENANCE: You are up to date on your mammograms. We discussed the benefits of a low-carb diet for reducing cancer recurrence and improving heart health. Please continue with your low-carb diet and annual  mammograms.  -GOALS OF CARE: We discussed the potential for nursing home placement for the individual you care for due to their worsening condition and aggressive behavior. We will support you in the legal process and provide necessary medical documentation.  INSTRUCTIONS:  Please follow up with the orthopedic doctor after your x-ray results are available. Additionally, discuss legal and placement options for your relative with an elder law attorney.

## 2023-09-28 NOTE — Assessment & Plan Note (Signed)
 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 Previous treatments include Voltaren and Tylenol , with orthopedic and rheumatology consultations noting spur formation. Despite the risk of stomach bleeding associated with Celebrex , she prefers it, acknowledging the need for Pepcid  Complete for stomach protection. We will order bilateral hand x-rays, prescribe Celebrex  50 mg alongside Pepcid  Complete, and continue Voltaren gel.

## 2023-09-28 NOTE — Progress Notes (Signed)
 ==============================  [facility]: [phone]   -- Medical Office Visit --  Patient: [name]      Age: 73 y.o.       Sex:  female  Date:   09/28/2023 Today's Healthcare Provider: Bernardino KANDICE Cone, MD  ==============================   CHIEF COMPLAINT: 6 month follow-up and Arthritis (Bilateral hands. No relief with orthopedic/rheum plan yet.  9/10 pain)     SUBJECTIVE: 73 y.o. female who has Malignant neoplasm of upper-outer quadrant of right breast in female, estrogen receptor positive (HCC); HTN (hypertension); Hiatal hernia; DIVERTICULOSIS, COLON; Lupus; Pain in joint; Personal history of colon cancer; History of breast cancer; Family history of coronary artery disease in father; Hyperlipidemia with target LDL less than 130; LVH (left ventricular hypertrophy) due to hypertensive disease, without heart failure; Thiamine deficiency neuropathy; Weight disorder; Lower extremity edema; Primary localized osteoarthrosis of multiple sites; Rheumatoid arthritis (HCC); GERD (gastroesophageal reflux disease); Prediabetes; Difficulty standing; Hand arthritis; Aortic atherosclerosis (HCC); Diverticular disease; Upper abdominal pain; Chronic idiopathic constipation; DDD (degenerative disc disease), thoracic; Elevated amylase; Abnormal finding on imaging of liver; Intractable abdominal pain; Gastric wall thickening; Chronic kidney disease, stage 2 (mild); Diastolic dysfunction; NSVT (nonsustained ventricular tachycardia) (HCC); Adjustment disorder; Chronic pain of left knee; and Other specified anxiety disorders on their problem list.  History of Present Illness The patient, with a history of anxiety, atherosclerosis, constipation, stage two kidney disease, and diastolic dysfunction, presents with worsening hand pain, which she rates as a 9 out of 10. The pain is associated with hand arthritis, and the patient reports progression since her last x-ray.  She has previously seen orthopedics and rheumatology for this issue. The orthopedic doctor recommended Voltaren three times a day and Tylenol , but the patient reports these measures have not been sufficient to manage the pain. The patient also mentions the formation of a spur, as diagnosed by the rheumatologist, and notes that the pain has decreased since the spur formed, but the hands still hurt, especially in the cold.  The patient also has a history of breast cancer, which is long gone, and she is up-to-date on her mammograms. She has been managing her anxiety with buspirone  and is currently on a low-carb diet. She has been experiencing white coat syndrome, resulting in high blood pressure readings in the clinic, but she plans to monitor her blood pressure at home.  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 This has been causing additional stress and the patient is considering nursing home placement for this individual.  Note that patient  has a past medical history of Acute pain of right shoulder (11/01/2022), Aortic atherosclerosis (HCC) (11/01/2022), Arthritis, Back pain, Blood transfusion, Breast cancer (HCC) (2009), Cataract, Chicken pox, Colon cancer (HCC) (2003), Depression, Difficulty standing (09/08/2022), Diverticular disease (11/01/2022), Diverticulitis, Edema of both ankles, Family history of breast cancer (10/24/2017), Flank pain (11/03/2007), Gallbladder problem, Gastritis, Generalized abdominal pain (12/15/2022), GERD (gastroesophageal reflux disease), Hepatitis, Hiatal hernia, History of stomach ulcers, Hypertension, Insomnia (02/17/2021), Joint pain, Lactose intolerance, Lower extremity edema (01/11/2022), Lupus, Multiple gastric ulcers, Palpitations, Personal history of radiation therapy (2009), Prediabetes (09/08/2022), Rheumatic fever, Rheumatoid arthritis (HCC), SLE (systemic lupus erythematosus related syndrome)  (HCC), Urinary incontinence, and UTI (urinary tract infection).  Past Medical History - Anxiety - Atherosclerosis - Constipation - Stage 2 kidney disease - Diastolic dysfunction - Gastric wall thickening - GERD - Hand arthritis - Breast cancer  Problem list overviews  that were updated at today's visit: Problem  Insomnia (Resolved)   Flared with seeing sister summer 2024 Difficulty falling asleep and waking up She snores but not aware of apnea-never had sleep study She hasn't tried medication(s) Getting about 4 hours nightly No caffeine relationship Doesn't seem related with arthritis Is associated with racing thoughts. Music and bible helps.       Med reconciliation: Current Outpatient Medications on File Prior to Visit  Medication Sig  . busPIRone  (BUSPAR ) 10 MG tablet Take 1 tablet (10 mg total) by mouth 2 (two) times daily.  . hydrochlorothiazide  (HYDRODIURIL ) 25 MG tablet TAKE 1 TABLET (25 MG TOTAL) BY MOUTH DAILY.  . hydroxychloroquine (PLAQUENIL) 200 MG tablet Take 200 mg by mouth 2 (two) times daily.  . losartan  (COZAAR ) 25 MG tablet TAKE 1 TABLET (25 MG TOTAL) BY MOUTH DAILY.  . metoprolol  succinate (TOPROL -XL) 50 MG 24 hr tablet Take 1 tablet (50 mg total) by mouth daily.  . Multiple Vitamins-Minerals (EMERGEN-C IMMUNE PLUS PO) Take 1 tablet by mouth daily.  . omeprazole  (PRILOSEC) 40 MG capsule TAKE 1 CAPSULE DAILY   No current facility-administered medications on file prior to visit.  There are no discontinued medications.    Objective   Physical Exam     09/28/2023   12:04 PM 09/28/2023   11:35 AM 09/28/2023   11:28 AM  Vitals with BMI  Height   5' 7  Weight   183 lbs  BMI   28.66  Systolic 146 169 831  Diastolic 82 79 91  Pulse   57   Wt Readings from Last 10 Encounters:  09/28/23 183 lb (83 kg)  08/12/23 183 lb 4.8 oz (83.1 kg)  07/22/23 175 lb (79.4 kg)  06/30/23 184 lb 3.2 oz (83.6 kg)  06/07/23 183 lb 6 oz (83.2 kg)  05/10/23 184 lb (83.5  kg)  05/05/23 150 lb (68 kg)  02/07/23 180 lb 6.4 oz (81.8 kg)  01/20/23 185 lb (83.9 kg)  01/12/23 185 lb (83.9 kg)   Vital signs reviewed.  Nursing notes reviewed. Weight trend reviewed. Abnormalities and Problem-Specific physical exam findings:  hand stiffness but full range of motion   General Appearance:  No acute distress appreciable.   Well-groomed, healthy-appearing female.  Well proportioned with no abnormal fat distribution.  Good muscle tone. Pulmonary:  Normal work of breathing at rest, no respiratory distress apparent. SpO2: 99 %  Musculoskeletal: All extremities are intact.  Neurological:  Awake, alert, oriented, and engaged.  No obvious focal neurological deficits or cognitive impairments.  Sensorium seems unclouded.   Speech is clear and coherent with logical content. Psychiatric:  Appropriate mood, pleasant and cooperative demeanor, thoughtful and engaged during the exam    No results found for any visits on 09/28/23. Admission on 07/22/2023, Discharged on 07/22/2023  Component Date Value  . SARS Coronavirus 2 by RT* 07/22/2023 NEGATIVE   . Influenza A by PCR 07/22/2023 NEGATIVE   . Influenza B by PCR 07/22/2023 NEGATIVE   . Resp Syncytial Virus by * 07/22/2023 NEGATIVE   . Group A Strep by PCR 07/22/2023 NOT DETECTED   . Lactic Acid, Venous 07/22/2023 0.9   . Sodium 07/22/2023 138   . Potassium 07/22/2023 3.5   . Chloride 07/22/2023 105   . CO2 07/22/2023 26   . Glucose, Bld 07/22/2023 93   . BUN 07/22/2023 13   . Creatinine, Ser 07/22/2023 0.66   . Calcium  07/22/2023 10.0   . Total Protein 07/22/2023 7.0   .  Albumin  07/22/2023 3.9   . AST 07/22/2023 21   . ALT 07/22/2023 13   . Alkaline Phosphatase 07/22/2023 77   . Total Bilirubin 07/22/2023 0.5   . GFR, Estimated 07/22/2023 >60   . Anion gap 07/22/2023 7   . WBC 07/22/2023 4.4   . RBC 07/22/2023 4.03   . Hemoglobin 07/22/2023 12.4   . HCT 07/22/2023 37.0   . MCV 07/22/2023 91.8   . [facility] 07/22/2023  30.8   . MCHC 07/22/2023 33.5   . RDW 07/22/2023 12.5   . Platelets 07/22/2023 182   . nRBC 07/22/2023 0.0   . Neutrophils Relative % 07/22/2023 56   . Neutro Abs 07/22/2023 2.5   . Lymphocytes Relative 07/22/2023 26   . Lymphs Abs 07/22/2023 1.2   . Monocytes Relative 07/22/2023 15   . Monocytes Absolute 07/22/2023 0.7   . Eosinophils Relative 07/22/2023 3   . Eosinophils Absolute 07/22/2023 0.1   . Basophils Relative 07/22/2023 0   . Basophils Absolute 07/22/2023 0.0   . Immature Granulocytes 07/22/2023 0   . Abs Immature Granulocytes 07/22/2023 0.01   . Prothrombin Time 07/22/2023 13.5   . INR 07/22/2023 1.0   . aPTT 07/22/2023 27   . Specimen Description 07/22/2023                     Value:BLOOD LEFT ANTECUBITAL Performed at [facility], [address] [place], [place] [address]   . Special Requests 07/22/2023                     Value:BOTTLES DRAWN AEROBIC AND ANAEROBIC Blood Culture adequate volume Performed at [facility], [address] [place], [place] [address]   . Culture 07/22/2023                     Value:NO GROWTH 5 DAYS Performed at [facility], [address] [place], [place] [address]   . Report Status 07/22/2023 07/27/2023 FINAL   . Specimen Description 07/22/2023                     Value:BLOOD BLOOD LEFT ARM Performed at [facility], [address] [place], [place] [address]   . Special Requests 07/22/2023                     Value:BOTTLES DRAWN AEROBIC AND ANAEROBIC Blood Culture adequate volume Performed at [facility], [address] [place], [place] [address]   . Culture 07/22/2023                     Value:NO GROWTH 5 DAYS Performed at [facility], [address], [place], [place] [address]   . Report Status 07/22/2023 07/27/2023 FINAL   . Specimen Source 07/22/2023 URINE, CLEAN CATCH   . Color, Urine 07/22/2023 YELLOW   . APPearance  07/22/2023 CLEAR   . Specific Gravity, Urine 07/22/2023 1.012   . pH 07/22/2023 7.0   . Glucose, UA 07/22/2023 NEGATIVE   . Hgb urine dipstick 07/22/2023 SMALL (A)   . Bilirubin Urine 07/22/2023 NEGATIVE   . Ketones, ur 07/22/2023 NEGATIVE   . Protein, ur 07/22/2023 NEGATIVE   . Nitrite 07/22/2023 NEGATIVE   . Leukocytes,Ua 07/22/2023 NEGATIVE   . RBC / HPF 07/22/2023 6-10   . WBC, UA 07/22/2023 0-5   . Bacteria, UA 07/22/2023 NONE SEEN   . Squamous Epithelial /  HPF 07/22/2023 0-5   . Mucus 07/22/2023 PRESENT   Appointment on 06/30/2023  Component Date Value  . Sodium 06/30/2023 140   . Potassium 06/30/2023 3.7   . Chloride 06/30/2023 107   . CO2 06/30/2023 28   . Glucose, Bld 06/30/2023 83   . BUN 06/30/2023 10   . Creatinine 06/30/2023 0.75   . Calcium  06/30/2023 9.6   . Total Protein 06/30/2023 7.0   . Albumin  06/30/2023 3.9   . AST 06/30/2023 20   . ALT 06/30/2023 12   . Alkaline Phosphatase 06/30/2023 80   . Total Bilirubin 06/30/2023 0.5   . GFR, Estimated 06/30/2023 >60   . Anion gap 06/30/2023 5   . WBC 06/30/2023 3.2 (L)   . RBC 06/30/2023 3.86 (L)   . Hemoglobin 06/30/2023 12.3   . HCT 06/30/2023 36.4   . MCV 06/30/2023 94.3   . MCH 06/30/2023 31.9   . MCHC 06/30/2023 33.8   . RDW 06/30/2023 12.4   . Platelets 06/30/2023 160   . nRBC 06/30/2023 0.0   . Neutrophils Relative % 06/30/2023 28   . Neutro Abs 06/30/2023 0.9 (L)   . Lymphocytes Relative 06/30/2023 52   . Lymphs Abs 06/30/2023 1.7   . Monocytes Relative 06/30/2023 16   . Monocytes Absolute 06/30/2023 0.5   . Eosinophils Relative 06/30/2023 3   . Eosinophils Absolute 06/30/2023 0.1   . Basophils Relative 06/30/2023 1   . Basophils Absolute 06/30/2023 0.0   . Immature Granulocytes 06/30/2023 0   . Abs Immature Granulocytes 06/30/2023 0.00   Office Visit on 05/05/2023  Component Date Value  . TSH 05/05/2023 0.89   . Sodium 05/05/2023 136   . Potassium 05/05/2023 4.0   . Chloride 05/05/2023  102   . CO2 05/05/2023 26   . Glucose, Bld 05/05/2023 79   . BUN 05/05/2023 16   . Creatinine, Ser 05/05/2023 0.73   . Total Bilirubin 05/05/2023 0.4   . Alkaline Phosphatase 05/05/2023 88   . AST 05/05/2023 19   . ALT 05/05/2023 12   . Total Protein 05/05/2023 6.9   . Albumin  05/05/2023 4.0   . GFR 05/05/2023 81.13   . Calcium  05/05/2023 9.9   . WBC 05/05/2023 4.3   . RBC 05/05/2023 3.97   . Hemoglobin 05/05/2023 12.4   . HCT 05/05/2023 37.7   . MCV 05/05/2023 94.8   . MCHC 05/05/2023 32.9   . RDW 05/05/2023 13.7   . Platelets 05/05/2023 185.0   . Neutrophils Relative % 05/05/2023 33.8 (L)   . Lymphocytes Relative 05/05/2023 47.1 (H)   . Monocytes Relative 05/05/2023 16.1 (H)   . Eosinophils Relative 05/05/2023 2.5   . Basophils Relative 05/05/2023 0.5   . Neutro Abs 05/05/2023 1.5   . Lymphs Abs 05/05/2023 2.0   . Monocytes Absolute 05/05/2023 0.7   . Eosinophils Absolute 05/05/2023 0.1   . Basophils Absolute 05/05/2023 0.0   . Hgb A1c MFr Bld 05/05/2023 5.4   . Vitamin B-12 05/05/2023 326   . Folate 05/05/2023 16.7   . VITD 05/05/2023 33.61   Office Visit on 02/07/2023  Component Date Value  . Sodium 02/07/2023 140   . Potassium 02/07/2023 3.9   . Chloride 02/07/2023 103   . CO2 02/07/2023 28   . Glucose, Bld 02/07/2023 69 (L)   . BUN 02/07/2023 10   . Creatinine, Ser 02/07/2023 0.82   . Total Bilirubin 02/07/2023 0.4   . Alkaline Phosphatase 02/07/2023 81   .  AST 02/07/2023 22   . ALT 02/07/2023 11   . Total Protein 02/07/2023 6.7   . Albumin  02/07/2023 3.9   . GFR 02/07/2023 70.68   . Calcium  02/07/2023 9.9   . Amylase 02/07/2023 57   . Lipase 02/07/2023 14   Office Visit on 11/30/2022  Component Date Value  . H pylori Ag, Stl 12/13/2022 Negative   Scanned Document on 11/16/2022  Component Date Value  . EGFR 11/16/2022 77.0   Orders Only on 11/15/2022  Component Date Value  . Amylase 11/15/2022 125 (H)   . Lipase 11/15/2022 30   No image results  found. MM 3D SCREENING MAMMOGRAM BILATERAL BREAST Result Date: 08/09/2023 CLINICAL DATA:  Screening. EXAM: DIGITAL SCREENING BILATERAL MAMMOGRAM WITH TOMOSYNTHESIS AND CAD TECHNIQUE: Bilateral screening digital craniocaudal and mediolateral oblique mammograms were obtained. Bilateral screening digital breast tomosynthesis was performed. The images were evaluated with computer-aided detection. COMPARISON:  Previous exam(s). ACR Breast Density Category b: There are scattered areas of fibroglandular density. FINDINGS: There are no findings suspicious for malignancy. IMPRESSION: No mammographic evidence of malignancy. A result letter of this screening mammogram will be mailed directly to the patient. RECOMMENDATION: Screening mammogram in one year. (Code:SM-B-01Y) BI-RADS CATEGORY  1: Negative. Electronically Signed   By: Dobrinka  Dimitrova M.D.   On: 08/09/2023 14:23   DG Chest Port 1 View Result Date: 07/22/2023 CLINICAL DATA:  Questionable sepsis. EXAM: PORTABLE CHEST 1 VIEW COMPARISON:  Chest radiograph dated 06/04/2022. FINDINGS: Minimal left lung base atelectasis. No focal consolidation, pleural effusion, or pneumothorax. The cardiac silhouette is within normal limits. No acute osseous pathology. IMPRESSION: No active disease. Electronically Signed   By: Vanetta Chou M.D.   On: 07/22/2023 18:52  MM 3D SCREENING MAMMOGRAM BILATERAL BREAST Result Date: 08/09/2023 CLINICAL DATA:  Screening. EXAM: DIGITAL SCREENING BILATERAL MAMMOGRAM WITH TOMOSYNTHESIS AND CAD TECHNIQUE: Bilateral screening digital craniocaudal and mediolateral oblique mammograms were obtained. Bilateral screening digital breast tomosynthesis was performed. The images were evaluated with computer-aided detection. COMPARISON:  Previous exam(s). ACR Breast Density Category b: There are scattered areas of fibroglandular density. FINDINGS: There are no findings suspicious for malignancy. IMPRESSION: No mammographic evidence of malignancy. A  result letter of this screening mammogram will be mailed directly to the patient. RECOMMENDATION: Screening mammogram in one year. (Code:SM-B-01Y) BI-RADS CATEGORY  1: Negative. Electronically Signed   By: Dobrinka  Dimitrova M.D.   On: 08/09/2023 14:23       Assessment & Plan Hand arthritis Chronic hand osteoarthritis presents with worsening pain, rated at 9/10 in severity, exacerbated by cold weather. Previous treatments include Voltaren and Tylenol , with orthopedic and rheumatology consultations noting spur formation. Despite the risk of stomach bleeding associated with Celebrex , she prefers it, acknowledging the need for Pepcid  Complete for stomach protection. We will order bilateral hand x-rays, prescribe Celebrex  50 mg alongside Pepcid  Complete, and continue Voltaren gel.  Gastroesophageal reflux disease without esophagitis Gastroesophageal Reflux Disease (GERD)   Her chronic GERD is managed with Prilosec. We will continue Prilosec. Other specified anxiety disorders  Diastolic dysfunction  Hypertension, unspecified type She exhibits multiple elevated blood pressure readings in the clinic, likely attributed to white coat syndrome. We request home blood pressure readings for accurate assessment.      Orders Placed During this Encounter:   Orders Placed This Encounter  Procedures  . DG Hand Complete Right    Standing Status:   Future    Expiration Date:   03/27/2024    Reason for Exam (SYMPTOM  OR DIAGNOSIS  REQUIRED):   hand pain    Preferred imaging location?:   [facility]  . DG Hand Complete Left    Standing Status:   Future    Expiration Date:   03/27/2024    Reason for Exam (SYMPTOM  OR DIAGNOSIS REQUIRED):   hand pain    Preferred imaging location?:   [facility]   Meds ordered this encounter  Medications  . famotidine -calcium  carbonate-magnesium  hydroxide (PEPCID  COMPLETE) [phone] MG chewable tablet    Sig: Chew 1 tablet by mouth 2 (two) times daily as needed.     Dispense:  100 tablet    Refill:  11  . celecoxib  (CELEBREX ) 50 MG capsule    Sig: Take 1 capsule (50 mg total) by mouth 2 (two) times daily.    Dispense:  60 capsule    Refill:  2    General Health Maintenance   She is up to date on mammograms. We discussed the benefits of a low-carb diet for reducing cancer recurrence and improving heart health, encouraging a low-carb diet and continuation of annual mammograms.  Goals of Care   We discussed her relative's worsening condition and the potential for nursing home placement due to aggressive behavior, considering this option for safety and well-being as she holds power of attorney. We will support her in the legal process for nursing home placement and provide medical documentation for legal proceedings.  Follow-up   We will follow up with the orthopedic doctor after x-ray results and discuss legal and placement options for her relative with an elder law attorney.    This document was synthesized by artificial intelligence ([employer]) using HIPAA-compliant recording of the clinical interaction;   We discussed the use of AI scribe software for clinical note transcription with the patient, who gave verbal consent to proceed.    Additional Info: This encounter employed state-of-the-art, real-time, collaborative documentation. The patient actively reviewed and assisted in updating their electronic medical record on a shared screen, ensuring transparency and facilitating joint problem-solving for the problem list, overview, and plan. This approach promotes accurate, informed care. The treatment plan was discussed and reviewed in detail, including medication safety, potential side effects, and all patient questions. We confirmed understanding and comfort with the plan. Follow-up instructions were established, including contacting the office for any concerns, returning if symptoms worsen, persist, or new symptoms develop, and precautions for potential  emergency department visits.

## 2023-09-29 ENCOUNTER — Other Ambulatory Visit: Payer: Self-pay | Admitting: Internal Medicine

## 2023-09-29 ENCOUNTER — Encounter: Payer: Self-pay | Admitting: Internal Medicine

## 2023-09-29 DIAGNOSIS — K219 Gastro-esophageal reflux disease without esophagitis: Secondary | ICD-10-CM

## 2023-09-29 DIAGNOSIS — M19049 Primary osteoarthritis, unspecified hand: Secondary | ICD-10-CM

## 2023-09-29 NOTE — Telephone Encounter (Signed)
 Ok to dispense we discussed risks already

## 2023-10-31 ENCOUNTER — Encounter: Payer: Self-pay | Admitting: Internal Medicine

## 2023-10-31 ENCOUNTER — Ambulatory Visit: Payer: Medicare HMO | Admitting: Internal Medicine

## 2023-10-31 VITALS — BP 163/79 | HR 65 | Temp 98.2°F | Ht 67.0 in | Wt 181.2 lb

## 2023-10-31 DIAGNOSIS — I4729 Other ventricular tachycardia: Secondary | ICD-10-CM

## 2023-10-31 DIAGNOSIS — Z85038 Personal history of other malignant neoplasm of large intestine: Secondary | ICD-10-CM

## 2023-10-31 DIAGNOSIS — R7303 Prediabetes: Secondary | ICD-10-CM

## 2023-10-31 DIAGNOSIS — R932 Abnormal findings on diagnostic imaging of liver and biliary tract: Secondary | ICD-10-CM

## 2023-10-31 DIAGNOSIS — R2681 Unsteadiness on feet: Secondary | ICD-10-CM | POA: Diagnosis not present

## 2023-10-31 DIAGNOSIS — I10 Essential (primary) hypertension: Secondary | ICD-10-CM | POA: Diagnosis not present

## 2023-10-31 DIAGNOSIS — R3129 Other microscopic hematuria: Secondary | ICD-10-CM

## 2023-10-31 DIAGNOSIS — R748 Abnormal levels of other serum enzymes: Secondary | ICD-10-CM

## 2023-10-31 DIAGNOSIS — M069 Rheumatoid arthritis, unspecified: Secondary | ICD-10-CM | POA: Diagnosis not present

## 2023-10-31 DIAGNOSIS — F418 Other specified anxiety disorders: Secondary | ICD-10-CM

## 2023-10-31 DIAGNOSIS — K449 Diaphragmatic hernia without obstruction or gangrene: Secondary | ICD-10-CM

## 2023-10-31 DIAGNOSIS — E785 Hyperlipidemia, unspecified: Secondary | ICD-10-CM | POA: Diagnosis not present

## 2023-10-31 DIAGNOSIS — K3189 Other diseases of stomach and duodenum: Secondary | ICD-10-CM

## 2023-10-31 DIAGNOSIS — I700 Atherosclerosis of aorta: Secondary | ICD-10-CM | POA: Diagnosis not present

## 2023-10-31 DIAGNOSIS — R319 Hematuria, unspecified: Secondary | ICD-10-CM

## 2023-10-31 DIAGNOSIS — N182 Chronic kidney disease, stage 2 (mild): Secondary | ICD-10-CM

## 2023-10-31 MED ORDER — LOSARTAN POTASSIUM-HCTZ 50-12.5 MG PO TABS: 1.0000 | ORAL_TABLET | Freq: Every day | ORAL | 3 refills | Status: DC

## 2023-10-31 NOTE — Assessment & Plan Note (Signed)
 Blood pressure remains uncontrolled, possibly due to white coat hypertension. Adjust medication to losartan  50 mg/hydrochlorothiazide  12.5 mg for better control. Upload blood pressure readings daily for one week. Follow up in one month for in-person evaluation. Need more home blood pressure readings to make more significant changes.

## 2023-10-31 NOTE — Assessment & Plan Note (Signed)
 Atherosclerosis Mild atherosclerosis noted. Discuss potential benefits and risks of starting aspirin  and cholesterol medication with a cardiologist. Patient prefers to avoid anti-cholesterol medication due to side effects. Consider ultrasound of the neck to assess for atherosclerosis.

## 2023-10-31 NOTE — Assessment & Plan Note (Signed)
 Chronic Kidney Disease Stage 2 Stage 2 chronic kidney disease is standard for age. Avoid Celebrex  due to nephrotoxic effects, especially when dehydrated or kidneys are under strain. Use Celebrex  only if pain from lupus and rheumatoid arthritis flares up.

## 2023-10-31 NOTE — Assessment & Plan Note (Signed)
 Hiatal Hernia Previously symptomatic hiatal hernia is now controlled with Mylanta and Maalox. 12/2022 EGD and colonoscopy showed no other concerns though no biopsy taken. Continue current management with Mylanta and Maalox. No need for follow-up colonoscopy for a few years. Will resolve from problem list as presumed resolved for now after negative esophagoduodenoscopy.  Consider CT abdomen repeat in future possibly.

## 2023-10-31 NOTE — Assessment & Plan Note (Signed)
 Discussed need to limit Celebrex  due to hiatal hernia and chronic kidney disease Continue(s) with hydroxychloroquine and rheumatologist follow up

## 2023-10-31 NOTE — Assessment & Plan Note (Signed)
 Discussed importance of informing us  if gastritis symptom(s) return- may need to consider surgical repair. Discussed valsalva risks.

## 2023-10-31 NOTE — Assessment & Plan Note (Signed)
 Osteoarthritis and Rheumatoid Arthritis Left knee and thoracic spinal pain cause difficulty standing without using hands, likely due to knee and hip pain. Completed physical therapy but still requires hands to stand, which may strain wrists. Encourage techniques to reduce wrist strain when standing. Continue monitoring and managing pain as needed.

## 2023-10-31 NOTE — Assessment & Plan Note (Signed)
 Hematuria Intermittent microscopic hematuria likely from occasional kidney stones. Malignancy is less likely due to partial hysterectomy and absence of other symptoms. Obtain a urine sample for cytology and discuss findings with an oncologist during the next visit. Consider gynecological evaluation based on results.

## 2023-10-31 NOTE — Patient Instructions (Signed)
 Team Member Role and Specialty Contact Info Address Start End Comments  Iruku, Praveena, MD Consulting Physician (Hematology and Oncology) Phone: [phone] Fax: [phone] Email: [email or web address] [address] [place] [address] 09/08/2022 - took over surveillance of 2010ish breast ca thats been in long term remission   Talk to her about hematuria and breast cancer follow up     Talk to cardiology about aspirin  due to high stroke risk

## 2023-10-31 NOTE — Assessment & Plan Note (Signed)
 Updated problem overview for this problem to improve longitudinal management. No plans for follow up at this time but MRI might be considered to further characterize in future.

## 2023-10-31 NOTE — Assessment & Plan Note (Signed)
 Encouraged patient to continue(s) with cardiology follow up

## 2023-10-31 NOTE — Progress Notes (Signed)
 ==============================  [facility]: [phone]   -- Medical Office Visit --  Patient: [name]      Age: 73 y.o.       Sex:  female  Date:   10/31/2023 Today's Healthcare Provider: Anthon Kins, MD  ==============================   CHIEF COMPLAINT: 1 month follow-up  SUBJECTIVE: Background This is a 73 y.o. female who has Malignant neoplasm of upper-outer quadrant of right breast in female, estrogen receptor positive (HCC); HTN (hypertension); Hiatal hernia; Lupus; Personal history of colon cancer; History of breast cancer; Family history of coronary artery disease in father; Hyperlipidemia with target LDL less than 130; LVH (left ventricular hypertrophy) due to hypertensive disease, without heart failure; Weight disorder; Primary localized osteoarthrosis of multiple sites; Rheumatoid arthritis (HCC); GERD (gastroesophageal reflux disease); Difficulty standing; Hand arthritis; Aortic atherosclerosis (HCC); Diverticular disease; Chronic idiopathic constipation; DDD (degenerative disc disease), thoracic; Abnormal finding on imaging of liver; Chronic kidney disease, stage 2 (mild); Diastolic dysfunction; NSVT (nonsustained ventricular tachycardia) (HCC); Adjustment disorder; Chronic pain of left knee; Other specified anxiety disorders; and Other microscopic hematuria on their problem list.  History of Present Illness [name] is a 73 year old female with hypertension who presents for blood pressure management and follow-up and chronic disease management  She has been experiencing suspected white coat hypertension.  There is a history of hematuria, noted in her last blood work in November. She has no urinary tract symptoms and has a remote history of kidney stones at age 77. She has had traces of blood in her urine in two out of the last five to six samples over two years. She is not a smoker and has not experienced any symptoms  suggestive of passing stones recently.  She has a history of a hiatal hernia, which has previously flared up but is now under control with Mylanta and Maalox. Her recent EGD and colonoscopy did not reveal any other concerns, and she does not require a follow-up colonoscopy for a few years.  She has mild atherosclerosis, which was noted on a coronary CT scan. She does not currently take aspirin  or cholesterol medication due to concerns about side effects.  She experiences left knee pain and thoracic spinal pain, which affect her ability to stand without using her hands. She has completed physical therapy and reports no problems with working or standing, although she still needs to use her hands to stand up. She has a history of lupus and rheumatoid arthritis, for which she takes Celebrex  as needed, but she avoids it when dehydrated or when her kidneys are under strain.  She has a history of breast cancer from 2010 and is under surveillance for this condition. Her last oncology appointment is not currently scheduled, but she is due for an annual wellness check in May.   Reviewed chart records that patient  has a past medical history of Acute pain of right shoulder (11/01/2022), Aortic atherosclerosis (HCC) (11/01/2022), Arthritis, Back pain, Blood transfusion, Breast cancer (HCC) (2009), Cataract, Chicken pox, Colon cancer (HCC) (2003), Depression, Difficulty standing (09/08/2022), Diverticular disease (11/01/2022), Diverticulitis, Edema of both ankles, Elevated amylase (11/22/2022), Family history of breast cancer (10/24/2017), Flank pain (11/03/2007), Gallbladder problem, Gastric wall thickening (12/15/2022), Gastritis, Generalized abdominal pain (12/15/2022), GERD (gastroesophageal reflux disease), Hepatitis, Hiatal hernia, History of stomach ulcers, Hypertension, Insomnia (02/17/2021), Intractable abdominal pain (12/15/2022), Joint pain, Lactose intolerance, Lower extremity edema (01/11/2022), Lupus,  Multiple gastric ulcers, Palpitations, Personal history of radiation therapy (2009),  Prediabetes (09/08/2022), Rheumatic fever, Rheumatoid arthritis (HCC), SLE (systemic lupus erythematosus related syndrome) (HCC), Upper abdominal pain (11/01/2022), Urinary incontinence, and UTI (urinary tract infection). Discussed Past Medical History - White coat hypertension - Hiatal hernia (under control) - Blood in urine (occasional) - History of kidney stones at 73 years old - Breast cancer (2010) - Hardening of the arteries (mild) - Lupus - Rheumatoid arthritis     Problem list overviews that were updated at today's visit: Problem  Other Microscopic Hematuria  Nsvt (Nonsustained Ventricular Tachycardia) (Hcc)   Negative CT coronary calcium  of 0 05/2023 remote history 6 beats during palpation workup  Following with cardiologist   Other Specified Anxiety Disorders   Takes buspirone    Abnormal Finding On Imaging of Liver   She is planned to see gastroenterologist for this. Upper abdomen pain went away spontaneously and  12/13/22 CT abdomen: A few tiny sub-cm low attention lesions are too small to characterize, but most likely represent tiny cysts. 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 No suspicious hepatic masses are identified. Prior cholecystectomy. No evidence of biliary obstruction.     Aortic Atherosclerosis (Hcc)   CARDIOLOGY evaluate: Aortic atherosclerosis / Family history of cardiovascular disease- Calcium  score 05/2023 of 0.  Recommend lipid panel with next fasting labs with PCP to obtain LDL goal <70. Has previously declined statin. Repatha or Leqvio could be considered based on labs.  Statin myopathy history: never took, cholesterol medication(s) never smoked. Prefers to avoid medication(s)  Current Regimen: Diet and exercise   Lab Results  Component Value Date   CHOL 153 06/29/2021   CHOL 159 12/30/2020   CHOL 143  11/24/2007   Lab Results  Component Value Date   HDL 57 06/29/2021   HDL 61.30 12/30/2020   HDL 55 11/24/2007   Lab Results  Component Value Date   LDLCALC 80 06/29/2021   LDLCALC 76 12/30/2020   LDLCALC 71 11/24/2007   Lab Results  Component Value Date   TRIG 83 06/29/2021   TRIG 109.0 12/30/2020   TRIG 86 11/24/2007   Lab Results  Component Value Date   CHOLHDL 3 12/30/2020   CHOLHDL 2.6 Ratio 11/24/2007   No results found for: "LDLDIRECT"  The 10-year ASCVD risk score (Arnett DK, et al., 2019) is: 35.5%   Values used to calculate the score:     Age: 26 years     Sex: Female     Is Non-Hispanic African American: Yes     Diabetic: Yes     Tobacco smoker: No     Systolic Blood Pressure: 163 mmHg     Is BP treated: Yes     HDL Cholesterol: 57 mg/dL     Total Cholesterol: 153 mg/dL    Rheumatoid Arthritis (Hcc)   Takes celecoxib  due to hiatal hernia Also hydrochloroquine Following with rheum Also has lupus   Difficulty Standing   Due to knee and hip pain mainly-completed physical therapy but still needs hands to stand.   Hyperlipidemia With Target Ldl Less Than 130   The 10-year ASCVD risk score (Arnett DK, et al., 2019) is: 35.5%   Values used to calculate the score:     Age: 65 years     Sex: Female     Is Non-Hispanic African American: Yes     Diabetic: Yes     Tobacco smoker: No     Systolic Blood Pressure: 163 mmHg  Is BP treated: Yes     HDL Cholesterol: 57 mg/dL     Total Cholesterol: 153 mg/dL   Lab Results  Component Value Date   HGBA1C 5.4 05/05/2023      Personal History of Colon Cancer   Per 12/2022 visit with now retired gastrointestinal specialist, Plan next surveillance 12/2027 will need new gastrointestinal.   Hiatal Hernia   Moderate size on 12/2022 esophagoduodenoscopy Declined / deferred / expressed a preference to not move forward with surgery referral Stomach upset controlled with mylanta/Maalox/Pepcid .   Diverticular  Disease   On 2022 CT abd   Intractable Abdominal Pain (Resolved)   01/13/2023 interim history:   carafate , bentyl  (price ineffective), glycopyrrolate  (dry mouth) all stopped Maalox helps, would like to dc proton pump inhibitor (PPI) stomach acid reducer.   Prior history IMPRESSION: Focal nodular wall thickening at the GE junction and gastric fundus. Gastric carcinoma cannot be excluded. Consider upper endoscopy or upper GI series for further evaluation.  Colonic diverticulosis, without radiographic evidence of diverticulitis.  Impression:               - Normal esophagus.                           - Medium-sized hiatal hernia.                           - Normal duodenal bulb and second portion of the                            duodenum.                           - No specimens collected. Recommendation:           - Patient has a contact number available for                            emergencies. The signs and symptoms of potential                            delayed complications were discussed with the                            patient. Return to normal activities tomorrow.                            Written discharge instructions were provided to the                            patient.                           - Resume previous diet.                           - Follow antireflux measures.                           - Continue present medications. Asencion Blacksmith, MD 01/12/2023  Gastric Wall Thickening (Resolved)   11/2022 CT abdomen: Focal nodular wall thickening is seen at the GE junction and gastric fundus and gastric carcinoma cannot be excluded. Remainder of the stomach is unremarkable. No evidence of obstruction, inflammatory process or abnormal fluid collections.  Diverticulosis is seen mainly involving the descending and sigmoid colon, however there is no evidence of diverticulitis.  Had esophagoduodenoscopy showing moderate hiatal hernia but problem.    Elevated  Amylase (Resolved)   Amylase    Component Value Date/Time   AMYLASE 57 02/07/2023 1054     Upper Abdominal Pain (Resolved)   Radiates to the back and lower abdomen.has been present since late 2023 and we do not have a CAT scan yet for it.  X-ray abdomen showed only degenerative disc disease and chronic constipation. Has visit planned with gastrointestinal specialist 01/04/23  Has been taking bentyl  and hydrocodone  and it helps Has not been taking prescribed omeprazole  and carafate  due to pharmacist misunderstanding  11/30/2022 Office Visit Generalized abdominal pain  11/22/2022 Office Visit Contrast media allergy  11/01/2022 Office Visit Upper abdominal pain  09/08/2022 Office Visit Flank pain   At her last visit,  Patients chart review and interview were used to generate a prompt for artificial intelligence analysis ([employer] artificial intelligence) clinical decision support.  AI output was reviewed and is provided in red:   Differential Diagnosis: 1. Gastric Carcinoma: Given the patient's age, history of cancer, and CT findings of focal nodular wall thickening at the GE junction and gastric fundus, gastric carcinoma is a primary concern. The absence of typical heartburn sensations and the specific location of pain further support this possibility. 2. Portal Hepatic Venous Fistula: While usually asymptomatic, the identified portal hepatic venous fistula could contribute to abdominal discomfort through altered hepatic hemodynamics, though it is less likely to be the primary cause of her symptoms. 3. Liver Lesions: The tiny sub-centimeter low-attenuation lesions in the liver, most likely representing cysts, are unlikely to be symptomatic but warrant monitoring given the patient's cancer history. 4. Chronic Conditions Exacerbation: The patient's extensive medical history, including lupus (SLE), could contribute to her symptoms through mechanisms like serositis or lupus enteritis, though the  current presentation and imaging findings lean more towards a localized gastric issue.   Prediabetes (Resolved)   Lab Results  Component Value Date   HGBA1C 5.4 05/05/2023      Lower Extremity Edema (Resolved)    Today's Verbally Confirmed Medications - somewhat agreeable to switch to Losartan /Hydrochlorothiazide  50/12.5 mg - Mylanta - Maalox - Celebrex  (as needed) Current Outpatient Medications on File Prior to Visit  Medication Sig   busPIRone  (BUSPAR ) 10 MG tablet Take 1 tablet (10 mg total) by mouth 2 (two) times daily.   celecoxib  (CELEBREX ) 50 MG capsule TAKE 1 CAPSULE TWICE DAILY.   famotidine -calcium  carbonate-magnesium  hydroxide (PEPCID  COMPLETE) 10-800-165 MG chewable tablet CHEW 1 TABLET 2 TIMES DAILYAS NEEDED.   hydrochlorothiazide  (HYDRODIURIL ) 25 MG tablet TAKE 1 TABLET (25 MG TOTAL) BY MOUTH DAILY.   hydroxychloroquine (PLAQUENIL) 200 MG tablet Take 200 mg by mouth 2 (two) times daily.   losartan  (COZAAR ) 25 MG tablet TAKE 1 TABLET (25 MG TOTAL) BY MOUTH DAILY.   metoprolol  succinate (TOPROL -XL) 50 MG 24 hr tablet Take 1 tablet (50 mg total) by mouth daily.   Multiple Vitamins-Minerals (EMERGEN-C IMMUNE PLUS PO) Take 1 tablet by mouth daily.   Omeprazole  Magnesium  (PRILOSEC OTC PO) Take by mouth.   No current facility-administered medications on file prior to visit.   Medications  Discontinued During This Encounter  Medication Reason   omeprazole  (PRILOSEC) 40 MG capsule       Objective   Physical Exam     10/31/2023   11:19 AM 10/31/2023   10:35 AM 09/28/2023   12:04 PM  Vitals with BMI  Height  5\' 7"    Weight  181 lbs 3 oz   BMI  28.37   Systolic 163 175 034  Diastolic 79 78 82  Pulse  65    Wt Readings from Last 10 Encounters:  10/31/23 181 lb 3.2 oz (82.2 kg)  09/28/23 183 lb (83 kg)  08/12/23 183 lb 4.8 oz (83.1 kg)  07/22/23 175 lb (79.4 kg)  06/30/23 184 lb 3.2 oz (83.6 kg)  06/07/23 183 lb 6 oz (83.2 kg)  05/10/23 184 lb (83.5 kg)  05/05/23  150 lb (68 kg)  02/07/23 180 lb 6.4 oz (81.8 kg)  01/20/23 185 lb (83.9 kg)   Vital signs reviewed.  Nursing notes reviewed. Weight trend reviewed. Abnormalities and Problem-Specific physical exam findings:  EXTREMITIES: Requires use of hands to stand from seated position indicating difficulty with knee and/or hip function. MUSCULOSKELETAL: Completed physical therapy for knee and hip pain but still requires hands to stand, suggesting persistent functional limitations.  General Appearance:  No acute distress appreciable.   Well-groomed, healthy-appearing female.  Well proportioned with no abnormal fat distribution.  Good muscle tone. Pulmonary:  Normal work of breathing at rest, no respiratory distress apparent. SpO2: 100 %  Musculoskeletal: All extremities are intact.  Neurological:  Awake, alert, oriented, and engaged.  No obvious focal neurological deficits or cognitive impairments.  Sensorium seems unclouded.   Speech is clear and coherent with logical content. Psychiatric:  Appropriate mood, pleasant and cooperative demeanor, thoughtful and engaged during the exam  Results LABS Urinalysis: Hematuria (07/2023)  RADIOLOGY Coronary CT scan: No significant heart disease  DIAGNOSTIC REPORTS EGD: Normal findings Colonoscopy: Normal findings    No results found for any visits on 10/31/23. Admission on 07/22/2023, Discharged on 07/22/2023  Component Date Value   SARS Coronavirus 2 by RT* 07/22/2023 NEGATIVE    Influenza A by PCR 07/22/2023 NEGATIVE    Influenza B by PCR 07/22/2023 NEGATIVE    Resp Syncytial Virus by * 07/22/2023 NEGATIVE    Group A Strep by PCR 07/22/2023 NOT DETECTED    Lactic Acid, Venous 07/22/2023 0.9    Sodium 07/22/2023 138    Potassium 07/22/2023 3.5    Chloride 07/22/2023 105    CO2 07/22/2023 26    Glucose, Bld 07/22/2023 93    BUN 07/22/2023 13    Creatinine, Ser 07/22/2023 0.66    Calcium  07/22/2023 10.0    Total Protein 07/22/2023 7.0    Albumin   07/22/2023 3.9    AST 07/22/2023 21    ALT 07/22/2023 13    Alkaline Phosphatase 07/22/2023 77    Total Bilirubin 07/22/2023 0.5    GFR, Estimated 07/22/2023 >60    Anion gap 07/22/2023 7    WBC 07/22/2023 4.4    RBC 07/22/2023 4.03    Hemoglobin 07/22/2023 12.4    HCT 07/22/2023 37.0    MCV 07/22/2023 91.8    MCH 07/22/2023 30.8    MCHC 07/22/2023 33.5    RDW 07/22/2023 12.5    Platelets 07/22/2023 182    nRBC 07/22/2023 0.0    Neutrophils Relative % 07/22/2023 56    Neutro Abs 07/22/2023 2.5    Lymphocytes Relative 07/22/2023 26    Lymphs Abs 07/22/2023  1.2    Monocytes Relative 07/22/2023 15    Monocytes Absolute 07/22/2023 0.7    Eosinophils Relative 07/22/2023 3    Eosinophils Absolute 07/22/2023 0.1    Basophils Relative 07/22/2023 0    Basophils Absolute 07/22/2023 0.0    Immature Granulocytes 07/22/2023 0    Abs Immature Granulocytes 07/22/2023 0.01    Prothrombin Time 07/22/2023 13.5    INR 07/22/2023 1.0    aPTT 07/22/2023 27    Specimen Description 07/22/2023                     Value:BLOOD LEFT ANTECUBITAL Performed at [facility], [address] [place], [place] [address]    Special Requests 07/22/2023                     Value:BOTTLES DRAWN AEROBIC AND ANAEROBIC Blood Culture adequate volume Performed at [facility], [address], [place], [place] [address]    Culture 07/22/2023                     Value:NO GROWTH 5 DAYS Performed at [facility], [address], [place], [place] [address]    Report Status 07/22/2023 07/27/2023 FINAL    Specimen Description 07/22/2023                     Value:BLOOD BLOOD LEFT ARM Performed at [facility], [address] [place], [place] [address]    Special Requests 07/22/2023                     Value:BOTTLES DRAWN AEROBIC AND ANAEROBIC Blood Culture adequate volume Performed at [facility], [address] [place],  [place] [address]    Culture 07/22/2023                     Value:NO GROWTH 5 DAYS Performed at [facility], [address] [place], [place] [address]    Report Status 07/22/2023 07/27/2023 FINAL    Specimen Source 07/22/2023 URINE, CLEAN CATCH    Color, Urine 07/22/2023 YELLOW    APPearance 07/22/2023 CLEAR    Specific Gravity, Urine 07/22/2023 1.012    pH 07/22/2023 7.0    Glucose, UA 07/22/2023 NEGATIVE    Hgb urine dipstick 07/22/2023 SMALL (A)    Bilirubin Urine 07/22/2023 NEGATIVE    Ketones, ur 07/22/2023 NEGATIVE    Protein, ur 07/22/2023 NEGATIVE    Nitrite 07/22/2023 NEGATIVE    Leukocytes,Ua 07/22/2023 NEGATIVE    RBC / HPF 07/22/2023 6-10    WBC, UA 07/22/2023 0-5    Bacteria, UA 07/22/2023 NONE SEEN    Squamous Epithelial / HPF 07/22/2023 0-5    Mucus 07/22/2023 PRESENT   Appointment on 06/30/2023  Component Date Value   Sodium 06/30/2023 140    Potassium 06/30/2023 3.7    Chloride 06/30/2023 107    CO2 06/30/2023 28    Glucose, Bld 06/30/2023 83    BUN 06/30/2023 10    Creatinine 06/30/2023 0.75    Calcium  06/30/2023 9.6    Total Protein 06/30/2023 7.0    Albumin  06/30/2023 3.9    AST 06/30/2023 20    ALT 06/30/2023 12    Alkaline Phosphatase 06/30/2023 80    Total Bilirubin 06/30/2023 0.5    GFR, Estimated 06/30/2023 >60    Anion gap 06/30/2023 5    WBC 06/30/2023 3.2 (L)    RBC 06/30/2023  3.86 (L)    Hemoglobin 06/30/2023 12.3    HCT 06/30/2023 36.4    MCV 06/30/2023 94.3    MCH 06/30/2023 31.9    MCHC 06/30/2023 33.8    RDW 06/30/2023 12.4    Platelets 06/30/2023 160    nRBC 06/30/2023 0.0    Neutrophils Relative % 06/30/2023 28    Neutro Abs 06/30/2023 0.9 (L)    Lymphocytes Relative 06/30/2023 52    Lymphs Abs 06/30/2023 1.7    Monocytes Relative 06/30/2023 16    Monocytes Absolute 06/30/2023 0.5    Eosinophils Relative 06/30/2023 3    Eosinophils Absolute 06/30/2023 0.1    Basophils Relative 06/30/2023 1    Basophils Absolute 06/30/2023  0.0    Immature Granulocytes 06/30/2023 0    Abs Immature Granulocytes 06/30/2023 0.00   Office Visit on 05/05/2023  Component Date Value   TSH 05/05/2023 0.89    Sodium 05/05/2023 136    Potassium 05/05/2023 4.0    Chloride 05/05/2023 102    CO2 05/05/2023 26    Glucose, Bld 05/05/2023 79    BUN 05/05/2023 16    Creatinine, Ser 05/05/2023 0.73    Total Bilirubin 05/05/2023 0.4    Alkaline Phosphatase 05/05/2023 88    AST 05/05/2023 19    ALT 05/05/2023 12    Total Protein 05/05/2023 6.9    Albumin  05/05/2023 4.0    GFR 05/05/2023 81.13    Calcium  05/05/2023 9.9    WBC 05/05/2023 4.3    RBC 05/05/2023 3.97    Hemoglobin 05/05/2023 12.4    HCT 05/05/2023 37.7    MCV 05/05/2023 94.8    MCHC 05/05/2023 32.9    RDW 05/05/2023 13.7    Platelets 05/05/2023 185.0    Neutrophils Relative % 05/05/2023 33.8 (L)    Lymphocytes Relative 05/05/2023 47.1 (H)    Monocytes Relative 05/05/2023 16.1 (H)    Eosinophils Relative 05/05/2023 2.5    Basophils Relative 05/05/2023 0.5    Neutro Abs 05/05/2023 1.5    Lymphs Abs 05/05/2023 2.0    Monocytes Absolute 05/05/2023 0.7    Eosinophils Absolute 05/05/2023 0.1    Basophils Absolute 05/05/2023 0.0    Hgb A1c MFr Bld 05/05/2023 5.4    Vitamin B-12 05/05/2023 326    Folate 05/05/2023 16.7    VITD 05/05/2023 33.61   Office Visit on 02/07/2023  Component Date Value   Sodium 02/07/2023 140    Potassium 02/07/2023 3.9    Chloride 02/07/2023 103    CO2 02/07/2023 28    Glucose, Bld 02/07/2023 69 (L)    BUN 02/07/2023 10    Creatinine, Ser 02/07/2023 0.82    Total Bilirubin 02/07/2023 0.4    Alkaline Phosphatase 02/07/2023 81    AST 02/07/2023 22    ALT 02/07/2023 11    Total Protein 02/07/2023 6.7    Albumin  02/07/2023 3.9    GFR 02/07/2023 70.68    Calcium  02/07/2023 9.9    Amylase 02/07/2023 57    Lipase 02/07/2023 14   Office Visit on 11/30/2022  Component Date Value   H pylori Ag, Stl 12/13/2022 Negative   Scanned Document  on 11/16/2022  Component Date Value   EGFR 11/16/2022 77.0   Orders Only on 11/15/2022  Component Date Value   Amylase 11/15/2022 125 (H)    Lipase 11/15/2022 30   No image results found. MM 3D SCREENING MAMMOGRAM BILATERAL BREAST Result Date: 08/09/2023 CLINICAL DATA:  Screening. EXAM: DIGITAL SCREENING BILATERAL MAMMOGRAM WITH TOMOSYNTHESIS AND CAD TECHNIQUE:  Bilateral screening digital craniocaudal and mediolateral oblique mammograms were obtained. Bilateral screening digital breast tomosynthesis was performed. The images were evaluated with computer-aided detection. COMPARISON:  Previous exam(s). ACR Breast Density Category b: There are scattered areas of fibroglandular density. FINDINGS: There are no findings suspicious for malignancy. IMPRESSION: No mammographic evidence of malignancy. A result letter of this screening mammogram will be mailed directly to the patient. RECOMMENDATION: Screening mammogram in one year. (Code:SM-B-01Y) BI-RADS CATEGORY  1: Negative. Electronically Signed   By: Dobrinka  Dimitrova M.D.   On: 08/09/2023 14:23  MM 3D SCREENING MAMMOGRAM BILATERAL BREAST Result Date: 08/09/2023 CLINICAL DATA:  Screening. EXAM: DIGITAL SCREENING BILATERAL MAMMOGRAM WITH TOMOSYNTHESIS AND CAD TECHNIQUE: Bilateral screening digital craniocaudal and mediolateral oblique mammograms were obtained. Bilateral screening digital breast tomosynthesis was performed. The images were evaluated with computer-aided detection. COMPARISON:  Previous exam(s). ACR Breast Density Category b: There are scattered areas of fibroglandular density. FINDINGS: There are no findings suspicious for malignancy. IMPRESSION: No mammographic evidence of malignancy. A result letter of this screening mammogram will be mailed directly to the patient. RECOMMENDATION: Screening mammogram in one year. (Code:SM-B-01Y) BI-RADS CATEGORY  1: Negative. Electronically Signed   By: Dobrinka  Dimitrova M.D.   On: 08/09/2023 14:23       Assessment & Plan Hypertension, unspecified type Blood pressure remains uncontrolled, possibly due to white coat hypertension. Adjust medication to losartan  50 mg/hydrochlorothiazide  12.5 mg for better control. Upload blood pressure readings daily for one week. Follow up in one month for in-person evaluation. Need more home blood pressure readings to make more significant changes. Hiatal hernia Discussed importance of informing us  if gastritis symptom(s) return- may need to consider surgical repair. Discussed valsalva risks. Rheumatoid arthritis involving multiple sites, unspecified whether rheumatoid factor present (HCC) Discussed need to limit Celebrex  due to hiatal hernia and chronic kidney disease Continue(s) with hydroxychloroquine and rheumatologist follow up  Prediabetes Resolved/removed from active problem list based on review of chart/history  Personal history of colon cancer Colonoscopy 2024 reviewed, no plan follow up for 5 years  Other specified anxiety disorders Continue(s) with buspirone  NSVT (nonsustained ventricular tachycardia) (HCC) Encouraged patient to continue(s) with cardiology follow up  Other microscopic hematuria Hematuria Intermittent microscopic hematuria likely from occasional kidney stones. Malignancy is less likely due to partial hysterectomy and absence of other symptoms. Obtain a urine sample for cytology and discuss findings with an oncologist during the next visit. Consider gynecological evaluation based on results. Abnormal finding on imaging of liver Updated problem overview for this problem to improve longitudinal management. No plans for follow up at this time but MRI might be considered to further characterize in future. Aortic atherosclerosis (HCC) Atherosclerosis Mild atherosclerosis noted. Discuss potential benefits and risks of starting aspirin  and cholesterol medication with a cardiologist. Patient prefers to avoid anti-cholesterol medication  due to side effects. Consider ultrasound of the neck to assess for atherosclerosis. Difficulty standing Osteoarthritis and Rheumatoid Arthritis Left knee and thoracic spinal pain cause difficulty standing without using hands, likely due to knee and hip pain. Completed physical therapy but still requires hands to stand, which may strain wrists. Encourage techniques to reduce wrist strain when standing. Continue monitoring and managing pain as needed. Hyperlipidemia with target LDL less than 130 Atherosclerosis Mild atherosclerosis noted. Discuss potential benefits and risks of starting aspirin  and cholesterol medication with a cardiologist. Patient prefers to avoid anti-cholesterol medication due to side effects. Consider ultrasound of the neck to assess for atherosclerosis. Elevated amylase Resolved/removed from active problem list based  on review of chart and history today Gastric wall thickening Hiatal Hernia Previously symptomatic hiatal hernia is now controlled with Mylanta and Maalox. 12/2022 EGD and colonoscopy showed no other concerns though no biopsy taken. Continue current management with Mylanta and Maalox. No need for follow-up colonoscopy for a few years. Will resolve from problem list as presumed resolved for now after negative esophagoduodenoscopy.  Consider CT abdomen repeat in future possibly. Hematuria, unspecified type Hematuria Intermittent microscopic hematuria likely from occasional kidney stones. Malignancy is less likely due to partial hysterectomy and absence of other symptoms. Obtain a urine sample for cytology and discuss findings with an oncologist during the next visit. Consider gynecological evaluation based on results. Chronic kidney disease, stage 2 (mild) Chronic Kidney Disease Stage 2 Stage 2 chronic kidney disease is standard for age. Avoid Celebrex  due to nephrotoxic effects, especially when dehydrated or kidneys are under strain. Use Celebrex  only if pain from  lupus and rheumatoid arthritis flares up. General Health Maintenance Routine health maintenance discussed, including the need for gynecological evaluation and follow-up with oncology. Schedule gynecological evaluation and follow up with an oncologist for annual wellness in May.     Orders Placed During this Encounter:   Orders Placed This Encounter  Procedures   Urinalysis w microscopic + reflex cultur   Meds ordered this encounter  Medications   losartan -hydrochlorothiazide  (HYZAAR) 50-12.5 MG tablet    Sig: Take 1 tablet by mouth daily.    Dispense:  90 tablet    Refill:  3       This document was synthesized by artificial intelligence ([employer]) using HIPAA-compliant recording of the clinical interaction;   We discussed the use of AI scribe software for clinical note transcription with the patient, who gave verbal consent to proceed.    Additional Info: This encounter employed state-of-the-art, real-time, collaborative documentation. The patient actively reviewed and assisted in updating their electronic medical record on a shared screen, ensuring transparency and facilitating joint problem-solving for the problem list, overview, and plan. This approach promotes accurate, informed care. The treatment plan was discussed and reviewed in detail, including medication safety, potential side effects, and all patient questions. We confirmed understanding and comfort with the plan. Follow-up instructions were established, including contacting the office for any concerns, returning if symptoms worsen, persist, or new symptoms develop, and precautions for potential emergency department visits.

## 2023-10-31 NOTE — Assessment & Plan Note (Signed)
 Resolved/removed from active problem list based on review of chart/history

## 2023-10-31 NOTE — Assessment & Plan Note (Signed)
 Resolved/removed from active problem list based on review of chart and history today

## 2023-10-31 NOTE — Assessment & Plan Note (Signed)
 Colonoscopy 2024 reviewed, no plan follow up for 5 years

## 2023-10-31 NOTE — Assessment & Plan Note (Signed)
 Continue(s) with buspirone 

## 2023-11-01 LAB — URINALYSIS W MICROSCOPIC + REFLEX CULTURE
Bacteria, UA: NONE SEEN /[HPF]
Bilirubin Urine: NEGATIVE
Glucose, UA: NEGATIVE
Hgb urine dipstick: NEGATIVE
Hyaline Cast: NONE SEEN /[LPF]
Ketones, ur: NEGATIVE
Leukocyte Esterase: NEGATIVE
Nitrites, Initial: NEGATIVE
RBC / HPF: NONE SEEN /[HPF] (ref 0–2)
Specific Gravity, Urine: 1.026 (ref 1.001–1.035)
Squamous Epithelial / HPF: NONE SEEN /[HPF] (ref <=5)
WBC, UA: NONE SEEN /[HPF] (ref 0–5)
pH: 6 (ref 5.0–8.0)

## 2023-11-01 LAB — NO CULTURE INDICATED

## 2023-11-02 ENCOUNTER — Telehealth: Payer: Self-pay | Admitting: Family

## 2023-11-02 NOTE — Telephone Encounter (Signed)
Called and spoke patient. Patient states she has been having high BP readings for the past "3 weeks or so". Unable to give BP readings. Denies headaches, palpitations and SOB. Seen by PCP on 2/10 - BP: 163/79 - recommended to increase Losartan/hydrochlorothiazide 50/12.5 mg, take BP for 1 week and update PCP office with readings.  Patient preferred Dr. Duke Salvia or Gillian Shields, NP make adjustments to BP meds. Made pt aware that APP is not in office today but will let her know of info and call back with any recommendations. She verbalized understanding.

## 2023-11-02 NOTE — Telephone Encounter (Signed)
Pt called in stating her bp as high today at PCP appt (163/79 hr 65). She asked if Luther Parody, NP had any suggestions for her. She denies any other symptoms.

## 2023-11-03 ENCOUNTER — Telehealth: Payer: Self-pay | Admitting: Hematology and Oncology

## 2023-11-03 NOTE — Telephone Encounter (Signed)
Agree with PCP recommendation to increase Losartan-hydrochlorothiazide to 50-12.5mg  daily. Check BP once per day at least an hour after medications and report back readings in 1 week.  Difficult make further changes without home blood pressure readings that she does have element of whitecoat hypertension.  Alver Sorrow, NP

## 2023-11-03 NOTE — Telephone Encounter (Signed)
Left a voicemail on both patient's contacts in regards to scheduled appointment times/dates; left callback number for scheduling if needed for any changes in times/dates

## 2023-11-03 NOTE — Telephone Encounter (Signed)
Called patient, no answer at this time. Will call back at later time.

## 2023-11-04 NOTE — Telephone Encounter (Signed)
Called and left detailed VM for pt. (OK per DPR)

## 2023-12-20 DIAGNOSIS — H269 Unspecified cataract: Secondary | ICD-10-CM

## 2023-12-20 HISTORY — DX: Unspecified cataract: H26.9

## 2023-12-22 DIAGNOSIS — Z961 Presence of intraocular lens: Secondary | ICD-10-CM | POA: Diagnosis not present

## 2023-12-22 DIAGNOSIS — H2511 Age-related nuclear cataract, right eye: Secondary | ICD-10-CM | POA: Diagnosis not present

## 2023-12-22 DIAGNOSIS — H25811 Combined forms of age-related cataract, right eye: Secondary | ICD-10-CM | POA: Diagnosis not present

## 2023-12-27 ENCOUNTER — Other Ambulatory Visit (HOSPITAL_BASED_OUTPATIENT_CLINIC_OR_DEPARTMENT_OTHER): Payer: Self-pay | Admitting: Internal Medicine

## 2023-12-29 ENCOUNTER — Inpatient Hospital Stay: Payer: Medicare HMO | Attending: Hematology and Oncology | Admitting: Hematology and Oncology

## 2023-12-29 DIAGNOSIS — Z923 Personal history of irradiation: Secondary | ICD-10-CM | POA: Insufficient documentation

## 2023-12-29 DIAGNOSIS — R350 Frequency of micturition: Secondary | ICD-10-CM | POA: Insufficient documentation

## 2023-12-29 DIAGNOSIS — Z809 Family history of malignant neoplasm, unspecified: Secondary | ICD-10-CM | POA: Insufficient documentation

## 2023-12-29 DIAGNOSIS — R5383 Other fatigue: Secondary | ICD-10-CM | POA: Insufficient documentation

## 2023-12-29 DIAGNOSIS — Z841 Family history of disorders of kidney and ureter: Secondary | ICD-10-CM | POA: Insufficient documentation

## 2023-12-29 DIAGNOSIS — Z79899 Other long term (current) drug therapy: Secondary | ICD-10-CM | POA: Insufficient documentation

## 2023-12-29 DIAGNOSIS — N182 Chronic kidney disease, stage 2 (mild): Secondary | ICD-10-CM | POA: Insufficient documentation

## 2023-12-29 DIAGNOSIS — Z8261 Family history of arthritis: Secondary | ICD-10-CM | POA: Insufficient documentation

## 2023-12-29 DIAGNOSIS — Z823 Family history of stroke: Secondary | ICD-10-CM | POA: Insufficient documentation

## 2023-12-29 DIAGNOSIS — I129 Hypertensive chronic kidney disease with stage 1 through stage 4 chronic kidney disease, or unspecified chronic kidney disease: Secondary | ICD-10-CM | POA: Insufficient documentation

## 2023-12-29 DIAGNOSIS — Z8249 Family history of ischemic heart disease and other diseases of the circulatory system: Secondary | ICD-10-CM | POA: Insufficient documentation

## 2023-12-29 DIAGNOSIS — Z853 Personal history of malignant neoplasm of breast: Secondary | ICD-10-CM | POA: Insufficient documentation

## 2023-12-29 DIAGNOSIS — Z803 Family history of malignant neoplasm of breast: Secondary | ICD-10-CM | POA: Insufficient documentation

## 2023-12-29 DIAGNOSIS — Z818 Family history of other mental and behavioral disorders: Secondary | ICD-10-CM | POA: Insufficient documentation

## 2023-12-29 DIAGNOSIS — M549 Dorsalgia, unspecified: Secondary | ICD-10-CM | POA: Insufficient documentation

## 2024-01-05 ENCOUNTER — Encounter: Payer: Self-pay | Admitting: Hematology and Oncology

## 2024-01-05 ENCOUNTER — Inpatient Hospital Stay

## 2024-01-05 ENCOUNTER — Inpatient Hospital Stay: Admitting: Hematology and Oncology

## 2024-01-05 VITALS — BP 118/63 | HR 66 | Temp 97.6°F | Resp 17 | Wt 180.3 lb

## 2024-01-05 DIAGNOSIS — Z8261 Family history of arthritis: Secondary | ICD-10-CM | POA: Diagnosis not present

## 2024-01-05 DIAGNOSIS — Z853 Personal history of malignant neoplasm of breast: Secondary | ICD-10-CM | POA: Diagnosis not present

## 2024-01-05 DIAGNOSIS — I129 Hypertensive chronic kidney disease with stage 1 through stage 4 chronic kidney disease, or unspecified chronic kidney disease: Secondary | ICD-10-CM | POA: Diagnosis not present

## 2024-01-05 DIAGNOSIS — Z8249 Family history of ischemic heart disease and other diseases of the circulatory system: Secondary | ICD-10-CM | POA: Diagnosis not present

## 2024-01-05 DIAGNOSIS — Z841 Family history of disorders of kidney and ureter: Secondary | ICD-10-CM | POA: Diagnosis not present

## 2024-01-05 DIAGNOSIS — Z170 Estrogen receptor positive status [ER+]: Secondary | ICD-10-CM | POA: Diagnosis not present

## 2024-01-05 DIAGNOSIS — C50411 Malignant neoplasm of upper-outer quadrant of right female breast: Secondary | ICD-10-CM

## 2024-01-05 DIAGNOSIS — N182 Chronic kidney disease, stage 2 (mild): Secondary | ICD-10-CM | POA: Diagnosis not present

## 2024-01-05 DIAGNOSIS — Z803 Family history of malignant neoplasm of breast: Secondary | ICD-10-CM | POA: Diagnosis not present

## 2024-01-05 DIAGNOSIS — Z79899 Other long term (current) drug therapy: Secondary | ICD-10-CM | POA: Diagnosis not present

## 2024-01-05 DIAGNOSIS — Z818 Family history of other mental and behavioral disorders: Secondary | ICD-10-CM | POA: Diagnosis not present

## 2024-01-05 DIAGNOSIS — M549 Dorsalgia, unspecified: Secondary | ICD-10-CM | POA: Diagnosis not present

## 2024-01-05 DIAGNOSIS — R350 Frequency of micturition: Secondary | ICD-10-CM | POA: Diagnosis not present

## 2024-01-05 DIAGNOSIS — R5383 Other fatigue: Secondary | ICD-10-CM | POA: Diagnosis not present

## 2024-01-05 DIAGNOSIS — Z809 Family history of malignant neoplasm, unspecified: Secondary | ICD-10-CM | POA: Diagnosis not present

## 2024-01-05 DIAGNOSIS — Z923 Personal history of irradiation: Secondary | ICD-10-CM | POA: Diagnosis not present

## 2024-01-05 DIAGNOSIS — Z823 Family history of stroke: Secondary | ICD-10-CM | POA: Diagnosis not present

## 2024-01-05 LAB — CBC WITH DIFFERENTIAL/PLATELET
Abs Immature Granulocytes: 0.01 10*3/uL (ref 0.00–0.07)
Basophils Absolute: 0 10*3/uL (ref 0.0–0.1)
Basophils Relative: 1 %
Eosinophils Absolute: 0.1 10*3/uL (ref 0.0–0.5)
Eosinophils Relative: 3 %
HCT: 35.8 % — ABNORMAL LOW (ref 36.0–46.0)
Hemoglobin: 12.3 g/dL (ref 12.0–15.0)
Immature Granulocytes: 0 %
Lymphocytes Relative: 54 %
Lymphs Abs: 1.9 10*3/uL (ref 0.7–4.0)
MCH: 31.6 pg (ref 26.0–34.0)
MCHC: 34.4 g/dL (ref 30.0–36.0)
MCV: 92 fL (ref 80.0–100.0)
Monocytes Absolute: 0.7 10*3/uL (ref 0.1–1.0)
Monocytes Relative: 19 %
Neutro Abs: 0.8 10*3/uL — ABNORMAL LOW (ref 1.7–7.7)
Neutrophils Relative %: 23 %
Platelets: 155 10*3/uL (ref 150–400)
RBC: 3.89 MIL/uL (ref 3.87–5.11)
RDW: 12.6 % (ref 11.5–15.5)
WBC: 3.5 10*3/uL — ABNORMAL LOW (ref 4.0–10.5)
nRBC: 0 % (ref 0.0–0.2)

## 2024-01-05 LAB — URINALYSIS, COMPLETE (UACMP) WITH MICROSCOPIC
Bacteria, UA: NONE SEEN
Bilirubin Urine: NEGATIVE
Glucose, UA: NEGATIVE mg/dL
Hgb urine dipstick: NEGATIVE
Ketones, ur: NEGATIVE mg/dL
Leukocytes,Ua: NEGATIVE
Nitrite: NEGATIVE
Protein, ur: 30 mg/dL — AB
Specific Gravity, Urine: 1.021 (ref 1.005–1.030)
pH: 5 (ref 5.0–8.0)

## 2024-01-05 LAB — CMP (CANCER CENTER ONLY)
ALT: 13 U/L (ref 0–44)
AST: 20 U/L (ref 15–41)
Albumin: 4.1 g/dL (ref 3.5–5.0)
Alkaline Phosphatase: 85 U/L (ref 38–126)
Anion gap: 5 (ref 5–15)
BUN: 15 mg/dL (ref 8–23)
CO2: 30 mmol/L (ref 22–32)
Calcium: 9.6 mg/dL (ref 8.9–10.3)
Chloride: 105 mmol/L (ref 98–111)
Creatinine: 0.82 mg/dL (ref 0.44–1.00)
GFR, Estimated: >60 mL/min (ref >60)
Glucose, Bld: 83 mg/dL (ref 70–99)
Potassium: 3.5 mmol/L (ref 3.5–5.1)
Sodium: 140 mmol/L (ref 135–145)
Total Bilirubin: 0.5 mg/dL (ref 0.0–1.2)
Total Protein: 7.2 g/dL (ref 6.5–8.1)

## 2024-01-05 NOTE — Progress Notes (Signed)
 [facility]  [phone] [phone]     ID: [name] DOB: 01-Jan-1951  MR#: [id number]  EXB#:[id number]  Patient Care Team: Lula Olszewski, MD as PCP - General (Internal Medicine) Chilton Si, MD as PCP - Cardiology (Cardiology) Zenovia Jordan, MD as Consulting Physician (Rheumatology) Meredith Pel, NP as Nurse Practitioner (Gastroenterology) Betha Loa, MD as Consulting Physician (Orthopedic Surgery) Rachel Moulds, MD as Consulting Physician (Hematology and Oncology) Meryl Dare, MD (Inactive) as Consulting Physician (Gastroenterology)  CHIEF COMPLAINT: Estrogen receptor positive breast cancer  CURRENT TREATMENT: tamoxifen   INTERVAL HISTORY:  [name] returns today for follow-up of her estrogen receptor positive breast cancer.  She completed 10 years of tamoxifen and is here for follow-up.    Discussed the use of AI scribe software for clinical note transcription with the patient, who gave verbal consent to proceed.  History of Present Illness [name] is a 73 year old female with lupus who presents with back pain and increased urination.  She has been experiencing back pain for the last few days, accompanied by increased frequency of urination and fatigue. She is seeking a urine test to investigate these symptoms.  Her lupus was previously causing discomfort, but she is now able to tolerate Celebrex with the addition of Pepcid. She takes a multivitamin regularly.  She underwent breast surgery in 2009 and had cataract surgery recently, which has significantly improved her vision, especially at night. Rest of the pertinent 10 point ROS reviewed and neg.   COVID 19 VACCINATION STATUS: Pfizer x3, most recently 07/2020; infection 03/25/2021  BREAST CANCER HISTORY: From Dr. Feliz Beam earlier summary:  "#1 patient is status post lumpectomy with sentinel lymph node biopsy followed by radiation to the  right breast. In December 2009 patient began aromatase inhibitor but could not tolerate it.  #2 therefore she began in March 2010 tamoxifen 20 mg daily. However she has experienced significant amount of aches and pains and in December 2012 discontinued it. At this time she does not want to go back on any kind of antiestrogen therapy due to side effects.  #3 patient would like to go back on tamoxifen 20 mg daily. She recently attended finding your new normal seminar. And because of that she wants to she would like to go back on the tamoxifen. We discussed side effects again. She was sent to her pharmacy."  Her subsequent history is as detailed below   PAST MEDICAL HISTORY: Past Medical History:  Diagnosis Date   Acute pain of right shoulder 11/01/2022   Aortic atherosclerosis (HCC) 11/01/2022   On 2022 CT abdomen.   Arthritis    R knee, hands    Back pain    Blood transfusion    1986- post-partial hysterectomy   Breast cancer (HCC) 2009   right lumpectomy and radiation   Cataract    Chicken pox    Colon cancer (HCC) 2003   per pt report, cancer found in a colon polyp in 2003, procedure done in [place].  did not require surgery, chemo etc.     Depression    Difficulty standing 09/08/2022   Due to hip pain mainly- she agreed to physical therapy if insurance will cover   Diverticular disease 11/01/2022   On 2022 CT abd   Diverticulitis    Edema of both ankles    Elevated amylase 11/22/2022   Amylase  Component    Value    Date/Time           AMYLASE    57    02/07/2023 1054        Family history of breast cancer 10/24/2017   Flank pain 11/03/2007   Urgent care told UTI responsible but she reports she didn't have one.  History of kidney stones and kind of feels like that  Started 3 weeks ago            Gallbladder problem    Gastric wall thickening 12/15/2022   11/2022 CT abdomen: Focal nodular wall thickening is seen at the GE junction and gastric fundus and gastric  carcinoma cannot be excluded. Remainder of the stomach is unremarkable. No evidence of  obstruction, inflammatory process or abnormal fluid collections.  Diverticulosis is seen mainly involving the descending and sigmoid colon, however there is no evidence of diverticulitis.     Had esophagod   Gastritis    Generalized abdominal pain 12/15/2022   GERD (gastroesophageal reflux disease)    recently taken off anti reflux tx   Hepatitis    Hep. A- 1978   Hiatal hernia    History of stomach ulcers    Hypertension    Insomnia 02/17/2021   Flared with seeing sister summer 2024  Difficulty falling asleep and waking up  She snores but not aware of apnea-never had sleep study  She hasn't tried medication(s)  Getting about 4 hours nightly  No caffeine relationship  Doesn't seem related with arthritis  Is associated with racing thoughts.  Music and bible helps.         Intractable abdominal pain 12/15/2022   01/13/2023 interim history:   carafate, bentyl (price ineffective), glycopyrrolate (dry mouth) all stopped  Maalox helps, would like to dc proton pump inhibitor (PPI) stomach acid reducer.      Prior history  IMPRESSION:  Focal nodular wall thickening at the GE junction and gastric fundus.  Gastric carcinoma cannot be excluded. Consider upper endoscopy or  upper GI series for further evaluation.      Joint pain    Lactose intolerance    Lower extremity edema 01/11/2022   Lupus    Multiple gastric ulcers    Palpitations    Personal history of radiation therapy 2009   Prediabetes 09/08/2022   Lab Results Component Value Date  HGBA1C 5.7 (H) 06/29/2021     Rheumatic fever    Rheumatoid arthritis (HCC)    SLE (systemic lupus erythematosus related syndrome) (HCC)    Upper abdominal pain 11/01/2022   Radiates to the back and lower abdomen.has been present since late 2023 and we do not have a CAT scan yet for it.  X-ray abdomen showed only degenerative disc disease and chronic constipation.  Has visit  planned with gastrointestinal specialist 01/04/23     Has been taking bentyl and hydrocodone and it helps  Has not been taking prescribed omeprazole and carafate due to pharmacist misunderstanding    Urinary incontinence    UTI (urinary tract infection)     PAST SURGICAL HISTORY: Past Surgical History:  Procedure Laterality Date   ABDOMINAL HYSTERECTOMY     1986   APPENDECTOMY     1978 & tubal ligation    BIOPSY  07/25/2018   Procedure: BIOPSY;  Surgeon: Rachael Fee, MD;  Location: [facility] ENDOSCOPY;  Service: Endoscopy;;   BREAST EXCISIONAL BIOPSY Left 1998   BREAST LUMPECTOMY Bilateral    2009   BREAST  SURGERY     R breast lumpectomy   CHOLECYSTECTOMY     1978- open    ESOPHAGOGASTRODUODENOSCOPY (EGD) WITH PROPOFOL N/A 07/25/2018   Procedure: ESOPHAGOGASTRODUODENOSCOPY (EGD) WITH PROPOFOL;  Surgeon: Rachael Fee, MD;  Location: [facility] ENDOSCOPY;  Service: Endoscopy;  Laterality: N/A;   REPLACEMENT TOTAL KNEE Left    TONSILLECTOMY     1954   TOTAL KNEE ARTHROPLASTY  08/30/2011   Procedure: TOTAL KNEE ARTHROPLASTY;  Surgeon: Nestor Lewandowsky;  Location: [facility] OR;  Service: Orthopedics;  Laterality: Right;  Right Total Knee Arthroplasty   TUBAL LIGATION      FAMILY HISTORY Family History  Problem Relation Age of Onset   Breast cancer Mother 58   Hypertension Mother    Stroke Mother    Depression Mother    Heart attack Father    Other Father    Heart disease Father    Sudden death Father    Hypertension Sister    Hypertension Brother    Kidney disease Brother    Other Maternal Grandmother 102       brain tumor dx at 13- did not bx or do additional testing on tumor   Arthritis Maternal Grandmother    Cancer Paternal Grandmother 24       type unk   Breast cancer Maternal Aunt 44   Cancer Paternal Aunt 77       type unk   Cancer Other    Hypertension Other    Anesthesia problems Neg Hx    Hypotension Neg Hx    Malignant hyperthermia Neg Hx    Pseudochol deficiency Neg  Hx    Colon cancer Neg Hx    Esophageal cancer Neg Hx    Stomach cancer Neg Hx    Pancreatic cancer Neg Hx    Liver disease Neg Hx    the patient's father died from a heart attack at the age of 55. The patient's mother died at the age of 9. She had a history of breast cancer and one of her sisters was diagnosed with breast cancer at the age of 2. The patient had 5 brothers and 5 sisters. There is no other history of breast or ovarian cancer in the family to her knowledge.   GYNECOLOGIC HISTORY:  No LMP recorded. Patient has had a hysterectomy. Menarche age 72, first live birth age 74. The patient is GX P3. She had a simple hysterectomy in 1986 (no salpingo-oophorectomy).   SOCIAL HISTORY: (Updated February 2022). 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  She remarried in 2011. Her husband Alfredo Bach is retired from [employer]. The patient has one adopted son in addition to her own children. 3 of her children live in town 1 in [place]. She has 7 grandchildren. She attends [employer] locally.    ADVANCED DIRECTIVES: Not in place   HEALTH MAINTENANCE: Social History   Tobacco Use   Smoking status: Never   Smokeless tobacco: Never  Vaping Use   Vaping status: Never Used  Substance Use Topics   Alcohol use: No   Drug use: No     Colonoscopy:  PAP:  Bone density: 04/30/2008 was normal  Lipid panel:  Allergies  Allergen Reactions   Iodine Anaphylaxis and Hives   Iohexol Anaphylaxis     Code: HIVES, Desc: PT  IS ALLERGIC TO IODINATED CONTRAST; REACTION INCLUDES FACIAL SWELLING,HIVES,RESPIRATORY DISTRESS;PT HASN'T HAD SCAN W/PREMEDS.;REFUSED CONTRAST OF ANY KIND!  KR    Latex Anaphylaxis   Penicillins Anaphylaxis and Rash    Has patient had a PCN reaction causing immediate rash, facial/tongue/throat swelling, SOB or  lightheadedness with hypotension: Yes Has patient had a PCN reaction causing severe rash involving mucus membranes or skin necrosis: Yes Has patient had a PCN reaction that required hospitalization: No Has patient had a PCN reaction occurring within the last 10 years: No If all of the above answers are "NO", then may proceed with Cephalosporin use.    Celecoxib Other (See Comments)    Severe stomach pain   Lactose Other (See Comments)    Lactose intolerant-cramping  Lactose intolerant   Other Other (See Comments)   Penicillin G Other (See Comments)   Sulfa Antibiotics Hives    Current Outpatient Medications  Medication Sig Dispense Refill   busPIRone (BUSPAR) 10 MG tablet Take 1 tablet (10 mg total) by mouth 2 (two) times daily. 180 tablet 3   celecoxib (CELEBREX) 50 MG capsule TAKE 1 CAPSULE TWICE DAILY. 50 capsule 2   famotidine-calcium carbonate-magnesium hydroxide (PEPCID COMPLETE) 10-800-165 MG chewable tablet CHEW 1 TABLET 2 TIMES DAILYAS NEEDED. 100 tablet 11   hydrochlorothiazide (HYDRODIURIL) 25 MG tablet TAKE 1 TABLET (25 MG TOTAL) BY MOUTH DAILY. 90 tablet 3   hydroxychloroquine (PLAQUENIL) 200 MG tablet Take 200 mg by mouth 2 (two) times daily.     losartan (COZAAR) 25 MG tablet TAKE 1 TABLET (25 MG TOTAL) BY MOUTH DAILY. 90 tablet 1   losartan-hydrochlorothiazide (HYZAAR) 50-12.5 MG tablet Take 1 tablet by mouth daily. 90 tablet 3   metoprolol succinate (TOPROL-XL) 50 MG 24 hr tablet Take 1 tablet (50 mg total) by mouth daily. 90 tablet 3   Multiple Vitamins-Minerals (EMERGEN-C IMMUNE PLUS PO) Take 1 tablet by mouth daily.     Omeprazole Magnesium (PRILOSEC OTC PO) Take by mouth.     potassium chloride (KLOR-CON) 10 MEQ tablet TAKE 1 TABLET BY MOUTH EVERY DAY 90 tablet 1   No current facility-administered medications for this visit.    OBJECTIVE: African-American woman in no acute distress  Vitals:   01/05/24 1402  BP: 118/63  Pulse: 66  Resp: 17  Temp: 97.6 F  (36.4 C)  SpO2: 98%       Body mass index is 28.24 kg/m.    ECOG FS:1 - Symptomatic but completely ambulatory   Physical Exam Constitutional:      Appearance: Normal appearance.  Cardiovascular:     Rate and Rhythm: Normal rate and regular rhythm.  Chest:     Comments: Bilateral breast inspected and palpated.  No palpable masses or regional adenopathy. Musculoskeletal:     Cervical back: Normal range of motion and neck supple. No rigidity.  Lymphadenopathy:     Cervical: No cervical adenopathy.  Neurological:     Mental Status: She is alert.      LAB RESULTS:  CMP     Component Value Date/Time   NA 138 07/22/2023 1609   NA 141 07/14/2022 1338   NA 141 01/26/2017 1508   K 3.5 07/22/2023 1609   K 3.4 (L) 01/26/2017 1508   CL 105 07/22/2023 1609   CL 102 01/25/2013 0909   CO2 26 07/22/2023 1609   CO2 29 01/26/2017 1508   GLUCOSE 93 07/22/2023 1609   GLUCOSE 91 01/26/2017 1508   GLUCOSE 81 01/25/2013 0909   BUN  13 07/22/2023 1609   BUN 19 07/14/2022 1338   BUN 11.5 01/26/2017 1508   CREATININE 0.66 07/22/2023 1609   CREATININE 0.75 06/30/2023 1603   CREATININE 0.8 01/26/2017 1508   CALCIUM 10.0 07/22/2023 1609   CALCIUM 9.5 01/26/2017 1508   PROT 7.0 07/22/2023 1609   PROT 7.1 01/26/2017 1508   ALBUMIN 3.9 07/22/2023 1609   ALBUMIN 3.5 01/26/2017 1508   AST 21 07/22/2023 1609   AST 20 06/30/2023 1603   AST 22 01/26/2017 1508   ALT 13 07/22/2023 1609   ALT 12 06/30/2023 1603   ALT 17 01/26/2017 1508   ALKPHOS 77 07/22/2023 1609   ALKPHOS 72 01/26/2017 1508   BILITOT 0.5 07/22/2023 1609   BILITOT 0.5 06/30/2023 1603   BILITOT 0.38 01/26/2017 1508   GFRNONAA >60 07/22/2023 1609   GFRNONAA >60 06/30/2023 1603   GFRAA 91 09/04/2020 1037   GFRAA >60 02/15/2019 1436    INo results found for: "SPEP", "UPEP"  Lab Results  Component Value Date   WBC 4.4 07/22/2023   NEUTROABS 2.5 07/22/2023   HGB 12.4 07/22/2023   HCT 37.0 07/22/2023   MCV 91.8  07/22/2023   PLT 182 07/22/2023      Chemistry      Component Value Date/Time   NA 138 07/22/2023 1609   NA 141 07/14/2022 1338   NA 141 01/26/2017 1508   K 3.5 07/22/2023 1609   K 3.4 (L) 01/26/2017 1508   CL 105 07/22/2023 1609   CL 102 01/25/2013 0909   CO2 26 07/22/2023 1609   CO2 29 01/26/2017 1508   BUN 13 07/22/2023 1609   BUN 19 07/14/2022 1338   BUN 11.5 01/26/2017 1508   CREATININE 0.66 07/22/2023 1609   CREATININE 0.75 06/30/2023 1603   CREATININE 0.8 01/26/2017 1508      Component Value Date/Time   CALCIUM 10.0 07/22/2023 1609   CALCIUM 9.5 01/26/2017 1508   ALKPHOS 77 07/22/2023 1609   ALKPHOS 72 01/26/2017 1508   AST 21 07/22/2023 1609   AST 20 06/30/2023 1603   AST 22 01/26/2017 1508   ALT 13 07/22/2023 1609   ALT 12 06/30/2023 1603   ALT 17 01/26/2017 1508   BILITOT 0.5 07/22/2023 1609   BILITOT 0.5 06/30/2023 1603   BILITOT 0.38 01/26/2017 1508       Lab Results  Component Value Date   LABCA2 16 11/09/2010    No components found for: "LABCA125"  No results for input(s): "INR" in the last 168 hours.  Urinalysis    Component Value Date/Time   COLORURINE YELLOW 10/31/2023 1202   APPEARANCEUR CLEAR 10/31/2023 1202   LABSPEC 1.026 10/31/2023 1202   LABSPEC 1.015 01/26/2017 1523   PHURINE 6.0 10/31/2023 1202   GLUCOSEU NEGATIVE 10/31/2023 1202   GLUCOSEU NEGATIVE 09/08/2022 1237   GLUCOSEU Negative 01/26/2017 1523   HGBUR NEGATIVE 10/31/2023 1202   HGBUR negative 01/22/2010 1551   BILIRUBINUR NEGATIVE 07/22/2023 1609   BILIRUBINUR Negative 01/26/2017 1523   KETONESUR NEGATIVE 10/31/2023 1202   PROTEINUR TRACE (A) 10/31/2023 1202   UROBILINOGEN 1.0 09/08/2022 1237   UROBILINOGEN 0.2 01/26/2017 1523   NITRITE NEGATIVE 07/22/2023 1609   LEUKOCYTESUR NEGATIVE 07/22/2023 1609   LEUKOCYTESUR Small 01/26/2017 1523    STUDIES: No results found.    ASSESSMENT: 73 y.o. 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  (  2) status post right lumpectomy 03/20/2008 for invasive lobular carcinoma, multifocal, pT1a pNX, stage IA, grade 2, strongly estrogen and progesterone receptor positive, HER-2 negative. with negative margins.  (3) status post right axillary sentinel lymph node sampling 04/17/2008, the single sentinel lymph node being clear  (4) Oncotype score of 10 predicts an outside the breast risk of recurrence of 7% if the patient's only systemic therapy is tamoxifen for 5 years.  (5) Additional right breast biopsy 05/23/2008 to evaluate microcalcifications seen on pre-radiotherapy mammography showed only atypical lobular hyperplasia  (6) Completed radiation to the right breast 07/26/2008 (50 gray +14 gray boost).  (7) Started on anastrozole November 2009, with poor tolerance  (8). Started on tamoxifen March 2010, continued to December 2012, resumed December 2014   (a) tamoxifen discontinued February 2022  (9) status post simple hysterectomy 1986 (no salpingo-oophorectomy)  (10) genetics testing 10/31/2017 through the Common Hereditary Cancers Panel offered by [facility] found no deleterious mutations in APC, ATM, AXIN2, BARD1, BMPR1A, BRCA1, BRCA2, BRIP1, CDH1, CDKN2A (p14ARF), CDKN2A (p16INK4a), CKD4, CHEK2, CTNNA1, DICER1, EPCAM (Deletion/duplication testing only), GREM1 (promoter region deletion/duplication testing only), KIT, MEN1, MLH1, MSH2, MSH3, MSH6, MUTYH, NBN, NF1, NHTL1, PALB2, PDGFRA, PMS2, POLD1, POLE, PTEN, RAD50, RAD51C, RAD51D, SDHB, SDHC, SDHD, SMAD4, SMARCA4. STK11, TP53, TSC1, TSC2, and VHL.  The following genes were evaluated for sequence changes only: SDHA and HOXB13 c.251G>A variant only.  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    PLAN:  Shelbe is now 16 years out from definitive surgery for  her breast cancer with no evidence of disease recurrence.  This is very favorable. She completed more than 10 years of tamoxifen. No concern for recurrence. Mammogram from Nov, no evidence of malignancy No evidence of UTI on today's labs.  Leukopenia with moderate neutropenia. Likely secondary to lupus, Continue to monitor.  FU in 6 months, pt's preference.  Total time spent: 30 minutes,   *Total Encounter Time as defined by the Centers for Medicare and Medicaid Services includes, in addition to the face-to-face time of a patient visit (documented in the note above) non-face-to-face time: obtaining and reviewing outside history, ordering and reviewing medications, tests or procedures, care coordination (communications with other health care professionals or caregivers) and documentation in the medical record.

## 2024-01-06 LAB — URINE CULTURE: Culture: NO GROWTH

## 2024-01-09 ENCOUNTER — Encounter: Payer: Self-pay | Admitting: Internal Medicine

## 2024-01-09 DIAGNOSIS — M1991 Primary osteoarthritis, unspecified site: Secondary | ICD-10-CM | POA: Diagnosis not present

## 2024-01-09 DIAGNOSIS — Z6828 Body mass index (BMI) 28.0-28.9, adult: Secondary | ICD-10-CM | POA: Diagnosis not present

## 2024-01-09 DIAGNOSIS — Z79899 Other long term (current) drug therapy: Secondary | ICD-10-CM | POA: Diagnosis not present

## 2024-01-09 DIAGNOSIS — M329 Systemic lupus erythematosus, unspecified: Secondary | ICD-10-CM | POA: Diagnosis not present

## 2024-01-09 DIAGNOSIS — E663 Overweight: Secondary | ICD-10-CM | POA: Diagnosis not present

## 2024-01-10 ENCOUNTER — Other Ambulatory Visit: Payer: Self-pay | Admitting: Internal Medicine

## 2024-01-10 DIAGNOSIS — K2100 Gastro-esophageal reflux disease with esophagitis, without bleeding: Secondary | ICD-10-CM

## 2024-01-10 DIAGNOSIS — K449 Diaphragmatic hernia without obstruction or gangrene: Secondary | ICD-10-CM

## 2024-01-10 DIAGNOSIS — F418 Other specified anxiety disorders: Secondary | ICD-10-CM

## 2024-01-24 ENCOUNTER — Ambulatory Visit (INDEPENDENT_AMBULATORY_CARE_PROVIDER_SITE_OTHER): Payer: Medicare HMO

## 2024-01-24 ENCOUNTER — Telehealth: Payer: Self-pay

## 2024-01-24 VITALS — Ht 67.0 in | Wt 180.0 lb

## 2024-01-24 DIAGNOSIS — Z0000 Encounter for general adult medical examination without abnormal findings: Secondary | ICD-10-CM

## 2024-01-24 NOTE — Telephone Encounter (Signed)
 Spoke with pt via phone she did have appt with Brian Campanile is now completed. Copied from CRM [id number]. Topic: General - Other >> Jan 24, 2024  8:04 AM [name] wrote: Reason for WJX:BJYNWGN called this morning regarding her scheduled 8:00 AM telephone appointment. She stated that she has not yet received a call from the provider and wanted to confirm that she wasn't overlooked.

## 2024-01-24 NOTE — Progress Notes (Signed)
 Subjective:   [name] is a 73 y.o. who presents for a Medicare Wellness preventive visit.  Visit Complete: Virtual I connected with  [name] on 01/24/24 by a audio enabled telemedicine application and verified that I am speaking with the correct person using two identifiers.  Patient Location: Home  Provider Location: Office/Clinic  I discussed the limitations of evaluation and management by telemedicine. The patient expressed understanding and agreed to proceed.  Vital Signs: Because this visit was a virtual/telehealth visit, some criteria may be missing or patient reported. Any vitals not documented were not able to be obtained and vitals that have been documented are patient reported.  VideoDeclined- This patient declined Librarian, academic. Therefore the visit was completed with audio only.  Persons Participating in Visit: Patient.  AWV Questionnaire: No: Patient Medicare AWV questionnaire was not completed prior to this visit.  Cardiac Risk Factors include: advanced age (>92men, >44 women);hypertension;dyslipidemia     Objective:    Today's Vitals   01/24/24 0805  Weight: 180 lb (81.6 kg)  Height: 5\' 7"  (1.702 m)   Body mass index is 28.19 kg/m.     01/24/2024    8:19 AM 01/20/2023    8:11 AM 10/21/2022   10:28 AM 06/04/2022    9:49 PM 01/03/2022   12:16 PM 07/16/2021   10:13 PM 06/30/2021    9:56 PM  Advanced Directives  Does Patient Have a Medical Advance Directive? Yes No No No No No No  Type of Estate agent of [place];Living will        Copy of Healthcare Power of Attorney in Chart? No - copy requested        Would patient like information on creating a medical advance directive?  No - Patient declined No - Patient declined  Yes (ED - Information included in AVS) No - Patient declined No - Patient declined    Current Medications (verified) Outpatient Encounter Medications as of 01/24/2024   Medication Sig   busPIRone  (BUSPAR ) 10 MG tablet TAKE 1 TABLET TWICE A DAY   celecoxib  (CELEBREX ) 50 MG capsule TAKE 1 CAPSULE TWICE DAILY.   famotidine -calcium  carbonate-magnesium  hydroxide (PEPCID  COMPLETE) 10-800-165 MG chewable tablet CHEW 1 TABLET 2 TIMES DAILYAS NEEDED.   hydroxychloroquine (PLAQUENIL) 200 MG tablet Take 200 mg by mouth 2 (two) times daily.   losartan -hydrochlorothiazide  (HYZAAR) 50-12.5 MG tablet Take 1 tablet by mouth daily.   metoprolol  succinate (TOPROL -XL) 50 MG 24 hr tablet Take 1 tablet (50 mg total) by mouth daily.   Multiple Vitamins-Minerals (EMERGEN-C IMMUNE PLUS PO) Take 1 tablet by mouth daily.   potassium chloride  (KLOR-CON ) 10 MEQ tablet TAKE 1 TABLET BY MOUTH EVERY DAY (Patient taking differently: Take 10 mEq by mouth as needed.)   [DISCONTINUED] hydrochlorothiazide  (HYDRODIURIL ) 25 MG tablet TAKE 1 TABLET (25 MG TOTAL) BY MOUTH DAILY.   [DISCONTINUED] losartan  (COZAAR ) 25 MG tablet TAKE 1 TABLET (25 MG TOTAL) BY MOUTH DAILY.   [DISCONTINUED] omeprazole  (PRILOSEC) 20 MG capsule TAKE 1 CAPSULE DAILY   No facility-administered encounter medications on file as of 01/24/2024.    Allergies (verified) Iodine, Iohexol , Latex, Penicillins, Celecoxib , Lactose, Other, Penicillin g, and Sulfa  antibiotics   History: Past Medical History:  Diagnosis Date   Acute pain of right shoulder 11/01/2022   Aortic atherosclerosis (HCC) 11/01/2022   On 2022 CT abdomen.   Arthritis    R knee, hands    Back pain    Blood transfusion  1986- post-partial hysterectomy   Breast cancer (HCC) 2009   right lumpectomy and radiation   Cataract 12/2023   both eyes   Chicken pox    Colon cancer (HCC) 2003   per pt report, cancer found in a colon polyp in 2003, procedure done in [place].  did not require surgery, chemo etc.     Depression    Difficulty standing 09/08/2022   Due to hip pain mainly- she agreed to physical therapy if insurance will cover   Diverticular disease  11/01/2022   On 2022 CT abd   Diverticulitis    Edema of both ankles    Elevated amylase 11/22/2022   Amylase                 Component    Value    Date/Time           AMYLASE    57    02/07/2023 1054        Family history of breast cancer 10/24/2017   Flank pain 11/03/2007   Urgent care told UTI responsible but she reports she didn't have one.  History of kidney stones and kind of feels like that  Started 3 weeks ago            Gallbladder problem    Gastric wall thickening 12/15/2022   11/2022 CT abdomen: Focal nodular wall thickening is seen at the GE junction and gastric fundus and gastric carcinoma cannot be excluded. Remainder of the stomach is unremarkable. No evidence of  obstruction, inflammatory process or abnormal fluid collections.  Diverticulosis is seen mainly involving the descending and sigmoid colon, however there is no evidence of diverticulitis.     Had esophagod   Gastritis    Generalized abdominal pain 12/15/2022   GERD (gastroesophageal reflux disease)    recently taken off anti reflux tx   Hepatitis    Hep. A- 1978   Hiatal hernia    History of stomach ulcers    Hypertension    Insomnia 02/17/2021   Flared with seeing sister summer 2024  Difficulty falling asleep and waking up  She snores but not aware of apnea-never had sleep study  She hasn't tried medication(s)  Getting about 4 hours nightly  No caffeine relationship  Doesn't seem related with arthritis  Is associated with racing thoughts.  Music and bible helps.         Intractable abdominal pain 12/15/2022   01/13/2023 interim history:   carafate , bentyl  (price ineffective), glycopyrrolate  (dry mouth) all stopped  Maalox helps, would like to dc proton pump inhibitor (PPI) stomach acid reducer.      Prior history  IMPRESSION:  Focal nodular wall thickening at the GE junction and gastric fundus.  Gastric carcinoma cannot be excluded. Consider upper endoscopy or  upper GI series for further evaluation.      Joint pain     Lactose intolerance    Lower extremity edema 01/11/2022   Lupus    Multiple gastric ulcers    Palpitations    Personal history of radiation therapy 2009   Prediabetes 09/08/2022   Lab Results Component Value Date  HGBA1C 5.7 (H) 06/29/2021     Rheumatic fever    Rheumatoid arthritis (HCC)    SLE (systemic lupus erythematosus related syndrome) (HCC)    Upper abdominal pain 11/01/2022   Radiates to the back and lower abdomen.has been present since late 2023 and we do not have a CAT scan yet for it.  X-ray  abdomen showed only degenerative disc disease and chronic constipation.  Has visit planned with gastrointestinal specialist 01/04/23     Has been taking bentyl  and hydrocodone  and it helps  Has not been taking prescribed omeprazole  and carafate  due to pharmacist misunderstanding    Urinary incontinence    UTI (urinary tract infection)    Past Surgical History:  Procedure Laterality Date   ABDOMINAL HYSTERECTOMY     1986   APPENDECTOMY     1978 & tubal ligation    BIOPSY  07/25/2018   Procedure: BIOPSY;  Surgeon: Janel Medford, MD;  Location: [facility] ENDOSCOPY;  Service: Endoscopy;;   BREAST EXCISIONAL BIOPSY Left 1998   BREAST LUMPECTOMY Bilateral    2009   BREAST SURGERY     R breast lumpectomy   CHOLECYSTECTOMY     1978- open    ESOPHAGOGASTRODUODENOSCOPY (EGD) WITH PROPOFOL  N/A 07/25/2018   Procedure: ESOPHAGOGASTRODUODENOSCOPY (EGD) WITH PROPOFOL ;  Surgeon: Janel Medford, MD;  Location: [facility] ENDOSCOPY;  Service: Endoscopy;  Laterality: N/A;   REPLACEMENT TOTAL KNEE Left    TONSILLECTOMY     1954   TOTAL KNEE ARTHROPLASTY  08/30/2011   Procedure: TOTAL KNEE ARTHROPLASTY;  Surgeon: Ilean Mall;  Location: [facility] OR;  Service: Orthopedics;  Laterality: Right;  Right Total Knee Arthroplasty   TUBAL LIGATION     Family History  Problem Relation Age of Onset   Breast cancer Mother 30   Hypertension Mother    Stroke Mother    Depression Mother    Heart attack Father     Other Father    Heart disease Father    Sudden death Father    Hypertension Sister    Hypertension Brother    Kidney disease Brother    Other Maternal Grandmother 102       brain tumor dx at 5- did not bx or do additional testing on tumor   Arthritis Maternal Grandmother    Cancer Paternal Grandmother 59       type unk   Breast cancer Maternal Aunt 37   Cancer Paternal Aunt 63       type unk   Cancer Other    Hypertension Other    Anesthesia problems Neg Hx    Hypotension Neg Hx    Malignant hyperthermia Neg Hx    Pseudochol deficiency Neg Hx    Colon cancer Neg Hx    Esophageal cancer Neg Hx    Stomach cancer Neg Hx    Pancreatic cancer Neg Hx    Liver disease Neg Hx    Social History   Socioeconomic History   Marital status: Married    Spouse name: Not on file   Number of children: 4   Years of education: Not on file   Highest education level: Master's degree (e.g., MA, MS, MEng, MEd, MSW, MBA)  Occupational History   Occupation: Retired Engineer, civil (consulting)  Tobacco Use   Smoking status: Never   Smokeless tobacco: Never  Vaping Use   Vaping status: Never Used  Substance and Sexual Activity   Alcohol  use: No   Drug use: No   Sexual activity: Yes  Other Topics Concern   Not on file  Social History Narrative   ** Merged History Encounter **       Social Drivers of Health   Financial Resource Strain: Low Risk  (01/24/2024)   Overall Financial Resource Strain (CARDIA)    Difficulty of Paying Living Expenses: Not hard at all  Food Insecurity:  No Food Insecurity (01/24/2024)   Hunger Vital Sign    Worried About Running Out of Food in the Last Year: Never true    Ran Out of Food in the Last Year: Never true  Transportation Needs: No Transportation Needs (01/24/2024)   PRAPARE - Administrator, Civil Service (Medical): No    Lack of Transportation (Non-Medical): No  Physical Activity: Sufficiently Active (01/24/2024)   Exercise Vital Sign    Days of Exercise per  Week: 7 days    Minutes of Exercise per Session: 120 min  Stress: No Stress Concern Present (01/24/2024)   [employer] of Occupational Health - Occupational Stress Questionnaire    Feeling of Stress : Not at all  Social Connections: Socially Integrated (01/24/2024)   Social Connection and Isolation Panel [NHANES]    Frequency of Communication with Friends and Family: More than three times a week    Frequency of Social Gatherings with Friends and Family: More than three times a week    Attends Religious Services: More than 4 times per year    Active Member of [employer] or Organizations: Yes    Attends Banker Meetings: 1 to 4 times per year    Marital Status: Married    Tobacco Counseling Counseling given: Not Answered    Clinical Intake:  Pre-visit preparation completed: Yes  Pain : No/denies pain     BMI - recorded: 28.19 Nutritional Status: BMI 25 -29 Overweight Diabetes: No  Lab Results  Component Value Date   HGBA1C 5.4 05/05/2023   HGBA1C 5.6 09/08/2022   HGBA1C 5.7 (H) 06/29/2021     How often do you need to have someone help you when you read instructions, pamphlets, or other written materials from your doctor or pharmacy?: 1 - Never  Interpreter Needed?: No  Information entered by :: Lamont Pilsner, LPN   Activities of Daily Living     01/24/2024    8:07 AM  In your present state of health, do you have any difficulty performing the following activities:  Hearing? 1  Comment hearing aids  Vision? 0  Difficulty concentrating or making decisions? 0  Walking or climbing stairs? 0  Dressing or bathing? 0  Doing errands, shopping? 0  Preparing Food and eating ? N  Using the Toilet? N  In the past six months, have you accidently leaked urine? Y  Comment wears a panty liner  Do you have problems with loss of bowel control? N  Managing your Medications? N  Managing your Finances? N  Housekeeping or managing your Housekeeping? N    Patient  Care Team: Anthon Kins, MD as PCP - General (Internal Medicine) Maudine Sos, MD as PCP - Cardiology (Cardiology) Stefan Edge, MD as Consulting Physician (Rheumatology) Arlee Bellows, NP as Nurse Practitioner (Gastroenterology) Brunilda Capra, MD as Consulting Physician (Orthopedic Surgery) Murleen Arms, MD as Consulting Physician (Hematology and Oncology) Asencion Blacksmith, MD (Inactive) as Consulting Physician (Gastroenterology)  Indicate any recent Medical Services you may have received from other than [facility] providers in the past year (date may be approximate).     Assessment:   This is a routine wellness examination for Lynann.  Hearing/Vision screen Hearing Screening - Comments:: Pt has hearing aids  Vision Screening - Comments:: Wears rx glasses - up to date with routine eye exams with [facility] ophthalmology     Goals Addressed             This Visit's Progress  Patient Stated       Lose weight        Depression Screen     01/24/2024    8:15 AM 10/31/2023   10:43 AM 06/07/2023   10:52 AM 05/05/2023   10:38 AM 01/20/2023    8:10 AM 01/20/2023    8:08 AM 09/08/2022   10:09 AM  PHQ 2/9 Scores  PHQ - 2 Score 0 0 0 0 0 0 0  PHQ- 9 Score   3        Fall Risk     01/24/2024    8:20 AM 10/31/2023   10:43 AM 06/07/2023   10:52 AM 05/05/2023   10:38 AM 01/16/2023    8:43 AM  Fall Risk   Falls in the past year? 0 0 0 0 0  Number falls in past yr: 0 0 0 0 0  Injury with Fall? 0 0 0 0 0  Risk for fall due to : No Fall Risks No Fall Risks No Fall Risks No Fall Risks Impaired vision  Follow up Falls prevention discussed Falls evaluation completed Falls evaluation completed  Falls prevention discussed    MEDICARE RISK AT HOME:  Medicare Risk at Home Any stairs in or around the home?: Yes If so, are there any without handrails?: No Home free of loose throw rugs in walkways, pet beds, electrical cords, etc?: Yes Adequate lighting in your home to reduce  risk of falls?: Yes Life alert?: Yes Use of a cane, walker or w/c?: No Grab bars in the bathroom?: Yes Shower chair or bench in shower?: No Elevated toilet seat or a handicapped toilet?: No  TIMED UP AND GO:  Was the test performed?  No  Cognitive Function: 6CIT completed        01/24/2024    8:21 AM  6CIT Screen  What Year? 0 points  What month? 0 points  What time? 0 points  Count back from 20 0 points  Months in reverse 0 points  Repeat phrase 0 points  Total Score 0 points    Immunizations Immunization History  Administered Date(s) Administered   Fluad Trivalent(High Dose 65+) 07/19/2023   Influenza Whole 06/27/2009   Influenza, High Dose Seasonal PF 06/03/2019   Influenza-Unspecified 07/02/2015, 08/16/2020, 07/20/2022   PFIZER(Purple Top)SARS-COV-2 Vaccination 11/22/2019, 12/18/2019, 08/16/2020   Pneumococcal Conjugate-13 06/03/2019   Pneumococcal Polysaccharide-23 12/30/2020   Td 11/24/2007   Tdap 12/30/2020   Zoster Recombinant(Shingrix ) 12/15/2022    Screening Tests Health Maintenance  Topic Date Due   Zoster Vaccines- Shingrix  (2 of 2) 02/09/2023   INFLUENZA VACCINE  04/20/2024   Medicare Annual Wellness (AWV)  01/23/2025   MAMMOGRAM  08/04/2025   Colonoscopy  11/10/2026   DTaP/Tdap/Td (3 - Td or Tdap) 12/31/2030   Pneumonia Vaccine 32+ Years old  Completed   DEXA SCAN  Completed   HPV VACCINES  Aged Out   Meningococcal B Vaccine  Aged Out   COVID-19 Vaccine  Discontinued   Hepatitis C Screening  Discontinued    Health Maintenance  Health Maintenance Due  Topic Date Due   Zoster Vaccines- Shingrix  (2 of 2) 02/09/2023   Health Maintenance Items Addressed: See Nurse Notes  Additional Screening:  Vision Screening: Recommended annual ophthalmology exams for early detection of glaucoma and other disorders of the eye.  Dental Screening: Recommended annual dental exams for proper oral hygiene  Community Resource Referral / Chronic Care  Management: CRR required this visit?  No   CCM required this visit?  No     Plan:     I have personally reviewed and noted the following in the patient's chart:   Medical and social history Use of alcohol , tobacco or illicit drugs  Current medications and supplements including opioid prescriptions. Patient is not currently taking opioid prescriptions. Functional ability and status Nutritional status Physical activity Advanced directives List of other physicians Hospitalizations, surgeries, and ER visits in previous 12 months Vitals Screenings to include cognitive, depression, and falls Referrals and appointments  In addition, I have reviewed and discussed with patient certain preventive protocols, quality metrics, and best practice recommendations. A written personalized care plan for preventive services as well as general preventive health recommendations were provided to patient.     Bruno Capri, LPN   4/0/9811   After Visit Summary: (MyChart) Due to this being a telephonic visit, the after visit summary with patients personalized plan was offered to patient via MyChart   Notes: Nothing significant to report at this time.

## 2024-01-24 NOTE — Patient Instructions (Signed)
 [name] , Thank you for taking time to come for your Medicare Wellness Visit. I appreciate your ongoing commitment to your health goals. Please review the following plan we discussed and let me know if I can assist you in the future.   Referrals/Orders/Follow-Ups/Clinician Recommendations: continue to lose weight   This is a list of the screening recommended for you and due dates:  Health Maintenance  Topic Date Due   Zoster (Shingles) Vaccine (2 of 2) 02/09/2023   Flu Shot  04/20/2024   Medicare Annual Wellness Visit  01/23/2025   Mammogram  08/04/2025   Colon Cancer Screening  11/10/2026   DTaP/Tdap/Td vaccine (3 - Td or Tdap) 12/31/2030   Pneumonia Vaccine  Completed   DEXA scan (bone density measurement)  Completed   HPV Vaccine  Aged Out   Meningitis B Vaccine  Aged Out   COVID-19 Vaccine  Discontinued   Hepatitis C Screening  Discontinued    Advanced directives: (Copy Requested) Please bring a copy of your health care power of attorney and living will to the office to be added to your chart at your convenience. You can mail to [facility] [address] [place], [place] [address] or email to [email or web address]  Next Medicare Annual Wellness Visit scheduled for next year: Yes  Have you seen your provider in the last 6 months (3 months if uncontrolled diabetes)? Yes 10/31/23

## 2024-02-03 ENCOUNTER — Other Ambulatory Visit (HOSPITAL_BASED_OUTPATIENT_CLINIC_OR_DEPARTMENT_OTHER): Payer: Self-pay | Admitting: Family

## 2024-02-03 DIAGNOSIS — I10 Essential (primary) hypertension: Secondary | ICD-10-CM

## 2024-02-07 ENCOUNTER — Ambulatory Visit (INDEPENDENT_AMBULATORY_CARE_PROVIDER_SITE_OTHER): Admitting: Family Medicine

## 2024-02-07 VITALS — BP 138/72 | HR 62 | Temp 97.5°F | Resp 18 | Ht 67.0 in | Wt 183.0 lb

## 2024-02-07 DIAGNOSIS — R350 Frequency of micturition: Secondary | ICD-10-CM | POA: Diagnosis not present

## 2024-02-07 LAB — POC URINALSYSI DIPSTICK (AUTOMATED)
Bilirubin, UA: NEGATIVE
Blood, UA: NEGATIVE
Glucose, UA: NEGATIVE
Ketones, UA: NEGATIVE
Leukocytes, UA: NEGATIVE
Nitrite, UA: NEGATIVE
Protein, UA: NEGATIVE
Spec Grav, UA: 1.02 (ref 1.010–1.025)
Urobilinogen, UA: 0.2 U/dL
pH, UA: 6 (ref 5.0–8.0)

## 2024-02-07 MED ORDER — NITROFURANTOIN MONOHYD MACRO 100 MG PO CAPS: 100.0000 mg | ORAL_CAPSULE | Freq: Two times a day (BID) | ORAL | 0 refills | Status: DC

## 2024-02-07 NOTE — Progress Notes (Signed)
 Subjective:     Patient ID: [name], female    DOB: [birth year], 73 y.o.   MRN: [id number]  Chief Complaint  Patient presents with   Urinary Frequency    Urinary frequency and urine odor that started 2 days ago     HPI Discussed the use of AI scribe software for clinical note transcription with the patient, who gave verbal consent to proceed.  History of Present Illness [name] is a 73 year old female with lupus who presents with increased frequency of urination and lower back pain.  She has experienced increased frequency of urination for the past couple of days, with urination occurring approximately every two hours at night. This is a new symptom for her. She describes discomfort in the lower abdomen but no burning sensation during urination. Despite no changes in her diet or fluid intake, she notes a stronger odor in her urine.  She is experiencing a flare-up of her lupus, which is causing generalized body pain and slight fevers. No nausea or vomiting. Her past medical history includes lupus and diabetes. She is allergic to PCN, iodine, lactose, and sulfa , with a history of anaphylaxis to some of these allergens.    There are no preventive care reminders to display for this patient.  Past Medical History:  Diagnosis Date   Acute pain of right shoulder 11/01/2022   Aortic atherosclerosis (HCC) 11/01/2022   On 2022 CT abdomen.   Arthritis    R knee, hands    Back pain    Blood transfusion    1986- post-partial hysterectomy   Breast cancer (HCC) 2009   right lumpectomy and radiation   Cataract 12/2023   both eyes   Chicken pox    Colon cancer (HCC) 2003   per pt report, cancer found in a colon polyp in 2003, procedure done in [place].  did not require surgery, chemo etc.     Depression    Difficulty standing 09/08/2022   Due to hip pain mainly- she agreed to physical therapy if insurance will cover   Diverticular disease 11/01/2022   On 2022 CT abd    Diverticulitis    Edema of both ankles    Elevated amylase 11/22/2022   Amylase                 Component    Value    Date/Time           AMYLASE    57    02/07/2023 1054        Family history of breast cancer 10/24/2017   Flank pain 11/03/2007   Urgent care told UTI responsible but she reports she didn't have one.  History of kidney stones and kind of feels like that  Started 3 weeks ago            Gallbladder problem    Gastric wall thickening 12/15/2022   11/2022 CT abdomen: Focal nodular wall thickening is seen at the GE junction and gastric fundus and gastric carcinoma cannot be excluded. Remainder of the stomach is unremarkable. No evidence of  obstruction, inflammatory process or abnormal fluid collections.  Diverticulosis is seen mainly involving the descending and sigmoid colon, however there is no evidence of diverticulitis.     Had esophagod   Gastritis    Generalized abdominal pain 12/15/2022   GERD (gastroesophageal reflux disease)    recently taken off anti reflux tx   Hepatitis    Hep. A- 1978  Hiatal hernia    History of stomach ulcers    Hypertension    Insomnia 02/17/2021   Flared with seeing sister summer 2024  Difficulty falling asleep and waking up  She snores but not aware of apnea-never had sleep study  She hasn't tried medication(s)  Getting about 4 hours nightly  No caffeine relationship  Doesn't seem related with arthritis  Is associated with racing thoughts.  Music and bible helps.         Intractable abdominal pain 12/15/2022   01/13/2023 interim history:   carafate , bentyl  (price ineffective), glycopyrrolate  (dry mouth) all stopped  Maalox helps, would like to dc proton pump inhibitor (PPI) stomach acid reducer.      Prior history  IMPRESSION:  Focal nodular wall thickening at the GE junction and gastric fundus.  Gastric carcinoma cannot be excluded. Consider upper endoscopy or  upper GI series for further evaluation.      Joint pain    Lactose intolerance     Lower extremity edema 01/11/2022   Lupus    Multiple gastric ulcers    Palpitations    Personal history of radiation therapy 2009   Prediabetes 09/08/2022   Lab Results Component Value Date  HGBA1C 5.7 (H) 06/29/2021     Rheumatic fever    Rheumatoid arthritis (HCC)    SLE (systemic lupus erythematosus related syndrome) (HCC)    Upper abdominal pain 11/01/2022   Radiates to the back and lower abdomen.has been present since late 2023 and we do not have a CAT scan yet for it.  X-ray abdomen showed only degenerative disc disease and chronic constipation.  Has visit planned with gastrointestinal specialist 01/04/23     Has been taking bentyl  and hydrocodone  and it helps  Has not been taking prescribed omeprazole  and carafate  due to pharmacist misunderstanding    Urinary incontinence    UTI (urinary tract infection)     Past Surgical History:  Procedure Laterality Date   ABDOMINAL HYSTERECTOMY     1986   APPENDECTOMY     1978 & tubal ligation    BIOPSY  07/25/2018   Procedure: BIOPSY;  Surgeon: Janel Medford, MD;  Location: [facility] ENDOSCOPY;  Service: Endoscopy;;   BREAST EXCISIONAL BIOPSY Left 1998   BREAST LUMPECTOMY Bilateral    2009   BREAST SURGERY     R breast lumpectomy   CHOLECYSTECTOMY     1978- open    ESOPHAGOGASTRODUODENOSCOPY (EGD) WITH PROPOFOL  N/A 07/25/2018   Procedure: ESOPHAGOGASTRODUODENOSCOPY (EGD) WITH PROPOFOL ;  Surgeon: Janel Medford, MD;  Location: [facility] ENDOSCOPY;  Service: Endoscopy;  Laterality: N/A;   REPLACEMENT TOTAL KNEE Left    TONSILLECTOMY     1954   TOTAL KNEE ARTHROPLASTY  08/30/2011   Procedure: TOTAL KNEE ARTHROPLASTY;  Surgeon: Ilean Mall;  Location: [facility] OR;  Service: Orthopedics;  Laterality: Right;  Right Total Knee Arthroplasty   TUBAL LIGATION       Current Outpatient Medications:    busPIRone  (BUSPAR ) 10 MG tablet, TAKE 1 TABLET TWICE A DAY, Disp: 180 tablet, Rfl: 3   celecoxib  (CELEBREX ) 50 MG capsule, TAKE 1 CAPSULE TWICE DAILY.,  Disp: 50 capsule, Rfl: 2   famotidine -calcium  carbonate-magnesium  hydroxide (PEPCID  COMPLETE) 10-800-165 MG chewable tablet, CHEW 1 TABLET 2 TIMES DAILYAS NEEDED., Disp: 100 tablet, Rfl: 11   hydroxychloroquine (PLAQUENIL) 200 MG tablet, Take 200 mg by mouth 2 (two) times daily., Disp: , Rfl:    losartan -hydrochlorothiazide  (HYZAAR) 50-12.5 MG tablet, Take 1 tablet by mouth  daily., Disp: 90 tablet, Rfl: 3   metoprolol  succinate (TOPROL -XL) 50 MG 24 hr tablet, Take 1 tablet (50 mg total) by mouth daily., Disp: 90 tablet, Rfl: 3   Multiple Vitamins-Minerals (EMERGEN-C IMMUNE PLUS PO), Take 1 tablet by mouth daily., Disp: , Rfl:    nitrofurantoin , macrocrystal-monohydrate, (MACROBID ) 100 MG capsule, Take 1 capsule (100 mg total) by mouth 2 (two) times daily., Disp: 14 capsule, Rfl: 0   potassium chloride  (KLOR-CON ) 10 MEQ tablet, TAKE 1 TABLET BY MOUTH EVERY DAY (Patient taking differently: Take 10 mEq by mouth as needed.), Disp: 90 tablet, Rfl: 1  Allergies  Allergen Reactions   Iodine Anaphylaxis and Hives   Iohexol  Anaphylaxis     Code: HIVES, Desc: PT IS ALLERGIC TO IODINATED CONTRAST; REACTION INCLUDES FACIAL SWELLING,HIVES,RESPIRATORY DISTRESS;PT HASN'T HAD SCAN W/PREMEDS.;REFUSED CONTRAST OF ANY KIND!  KR    Latex Anaphylaxis   Penicillins Anaphylaxis and Rash    Has patient had a PCN reaction causing immediate rash, facial/tongue/throat swelling, SOB or lightheadedness with hypotension: Yes Has patient had a PCN reaction causing severe rash involving mucus membranes or skin necrosis: Yes Has patient had a PCN reaction that required hospitalization: No Has patient had a PCN reaction occurring within the last 10 years: No If all of the above answers are "NO", then may proceed with Cephalosporin use.    Celecoxib  Other (See Comments)    Severe stomach pain   Lactose Other (See Comments)    Lactose intolerant-cramping  Lactose intolerant   Other Other (See Comments)   Penicillin G  Other (See Comments)   Sulfa  Antibiotics Hives   ROS neg/noncontributory except as noted HPI/below      Objective:      BP 138/72 (BP Location: Left Arm, Patient Position: Sitting, Cuff Size: Normal)   Pulse 62   Temp (!) 97.5 F (36.4 C) (Temporal)   Resp 18   Ht 5\' 7"  (1.702 m)   Wt 183 lb (83 kg)   SpO2 99%   BMI 28.66 kg/m  Wt Readings from Last 3 Encounters:  02/07/24 183 lb (83 kg)  01/24/24 180 lb (81.6 kg)  01/05/24 180 lb 4.8 oz (81.8 kg)    Physical Exam   Gen: WDWN NAD HEENT: NCAT, conjunctiva not injected, sclera nonicteric NECK:  supple, no thyromegaly, no nodes, CARDIAC: RRR, S1S2+, no murmur.  LUNGS: CTAB. No wheezes ABDOMEN:  BS+, soft, tender abd diffusely. No HSM, no masses.  + R cvat but tender to palp as well EXT:  no edema MSK: no gross abnormalities.  NEURO: A&O x3.  CN II-XII intact.  PSYCH: normal mood. Good eye contact  Results for orders placed or performed in visit on 02/07/24  POCT Urinalysis Dipstick (Automated)   Collection Time: 02/07/24  3:13 PM  Result Value Ref Range   Color, UA YELLOW    Clarity, UA CLEAR    Glucose, UA Negative Negative   Bilirubin, UA NEG    Ketones, UA NEG    Spec Grav, UA 1.020 1.010 - 1.025   Blood, UA NEG    pH, UA 6.0 5.0 - 8.0   Protein, UA Negative Negative   Urobilinogen, UA 0.2 0.2 or 1.0 E.U./dL   Nitrite, UA NEG    Leukocytes, UA Negative Negative       Assessment & Plan:  Frequent urination -     POCT Urinalysis Dipstick (Automated) -     Urine Culture  Other orders -     Nitrofurantoin  Monohyd Macro;  Take 1 capsule (100 mg total) by mouth 2 (two) times daily.  Dispense: 14 capsule; Refill: 0  Assessment and Plan Assessment & Plan Urinary frequency and nocturia   She experiences increased urinary frequency and nocturia every two hours at night for a couple of days. Urine sample is normal, but culture results are pending. No dysuria is present, but there is discomfort in the lower  abdomen. Differential diagnosis includes a urinary tract infection despite normal initial urinalysis. Due to allergies, Macrobid  is chosen as the antibiotic. Prescribe Macrobid  for potential urinary tract infection. She should report if symptoms worsen or do not improve within 48 hours. Await urine culture results, expected in two days.  Systemic lupus erythematosus   She is experiencing a flare-up of systemic lupus erythematosus with generalized body pain. Lower back pain is likely related to lupus rather than renal origin. Abdominal tenderness is present, but no definitive rash is observed to suggest shingles.     Return if symptoms worsen or fail to improve.  Ellsworth Haas, MD

## 2024-02-07 NOTE — Patient Instructions (Signed)
 Drink more water.  Let me know worse, not getting better

## 2024-02-08 LAB — URINE CULTURE
MICRO NUMBER:: 16478821
SPECIMEN QUALITY:: ADEQUATE

## 2024-02-09 ENCOUNTER — Ambulatory Visit: Payer: Self-pay | Admitting: Family Medicine

## 2024-02-09 NOTE — Progress Notes (Signed)
 Urine culture negative.  How is she doing?

## 2024-02-29 ENCOUNTER — Ambulatory Visit: Payer: Self-pay

## 2024-02-29 NOTE — Telephone Encounter (Signed)
 Noted

## 2024-02-29 NOTE — Telephone Encounter (Signed)
 FYI Only or Action Required?: FYI only for provider  Patient was last seen in primary care on 02/07/2024 by Christel Cousins, MD. Called Nurse Triage reporting URI. Symptoms began yesterday. Interventions attempted: OTC medications: See note. Symptoms are: stable.  Triage Disposition: See Physician Within 24 Hours- Already scheduled.  Patient/caregiver understands and will follow disposition?: Yes     Copied from CRM [id number]. Topic: Clinical - Medical Advice >> Feb 29, 2024  3:06 PM Abigail D wrote: Reason for CRM: Patient is sick and wants to confirm that acetaminaphen is safe to take with celecoxib  (CELEBREX ) 50 MG capsule. Reason for Disposition  Earache  Answer Assessment - Initial Assessment Questions 1. ONSET: When did the nasal discharge start?      Yesterday   2. AMOUNT: How much discharge is there?      ------------- 3. COUGH: Do you have a cough? If Yes, ask: Describe the color of your sputum (clear, white, yellow, green)     ---------------- Yellow/ Brown    4. RESPIRATORY DISTRESS: Describe your breathing.      -------------   5. FEVER: Do you have a fever? If Yes, ask: What is your temperature, how was it measured, and when did it start?     --- 100F ( taken 2 hrs ago)   6. SEVERITY: Overall, how bad are you feeling right now? (e.g., doesn't interfere with normal activities, staying home from school/work, staying in bed)      ------------------ Does interfere with normal activities     7. OTHER SYMPTOMS: Do you have any other symptoms? (e.g., sore throat, earache, wheezing, vomiting)     ------------------ Runny nose, sore throat ( gargling with salt water)     Additional Inform: Patient already scheduled for a Video Visit tomorrow.  Patient inquiring in safe to take Acetaminophen , while on Celebrex . Educated pt that there are no known interaction between Celebrex  and Acetaminophen , so it can be used as an antipyretic at this time.  Pt  confirmed she has no known Liver issues and has never been advised to stay away from Acetaminophen  from a provider.  Patient informed to take Acetaminophen  as directed. Patient verbalized understanding. No additional questions/concerns noted during this time.  Protocols used: Common Cold-A-AH

## 2024-03-01 ENCOUNTER — Telehealth (INDEPENDENT_AMBULATORY_CARE_PROVIDER_SITE_OTHER): Admitting: Family

## 2024-03-01 DIAGNOSIS — J029 Acute pharyngitis, unspecified: Secondary | ICD-10-CM | POA: Diagnosis not present

## 2024-03-01 DIAGNOSIS — R051 Acute cough: Secondary | ICD-10-CM

## 2024-03-01 MED ORDER — LIDOCAINE VISCOUS HCL 2 % MT SOLN: 15.0000 mL | OROMUCOSAL | 0 refills | Status: DC | PRN

## 2024-03-01 MED ORDER — BENZONATATE 200 MG PO CAPS: 200.0000 mg | ORAL_CAPSULE | Freq: Three times a day (TID) | ORAL | 0 refills | Status: DC | PRN

## 2024-03-01 NOTE — Progress Notes (Signed)
 MyChart Video Visit    Virtual Visit via Video Note   This format is felt to be most appropriate for this patient at this time. Physical exam was limited by quality of the video and audio technology used for the visit. CMA was able to get the patient set up on a video visit.  Patient location: Home. Patient and provider in visit Provider location: Office  I discussed the limitations of evaluation and management by telemedicine and the availability of in person appointments. The patient expressed understanding and agreed to proceed.  Visit Date: 03/01/2024  Today's healthcare provider: Versa Gore, NP     Subjective:   Patient ID: [name], female    DOB: Jan 31, 1951, 73 y.o.   MRN: [id number]  Chief Complaint  Patient presents with   Sore Throat    Sore throat, runny nose and took temperature of 100.4 this morning. Congestions and aches. Patient noticed this yesterday, hasn't tested at home. No testing at all, taking tylenol  helping a little with aches and pain.    History of Present Illness   [name] is a 73 year old female with lupus who presents with a cough and chest discomfort.  She has a productive cough with yellow and brown phlegm for two days, severe enough to disrupt sleep. Chest discomfort is more bothersome than her low-grade fever of 100F. She manages symptoms with Tylenol  and is cautious about other medications due to lupus and Celebrex  use. She cannot take ibuprofen  or Aleve. She recalls Glenys Lapine were effective previously and inquires about Chloraseptic spray for throat discomfort. No history of COPD or asthma.     Acute Viral Upper Respiratory Infection Symptoms suggest viral etiology. Limited medication options due to lupus and Celebrex  use. Recommended symptomatic treatment. - Prescribe Tessalon Perles for cough relief. - Advise continued use of Tylenol  to manage fever. - Recommend hydration, at least 2 liters daily. -  Recommend OTC saline nasal spray for disinfection and moisture. - Prescribe Lidocaine  oral rinse for throat discomfort as needed. - Scheduled follow-up with Dr. Boston Byers next week and notify him if sx are not better.   Past Medical History:  Diagnosis Date   Acute pain of right shoulder 11/01/2022   Aortic atherosclerosis (HCC) 11/01/2022   On 2022 CT abdomen.   Arthritis    R knee, hands    Back pain    Blood transfusion    1986- post-partial hysterectomy   Breast cancer (HCC) 2009   right lumpectomy and radiation   Cataract 12/2023   both eyes   Chicken pox    Colon cancer (HCC) 2003   per pt report, cancer found in a colon polyp in 2003, procedure done in [place].  did not require surgery, chemo etc.     Depression    Difficulty standing 09/08/2022   Due to hip pain mainly- she agreed to physical therapy if insurance will cover   Diverticular disease 11/01/2022   On 2022 CT abd   Diverticulitis    Edema of both ankles    Elevated amylase 11/22/2022   Amylase                 Component    Value    Date/Time           AMYLASE    57    02/07/2023 1054        Family history of breast cancer 10/24/2017   Flank pain 11/03/2007   Urgent care  told UTI responsible but she reports she didn't have one.  History of kidney stones and kind of feels like that  Started 3 weeks ago            Gallbladder problem    Gastric wall thickening 12/15/2022   11/2022 CT abdomen: Focal nodular wall thickening is seen at the GE junction and gastric fundus and gastric carcinoma cannot be excluded. Remainder of the stomach is unremarkable. No evidence of  obstruction, inflammatory process or abnormal fluid collections.  Diverticulosis is seen mainly involving the descending and sigmoid colon, however there is no evidence of diverticulitis.     Had esophagod   Gastritis    Generalized abdominal pain 12/15/2022   GERD (gastroesophageal reflux disease)    recently taken off anti reflux tx   Hepatitis    Hep.  A- 1978   Hiatal hernia    History of stomach ulcers    Hypertension    Insomnia 02/17/2021   Flared with seeing sister summer 2024  Difficulty falling asleep and waking up  She snores but not aware of apnea-never had sleep study  She hasn't tried medication(s)  Getting about 4 hours nightly  No caffeine relationship  Doesn't seem related with arthritis  Is associated with racing thoughts.  Music and bible helps.         Intractable abdominal pain 12/15/2022   01/13/2023 interim history:   carafate , bentyl  (price ineffective), glycopyrrolate  (dry mouth) all stopped  Maalox helps, would like to dc proton pump inhibitor (PPI) stomach acid reducer.      Prior history  IMPRESSION:  Focal nodular wall thickening at the GE junction and gastric fundus.  Gastric carcinoma cannot be excluded. Consider upper endoscopy or  upper GI series for further evaluation.      Joint pain    Lactose intolerance    Lower extremity edema 01/11/2022   Lupus    Multiple gastric ulcers    Palpitations    Personal history of radiation therapy 2009   Prediabetes 09/08/2022   Lab Results Component Value Date  HGBA1C 5.7 (H) 06/29/2021     Rheumatic fever    Rheumatoid arthritis (HCC)    SLE (systemic lupus erythematosus related syndrome) (HCC)    Upper abdominal pain 11/01/2022   Radiates to the back and lower abdomen.has been present since late 2023 and we do not have a CAT scan yet for it.  X-ray abdomen showed only degenerative disc disease and chronic constipation.  Has visit planned with gastrointestinal specialist 01/04/23     Has been taking bentyl  and hydrocodone  and it helps  Has not been taking prescribed omeprazole  and carafate  due to pharmacist misunderstanding    Urinary incontinence    UTI (urinary tract infection)     Past Surgical History:  Procedure Laterality Date   ABDOMINAL HYSTERECTOMY     1986   APPENDECTOMY     1978 & tubal ligation    BIOPSY  07/25/2018   Procedure: BIOPSY;  Surgeon:  Janel Medford, MD;  Location: [facility] ENDOSCOPY;  Service: Endoscopy;;   BREAST EXCISIONAL BIOPSY Left 1998   BREAST LUMPECTOMY Bilateral    2009   BREAST SURGERY     R breast lumpectomy   CHOLECYSTECTOMY     1978- open    ESOPHAGOGASTRODUODENOSCOPY (EGD) WITH PROPOFOL  N/A 07/25/2018   Procedure: ESOPHAGOGASTRODUODENOSCOPY (EGD) WITH PROPOFOL ;  Surgeon: Janel Medford, MD;  Location: [facility] ENDOSCOPY;  Service: Endoscopy;  Laterality: N/A;   REPLACEMENT TOTAL KNEE  Left    TONSILLECTOMY     1954   TOTAL KNEE ARTHROPLASTY  08/30/2011   Procedure: TOTAL KNEE ARTHROPLASTY;  Surgeon: Ilean Mall;  Location: [facility] OR;  Service: Orthopedics;  Laterality: Right;  Right Total Knee Arthroplasty   TUBAL LIGATION      Outpatient Medications Prior to Visit  Medication Sig Dispense Refill   busPIRone  (BUSPAR ) 10 MG tablet TAKE 1 TABLET TWICE A DAY 180 tablet 3   celecoxib  (CELEBREX ) 50 MG capsule TAKE 1 CAPSULE TWICE DAILY. 50 capsule 2   famotidine -calcium  carbonate-magnesium  hydroxide (PEPCID  COMPLETE) 10-800-165 MG chewable tablet CHEW 1 TABLET 2 TIMES DAILYAS NEEDED. 100 tablet 11   hydroxychloroquine (PLAQUENIL) 200 MG tablet Take 200 mg by mouth 2 (two) times daily.     losartan -hydrochlorothiazide  (HYZAAR) 50-12.5 MG tablet Take 1 tablet by mouth daily. 90 tablet 3   metoprolol  succinate (TOPROL -XL) 50 MG 24 hr tablet Take 1 tablet (50 mg total) by mouth daily. 90 tablet 3   Multiple Vitamins-Minerals (EMERGEN-C IMMUNE PLUS PO) Take 1 tablet by mouth daily.     nitrofurantoin , macrocrystal-monohydrate, (MACROBID ) 100 MG capsule Take 1 capsule (100 mg total) by mouth 2 (two) times daily. 14 capsule 0   potassium chloride  (KLOR-CON ) 10 MEQ tablet TAKE 1 TABLET BY MOUTH EVERY DAY (Patient taking differently: Take 10 mEq by mouth as needed.) 90 tablet 1   No facility-administered medications prior to visit.    Allergies  Allergen Reactions   Iodine Anaphylaxis and Hives   Iohexol  Anaphylaxis      Code: HIVES, Desc: PT IS ALLERGIC TO IODINATED CONTRAST; REACTION INCLUDES FACIAL SWELLING,HIVES,RESPIRATORY DISTRESS;PT HASN'T HAD SCAN W/PREMEDS.;REFUSED CONTRAST OF ANY KIND!  KR    Latex Anaphylaxis   Penicillins Anaphylaxis and Rash    Has patient had a PCN reaction causing immediate rash, facial/tongue/throat swelling, SOB or lightheadedness with hypotension: Yes Has patient had a PCN reaction causing severe rash involving mucus membranes or skin necrosis: Yes Has patient had a PCN reaction that required hospitalization: No Has patient had a PCN reaction occurring within the last 10 years: No If all of the above answers are NO, then may proceed with Cephalosporin use.    Celecoxib  Other (See Comments)    Severe stomach pain   Lactose Other (See Comments)    Lactose intolerant-cramping  Lactose intolerant   Other Other (See Comments)   Penicillin G Other (See Comments)   Sulfa  Antibiotics Hives       Objective:   Physical Exam Vitals and nursing note reviewed.  Constitutional:      General: Pt is not in acute distress, but does not look well, sounds congested in sinuses.    Appearance: Normal appearance.  HENT:     Head: Normocephalic.  Pulmonary:     Effort: No respiratory distress.  Musculoskeletal:     Cervical back: Normal range of motion.  Skin:    General: Skin is dry.     Coloration: Skin is not pale.  Neurological:     Mental Status: Pt is alert and oriented to person, place, and time.  Psychiatric:        Mood and Affect: Mood normal.   There were no vitals taken for this visit.  Wt Readings from Last 3 Encounters:  02/07/24 183 lb (83 kg)  01/24/24 180 lb (81.6 kg)  01/05/24 180 lb 4.8 oz (81.8 kg)     I discussed the assessment and treatment plan with the patient. The patient was provided  an opportunity to ask questions and all were answered. The patient agreed with the plan and demonstrated an understanding of the instructions.   The patient was  advised to call back or seek an in-person evaluation if the symptoms worsen or if the condition fails to improve as anticipated.  Versa Gore, NP [facility] [phone] (phone) [phone] (fax)  [facility]

## 2024-03-06 ENCOUNTER — Encounter: Payer: Self-pay | Admitting: Internal Medicine

## 2024-03-07 ENCOUNTER — Encounter: Payer: Self-pay | Admitting: Internal Medicine

## 2024-03-07 ENCOUNTER — Ambulatory Visit (INDEPENDENT_AMBULATORY_CARE_PROVIDER_SITE_OTHER): Admitting: Internal Medicine

## 2024-03-07 VITALS — BP 150/86 | HR 77 | Temp 97.8°F | Ht 67.0 in | Wt 183.6 lb

## 2024-03-07 DIAGNOSIS — L309 Dermatitis, unspecified: Secondary | ICD-10-CM | POA: Diagnosis not present

## 2024-03-07 DIAGNOSIS — R051 Acute cough: Secondary | ICD-10-CM

## 2024-03-07 DIAGNOSIS — J320 Chronic maxillary sinusitis: Secondary | ICD-10-CM | POA: Diagnosis not present

## 2024-03-07 LAB — POCT INFLUENZA A/B
Influenza A, POC: NEGATIVE
Influenza B, POC: NEGATIVE

## 2024-03-07 LAB — POC COVID19 BINAXNOW: SARS Coronavirus 2 Ag: NEGATIVE

## 2024-03-07 MED ORDER — AZITHROMYCIN 250 MG PO TABS: ORAL_TABLET | ORAL | 0 refills | Status: AC

## 2024-03-07 MED ORDER — PROMETHAZINE-DM 6.25-15 MG/5ML PO SYRP: 5.0000 mL | ORAL_SOLUTION | Freq: Four times a day (QID) | ORAL | 0 refills | Status: DC | PRN

## 2024-03-07 MED ORDER — SIMPLY SALINE 0.9 % NA AERS: 2.0000 | INHALATION_SPRAY | NASAL | 11 refills | Status: DC

## 2024-03-07 MED ORDER — FLUTICASONE PROPIONATE 50 MCG/ACT NA SUSP: 2.0000 | Freq: Every day | NASAL | 6 refills | Status: DC

## 2024-03-07 MED ORDER — LORATADINE 10 MG PO TABS: 10.0000 mg | ORAL_TABLET | Freq: Every day | ORAL | 11 refills | Status: DC

## 2024-03-07 MED ORDER — PSEUDOEPHEDRINE HCL ER 120 MG PO TB12: 120.0000 mg | ORAL_TABLET | Freq: Two times a day (BID) | ORAL | 0 refills | Status: DC

## 2024-03-07 NOTE — Progress Notes (Signed)
 ==============================  [facility]: [phone]   -- Medical Office Visit --  Patient: [name]      Age: 73 y.o.       Sex:  female  Date:   03/07/2024 Today's Healthcare Provider: Anthon Kins, MD  ==============================   Chief Complaint: Cough (Coughing congestion in chest started Saturday coughing up yellow mucus. Really weak nausea  )  Discussed the use of AI scribe software for clinical note transcription with the patient, who gave verbal consent to proceed.  History of Present Illness 73 year old female with lupus who presents with a productive cough and associated symptoms.  Symptoms began around June 10th with a productive cough producing yellow and brown phlegm, severe enough to disrupt sleep. She also experienced low-grade fevers up to 100 degrees. She has been avoiding Tylenol , Celebrex , ibuprofen , and Aleve due to past issues. Previously effective treatments like Duzallo and Pearls did not help this time. She used lidocaine  for throat discomfort, which provided relief.  On the eighth day of symptoms, she felt nauseous, dizzy, and experienced intermittent congestion. She continues to have phlegm when coughing and feels generally lousy with fatigue and body aches. No muscle tenderness, brain fog, or myalgias. She reports sinus congestion.  She mentions breaking out in rashes, which she associates with her lupus. She has a rash on her neck and back that comes and goes, and she has been using a combination of antibiotic cream and hydrocortisone, which has improved the rash.  She experiences headaches and chest pain associated with coughing, and discomfort when taking deep breaths. She notes a bruise on her tonsil.  Background Reviewed: Problem List: has Malignant neoplasm of upper-outer quadrant of right breast in female, estrogen receptor positive (HCC); HTN (hypertension); Hiatal hernia; Lupus; Personal  history of colon cancer; History of breast cancer; Family history of coronary artery disease in father; Hyperlipidemia with target LDL less than 130; LVH (left ventricular hypertrophy) due to hypertensive disease, without heart failure; Weight disorder; Primary localized osteoarthrosis of multiple sites; Rheumatoid arthritis (HCC); GERD (gastroesophageal reflux disease); Difficulty standing; Hand arthritis; Aortic atherosclerosis (HCC); Diverticular disease; Chronic idiopathic constipation; DDD (degenerative disc disease), thoracic; Abnormal finding on imaging of liver; Chronic kidney disease, stage 2 (mild); Diastolic dysfunction; NSVT (nonsustained ventricular tachycardia) (HCC); Adjustment disorder; Chronic pain of left knee; Other specified anxiety disorders; and Other microscopic hematuria on their problem list. Past Medical History:  has a past medical history of Acute pain of right shoulder (11/01/2022), Allergy (1967), Anxiety, Aortic atherosclerosis (HCC) (11/01/2022), Arthritis, Back pain, Blood transfusion, Blood transfusion without reported diagnosis, Breast cancer (HCC) (2009), Cataract (12/2023), Chicken pox, Colon cancer (HCC) (2003), Depression, Difficulty standing (09/08/2022), Diverticular disease (11/01/2022), Diverticulitis, Edema of both ankles, Elevated amylase (11/22/2022), Family history of breast cancer (10/24/2017), Flank pain (11/03/2007), Gallbladder problem, Gastric wall thickening (12/15/2022), Gastritis, Generalized abdominal pain (12/15/2022), GERD (gastroesophageal reflux disease), Heart murmur (1954), Hepatitis, Hiatal hernia, History of stomach ulcers, Hypertension, Insomnia (02/17/2021), Intractable abdominal pain (12/15/2022), Joint pain, Lactose intolerance, Lower extremity edema (01/11/2022), Lupus, Multiple gastric ulcers, Palpitations, Personal history of radiation therapy (2009), Prediabetes (09/08/2022), Rheumatic fever, Rheumatoid arthritis (HCC), SLE (systemic lupus  erythematosus related syndrome) (HCC), Upper abdominal pain (11/01/2022), Urinary incontinence, and UTI (urinary tract infection). Past Surgical History:   has a past surgical history that includes Tonsillectomy; Appendectomy; Abdominal hysterectomy; Tubal ligation; Breast surgery; Cholecystectomy; Total knee arthroplasty (08/30/2011); Replacement total knee (Left); Esophagogastroduodenoscopy (egd) with propofol  (N/A, 07/25/2018); biopsy (07/25/2018);  Breast excisional biopsy (Left, 1998); Breast lumpectomy (Bilateral); and Joint replacement (2006, 2013). Social History:   reports that she has never smoked. She has never used smokeless tobacco. She reports that she does not drink alcohol  and does not use drugs. Family History:  family history includes Arthritis in her maternal grandmother; Breast cancer (age of onset: 5) in her maternal aunt; Breast cancer (age of onset: 72) in her mother; Cancer in her mother and another family member; Cancer (age of onset: 37) in her paternal aunt; Cancer (age of onset: 22) in her paternal grandmother; Depression in her mother; Heart attack in her father; Heart disease in her father; Hypertension in her brother, mother, sister, sister, and another family member; Kidney disease in her brother; Other in her father; Other (age of onset: 37) in her maternal grandmother; Stroke in her mother; Sudden death in her father. Allergies:  is allergic to iodine, iohexol , latex, penicillins, celecoxib , lactose, other, penicillin g, and sulfa  antibiotics.   Medication Reconciliation: Current Outpatient Medications on File Prior to Visit  Medication Sig   benzonatate  (TESSALON ) 200 MG capsule Take 1 capsule (200 mg total) by mouth 3 (three) times daily as needed for up to 10 days for cough.   busPIRone  (BUSPAR ) 10 MG tablet TAKE 1 TABLET TWICE A DAY   celecoxib  (CELEBREX ) 50 MG capsule TAKE 1 CAPSULE TWICE DAILY.   famotidine -calcium  carbonate-magnesium  hydroxide (PEPCID  COMPLETE)  [phone] MG chewable tablet CHEW 1 TABLET 2 TIMES DAILYAS NEEDED.   hydroxychloroquine (PLAQUENIL) 200 MG tablet Take 200 mg by mouth 2 (two) times daily.   lidocaine  (XYLOCAINE ) 2 % solution Use as directed 15 mLs in the mouth or throat every 3 (three) hours as needed for mouth pain (Throat pain; gargle; may spit or swallow).   losartan -hydrochlorothiazide  (HYZAAR) 50-12.5 MG tablet Take 1 tablet by mouth daily.   metoprolol  succinate (TOPROL -XL) 50 MG 24 hr tablet Take 1 tablet (50 mg total) by mouth daily.   Multiple Vitamins-Minerals (EMERGEN-C IMMUNE PLUS PO) Take 1 tablet by mouth daily.   nitrofurantoin , macrocrystal-monohydrate, (MACROBID ) 100 MG capsule Take 1 capsule (100 mg total) by mouth 2 (two) times daily.   potassium chloride  (KLOR-CON ) 10 MEQ tablet TAKE 1 TABLET BY MOUTH EVERY DAY (Patient not taking: Reported on 03/07/2024)   No current facility-administered medications on file prior to visit.  There are no discontinued medications.   Physical Exam:    03/07/2024   10:27 AM 02/07/2024    3:25 PM 02/07/2024    2:58 PM  Vitals with BMI  Height 5' 7  5' 7  Weight 183 lbs 10 oz  183 lbs  BMI 28.75  28.66  Systolic 150 138 098  Diastolic 86 72 76  Pulse 77  62  Vital signs reviewed.  Nursing notes reviewed. Weight trend reviewed. Physical Exam General Appearance:  No acute distress appreciable.   Well-groomed, healthy-appearing female.  Well proportioned with no abnormal fat distribution.  Good muscle tone. Pulmonary:  Normal work of breathing at rest, no respiratory distress apparent. SpO2: 98 %  Musculoskeletal: All extremities are intact.  Neurological:  Awake, alert, oriented, and engaged.  No obvious focal neurological deficits or cognitive impairments.  Sensorium seems unclouded.   Speech is clear and coherent with logical content. Psychiatric:  Appropriate mood, pleasant and cooperative demeanor, thoughtful and engaged during the exam Physical Exam HEENT: Nasal  mucosa erythematous with discharge. Throat erythematous with bruise on top of throat. SKIN: Rash on neck and back. Left peritonsillar redness  near tonsillectomy scar. Photographs Taken 03/07/2024 :       Results Negative flu/COVID swabs Point of Care (POC)     Assessment & Plan Chronic maxillary sinusitis She presents with an 8-day history of productive cough with yellow and brown sputum, low-grade fever, throat discomfort, nausea, dizziness, sinus congestion, malaise, and chest discomfort. Symptoms suggest a bacterial upper respiratory tract infection, possibly a tonsillar abscess or bacterial sinusitis, given pus and redness in the nasal and tonsillar areas. Differential includes COVID-19, but no testing was discussed. Prescribe azithromycin  (Z-Pak) for suspected bacterial infection. Instruct on saline nasal rinses 10 times daily while leaning 45 degrees forward to clear nasal discharge. Prescribe Flonase once daily after nasal rinses to reduce nasal swelling. Prescribe Claritin and Sudafed to aid drainage and reduce nasal swelling. Recommend over-the-counter Robitussin for cough management and offer prescription cough syrup if needed. Acute cough Negative flu/COVID swab Most likely diagnosis sinus infection  Dermatitis She reports a rash on the neck and back, consistent with lupus. The rash is not severe and has improved with topical hydrocortisone and antibiotic cream. Continue using hydrocortisone cream for rash management. No further treatment is planned at this time.  A follow-up appointment is scheduled for Friday to assess her condition. Advise to keep the appointment unless symptoms significantly improve.        Orders Placed in Encounter:   Lab Orders         POC COVID-19 BinaxNow         POCT Influenza A/B     Results negative.     This document was synthesized by artificial intelligence ([employer]bridge) using HIPAA-compliant recording of the clinical interaction;   We  discussed the use of AI scribe software for clinical note transcription with the patient, who gave verbal consent to proceed. additional Info: This encounter employed state-of-the-art, real-time, collaborative documentation. The patient actively reviewed and assisted in updating their electronic medical record on a shared screen, ensuring transparency and facilitating joint problem-solving for the problem list, overview, and plan. This approach promotes accurate, informed care. The treatment plan was discussed and reviewed in detail, including medication safety, potential side effects, and all patient questions. We confirmed understanding and comfort with the plan. Follow-up instructions were established, including contacting the office for any concerns, returning if symptoms worsen, persist, or new symptoms develop, and precautions for potential emergency department visits.

## 2024-03-07 NOTE — Patient Instructions (Addendum)
 SINUS INFECTION  MANAGEMENT PLAN   Sinus saline sprays- use nightly, and after sneezing episodes or exposure to allergen.  Insert deeply and spray mist into nose while leaning over sink at 45 degrees,  while gently breathing. Also blow out onto tissue while leaning forward 45 degrees. Once daily, after a sinus rinse, use sensimist.  Just before bedtime is best. This only needed if allergies acting up.  If this is inadequate add-on once daily for levocetirizine / xyzal 5 mg for nondrowsy antihistamine Take benadryl  25 mg at bedtime also if allergic mucus is persisting  When allergies cause chronic swelling in sinuses, it leads to sinus infections:    DAILY TREATMENT ROUTINE   Time of Day Treatment Steps  Morning 1. Saline Nasal Spray - Use to cleanse nasal passages 2. Xyzal (levocetirizine) - Take one tablet daily   Throughout Day Saline Nasal Spray - Use 2 additional times (mid-day and afternoon)   Evening/Bedtime 1. Saline Nasal Rinse - Thoroughly clean nasal passages 2. Flonase Sensimist - Apply after nasal rinse 3. Benadryl  (diphenhydramine ) - Take 25mg  if experiencing persistent congestion    PROPER TECHNIQUE GUIDE       Saline Nasal Spray/Rinse Technique: Lean forward over sink at a 45-degree angle Turn head slightly to one side Insert spray tip into upper nostril Spray gently while breathing lightly through your nose Repeat on other side Gently blow nose to clear excess solution Use saline spray 3 times daily to keep nasal passages moist and clear allergens.       Flonase Sensimist Technique: Shake bottle gently before each use Prime the bottle if it's new or hasn't been used for a week Tilt your head forward slightly Insert tip into nostril, pointing away from the center of your nose Spray while inhaling gently Repeat in other nostril Use Flonase Sensimist once daily, preferably at bedtime after using saline rinse. It may take several days of regular use to feel  maximum benefit.   WHY FLONASE SENSIMIST?   Benefits of Flonase Sensimist:  Alcohol -free and scent-free formula - gentler on sensitive nasal passages Fine mist application - more comfortable with less dripping down throat Effectively relieves nasal congestion, sneezing, runny nose, and even eye symptoms 24-hour relief with once-daily dosing Uses a more potent form of fluticasone that works at a lower dose Less liquid per spray means less discomfort  UNDERSTANDING YOUR MEDICATIONS   Medication How It Works Important Notes  Flonase Sensimist (fluticasone furoate) Reduces inflammation in nasal passages, addressing the underlying cause of allergy symptoms - Takes several days for full effect - Use daily for best results - Safe for long-term use   Xyzal (levocetirizine) Blocks histamine to reduce allergy symptoms like sneezing and itching - Take at the same time each day - May cause drowsiness in some people - Once-daily dosing   Benadryl  (diphenhydramine ) Antihistamine that provides additional relief for breakthrough symptoms - Causes drowsiness - Use only at bedtime - For occasional use when needed   Saline Spray/Rinse Physically removes allergens and moistens nasal passages - Safe to use frequently - Improves effectiveness of other treatments - Reduces nasal irritation    CONTACT YOUR PROVIDER IF: Your symptoms do not improve after 1-2 weeks of following this plan You develop sinus pain with fever or green/yellow discharge You experience frequent nosebleeds You develop new or worsening symptoms You have questions about your treatment plan     ADDITIONAL ALLERGY MANAGEMENT TIPS   HELPFUL STRATEGIES: ?? Keep windows closed during high  pollen seasons ??? Use allergen-proof covers for pillows and mattresses ?? Vacuum regularly with a HEPA filter vacuum ?? Shower and change clothes after spending time outdoors ?? Check local pollen counts and limit outdoor time when counts are  high ?? Stay well-hydrated to help keep mucous membranes moist

## 2024-03-07 NOTE — Addendum Note (Signed)
 Addended by: Edan Serratore G on: 03/07/2024 11:41 AM   Modules accepted: Level of Service

## 2024-03-09 ENCOUNTER — Ambulatory Visit (INDEPENDENT_AMBULATORY_CARE_PROVIDER_SITE_OTHER): Admitting: Internal Medicine

## 2024-03-09 VITALS — BP 136/76 | HR 62 | Temp 98.3°F | Ht 67.0 in | Wt 183.3 lb

## 2024-03-09 DIAGNOSIS — E876 Hypokalemia: Secondary | ICD-10-CM | POA: Diagnosis not present

## 2024-03-09 DIAGNOSIS — D709 Neutropenia, unspecified: Secondary | ICD-10-CM | POA: Diagnosis not present

## 2024-03-09 DIAGNOSIS — J320 Chronic maxillary sinusitis: Secondary | ICD-10-CM | POA: Diagnosis not present

## 2024-03-09 DIAGNOSIS — Z0000 Encounter for general adult medical examination without abnormal findings: Secondary | ICD-10-CM

## 2024-03-09 DIAGNOSIS — I10 Essential (primary) hypertension: Secondary | ICD-10-CM | POA: Diagnosis not present

## 2024-03-09 DIAGNOSIS — F418 Other specified anxiety disorders: Secondary | ICD-10-CM | POA: Diagnosis not present

## 2024-03-09 DIAGNOSIS — R809 Proteinuria, unspecified: Secondary | ICD-10-CM

## 2024-03-09 MED ORDER — BUSPIRONE HCL 15 MG PO TABS: 15.0000 mg | ORAL_TABLET | Freq: Two times a day (BID) | ORAL | 3 refills | Status: DC

## 2024-03-09 NOTE — Progress Notes (Unsigned)
 ==============================  [facility]: [phone]   -- Medical Office Visit --  Patient: [name]      Age: 73 y.o.       Sex:  female  Date:   03/09/2024 Today's Healthcare Provider: Anthon Kins, MD  ==============================   Chief Complaint: Hypertension   Discussed the use of AI scribe software for clinical note transcription with the patient, who gave verbal consent to proceed.  History of Present Illness   BP Readings from Last 30 Encounters:  03/09/24 136/76  03/07/24 (!) 150/86  02/07/24 138/72  01/05/24 118/63  10/31/23 (!) 163/79  09/28/23 (!) 146/82  08/12/23 124/78  07/22/23 (!) 169/75  06/30/23 (!) 162/88  06/07/23 134/76  05/10/23 120/70  05/05/23 134/72  02/07/23 122/70  01/12/23 127/75  01/04/23 (!) 142/76  12/29/22 128/68  12/15/22 131/75  11/30/22 106/62  11/22/22 124/60  11/01/22 130/88  09/08/22 139/64  08/16/22 (!) 152/74  06/29/22 131/77  06/05/22 121/63  04/12/22 140/78  01/11/22 (!) 142/68  01/03/22 (!) 159/78  12/28/21 (!) 167/95  11/16/21 (!) 141/78  11/10/21 (!) 144/75   Lab Results  Component Value Date/Time   K 3.5 01/05/2024 02:59 PM   K 3.5 07/22/2023 04:09 PM   K 3.7 06/30/2023 04:03 PM   K 4.0 05/05/2023 11:47 AM   K 3.9 02/07/2023 10:54 AM   K 4.0 09/08/2022 12:37 PM   K 4.0 07/14/2022 01:38 PM   K 3.4 (L) 01/26/2017 03:08 PM   K 3.8 07/19/2016 11:04 AM   K 3.2 (L) 01/26/2016 12:25 PM   K 3.3 (L) 07/21/2015 11:34 AM   K 3.7 01/27/2015 08:31 AM   K 3.9 08/26/2014 08:47 AM   K 3.9 02/25/2014 11:07 AM        {{No specialty comments available.:1}{ Problem List as of 03/09/2024 Reviewed: 03/09/2024  1:37 PM by Jaclyn Kins, MD    Weight disorder   Last Assessment & Plan 01/11/2022 Office Visit Written 01/11/2022  8:42 AM by Jaclyn Sos, MD  [name] is done a great job of losing weight.  Her BMI has now decreased to 29.  She will keep  working with healthy weight and wellness clinic and exercising. Wt Readings from Last 10 Encounters:  03/09/24 183 lb 4.8 oz (83.1 kg)  03/07/24 183 lb 9.6 oz (83.3 kg)  02/07/24 183 lb (83 kg)  01/24/24 180 lb (81.6 kg)  01/05/24 180 lb 4.8 oz (81.8 kg)  10/31/23 181 lb 3.2 oz (82.2 kg)  09/28/23 183 lb (83 kg)  08/12/23 183 lb 4.8 oz (83.1 kg)  07/22/23 175 lb (79.4 kg)  06/30/23 184 lb 3.2 oz (83.6 kg)           Rheumatoid arthritis [facility])   Last Assessment & Plan 10/31/2023 Office Visit Written 10/31/2023  7:16 PM by Jaclyn Kins, MD  Discussed need to limit Celebrex  due to hiatal hernia and chronic kidney disease Continue(s) with hydroxychloroquine and rheumatologist follow up       Primary localized osteoarthrosis of multiple sites   Personal history of colon cancer   Last Assessment & Plan 10/31/2023 Office Visit Written 10/31/2023  7:16 PM by Jaclyn Kins, MD  Colonoscopy 2024 reviewed, no plan follow up for 5 years       Other specified anxiety disorders   Last Assessment & Plan 10/31/2023 Office Visit Written 10/31/2023  7:16 PM by Jaclyn Kins, MD  Continue(s) with buspirone   Other microscopic hematuria   Last Assessment & Plan 10/31/2023 Office Visit Written 10/31/2023  7:16 PM by Jaclyn Kins, MD  Hematuria Intermittent microscopic hematuria likely from occasional kidney stones. Malignancy is less likely due to partial hysterectomy and absence of other symptoms. Obtain a urine sample for cytology and discuss findings with an oncologist during the next visit. Consider gynecological evaluation based on results.      NSVT (nonsustained ventricular tachycardia) [facility])   Last Assessment & Plan 10/31/2023 Office Visit Written 10/31/2023  7:16 PM by Jaclyn Kins, MD  Encouraged patient to continue(s) with cardiology follow up       Malignant neoplasm of upper-outer quadrant of right breast in female, estrogen receptor positive (HCC)   LVH (left  ventricular hypertrophy) due to hypertensive disease, without heart failure   Last Assessment & Plan 08/16/2022 Office Visit Written 08/16/2022  9:44 AM by Jaclyn Sos, MD  Blood pressure controlled as above.  She has no heart failure symptoms.      Lupus   Last Assessment & Plan 09/04/2020 Office Visit Written 09/04/2020 10:19 AM by Jaclyn Hoe, PA-C  Followed by Dr [name]      Hyperlipidemia with target LDL less than 130   Last Assessment & Plan 10/31/2023 Office Visit Written 10/31/2023  7:16 PM by Jaclyn Kins, MD  Atherosclerosis Mild atherosclerosis noted. Discuss potential benefits and risks of starting aspirin  and cholesterol medication with a cardiologist. Patient prefers to avoid anti-cholesterol medication due to side effects. Consider ultrasound of the neck to assess for atherosclerosis. Medications: not yet discussed Lab Results  Component Value Date   HDL 57 06/29/2021   HDL 61.30 12/30/2020   HDL 55 11/24/2007   CHOLHDL 3 12/30/2020   CHOLHDL 2.6 Ratio 11/24/2007   Lab Results  Component Value Date   LDLCALC 80 06/29/2021   LDLCALC 76 12/30/2020   LDLCALC 71 11/24/2007   Lab Results  Component Value Date   TRIG 83 06/29/2021   TRIG 109.0 12/30/2020   TRIG 86 11/24/2007   Lab Results  Component Value Date   CHOL 153 06/29/2021   CHOL 159 12/30/2020   CHOL 143 11/24/2007   The 10-year ASCVD risk score (Arnett DK, et al., 2019) is: 29.1%   Values used to calculate the score:     Age: 52 years     Clincally relevant sex: Female     Is Non-Hispanic African American: Yes     Diabetic: Yes     Tobacco smoker: No     Systolic Blood Pressure: 136 mmHg     Is BP treated: Yes     HDL Cholesterol: 57 mg/dL     Total Cholesterol: 153 mg/dL Lab Results  Component Value Date   ALT 13 01/05/2024   AST 20 01/05/2024   ALKPHOS 85 01/05/2024   TSH 0.89 05/05/2023   HGBA1C 5.4 05/05/2023   Body mass index is 28.71 kg/m.  Lipoprotein(a),  Apolipoprotein B (ApoB), and High-sensitivity C-reactive protein (hs-CRP) No results found for: HSCRP, LIPOA Improving Your Cholesterol:  Smoking Cessation: Quitting smoking improves cholesterol levels.       HTN (hypertension)   Last Assessment & Plan 10/31/2023 Office Visit Written 10/31/2023  7:16 PM by Jaclyn Kins, MD  Blood pressure remains uncontrolled, possibly due to white coat hypertension. Adjust medication to losartan  50 mg/hydrochlorothiazide  12.5 mg for better control. Upload blood pressure readings daily for one week. Follow up in one month for in-person evaluation. Need more home  blood pressure readings to make more significant changes.      History of breast cancer   Last Assessment & Plan 09/04/2020 Office Visit Written 09/04/2020 10:18 AM by Jaclyn Hoe, PA-C  Biopsy and Tamoxifen  1998, recurrent in Rt breast 2009- treated with Tamoxifen  and radiation. Followed by Dr Charolett Copes      Hiatal hernia   Last Assessment & Plan 10/31/2023 Office Visit Written 10/31/2023  7:16 PM by Jaclyn Kins, MD  Discussed importance of informing us  if gastritis symptom(s) return- may need to consider surgical repair. Discussed valsalva risks.      Hand arthritis   Last Assessment & Plan 09/28/2023 Office Visit Written 09/28/2023  7:07 PM by Jaclyn Kins, MD  Chronic hand osteoarthritis presents with worsening pain, rated at 9/10 in severity, exacerbated by cold weather. Previous treatments include Voltaren and Tylenol , with orthopedic and rheumatology consultations noting spur formation. Despite the risk of stomach bleeding associated with Celebrex , she prefers it, acknowledging the need for Pepcid  Complete for stomach protection. We will order bilateral hand x-rays, prescribe Celebrex  50 mg alongside Pepcid  Complete, and continue Voltaren gel.       GERD (gastroesophageal reflux disease)   Last Assessment & Plan 09/28/2023 Office Visit Written 09/28/2023  7:07 PM by Jaclyn Kins, MD  Gastroesophageal Reflux Disease (GERD)   Her chronic GERD is managed with Prilosec. We will continue Prilosec.      Family history of coronary artery disease in father   Last Assessment & Plan 09/04/2020 Office Visit Written 09/04/2020 10:18 AM by Jaclyn Hoe, PA-C  Father had an MI in his 48's      Diverticular disease   Last Assessment & Plan 09/08/2022 Office Visit Written 10/31/2023 11:48 AM by Jaclyn Kins, MD   >>ASSESSMENT AND PLAN FOR DIVERTICULOSIS, COLON WRITTEN ON 09/08/2022 10:41 AM BY Sharia Averitt G, MD  Encouraged her to start using her fiber gummies      Difficulty standing   Last Assessment & Plan 10/31/2023 Office Visit Written 10/31/2023  7:16 PM by Jaclyn Kins, MD  Osteoarthritis and Rheumatoid Arthritis Left knee and thoracic spinal pain cause difficulty standing without using hands, likely due to knee and hip pain. Completed physical therapy but still requires hands to stand, which may strain wrists. Encourage techniques to reduce wrist strain when standing. Continue monitoring and managing pain as needed.      Diastolic dysfunction   Last Assessment & Plan 06/07/2023 Office Visit Written 06/07/2023  7:31 PM by Jaclyn Kins, MD  She shows no symptoms of fluid overload, and Hydrochlorothiazide  is effectively controlling any potential fluid accumulation. We will continue Hydrochlorothiazide  as currently prescribed.      DDD (degenerative disc disease), thoracic   Chronic pain of left knee   Last Assessment & Plan 06/07/2023 Office Visit Written 06/07/2023  7:31 PM by Jaclyn Kins, MD  She will discuss with her orthopedist      Chronic kidney disease, stage 2 (mild)   Last Assessment & Plan 10/31/2023 Office Visit Written 10/31/2023  7:16 PM by Jaclyn Kins, MD  Chronic Kidney Disease Stage 2 Stage 2 chronic kidney disease is standard for age. Avoid Celebrex  due to nephrotoxic effects, especially when dehydrated or kidneys are under  strain. Use Celebrex  only if pain from lupus and rheumatoid arthritis flares up.      Chronic idiopathic constipation   Aortic atherosclerosis [facility])   Last Assessment & Plan 10/31/2023 Office Visit Written 10/31/2023  7:16 PM by Jaclyn Kins, MD  Atherosclerosis Mild atherosclerosis noted. Discuss potential benefits and risks of starting aspirin  and cholesterol medication with a cardiologist. Patient prefers to avoid anti-cholesterol medication due to side effects. Consider ultrasound of the neck to assess for atherosclerosis.      Adjustment disorder   Last Assessment & Plan 06/07/2023 Office Visit Written 06/07/2023  7:31 PM by Jaclyn Kins, MD  Her anxiety is not well-controlled with the current Buspirone  regimen, likely due to stress related to her sister's dementia. We will increase the Buspirone  dosage and monitor for improvement in anxiety symptoms.      Abnormal finding on imaging of liver   Last Assessment & Plan 10/31/2023 Office Visit Written 10/31/2023  7:16 PM by Jaclyn Kins, MD  Updated problem overview for this problem to improve longitudinal management. No plans for follow up at this time but MRI might be considered to further characterize in future.     :1}}  Updated Problem List Entries: No problems updated.  Background Reviewed: Problem List: has Malignant neoplasm of upper-outer quadrant of right breast in female, estrogen receptor positive (HCC); HTN (hypertension); Hiatal hernia; Lupus; Personal history of colon cancer; History of breast cancer; Family history of coronary artery disease in father; Hyperlipidemia with target LDL less than 130; LVH (left ventricular hypertrophy) due to hypertensive disease, without heart failure; Weight disorder; Primary localized osteoarthrosis of multiple sites; Rheumatoid arthritis (HCC); GERD (gastroesophageal reflux disease); Difficulty standing; Hand arthritis; Aortic atherosclerosis (HCC); Diverticular disease; Chronic  idiopathic constipation; DDD (degenerative disc disease), thoracic; Abnormal finding on imaging of liver; Chronic kidney disease, stage 2 (mild); Diastolic dysfunction; NSVT (nonsustained ventricular tachycardia) (HCC); Adjustment disorder; Chronic pain of left knee; Other specified anxiety disorders; and Other microscopic hematuria on their problem list. Past Medical History:  has a past medical history of Acute pain of right shoulder (11/01/2022), Allergy (1967), Anxiety, Aortic atherosclerosis (HCC) (11/01/2022), Arthritis, Back pain, Blood transfusion, Blood transfusion without reported diagnosis, Breast cancer (HCC) (2009), Cataract (12/2023), Chicken pox, Colon cancer (HCC) (2003), Depression, Difficulty standing (09/08/2022), Diverticular disease (11/01/2022), Diverticulitis, Edema of both ankles, Elevated amylase (11/22/2022), Family history of breast cancer (10/24/2017), Flank pain (11/03/2007), Gallbladder problem, Gastric wall thickening (12/15/2022), Gastritis, Generalized abdominal pain (12/15/2022), GERD (gastroesophageal reflux disease), Heart murmur (1954), Hepatitis, Hiatal hernia, History of stomach ulcers, Hypertension, Insomnia (02/17/2021), Intractable abdominal pain (12/15/2022), Joint pain, Lactose intolerance, Lower extremity edema (01/11/2022), Lupus, Multiple gastric ulcers, Palpitations, Personal history of radiation therapy (2009), Prediabetes (09/08/2022), Rheumatic fever, Rheumatoid arthritis (HCC), SLE (systemic lupus erythematosus related syndrome) (HCC), Upper abdominal pain (11/01/2022), Urinary incontinence, and UTI (urinary tract infection). Past Surgical History:   has a past surgical history that includes Tonsillectomy; Appendectomy; Abdominal hysterectomy; Tubal ligation; Breast surgery; Cholecystectomy; Total knee arthroplasty (08/30/2011); Replacement total knee (Left); Esophagogastroduodenoscopy (egd) with propofol  (N/A, 07/25/2018); biopsy (07/25/2018); Breast excisional  biopsy (Left, 1998); Breast lumpectomy (Bilateral); and Joint replacement (2006, 2013). Social History:   reports that she has never smoked. She has never used smokeless tobacco. She reports that she does not drink alcohol  and does not use drugs. Family History:  family history includes Arthritis in her maternal grandmother; Breast cancer (age of onset: 53) in her maternal aunt; Breast cancer (age of onset: 25) in her mother; Cancer in her mother and another family member; Cancer (age of onset: 48) in her paternal aunt; Cancer (age of onset: 42) in her paternal grandmother; Depression in her mother; Heart attack in her father; Heart disease in  her father; Hypertension in her brother, mother, sister, sister, and another family member; Kidney disease in her brother; Other in her father; Other (age of onset: 23) in her maternal grandmother; Stroke in her mother; Sudden death in her father. Allergies:  is allergic to iodine, iohexol , latex, penicillins, celecoxib , lactose, other, penicillin g, and sulfa  antibiotics.   Medication Reconciliation: Current Outpatient Medications on File Prior to Visit  Medication Sig   azithromycin  (ZITHROMAX ) 250 MG tablet Take 2 tablets on day 1, then 1 tablet daily on days 2 through 5   busPIRone  (BUSPAR ) 10 MG tablet TAKE 1 TABLET TWICE A DAY   celecoxib  (CELEBREX ) 50 MG capsule TAKE 1 CAPSULE TWICE DAILY.   famotidine -calcium  carbonate-magnesium  hydroxide (PEPCID  COMPLETE) 10-800-165 MG chewable tablet CHEW 1 TABLET 2 TIMES DAILYAS NEEDED.   fluticasone (FLONASE) 50 MCG/ACT nasal spray Place 2 sprays into both nostrils daily.   hydroxychloroquine (PLAQUENIL) 200 MG tablet Take 200 mg by mouth 2 (two) times daily.   lidocaine  (XYLOCAINE ) 2 % solution Use as directed 15 mLs in the mouth or throat every 3 (three) hours as needed for mouth pain (Throat pain; gargle; may spit or swallow).   loratadine (CLARITIN) 10 MG tablet Take 1 tablet (10 mg total) by mouth daily.    losartan -hydrochlorothiazide  (HYZAAR) 50-12.5 MG tablet Take 1 tablet by mouth daily.   metoprolol  succinate (TOPROL -XL) 50 MG 24 hr tablet Take 1 tablet (50 mg total) by mouth daily.   Multiple Vitamins-Minerals (EMERGEN-C IMMUNE PLUS PO) Take 1 tablet by mouth daily.   nitrofurantoin , macrocrystal-monohydrate, (MACROBID ) 100 MG capsule Take 1 capsule (100 mg total) by mouth 2 (two) times daily.   Saline (SIMPLY SALINE) 0.9 % AERS Place 2 each into the nose as directed. Use nightly for sinus hygiene long-term.  Can also be used as many times daily as desired to assist with clearing congested sinuses.   benzonatate  (TESSALON ) 200 MG capsule Take 1 capsule (200 mg total) by mouth 3 (three) times daily as needed for up to 10 days for cough. (Patient not taking: Reported on 03/09/2024)   potassium chloride  (KLOR-CON ) 10 MEQ tablet TAKE 1 TABLET BY MOUTH EVERY DAY (Patient not taking: Reported on 03/07/2024)   promethazine -dextromethorphan (PROMETHAZINE -DM) 6.25-15 MG/5ML syrup Take 5 mLs by mouth 4 (four) times daily as needed for cough.   pseudoephedrine (SUDAFED 12 HOUR) 120 MG 12 hr tablet Take 1 tablet (120 mg total) by mouth 2 (two) times daily. (Patient not taking: Reported on 03/09/2024)   No current facility-administered medications on file prior to visit.  There are no discontinued medications.   Physical Exam:    03/09/2024    1:02 PM 03/07/2024   10:27 AM 02/07/2024    3:25 PM  Vitals with BMI  Height 5' 7 5' 7   Weight 183 lbs 5 oz 183 lbs 10 oz   BMI 28.7 28.75   Systolic 136 150 283  Diastolic 76 86 72  Pulse 62 77   Vital signs reviewed.  Nursing notes reviewed. Weight trend reviewed. Physical Exam General Appearance:  No acute distress appreciable.   Well-groomed, healthy-appearing female.  Well proportioned with no abnormal fat distribution.  Good muscle tone. Pulmonary:  Normal work of breathing at rest, no respiratory distress apparent. SpO2: 98 %  Musculoskeletal: All  extremities are intact.  Neurological:  Awake, alert, oriented, and engaged.  No obvious focal neurological deficits or cognitive impairments.  Sensorium seems unclouded.   Speech is clear and coherent with logical content.  Psychiatric:  Appropriate mood, pleasant and cooperative demeanor, thoughtful and engaged during the exam Physical Exam  ***  Results:    02/07/2024    3:01 PM 01/24/2024    8:15 AM 10/31/2023   10:43 AM 06/07/2023   10:52 AM  PHQ 2/9 Scores  PHQ - 2 Score 0 0 0 0  PHQ- 9 Score 0   3   Results   {Insert previous labs (optional):23779} {See past labs  Heme  Chem  Endocrine  Serology  Results Review (optional):1} No results found for any visits on 03/09/24. Office Visit on 03/07/2024  Component Date Value Ref Range Status   SARS Coronavirus 2 Ag 03/07/2024 Negative  Negative Final   Influenza A, POC 03/07/2024 Negative  Negative Final   Influenza B, POC 03/07/2024 Negative  Negative Final  Office Visit on 02/07/2024  Component Date Value Ref Range Status   Color, UA 02/07/2024 YELLOW   Final   Clarity, UA 02/07/2024 CLEAR   Final   Glucose, UA 02/07/2024 Negative  Negative Final   Bilirubin, UA 02/07/2024 NEG   Final   Ketones, UA 02/07/2024 NEG   Final   Spec Grav, UA 02/07/2024 1.020  1.010 - 1.025 Final   Blood, UA 02/07/2024 NEG   Final   pH, UA 02/07/2024 6.0  5.0 - 8.0 Final   Protein, UA 02/07/2024 Negative  Negative Final   Urobilinogen, UA 02/07/2024 0.2  0.2 or 1.0 E.U./dL Final   Nitrite, UA 29/56/2130 NEG   Final   Leukocytes, UA 02/07/2024 Negative  Negative Final   MICRO NUMBER: 02/07/2024 [id number]   Final   SPECIMEN QUALITY: 02/07/2024 Adequate   Final   Sample Source 02/07/2024 URINE   Final   STATUS: 02/07/2024 FINAL   Final   Result: 02/07/2024 Less than 10,000 CFU/mL of single Gram positive organism isolated. No further testing will be performed. If clinically indicated, recollection using a method to minimize contamination, with  prompt transfer to Urine Culture Transport Tube, is recommended.   Final  Appointment on 01/05/2024  Component Date Value Ref Range Status   Sodium 01/05/2024 140  135 - 145 mmol/L Final   Potassium 01/05/2024 3.5  3.5 - 5.1 mmol/L Final   Chloride 01/05/2024 105  98 - 111 mmol/L Final   CO2 01/05/2024 30  22 - 32 mmol/L Final   Glucose, Bld 01/05/2024 83  70 - 99 mg/dL Final   BUN 62/95/2841 15  8 - 23 mg/dL Final   Creatinine 32/44/0102 0.82  0.44 - 1.00 mg/dL Final   Calcium  01/05/2024 9.6  8.9 - 10.3 mg/dL Final   Total Protein 72/53/6644 7.2  6.5 - 8.1 g/dL Final   Albumin  01/05/2024 4.1  3.5 - 5.0 g/dL Final   AST 03/47/4259 20  15 - 41 U/L Final   ALT 01/05/2024 13  0 - 44 U/L Final   Alkaline Phosphatase 01/05/2024 85  38 - 126 U/L Final   Total Bilirubin 01/05/2024 0.5  0.0 - 1.2 mg/dL Final   GFR, Estimated 01/05/2024 >60  >60 mL/min Final   Anion gap 01/05/2024 5  5 - 15 Final   Specimen Description 01/05/2024    Final                   Value:URINE, CLEAN CATCH Performed at [facility], [address] [place], [place] [address]    Special Requests 01/05/2024    Final  Value:NONE Performed at [facility], [address], [place], [place] [address]    Culture 01/05/2024    Final                   Value:NO GROWTH Performed at [facility], [address], [place], [place] [address]    Report Status 01/05/2024 01/06/2024 FINAL   Final   Color, Urine 01/05/2024 YELLOW  YELLOW Final   APPearance 01/05/2024 CLEAR  CLEAR Final   Specific Gravity, Urine 01/05/2024 1.021  1.005 - 1.030 Final   pH 01/05/2024 5.0  5.0 - 8.0 Final   Glucose, UA 01/05/2024 NEGATIVE  NEGATIVE mg/dL Final   Hgb urine dipstick 01/05/2024 NEGATIVE  NEGATIVE Final   Bilirubin Urine 01/05/2024 NEGATIVE  NEGATIVE Final   Ketones, ur 01/05/2024 NEGATIVE  NEGATIVE mg/dL Final   Protein, ur 29/56/2130 30 (A)  NEGATIVE mg/dL Final    Nitrite 86/57/8469 NEGATIVE  NEGATIVE Final   Leukocytes,Ua 01/05/2024 NEGATIVE  NEGATIVE Final   RBC / HPF 01/05/2024 0-5  0 - 5 RBC/hpf Final   WBC, UA 01/05/2024 0-5  0 - 5 WBC/hpf Final   Bacteria, UA 01/05/2024 NONE SEEN  NONE SEEN Final   Squamous Epithelial / HPF 01/05/2024 0-5  0 - 5 /HPF Final   Mucus 01/05/2024 PRESENT   Final   Hyaline Casts, UA 01/05/2024 PRESENT   Final   WBC 01/05/2024 3.5 (L)  4.0 - 10.5 K/uL Final   RBC 01/05/2024 3.89  3.87 - 5.11 MIL/uL Final   Hemoglobin 01/05/2024 12.3  12.0 - 15.0 g/dL Final   HCT 62/95/2841 35.8 (L)  36.0 - 46.0 % Final   MCV 01/05/2024 92.0  80.0 - 100.0 fL Final   MCH 01/05/2024 31.6  26.0 - 34.0 pg Final   MCHC 01/05/2024 34.4  30.0 - 36.0 g/dL Final   RDW 32/44/0102 12.6  11.5 - 15.5 % Final   Platelets 01/05/2024 155  150 - 400 K/uL Final   nRBC 01/05/2024 0.0  0.0 - 0.2 % Final   Neutrophils Relative % 01/05/2024 23  % Final   Neutro Abs 01/05/2024 0.8 (L)  1.7 - 7.7 K/uL Final   Lymphocytes Relative 01/05/2024 54  % Final   Lymphs Abs 01/05/2024 1.9  0.7 - 4.0 K/uL Final   Monocytes Relative 01/05/2024 19  % Final   Monocytes Absolute 01/05/2024 0.7  0.1 - 1.0 K/uL Final   Eosinophils Relative 01/05/2024 3  % Final   Eosinophils Absolute 01/05/2024 0.1  0.0 - 0.5 K/uL Final   Basophils Relative 01/05/2024 1  % Final   Basophils Absolute 01/05/2024 0.0  0.0 - 0.1 K/uL Final   Immature Granulocytes 01/05/2024 0  % Final   Abs Immature Granulocytes 01/05/2024 0.01  0.00 - 0.07 K/uL Final  Office Visit on 10/31/2023  Component Date Value Ref Range Status   Color, Urine 10/31/2023 YELLOW  YELLOW Final   APPearance 10/31/2023 CLEAR  CLEAR Final   Specific Gravity, Urine 10/31/2023 1.026  1.001 - 1.035 Final   pH 10/31/2023 6.0  5.0 - 8.0 Final   Glucose, UA 10/31/2023 NEGATIVE  NEGATIVE Final   Bilirubin Urine 10/31/2023 NEGATIVE  NEGATIVE Final   Ketones, ur 10/31/2023 NEGATIVE  NEGATIVE Final   Hgb urine dipstick  10/31/2023 NEGATIVE  NEGATIVE Final   Protein, ur 10/31/2023 TRACE (A)  NEGATIVE Final   Nitrites, Initial 10/31/2023 NEGATIVE  NEGATIVE Final   Leukocyte Esterase 10/31/2023 NEGATIVE  NEGATIVE Final  WBC, UA 10/31/2023 NONE SEEN  0 - 5 /HPF Final   RBC / HPF 10/31/2023 NONE SEEN  0 - 2 /HPF Final   Squamous Epithelial / HPF 10/31/2023 NONE SEEN  < OR = 5 /HPF Final   Bacteria, UA 10/31/2023 NONE SEEN  NONE SEEN /HPF Final   Hyaline Cast 10/31/2023 NONE SEEN  NONE SEEN /LPF Final   Note 10/31/2023    Final   Reflexve Urine Culture 10/31/2023    Final  Admission on 07/22/2023, Discharged on 07/22/2023  Component Date Value Ref Range Status   SARS Coronavirus 2 by RT PCR 07/22/2023 NEGATIVE  NEGATIVE Final   Influenza A by PCR 07/22/2023 NEGATIVE  NEGATIVE Final   Influenza B by PCR 07/22/2023 NEGATIVE  NEGATIVE Final   Resp Syncytial Virus by PCR 07/22/2023 NEGATIVE  NEGATIVE Final   Group A Strep by PCR 07/22/2023 NOT DETECTED  NOT DETECTED Final   Lactic Acid, Venous 07/22/2023 0.9  0.5 - 1.9 mmol/L Final   Sodium 07/22/2023 138  135 - 145 mmol/L Final   Potassium 07/22/2023 3.5  3.5 - 5.1 mmol/L Final   Chloride 07/22/2023 105  98 - 111 mmol/L Final   CO2 07/22/2023 26  22 - 32 mmol/L Final   Glucose, Bld 07/22/2023 93  70 - 99 mg/dL Final   BUN 56/38/7564 13  8 - 23 mg/dL Final   Creatinine, Ser 07/22/2023 0.66  0.44 - 1.00 mg/dL Final   Calcium  07/22/2023 10.0  8.9 - 10.3 mg/dL Final   Total Protein 33/29/5188 7.0  6.5 - 8.1 g/dL Final   Albumin  07/22/2023 3.9  3.5 - 5.0 g/dL Final   AST 41/66/0630 21  15 - 41 U/L Final   ALT 07/22/2023 13  0 - 44 U/L Final   Alkaline Phosphatase 07/22/2023 77  38 - 126 U/L Final   Total Bilirubin 07/22/2023 0.5  0.3 - 1.2 mg/dL Final   GFR, Estimated 07/22/2023 >60  >60 mL/min Final   Anion gap 07/22/2023 7  5 - 15 Final   WBC 07/22/2023 4.4  4.0 - 10.5 K/uL Final   RBC 07/22/2023 4.03  3.87 - 5.11 MIL/uL Final   Hemoglobin 07/22/2023 12.4   12.0 - 15.0 g/dL Final   HCT 16/09/930 37.0  36.0 - 46.0 % Final   MCV 07/22/2023 91.8  80.0 - 100.0 fL Final   MCH 07/22/2023 30.8  26.0 - 34.0 pg Final   MCHC 07/22/2023 33.5  30.0 - 36.0 g/dL Final   RDW 35/57/3220 12.5  11.5 - 15.5 % Final   Platelets 07/22/2023 182  150 - 400 K/uL Final   nRBC 07/22/2023 0.0  0.0 - 0.2 % Final   Neutrophils Relative % 07/22/2023 56  % Final   Neutro Abs 07/22/2023 2.5  1.7 - 7.7 K/uL Final   Lymphocytes Relative 07/22/2023 26  % Final   Lymphs Abs 07/22/2023 1.2  0.7 - 4.0 K/uL Final   Monocytes Relative 07/22/2023 15  % Final   Monocytes Absolute 07/22/2023 0.7  0.1 - 1.0 K/uL Final   Eosinophils Relative 07/22/2023 3  % Final   Eosinophils Absolute 07/22/2023 0.1  0.0 - 0.5 K/uL Final   Basophils Relative 07/22/2023 0  % Final   Basophils Absolute 07/22/2023 0.0  0.0 - 0.1 K/uL Final   Immature Granulocytes 07/22/2023 0  % Final   Abs Immature Granulocytes 07/22/2023 0.01  0.00 - 0.07 K/uL Final   Prothrombin Time 07/22/2023 13.5  11.4 - 15.2  seconds Final   INR 07/22/2023 1.0  0.8 - 1.2 Final   aPTT 07/22/2023 27  24 - 36 seconds Final   Specimen Description 07/22/2023    Final                   Value:BLOOD LEFT ANTECUBITAL Performed at [facility], [address] [place], [place] [address]    Special Requests 07/22/2023    Final                   Value:BOTTLES DRAWN AEROBIC AND ANAEROBIC Blood Culture adequate volume Performed at [facility], [address] [place], [place] [address]    Culture 07/22/2023    Final                   Value:NO GROWTH 5 DAYS Performed at [facility], [address] [place], [place] [address]    Report Status 07/22/2023 07/27/2023 FINAL   Final   Specimen Description 07/22/2023    Final                   Value:BLOOD BLOOD LEFT ARM Performed at [facility], [address] [place], [place] [address]    Special Requests 07/22/2023     Final                   Value:BOTTLES DRAWN AEROBIC AND ANAEROBIC Blood Culture adequate volume Performed at [facility], [address] [place], [place] [address]    Culture 07/22/2023    Final                   Value:NO GROWTH 5 DAYS Performed at [facility], [address], [place], [place] [address]    Report Status 07/22/2023 07/27/2023 FINAL   Final   Specimen Source 07/22/2023 URINE, CLEAN CATCH   Final   Color, Urine 07/22/2023 YELLOW  YELLOW Final   APPearance 07/22/2023 CLEAR  CLEAR Final   Specific Gravity, Urine 07/22/2023 1.012  1.005 - 1.030 Final   pH 07/22/2023 7.0  5.0 - 8.0 Final   Glucose, UA 07/22/2023 NEGATIVE  NEGATIVE mg/dL Final   Hgb urine dipstick 07/22/2023 SMALL (A)  NEGATIVE Final   Bilirubin Urine 07/22/2023 NEGATIVE  NEGATIVE Final   Ketones, ur 07/22/2023 NEGATIVE  NEGATIVE mg/dL Final   Protein, ur 66/44/0347 NEGATIVE  NEGATIVE mg/dL Final   Nitrite 42/59/5638 NEGATIVE  NEGATIVE Final   Leukocytes,Ua 07/22/2023 NEGATIVE  NEGATIVE Final   RBC / HPF 07/22/2023 6-10  0 - 5 RBC/hpf Final   WBC, UA 07/22/2023 0-5  0 - 5 WBC/hpf Final   Bacteria, UA 07/22/2023 NONE SEEN  NONE SEEN Final   Squamous Epithelial / HPF 07/22/2023 0-5  0 - 5 /HPF Final   Mucus 07/22/2023 PRESENT   Final  Appointment on 06/30/2023  Component Date Value Ref Range Status   Sodium 06/30/2023 140  135 - 145 mmol/L Final   Potassium 06/30/2023 3.7  3.5 - 5.1 mmol/L Final   Chloride 06/30/2023 107  98 - 111 mmol/L Final   CO2 06/30/2023 28  22 - 32 mmol/L Final   Glucose, Bld 06/30/2023 83  70 - 99 mg/dL Final   BUN 75/64/3329 10  8 - 23 mg/dL Final   Creatinine 51/88/4166 0.75  0.44 - 1.00 mg/dL Final   Calcium  06/30/2023 9.6  8.9 - 10.3 mg/dL Final   Total Protein 03/19/1600 7.0  6.5 -  8.1 g/dL Final   Albumin  06/30/2023 3.9  3.5 - 5.0 g/dL Final   AST 78/29/5621 20  15 - 41 U/L Final   ALT 06/30/2023 12  0 - 44 U/L Final   Alkaline Phosphatase  06/30/2023 80  38 - 126 U/L Final   Total Bilirubin 06/30/2023 0.5  0.3 - 1.2 mg/dL Final   GFR, Estimated 06/30/2023 >60  >60 mL/min Final   Anion gap 06/30/2023 5  5 - 15 Final   WBC 06/30/2023 3.2 (L)  4.0 - 10.5 K/uL Final   RBC 06/30/2023 3.86 (L)  3.87 - 5.11 MIL/uL Final   Hemoglobin 06/30/2023 12.3  12.0 - 15.0 g/dL Final   HCT 30/86/5784 36.4  36.0 - 46.0 % Final   MCV 06/30/2023 94.3  80.0 - 100.0 fL Final   MCH 06/30/2023 31.9  26.0 - 34.0 pg Final   MCHC 06/30/2023 33.8  30.0 - 36.0 g/dL Final   RDW 69/62/9528 12.4  11.5 - 15.5 % Final   Platelets 06/30/2023 160  150 - 400 K/uL Final   nRBC 06/30/2023 0.0  0.0 - 0.2 % Final   Neutrophils Relative % 06/30/2023 28  % Final   Neutro Abs 06/30/2023 0.9 (L)  1.7 - 7.7 K/uL Final   Lymphocytes Relative 06/30/2023 52  % Final   Lymphs Abs 06/30/2023 1.7  0.7 - 4.0 K/uL Final   Monocytes Relative 06/30/2023 16  % Final   Monocytes Absolute 06/30/2023 0.5  0.1 - 1.0 K/uL Final   Eosinophils Relative 06/30/2023 3  % Final   Eosinophils Absolute 06/30/2023 0.1  0.0 - 0.5 K/uL Final   Basophils Relative 06/30/2023 1  % Final   Basophils Absolute 06/30/2023 0.0  0.0 - 0.1 K/uL Final   Immature Granulocytes 06/30/2023 0  % Final   Abs Immature Granulocytes 06/30/2023 0.00  0.00 - 0.07 K/uL Final  Office Visit on 05/05/2023  Component Date Value Ref Range Status   TSH 05/05/2023 0.89  0.35 - 5.50 uIU/mL Final   Sodium 05/05/2023 136  135 - 145 mEq/L Final   Potassium 05/05/2023 4.0  3.5 - 5.1 mEq/L Final   Chloride 05/05/2023 102  96 - 112 mEq/L Final   CO2 05/05/2023 26  19 - 32 mEq/L Final   Glucose, Bld 05/05/2023 79  70 - 99 mg/dL Final   BUN 41/32/4401 16  6 - 23 mg/dL Final   Creatinine, Ser 05/05/2023 0.73  0.40 - 1.20 mg/dL Final   Total Bilirubin 05/05/2023 0.4  0.2 - 1.2 mg/dL Final   Alkaline Phosphatase 05/05/2023 88  39 - 117 U/L Final   AST 05/05/2023 19  0 - 37 U/L Final   ALT 05/05/2023 12  0 - 35 U/L Final    Total Protein 05/05/2023 6.9  6.0 - 8.3 g/dL Final   Albumin  05/05/2023 4.0  3.5 - 5.2 g/dL Final   GFR 02/72/5366 81.13  >60.00 mL/min Final   Calcium  05/05/2023 9.9  8.4 - 10.5 mg/dL Final   WBC 44/11/4740 4.3  4.0 - 10.5 K/uL Final   RBC 05/05/2023 3.97  3.87 - 5.11 Mil/uL Final   Hemoglobin 05/05/2023 12.4  12.0 - 15.0 g/dL Final   HCT 59/56/3875 37.7  36.0 - 46.0 % Final   MCV 05/05/2023 94.8  78.0 - 100.0 fl Final   MCHC 05/05/2023 32.9  30.0 - 36.0 g/dL Final   RDW 64/33/2951 13.7  11.5 - 15.5 % Final   Platelets 05/05/2023 185.0  150.0 -  400.0 K/uL Final   Neutrophils Relative % 05/05/2023 33.8 (L)  43.0 - 77.0 % Final   Lymphocytes Relative 05/05/2023 47.1 (H)  12.0 - 46.0 % Final   Monocytes Relative 05/05/2023 16.1 (H)  3.0 - 12.0 % Final   Eosinophils Relative 05/05/2023 2.5  0.0 - 5.0 % Final   Basophils Relative 05/05/2023 0.5  0.0 - 3.0 % Final   Neutro Abs 05/05/2023 1.5  1.4 - 7.7 K/uL Final   Lymphs Abs 05/05/2023 2.0  0.7 - 4.0 K/uL Final   Monocytes Absolute 05/05/2023 0.7  0.1 - 1.0 K/uL Final   Eosinophils Absolute 05/05/2023 0.1  0.0 - 0.7 K/uL Final   Basophils Absolute 05/05/2023 0.0  0.0 - 0.1 K/uL Final   Hgb A1c MFr Bld 05/05/2023 5.4  4.6 - 6.5 % Final   Vitamin B-12 05/05/2023 326  211 - 911 pg/mL Final   Folate 05/05/2023 16.7  >5.9 ng/mL Final   VITD 05/05/2023 33.61  30.00 - 100.00 ng/mL Final  Office Visit on 02/07/2023  Component Date Value Ref Range Status   Sodium 02/07/2023 140  135 - 145 mEq/L Final   Potassium 02/07/2023 3.9  3.5 - 5.1 mEq/L Final   Chloride 02/07/2023 103  96 - 112 mEq/L Final   CO2 02/07/2023 28  19 - 32 mEq/L Final   Glucose, Bld 02/07/2023 69 (L)  70 - 99 mg/dL Final   BUN 57/84/6962 10  6 - 23 mg/dL Final   Creatinine, Ser 02/07/2023 0.82  0.40 - 1.20 mg/dL Final   Total Bilirubin 02/07/2023 0.4  0.2 - 1.2 mg/dL Final   Alkaline Phosphatase 02/07/2023 81  39 - 117 U/L Final   AST 02/07/2023 22  0 - 37 U/L Final   ALT  02/07/2023 11  0 - 35 U/L Final   Total Protein 02/07/2023 6.7  6.0 - 8.3 g/dL Final   Albumin  02/07/2023 3.9  3.5 - 5.2 g/dL Final   GFR 95/28/4132 70.68  >60.00 mL/min Final   Calcium  02/07/2023 9.9  8.4 - 10.5 mg/dL Final   Amylase 44/09/270 57  27 - 131 U/L Final   Lipase 02/07/2023 14  7 - 60 U/L Final  Office Visit on 11/30/2022  Component Date Value Ref Range Status   H pylori Ag, Stl 12/13/2022 Negative  Negative Final  Scanned Document on 11/16/2022  Component Date Value Ref Range Status   EGFR 11/16/2022 77.0   Final  There may be more visits with results that are not included.  No image results found. No results found.       Assessment & Plan Hypertension, unspecified type  Proteinuria, unspecified type  Neutropenia, unspecified type (HCC)  Other specified anxiety disorders   Assessment and Plan Assessment & Plan           Orders Placed in Encounter:   Lab Orders  No laboratory test(s) ordered today   Imaging Orders  No imaging studies ordered today   Referral Orders  No referral(s) requested today   No orders of the defined types were placed in this encounter.    No orders of the defined types were placed in this encounter.  ED Discharge Orders     None         This document was synthesized by artificial intelligence ([employer]) using HIPAA-compliant recording of the clinical interaction;   We discussed the use of AI scribe software for clinical note transcription with the patient, who gave verbal consent to proceed. additional  Info: This encounter employed state-of-the-art, real-time, collaborative documentation. The patient actively reviewed and assisted in updating their electronic medical record on a shared screen, ensuring transparency and facilitating joint problem-solving for the problem list, overview, and plan. This approach promotes accurate, informed care. The treatment plan was discussed and reviewed in detail, including medication  safety, potential side effects, and all patient questions. We confirmed understanding and comfort with the plan. Follow-up instructions were established, including contacting the office for any concerns, returning if symptoms worsen, persist, or new symptoms develop, and precautions for potential emergency department visits.

## 2024-03-09 NOTE — Patient Instructions (Signed)
 VISIT SUMMARY:  Today, we reviewed your progress with your throat infection and managed your chronic conditions. Your throat infection is improving, and we discussed your blood pressure, potassium levels, and anxiety management. We also reviewed your general health and dietary habits.  YOUR PLAN:  -PARATONSILLAR ABSCESS WITH CHRONIC SINUSITIS: A paratonsillar abscess is a collection of pus near the tonsils, often due to infection. Your abscess is improving with antibiotics and saline treatment. Continue using the saline treatment to help the antibiotics work better.  -HYPERTENSION: Hypertension is high blood pressure. Your blood pressure is borderline controlled with your current medication. We discussed possibly switching your medication to better manage your potassium levels and will consult with your cardiologist. Continue monitoring your blood pressure at home.  -PROTEINURIA: Proteinuria is the presence of excess protein in the urine, which can indicate kidney damage. We will recheck your urine to assess any potential kidney damage.  -HYPOKALEMIA: Hypokalemia is low potassium levels in the blood. This is likely due to your blood pressure medication. Continue taking your potassium supplements and consider increasing potassium-rich foods in your diet.  -NEUTROPENIA: Neutropenia is a low white blood cell count, which can make you more susceptible to infections. This may be due to a vitamin deficiency or lupus. Start taking a multivitamin to address any potential deficiencies.  -ANXIETY: Anxiety is a feeling of worry or fear. Your anxiety is managed with buspirone , and we have increased your dose. Avoid watching the news to help reduce your anxiety.  -GENERAL HEALTH MAINTENANCE: We discussed dietary modifications to reduce cardiovascular risk. Your cholesterol levels are good, and you are not diabetic. Incorporate healthy fats into your diet, such as fish, flax seeds, nuts, extra virgin olive oil,  and avocados.  INSTRUCTIONS:  Please continue monitoring your blood pressure at home and keep a record of your readings. We will recheck your urine for protein to assess kidney damage. Consult with your cardiologist, Dr. Annabella Scarce, about possibly switching your blood pressure medication. Start taking a multivitamin and consider increasing potassium-rich foods in your diet. Avoid watching the news to help manage your anxiety.   Diet: Focus on a Mediterranean-style diet, limit saturated fats and sugars, and increase omega-3 fatty acids (fish, flaxseeds,nuts,extra virgin olive oil, avocados). Exercise: Engage in regular physical activity (aerobic exercises are particularly beneficial for HDL). Weight Management: Maintain a healthy weight.Please try these tips to maintain a healthy lifestyle:  Avoid processed foods like bologna, salami, spam, candy bars  Try to eat non-starchy and fiber-rich vegetables, 30%-plus lean proteins, and only healthy fats (nuts, extra virgin olive oil, fatty fish) or lean meats.  Try to completely eliminate sugar containing beverages. This includes juice, non diet soda, and sweet tea.   Drink at least 1 glass of water with each meal and aim for at least 8 glasses per day  Eat a diet rich and fruits and vegetables and healthy fats (plant and fish fats) to reduce risk of heart attack and stroke.  In particular, avocado, extra virgin olive oil, nuts, and fatty fish are known to be great for your blood vessels.  Exercise at least 150 minutes every week.  Try to do all 3 types of exercise: 1)  Stretching (yoga-type), 2) cardio (220-75 y.o.) = 145  is max heart rate) and 3) resistance training exercises for muscle building.  Try to avoid exercise with a lot of impact (running on concrete is hard on joints) and extreme exercise (marathons) which have been shown to do more harm than  good.   Keep challenging yourself to make positive and sustainable lifestyle changes. Love  and be good to your future self!  You can't hate yourself into positive changes.  If you know you have an unhealthy coping strategy (e.g. substance or food misuse) or if depression/anxiety is holding you back, please let me know;  I am trained in addiction treatment and passionate about helping people to make positive life changes!  Get good sleep (8 hours on a consistent schedule.  Sleep is important for mood management, weight management, and cognitive performance. Snoring and sleep apnea are connected to alzheimer's dementia, depression, obesity, and irritability  For your mental status:  practice gratitude exercises daily, focus on self-growth and creative interests, and use mindfulness to stay out of negative emotional states.

## 2024-03-10 LAB — URINALYSIS W MICROSCOPIC + REFLEX CULTURE
Bilirubin Urine: NEGATIVE
Glucose, UA: NEGATIVE
Hgb urine dipstick: NEGATIVE
Hyaline Cast: NONE SEEN /LPF
Leukocyte Esterase: NEGATIVE
Nitrites, Initial: NEGATIVE
Specific Gravity, Urine: 1.024 (ref 1.001–1.035)
pH: 6 (ref 5.0–8.0)

## 2024-03-10 LAB — PROTEIN / CREATININE RATIO, URINE
Creatinine, Urine: 215 mg/dL (ref 20–275)
Protein/Creat Ratio: 88 mg/g{creat} (ref 24–184)
Protein/Creatinine Ratio: 0.088 mg/mg{creat} (ref 0.024–0.184)
Total Protein, Urine: 19 mg/dL (ref 5–24)

## 2024-03-10 LAB — NO CULTURE INDICATED

## 2024-03-11 ENCOUNTER — Ambulatory Visit: Payer: Self-pay | Admitting: Internal Medicine

## 2024-03-11 DIAGNOSIS — D709 Neutropenia, unspecified: Secondary | ICD-10-CM | POA: Insufficient documentation

## 2024-03-11 DIAGNOSIS — R809 Proteinuria, unspecified: Secondary | ICD-10-CM | POA: Insufficient documentation

## 2024-03-11 NOTE — Assessment & Plan Note (Signed)
 Complexity: Moderate. A chronic condition with inadequate response to current therapy, requiring prescription drug management. Assessment: Patient reports ongoing anxiety symptoms that are not adequately controlled on her current dose of buspirone  10 mg BID, particularly in response to external stressors. Plan / Medical Decision-Making:  Prescription Drug Management: After assessing symptoms and risk of side effects, the decision was made to titrate her medication. The dose of buspirone  is being increased to 15 mg BID to optimize therapeutic effect. A new prescription was sent to her pharmacy. Counseling: Reinforced non-pharmacologic management strategies, including counseling her on avoiding known triggers.

## 2024-03-11 NOTE — Assessment & Plan Note (Signed)
 Proteinuria is possibly due to hypertension. Previous urinalysis in May showed no protein, but April's did. Recheck with urinalysis to assess kidney damage.

## 2024-03-11 NOTE — Assessment & Plan Note (Signed)
 Complexity: Moderate. This chronic illness is not at its therapeutic goal, requiring active management and coordination with a specialist. Assessment: Blood pressure control is suboptimal and precarious. While home readings average 140/76 mmHg (at the upper limit of the goal <140/90 mmHg), there is a documented pattern of significant in-office elevations (e.g., 150/86 mmHg on 03/07/24). 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 These factors, combined with her history of LVH and aortic atherosclerosis, make tighter BP control medically necessary. Plan / Medical Decision-Making:  Prescription Drug Management: Assessed the risks and benefits of the current losartan -HCTZ regimen. Due to the persistent, symptomatic hypokalemia, a change is necessary. After considering alternatives, the plan is to coordinate with her cardiologist, Dr. Annabella Scarce, to transition to a new regimen, likely olmesartan and amlodipine. This strategy is designed to improve BP control while resolving the medication-induced electrolyte imbalance and eliminating the need for potassium supplementation. Data Review & Orders: Reviewed extensive home BP logs and past lab trends. Ordered a urine protein/creatinine ratio today to quantify the degree of proteinuria and establish a new baseline for monitoring renal response to therapy changes. Follow-up: Patient to continue monitoring BP at home and report trends. Will follow up on cardiology recommendations and lab results.

## 2024-03-11 NOTE — Assessment & Plan Note (Signed)
 Complexity: Moderate. This is a medication-induced problem complicating the management of another chronic condition (HTN). Assessment: Potassium remains at the low end of the normal range (K+ 3.5 mmol/L on 01/05/24) despite oral potassium supplementation. This represents a significant management challenge and is the primary driver for altering her antihypertensive regimen. Patient adherence to dietary changes has been limited. Plan / Medical Decision-Making:  The decision to change her primary BP medication is a direct result of managing this adverse effect. Will continue potassium supplementation in the interim until the new antihypertensive regimen is initiated. Re-emphasized the importance of dietary potassium.

## 2024-03-11 NOTE — Assessment & Plan Note (Signed)
 Complexity: Moderate. A chronic condition with uncertain etiology that increases the patient's risk profile. Assessment: Etiology remains uncertain, with differential diagnoses including effects of her underlying SLE or a nutritional deficiency. Review of recent labs confirms persistent neutropenia (Absolute Neutrophils 0.8 K/uL on 01/05/24), which places her at an increased risk of infection and complicates the management of acute illnesses like her recent abscess. Plan / Medical Decision-Making:  Will continue to monitor this condition via periodic CBC. To address a potential nutritional component, advised patient to begin taking a daily multivitamin. This chronic condition was a key factor in the decision-making for managing her acute throat infection.

## 2024-03-16 DIAGNOSIS — M25562 Pain in left knee: Secondary | ICD-10-CM | POA: Diagnosis not present

## 2024-03-28 DIAGNOSIS — M25562 Pain in left knee: Secondary | ICD-10-CM | POA: Diagnosis not present

## 2024-04-05 DIAGNOSIS — M19042 Primary osteoarthritis, left hand: Secondary | ICD-10-CM | POA: Diagnosis not present

## 2024-04-06 DIAGNOSIS — H5213 Myopia, bilateral: Secondary | ICD-10-CM | POA: Diagnosis not present

## 2024-04-06 DIAGNOSIS — H52209 Unspecified astigmatism, unspecified eye: Secondary | ICD-10-CM | POA: Diagnosis not present

## 2024-04-09 ENCOUNTER — Emergency Department (HOSPITAL_BASED_OUTPATIENT_CLINIC_OR_DEPARTMENT_OTHER): Admission: EM | Admit: 2024-04-09 | Discharge: 2024-04-09 | Disposition: A | Discharge status: Home / Self Care

## 2024-04-09 ENCOUNTER — Emergency Department (HOSPITAL_BASED_OUTPATIENT_CLINIC_OR_DEPARTMENT_OTHER): Admitting: Radiology

## 2024-04-09 ENCOUNTER — Emergency Department (HOSPITAL_BASED_OUTPATIENT_CLINIC_OR_DEPARTMENT_OTHER)

## 2024-04-09 ENCOUNTER — Other Ambulatory Visit: Payer: Self-pay

## 2024-04-09 DIAGNOSIS — U071 COVID-19: Secondary | ICD-10-CM | POA: Insufficient documentation

## 2024-04-09 DIAGNOSIS — R058 Other specified cough: Secondary | ICD-10-CM | POA: Diagnosis not present

## 2024-04-09 DIAGNOSIS — R519 Headache, unspecified: Secondary | ICD-10-CM | POA: Insufficient documentation

## 2024-04-09 DIAGNOSIS — R079 Chest pain, unspecified: Secondary | ICD-10-CM | POA: Diagnosis not present

## 2024-04-09 DIAGNOSIS — R0981 Nasal congestion: Secondary | ICD-10-CM | POA: Diagnosis not present

## 2024-04-09 DIAGNOSIS — R42 Dizziness and giddiness: Secondary | ICD-10-CM | POA: Diagnosis not present

## 2024-04-09 DIAGNOSIS — R0789 Other chest pain: Secondary | ICD-10-CM | POA: Diagnosis not present

## 2024-04-09 LAB — HEPATIC FUNCTION PANEL
ALT: 17 U/L (ref 0–44)
AST: 28 U/L (ref 15–41)
Albumin: 4.2 g/dL (ref 3.5–5.0)
Alkaline Phosphatase: 143 U/L — ABNORMAL HIGH (ref 38–126)
Bilirubin, Direct: 0.1 mg/dL (ref 0.0–0.2)
Indirect Bilirubin: 0.2 mg/dL — ABNORMAL LOW (ref 0.3–0.9)
Total Bilirubin: 0.3 mg/dL (ref 0.0–1.2)
Total Protein: 7.1 g/dL (ref 6.5–8.1)

## 2024-04-09 LAB — LIPASE, BLOOD: Lipase: 16 U/L (ref 11–51)

## 2024-04-09 LAB — RESP PANEL BY RT-PCR (RSV, FLU A&B, COVID)  RVPGX2
Influenza A by PCR: NEGATIVE
Influenza B by PCR: NEGATIVE
Resp Syncytial Virus by PCR: NEGATIVE
SARS Coronavirus 2 by RT PCR: POSITIVE — AB

## 2024-04-09 LAB — CBC
HCT: 35.6 % — ABNORMAL LOW (ref 36.0–46.0)
Hemoglobin: 11.9 g/dL — ABNORMAL LOW (ref 12.0–15.0)
MCH: 31.3 pg (ref 26.0–34.0)
MCHC: 33.4 g/dL (ref 30.0–36.0)
MCV: 93.7 fL (ref 80.0–100.0)
Platelets: 185 K/uL (ref 150–400)
RBC: 3.8 MIL/uL — ABNORMAL LOW (ref 3.87–5.11)
RDW: 12.9 % (ref 11.5–15.5)
WBC: 4.7 K/uL (ref 4.0–10.5)
nRBC: 0 % (ref 0.0–0.2)

## 2024-04-09 LAB — BASIC METABOLIC PANEL WITH GFR
Anion gap: 12 (ref 5–15)
BUN: 8 mg/dL (ref 8–23)
CO2: 24 mmol/L (ref 22–32)
Calcium: 9.6 mg/dL (ref 8.9–10.3)
Chloride: 103 mmol/L (ref 98–111)
Creatinine, Ser: 0.77 mg/dL (ref 0.44–1.00)
GFR, Estimated: >60 mL/min (ref >60)
Glucose, Bld: 94 mg/dL (ref 70–99)
Potassium: 3.9 mmol/L (ref 3.5–5.1)
Sodium: 139 mmol/L (ref 135–145)

## 2024-04-09 LAB — TROPONIN T, HIGH SENSITIVITY: Troponin T High Sensitivity: <15 ng/L (ref <19)

## 2024-04-09 MED ORDER — SODIUM CHLORIDE 0.9% FLUSH: 3.0000 mL | Freq: Once | INTRAVENOUS | Status: DC

## 2024-04-09 MED ORDER — ACETAMINOPHEN 325 MG PO TABS
650.0000 mg | ORAL_TABLET | Freq: Once | ORAL | Status: AC
  Administered 2024-04-09: 650 mg via ORAL
  Filled 2024-04-09: qty 2

## 2024-04-09 MED ORDER — LACTATED RINGERS IV BOLUS
1000.0000 mL | Freq: Once | INTRAVENOUS | Status: AC
  Administered 2024-04-09: 1000 mL via INTRAVENOUS

## 2024-04-09 MED ORDER — ONDANSETRON HCL 4 MG/2ML IJ SOLN
4.0000 mg | Freq: Once | INTRAMUSCULAR | Status: AC
  Administered 2024-04-09: 4 mg via INTRAVENOUS
  Filled 2024-04-09: qty 2

## 2024-04-09 NOTE — ED Provider Notes (Signed)
 [place] EMERGENCY DEPARTMENT AT [facility] Provider Note   CSN: [id number] Arrival date & time: 04/09/24  1037     Patient presents with: Dizziness   [name] is a 73 y.o. female.   This is a 73 year old female presenting emergency department for generalized malaise.  Ported normal state of health till this morning woke up with nausea vomiting, diarrhea, also having nasal congestion and head pain.  Several episodes of nonbloody nonbilious emesis and several episodes of watery diarrhea.  No abdominal pain.  No chest pain.  Does endorse some lightheadedness.   Dizziness      Prior to Admission medications   Medication Sig Start Date End Date Taking? Authorizing Provider  busPIRone  (BUSPAR ) 15 MG tablet Take 1 tablet (15 mg total) by mouth 2 (two) times daily. 03/09/24   Jesus Bernardino MATSU, MD  celecoxib  (CELEBREX ) 50 MG capsule TAKE 1 CAPSULE TWICE DAILY. 09/29/23   Jesus Bernardino MATSU, MD  famotidine -calcium  carbonate-magnesium  hydroxide (PEPCID  COMPLETE) [phone] MG chewable tablet CHEW 1 TABLET 2 TIMES DAILYAS NEEDED. 09/29/23   Jesus Bernardino MATSU, MD  fluticasone  (FLONASE ) 50 MCG/ACT nasal spray Place 2 sprays into both nostrils daily. 03/07/24   Jesus Bernardino MATSU, MD  hydroxychloroquine (PLAQUENIL) 200 MG tablet Take 200 mg by mouth 2 (two) times daily.    [provider]  lidocaine  (XYLOCAINE ) 2 % solution Use as directed 15 mLs in the mouth or throat every 3 (three) hours as needed for mouth pain (Throat pain; gargle; may spit or swallow). 03/01/24   Lucius Krabbe, NP  loratadine  (CLARITIN ) 10 MG tablet Take 1 tablet (10 mg total) by mouth daily. 03/07/24   Jesus Bernardino MATSU, MD  losartan -hydrochlorothiazide  (HYZAAR) 50-12.5 MG tablet Take 1 tablet by mouth daily. 10/31/23   Jesus Bernardino MATSU, MD  metoprolol  succinate (TOPROL -XL) 50 MG 24 hr tablet Take 1 tablet (50 mg total) by mouth daily. 05/02/23   Raford Riggs, MD  Multiple Vitamins-Minerals  (EMERGEN-C IMMUNE PLUS PO) Take 1 tablet by mouth daily.    [provider]  nitrofurantoin , macrocrystal-monohydrate, (MACROBID ) 100 MG capsule Take 1 capsule (100 mg total) by mouth 2 (two) times daily. 02/07/24   Wendolyn Jenkins Jansky, MD  potassium chloride  (KLOR-CON ) 10 MEQ tablet TAKE 1 TABLET BY MOUTH EVERY DAY Patient not taking: Reported on 03/07/2024 12/28/23   Jesus Bernardino MATSU, MD  promethazine -dextromethorphan (PROMETHAZINE -DM) 6.25-15 MG/5ML syrup Take 5 mLs by mouth 4 (four) times daily as needed for cough. 03/07/24   Jesus Bernardino MATSU, MD  Saline (SIMPLY SALINE) 0.9 % AERS Place 2 each into the nose as directed. Use nightly for sinus hygiene long-term.  Can also be used as many times daily as desired to assist with clearing congested sinuses. 03/07/24   Jesus Bernardino MATSU, MD    Allergies: Iodine, Iohexol , Latex, Penicillins, Celecoxib , Lactose, Other, Penicillin g, and Sulfa  antibiotics    Review of Systems  Neurological:  Positive for dizziness.    Updated Vital Signs BP (!) 195/110 (BP Location: Right Arm)   Pulse 84   Temp 98.4 F (36.9 C) (Oral)   Resp 18   SpO2 100%   Physical Exam Vitals and nursing note reviewed.  Constitutional:      General: She is not in acute distress.    Appearance: She is not toxic-appearing.  HENT:     Head: Normocephalic.     Nose: Nose normal.  Cardiovascular:     Rate and Rhythm: Normal rate.  Pulmonary:  Effort: Pulmonary effort is normal.     Breath sounds: Normal breath sounds.  Abdominal:     General: Abdomen is flat. There is no distension.     Tenderness: There is no abdominal tenderness. There is no guarding or rebound.  Musculoskeletal:        General: Normal range of motion.  Skin:    General: Skin is warm and dry.     Capillary Refill: Capillary refill takes less than 2 seconds.  Neurological:     General: No focal deficit present.     Mental Status: She is alert.     (all labs ordered are listed, but only  abnormal results are displayed) Labs Reviewed  RESP PANEL BY RT-PCR (RSV, FLU A&B, COVID)  RVPGX2 - Abnormal; Notable for the following components:      Result Value   SARS Coronavirus 2 by RT PCR POSITIVE (*)    All other components within normal limits  CBC - Abnormal; Notable for the following components:   RBC 3.80 (*)    Hemoglobin 11.9 (*)    HCT 35.6 (*)    All other components within normal limits  HEPATIC FUNCTION PANEL - Abnormal; Notable for the following components:   Alkaline Phosphatase 143 (*)    Indirect Bilirubin 0.2 (*)    All other components within normal limits  BASIC METABOLIC PANEL WITH GFR  LIPASE, BLOOD  TROPONIN T, HIGH SENSITIVITY    EKG: None  Radiology: DG Chest 2 View Result Date: 04/09/2024 CLINICAL DATA:  Chest pain with productive cough. EXAM: CHEST - 2 VIEW COMPARISON:  07/22/2023 and 06/04/2022 radiographs. Cardiac CT 06/02/2023. FINDINGS: The heart size and mediastinal contours are normal. The lungs are clear. There is no pleural effusion or pneumothorax. No acute osseous findings are identified. Mild degenerative changes in the spine. Telemetry leads overlie the chest. IMPRESSION: No evidence of acute cardiopulmonary process. Electronically Signed   By: Elsie Perone M.D.   On: 04/09/2024 12:26   CT Head Wo Contrast Result Date: 04/09/2024 CLINICAL DATA:  73 year old female with new onset headache. Cough. Dizziness. EXAM: CT HEAD WITHOUT CONTRAST TECHNIQUE: Contiguous axial images were obtained from the base of the skull through the vertex without intravenous contrast. RADIATION DOSE REDUCTION: This exam was performed according to the departmental dose-optimization program which includes automated exposure control, adjustment of the mA and/or kV according to patient size and/or use of iterative reconstruction technique. COMPARISON:  Head CT 07/06/2016. FINDINGS: Brain: Cerebral volume remains normal for age. No midline shift, ventriculomegaly, mass  effect, evidence of mass lesion, intracranial hemorrhage or evidence of cortically based acute infarction. Gray-white differentiation stable and within normal limits for age. Vascular: No suspicious intracranial vascular hyperdensity. Skull: Stable and intact.  No acute osseous abnormality identified. Sinuses/Orbits: Visualized paranasal sinuses and mastoids are stable and well aerated. Other: Postoperative changes to both globes since 2017. No acute orbit or scalp soft tissue finding. IMPRESSION: Normal for age noncontrast Head CT. Electronically Signed   By: VEAR Hurst M.D.   On: 04/09/2024 12:24     Procedures   Medications Ordered in the ED  lactated ringers  bolus 1,000 mL (0 mLs Intravenous Stopped 04/09/24 1240)  ondansetron  (ZOFRAN ) injection 4 mg (4 mg Intravenous Given 04/09/24 1145)  acetaminophen  (TYLENOL ) tablet 650 mg (650 mg Oral Given 04/09/24 1309)    Clinical Course as of 04/09/24 1549  Mon Apr 09, 2024  1256 SARS Coronavirus 2 by RT PCR(!): POSITIVE [TY]    Clinical Course  User Index [TY] Neysa Caron PARAS, DO                                 Medical Decision Making 73 year old female presenting emergency department with generalized malaise, nausea vomiting diarrhea.  She is afebrile nontachycardic, hemodynamically stable although hypertensive.  Physical exam reassuring with no localizing neurodeficits.  CT head obtained due to new onset headache.  Negative for acute pathology.  Chest x-ray without pneumonia or pneumothorax on my independent review.  Workup largely reassuring.  No leukocytosis to suggest systemic infection.  Minor anemia.  No significant metabolic derangements, normal kidney function.  No transaminitis to suggest hepatobiliary disease.  Lipase normal.  Pancreatitis unlikely.  Troponin negative.  ACS unlikely.  EKG also without STEMI or ST segment changes to indicate ischemia.  She was COVID-positive discussed Paxlovid  and indications, declined.  Discussed supportive  care.  She is not hypoxic or in respiratory distress.  Feel that she is appropriate for outpatient management.  Stable for discharge at this time  Amount and/or Complexity of Data Reviewed Labs: ordered. Decision-making details documented in ED Course. Radiology: ordered.  Risk OTC drugs. Prescription drug management.      Final diagnoses:  COVID-19    ED Discharge Orders     None          Neysa Caron PARAS, DO 04/09/24 1549

## 2024-04-09 NOTE — Discharge Instructions (Signed)
 As discussed may take over-the-counter medications for your symptoms such as Tylenol , ibuprofen , Mucinex .  Return immediately for develop worsening chest pain, shortness of breath, lightheadedness, passout, inability eat or drink due to nausea vomiting or any new or worsening symptoms that are concerning to you.

## 2024-04-09 NOTE — ED Notes (Signed)
 Dc paperwork given and verbally understood.Jaclyn AasAaron Nguyen

## 2024-04-09 NOTE — ED Triage Notes (Addendum)
 Pt c/o n/v/d, productive cough, feeling dizzy like I'm gonna pass out, and head congestion since awaking this morning at 215am.  Pt reports 5 episodes of diarrhea, dark brown in color.  Emesis x2, normal stomach contents.  Pt denies taking any OTC meds at home.    Pt also reports left chest heaviness and left shoulder pain also starting this morning.

## 2024-04-10 ENCOUNTER — Ambulatory Visit: Payer: Self-pay

## 2024-04-10 ENCOUNTER — Other Ambulatory Visit (HOSPITAL_BASED_OUTPATIENT_CLINIC_OR_DEPARTMENT_OTHER): Payer: Self-pay | Admitting: Cardiovascular Disease

## 2024-04-10 ENCOUNTER — Encounter: Payer: Self-pay | Admitting: Internal Medicine

## 2024-04-10 DIAGNOSIS — U071 COVID-19: Secondary | ICD-10-CM

## 2024-04-10 NOTE — Telephone Encounter (Signed)
 FYI Only or Action Required?: FYI only for provider.  Patient was last seen in primary care on 03/09/2024 by Jesus Bernardino MATSU, MD.  Called Nurse Triage reporting Shortness of Breath.  Symptoms began yesterday.  Interventions attempted: Nothing.  Symptoms are: gradually worsening. Diagnosed with COVID yesterday in ED. SOB worsened last night. Will call 911 for transportation to ED. Too weak to go by car. Sister is with pt.  Triage Disposition: Call EMS 911 Now  Patient/caregiver understands and will follow disposition?: Yes   Copied from CRM [id number]. Topic: Clinical - Red Word Triage >> Apr 10, 2024 10:48 AM Jayma L wrote: Red Word that prompted transfer to Nurse Triage: patient was seen yesterday and stated she has covid, if it was to get any worse call her pcp . She said she's having shortness of breath and it is getting worse. Reason for Disposition  Sounds like a life-threatening emergency to the triager  Answer Assessment - Initial Assessment Questions 1. RESPIRATORY STATUS: Describe your breathing? (e.g., wheezing, shortness of breath, unable to speak, severe coughing)      SOB 2. ONSET: When did this breathing problem begin?      Last night 3. PATTERN Does the difficult breathing come and go, or has it been constant since it started?      Comes and goes 4. SEVERITY: How bad is your breathing? (e.g., mild, moderate, severe)      Moderate  5. RECURRENT SYMPTOM: Have you had difficulty breathing before? If Yes, ask: When was the last time? and What happened that time?      no 6. CARDIAC HISTORY: Do you have any history of heart disease? (e.g., heart attack, angina, bypass surgery, angioplasty)      no 7. LUNG HISTORY: Do you have any history of lung disease?  (e.g., pulmonary embolus, asthma, emphysema)     COVID POSITIVE 8. CAUSE: What do you think is causing the breathing problem?      COVID 9. OTHER SYMPTOMS: Do you have any other symptoms? (e.g.,  chest pain, cough, dizziness, fever, runny nose)     cough 10. O2 SATURATION MONITOR:  Do you use an oxygen saturation monitor (pulse oximeter) at home? If Yes, ask: What is your reading (oxygen level) today? What is your usual oxygen saturation reading? (e.g., 95%)       no 11. PREGNANCY: Is there any chance you are pregnant? When was your last menstrual period?       no 12. TRAVEL: Have you traveled out of the country in the last month? (e.g., travel history, exposures)       no  Protocols used: Breathing Difficulty-A-AH

## 2024-04-11 MED ORDER — BENZONATATE 100 MG PO CAPS: 100.0000 mg | ORAL_CAPSULE | Freq: Two times a day (BID) | ORAL | 0 refills | Status: DC | PRN

## 2024-04-13 ENCOUNTER — Other Ambulatory Visit: Payer: Self-pay | Admitting: Internal Medicine

## 2024-04-13 DIAGNOSIS — R1010 Upper abdominal pain, unspecified: Secondary | ICD-10-CM

## 2024-04-13 DIAGNOSIS — R1084 Generalized abdominal pain: Secondary | ICD-10-CM

## 2024-04-21 ENCOUNTER — Emergency Department (HOSPITAL_COMMUNITY)

## 2024-04-21 ENCOUNTER — Other Ambulatory Visit: Payer: Self-pay

## 2024-04-21 ENCOUNTER — Emergency Department (HOSPITAL_COMMUNITY)
Admission: EM | Admit: 2024-04-21 | Discharge: 2024-04-22 | Disposition: A | Discharge status: Home / Self Care | Attending: Emergency Medicine | Admitting: Emergency Medicine

## 2024-04-21 ENCOUNTER — Encounter (HOSPITAL_COMMUNITY): Payer: Self-pay

## 2024-04-21 DIAGNOSIS — Z96652 Presence of left artificial knee joint: Secondary | ICD-10-CM | POA: Insufficient documentation

## 2024-04-21 DIAGNOSIS — W1839XA Other fall on same level, initial encounter: Secondary | ICD-10-CM | POA: Diagnosis not present

## 2024-04-21 DIAGNOSIS — Z79899 Other long term (current) drug therapy: Secondary | ICD-10-CM | POA: Diagnosis not present

## 2024-04-21 DIAGNOSIS — I10 Essential (primary) hypertension: Secondary | ICD-10-CM | POA: Diagnosis not present

## 2024-04-21 DIAGNOSIS — W19XXXA Unspecified fall, initial encounter: Secondary | ICD-10-CM

## 2024-04-21 DIAGNOSIS — Z91040 Latex allergy status: Secondary | ICD-10-CM | POA: Diagnosis not present

## 2024-04-21 DIAGNOSIS — Z853 Personal history of malignant neoplasm of breast: Secondary | ICD-10-CM | POA: Diagnosis not present

## 2024-04-21 DIAGNOSIS — Y9222 Religious institution as the place of occurrence of the external cause: Secondary | ICD-10-CM | POA: Diagnosis not present

## 2024-04-21 DIAGNOSIS — S8992XA Unspecified injury of left lower leg, initial encounter: Secondary | ICD-10-CM | POA: Diagnosis not present

## 2024-04-21 DIAGNOSIS — Y9301 Activity, walking, marching and hiking: Secondary | ICD-10-CM | POA: Insufficient documentation

## 2024-04-21 DIAGNOSIS — M25562 Pain in left knee: Secondary | ICD-10-CM | POA: Diagnosis not present

## 2024-04-21 DIAGNOSIS — Z85038 Personal history of other malignant neoplasm of large intestine: Secondary | ICD-10-CM | POA: Insufficient documentation

## 2024-04-21 DIAGNOSIS — S80919A Unspecified superficial injury of unspecified knee, initial encounter: Secondary | ICD-10-CM | POA: Diagnosis not present

## 2024-04-21 DIAGNOSIS — Z743 Need for continuous supervision: Secondary | ICD-10-CM | POA: Diagnosis not present

## 2024-04-21 DIAGNOSIS — M25462 Effusion, left knee: Secondary | ICD-10-CM | POA: Diagnosis not present

## 2024-04-21 MED ORDER — DIPHENHYDRAMINE HCL 25 MG PO CAPS
25.0000 mg | ORAL_CAPSULE | Freq: Once | ORAL | Status: AC
  Administered 2024-04-21: 25 mg via ORAL
  Filled 2024-04-21: qty 1

## 2024-04-21 MED ORDER — MORPHINE SULFATE (PF) 4 MG/ML IV SOLN
4.0000 mg | Freq: Once | INTRAVENOUS | Status: AC
  Administered 2024-04-21: 4 mg via INTRAVENOUS
  Filled 2024-04-21: qty 1

## 2024-04-21 NOTE — ED Triage Notes (Signed)
 Pt BIB [facility] for mechanical fall. Pt reports that she was walking backwards out of a bathroom while cleaning her church. She fell straight back and now reports L knee pain. H/x L knee replacement. Home brace present.

## 2024-04-21 NOTE — ED Notes (Signed)
 Pt reports small whelp after pain medicine. No airway compromise. No tongue/lip swelling. MD made aware.

## 2024-04-21 NOTE — ED Provider Notes (Signed)
 [place] [facility]MENT AT [facility] Provider Note   CSN: [id number] Arrival date & time: 04/21/24  2047     History {Add pertinent medical, surgical, social history, OB history to HPI:1} Chief Complaint  Patient presents with   Fall   Knee Pain    [name] is a 73 y.o. female with PMH as listed below who presents with severe left knee pain after a fall at the church today.  She has chronic problems with the left knee and normally wears a brace and it does not normally straighten out.  She states that when she fell today her leg straight and for the first time in many years.  She has severe pain in the left knee.  Did hit her head but did not lose consciousness and does not take a blood thinner.  States she did not hit her head hard.  Denies any neck, chest, back pain.  No numbness tingling in the leg.  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   Past Medical History:  Diagnosis Date   Acute pain of right shoulder 11/01/2022   Allergy 1967   Anxiety    Aortic atherosclerosis (HCC) 11/01/2022   On 2022 CT abdomen.   Arthritis    R knee, hands    Back pain    Blood transfusion    1986- post-partial hysterectomy   Blood transfusion without reported diagnosis    Breast cancer (HCC) 2009   right lumpectomy and radiation   Cataract 12/2023   both eyes   Chicken pox    Colon cancer (HCC) 2003   per pt report, cancer found in a colon polyp in 2003, procedure done in [place].  did not require surgery, chemo etc.     Depression    Difficulty standing 09/08/2022   Due to hip pain mainly- she agreed to physical therapy if insurance will cover   Diverticular disease 11/01/2022   On 2022 CT abd   Diverticulitis    Edema of both ankles    Elevated amylase 11/22/2022   Amylase                 Component    Value    Date/Time           AMYLASE    57    02/07/2023 1054        Family history of breast cancer 10/24/2017   Flank pain 11/03/2007    Urgent care told UTI responsible but she reports she didn't have one.  History of kidney stones and kind of feels like that  Started 3 weeks ago            Gallbladder problem    Gastric wall thickening 12/15/2022   11/2022 CT abdomen: Focal nodular wall thickening is seen at the GE junction and gastric fundus and gastric carcinoma cannot be excluded. Remainder of the stomach is unremarkable. No evidence of  obstruction, inflammatory process or abnormal fluid collections.  Diverticulosis is seen mainly involving the descending and sigmoid colon, however there is no evidence of diverticulitis.     Had esophagod   Gastritis    Generalized abdominal pain 12/15/2022   GERD (gastroesophageal reflux disease)    recently taken off anti reflux tx   Heart murmur 1954   Hepatitis    Hep. A- 1978   Hiatal hernia    History of stomach ulcers    Hypertension    Insomnia 02/17/2021  Flared with seeing sister summer 2024  Difficulty falling asleep and waking up  She snores but not aware of apnea-never had sleep study  She hasn't tried medication(s)  Getting about 4 hours nightly  No caffeine relationship  Doesn't seem related with arthritis  Is associated with racing thoughts.  Music and bible helps.         Intractable abdominal pain 12/15/2022   01/13/2023 interim history:   carafate , bentyl  (price ineffective), glycopyrrolate  (dry mouth) all stopped  Maalox helps, would like to dc proton pump inhibitor (PPI) stomach acid reducer.      Prior history  IMPRESSION:  Focal nodular wall thickening at the GE junction and gastric fundus.  Gastric carcinoma cannot be excluded. Consider upper endoscopy or  upper GI series for further evaluation.      Joint pain    Lactose intolerance    Lower extremity edema 01/11/2022   Lupus    Multiple gastric ulcers    Palpitations    Personal history of radiation therapy 2009   Prediabetes 09/08/2022   Lab Results Component Value Date  HGBA1C 5.7 (H) 06/29/2021      Rheumatic fever    Rheumatoid arthritis (HCC)    SLE (systemic lupus erythematosus related syndrome) (HCC)    Upper abdominal pain 11/01/2022   Radiates to the back and lower abdomen.has been present since late 2023 and we do not have a CAT scan yet for it.  X-ray abdomen showed only degenerative disc disease and chronic constipation.  Has visit planned with gastrointestinal specialist 01/04/23     Has been taking bentyl  and hydrocodone  and it helps  Has not been taking prescribed omeprazole  and carafate  due to pharmacist misunderstanding    Urinary incontinence    UTI (urinary tract infection)        Home Medications Prior to Admission medications   Medication Sig Start Date End Date Taking? Authorizing Provider  benzonatate  (TESSALON ) 100 MG capsule Take 1 capsule (100 mg total) by mouth 2 (two) times daily as needed for cough. 04/11/24   Jesus Bernardino MATSU, MD  busPIRone  (BUSPAR ) 15 MG tablet Take 1 tablet (15 mg total) by mouth 2 (two) times daily. 03/09/24   Jesus Bernardino MATSU, MD  celecoxib  (CELEBREX ) 50 MG capsule TAKE 1 CAPSULE TWICE DAILY. 09/29/23   Jesus Bernardino MATSU, MD  famotidine -calcium  carbonate-magnesium  hydroxide (PEPCID  COMPLETE) 10-800-165 MG chewable tablet CHEW 1 TABLET 2 TIMES DAILYAS NEEDED. 09/29/23   Jesus Bernardino MATSU, MD  fluticasone  (FLONASE ) 50 MCG/ACT nasal spray Place 2 sprays into both nostrils daily. 03/07/24   Jesus Bernardino MATSU, MD  hydroxychloroquine (PLAQUENIL) 200 MG tablet Take 200 mg by mouth 2 (two) times daily.    [provider]  lidocaine  (XYLOCAINE ) 2 % solution Use as directed 15 mLs in the mouth or throat every 3 (three) hours as needed for mouth pain (Throat pain; gargle; may spit or swallow). 03/01/24   Lucius Krabbe, NP  loratadine  (CLARITIN ) 10 MG tablet Take 1 tablet (10 mg total) by mouth daily. 03/07/24   Jesus Bernardino MATSU, MD  losartan -hydrochlorothiazide  (HYZAAR) 50-12.5 MG tablet Take 1 tablet by mouth daily. 10/31/23   Jesus Bernardino MATSU, MD   metoprolol  succinate (TOPROL -XL) 50 MG 24 hr tablet Take 1 tablet (50 mg total) by mouth daily. 05/02/23   Raford Riggs, MD  Multiple Vitamins-Minerals (EMERGEN-C IMMUNE PLUS PO) Take 1 tablet by mouth daily.    [provider]  nitrofurantoin , macrocrystal-monohydrate, (MACROBID ) 100 MG capsule Take 1 capsule (  100 mg total) by mouth 2 (two) times daily. 02/07/24   Wendolyn Jenkins Jansky, MD  omeprazole  (PRILOSEC) 40 MG capsule TAKE 1 CAPSULE DAILY 04/13/24   Jesus Bernardino MATSU, MD  potassium chloride  (KLOR-CON ) 10 MEQ tablet TAKE 1 TABLET BY MOUTH EVERY DAY Patient not taking: Reported on 03/07/2024 12/28/23   Jesus Bernardino MATSU, MD  promethazine -dextromethorphan (PROMETHAZINE -DM) 6.25-15 MG/5ML syrup Take 5 mLs by mouth 4 (four) times daily as needed for cough. 03/07/24   Jesus Bernardino MATSU, MD  Saline (SIMPLY SALINE) 0.9 % AERS Place 2 each into the nose as directed. Use nightly for sinus hygiene long-term.  Can also be used as many times daily as desired to assist with clearing congested sinuses. 03/07/24   Jesus Bernardino MATSU, MD      Allergies    Iodine, Iohexol , Latex, Penicillins, Celecoxib , Lactose, Other, Penicillin g, and Sulfa  antibiotics    Review of Systems   Review of Systems A 10 point review of systems was performed and is negative unless otherwise reported in HPI.  Physical Exam Updated Vital Signs BP 126/80   Pulse 62   Temp 98.5 F (36.9 C)   Resp 16   Ht 5' 7 (1.702 m)   Wt 83 kg   SpO2 100%   BMI 28.66 kg/m  Physical Exam General: Normal appearing female, lying in bed.  HEENT: PERRLA, Sclera anicteric, MMM, trachea midline.  Cardiology: RRR, no murmurs/rubs/gallops. BL radial and DP pulses equal bilaterally.  Resp: Normal respiratory rate and effort. CTAB, no wheezes, rhonchi, crackles.  Abd: Soft, non-tender, non-distended. No rebound tenderness or guarding.  GU: Deferred. MSK: No peripheral edema or signs of trauma. Extremities without deformity or TTP. No cyanosis  or clubbing. Skin: warm, dry. No rashes or lesions. Back: No CVA tenderness Neuro: A&Ox4, CNs II-XII grossly intact. MAEs. Sensation grossly intact.  Psych: Normal mood and affect.   ED Results / Procedures / Treatments   Labs (all labs ordered are listed, but only abnormal results are displayed) Labs Reviewed - No data to display  EKG None  Radiology DG Knee Complete 4 Views Left Result Date: 04/21/2024 EXAM: 4 or more VIEW(S) XRAY OF THE LEFT KNEE 04/21/2024 09:19:39 PM COMPARISON: 01/03/2022 CLINICAL HISTORY: Fall. Pt reports that she was walking backwards out of a bathroom while cleaning her church. She fell straight back and now reports L knee pain. H/x L knee replacement. Home brace present. FINDINGS: BONES AND JOINTS: Left total knee arthroplasty. No radiographic evidence of loosening. No acute fracture or dislocation. Unchanged small knee joint effusion. SOFT TISSUES: The soft tissues are unremarkable. IMPRESSION: 1. No acute fracture or dislocation. 2. Unchanged small knee joint effusion. Electronically signed by: Norman Gatlin MD 04/21/2024 09:25 PM EDT RP Workstation: [id number]    Procedures Procedures  {Document cardiac monitor, telemetry assessment procedure when appropriate:1}  Medications Ordered in ED Medications  morphine  (PF) 4 MG/ML injection 4 mg (has no administration in time range)    ED Course/ Medical Decision Making/ A&P                          Medical Decision Making Amount and/or Complexity of Data Reviewed Radiology: ordered.  Risk Prescription drug management.    This patient presents to the ED for concern of ***, this involves an extensive number of treatment options, and is a complaint that carries with it a high risk of complications and morbidity.  I considered the following differential and admission  for this acute, potentially life threatening condition.   MDM:    ***     Imaging Studies ordered: Left knee x-ray ordered from  triage.  I ordered imaging studies including CT scan left knee I independently visualized and interpreted imaging. I agree with the radiologist interpretation  Additional history obtained from chart review.  External records from outside source obtained and reviewed including ***  Cardiac Monitoring: The patient was maintained on a cardiac monitor.  I personally viewed and interpreted the cardiac monitored which showed an underlying rhythm of: ***  Reevaluation: After the interventions noted above, I reevaluated the patient and found that they have :{resolved/improved/worsened:23923::improved}  Social Determinants of Health: ***  Disposition:  ***  Co morbidities that complicate the patient evaluation  Past Medical History:  Diagnosis Date   Acute pain of right shoulder 11/01/2022   Allergy 1967   Anxiety    Aortic atherosclerosis (HCC) 11/01/2022   On 2022 CT abdomen.   Arthritis    R knee, hands    Back pain    Blood transfusion    1986- post-partial hysterectomy   Blood transfusion without reported diagnosis    Breast cancer (HCC) 2009   right lumpectomy and radiation   Cataract 12/2023   both eyes   Chicken pox    Colon cancer (HCC) 2003   per pt report, cancer found in a colon polyp in 2003, procedure done in [place].  did not require surgery, chemo etc.     Depression    Difficulty standing 09/08/2022   Due to hip pain mainly- she agreed to physical therapy if insurance will cover   Diverticular disease 11/01/2022   On 2022 CT abd   Diverticulitis    Edema of both ankles    Elevated amylase 11/22/2022   Amylase                 Component    Value    Date/Time           AMYLASE    57    02/07/2023 1054        Family history of breast cancer 10/24/2017   Flank pain 11/03/2007   Urgent care told UTI responsible but she reports she didn't have one.  History of kidney stones and kind of feels like that  Started 3 weeks ago            Gallbladder problem    Gastric  wall thickening 12/15/2022   11/2022 CT abdomen: Focal nodular wall thickening is seen at the GE junction and gastric fundus and gastric carcinoma cannot be excluded. Remainder of the stomach is unremarkable. No evidence of  obstruction, inflammatory process or abnormal fluid collections.  Diverticulosis is seen mainly involving the descending and sigmoid colon, however there is no evidence of diverticulitis.     Had esophagod   Gastritis    Generalized abdominal pain 12/15/2022   GERD (gastroesophageal reflux disease)    recently taken off anti reflux tx   Heart murmur 1954   Hepatitis    Hep. A- 1978   Hiatal hernia    History of stomach ulcers    Hypertension    Insomnia 02/17/2021   Flared with seeing sister summer 2024  Difficulty falling asleep and waking up  She snores but not aware of apnea-never had sleep study  She hasn't tried medication(s)  Getting about 4 hours nightly  No caffeine relationship  Doesn't seem related with arthritis  Is associated with racing  thoughts.  Music and bible helps.         Intractable abdominal pain 12/15/2022   01/13/2023 interim history:   carafate , bentyl  (price ineffective), glycopyrrolate  (dry mouth) all stopped  Maalox helps, would like to dc proton pump inhibitor (PPI) stomach acid reducer.      Prior history  IMPRESSION:  Focal nodular wall thickening at the GE junction and gastric fundus.  Gastric carcinoma cannot be excluded. Consider upper endoscopy or  upper GI series for further evaluation.      Joint pain    Lactose intolerance    Lower extremity edema 01/11/2022   Lupus    Multiple gastric ulcers    Palpitations    Personal history of radiation therapy 2009   Prediabetes 09/08/2022   Lab Results Component Value Date  HGBA1C 5.7 (H) 06/29/2021     Rheumatic fever    Rheumatoid arthritis (HCC)    SLE (systemic lupus erythematosus related syndrome) (HCC)    Upper abdominal pain 11/01/2022   Radiates to the back and lower abdomen.has  been present since late 2023 and we do not have a CAT scan yet for it.  X-ray abdomen showed only degenerative disc disease and chronic constipation.  Has visit planned with gastrointestinal specialist 01/04/23     Has been taking bentyl  and hydrocodone  and it helps  Has not been taking prescribed omeprazole  and carafate  due to pharmacist misunderstanding    Urinary incontinence    UTI (urinary tract infection)      Medicines Meds ordered this encounter  Medications   morphine  (PF) 4 MG/ML injection 4 mg    I have reviewed the patients home medicines and have made adjustments as needed  Problem List / ED Course: Problem List Items Addressed This Visit   None        {Document critical care time when appropriate:1} {Document review of labs and clinical decision tools ie heart score, Chads2Vasc2 etc:1}  {Document your independent review of radiology images, and any outside records:1} {Document your discussion with family members, caretakers, and with consultants:1} {Document social determinants of health affecting pt's care:1} {Document your decision making why or why not admission, treatments were needed:1}  This note was created using dictation software, which may contain spelling or grammatical errors.

## 2024-04-22 MED ORDER — KETOROLAC TROMETHAMINE 15 MG/ML IJ SOLN: 15.0000 mg | Freq: Once | INTRAMUSCULAR | Status: DC

## 2024-04-22 MED ORDER — OXYCODONE-ACETAMINOPHEN 5-325 MG PO TABS
1.0000 | ORAL_TABLET | Freq: Once | ORAL | Status: AC
  Administered 2024-04-22: 1 via ORAL
  Filled 2024-04-22: qty 1

## 2024-04-22 MED ORDER — HYDROCODONE-ACETAMINOPHEN 5-325 MG PO TABS
2.0000 | ORAL_TABLET | ORAL | 0 refills | Status: DC | PRN
Start: 2024-04-22 — End: 2024-06-08

## 2024-04-22 MED ORDER — KETOROLAC TROMETHAMINE 15 MG/ML IJ SOLN
15.0000 mg | Freq: Once | INTRAMUSCULAR | Status: AC
  Administered 2024-04-22: 15 mg via INTRAVENOUS
  Filled 2024-04-22: qty 1

## 2024-04-22 MED ORDER — NAPROXEN 375 MG PO TABS: 375.0000 mg | ORAL_TABLET | Freq: Two times a day (BID) | ORAL | 0 refills | Status: DC

## 2024-04-22 NOTE — ED Notes (Signed)
 Discharge instructions reviewed with patient. Patient questions answered and opportunity for education reviewed. Patient voices understanding of discharge instructions with no further questions.

## 2024-04-22 NOTE — ED Provider Notes (Signed)
  Physical Exam  BP 135/85   Pulse 60   Temp 98.4 F (36.9 C) (Oral)   Resp 18   Ht 5' 7 (1.702 m)   Wt 83 kg   SpO2 98%   BMI 28.66 kg/m   Physical Exam Vitals and nursing note reviewed.  Constitutional:      General: She is not in acute distress.    Appearance: She is well-developed.  HENT:     Head: Normocephalic and atraumatic.  Eyes:     Conjunctiva/sclera: Conjunctivae normal.  Cardiovascular:     Rate and Rhythm: Normal rate and regular rhythm.     Heart sounds: No murmur heard. Pulmonary:     Effort: Pulmonary effort is normal. No respiratory distress.     Breath sounds: Normal breath sounds.  Abdominal:     Palpations: Abdomen is soft.     Tenderness: There is no abdominal tenderness.  Musculoskeletal:        General: Swelling and tenderness present.     Cervical back: Neck supple.  Skin:    General: Skin is warm and dry.     Capillary Refill: Capillary refill takes less than 2 seconds.  Neurological:     Mental Status: She is alert.  Psychiatric:        Mood and Affect: Mood normal.     Procedures  Procedures  ED Course / MDM    Medical Decision Making Amount and/or Complexity of Data Reviewed Radiology: ordered.  Risk Prescription drug management.   Patient received in handoff.  Left knee pain after a fall.  Pending CT of the knee to rule out occult fracture.  CT is reassuringly negative.  Patient pain controlled and placed in a knee immobilizer and given crutches for ambulation.  Will follow-up outpatient with orthopedics.  Does not meet inpatient criteria for admission.       Albertina Dixon, MD 04/22/24 [phone]

## 2024-04-24 ENCOUNTER — Telehealth: Payer: Self-pay

## 2024-04-24 NOTE — Telephone Encounter (Signed)
 Transition Care Management Follow-up Telephone Call Date of discharge and from where: 04/22/2024 [name] How have you been since you were released from the hospital? Same Any questions or concerns? No  Items Reviewed: Did the pt receive and understand the discharge instructions provided? Yes  Medications obtained and verified? No  Other?   Any new allergies since your discharge? No  Dietary orders reviewed? No Do you have support at home? Yes   Home Care and Equipment/Supplies: Were home health services ordered? no If so, what is the name of the agency?   Has the agency set up a time to come to the patient's home? not applicable Were any new equipment or medical supplies ordered?  No What is the name of the medical supply agency?  Were you able to get the supplies/equipment? not applicable Do you have any questions related to the use of the equipment or supplies? No  Functional Questionnaire: (I = Independent and D = Dependent) ADLs: I  Bathing/Dressing- I  Meal Prep- I  Eating- I  Maintaining continence- I  Transferring/Ambulation- I  Managing Meds- I  Follow up appointments reviewed:  PCP Hospital f/u appt confirmed? Yes  Scheduled to see Dr Jesus on 05/01/24 @ 11:20am. Specialist Hospital f/u appt confirmed? Yes  Scheduled to see [facility] on 04/26/24  Are transportation arrangements needed? No  If their condition worsens, is the pt aware to call PCP or go to the Emergency Dept.? Yes Was the patient provided with contact information for the PCP's office or ED? Yes Was to pt encouraged to call back with questions or concerns? Yes

## 2024-04-28 ENCOUNTER — Other Ambulatory Visit (HOSPITAL_BASED_OUTPATIENT_CLINIC_OR_DEPARTMENT_OTHER): Payer: Self-pay | Admitting: Cardiovascular Disease

## 2024-04-28 DIAGNOSIS — R002 Palpitations: Secondary | ICD-10-CM

## 2024-04-28 DIAGNOSIS — I10 Essential (primary) hypertension: Secondary | ICD-10-CM

## 2024-05-01 ENCOUNTER — Encounter: Payer: Self-pay | Admitting: Internal Medicine

## 2024-05-01 ENCOUNTER — Ambulatory Visit: Admitting: Internal Medicine

## 2024-05-01 VITALS — BP 136/76 | HR 81 | Temp 97.2°F | Ht 67.0 in | Wt 187.6 lb

## 2024-05-01 DIAGNOSIS — R3129 Other microscopic hematuria: Secondary | ICD-10-CM

## 2024-05-01 DIAGNOSIS — I10 Essential (primary) hypertension: Secondary | ICD-10-CM | POA: Diagnosis not present

## 2024-05-01 DIAGNOSIS — U071 COVID-19: Secondary | ICD-10-CM | POA: Diagnosis not present

## 2024-05-01 DIAGNOSIS — N182 Chronic kidney disease, stage 2 (mild): Secondary | ICD-10-CM

## 2024-05-01 DIAGNOSIS — M3219 Other organ or system involvement in systemic lupus erythematosus: Secondary | ICD-10-CM | POA: Diagnosis not present

## 2024-05-01 DIAGNOSIS — M069 Rheumatoid arthritis, unspecified: Secondary | ICD-10-CM

## 2024-05-01 DIAGNOSIS — R350 Frequency of micturition: Secondary | ICD-10-CM | POA: Insufficient documentation

## 2024-05-01 DIAGNOSIS — R5383 Other fatigue: Secondary | ICD-10-CM | POA: Insufficient documentation

## 2024-05-01 DIAGNOSIS — R002 Palpitations: Secondary | ICD-10-CM | POA: Diagnosis not present

## 2024-05-01 DIAGNOSIS — F418 Other specified anxiety disorders: Secondary | ICD-10-CM | POA: Diagnosis not present

## 2024-05-01 DIAGNOSIS — R809 Proteinuria, unspecified: Secondary | ICD-10-CM

## 2024-05-01 DIAGNOSIS — D709 Neutropenia, unspecified: Secondary | ICD-10-CM | POA: Diagnosis not present

## 2024-05-01 LAB — SEDIMENTATION RATE: Sed Rate: 19 mm/h (ref 0–30)

## 2024-05-01 LAB — CBC WITH DIFFERENTIAL/PLATELET
Basophils Absolute: 0 K/uL (ref 0.0–0.1)
Basophils Relative: 0.6 % (ref 0.0–3.0)
Eosinophils Absolute: 0.2 K/uL (ref 0.0–0.7)
Eosinophils Relative: 4.1 % (ref 0.0–5.0)
HCT: 36.7 % (ref 36.0–46.0)
Hemoglobin: 12.3 g/dL (ref 12.0–15.0)
Lymphocytes Relative: 36.9 % (ref 12.0–46.0)
Lymphs Abs: 1.5 K/uL (ref 0.7–4.0)
MCHC: 33.6 g/dL (ref 30.0–36.0)
MCV: 93.1 fl (ref 78.0–100.0)
Monocytes Absolute: 0.7 K/uL (ref 0.1–1.0)
Monocytes Relative: 17.8 % — ABNORMAL HIGH (ref 3.0–12.0)
Neutro Abs: 1.7 K/uL (ref 1.4–7.7)
Neutrophils Relative %: 40.6 % — ABNORMAL LOW (ref 43.0–77.0)
Platelets: 205 K/uL (ref 150.0–400.0)
RBC: 3.94 Mil/uL (ref 3.87–5.11)
RDW: 13.8 % (ref 11.5–15.5)
WBC: 4.1 K/uL (ref 4.0–10.5)

## 2024-05-01 LAB — COMPREHENSIVE METABOLIC PANEL WITH GFR
ALT: 15 U/L (ref 0–35)
AST: 15 U/L (ref 0–37)
Albumin: 3.7 g/dL (ref 3.5–5.2)
Alkaline Phosphatase: 88 U/L (ref 39–117)
BUN: 11 mg/dL (ref 6–23)
CO2: 29 meq/L (ref 19–32)
Calcium: 9.3 mg/dL (ref 8.4–10.5)
Chloride: 105 meq/L (ref 96–112)
Creatinine, Ser: 0.67 mg/dL (ref 0.40–1.20)
GFR: 85.63 mL/min (ref >60.00)
Glucose, Bld: 86 mg/dL (ref 70–99)
Potassium: 3.7 meq/L (ref 3.5–5.1)
Sodium: 140 meq/L (ref 135–145)
Total Bilirubin: 0.4 mg/dL (ref 0.2–1.2)
Total Protein: 6.8 g/dL (ref 6.0–8.3)

## 2024-05-01 LAB — C-REACTIVE PROTEIN: CRP: <1 mg/dL (ref 0.5–20.0)

## 2024-05-01 LAB — VITAMIN D 25 HYDROXY (VIT D DEFICIENCY, FRACTURES): VITD: 28.39 ng/mL — ABNORMAL LOW (ref 30.00–100.00)

## 2024-05-01 LAB — B12 AND FOLATE PANEL
Folate: 17.5 ng/mL (ref >5.9)
Vitamin B-12: 311 pg/mL (ref 211–911)

## 2024-05-01 LAB — POC COVID19 BINAXNOW: SARS Coronavirus 2 Ag: NEGATIVE

## 2024-05-01 MED ORDER — BUSPIRONE HCL 15 MG PO TABS: 15.0000 mg | ORAL_TABLET | Freq: Two times a day (BID) | ORAL | 3 refills | Status: DC

## 2024-05-01 MED ORDER — LOSARTAN POTASSIUM-HCTZ 50-12.5 MG PO TABS
1.0000 | ORAL_TABLET | Freq: Every day | ORAL | 3 refills | Status: DC
Start: 2024-05-01 — End: 2024-06-08

## 2024-05-01 MED ORDER — BUSPIRONE HCL 10 MG PO TABS: 20.0000 mg | ORAL_TABLET | Freq: Two times a day (BID) | ORAL | 0 refills | Status: DC

## 2024-05-01 MED ORDER — METOPROLOL SUCCINATE ER 50 MG PO TB24: 50.0000 mg | ORAL_TABLET | Freq: Every day | ORAL | 1 refills | Status: AC

## 2024-05-01 NOTE — Assessment & Plan Note (Signed)
 Fatigue and drowsiness   She reports two months of fatigue and mild drowsiness, likely due to insufficient sleep and anxiety. A lupus flare is unlikely, but a nutritional deficiency from a lack of green vegetables is possible. Order CBC, CMP, ESR, CRP, thyroid  panel, urinalysis, iron studies, vitamin D , complement levels, B12, folate, and non-fasting A1c. Recommend Centrum Silver multivitamin with minerals. Share lab results with rheumatologist Jon Learn.

## 2024-05-01 NOTE — Assessment & Plan Note (Signed)
 Will order lab testing to guide management. Share with rheumatology

## 2024-05-01 NOTE — Assessment & Plan Note (Signed)
 She experienced a recent lupus flare with fever, facial rash, and joint pain, managed with Celebrex . Continue hydroxychloroquine with regular eye exams. No steroids are recommended by the rheumatologist. Continue Celebrex  for joint pain and refill hydrocodone  for severe pain episodes.

## 2024-05-01 NOTE — Assessment & Plan Note (Signed)
 Her blood pressure is 136/76, well-controlled with metoprolol  and losartan -hydrochlorothiazide . Refill both medications.

## 2024-05-01 NOTE — Progress Notes (Signed)
 ==============================  [facility]: [phone]   -- Medical Office Visit --  Patient: [name]      Age: 73 y.o.       Sex:  female  Date:   05/01/2024 Today's Healthcare Provider: Bernardino KANDICE Cone, MD  ==============================   Chief Complaint: Hospitalization Follow-up   Discussed the use of AI scribe software for clinical note transcription with the patient, who gave verbal consent to proceed.  History of Present Illness [name] is a 73 year old female with lupus and rheumatoid arthritis who presents with fatigue and mild drowsiness.  She experienced a fall at church on Sunday before last, slipping on a wet floor and hyperextending her left knee. This incident broke the scar tissue from a previous knee replacement surgery, allowing her knee to bend more freely. An orthopedic doctor drained blood from her knee and administered a cortisone shot for pain relief. She is currently using a knee brace.  She experienced a lupus flare-up characterized by elevated temperature, facial rash, and severe joint pain, which typically occur in the summer. She manages her lupus with Celebrex , which controls her joint pain, and hydroxychloroquine, which requires regular eye exams. She does not use steroids for her lupus flares.  She reports two months of fatigue and mild drowsiness, which she attributes to not getting enough sleep. No lack of motivation or muscle weakness. She has not been sleeping eight hours since having children and attributes some of her fatigue to anxiety related to caring for a family member. She is concerned about her nutrition, noting a lack of green vegetables in her diet, which she believes may contribute to her fatigue.  She has a history of rheumatoid arthritis and osteoporosis. She takes Celebrex  for joint pain and Pepcid  for stomach protection. She also takes a multivitamin, Plaquenil, and buspirone  for  anxiety. She is sexually active and has been with her partner for four years.  She mentions a family history of memory impairment in a family member who lost a child to COVID-19.  has Malignant neoplasm of upper-outer quadrant of right breast in female, estrogen receptor positive (HCC); HTN (hypertension); Hiatal hernia; Systemic lupus erythematosus (HCC); Hypokalemia; Personal history of colon cancer; History of breast cancer; Family history of coronary artery disease in father; Hyperlipidemia with target LDL less than 130; LVH (left ventricular hypertrophy) due to hypertensive disease, without heart failure; Weight disorder; Primary localized osteoarthrosis of multiple sites; Rheumatoid arthritis (HCC); GERD (gastroesophageal reflux disease); Difficulty standing; Hand arthritis; Aortic atherosclerosis (HCC); Diverticular disease; Chronic idiopathic constipation; DDD (degenerative disc disease), thoracic; Abnormal finding on imaging of liver; Chronic kidney disease, stage 2 (mild); Diastolic dysfunction; NSVT (nonsustained ventricular tachycardia) (HCC); Adjustment disorder; Chronic pain of left knee; Other specified anxiety disorders; Other microscopic hematuria; Neutropenia (HCC); Proteinuria; Frequent urination; and Other fatigue on their problem list.  Updated Problem List Entries: Problem  Frequent Urination  Other Fatigue  Htn (Hypertension)   Mild LVH on echo Oct 2019 As of 05/01/2024 Current hypertension medications:       Sig   losartan -hydrochlorothiazide  (HYZAAR) 50-12.5 MG tablet Take 1 tablet by mouth daily.   metoprolol  succinate (TOPROL -XL) 50 MG 24 hr tablet TAKE 1 TABLET BY MOUTH EVERY DAY      BP Readings from Last 10 Encounters:  05/01/24 136/76  04/22/24 135/85  04/09/24 (!) 195/110  03/09/24 136/76  03/07/24 (!) 150/86  02/07/24 138/72  01/05/24 118/63  10/31/23 (!) 163/79  09/28/23 (!) 146/82  08/12/23 124/78       Systemic Lupus Erythematosus (Hcc)          Background Reviewed: Problem List: has Malignant neoplasm of upper-outer quadrant of right breast in female, estrogen receptor positive (HCC); HTN (hypertension); Hiatal hernia; Systemic lupus erythematosus (HCC); Hypokalemia; Personal history of colon cancer; History of breast cancer; Family history of coronary artery disease in father; Hyperlipidemia with target LDL less than 130; LVH (left ventricular hypertrophy) due to hypertensive disease, without heart failure; Weight disorder; Primary localized osteoarthrosis of multiple sites; Rheumatoid arthritis (HCC); GERD (gastroesophageal reflux disease); Difficulty standing; Hand arthritis; Aortic atherosclerosis (HCC); Diverticular disease; Chronic idiopathic constipation; DDD (degenerative disc disease), thoracic; Abnormal finding on imaging of liver; Chronic kidney disease, stage 2 (mild); Diastolic dysfunction; NSVT (nonsustained ventricular tachycardia) (HCC); Adjustment disorder; Chronic pain of left knee; Other specified anxiety disorders; Other microscopic hematuria; Neutropenia (HCC); Proteinuria; Frequent urination; and Other fatigue on their problem list. Past Medical History:  has a past medical history of Acute pain of right shoulder (11/01/2022), Allergy (1967), Anxiety, Aortic atherosclerosis (HCC) (11/01/2022), Arthritis, Back pain, Blood transfusion, Blood transfusion without reported diagnosis, Breast cancer (HCC) (2009), Cataract (12/2023), Chicken pox, Colon cancer (HCC) (2003), Depression, Difficulty standing (09/08/2022), Diverticular disease (11/01/2022), Diverticulitis, Edema of both ankles, Elevated amylase (11/22/2022), Family history of breast cancer (10/24/2017), Flank pain (11/03/2007), Gallbladder problem, Gastric wall thickening (12/15/2022), Gastritis, Generalized abdominal pain (12/15/2022), GERD (gastroesophageal reflux disease), Heart murmur (1954), Hepatitis, Hiatal hernia, History of stomach ulcers, Hypertension, Insomnia  (02/17/2021), Intractable abdominal pain (12/15/2022), Joint pain, Lactose intolerance, Lower extremity edema (01/11/2022), Lupus, Multiple gastric ulcers, Palpitations, Personal history of radiation therapy (2009), Prediabetes (09/08/2022), Rheumatic fever, Rheumatoid arthritis (HCC), SLE (systemic lupus erythematosus related syndrome) (HCC), Upper abdominal pain (11/01/2022), Urinary incontinence, and UTI (urinary tract infection). Past Surgical History:   has a past surgical history that includes Tonsillectomy; Appendectomy; Abdominal hysterectomy; Tubal ligation; Breast surgery; Cholecystectomy; Total knee arthroplasty (08/30/2011); Replacement total knee (Left); Esophagogastroduodenoscopy (egd) with propofol  (N/A, 07/25/2018); biopsy (07/25/2018); Breast excisional biopsy (Left, 1998); Breast lumpectomy (Bilateral); and Joint replacement (2006, 2013). Social History:   reports that she has never smoked. She has never used smokeless tobacco. She reports that she does not drink alcohol  and does not use drugs. Family History:  family history includes Arthritis in her maternal grandmother; Breast cancer (age of onset: 67) in her maternal aunt; Breast cancer (age of onset: 76) in her mother; Cancer in her mother and another family member; Cancer (age of onset: 29) in her paternal aunt; Cancer (age of onset: 47) in her paternal grandmother; Depression in her mother; Heart attack in her father; Heart disease in her father; Hypertension in her brother, mother, sister, sister, and another family member; Kidney disease in her brother; Other in her father; Other (age of onset: 64) in her maternal grandmother; Stroke in her mother; Sudden death in her father. Allergies:  is allergic to iodine, iohexol , latex, penicillins, celecoxib , lactose, other, penicillin g, and sulfa  antibiotics.   Medication Reconciliation: Current Outpatient Medications on File Prior to Visit  Medication Sig   celecoxib  (CELEBREX ) 50 MG  capsule TAKE 1 CAPSULE TWICE DAILY.   famotidine -calcium  carbonate-magnesium  hydroxide (PEPCID  COMPLETE) 10-800-165 MG chewable tablet CHEW 1 TABLET 2 TIMES DAILYAS NEEDED.   HYDROcodone -acetaminophen  (NORCO/VICODIN) 5-325 MG tablet Take 2 tablets by mouth every 4 (four) hours as needed.   hydroxychloroquine (PLAQUENIL) 200 MG tablet Take 200 mg by mouth 2 (two) times daily.   Multiple Vitamins-Minerals (EMERGEN-C IMMUNE PLUS PO) Take  1 tablet by mouth daily.   No current facility-administered medications on file prior to visit.   Medications Discontinued During This Encounter  Medication Reason   potassium chloride  (KLOR-CON ) 10 MEQ tablet    nitrofurantoin , macrocrystal-monohydrate, (MACROBID ) 100 MG capsule    lidocaine  (XYLOCAINE ) 2 % solution    fluticasone  (FLONASE ) 50 MCG/ACT nasal spray    Saline (SIMPLY SALINE) 0.9 % AERS    loratadine  (CLARITIN ) 10 MG tablet    promethazine -dextromethorphan (PROMETHAZINE -DM) 6.25-15 MG/5ML syrup    benzonatate  (TESSALON ) 100 MG capsule    omeprazole  (PRILOSEC) 40 MG capsule    naproxen  (NAPROSYN ) 375 MG tablet    losartan -hydrochlorothiazide  (HYZAAR) 50-12.5 MG tablet Reorder   busPIRone  (BUSPAR ) 15 MG tablet Reorder   metoprolol  succinate (TOPROL -XL) 50 MG 24 hr tablet Reorder   busPIRone  (BUSPAR ) 15 MG tablet Dose change     Physical Exam:    05/01/2024   11:34 AM 05/01/2024   10:54 AM 04/22/2024    2:55 AM  Vitals with BMI  Height  5' 7   Weight  187 lbs 10 oz   BMI  29.38   Systolic 136 140 864  Diastolic 76 80 85  Pulse  81 60  Vital signs reviewed.  Nursing notes reviewed. Weight trend reviewed. Physical Exam General Appearance:  No acute distress appreciable.   Well-groomed, healthy-appearing female.  Well proportioned with no abnormal fat distribution.  Good muscle tone. Pulmonary:  Normal work of breathing at rest, no respiratory distress apparent. SpO2: 96 %  Musculoskeletal: All extremities are intact.  Neurological:   Awake, alert, oriented, and engaged.  No obvious focal neurological deficits or cognitive impairments.  Sensorium seems unclouded.   Speech is clear and coherent with logical content. Psychiatric:  Appropriate mood, pleasant and cooperative demeanor, thoughtful and engaged during the exam  Results:    02/07/2024    3:01 PM 01/24/2024    8:15 AM 10/31/2023   10:43 AM 06/07/2023   10:52 AM  PHQ 2/9 Scores  PHQ - 2 Score 0 0 0 0  PHQ- 9 Score 0   3   Results RADIOLOGY CT of the knee: Normal    Results for orders placed or performed in visit on 05/01/24  CBC with Differential/Platelet  Result Value Ref Range   WBC 4.1 4.0 - 10.5 K/uL   RBC 3.94 3.87 - 5.11 Mil/uL   Hemoglobin 12.3 12.0 - 15.0 g/dL   HCT 63.2 63.9 - 53.9 %   MCV 93.1 78.0 - 100.0 fl   MCHC 33.6 30.0 - 36.0 g/dL   RDW 86.1 88.4 - 84.4 %   Platelets 205.0 150.0 - 400.0 K/uL   Neutrophils Relative % 40.6 (L) 43.0 - 77.0 %   Lymphocytes Relative 36.9 12.0 - 46.0 %   Monocytes Relative 17.8 (H) 3.0 - 12.0 %   Eosinophils Relative 4.1 0.0 - 5.0 %   Basophils Relative 0.6 0.0 - 3.0 %   Neutro Abs 1.7 1.4 - 7.7 K/uL   Lymphs Abs 1.5 0.7 - 4.0 K/uL   Monocytes Absolute 0.7 0.1 - 1.0 K/uL   Eosinophils Absolute 0.2 0.0 - 0.7 K/uL   Basophils Absolute 0.0 0.0 - 0.1 K/uL  Comp Met (CMET)  Result Value Ref Range   Sodium 140 135 - 145 mEq/L   Potassium 3.7 3.5 - 5.1 mEq/L   Chloride 105 96 - 112 mEq/L   CO2 29 19 - 32 mEq/L   Glucose, Bld 86 70 - 99 mg/dL  BUN 11 6 - 23 mg/dL   Creatinine, Ser 9.32 0.40 - 1.20 mg/dL   Total Bilirubin 0.4 0.2 - 1.2 mg/dL   Alkaline Phosphatase 88 39 - 117 U/L   AST 15 0 - 37 U/L   ALT 15 0 - 35 U/L   Total Protein 6.8 6.0 - 8.3 g/dL   Albumin  3.7 3.5 - 5.2 g/dL   GFR 14.36 >39.99 mL/min   Calcium  9.3 8.4 - 10.5 mg/dL  Sedimentation rate  Result Value Ref Range   Sed Rate 19 0 - 30 mm/hr  C-reactive protein  Result Value Ref Range   CRP <1.0 0.5 - 20.0 mg/dL  A87 and Folate  Panel  Result Value Ref Range   Vitamin B-12 311 211 - 911 pg/mL   Folate 17.5 >5.9 ng/mL  Vitamin D  (25 hydroxy)  Result Value Ref Range   VITD 28.39 (L) 30.00 - 100.00 ng/mL  POC COVID-19 BinaxNow  Result Value Ref Range   SARS Coronavirus 2 Ag Negative Negative   Office Visit on 05/01/2024  Component Date Value Ref Range Status   SARS Coronavirus 2 Ag 05/01/2024 Negative  Negative Final   WBC 05/01/2024 4.1  4.0 - 10.5 K/uL Final   RBC 05/01/2024 3.94  3.87 - 5.11 Mil/uL Final   Hemoglobin 05/01/2024 12.3  12.0 - 15.0 g/dL Final   HCT 91/87/7974 36.7  36.0 - 46.0 % Final   MCV 05/01/2024 93.1  78.0 - 100.0 fl Final   MCHC 05/01/2024 33.6  30.0 - 36.0 g/dL Final   RDW 91/87/7974 13.8  11.5 - 15.5 % Final   Platelets 05/01/2024 205.0  150.0 - 400.0 K/uL Final   Neutrophils Relative % 05/01/2024 40.6 (L)  43.0 - 77.0 % Final   Lymphocytes Relative 05/01/2024 36.9  12.0 - 46.0 % Final   Monocytes Relative 05/01/2024 17.8 (H)  3.0 - 12.0 % Final   Eosinophils Relative 05/01/2024 4.1  0.0 - 5.0 % Final   Basophils Relative 05/01/2024 0.6  0.0 - 3.0 % Final   Neutro Abs 05/01/2024 1.7  1.4 - 7.7 K/uL Final   Lymphs Abs 05/01/2024 1.5  0.7 - 4.0 K/uL Final   Monocytes Absolute 05/01/2024 0.7  0.1 - 1.0 K/uL Final   Eosinophils Absolute 05/01/2024 0.2  0.0 - 0.7 K/uL Final   Basophils Absolute 05/01/2024 0.0  0.0 - 0.1 K/uL Final   Sodium 05/01/2024 140  135 - 145 mEq/L Final   Potassium 05/01/2024 3.7  3.5 - 5.1 mEq/L Final   Chloride 05/01/2024 105  96 - 112 mEq/L Final   CO2 05/01/2024 29  19 - 32 mEq/L Final   Glucose, Bld 05/01/2024 86  70 - 99 mg/dL Final   BUN 91/87/7974 11  6 - 23 mg/dL Final   Creatinine, Ser 05/01/2024 0.67  0.40 - 1.20 mg/dL Final   Total Bilirubin 05/01/2024 0.4  0.2 - 1.2 mg/dL Final   Alkaline Phosphatase 05/01/2024 88  39 - 117 U/L Final   AST 05/01/2024 15  0 - 37 U/L Final   ALT 05/01/2024 15  0 - 35 U/L Final   Total Protein 05/01/2024 6.8  6.0  - 8.3 g/dL Final   Albumin  05/01/2024 3.7  3.5 - 5.2 g/dL Final   GFR 91/87/7974 85.63  >60.00 mL/min Final   Calcium  05/01/2024 9.3  8.4 - 10.5 mg/dL Final   Sed Rate 91/87/7974 19  0 - 30 mm/hr Final   CRP 05/01/2024 <1.0  0.5 - 20.0  mg/dL Final   Vitamin A-87 91/87/7974 311  211 - 911 pg/mL Final   Folate 05/01/2024 17.5  >5.9 ng/mL Final   VITD 05/01/2024 28.39 (L)  30.00 - 100.00 ng/mL Final  Admission on 04/09/2024, Discharged on 04/09/2024  Component Date Value Ref Range Status   Sodium 04/09/2024 139  135 - 145 mmol/L Final   Potassium 04/09/2024 3.9  3.5 - 5.1 mmol/L Final   Chloride 04/09/2024 103  98 - 111 mmol/L Final   CO2 04/09/2024 24  22 - 32 mmol/L Final   Glucose, Bld 04/09/2024 94  70 - 99 mg/dL Final   BUN 92/78/7974 8  8 - 23 mg/dL Final   Creatinine, Ser 04/09/2024 0.77  0.44 - 1.00 mg/dL Final   Calcium  04/09/2024 9.6  8.9 - 10.3 mg/dL Final   GFR, Estimated 04/09/2024 >60  >60 mL/min Final   Anion gap 04/09/2024 12  5 - 15 Final   WBC 04/09/2024 4.7  4.0 - 10.5 K/uL Final   RBC 04/09/2024 3.80 (L)  3.87 - 5.11 MIL/uL Final   Hemoglobin 04/09/2024 11.9 (L)  12.0 - 15.0 g/dL Final   HCT 92/78/7974 35.6 (L)  36.0 - 46.0 % Final   MCV 04/09/2024 93.7  80.0 - 100.0 fL Final   MCH 04/09/2024 31.3  26.0 - 34.0 pg Final   MCHC 04/09/2024 33.4  30.0 - 36.0 g/dL Final   RDW 92/78/7974 12.9  11.5 - 15.5 % Final   Platelets 04/09/2024 185  150 - 400 K/uL Final   nRBC 04/09/2024 0.0  0.0 - 0.2 % Final   Troponin T High Sensitivity 04/09/2024 <15  <19 ng/L Final   Total Protein 04/09/2024 7.1  6.5 - 8.1 g/dL Final   Albumin  04/09/2024 4.2  3.5 - 5.0 g/dL Final   AST 92/78/7974 28  15 - 41 U/L Final   ALT 04/09/2024 17  0 - 44 U/L Final   Alkaline Phosphatase 04/09/2024 143 (H)  38 - 126 U/L Final   Total Bilirubin 04/09/2024 0.3  0.0 - 1.2 mg/dL Final   Bilirubin, Direct 04/09/2024 0.1  0.0 - 0.2 mg/dL Final   Indirect Bilirubin 04/09/2024 0.2 (L)  0.3 - 0.9 mg/dL  Final   Lipase 92/78/7974 16  11 - 51 U/L Final   SARS Coronavirus 2 by RT PCR 04/09/2024 POSITIVE (A)  NEGATIVE Final   Influenza A by PCR 04/09/2024 NEGATIVE  NEGATIVE Final   Influenza B by PCR 04/09/2024 NEGATIVE  NEGATIVE Final   Resp Syncytial Virus by PCR 04/09/2024 NEGATIVE  NEGATIVE Final  Office Visit on 03/09/2024  Component Date Value Ref Range Status   Creatinine, Urine 03/09/2024 215  20 - 275 mg/dL Final   Protein/Creat Ratio 03/09/2024 88  24 - 184 mg/g creat Final   Protein/Creatinine Ratio 03/09/2024 0.088  0.024 - 0.184 mg/mg creat Final   Total Protein, Urine 03/09/2024 19  5 - 24 mg/dL Final   Color, Urine 93/79/7974 YELLOW  YELLOW Final   APPearance 03/09/2024 CLEAR  CLEAR Final   Specific Gravity, Urine 03/09/2024 1.024  1.001 - 1.035 Final   pH 03/09/2024 6.0  5.0 - 8.0 Final   Glucose, UA 03/09/2024 NEGATIVE  NEGATIVE Final   Bilirubin Urine 03/09/2024 NEGATIVE  NEGATIVE Final   Ketones, ur 03/09/2024 TRACE (A)  NEGATIVE Final   Hgb urine dipstick 03/09/2024 NEGATIVE  NEGATIVE Final   Protein, ur 03/09/2024 1+ (A)  NEGATIVE Final   Nitrites, Initial 03/09/2024 NEGATIVE  NEGATIVE Final  Leukocyte Esterase 03/09/2024 NEGATIVE  NEGATIVE Final   WBC, UA 03/09/2024 0-5  0 - 5 /HPF Final   RBC / HPF 03/09/2024 0-2  0 - 2 /HPF Final   Squamous Epithelial / HPF 03/09/2024 0-5  < OR = 5 /HPF Final   Bacteria, UA 03/09/2024 FEW (A)  NONE SEEN /HPF Final   Hyaline Cast 03/09/2024 NONE SEEN  NONE SEEN /LPF Final   Note 03/09/2024    Final   Reflexve Urine Culture 03/09/2024    Final  Office Visit on 03/07/2024  Component Date Value Ref Range Status   SARS Coronavirus 2 Ag 03/07/2024 Negative  Negative Final   Influenza A, POC 03/07/2024 Negative  Negative Final   Influenza B, POC 03/07/2024 Negative  Negative Final  Office Visit on 02/07/2024  Component Date Value Ref Range Status   Color, UA 02/07/2024 YELLOW   Final   Clarity, UA 02/07/2024 CLEAR   Final    Glucose, UA 02/07/2024 Negative  Negative Final   Bilirubin, UA 02/07/2024 NEG   Final   Ketones, UA 02/07/2024 NEG   Final   Spec Grav, UA 02/07/2024 1.020  1.010 - 1.025 Final   Blood, UA 02/07/2024 NEG   Final   pH, UA 02/07/2024 6.0  5.0 - 8.0 Final   Protein, UA 02/07/2024 Negative  Negative Final   Urobilinogen, UA 02/07/2024 0.2  0.2 or 1.0 E.U./dL Final   Nitrite, UA 94/79/7974 NEG   Final   Leukocytes, UA 02/07/2024 Negative  Negative Final   MICRO NUMBER: 02/07/2024 [id number]   Final   SPECIMEN QUALITY: 02/07/2024 Adequate   Final   Sample Source 02/07/2024 URINE   Final   STATUS: 02/07/2024 FINAL   Final   Result: 02/07/2024 Less than 10,000 CFU/mL of single Gram positive organism isolated. No further testing will be performed. If clinically indicated, recollection using a method to minimize contamination, with prompt transfer to Urine Culture Transport Tube, is recommended.   Final  Appointment on 01/05/2024  Component Date Value Ref Range Status   Sodium 01/05/2024 140  135 - 145 mmol/L Final   Potassium 01/05/2024 3.5  3.5 - 5.1 mmol/L Final   Chloride 01/05/2024 105  98 - 111 mmol/L Final   CO2 01/05/2024 30  22 - 32 mmol/L Final   Glucose, Bld 01/05/2024 83  70 - 99 mg/dL Final   BUN 95/82/7974 15  8 - 23 mg/dL Final   Creatinine 95/82/7974 0.82  0.44 - 1.00 mg/dL Final   Calcium  01/05/2024 9.6  8.9 - 10.3 mg/dL Final   Total Protein 95/82/7974 7.2  6.5 - 8.1 g/dL Final   Albumin  01/05/2024 4.1  3.5 - 5.0 g/dL Final   AST 95/82/7974 20  15 - 41 U/L Final   ALT 01/05/2024 13  0 - 44 U/L Final   Alkaline Phosphatase 01/05/2024 85  38 - 126 U/L Final   Total Bilirubin 01/05/2024 0.5  0.0 - 1.2 mg/dL Final   GFR, Estimated 01/05/2024 >60  >60 mL/min Final   Anion gap 01/05/2024 5  5 - 15 Final   Specimen Description 01/05/2024    Final                   Value:URINE, CLEAN CATCH Performed at [facility], [address], [place], [place]  [address]    Special Requests 01/05/2024    Final  Value:NONE Performed at [facility], [address], [place], [place] [address]    Culture 01/05/2024    Final                   Value:NO GROWTH Performed at [facility], [address] [place], [place] [address]    Report Status 01/05/2024 01/06/2024 FINAL   Final   Color, Urine 01/05/2024 YELLOW  YELLOW Final   APPearance 01/05/2024 CLEAR  CLEAR Final   Specific Gravity, Urine 01/05/2024 1.021  1.005 - 1.030 Final   pH 01/05/2024 5.0  5.0 - 8.0 Final   Glucose, UA 01/05/2024 NEGATIVE  NEGATIVE mg/dL Final   Hgb urine dipstick 01/05/2024 NEGATIVE  NEGATIVE Final   Bilirubin Urine 01/05/2024 NEGATIVE  NEGATIVE Final   Ketones, ur 01/05/2024 NEGATIVE  NEGATIVE mg/dL Final   Protein, ur 95/82/7974 30 (A)  NEGATIVE mg/dL Final   Nitrite 95/82/7974 NEGATIVE  NEGATIVE Final   Leukocytes,Ua 01/05/2024 NEGATIVE  NEGATIVE Final   RBC / HPF 01/05/2024 0-5  0 - 5 RBC/hpf Final   WBC, UA 01/05/2024 0-5  0 - 5 WBC/hpf Final   Bacteria, UA 01/05/2024 NONE SEEN  NONE SEEN Final   Squamous Epithelial / HPF 01/05/2024 0-5  0 - 5 /HPF Final   Mucus 01/05/2024 PRESENT   Final   Hyaline Casts, UA 01/05/2024 PRESENT   Final   WBC 01/05/2024 3.5 (L)  4.0 - 10.5 K/uL Final   RBC 01/05/2024 3.89  3.87 - 5.11 MIL/uL Final   Hemoglobin 01/05/2024 12.3  12.0 - 15.0 g/dL Final   HCT 95/82/7974 35.8 (L)  36.0 - 46.0 % Final   MCV 01/05/2024 92.0  80.0 - 100.0 fL Final   MCH 01/05/2024 31.6  26.0 - 34.0 pg Final   MCHC 01/05/2024 34.4  30.0 - 36.0 g/dL Final   RDW 95/82/7974 12.6  11.5 - 15.5 % Final   Platelets 01/05/2024 155  150 - 400 K/uL Final   nRBC 01/05/2024 0.0  0.0 - 0.2 % Final   Neutrophils Relative % 01/05/2024 23  % Final   Neutro Abs 01/05/2024 0.8 (L)  1.7 - 7.7 K/uL Final   Lymphocytes Relative 01/05/2024 54  % Final   Lymphs Abs 01/05/2024 1.9  0.7 - 4.0 K/uL Final   Monocytes Relative  01/05/2024 19  % Final   Monocytes Absolute 01/05/2024 0.7  0.1 - 1.0 K/uL Final   Eosinophils Relative 01/05/2024 3  % Final   Eosinophils Absolute 01/05/2024 0.1  0.0 - 0.5 K/uL Final   Basophils Relative 01/05/2024 1  % Final   Basophils Absolute 01/05/2024 0.0  0.0 - 0.1 K/uL Final   Immature Granulocytes 01/05/2024 0  % Final   Abs Immature Granulocytes 01/05/2024 0.01  0.00 - 0.07 K/uL Final  Office Visit on 10/31/2023  Component Date Value Ref Range Status   Color, Urine 10/31/2023 YELLOW  YELLOW Final   APPearance 10/31/2023 CLEAR  CLEAR Final   Specific Gravity, Urine 10/31/2023 1.026  1.001 - 1.035 Final   pH 10/31/2023 6.0  5.0 - 8.0 Final   Glucose, UA 10/31/2023 NEGATIVE  NEGATIVE Final   Bilirubin Urine 10/31/2023 NEGATIVE  NEGATIVE Final   Ketones, ur 10/31/2023 NEGATIVE  NEGATIVE Final   Hgb urine dipstick 10/31/2023 NEGATIVE  NEGATIVE Final   Protein, ur 10/31/2023 TRACE (A)  NEGATIVE Final   Nitrites, Initial 10/31/2023 NEGATIVE  NEGATIVE Final   Leukocyte Esterase 10/31/2023 NEGATIVE  NEGATIVE Final  WBC, UA 10/31/2023 NONE SEEN  0 - 5 /HPF Final   RBC / HPF 10/31/2023 NONE SEEN  0 - 2 /HPF Final   Squamous Epithelial / HPF 10/31/2023 NONE SEEN  < OR = 5 /HPF Final   Bacteria, UA 10/31/2023 NONE SEEN  NONE SEEN /HPF Final   Hyaline Cast 10/31/2023 NONE SEEN  NONE SEEN /LPF Final   Note 10/31/2023    Final   Reflexve Urine Culture 10/31/2023    Final  Admission on 07/22/2023, Discharged on 07/22/2023  Component Date Value Ref Range Status   SARS Coronavirus 2 by RT PCR 07/22/2023 NEGATIVE  NEGATIVE Final   Influenza A by PCR 07/22/2023 NEGATIVE  NEGATIVE Final   Influenza B by PCR 07/22/2023 NEGATIVE  NEGATIVE Final   Resp Syncytial Virus by PCR 07/22/2023 NEGATIVE  NEGATIVE Final   Group A Strep by PCR 07/22/2023 NOT DETECTED  NOT DETECTED Final   Lactic Acid, Venous 07/22/2023 0.9  0.5 - 1.9 mmol/L Final   Sodium 07/22/2023 138  135 - 145 mmol/L Final    Potassium 07/22/2023 3.5  3.5 - 5.1 mmol/L Final   Chloride 07/22/2023 105  98 - 111 mmol/L Final   CO2 07/22/2023 26  22 - 32 mmol/L Final   Glucose, Bld 07/22/2023 93  70 - 99 mg/dL Final   BUN 88/98/7975 13  8 - 23 mg/dL Final   Creatinine, Ser 07/22/2023 0.66  0.44 - 1.00 mg/dL Final   Calcium  07/22/2023 10.0  8.9 - 10.3 mg/dL Final   Total Protein 88/98/7975 7.0  6.5 - 8.1 g/dL Final   Albumin  07/22/2023 3.9  3.5 - 5.0 g/dL Final   AST 88/98/7975 21  15 - 41 U/L Final   ALT 07/22/2023 13  0 - 44 U/L Final   Alkaline Phosphatase 07/22/2023 77  38 - 126 U/L Final   Total Bilirubin 07/22/2023 0.5  0.3 - 1.2 mg/dL Final   GFR, Estimated 07/22/2023 >60  >60 mL/min Final   Anion gap 07/22/2023 7  5 - 15 Final   WBC 07/22/2023 4.4  4.0 - 10.5 K/uL Final   RBC 07/22/2023 4.03  3.87 - 5.11 MIL/uL Final   Hemoglobin 07/22/2023 12.4  12.0 - 15.0 g/dL Final   HCT 88/98/7975 37.0  36.0 - 46.0 % Final   MCV 07/22/2023 91.8  80.0 - 100.0 fL Final   MCH 07/22/2023 30.8  26.0 - 34.0 pg Final   MCHC 07/22/2023 33.5  30.0 - 36.0 g/dL Final   RDW 88/98/7975 12.5  11.5 - 15.5 % Final   Platelets 07/22/2023 182  150 - 400 K/uL Final   nRBC 07/22/2023 0.0  0.0 - 0.2 % Final   Neutrophils Relative % 07/22/2023 56  % Final   Neutro Abs 07/22/2023 2.5  1.7 - 7.7 K/uL Final   Lymphocytes Relative 07/22/2023 26  % Final   Lymphs Abs 07/22/2023 1.2  0.7 - 4.0 K/uL Final   Monocytes Relative 07/22/2023 15  % Final   Monocytes Absolute 07/22/2023 0.7  0.1 - 1.0 K/uL Final   Eosinophils Relative 07/22/2023 3  % Final   Eosinophils Absolute 07/22/2023 0.1  0.0 - 0.5 K/uL Final   Basophils Relative 07/22/2023 0  % Final   Basophils Absolute 07/22/2023 0.0  0.0 - 0.1 K/uL Final   Immature Granulocytes 07/22/2023 0  % Final   Abs Immature Granulocytes 07/22/2023 0.01  0.00 - 0.07 K/uL Final   Prothrombin Time 07/22/2023 13.5  11.4 - 15.2  seconds Final   INR 07/22/2023 1.0  0.8 - 1.2 Final   aPTT 07/22/2023  27  24 - 36 seconds Final   Specimen Description 07/22/2023    Final                   Value:BLOOD LEFT ANTECUBITAL Performed at [facility], [address], [place], [place] [address]    Special Requests 07/22/2023    Final                   Value:BOTTLES DRAWN AEROBIC AND ANAEROBIC Blood Culture adequate volume Performed at [facility], [address] [place], [place] [address]    Culture 07/22/2023    Final                   Value:NO GROWTH 5 DAYS Performed at [facility], [address] [place], [place] [address]    Report Status 07/22/2023 07/27/2023 FINAL   Final   Specimen Description 07/22/2023    Final                   Value:BLOOD BLOOD LEFT ARM Performed at [facility], [address] [place], [place] [address]    Special Requests 07/22/2023    Final                   Value:BOTTLES DRAWN AEROBIC AND ANAEROBIC Blood Culture adequate volume Performed at [facility], [address], [place], [place] [address]    Culture 07/22/2023    Final                   Value:NO GROWTH 5 DAYS Performed at [facility], [address] [place], [place] [address]    Report Status 07/22/2023 07/27/2023 FINAL   Final   Specimen Source 07/22/2023 URINE, CLEAN CATCH   Final   Color, Urine 07/22/2023 YELLOW  YELLOW Final   APPearance 07/22/2023 CLEAR  CLEAR Final   Specific Gravity, Urine 07/22/2023 1.012  1.005 - 1.030 Final   pH 07/22/2023 7.0  5.0 - 8.0 Final   Glucose, UA 07/22/2023 NEGATIVE  NEGATIVE mg/dL Final   Hgb urine dipstick 07/22/2023 SMALL (A)  NEGATIVE Final   Bilirubin Urine 07/22/2023 NEGATIVE  NEGATIVE Final   Ketones, ur 07/22/2023 NEGATIVE  NEGATIVE mg/dL Final   Protein, ur 88/98/7975 NEGATIVE  NEGATIVE mg/dL Final   Nitrite 88/98/7975 NEGATIVE  NEGATIVE Final   Leukocytes,Ua 07/22/2023 NEGATIVE  NEGATIVE Final   RBC / HPF 07/22/2023 6-10  0 - 5 RBC/hpf Final   WBC, UA  07/22/2023 0-5  0 - 5 WBC/hpf Final   Bacteria, UA 07/22/2023 NONE SEEN  NONE SEEN Final   Squamous Epithelial / HPF 07/22/2023 0-5  0 - 5 /HPF Final   Mucus 07/22/2023 PRESENT   Final  Appointment on 06/30/2023  Component Date Value Ref Range Status   Sodium 06/30/2023 140  135 - 145 mmol/L Final   Potassium 06/30/2023 3.7  3.5 - 5.1 mmol/L Final   Chloride 06/30/2023 107  98 - 111 mmol/L Final   CO2 06/30/2023 28  22 - 32 mmol/L Final   Glucose, Bld 06/30/2023 83  70 - 99 mg/dL Final   BUN 89/89/7975 10  8 - 23 mg/dL Final   Creatinine 89/89/7975 0.75  0.44 - 1.00 mg/dL Final   Calcium  06/30/2023 9.6  8.9 - 10.3 mg/dL Final   Total Protein 89/89/7975 7.0  6.5 -  8.1 g/dL Final   Albumin  06/30/2023 3.9  3.5 - 5.0 g/dL Final   AST 89/89/7975 20  15 - 41 U/L Final   ALT 06/30/2023 12  0 - 44 U/L Final   Alkaline Phosphatase 06/30/2023 80  38 - 126 U/L Final   Total Bilirubin 06/30/2023 0.5  0.3 - 1.2 mg/dL Final   GFR, Estimated 06/30/2023 >60  >60 mL/min Final   Anion gap 06/30/2023 5  5 - 15 Final   WBC 06/30/2023 3.2 (L)  4.0 - 10.5 K/uL Final   RBC 06/30/2023 3.86 (L)  3.87 - 5.11 MIL/uL Final   Hemoglobin 06/30/2023 12.3  12.0 - 15.0 g/dL Final   HCT 89/89/7975 36.4  36.0 - 46.0 % Final   MCV 06/30/2023 94.3  80.0 - 100.0 fL Final   MCH 06/30/2023 31.9  26.0 - 34.0 pg Final   MCHC 06/30/2023 33.8  30.0 - 36.0 g/dL Final   RDW 89/89/7975 12.4  11.5 - 15.5 % Final   Platelets 06/30/2023 160  150 - 400 K/uL Final   nRBC 06/30/2023 0.0  0.0 - 0.2 % Final   Neutrophils Relative % 06/30/2023 28  % Final   Neutro Abs 06/30/2023 0.9 (L)  1.7 - 7.7 K/uL Final   Lymphocytes Relative 06/30/2023 52  % Final   Lymphs Abs 06/30/2023 1.7  0.7 - 4.0 K/uL Final   Monocytes Relative 06/30/2023 16  % Final   Monocytes Absolute 06/30/2023 0.5  0.1 - 1.0 K/uL Final   Eosinophils Relative 06/30/2023 3  % Final   Eosinophils Absolute 06/30/2023 0.1  0.0 - 0.5 K/uL Final   Basophils Relative  06/30/2023 1  % Final   Basophils Absolute 06/30/2023 0.0  0.0 - 0.1 K/uL Final   Immature Granulocytes 06/30/2023 0  % Final   Abs Immature Granulocytes 06/30/2023 0.00  0.00 - 0.07 K/uL Final  Office Visit on 05/05/2023  Component Date Value Ref Range Status   TSH 05/05/2023 0.89  0.35 - 5.50 uIU/mL Final   Sodium 05/05/2023 136  135 - 145 mEq/L Final   Potassium 05/05/2023 4.0  3.5 - 5.1 mEq/L Final   Chloride 05/05/2023 102  96 - 112 mEq/L Final   CO2 05/05/2023 26  19 - 32 mEq/L Final   Glucose, Bld 05/05/2023 79  70 - 99 mg/dL Final   BUN 91/84/7975 16  6 - 23 mg/dL Final   Creatinine, Ser 05/05/2023 0.73  0.40 - 1.20 mg/dL Final   Total Bilirubin 05/05/2023 0.4  0.2 - 1.2 mg/dL Final   Alkaline Phosphatase 05/05/2023 88  39 - 117 U/L Final   AST 05/05/2023 19  0 - 37 U/L Final   ALT 05/05/2023 12  0 - 35 U/L Final   Total Protein 05/05/2023 6.9  6.0 - 8.3 g/dL Final   Albumin  05/05/2023 4.0  3.5 - 5.2 g/dL Final   GFR 91/84/7975 81.13  >60.00 mL/min Final   Calcium  05/05/2023 9.9  8.4 - 10.5 mg/dL Final   WBC 91/84/7975 4.3  4.0 - 10.5 K/uL Final   RBC 05/05/2023 3.97  3.87 - 5.11 Mil/uL Final   Hemoglobin 05/05/2023 12.4  12.0 - 15.0 g/dL Final   HCT 91/84/7975 37.7  36.0 - 46.0 % Final   MCV 05/05/2023 94.8  78.0 - 100.0 fl Final   MCHC 05/05/2023 32.9  30.0 - 36.0 g/dL Final   RDW 91/84/7975 13.7  11.5 - 15.5 % Final   Platelets 05/05/2023 185.0  150.0 - 400.0  K/uL Final   Neutrophils Relative % 05/05/2023 33.8 (L)  43.0 - 77.0 % Final   Lymphocytes Relative 05/05/2023 47.1 (H)  12.0 - 46.0 % Final   Monocytes Relative 05/05/2023 16.1 (H)  3.0 - 12.0 % Final   Eosinophils Relative 05/05/2023 2.5  0.0 - 5.0 % Final   Basophils Relative 05/05/2023 0.5  0.0 - 3.0 % Final   Neutro Abs 05/05/2023 1.5  1.4 - 7.7 K/uL Final   Lymphs Abs 05/05/2023 2.0  0.7 - 4.0 K/uL Final   Monocytes Absolute 05/05/2023 0.7  0.1 - 1.0 K/uL Final   Eosinophils Absolute 05/05/2023 0.1  0.0 -  0.7 K/uL Final   Basophils Absolute 05/05/2023 0.0  0.0 - 0.1 K/uL Final   Hgb A1c MFr Bld 05/05/2023 5.4  4.6 - 6.5 % Final   Vitamin B-12 05/05/2023 326  211 - 911 pg/mL Final   Folate 05/05/2023 16.7  >5.9 ng/mL Final   VITD 05/05/2023 33.61  30.00 - 100.00 ng/mL Final  There may be more visits with results that are not included.  No image results found. CT Knee Left Wo Contrast Result Date: 04/22/2024 EXAM: CT Left Knee Without IV Contrast 04/21/2024 11:58:28 PM TECHNIQUE: Axial images were acquired through the left knee without IV contrast. Reformatted images were reviewed. Automated exposure control, iterative reconstruction, and/or weight based adjustment of the mA/kV was utilized to reduce the radiation dose to as low as reasonably achievable. COMPARISON: Same day radiographs. CLINICAL HISTORY: Knee trauma, occult fracture suspected, xray done. FINDINGS: BONES: No acute fracture. JOINTS: No dislocation. Total knee arthroplasty. Small knee joint effusion. SOFT TISSUES: The soft tissues are unremarkable. IMPRESSION: 1. No acute osseous abnormality. 2. Small knee joint effusion. Electronically signed by: Norman Gatlin MD 04/22/2024 12:10 AM EDT RP Workstation: [id number]   DG Knee Complete 4 Views Left Result Date: 04/21/2024 EXAM: 4 or more VIEW(S) XRAY OF THE LEFT KNEE 04/21/2024 09:19:39 PM COMPARISON: 01/03/2022 CLINICAL HISTORY: Fall. Pt reports that she was walking backwards out of a bathroom while cleaning her church. She fell straight back and now reports L knee pain. H/x L knee replacement. Home brace present. FINDINGS: BONES AND JOINTS: Left total knee arthroplasty. No radiographic evidence of loosening. No acute fracture or dislocation. Unchanged small knee joint effusion. SOFT TISSUES: The soft tissues are unremarkable. IMPRESSION: 1. No acute fracture or dislocation. 2. Unchanged small knee joint effusion. Electronically signed by: Norman Gatlin MD 04/21/2024 09:25 PM EDT RP  Workstation: [id number]   DG Chest 2 View Result Date: 04/09/2024 CLINICAL DATA:  Chest pain with productive cough. EXAM: CHEST - 2 VIEW COMPARISON:  07/22/2023 and 06/04/2022 radiographs. Cardiac CT 06/02/2023. FINDINGS: The heart size and mediastinal contours are normal. The lungs are clear. There is no pleural effusion or pneumothorax. No acute osseous findings are identified. Mild degenerative changes in the spine. Telemetry leads overlie the chest. IMPRESSION: No evidence of acute cardiopulmonary process. Electronically Signed   By: Elsie Perone M.D.   On: 04/09/2024 12:26   CT Head Wo Contrast Result Date: 04/09/2024 CLINICAL DATA:  73 year old female with new onset headache. Cough. Dizziness. EXAM: CT HEAD WITHOUT CONTRAST TECHNIQUE: Contiguous axial images were obtained from the base of the skull through the vertex without intravenous contrast. RADIATION DOSE REDUCTION: This exam was performed according to the departmental dose-optimization program which includes automated exposure control, adjustment of the mA and/or kV according to patient size and/or use of iterative reconstruction technique. COMPARISON:  Head CT 07/06/2016. FINDINGS: Brain: Cerebral  volume remains normal for age. No midline shift, ventriculomegaly, mass effect, evidence of mass lesion, intracranial hemorrhage or evidence of cortically based acute infarction. Gray-white differentiation stable and within normal limits for age. Vascular: No suspicious intracranial vascular hyperdensity. Skull: Stable and intact.  No acute osseous abnormality identified. Sinuses/Orbits: Visualized paranasal sinuses and mastoids are stable and well aerated. Other: Postoperative changes to both globes since 2017. No acute orbit or scalp soft tissue finding. IMPRESSION: Normal for age noncontrast Head CT. Electronically Signed   By: VEAR Hurst M.D.   On: 04/09/2024 12:24         ASSESSMENT & PLAN   Assessment & Plan COVID Symptom(s) resolved and  test neg Other fatigue Fatigue and drowsiness   She reports two months of fatigue and mild drowsiness, likely due to insufficient sleep and anxiety. A lupus flare is unlikely, but a nutritional deficiency from a lack of green vegetables is possible. Order CBC, CMP, ESR, CRP, thyroid  panel, urinalysis, iron studies, vitamin D , complement levels, B12, folate, and non-fasting A1c. Recommend Centrum Silver multivitamin with minerals. Share lab results with rheumatologist Jon Learn. Neutropenia, unspecified type (HCC) Other systemic lupus erythematosus with other organ involvement (HCC) She experienced a recent lupus flare with fever, facial rash, and joint pain, managed with Celebrex . Continue hydroxychloroquine with regular eye exams. No steroids are recommended by the rheumatologist. Continue Celebrex  for joint pain and refill hydrocodone  for severe pain episodes. Frequent urination Chronic kidney disease, stage 2 (mild) Other microscopic hematuria Proteinuria, unspecified type Will order lab testing to guide management. Share with rheumatology Rheumatoid arthritis involving multiple sites, unspecified whether rheumatoid factor present [facility]) Will order lab testing to guide management. Share with rheumatology Other specified anxiety disorders Her anxiety may be related to caregiving stress. She is currently on buspirone  15 mg BID and prefers a single medication. SSRIs and SNRIs were discussed, but Prozac is unsuitable due to adverse effects. Consider Lexapro or increasing the buspirone  dose. Increase buspirone  by 5 mg per day every 2-3 days as needed, not exceeding the maximum dose. Consider Lexapro if buspirone  monotherapy fails. Follow up in one month to assess anxiety control. Hypertension, unspecified type Essential hypertension Palpitations Her blood pressure is 136/76, well-controlled with metoprolol  and losartan -hydrochlorothiazide . Refill both medications.  ORDER ASSOCIATIONS  #    DIAGNOSIS / CONDITION ICD-10 ENCOUNTER ORDER     ICD-10-CM   1. Other fatigue  R53.83 CBC with Differential/Platelet    Comp Met (CMET)    Sedimentation rate    C-reactive protein    B12 and Folate Panel    C3 complement    C4 complement    Vitamin D  (25 hydroxy)    Iron, TIBC and Ferritin Panel    Urinalysis w microscopic + reflex cultur    Protein / creatinine ratio, urine    TSH Rfx on Abnormal to Free T4    2. COVID  U07.1 POC COVID-19 [employer]axNow    3. Neutropenia, unspecified type (HCC)  D70.9 C-reactive protein    B12 and Folate Panel    Iron, TIBC and Ferritin Panel    4. Proteinuria, unspecified type  R80.9 Urinalysis w microscopic + reflex cultur    5. Frequent urination  R35.0 Urinalysis w microscopic + reflex cultur    Protein / creatinine ratio, urine    6. Chronic kidney disease, stage 2 (mild)  N18.2     7. Other systemic lupus erythematosus with other organ involvement (HCC)  M32.19 C3 complement    C4 complement  Protein / creatinine ratio, urine    8. Rheumatoid arthritis involving multiple sites, unspecified whether rheumatoid factor present (HCC)  M06.9 Sedimentation rate    C-reactive protein    Protein / creatinine ratio, urine    9. Other microscopic hematuria  R31.29 Iron, TIBC and Ferritin Panel    10. Other specified anxiety disorders  F41.8 TSH Rfx on Abnormal to Free T4    DISCONTINUED: busPIRone  (BUSPAR ) 15 MG tablet    11. Essential hypertension  I10 metoprolol  succinate (TOPROL -XL) 50 MG 24 hr tablet    12. Palpitations  R00.2 metoprolol  succinate (TOPROL -XL) 50 MG 24 hr tablet    13. Hypertension, unspecified type  I10 losartan -hydrochlorothiazide  (HYZAAR) 50-12.5 MG tablet      Meds ordered this encounter  Medications   metoprolol  succinate (TOPROL -XL) 50 MG 24 hr tablet    Sig: Take 1 tablet (50 mg total) by mouth daily.    Dispense:  90 tablet    Refill:  1   losartan -hydrochlorothiazide  (HYZAAR) 50-12.5 MG tablet    Sig: Take  1 tablet by mouth daily.    Dispense:  90 tablet    Refill:  3   DISCONTD: busPIRone  (BUSPAR ) 15 MG tablet    Sig: Take 1 tablet (15 mg total) by mouth 2 (two) times daily.    Dispense:  90 tablet    Refill:  3   busPIRone  (BUSPAR ) 10 MG tablet    Sig: Take 2 tablets (20 mg total) by mouth 2 (two) times daily. For first week, just 15 mg am, 20 mg pm, then 20 mg twice daily after that    Dispense:  360 tablet    Refill:  0          This document was synthesized by artificial intelligence ([employer]) using HIPAA-compliant recording of the clinical interaction;   We discussed the use of AI scribe software for clinical note transcription with the patient, who gave verbal consent to proceed. additional Info: This encounter employed state-of-the-art, real-time, collaborative documentation. The patient actively reviewed and assisted in updating their electronic medical record on a shared screen, ensuring transparency and facilitating joint problem-solving for the problem list, overview, and plan. This approach promotes accurate, informed care. The treatment plan was discussed and reviewed in detail, including medication safety, potential side effects, and all patient questions. We confirmed understanding and comfort with the plan. Follow-up instructions were established, including contacting the office for any concerns, returning if symptoms worsen, persist, or new symptoms develop, and precautions for potential emergency department visits.

## 2024-05-01 NOTE — Assessment & Plan Note (Signed)
 Her anxiety may be related to caregiving stress. She is currently on buspirone  15 mg BID and prefers a single medication. SSRIs and SNRIs were discussed, but Prozac is unsuitable due to adverse effects. Consider Lexapro or increasing the buspirone  dose. Increase buspirone  by 5 mg per day every 2-3 days as needed, not exceeding the maximum dose. Consider Lexapro if buspirone  monotherapy fails. Follow up in one month to assess anxiety control.

## 2024-05-01 NOTE — Patient Instructions (Signed)
 It was a pleasure seeing you today! Your health and satisfaction are our top priorities.  Bernardino Cone, MD  VISIT SUMMARY: Today, we discussed your recent fatigue and mild drowsiness, which you believe are due to insufficient sleep and anxiety. We also reviewed your recent fall and knee injury, your lupus flare-up, and your ongoing management of rheumatoid arthritis and hypertension. We talked about your anxiety and its possible connection to caregiving stress, and we reviewed your medications and overall health maintenance.  YOUR PLAN: -FATIGUE AND DROWSINESS: Your fatigue and mild drowsiness are likely due to not getting enough sleep and anxiety. We will check for any nutritional deficiencies and other possible causes with a series of blood tests. I recommend you start taking Centrum Silver multivitamin with minerals. We will share your lab results with your rheumatologist, Dr. Jon Learn.  -ANXIETY DISORDER: Your anxiety may be related to the stress of caregiving. You are currently taking buspirone , and we discussed possibly increasing the dose or switching to another medication if needed. You can increase your buspirone  dose by 5 mg per day every 2-3 days as needed, but do not exceed the maximum dose. If this does not help, we may consider adding Lexapro. We will follow up in one month to see how you are doing.  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 Continue taking Celebrex  for joint pain and hydroxychloroquine with regular eye exams. We will refill your hydrocodone  for severe pain episodes as needed.  -HYPERTENSION: Your blood pressure is well-controlled with your current medications. We will refill your prescriptions for metoprolol  and losartan -hydrochlorothiazide .  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 We will continue to monitor your condition.  INSTRUCTIONS: We have ordered several blood tests to check for any nutritional deficiencies and other possible causes of your fatigue. Please follow up in one month to assess your anxiety control and discuss the results of your blood tests.  Your Providers PCP: Cone Bernardino MATSU, MD,  [phone]) Referring Provider: Cone Bernardino MATSU, MD,  [phone]) Care Team Provider: Ishmael Jon, MD,  [phone]) Care Team Provider: Kerman Vina HERO, NP,  [phone]) Care Team Provider: Murrell Drivers, MD,  [phone]) Care Team Provider: Raford Riggs, MD,  [phone]) Care Team Provider: Loretha Ash, MD,  [phone]) Care Team Provider: Aneita Gwendlyn DASEN, MD  NEXT STEPS: [x]  Early Intervention: Schedule sooner appointment, call our on-call services, or go to emergency room if there is any significant Increase in pain or discomfort New or worsening symptoms Sudden or severe changes in your health [x]  Flexible Follow-Up: We recommend a No follow-ups on file. for optimal routine care. This allows for progress monitoring and treatment adjustments. [x]  Preventive Care: Schedule your annual preventive care visit! It's typically covered by insurance and helps identify potential health issues early. [x]  Lab & X-ray Appointments: Incomplete tests scheduled today, or call to schedule. X-rays: [facility] (M-F, 8:30am-noon or 1pm-5pm). [x]  Medical Information Release: Sign a release form at front desk to obtain relevant medical information we don't have.  MAKING THE MOST OF OUR FOCUSED 20 MINUTE APPOINTMENTS: [x]   Clearly state your top concerns at the beginning of the visit to focus our discussion [x]   If you anticipate you will need more time, please inform the front desk during scheduling -  we can book multiple appointments in the same week. [x]   If you  have transportation problems- use our convenient video appointments or ask about transportation support. [x]   We can get down to business faster if you use MyChart to update information before the visit and submit non-urgent questions before your visit. Thank you for taking the time to provide details through MyChart.  Let our nurse know and she can import this information into your encounter documents.  Arrival and Wait Times: [x]   Arriving on time ensures that everyone receives prompt attention. [x]   Early morning (8a) and afternoon (1p) appointments tend to have shortest wait times. [x]   Unfortunately, we cannot delay appointments for late arrivals or hold slots during phone calls.  Getting Answers and Following Up [x]   Simple Questions & Concerns: For quick questions or basic follow-up after your visit, reach us  at [phone] or MyChart messaging. [x]   Complex Concerns: If your concern is more complex, scheduling an appointment might be best. Discuss this with the staff to find the most suitable option. [x]   Lab & Imaging Results: We'll contact you directly if results are abnormal or you don't use MyChart. Most normal results will be on MyChart within 2-3 business days, with a review message from Dr. Jesus. Haven't heard back in 2 weeks? Need results sooner? Contact us  at [phone]. [x]   Referrals: Our referral coordinator will manage specialist referrals. The specialist's office should contact you within 2 weeks to schedule an appointment. Call us  if you haven't heard from them after 2 weeks.  Staying Connected [x]   MyChart: Activate your MyChart for the fastest way to access results and message us . See the last page of this paperwork for instructions on how to activate.  Bring to Your Next Appointment [x]   Medications: Please bring all your medication bottles to your next appointment to ensure we have an accurate record of your prescriptions. [x]   Health Diaries: If you're  monitoring any health conditions at home, keeping a diary of your readings can be very helpful for discussions at your next appointment.  Billing [x]   X-ray & Lab Orders: These are billed by separate companies. Contact the invoicing company directly for questions or concerns. [x]   Visit Charges: Discuss any billing inquiries with our administrative services team.  Your Satisfaction Matters [x]   Share Your Experience: We strive for your satisfaction! If you have any complaints, or preferably compliments, please let Dr. Jesus know directly or contact our Practice Administrators, Manuelita Rubin or [employer], by asking at the front desk.   Reviewing Your Records [x]   Review this early draft of your clinical encounter notes below and the final encounter summary tomorrow on MyChart after its been completed.  All orders placed so far are visible here: Other fatigue -     CBC with Differential/Platelet -     Comprehensive metabolic panel with GFR -     Sedimentation rate -     C-reactive protein -     B12 and Folate Panel -     C3 complement -     C4 complement -     VITAMIN D  25 Hydroxy (Vit-D Deficiency, Fractures) -     Iron, TIBC and Ferritin Panel -     Urinalysis w microscopic + reflex cultur -     Protein / creatinine ratio, urine -     TSH Rfx on Abnormal to Free T4  COVID -     POC COVID-19 [employer]Now  Neutropenia, unspecified  type (HCC) -     C-reactive protein -     B12 and Folate Panel -     Iron, TIBC and Ferritin Panel  Proteinuria, unspecified type -     Urinalysis w microscopic + reflex cultur  Frequent urination -     Urinalysis w microscopic + reflex cultur -     Protein / creatinine ratio, urine  Chronic kidney disease, stage 2 (mild)  Other systemic lupus erythematosus with other organ involvement (HCC) -     C3 complement -     C4 complement -     Protein / creatinine ratio, urine  Rheumatoid arthritis involving multiple sites, unspecified whether  rheumatoid factor present (HCC) -     Sedimentation rate -     C-reactive protein -     Protein / creatinine ratio, urine  Other microscopic hematuria -     Iron, TIBC and Ferritin Panel  Other specified anxiety disorders -     TSH Rfx on Abnormal to Free T4  Essential hypertension -     Metoprolol  Succinate ER; Take 1 tablet (50 mg total) by mouth daily.  Dispense: 90 tablet; Refill: 1  Palpitations -     Metoprolol  Succinate ER; Take 1 tablet (50 mg total) by mouth daily.  Dispense: 90 tablet; Refill: 1  Hypertension, unspecified type -     Losartan  Potassium-HCTZ; Take 1 tablet by mouth daily.  Dispense: 90 tablet; Refill: 3  Other orders -     busPIRone  HCl; Take 2 tablets (20 mg total) by mouth 2 (two) times daily. For first week, just 15 mg am, 20 mg pm, then 20 mg twice daily after that  Dispense: 360 tablet; Refill: 0

## 2024-05-02 ENCOUNTER — Ambulatory Visit: Payer: Self-pay | Admitting: Internal Medicine

## 2024-05-02 DIAGNOSIS — N390 Urinary tract infection, site not specified: Secondary | ICD-10-CM

## 2024-05-02 LAB — IRON,TIBC AND FERRITIN PANEL
%SAT: 25 % (ref 16–45)
Ferritin: 29 ng/mL (ref 16–288)
Iron: 92 ug/dL (ref 45–160)
TIBC: 373 ug/dL (ref 250–450)

## 2024-05-02 LAB — TSH RFX ON ABNORMAL TO FREE T4: TSH: 0.705 u[IU]/mL (ref 0.450–4.500)

## 2024-05-02 NOTE — Progress Notes (Signed)
 The urine abnormal most likely diagnosis lupus inflammation, might explain recent fatigue due to ketones and protein suggest mucle breakdown and kidney strain.  Doesn't really explain drowsiness.  We should repeat urinalysis in 1-2 weeks, and stay hydrated and get plenty of nutrition in meantime

## 2024-05-02 NOTE — Telephone Encounter (Signed)
 Sent over to Dr Ishmael

## 2024-05-03 ENCOUNTER — Other Ambulatory Visit: Payer: Self-pay | Admitting: Internal Medicine

## 2024-05-03 LAB — URINALYSIS W MICROSCOPIC + REFLEX CULTURE
Bacteria, UA: NONE SEEN /HPF
Bilirubin Urine: NEGATIVE
Glucose, UA: NEGATIVE
Hgb urine dipstick: NEGATIVE
Hyaline Cast: NONE SEEN /LPF
Nitrites, Initial: NEGATIVE
Specific Gravity, Urine: 1.022 (ref 1.001–1.035)
WBC, UA: NONE SEEN /HPF (ref 0–5)
pH: 6.5 (ref 5.0–8.0)

## 2024-05-03 LAB — URINE CULTURE
MICRO NUMBER:: 16824364
SPECIMEN QUALITY:: ADEQUATE

## 2024-05-03 LAB — PROTEIN / CREATININE RATIO, URINE
Creatinine, Urine: 200 mg/dL (ref 20–275)
Protein/Creat Ratio: 105 mg/g{creat} (ref 24–184)
Protein/Creatinine Ratio: 0.105 mg/mg{creat} (ref 0.024–0.184)
Total Protein, Urine: 21 mg/dL (ref 5–24)

## 2024-05-03 LAB — EXTRA URINE SPECIMEN

## 2024-05-03 LAB — CULTURE INDICATED

## 2024-05-03 MED ORDER — CIPROFLOXACIN HCL 500 MG PO TABS: 500.0000 mg | ORAL_TABLET | Freq: Two times a day (BID) | ORAL | 0 refills | Status: AC

## 2024-05-03 NOTE — Progress Notes (Signed)
?  urinary tract infection (UTI)- with fatigue as main symptom(s).

## 2024-05-04 LAB — C3 COMPLEMENT: C3 Complement: 181 mg/dL (ref 83–193)

## 2024-05-04 LAB — C4 COMPLEMENT: C4 Complement: 29 mg/dL (ref 15–57)

## 2024-05-04 LAB — EXTRA SPECIMEN

## 2024-05-08 ENCOUNTER — Encounter: Payer: Self-pay | Admitting: Internal Medicine

## 2024-05-08 MED ORDER — HYDROCODONE-ACETAMINOPHEN 5-325 MG PO TABS: 1.0000 | ORAL_TABLET | Freq: Four times a day (QID) | ORAL | 0 refills | Status: DC | PRN

## 2024-05-08 NOTE — Addendum Note (Signed)
 Addended by: Carlyle Mcelrath G on: 05/08/2024 03:33 PM   Modules accepted: Orders

## 2024-05-28 ENCOUNTER — Emergency Department (HOSPITAL_COMMUNITY)
Admission: EM | Admit: 2024-05-28 | Discharge: 2024-05-28 | Disposition: A | Discharge status: Against Medical Advice | Attending: Emergency Medicine | Admitting: Emergency Medicine

## 2024-05-28 ENCOUNTER — Emergency Department (HOSPITAL_COMMUNITY)

## 2024-05-28 DIAGNOSIS — X501XXA Overexertion from prolonged static or awkward postures, initial encounter: Secondary | ICD-10-CM | POA: Insufficient documentation

## 2024-05-28 DIAGNOSIS — M25562 Pain in left knee: Secondary | ICD-10-CM | POA: Diagnosis not present

## 2024-05-28 DIAGNOSIS — Z743 Need for continuous supervision: Secondary | ICD-10-CM | POA: Diagnosis not present

## 2024-05-28 DIAGNOSIS — Z5321 Procedure and treatment not carried out due to patient leaving prior to being seen by health care provider: Secondary | ICD-10-CM | POA: Insufficient documentation

## 2024-05-28 NOTE — ED Triage Notes (Signed)
 Patient BIB EMS for left knee pain. Patient twisted left knee about one month ago and was seen by MD and had fluid drawn off knee. Twisted again today and complains of left knee pain.   EMS: 200mg  Fentanyl  given en route by EMS 20g Left AC

## 2024-05-29 ENCOUNTER — Other Ambulatory Visit (HOSPITAL_COMMUNITY): Payer: Self-pay | Admitting: Orthopedic Surgery

## 2024-05-29 DIAGNOSIS — M1712 Unilateral primary osteoarthritis, left knee: Secondary | ICD-10-CM | POA: Diagnosis not present

## 2024-05-29 DIAGNOSIS — M25562 Pain in left knee: Secondary | ICD-10-CM

## 2024-05-29 DIAGNOSIS — Z96652 Presence of left artificial knee joint: Secondary | ICD-10-CM | POA: Diagnosis not present

## 2024-05-31 ENCOUNTER — Other Ambulatory Visit: Payer: Self-pay | Admitting: Internal Medicine

## 2024-05-31 DIAGNOSIS — M25562 Pain in left knee: Secondary | ICD-10-CM | POA: Diagnosis not present

## 2024-05-31 DIAGNOSIS — Z96652 Presence of left artificial knee joint: Secondary | ICD-10-CM | POA: Diagnosis not present

## 2024-05-31 DIAGNOSIS — M19049 Primary osteoarthritis, unspecified hand: Secondary | ICD-10-CM

## 2024-06-04 ENCOUNTER — Encounter (HOSPITAL_BASED_OUTPATIENT_CLINIC_OR_DEPARTMENT_OTHER): Payer: Self-pay

## 2024-06-04 ENCOUNTER — Encounter (HOSPITAL_BASED_OUTPATIENT_CLINIC_OR_DEPARTMENT_OTHER): Payer: Self-pay | Admitting: Cardiovascular Disease

## 2024-06-04 MED ORDER — LOSARTAN POTASSIUM 50 MG PO TABS: 50.0000 mg | ORAL_TABLET | Freq: Every day | ORAL | 0 refills | Status: DC

## 2024-06-04 NOTE — Telephone Encounter (Signed)
 Increase losartan -hydrochlorothiazide  to 100-12.5mg  daily. BMP 1-2 weeks after medication change. Recommend schedule overdue cardiology follow up with Dr. Raford or APP.  Alicen Donalson S Sohil Timko, NP

## 2024-06-05 ENCOUNTER — Ambulatory Visit (HOSPITAL_BASED_OUTPATIENT_CLINIC_OR_DEPARTMENT_OTHER): Admitting: Family

## 2024-06-06 ENCOUNTER — Encounter: Payer: Self-pay | Admitting: Orthopedic Surgery

## 2024-06-07 ENCOUNTER — Encounter (HOSPITAL_COMMUNITY)
Admission: RE | Admit: 2024-06-07 | Discharge: 2024-06-07 | Disposition: A | Discharge status: Home / Self Care | Source: Ambulatory Visit | Attending: Orthopedic Surgery | Admitting: Orthopedic Surgery

## 2024-06-07 ENCOUNTER — Encounter (HOSPITAL_COMMUNITY): Payer: Self-pay

## 2024-06-07 DIAGNOSIS — M25562 Pain in left knee: Secondary | ICD-10-CM | POA: Diagnosis not present

## 2024-06-07 MED ORDER — TECHNETIUM TC 99M MEDRONATE IV KIT
18.5000 | PACK | Freq: Once | INTRAVENOUS | Status: AC
  Administered 2024-06-07: 18.5 via INTRAVENOUS

## 2024-06-08 ENCOUNTER — Ambulatory Visit (HOSPITAL_BASED_OUTPATIENT_CLINIC_OR_DEPARTMENT_OTHER): Admitting: Family

## 2024-06-08 ENCOUNTER — Encounter (HOSPITAL_BASED_OUTPATIENT_CLINIC_OR_DEPARTMENT_OTHER): Payer: Self-pay | Admitting: Family

## 2024-06-08 VITALS — BP 132/78 | HR 64 | Ht 67.0 in | Wt 189.0 lb

## 2024-06-08 DIAGNOSIS — I700 Atherosclerosis of aorta: Secondary | ICD-10-CM | POA: Diagnosis not present

## 2024-06-08 DIAGNOSIS — I493 Ventricular premature depolarization: Secondary | ICD-10-CM

## 2024-06-08 DIAGNOSIS — R0602 Shortness of breath: Secondary | ICD-10-CM | POA: Diagnosis not present

## 2024-06-08 DIAGNOSIS — I491 Atrial premature depolarization: Secondary | ICD-10-CM | POA: Diagnosis not present

## 2024-06-08 DIAGNOSIS — I10 Essential (primary) hypertension: Secondary | ICD-10-CM | POA: Diagnosis not present

## 2024-06-08 MED ORDER — CELECOXIB 50 MG PO CAPS: 50.0000 mg | ORAL_CAPSULE | Freq: Two times a day (BID) | ORAL | 5 refills | Status: AC

## 2024-06-08 MED ORDER — LOSARTAN POTASSIUM-HCTZ 100-12.5 MG PO TABS: 1.0000 | ORAL_TABLET | Freq: Every day | ORAL | 3 refills | Status: AC

## 2024-06-08 NOTE — Patient Instructions (Addendum)
 Medication Instructions:  Your physician has recommended you make the following change in your medication:  1.) stop losartan  and losartan -hydrochlorothiazide  2.) start losartan -hydrochlorothiazide  100-12.5 mg - take one tablet daily  *If you need a refill on your cardiac medications before your next appointment, please call your pharmacy*  Lab Work: none  Testing/Procedures: Your physician has requested that you have an echocardiogram. Echocardiography is a painless test that uses sound waves to create images of your heart. It provides your doctor with information about the size and shape of your heart and how well your heart's chambers and valves are working. This procedure takes approximately one hour. There are no restrictions for this procedure. Please do NOT wear cologne, perfume, aftershave, or lotions (deodorant is allowed). Please arrive 15 minutes prior to your appointment time.  Please note: We ask at that you not bring children with you during ultrasound (echo/ vascular) testing. Due to room size and safety concerns, children are not allowed in the ultrasound rooms during exams. Our front office staff cannot provide observation of children in our lobby area while testing is being conducted. An adult accompanying a patient to their appointment will only be allowed in the ultrasound room at the discretion of the ultrasound technician under special circumstances. We apologize for any inconvenience.   Follow-Up: At [facility], you and your health needs are our priority.  As part of our continuing mission to provide you with exceptional heart care, our providers are all part of one team.  This team includes your primary Cardiologist (physician) and Advanced Practice Providers or APPs (Physician Assistants and Nurse Practitioners) who all work together to provide you with the care you need, when you need it.  Your next appointment:   3 month(s)  Provider:   Annabella Scarce,  MD, Rosaline Bane, NP, or Reche Finder, NP    We recommend signing up for the patient portal called MyChart.  Sign up information is provided on this After Visit Summary.  MyChart is used to connect with patients for Virtual Visits (Telemedicine).  Patients are able to view lab/test results, encounter notes, upcoming appointments, etc.  Non-urgent messages can be sent to your provider as well.   To learn more about what you can do with MyChart, go to [email or web address].    Tips to Measure your Blood Pressure Correctly  Here's what you can do to ensure a correct reading:  Don't drink a caffeinated beverage or smoke during the 30 minutes before the test.  Sit quietly for five minutes before the test begins.  During the measurement, sit in a chair with your feet on the floor and your arm supported so your elbow is at about heart level.  The inflatable part of the cuff should completely cover at least 80% of your upper arm, and the cuff should be placed on bare skin, not over a shirt.  Don't talk during the measurement.  Have your blood pressure measured twice, with a brief break in between. If the readings are different by 5 points or more, have it done a third time.  Blood pressure categories  Blood pressure category SYSTOLIC (upper number)  DIASTOLIC (lower number)  Normal Less than 120 mm Hg and Less than 80 mm Hg  Elevated 120-129 mm Hg and Less than 80 mm Hg  High blood pressure: Stage 1 hypertension 130-139 mm Hg or 80-89 mm Hg  High blood pressure: Stage 2 hypertension 140 mm Hg or higher or 90 mm Hg or higher  Hypertensive crisis (consult your doctor immediately) Higher than 180 mm Hg and/or Higher than 120 mm Hg  Source: American Heart Association and American Stroke Association. For more on getting your blood pressure under control, buy Controlling Your Blood Pressure, a Special Health Report from [facility].   Blood Pressure Log   Date   Time  Blood  Pressure  Position  Example: Nov 1 9 AM 124/78 sitting

## 2024-06-08 NOTE — Addendum Note (Signed)
 Addended by: Kylee Umana G on: 06/08/2024 12:39 PM   Modules accepted: Orders

## 2024-06-08 NOTE — Progress Notes (Signed)
 Cardiology Office Note:  .   Date:  06/08/2024  ID:  BELA BONAPARTE, DOB [birth year], MRN [id number] PCP: Jesus Bernardino MATSU, MD  [facility] Providers Cardiologist:  Annabella Scarce, MD    History of Present Illness: .   LANAY ZINDA is a 73 y.o. female with a hx of SLE, HTN, prior breast cancer, PVC, PAC, aortic atherosclerosis.   Initial evaluation 06/2018 for shortness of breath and  palpitations. Monitor 06/2018 occasional PVC and PAC. Repeat monitor 01/2021 with PACs and PVCs. Patient triggered episodes associated with NSR.   At visit 01/11/22 she had a goal to be off some BP medications but as BP remained above goal HCTZ, Losartan , Metoprolol  were continued. At visit 04/12/22 home BP cuff found to be accurate with home readings 110s/70s and present regimen continued. At last 07/2022 Metoprolol  adjusted to 50mg  daily.   Seen 05/10/23. Due to family history and  generalized activity intolerance a calcium  score was ordered and performed with calcium  score of 0.   She was last seen 08/12/23 with BP controlled and no changes made.   She sent MyChart message 06/04/24 noting elevated blood pressure readings as high as 195/110 with additional readings 147/90, 137/72, 176/101, 174/81.  She also reported feeling dizzy, lightheaded. Due to elevated BP, Losartan  50mg  added to her Losartan -hydrochlorothiazide  50-12.5mg .   She presents today for follow up. She has not yet taken her medications today. Notes she generally takes meds between 10am-12pm, with a meal. She reports elevated BPs x2 weeks and associated tachycardia with this, HRs >100 during episodes. She denies chest pain, pressure, heaviness, tightness. Feels out of breath at rest, which is new since BP elevated. No trouble breathing when lying flat. She feels lighteaded at times, as if air is in [her]  head but not dizziness. She did report a headache when BP was 195/110. No vision changes. Since starting on extra losartan  50mg , her  home BPs have been improving per report. Yesterday before her meds: 167/94 and in the evening: 147/80. She does not have a home BP log to review today. She has been dealing with acute knee pain since at least 04/21/24 which is limiting mobility.   ROS: Please see the history of present illness.    All other systems reviewed and are negative.   Studies Reviewed: .       Cardiac Studies & Procedures   ______________________________________________________________________________________________     ECHOCARDIOGRAM  ECHOCARDIOGRAM COMPLETE 07/20/2018  Narrative *[facility]* [address] [place], [place] [address] [phone]  ------------------------------------------------------------------- Transthoracic Echocardiography  Patient:    Bethaney, Oshana MR #:       [id number] Study Date: 07/20/2018 Gender:     F Age:        49 Height:     167.6 cm Weight:     97.1 kg BSA:        2.16 m^2 Pt. Status: Room:  ATTENDING    Wilbert Bihari, MD SONOGRAPHER  Jon Hacker PERFORMING   [facility], Outpatient ORDERING     Annabella Scarce, MD REFERRING    Annabella Scarce, MD  cc:  ------------------------------------------------------------------- LV EF: 55% -   60%  ------------------------------------------------------------------- Indications:      R00.2 Palpitations. R06.02 Shortness of breath.  ------------------------------------------------------------------- History:   PMH:  Edema. Leg pain. Murmur. Lupus. Breast cancer. Risk factors:  Hypertension.  ------------------------------------------------------------------- Study Conclusions  - Left ventricle: The cavity size was normal. There was severe focal basal and mild concentric hypertrophy. Systolic  function was normal. The estimated ejection fraction was in the range of 55% to 60%. Wall motion was normal; there were no regional wall motion abnormalities. There was an increased relative contribution of  atrial contraction to ventricular filling. Doppler parameters are consistent with abnormal left ventricular relaxation (grade 1 diastolic dysfunction). - Aortic valve: Trileaflet; normal thickness, mildly calcified leaflets. There was mild regurgitation. - Mitral valve: There was trivial regurgitation. Valve area by pressure half-time: 2.06 cm^2. - Tricuspid valve: There was trivial regurgitation.  ------------------------------------------------------------------- Study data:  Comparison was made to the study of 10/06/2014.  Study status:  Routine.  Procedure:  The patient reported no pain pre or post test. Transthoracic echocardiography. Image quality was adequate.  Study completion:  There were no complications. Transthoracic echocardiography.  M-mode, complete 2D, spectral Doppler, and color Doppler.  Birthdate:  Patient birthdate: [birth year].  Age:  Patient is 73 yr old.  Sex:  Gender: female. BMI: 34.6 kg/m^2.  Blood pressure:     136/85  Patient status: Outpatient.  Study date:  Study date: 07/20/2018. Study time: 09:29 AM.  Location:  [facility]  -------------------------------------------------------------------  ------------------------------------------------------------------- Left ventricle:  The cavity size was normal. There was severe focal basal and mild concentric hypertrophy. Systolic function was normal. The estimated ejection fraction was in the range of 55% to 60%. Wall motion was normal; there were no regional wall motion abnormalities. There was an increased relative contribution of atrial contraction to ventricular filling. Doppler parameters are consistent with abnormal left ventricular relaxation (grade 1 diastolic dysfunction).  ------------------------------------------------------------------- Aortic valve:   Trileaflet; normal thickness, mildly calcified leaflets. Mobility was not restricted.  Doppler:  Transvalvular velocity was within the  normal range. There was no stenosis. There was mild regurgitation.  ------------------------------------------------------------------- Aorta:  Aortic root: The aortic root was normal in size.  ------------------------------------------------------------------- Mitral valve:   Structurally normal valve.   Mobility was not restricted.  Doppler:  Transvalvular velocity was within the normal range. There was no evidence for stenosis. There was trivial regurgitation.    Valve area by pressure half-time: 2.06 cm^2. Indexed valve area by pressure half-time: 0.95 cm^2/m^2.  ------------------------------------------------------------------- Left atrium:  The atrium was normal in size.  ------------------------------------------------------------------- Right ventricle:  The cavity size was normal. Wall thickness was normal. Systolic function was normal.  ------------------------------------------------------------------- Pulmonic valve:    Structurally normal valve.   Cusp separation was normal.  Doppler:  Transvalvular velocity was within the normal range. There was no evidence for stenosis. There was no regurgitation.  ------------------------------------------------------------------- Tricuspid valve:   Structurally normal valve.    Doppler: Transvalvular velocity was within the normal range. There was trivial regurgitation.  ------------------------------------------------------------------- Pulmonary artery:   The main pulmonary artery was normal-sized. Systolic pressure was within the normal range.  ------------------------------------------------------------------- Right atrium:  The atrium was normal in size.  ------------------------------------------------------------------- Pericardium:  There was no pericardial effusion.  ------------------------------------------------------------------- Systemic veins: Inferior vena cava: The vessel was normal in  size.  ------------------------------------------------------------------- Measurements  Left ventricle                           Value          Reference LV ID, ED, PLAX chordal          (L)     33    mm       43 - 52 LV ID, ES, PLAX chordal          (L)  22    mm       23 - 38 LV fx shortening, PLAX chordal           33    %        >=29 LV PW thickness, ED                      12    mm       ---------- IVS/LV PW ratio, ED                      1.25           <=1.3 LV e&', lateral                           5.87  cm/s     ---------- LV E/e&', lateral                         9.27           ---------- LV e&', medial                            4.13  cm/s     ---------- LV E/e&', medial                          13.17          ---------- LV e&', average                           5     cm/s     ---------- LV E/e&', average                         10.88          ----------  Ventricular septum                       Value          Reference IVS thickness, ED                        15    mm       ----------  LVOT                                     Value          Reference LVOT ID, S                               20    mm       ---------- LVOT area                                3.14  cm^2     ----------  Aortic valve                             Value          Reference Aortic regurg  peak velocity              365   cm/s     ---------- Aortic regurg pressure half-time         837   ms       ---------- Aortic regurg peak gradient              53    mm Hg    ----------  Aorta                                    Value          Reference Aortic root ID, ED                       33    mm       ----------  Left atrium                              Value          Reference LA ID, A-P, ES                           30    mm       ---------- LA ID/bsa, A-P                           1.39  cm/m^2   <=2.2 LA volume, S                             59.8  ml       ---------- LA volume/bsa, S                          27.6  ml/m^2   ---------- LA volume, ES, 1-p A4C                   53    ml       ---------- LA volume/bsa, ES, 1-p A4C               24.5  ml/m^2   ---------- LA volume, ES, 1-p A2C                   65.9  ml       ---------- LA volume/bsa, ES, 1-p A2C               30.4  ml/m^2   ----------  Mitral valve                             Value          Reference Mitral E-wave peak velocity              54.4  cm/s     ---------- Mitral A-wave peak velocity              92.5  cm/s     ---------- Mitral deceleration time         (H)     366   ms       150 - 230 Mitral E/A ratio, peak  0.6            ---------- Mitral valve area, PHT, DP               2.06  cm^2     ---------- Mitral valve area/bsa, PHT, DP           0.95  cm^2/m^2 ----------  Pulmonary arteries                       Value          Reference PA pressure, S, DP                       20    mm Hg    <=30  Tricuspid valve                          Value          Reference Tricuspid regurg peak velocity           209   cm/s     ---------- Tricuspid peak RV-RA gradient            17    mm Hg    ----------  Right atrium                             Value          Reference RA ID, S-I, ES, A4C              (H)     53    mm       34 - 49 RA area, ES, A4C                         11.4  cm^2     8.3 - 19.5 RA volume, ES, A/L                       20.7  ml       ---------- RA volume/bsa, ES, A/L                   9.6   ml/m^2   ----------  Systemic veins                           Value          Reference Estimated CVP                            3     mm Hg    ----------  Right ventricle                          Value          Reference RV pressure, S, DP                       20    mm Hg    <=30 RV s&', lateral, S                        11.6  cm/s     ----------  Pulmonic valve  Value          Reference Pulmonic regurg velocity, ED             102   cm/s     ----------  Legend: (L)   and  (H)  mark values outside specified reference range.  ------------------------------------------------------------------- Prepared and Electronically Authenticated by  Wilbert Bihari, MD 2019-10-31T11:14:52    MONITORS  CARDIAC EVENT MONITOR 02/17/2021  Narrative  Patient had a minimum heart rate of 52 bpm, maximum heart rate of 141 bpm, and average heart rate of 76 bpm.  Predominant underlying rhythm was sinus rhythm.  One run of non-sustained ventricular tachycardia occurred lasting 6 beats at longest with a max rate of 131 bpm at fastest.  Isolated PACs were rare (<1.0%).  Isolated PVCs were occasional (1.0%).  No evidence of complete heart block or atrial fibrillation.  Triggered and diary events associated with sinus rhythm.  No malignant arrhythmias.   CT SCANS  CT CARDIAC SCORING (SELF PAY ONLY) 06/02/2023  Addendum 06/17/2023 11:20 PM ADDENDUM REPORT: 06/17/2023 23:18  EXAM: OVER-READ INTERPRETATION  CT CHEST  The following report is an over-read performed by radiologist Dr. Oneil Devonshire of [facility] on 06/17/2023. This over-read does not include interpretation of cardiac or coronary anatomy or pathology. The coronary calcium  score interpretation by the cardiologist is attached.  COMPARISON:  None.  FINDINGS: Cardiovascular: There are no significant extracardiac vascular findings.  Mediastinum/Nodes: There are no enlarged lymph nodes within the visualized mediastinum.  Lungs/Pleura: There is no pleural effusion. The visualized lungs appear clear.  Upper abdomen: No significant findings in the visualized upper abdomen.  Musculoskeletal/Chest wall: No chest wall mass or suspicious osseous findings within the visualized chest.  IMPRESSION: No significant extracardiac findings within the visualized chest.   Electronically Signed By: Oneil Devonshire M.D. On: 06/17/2023 23:18  Narrative CLINICAL DATA:  Cardiovascular Disease Risk  stratification  EXAM: Coronary Calcium  Score  TECHNIQUE: A gated, non-contrast computed tomography scan of the heart was performed using 3mm slice thickness. Axial images were analyzed on a dedicated workstation. Calcium  scoring of the coronary arteries was performed using the Agatston method.  MEDICATIONS: MEDICATIONS None  FINDINGS: Coronary arteries: Normal origins.  Coronary Calcium  Score:  Left main: 0  Left anterior descending artery: 0  Left circumflex artery: 0  Right coronary artery: 0  Total: 0  Pericardium: Normal.  Ascending Aorta: Normal caliber.  Non-cardiac: See separate report from [facility].  IMPRESSION: Coronary calcium  score of 0 Agatston units. This suggests low risk for future cardiac events.  RECOMMENDATIONS: Coronary artery calcium  (CAC) score is a strong predictor of incident coronary heart disease (CHD) and provides predictive information beyond traditional risk factors. CAC scoring is reasonable to use in the decision to withhold, postpone, or initiate statin therapy in intermediate-risk or selected borderline-risk asymptomatic adults (age 68-75 years and LDL-C >=70 to <190 mg/dL) who do not have diabetes or established atherosclerotic cardiovascular disease (ASCVD).* In intermediate-risk (10-year ASCVD risk >=7.5% to <20%) adults or selected borderline-risk (10-year ASCVD risk >=5% to <7.5%) adults in whom a CAC score is measured for the purpose of making a treatment decision the following recommendations have been made:  If CAC=0, it is reasonable to withhold statin therapy and reassess in 5 to 10 years, as long as higher risk conditions are absent (diabetes mellitus, family history of premature CHD in first degree relatives (males <55 years; females <65 years), cigarette smoking, or LDL >=190 mg/dL).  If CAC is 1  to 101, it is reasonable to initiate statin therapy for patients >=37 years of age.  If CAC is >=100 or  >=75th percentile, it is reasonable to initiate statin therapy at any age.  Cardiology referral should be considered for patients with CAC scores >=400 or >=75th percentile.  *2018 AHA/ACC/AACVPR/AAPA/ABC/ACPM/ADA/AGS/APhA/ASPC/NLA/PCNA Guideline on the Management of Blood Cholesterol: A Report of the American College of Cardiology/American Heart Association Task Force on Clinical Practice Guidelines. J Am Coll Cardiol. 2019;73(24):3168-3209.  Ezra Shuck, MD  Electronically Signed: By: Ezra Shuck M.D. On: 06/02/2023 16:53     ______________________________________________________________________________________________          Risk Assessment/Calculations:            Physical Exam:   VS:  BP 132/78   Pulse 64   Ht 5' 7 (1.702 m)   Wt 189 lb (85.7 kg)   SpO2 97%   BMI 29.60 kg/m    Wt Readings from Last 3 Encounters:  06/08/24 189 lb (85.7 kg)  05/01/24 187 lb 9.6 oz (85.1 kg)  04/21/24 182 lb 15.7 oz (83 kg)     BP Readings from Last 3 Encounters:  06/08/24 132/78  05/28/24 (!) 138/90  05/01/24 136/76     GEN: Well nourished, well developed in no acute distress NECK: No JVD; No carotid bruits CARDIAC: RRR, no murmurs, rubs, gallops; radial and PT pulses 2+ bilaterally RESPIRATORY:  Clear to auscultation without rales, wheezing or rhonchi  ABDOMEN: Soft, non-tender, non-distended EXTREMITIES:  No edema; No deformity   ASSESSMENT AND PLAN: .    HTN, goal <130/80 - improving with addition of losartan  50mg   To reduce pill burden, combine losartan -hydrochlorothiazide  50/12.5mg  + losartan  50mg  into losartan -hydrochlorothiazide  100/12.5mg  daily - Rx sent Discussed timeliness of medications - pick a consistent time each day to take losartan -hydrochlorothiazide  and metoprolol  to increase compliance and medication duration efficacy  Continue losartan -hydrochlorothiazide  100/12.5mg  daily, metoprolol  succinate 50mg  daily Discussed to monitor BP at home at  least 2 hours after medications and sitting for 5-10 minutes.   Dyspnea - noted at rest, correlated with HTN per HPI; exam stable without crackles and wheezing; no appreciable murmurs; echo in 2019 with LVEF 55-60%, G12DD, severe focal basal and concentric LV hypertrophy, mild AI, trivial MR and TR Will update echocardiogram  Consider ischemia eval, pending echo results  Aortic atherosclerosis / Family history of cardiovascular disease- Calcium  score 05/2023 of 0.  no indication for ischemia evaluation per HPI and PE.  Consider updating lipid panel with next labs through PCP or on follow up. Last 06/2021 - LDL 80  Palpitations / PVC / PAC - isolated ectoy on 2022 monitor, with recurrent of tachycardia associated with elevated BP Advised to take metoprolol  succinate 50mg  at consistent time each day Suspect medication schedule will improve symptoms Consider zio monitor if persistent     Dispo: follow up in 3 months with Dr. Raford Spaniel NP, or Rosaline NP  Signed, Reche GORMAN Finder, NP

## 2024-06-14 DIAGNOSIS — M25562 Pain in left knee: Secondary | ICD-10-CM | POA: Diagnosis not present

## 2024-06-26 ENCOUNTER — Encounter: Payer: Self-pay | Admitting: Internal Medicine

## 2024-06-26 ENCOUNTER — Other Ambulatory Visit: Payer: Self-pay | Admitting: Internal Medicine

## 2024-06-26 ENCOUNTER — Ambulatory Visit: Admitting: Internal Medicine

## 2024-06-26 VITALS — BP 120/70 | HR 62 | Temp 98.0°F | Ht 67.0 in | Wt 188.4 lb

## 2024-06-26 DIAGNOSIS — F419 Anxiety disorder, unspecified: Secondary | ICD-10-CM

## 2024-06-26 DIAGNOSIS — E559 Vitamin D deficiency, unspecified: Secondary | ICD-10-CM

## 2024-06-26 DIAGNOSIS — R52 Pain, unspecified: Secondary | ICD-10-CM

## 2024-06-26 DIAGNOSIS — N390 Urinary tract infection, site not specified: Secondary | ICD-10-CM | POA: Diagnosis not present

## 2024-06-26 DIAGNOSIS — M1710 Unilateral primary osteoarthritis, unspecified knee: Secondary | ICD-10-CM | POA: Diagnosis not present

## 2024-06-26 DIAGNOSIS — E519 Thiamine deficiency, unspecified: Secondary | ICD-10-CM

## 2024-06-26 DIAGNOSIS — I10 Essential (primary) hypertension: Secondary | ICD-10-CM

## 2024-06-26 DIAGNOSIS — Z23 Encounter for immunization: Secondary | ICD-10-CM | POA: Diagnosis not present

## 2024-06-26 DIAGNOSIS — I4729 Other ventricular tachycardia: Secondary | ICD-10-CM

## 2024-06-26 DIAGNOSIS — Z635 Disruption of family by separation and divorce: Secondary | ICD-10-CM | POA: Diagnosis not present

## 2024-06-26 MED ORDER — JOURNAVX 50 MG PO TABS: 1.0000 | ORAL_TABLET | Freq: Two times a day (BID) | ORAL | 5 refills | Status: DC | PRN

## 2024-06-26 NOTE — Assessment & Plan Note (Signed)
 Shared decision-making done; patient understood rationale and agreed to [facility]

## 2024-06-26 NOTE — Assessment & Plan Note (Signed)
 Hypertension is managed with losartan  and metoprolol . A recent increase in losartan  to 100 mg was made due to elevated blood pressure, possibly related to knee pain. Continue losartan  100 mg daily and metoprolol  50 mg daily.

## 2024-06-26 NOTE — Patient Instructions (Signed)
 It was a pleasure seeing you today! Your health and satisfaction are our top priorities.  Jaclyn Cone, MD  VISIT SUMMARY: Today, you came in for a routine follow-up and to evaluate your knee pain. We discussed your ongoing knee pain, particularly in your left knee, which has been problematic since your knee replacement in 2006. We also reviewed your chronic pain, insomnia, hypertension, anxiety, history of urinary tract infection, and vitamin deficiencies.  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 We will continue your current pain management with hydrocodone  and Celebrex . We will also consider an intraarticular hyaluronic acid injection after consulting with a specialist. Regular knee taps will be done to monitor for infection, and we will consult with an orthopedic specialist for further evaluation. Knee replacement will be avoided unless absolutely necessary due to the high risk of complications.  -CHRONIC PAIN AND INSOMNIA SECONDARY TO PAIN: Your chronic knee pain is contributing to your insomnia. We will continue your current pain management with hydrocodone  and Celebrex . We may consider adding gabapentin for pain management and explore the use of Jurnavix if your insurance covers it. Using a knee brace during sleep may also help.  -HYPERTENSION: Your high blood pressure is being managed with losartan  and metoprolol . We recently increased your losartan  to 100 mg due to elevated readings, possibly related to your knee pain. Continue taking losartan  100 mg daily and metoprolol  50 mg daily.  -ANXIETY: Your anxiety has improved since your husband's departure and is being managed with buspirone . Continue taking your current dose of buspirone  and monitor your symptoms. We will adjust your treatment as  needed.  -URINARY TRACT INFECTION DUE TO PROTEUS SPECIES: You have a history of urinary tract infection caused by Proteus bacteria, which can lead to stone formation. We will order a urine culture to ensure the previous infection has resolved.  -VITAMIN D  DEFICIENCY: You have a vitamin D  deficiency and are currently taking supplements. Continue your vitamin D  supplementation.  -THIAMINE (VITAMIN B1) DEFICIENCY: You have a thiamine deficiency, and we need to recheck your levels. We will order a blood test to check your thiamine levels.  INSTRUCTIONS: Please follow up with the orthopedic specialist for further evaluation of your knee. Continue your current medications as prescribed. We will order a urine culture to check for any remaining infection and a blood test to recheck your thiamine levels. Consider using a knee brace during sleep to help with pain management. If you have any new or worsening symptoms, please contact our office.  Your Providers PCP: [name], MD,  [phone]) Referring Provider: [name], MD,  [phone]) Care Team Provider: Ishmael Slough, MD,  [phone]) Care Team Provider: Kerman Vina HERO, NP,  [phone]) Care Team Provider: Murrell Drivers, MD,  [phone]) Care Team Provider: Raford Riggs, MD,  [phone]) Care Team Provider: Loretha Ash, MD,  [phone]) Care Team Provider: Aneita Gwendlyn DASEN, MD  NEXT STEPS: [x]  Early Intervention: Schedule sooner appointment, call our on-call services, or go to emergency room if there is any significant Increase in pain or discomfort New or worsening symptoms Sudden or severe changes in your health [x]  Flexible Follow-Up: We recommend a No follow-ups on file. for optimal routine care. This allows for progress monitoring and treatment adjustments. [x]  Preventive Care: Schedule your annual preventive care visit! It's typically covered by insurance and  helps identify potential health  issues early. [x]  Lab & X-ray Appointments: Incomplete tests scheduled today, or call to schedule. X-rays: [facility] (M-F, 8:30am-noon or 1pm-5pm). [x]  Medical Information Release: Sign a release form at front desk to obtain relevant medical information we don't have.  MAKING THE MOST OF OUR FOCUSED 20 MINUTE APPOINTMENTS: [x]   Clearly state your top concerns at the beginning of the visit to focus our discussion [x]   If you anticipate you will need more time, please inform the front desk during scheduling - we can book multiple appointments in the same week. [x]   If you have transportation problems- use our convenient video appointments or ask about transportation support. [x]   We can get down to business faster if you use MyChart to update information before the visit and submit non-urgent questions before your visit. Thank you for taking the time to provide details through MyChart.  Let our nurse know and she can import this information into your encounter documents.  Arrival and Wait Times: [x]   Arriving on time ensures that everyone receives prompt attention. [x]   Early morning (8a) and afternoon (1p) appointments tend to have shortest wait times. [x]   Unfortunately, we cannot delay appointments for late arrivals or hold slots during phone calls.  Getting Answers and Following Up [x]   Simple Questions & Concerns: For quick questions or basic follow-up after your visit, reach us  at [phone] or MyChart messaging. [x]   Complex Concerns: If your concern is more complex, scheduling an appointment might be best. Discuss this with the staff to find the most suitable option. [x]   Lab & Imaging Results: We'll contact you directly if results are abnormal or you don't use MyChart. Most normal results will be on MyChart within 2-3 business days, with a review message from Dr. Jesus. Haven't heard back in 2 weeks? Need results sooner? Contact us  at [phone]. [x]    Referrals: Our referral coordinator will manage specialist referrals. The specialist's office should contact you within 2 weeks to schedule an appointment. Call us  if you haven't heard from them after 2 weeks.  Staying Connected [x]   MyChart: Activate your MyChart for the fastest way to access results and message us . See the last page of this paperwork for instructions on how to activate.  Bring to Your Next Appointment [x]   Medications: Please bring all your medication bottles to your next appointment to ensure we have an accurate record of your prescriptions. [x]   Health Diaries: If you're monitoring any health conditions at home, keeping a diary of your readings can be very helpful for discussions at your next appointment.  Billing [x]   X-ray & Lab Orders: These are billed by separate companies. Contact the invoicing company directly for questions or concerns. [x]   Visit Charges: Discuss any billing inquiries with our administrative services team.  Your Satisfaction Matters [x]   Share Your Experience: We strive for your satisfaction! If you have any complaints, or preferably compliments, please let Dr. Jesus know directly or contact our Practice Administrators, Manuelita Rubin or [employer], by asking at the front desk.   Reviewing Your Records [x]   Review this early draft of your clinical encounter notes below and the final encounter summary tomorrow on MyChart after its been completed.  All orders placed so far are visible here: Immunization due -     Flu vaccine HIGH DOSE PF(Fluzone Trivalent) -     AMB Referral [facility]  Urinary tract infection without hematuria, site unspecified -  Urinalysis w microscopic + reflex cultur -     AMB Referral [facility]  Arthritis of knee -     Journavx; Take 1 tablet by mouth 2 (two) times daily as needed.  Dispense: 60 tablet; Refill: 5 -     AMB Referral [facility]  Intractable pain -     Journavx; Take  1 tablet by mouth 2 (two) times daily as needed.  Dispense: 60 tablet; Refill: 5 -     AMB Referral [facility]  NSVT (nonsustained ventricular tachycardia) (HCC) -     AMB Referral [facility] -     B12 and Folate Panel -     CBC with Differential/Platelet -     Comprehensive metabolic panel with GFR  Thiamine deficiency -     AMB Referral [facility] -     Vitamin B1  Marital estrangement -     AMB Referral [facility]  Primary hypertension  Anxiety disorder, unspecified type  Vitamin D  deficiency

## 2024-06-26 NOTE — Progress Notes (Signed)
 ==============================  [facility]: [phone]   -- Medical Office Visit --  Patient: [name]      Age: 73 y.o.       Sex:  female  Date:   06/26/2024 Today's Healthcare Provider: Bernardino KANDICE Cone, MD  ==============================   Chief Complaint: Hypertension and Discuss lab results  Discussed the use of AI scribe software for clinical note transcription with the patient, who gave verbal consent to proceed. History of Present Illness 73 year old female with rheumatoid arthritis and lupus who presents for a routine follow-up and evaluation of knee pain.  She experiences significant knee pain, particularly in her left knee, which has been present since her knee replacement in 2006. The knee is puffy, and she wears a brace for support. Severe pain occurs when twisting the knee, leading to swelling and episodes where she cannot walk, requiring EMS assistance. A recent synovial fluid analysis showed 44,000 nucleated cells with 59% neutrophils and 5% eosinophils, but no growth on culture. A bone scan showed increased blood flow and uptake around the knee. Her orthopedic doctor expressed concerns about knee replacement due to her rheumatoid arthritis and lupus.  She has a history of lupus and rheumatoid arthritis, which complicates her knee issues. Her orthopedic doctor has expressed concerns about knee replacement due to these autoimmune conditions. She experiences pain relief with Celebrex , which she takes twice daily, and hydrocodone  as needed for severe pain. She also takes metoprolol  50 mg for blood pressure and losartan , which was recently increased to 100 mg due to high blood pressure readings, possibly related to knee pain.  Recent blood work from August showed low neutrophils and high monocytes, with a past urinary tract infection caused by Proteus bacteria, which was not treated with antibiotics. No fever is reported but she  experiences fatigue and disrupted sleep, sleeping only four hours at a time due to knee pain.  Socially, her husband left her recently, which has surprisingly improved her sleep and stress levels. She feels calmer and more rested since his departure. She manages her household independently and is coping better with her daughter, who has shown improvement in her behavior and responsibilities.  Lab Results  Component Value Date   CHOL 153 06/29/2021   CHOL 159 12/30/2020   CHOL 143 11/24/2007   HDL 57 06/29/2021   HDL 61.30 12/30/2020   HDL 55 11/24/2007   LDLCALC 80 06/29/2021   LDLCALC 76 12/30/2020   LDLCALC 71 11/24/2007   TRIG 83 06/29/2021   TRIG 109.0 12/30/2020   TRIG 86 11/24/2007   CHOLHDL 3 12/30/2020   CHOLHDL 2.6 Ratio 11/24/2007    Background Reviewed: Problem List: has Malignant neoplasm of upper-outer quadrant of right breast in female, estrogen receptor positive (HCC); HTN (hypertension); Hiatal hernia; Systemic lupus erythematosus (HCC); Hypokalemia; Personal history of colon cancer; History of breast cancer; Family history of coronary artery disease in father; Hyperlipidemia with target LDL less than 130; LVH (left ventricular hypertrophy) due to hypertensive disease, without heart failure; Weight disorder; Primary localized osteoarthrosis of multiple sites; Rheumatoid arthritis (HCC); GERD (gastroesophageal reflux disease); Difficulty standing; Hand arthritis; Aortic atherosclerosis; Diverticular disease; Chronic idiopathic constipation; DDD (degenerative disc disease), thoracic; Abnormal finding on imaging of liver; Chronic kidney disease, stage 2 (mild); Diastolic dysfunction; NSVT (nonsustained ventricular tachycardia) (HCC); Adjustment disorder; Chronic pain of left knee; Other specified anxiety disorders; Other microscopic hematuria; Neutropenia; Proteinuria; Frequent urination; Other fatigue; Intractable pain; and Arthritis of knee on  their problem list. Past Medical  History:  has a past medical history of Acute pain of right shoulder (11/01/2022), Allergy (1967), Anxiety, Aortic atherosclerosis (11/01/2022), Arthritis, Back pain, Blood transfusion, Blood transfusion without reported diagnosis, Breast cancer (HCC) (2009), Cataract (12/2023), Chicken pox, Colon cancer (HCC) (2003), Depression, Difficulty standing (09/08/2022), Diverticular disease (11/01/2022), Diverticulitis, Edema of both ankles, Elevated amylase (11/22/2022), Family history of breast cancer (10/24/2017), Flank pain (11/03/2007), Gallbladder problem, Gastric wall thickening (12/15/2022), Gastritis, Generalized abdominal pain (12/15/2022), GERD (gastroesophageal reflux disease), Heart murmur (1954), Hepatitis, Hiatal hernia, History of stomach ulcers, Hypertension, Insomnia (02/17/2021), Intractable abdominal pain (12/15/2022), Joint pain, Lactose intolerance, Lower extremity edema (01/11/2022), Lupus, Multiple gastric ulcers, Palpitations, Personal history of radiation therapy (2009), Prediabetes (09/08/2022), Rheumatic fever, Rheumatoid arthritis (HCC), SLE (systemic lupus erythematosus related syndrome) (HCC), Upper abdominal pain (11/01/2022), Urinary incontinence, and UTI (urinary tract infection). Past Surgical History:   has a past surgical history that includes Tonsillectomy; Appendectomy; Abdominal hysterectomy; Tubal ligation; Breast surgery; Cholecystectomy; Total knee arthroplasty (08/30/2011); Replacement total knee (Left); Esophagogastroduodenoscopy (egd) with propofol  (N/A, 07/25/2018); biopsy (07/25/2018); Breast excisional biopsy (Left, 1998); Breast lumpectomy (Bilateral); and Joint replacement (2006, 2013). Social History:   reports that she has never smoked. She has never used smokeless tobacco. She reports that she does not drink alcohol  and does not use drugs. Family History:  family history includes Arthritis in her maternal grandmother; Breast cancer (age of onset: 72) in her maternal  aunt; Breast cancer (age of onset: 61) in her mother; Cancer in her mother and another family member; Cancer (age of onset: 67) in her paternal aunt; Cancer (age of onset: 48) in her paternal grandmother; Depression in her mother; Heart attack in her father; Heart disease in her father; Hypertension in her brother, mother, sister, sister, and another family member; Kidney disease in her brother; Other in her father; Other (age of onset: 22) in her maternal grandmother; Stroke in her mother; Sudden death in her father. Allergies:  is allergic to iodine, iohexol , latex, penicillins, celecoxib , lactose, other, penicillin g, and sulfa  antibiotics.   Medication Reconciliation: Current Outpatient Medications on File Prior to Visit  Medication Sig   HYDROcodone -acetaminophen  (NORCO/VICODIN) 5-325 MG tablet Take 1 tablet by mouth every 6 (six) hours as needed for moderate pain (pain score 4-6) or severe pain (pain score 7-10).   losartan -hydrochlorothiazide  (HYZAAR) 100-12.5 MG tablet Take 1 tablet by mouth daily.   metoprolol  succinate (TOPROL -XL) 50 MG 24 hr tablet Take 1 tablet (50 mg total) by mouth daily.   Multiple Vitamins-Minerals (EMERGEN-C IMMUNE PLUS PO) Take 1 tablet by mouth daily.   busPIRone  (BUSPAR ) 10 MG tablet Take 2 tablets (20 mg total) by mouth 2 (two) times daily.   celecoxib  (CELEBREX ) 50 MG capsule Take 1 capsule (50 mg total) by mouth 2 (two) times daily.   famotidine -calcium  carbonate-magnesium  hydroxide (PEPCID  COMPLETE) 10-800-165 MG chewable tablet CHEW 1 TABLET 2 TIMES DAILYAS NEEDED.   hydroxychloroquine (PLAQUENIL) 200 MG tablet Take 200 mg by mouth 2 (two) times daily.   No current facility-administered medications on file prior to visit.  There are no discontinued medications.   Physical Exam:    06/26/2024   10:54 AM 06/08/2024    8:59 AM 06/08/2024    8:29 AM  Vitals with BMI  Height 5' 7  5' 7  Weight 188 lbs 6 oz  189 lbs  BMI 29.5  29.59  Systolic 120 132  140  Diastolic 70 78 90  Pulse 62  64  Vital  signs reviewed.  Nursing notes reviewed. Weight trend reviewed. Physical Activity: Insufficiently Active (03/09/2024)   Exercise Vital Sign    Days of Exercise per Week: 4 days    Minutes of Exercise per Session: 30 min   General Appearance:  No acute distress appreciable.   Well-groomed, healthy-appearing female.  Well proportioned with no abnormal fat distribution.  Good muscle tone. Pulmonary:  Normal work of breathing at rest, no respiratory distress apparent. SpO2: 98 %  Musculoskeletal: All extremities are intact.  Neurological:  Awake, alert, oriented, and engaged.  No obvious focal neurological deficits or cognitive impairments.  Sensorium seems unclouded.   Speech is clear and coherent with logical content. Psychiatric:  Appropriate mood, pleasant and cooperative demeanor, thoughtful and engaged during the exam  Verbalized to patient: Physical Exam MUSCULOSKELETAL: Left knee exhibits effusion.  Results LABS Neutrophils: Low (05/01/2024) Monocytes: High (05/01/2024) Urine culture: Proteus growth (05/01/2024)  RADIOLOGY Bone scan: Increased blood flow and uptake around knee prosthesis  PATHOLOGY Synovial fluid cytology: 4000 nucleated cells, 59% neutrophils, 5% eosinophils (05/29/2024) Synovial fluid culture: No growth (05/29/2024)     06/26/2024   11:00 AM 02/07/2024    3:01 PM 01/24/2024    8:15 AM 10/31/2023   10:43 AM  PHQ 2/9 Scores  PHQ - 2 Score 0 0 0 0  PHQ- 9 Score 0 0     Office Visit on 05/01/2024  Component Date Value Ref Range Status   SARS Coronavirus 2 Ag 05/01/2024 Negative  Negative Final   WBC 05/01/2024 4.1  4.0 - 10.5 K/uL Final   RBC 05/01/2024 3.94  3.87 - 5.11 Mil/uL Final   Hemoglobin 05/01/2024 12.3  12.0 - 15.0 g/dL Final   HCT 91/87/7974 36.7  36.0 - 46.0 % Final   MCV 05/01/2024 93.1  78.0 - 100.0 fl Final   MCHC 05/01/2024 33.6  30.0 - 36.0 g/dL Final   RDW 91/87/7974 13.8  11.5 - 15.5 %  Final   Platelets 05/01/2024 205.0  150.0 - 400.0 K/uL Final   Neutrophils Relative % 05/01/2024 40.6 (L)  43.0 - 77.0 % Final   Lymphocytes Relative 05/01/2024 36.9  12.0 - 46.0 % Final   Monocytes Relative 05/01/2024 17.8 (H)  3.0 - 12.0 % Final   Eosinophils Relative 05/01/2024 4.1  0.0 - 5.0 % Final   Basophils Relative 05/01/2024 0.6  0.0 - 3.0 % Final   Neutro Abs 05/01/2024 1.7  1.4 - 7.7 K/uL Final   Lymphs Abs 05/01/2024 1.5  0.7 - 4.0 K/uL Final   Monocytes Absolute 05/01/2024 0.7  0.1 - 1.0 K/uL Final   Eosinophils Absolute 05/01/2024 0.2  0.0 - 0.7 K/uL Final   Basophils Absolute 05/01/2024 0.0  0.0 - 0.1 K/uL Final   Sodium 05/01/2024 140  135 - 145 mEq/L Final   Potassium 05/01/2024 3.7  3.5 - 5.1 mEq/L Final   Chloride 05/01/2024 105  96 - 112 mEq/L Final   CO2 05/01/2024 29  19 - 32 mEq/L Final   Glucose, Bld 05/01/2024 86  70 - 99 mg/dL Final   BUN 91/87/7974 11  6 - 23 mg/dL Final   Creatinine, Ser 05/01/2024 0.67  0.40 - 1.20 mg/dL Final   Total Bilirubin 05/01/2024 0.4  0.2 - 1.2 mg/dL Final   Alkaline Phosphatase 05/01/2024 88  39 - 117 U/L Final   AST 05/01/2024 15  0 - 37 U/L Final   ALT 05/01/2024 15  0 - 35 U/L Final   Total Protein 05/01/2024  6.8  6.0 - 8.3 g/dL Final   Albumin  05/01/2024 3.7  3.5 - 5.2 g/dL Final   GFR 91/87/7974 85.63  >60.00 mL/min Final   Calcium  05/01/2024 9.3  8.4 - 10.5 mg/dL Final   Sed Rate 91/87/7974 19  0 - 30 mm/hr Final   CRP 05/01/2024 <1.0  0.5 - 20.0 mg/dL Final   Vitamin A-87 91/87/7974 311  211 - 911 pg/mL Final   Folate 05/01/2024 17.5  >5.9 ng/mL Final   C3 Complement 05/01/2024 181  83 - 193 mg/dL Final   C4 Complement 91/87/7974 29  15 - 57 mg/dL Final   VITD 91/87/7974 28.39 (L)  30.00 - 100.00 ng/mL Final   Iron 05/01/2024 92  45 - 160 mcg/dL Final   TIBC 91/87/7974 373  250 - 450 mcg/dL (calc) Final   %SAT 91/87/7974 25  16 - 45 % (calc) Final   Ferritin 05/01/2024 29  16 - 288 ng/mL Final   Color, Urine  05/01/2024 DARK YELLOW  YELLOW Final   APPearance 05/01/2024 CLEAR  CLEAR Final   Specific Gravity, Urine 05/01/2024 1.022  1.001 - 1.035 Final   pH 05/01/2024 6.5  5.0 - 8.0 Final   Glucose, UA 05/01/2024 NEGATIVE  NEGATIVE Final   Bilirubin Urine 05/01/2024 NEGATIVE  NEGATIVE Final   Ketones, ur 05/01/2024 TRACE (A)  NEGATIVE Final   Hgb urine dipstick 05/01/2024 NEGATIVE  NEGATIVE Final   Protein, ur 05/01/2024 1+ (A)  NEGATIVE Final   Nitrites, Initial 05/01/2024 NEGATIVE  NEGATIVE Final   Leukocyte Esterase 05/01/2024 TRACE (A)  NEGATIVE Final   WBC, UA 05/01/2024 NONE SEEN  0 - 5 /HPF Final   RBC / HPF 05/01/2024 0-2  0 - 2 /HPF Final   Squamous Epithelial / HPF 05/01/2024 0-5  < OR = 5 /HPF Final   Bacteria, UA 05/01/2024 NONE SEEN  NONE SEEN /HPF Final   Hyaline Cast 05/01/2024 NONE SEEN  NONE SEEN /LPF Final   Creatinine, Urine 05/01/2024 200  20 - 275 mg/dL Final   Protein/Creat Ratio 05/01/2024 105  24 - 184 mg/g creat Final   Protein/Creatinine Ratio 05/01/2024 0.105  0.024 - 0.184 mg/mg creat Final   Total Protein, Urine 05/01/2024 21  5 - 24 mg/dL Final   TSH 91/87/7974 0.705  0.450 - 4.500 uIU/mL Final   Extra Urine Specimen 05/01/2024    Final   Extra tube recieved 05/01/2024    Final   Specimen type recieved 05/01/2024 Serum Separator (SST)   Final   MICRO NUMBER: 05/01/2024 [id number]   Final   SPECIMEN QUALITY: 05/01/2024 Adequate   Final   Sample Source 05/01/2024 URINE   Final   STATUS: 05/01/2024 FINAL   Final   ISOLATE 1: 05/01/2024 Proteus mirabilis (A)   Final   REFLEXIVE URINE CULTURE 05/01/2024    Final  Admission on 04/09/2024, Discharged on 04/09/2024  Component Date Value Ref Range Status   Sodium 04/09/2024 139  135 - 145 mmol/L Final   Potassium 04/09/2024 3.9  3.5 - 5.1 mmol/L Final   Chloride 04/09/2024 103  98 - 111 mmol/L Final   CO2 04/09/2024 24  22 - 32 mmol/L Final   Glucose, Bld 04/09/2024 94  70 - 99 mg/dL Final   BUN 92/78/7974 8  8 - 23  mg/dL Final   Creatinine, Ser 04/09/2024 0.77  0.44 - 1.00 mg/dL Final   Calcium  04/09/2024 9.6  8.9 - 10.3 mg/dL Final   GFR, Estimated 04/09/2024 >60  >60  mL/min Final   Anion gap 04/09/2024 12  5 - 15 Final   WBC 04/09/2024 4.7  4.0 - 10.5 K/uL Final   RBC 04/09/2024 3.80 (L)  3.87 - 5.11 MIL/uL Final   Hemoglobin 04/09/2024 11.9 (L)  12.0 - 15.0 g/dL Final   HCT 92/78/7974 35.6 (L)  36.0 - 46.0 % Final   MCV 04/09/2024 93.7  80.0 - 100.0 fL Final   MCH 04/09/2024 31.3  26.0 - 34.0 pg Final   MCHC 04/09/2024 33.4  30.0 - 36.0 g/dL Final   RDW 92/78/7974 12.9  11.5 - 15.5 % Final   Platelets 04/09/2024 185  150 - 400 K/uL Final   nRBC 04/09/2024 0.0  0.0 - 0.2 % Final   Troponin T High Sensitivity 04/09/2024 <15  <19 ng/L Final   Total Protein 04/09/2024 7.1  6.5 - 8.1 g/dL Final   Albumin  04/09/2024 4.2  3.5 - 5.0 g/dL Final   AST 92/78/7974 28  15 - 41 U/L Final   ALT 04/09/2024 17  0 - 44 U/L Final   Alkaline Phosphatase 04/09/2024 143 (H)  38 - 126 U/L Final   Total Bilirubin 04/09/2024 0.3  0.0 - 1.2 mg/dL Final   Bilirubin, Direct 04/09/2024 0.1  0.0 - 0.2 mg/dL Final   Indirect Bilirubin 04/09/2024 0.2 (L)  0.3 - 0.9 mg/dL Final   Lipase 92/78/7974 16  11 - 51 U/L Final   SARS Coronavirus 2 by RT PCR 04/09/2024 POSITIVE (A)  NEGATIVE Final   Influenza A by PCR 04/09/2024 NEGATIVE  NEGATIVE Final   Influenza B by PCR 04/09/2024 NEGATIVE  NEGATIVE Final   Resp Syncytial Virus by PCR 04/09/2024 NEGATIVE  NEGATIVE Final  Office Visit on 03/09/2024  Component Date Value Ref Range Status   Creatinine, Urine 03/09/2024 215  20 - 275 mg/dL Final   Protein/Creat Ratio 03/09/2024 88  24 - 184 mg/g creat Final   Protein/Creatinine Ratio 03/09/2024 0.088  0.024 - 0.184 mg/mg creat Final   Total Protein, Urine 03/09/2024 19  5 - 24 mg/dL Final   Color, Urine 93/79/7974 YELLOW  YELLOW Final   APPearance 03/09/2024 CLEAR  CLEAR Final   Specific Gravity, Urine 03/09/2024 1.024  1.001 -  1.035 Final   pH 03/09/2024 6.0  5.0 - 8.0 Final   Glucose, UA 03/09/2024 NEGATIVE  NEGATIVE Final   Bilirubin Urine 03/09/2024 NEGATIVE  NEGATIVE Final   Ketones, ur 03/09/2024 TRACE (A)  NEGATIVE Final   Hgb urine dipstick 03/09/2024 NEGATIVE  NEGATIVE Final   Protein, ur 03/09/2024 1+ (A)  NEGATIVE Final   Nitrites, Initial 03/09/2024 NEGATIVE  NEGATIVE Final   Leukocyte Esterase 03/09/2024 NEGATIVE  NEGATIVE Final   WBC, UA 03/09/2024 0-5  0 - 5 /HPF Final   RBC / HPF 03/09/2024 0-2  0 - 2 /HPF Final   Squamous Epithelial / HPF 03/09/2024 0-5  < OR = 5 /HPF Final   Bacteria, UA 03/09/2024 FEW (A)  NONE SEEN /HPF Final   Hyaline Cast 03/09/2024 NONE SEEN  NONE SEEN /LPF Final   Note 03/09/2024    Final   Reflexve Urine Culture 03/09/2024    Final  Office Visit on 03/07/2024  Component Date Value Ref Range Status   SARS Coronavirus 2 Ag 03/07/2024 Negative  Negative Final   Influenza A, POC 03/07/2024 Negative  Negative Final   Influenza B, POC 03/07/2024 Negative  Negative Final  Office Visit on 02/07/2024  Component Date Value Ref Range Status  Color, UA 02/07/2024 YELLOW   Final   Clarity, UA 02/07/2024 CLEAR   Final   Glucose, UA 02/07/2024 Negative  Negative Final   Bilirubin, UA 02/07/2024 NEG   Final   Ketones, UA 02/07/2024 NEG   Final   Spec Grav, UA 02/07/2024 1.020  1.010 - 1.025 Final   Blood, UA 02/07/2024 NEG   Final   pH, UA 02/07/2024 6.0  5.0 - 8.0 Final   Protein, UA 02/07/2024 Negative  Negative Final   Urobilinogen, UA 02/07/2024 0.2  0.2 or 1.0 E.U./dL Final   Nitrite, UA 94/79/7974 NEG   Final   Leukocytes, UA 02/07/2024 Negative  Negative Final   MICRO NUMBER: 02/07/2024 [id number]   Final   SPECIMEN QUALITY: 02/07/2024 Adequate   Final   Sample Source 02/07/2024 URINE   Final   STATUS: 02/07/2024 FINAL   Final   Result: 02/07/2024 Less than 10,000 CFU/mL of single Gram positive organism isolated. No further testing will be performed. If clinically  indicated, recollection using a method to minimize contamination, with prompt transfer to Urine Culture Transport Tube, is recommended.   Final  Appointment on 01/05/2024  Component Date Value Ref Range Status   Sodium 01/05/2024 140  135 - 145 mmol/L Final   Potassium 01/05/2024 3.5  3.5 - 5.1 mmol/L Final   Chloride 01/05/2024 105  98 - 111 mmol/L Final   CO2 01/05/2024 30  22 - 32 mmol/L Final   Glucose, Bld 01/05/2024 83  70 - 99 mg/dL Final   BUN 95/82/7974 15  8 - 23 mg/dL Final   Creatinine 95/82/7974 0.82  0.44 - 1.00 mg/dL Final   Calcium  01/05/2024 9.6  8.9 - 10.3 mg/dL Final   Total Protein 95/82/7974 7.2  6.5 - 8.1 g/dL Final   Albumin  01/05/2024 4.1  3.5 - 5.0 g/dL Final   AST 95/82/7974 20  15 - 41 U/L Final   ALT 01/05/2024 13  0 - 44 U/L Final   Alkaline Phosphatase 01/05/2024 85  38 - 126 U/L Final   Total Bilirubin 01/05/2024 0.5  0.0 - 1.2 mg/dL Final   GFR, Estimated 01/05/2024 >60  >60 mL/min Final   Anion gap 01/05/2024 5  5 - 15 Final   Specimen Description 01/05/2024    Final                   Value:URINE, CLEAN CATCH Performed at [facility], [address] [place], [place] [address]    Special Requests 01/05/2024    Final                   Value:NONE Performed at [facility], [address], [place], [place] [address]    Culture 01/05/2024    Final                   Value:NO GROWTH Performed at [facility], [address], [place], [place] [address]    Report Status 01/05/2024 01/06/2024 FINAL   Final   Color, Urine 01/05/2024 YELLOW  YELLOW Final   APPearance 01/05/2024 CLEAR  CLEAR Final   Specific Gravity, Urine 01/05/2024 1.021  1.005 - 1.030 Final   pH 01/05/2024 5.0  5.0 - 8.0 Final   Glucose, UA 01/05/2024 NEGATIVE  NEGATIVE mg/dL Final   Hgb urine dipstick 01/05/2024 NEGATIVE  NEGATIVE Final   Bilirubin Urine 01/05/2024 NEGATIVE  NEGATIVE Final   Ketones, ur 01/05/2024 NEGATIVE   NEGATIVE  mg/dL Final   Protein, ur 95/82/7974 30 (A)  NEGATIVE mg/dL Final   Nitrite 95/82/7974 NEGATIVE  NEGATIVE Final   Leukocytes,Ua 01/05/2024 NEGATIVE  NEGATIVE Final   RBC / HPF 01/05/2024 0-5  0 - 5 RBC/hpf Final   WBC, UA 01/05/2024 0-5  0 - 5 WBC/hpf Final   Bacteria, UA 01/05/2024 NONE SEEN  NONE SEEN Final   Squamous Epithelial / HPF 01/05/2024 0-5  0 - 5 /HPF Final   Mucus 01/05/2024 PRESENT   Final   Hyaline Casts, UA 01/05/2024 PRESENT   Final   WBC 01/05/2024 3.5 (L)  4.0 - 10.5 K/uL Final   RBC 01/05/2024 3.89  3.87 - 5.11 MIL/uL Final   Hemoglobin 01/05/2024 12.3  12.0 - 15.0 g/dL Final   HCT 95/82/7974 35.8 (L)  36.0 - 46.0 % Final   MCV 01/05/2024 92.0  80.0 - 100.0 fL Final   MCH 01/05/2024 31.6  26.0 - 34.0 pg Final   MCHC 01/05/2024 34.4  30.0 - 36.0 g/dL Final   RDW 95/82/7974 12.6  11.5 - 15.5 % Final   Platelets 01/05/2024 155  150 - 400 K/uL Final   nRBC 01/05/2024 0.0  0.0 - 0.2 % Final   Neutrophils Relative % 01/05/2024 23  % Final   Neutro Abs 01/05/2024 0.8 (L)  1.7 - 7.7 K/uL Final   Lymphocytes Relative 01/05/2024 54  % Final   Lymphs Abs 01/05/2024 1.9  0.7 - 4.0 K/uL Final   Monocytes Relative 01/05/2024 19  % Final   Monocytes Absolute 01/05/2024 0.7  0.1 - 1.0 K/uL Final   Eosinophils Relative 01/05/2024 3  % Final   Eosinophils Absolute 01/05/2024 0.1  0.0 - 0.5 K/uL Final   Basophils Relative 01/05/2024 1  % Final   Basophils Absolute 01/05/2024 0.0  0.0 - 0.1 K/uL Final   Immature Granulocytes 01/05/2024 0  % Final   Abs Immature Granulocytes 01/05/2024 0.01  0.00 - 0.07 K/uL Final  Office Visit on 10/31/2023  Component Date Value Ref Range Status   Color, Urine 10/31/2023 YELLOW  YELLOW Final   APPearance 10/31/2023 CLEAR  CLEAR Final   Specific Gravity, Urine 10/31/2023 1.026  1.001 - 1.035 Final   pH 10/31/2023 6.0  5.0 - 8.0 Final   Glucose, UA 10/31/2023 NEGATIVE  NEGATIVE Final   Bilirubin Urine 10/31/2023 NEGATIVE  NEGATIVE Final    Ketones, ur 10/31/2023 NEGATIVE  NEGATIVE Final   Hgb urine dipstick 10/31/2023 NEGATIVE  NEGATIVE Final   Protein, ur 10/31/2023 TRACE (A)  NEGATIVE Final   Nitrites, Initial 10/31/2023 NEGATIVE  NEGATIVE Final   Leukocyte Esterase 10/31/2023 NEGATIVE  NEGATIVE Final   WBC, UA 10/31/2023 NONE SEEN  0 - 5 /HPF Final   RBC / HPF 10/31/2023 NONE SEEN  0 - 2 /HPF Final   Squamous Epithelial / HPF 10/31/2023 NONE SEEN  < OR = 5 /HPF Final   Bacteria, UA 10/31/2023 NONE SEEN  NONE SEEN /HPF Final   Hyaline Cast 10/31/2023 NONE SEEN  NONE SEEN /LPF Final   Note 10/31/2023    Final   Reflexve Urine Culture 10/31/2023    Final  Admission on 07/22/2023, Discharged on 07/22/2023  Component Date Value Ref Range Status   SARS Coronavirus 2 by RT PCR 07/22/2023 NEGATIVE  NEGATIVE Final   Influenza A by PCR 07/22/2023 NEGATIVE  NEGATIVE Final   Influenza B by PCR 07/22/2023 NEGATIVE  NEGATIVE Final   Resp Syncytial Virus by PCR 07/22/2023 NEGATIVE  NEGATIVE  Final   Group A Strep by PCR 07/22/2023 NOT DETECTED  NOT DETECTED Final   Lactic Acid, Venous 07/22/2023 0.9  0.5 - 1.9 mmol/L Final   Sodium 07/22/2023 138  135 - 145 mmol/L Final   Potassium 07/22/2023 3.5  3.5 - 5.1 mmol/L Final   Chloride 07/22/2023 105  98 - 111 mmol/L Final   CO2 07/22/2023 26  22 - 32 mmol/L Final   Glucose, Bld 07/22/2023 93  70 - 99 mg/dL Final   BUN 88/98/7975 13  8 - 23 mg/dL Final   Creatinine, Ser 07/22/2023 0.66  0.44 - 1.00 mg/dL Final   Calcium  07/22/2023 10.0  8.9 - 10.3 mg/dL Final   Total Protein 88/98/7975 7.0  6.5 - 8.1 g/dL Final   Albumin  07/22/2023 3.9  3.5 - 5.0 g/dL Final   AST 88/98/7975 21  15 - 41 U/L Final   ALT 07/22/2023 13  0 - 44 U/L Final   Alkaline Phosphatase 07/22/2023 77  38 - 126 U/L Final   Total Bilirubin 07/22/2023 0.5  0.3 - 1.2 mg/dL Final   GFR, Estimated 07/22/2023 >60  >60 mL/min Final   Anion gap 07/22/2023 7  5 - 15 Final   WBC 07/22/2023 4.4  4.0 - 10.5 K/uL Final   RBC  07/22/2023 4.03  3.87 - 5.11 MIL/uL Final   Hemoglobin 07/22/2023 12.4  12.0 - 15.0 g/dL Final   HCT 88/98/7975 37.0  36.0 - 46.0 % Final   MCV 07/22/2023 91.8  80.0 - 100.0 fL Final   MCH 07/22/2023 30.8  26.0 - 34.0 pg Final   MCHC 07/22/2023 33.5  30.0 - 36.0 g/dL Final   RDW 88/98/7975 12.5  11.5 - 15.5 % Final   Platelets 07/22/2023 182  150 - 400 K/uL Final   nRBC 07/22/2023 0.0  0.0 - 0.2 % Final   Neutrophils Relative % 07/22/2023 56  % Final   Neutro Abs 07/22/2023 2.5  1.7 - 7.7 K/uL Final   Lymphocytes Relative 07/22/2023 26  % Final   Lymphs Abs 07/22/2023 1.2  0.7 - 4.0 K/uL Final   Monocytes Relative 07/22/2023 15  % Final   Monocytes Absolute 07/22/2023 0.7  0.1 - 1.0 K/uL Final   Eosinophils Relative 07/22/2023 3  % Final   Eosinophils Absolute 07/22/2023 0.1  0.0 - 0.5 K/uL Final   Basophils Relative 07/22/2023 0  % Final   Basophils Absolute 07/22/2023 0.0  0.0 - 0.1 K/uL Final   Immature Granulocytes 07/22/2023 0  % Final   Abs Immature Granulocytes 07/22/2023 0.01  0.00 - 0.07 K/uL Final   Prothrombin Time 07/22/2023 13.5  11.4 - 15.2 seconds Final   INR 07/22/2023 1.0  0.8 - 1.2 Final   aPTT 07/22/2023 27  24 - 36 seconds Final   Specimen Description 07/22/2023    Final                   Value:BLOOD LEFT ANTECUBITAL Performed at [facility], [address] [place], [place] [address]    Special Requests 07/22/2023    Final                   Value:BOTTLES DRAWN AEROBIC AND ANAEROBIC Blood Culture adequate volume Performed at [facility], [address], [place], [place] [address]    Culture 07/22/2023    Final                   Value:NO GROWTH  5 DAYS Performed at [facility], [address], [place], [place] [address]    Report Status 07/22/2023 07/27/2023 FINAL   Final   Specimen Description 07/22/2023    Final                   Value:BLOOD BLOOD LEFT ARM Performed at [facility], [address] [place], [place] [address]    Special Requests 07/22/2023    Final                   Value:BOTTLES DRAWN AEROBIC AND ANAEROBIC Blood Culture adequate volume Performed at [facility], [address], [place], [place] [address]    Culture 07/22/2023    Final                   Value:NO GROWTH 5 DAYS Performed at [facility], [address], [place], [place] [address]    Report Status 07/22/2023 07/27/2023 FINAL   Final   Specimen Source 07/22/2023 URINE, CLEAN CATCH   Final   Color, Urine 07/22/2023 YELLOW  YELLOW Final   APPearance 07/22/2023 CLEAR  CLEAR Final   Specific Gravity, Urine 07/22/2023 1.012  1.005 - 1.030 Final   pH 07/22/2023 7.0  5.0 - 8.0 Final   Glucose, UA 07/22/2023 NEGATIVE  NEGATIVE mg/dL Final   Hgb urine dipstick 07/22/2023 SMALL (A)  NEGATIVE Final   Bilirubin Urine 07/22/2023 NEGATIVE  NEGATIVE Final   Ketones, ur 07/22/2023 NEGATIVE  NEGATIVE mg/dL Final   Protein, ur 88/98/7975 NEGATIVE  NEGATIVE mg/dL Final   Nitrite 88/98/7975 NEGATIVE  NEGATIVE Final   Leukocytes,Ua 07/22/2023 NEGATIVE  NEGATIVE Final   RBC / HPF 07/22/2023 6-10  0 - 5 RBC/hpf Final   WBC, UA 07/22/2023 0-5  0 - 5 WBC/hpf Final   Bacteria, UA 07/22/2023 NONE SEEN  NONE SEEN Final   Squamous Epithelial / HPF 07/22/2023 0-5  0 - 5 /HPF Final   Mucus 07/22/2023 PRESENT   Final  Appointment on 06/30/2023  Component Date Value Ref Range Status   Sodium 06/30/2023 140  135 - 145 mmol/L Final   Potassium 06/30/2023 3.7  3.5 - 5.1 mmol/L Final   Chloride 06/30/2023 107  98 - 111 mmol/L Final   CO2 06/30/2023 28  22 - 32 mmol/L Final   Glucose, Bld 06/30/2023 83  70 - 99 mg/dL Final   BUN 89/89/7975 10  8 - 23 mg/dL Final   Creatinine 89/89/7975 0.75  0.44 - 1.00 mg/dL Final   Calcium  06/30/2023 9.6  8.9 - 10.3 mg/dL Final   Total Protein 89/89/7975 7.0  6.5 - 8.1 g/dL Final   Albumin  06/30/2023 3.9  3.5 - 5.0 g/dL Final   AST 89/89/7975 20  15 - 41  U/L Final   ALT 06/30/2023 12  0 - 44 U/L Final   Alkaline Phosphatase 06/30/2023 80  38 - 126 U/L Final   Total Bilirubin 06/30/2023 0.5  0.3 - 1.2 mg/dL Final   GFR, Estimated 06/30/2023 >60  >60 mL/min Final   Anion gap 06/30/2023 5  5 - 15 Final   WBC 06/30/2023 3.2 (L)  4.0 - 10.5 K/uL Final   RBC 06/30/2023 3.86 (L)  3.87 - 5.11 MIL/uL Final   Hemoglobin 06/30/2023 12.3  12.0 - 15.0 g/dL Final   HCT 89/89/7975 36.4  36.0 - 46.0 % Final   MCV 06/30/2023 94.3  80.0 - 100.0 fL Final   [facility] 06/30/2023  31.9  26.0 - 34.0 pg Final   MCHC 06/30/2023 33.8  30.0 - 36.0 g/dL Final   RDW 89/89/7975 12.4  11.5 - 15.5 % Final   Platelets 06/30/2023 160  150 - 400 K/uL Final   nRBC 06/30/2023 0.0  0.0 - 0.2 % Final   Neutrophils Relative % 06/30/2023 28  % Final   Neutro Abs 06/30/2023 0.9 (L)  1.7 - 7.7 K/uL Final   Lymphocytes Relative 06/30/2023 52  % Final   Lymphs Abs 06/30/2023 1.7  0.7 - 4.0 K/uL Final   Monocytes Relative 06/30/2023 16  % Final   Monocytes Absolute 06/30/2023 0.5  0.1 - 1.0 K/uL Final   Eosinophils Relative 06/30/2023 3  % Final   Eosinophils Absolute 06/30/2023 0.1  0.0 - 0.5 K/uL Final   Basophils Relative 06/30/2023 1  % Final   Basophils Absolute 06/30/2023 0.0  0.0 - 0.1 K/uL Final   Immature Granulocytes 06/30/2023 0  % Final   Abs Immature Granulocytes 06/30/2023 0.00  0.00 - 0.07 K/uL Final  Office Visit on 05/05/2023  Component Date Value Ref Range Status   TSH 05/05/2023 0.89  0.35 - 5.50 uIU/mL Final   Sodium 05/05/2023 136  135 - 145 mEq/L Final   Potassium 05/05/2023 4.0  3.5 - 5.1 mEq/L Final   Chloride 05/05/2023 102  96 - 112 mEq/L Final   CO2 05/05/2023 26  19 - 32 mEq/L Final   Glucose, Bld 05/05/2023 79  70 - 99 mg/dL Final   BUN 91/84/7975 16  6 - 23 mg/dL Final   Creatinine, Ser 05/05/2023 0.73  0.40 - 1.20 mg/dL Final   Total Bilirubin 05/05/2023 0.4  0.2 - 1.2 mg/dL Final   Alkaline Phosphatase 05/05/2023 88  39 - 117 U/L Final   AST  05/05/2023 19  0 - 37 U/L Final   ALT 05/05/2023 12  0 - 35 U/L Final   Total Protein 05/05/2023 6.9  6.0 - 8.3 g/dL Final   Albumin  05/05/2023 4.0  3.5 - 5.2 g/dL Final   GFR 91/84/7975 81.13  >60.00 mL/min Final   Calcium  05/05/2023 9.9  8.4 - 10.5 mg/dL Final   WBC 91/84/7975 4.3  4.0 - 10.5 K/uL Final   RBC 05/05/2023 3.97  3.87 - 5.11 Mil/uL Final   Hemoglobin 05/05/2023 12.4  12.0 - 15.0 g/dL Final   HCT 91/84/7975 37.7  36.0 - 46.0 % Final   MCV 05/05/2023 94.8  78.0 - 100.0 fl Final   MCHC 05/05/2023 32.9  30.0 - 36.0 g/dL Final   RDW 91/84/7975 13.7  11.5 - 15.5 % Final   Platelets 05/05/2023 185.0  150.0 - 400.0 K/uL Final   Neutrophils Relative % 05/05/2023 33.8 (L)  43.0 - 77.0 % Final   Lymphocytes Relative 05/05/2023 47.1 (H)  12.0 - 46.0 % Final   Monocytes Relative 05/05/2023 16.1 (H)  3.0 - 12.0 % Final   Eosinophils Relative 05/05/2023 2.5  0.0 - 5.0 % Final   Basophils Relative 05/05/2023 0.5  0.0 - 3.0 % Final   Neutro Abs 05/05/2023 1.5  1.4 - 7.7 K/uL Final   Lymphs Abs 05/05/2023 2.0  0.7 - 4.0 K/uL Final   Monocytes Absolute 05/05/2023 0.7  0.1 - 1.0 K/uL Final   Eosinophils Absolute 05/05/2023 0.1  0.0 - 0.7 K/uL Final   Basophils Absolute 05/05/2023 0.0  0.0 - 0.1 K/uL Final   Hgb A1c MFr Bld 05/05/2023 5.4  4.6 - 6.5 % Final  Vitamin B-12 05/05/2023 326  211 - 911 pg/mL Final   Folate 05/05/2023 16.7  >5.9 ng/mL Final   VITD 05/05/2023 33.61  30.00 - 100.00 ng/mL Final  There may be more visits with results that are not included.  No image results found. NM Bone Scan 3 Phase Result Date: 06/11/2024 CLINICAL DATA:  LEFT knee pain.  Knee arthroplasty. EXAM: NUCLEAR MEDICINE 3-PHASE BONE SCAN TECHNIQUE: Radionuclide angiographic images, immediate static blood pool images, and 3-hour delayed static images were obtained of the after intravenous injection of radiopharmaceutical. RADIOPHARMACEUTICALS:  18.5 mCi Tc-34m MDP IV COMPARISON:  Radiograph and CT  04/21/2024 FINDINGS: Vascular phase: Mild increased blood flow to the LEFT knee. Blood pool phase: Mild asymmetric increased blood pool activity in the LEFT knee loca primarily above the joint Delayed phase: Asymmetric increased uptake within the periprosthetic bone of the LEFT knee involving the femoral condyles as well as the tibial plateau. IMPRESSION: Increased activity in the vascular phase and delayed phase associated with the LEFT knee prosthetic. Findings are concerning for loosening or infection of the LEFT knee prosthetic. Electronically Signed   By: Jackquline Boxer M.D.   On: 06/11/2024 14:39   CT Knee Left Wo Contrast Result Date: 04/22/2024 EXAM: CT Left Knee Without IV Contrast 04/21/2024 11:58:28 PM TECHNIQUE: Axial images were acquired through the left knee without IV contrast. Reformatted images were reviewed. Automated exposure control, iterative reconstruction, and/or weight based adjustment of the mA/kV was utilized to reduce the radiation dose to as low as reasonably achievable. COMPARISON: Same day radiographs. CLINICAL HISTORY: Knee trauma, occult fracture suspected, xray done. FINDINGS: BONES: No acute fracture. JOINTS: No dislocation. Total knee arthroplasty. Small knee joint effusion. SOFT TISSUES: The soft tissues are unremarkable. IMPRESSION: 1. No acute osseous abnormality. 2. Small knee joint effusion. Electronically signed by: Norman Gatlin MD 04/22/2024 12:10 AM EDT RP Workstation: [id number]   DG Knee Complete 4 Views Left Result Date: 04/21/2024 EXAM: 4 or more VIEW(S) XRAY OF THE LEFT KNEE 04/21/2024 09:19:39 PM COMPARISON: 01/03/2022 CLINICAL HISTORY: Fall. Pt reports that she was walking backwards out of a bathroom while cleaning her church. She fell straight back and now reports L knee pain. H/x L knee replacement. Home brace present. FINDINGS: BONES AND JOINTS: Left total knee arthroplasty. No radiographic evidence of loosening. No acute fracture or dislocation. Unchanged  small knee joint effusion. SOFT TISSUES: The soft tissues are unremarkable. IMPRESSION: 1. No acute fracture or dislocation. 2. Unchanged small knee joint effusion. Electronically signed by: Norman Gatlin MD 04/21/2024 09:25 PM EDT RP Workstation: [id number]   DG Chest 2 View Result Date: 04/09/2024 CLINICAL DATA:  Chest pain with productive cough. EXAM: CHEST - 2 VIEW COMPARISON:  07/22/2023 and 06/04/2022 radiographs. Cardiac CT 06/02/2023. FINDINGS: The heart size and mediastinal contours are normal. The lungs are clear. There is no pleural effusion or pneumothorax. No acute osseous findings are identified. Mild degenerative changes in the spine. Telemetry leads overlie the chest. IMPRESSION: No evidence of acute cardiopulmonary process. Electronically Signed   By: Elsie Perone M.D.   On: 04/09/2024 12:26   CT Head Wo Contrast Result Date: 04/09/2024 CLINICAL DATA:  73 year old female with new onset headache. Cough. Dizziness. EXAM: CT HEAD WITHOUT CONTRAST TECHNIQUE: Contiguous axial images were obtained from the base of the skull through the vertex without intravenous contrast. RADIATION DOSE REDUCTION: This exam was performed according to the departmental dose-optimization program which includes automated exposure control, adjustment of the mA and/or kV according to patient size and/or  use of iterative reconstruction technique. COMPARISON:  Head CT 07/06/2016. FINDINGS: Brain: Cerebral volume remains normal for age. No midline shift, ventriculomegaly, mass effect, evidence of mass lesion, intracranial hemorrhage or evidence of cortically based acute infarction. Gray-white differentiation stable and within normal limits for age. Vascular: No suspicious intracranial vascular hyperdensity. Skull: Stable and intact.  No acute osseous abnormality identified. Sinuses/Orbits: Visualized paranasal sinuses and mastoids are stable and well aerated. Other: Postoperative changes to both globes since 2017. No  acute orbit or scalp soft tissue finding. IMPRESSION: Normal for age noncontrast Head CT. Electronically Signed   By: VEAR Hurst M.D.   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 She avoids frequent use of hydrocodone  and prefers not to use it for sleep due to its short duration of action. Consider gabapentin for pain management and explore potential use of Jurnavix if insurance allows. Continue current pain management with hydrocodone  and Celebrex . Consider non-pharmacological interventions such as a knee brace during sleep. Immunization due After discussion of medical recommendations/risks/benefits, vaccine was given  Urinary tract infection without hematuria, site unspecified Urinary tract infection due to Proteus species   There is a history of UTI with Proteus species, which can lead to stone formation. No recent antibiotics have been taken for the infection. Order a urine culture to ensure resolution of the previous UTI. 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 Synovial fluid analysis shows inflammation with 59% neutrophils and 5% eosinophils, but no infection. A bone scan indicates increased blood flow and possible immune attack. Differential diagnosis includes loosening, infection, or autoimmune reaction. She is at high risk of complications with knee replacement due to autoimmune conditions. She desires knee replacement to alleviate pain but understands the potential complications and risks. Continue current pain management with hydrocodone  and Celebrex . Consider intraarticular hyaluronic acid injection after  specialist consultation. Regular knee taps will monitor for infection. Consult with an orthopedic specialist for further evaluation and management. Avoid knee replacement unless necessary due to infection or severe dysfunction. NSVT (nonsustained ventricular tachycardia) (HCC) Shared decision-making done; patient understood rationale and agreed to labwork  Thiamine deficiency Thiamine deficiency has not been recently rechecked. Order a blood test to check thiamine levels. Marital estrangement Consult [facility]  Primary hypertension Hypertension is managed with losartan  and metoprolol . A recent increase in losartan  to 100 mg was made due to elevated blood pressure, possibly related to knee pain. Continue losartan  100 mg daily and metoprolol  50 mg daily. Anxiety disorder, unspecified type Anxiety, previously exacerbated by her husband's presence, has improved following his departure. It is managed with buspirone . Continue the current dose of buspirone  and monitor anxiety symptoms, adjusting treatment as needed.  ORDER ASSOCIATIONS  #   DIAGNOSIS / CONDITION ICD-10 ENCOUNTER ORDER     ICD-10-CM   1. Immunization due  Z23 Flu vaccine HIGH DOSE PF(Fluzone Trivalent)    AMB Referral [facility]    2. Urinary tract infection without hematuria, site unspecified  N39.0 Urinalysis w microscopic + reflex cultur    AMB Referral [facility]    3. Arthritis of knee  M17.10 Suzetrigine (JOURNAVX) 50 MG TABS  AMB Referral [facility] Care Management    4. Intractable pain  R52 Suzetrigine (JOURNAVX) 50 MG TABS    AMB Referral [facility] Management    5. NSVT (nonsustained ventricular tachycardia) (HCC)  I47.29 AMB Referral [facility]    B12 and Folate Panel    CBC with Differential/Platelet    Comprehensive metabolic panel with GFR    CANCELED: Comprehensive metabolic panel with GFR    CANCELED: CBC with Differential/Platelet    CANCELED: B12 and Folate Panel    6. Thiamine  deficiency  E51.9 AMB Referral [facility] Care Management    Vitamin B1    CANCELED: Vitamin B1    7. Marital estrangement  Z63.5 AMB Referral [facility]    8. Primary hypertension  I10     9. Anxiety disorder, unspecified type  F41.9     10. Vitamin D  deficiency  E55.9          Orders Placed in Encounter:   Lab Orders         Urinalysis w microscopic + reflex cultur         B12 and Folate Panel         CBC with Differential/Platelet         Comprehensive metabolic panel with GFR         Vitamin B1    Referral Orders         AMB Referral [facility] Care Management    Meds ordered this encounter  Medications   Suzetrigine (JOURNAVX) 50 MG TABS    Sig: Take 1 tablet by mouth 2 (two) times daily as needed.    Dispense:  60 tablet    Refill:  5   Orders Placed This Encounter  Procedures   Flu vaccine HIGH DOSE PF(Fluzone Trivalent)   Urinalysis w microscopic + reflex cultur   B12 and Folate Panel   CBC with Differential/Platelet    Release to patient:   Immediate [1]   Comprehensive metabolic panel with GFR    Has the patient fasted?:   No    Release to patient:   Immediate [1]   Vitamin B1   AMB Referral [facility] Care Management    Referral Priority:   Routine    Referral Type:   Consultation    Referral Reason:   Care Coordination    Number of Visits Requested:   1     This document was synthesized by artificial intelligence (Abridge) using HIPAA-compliant recording of the clinical interaction;   We discussed the use of AI scribe software for clinical note transcription with the patient, who gave verbal consent to proceed. additional Info: This encounter employed state-of-the-art, real-time, collaborative documentation. The patient actively reviewed and assisted in updating their electronic medical record on a shared screen, ensuring transparency and facilitating joint problem-solving for the problem list, overview, and plan. This approach promotes accurate, informed care. The  treatment plan was discussed and reviewed in detail, including medication safety, potential side effects, and all patient questions. We confirmed understanding and comfort with the plan. Follow-up instructions were established, including contacting the office for any concerns, returning if symptoms worsen, persist, or new symptoms develop, and precautions for potential emergency department visits.

## 2024-06-26 NOTE — Assessment & Plan Note (Signed)
 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 Synovial fluid analysis shows inflammation with 59% neutrophils and 5% eosinophils, but no infection. A bone scan indicates increased blood flow and possible immune attack. Differential diagnosis includes loosening, infection, or autoimmune reaction. She is at high risk of complications with knee replacement due to autoimmune conditions. She desires knee replacement to alleviate pain but understands the potential complications and risks. Continue current pain management with hydrocodone  and Celebrex . Consider intraarticular hyaluronic acid injection after specialist consultation. Regular knee taps will monitor for infection. Consult with an orthopedic specialist for further evaluation and management. Avoid knee replacement unless necessary due to infection or severe dysfunction.

## 2024-06-26 NOTE — Assessment & Plan Note (Signed)
 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 She avoids frequent use of hydrocodone  and prefers not to use it for sleep due to its short duration of action. Consider gabapentin for pain management and explore potential use of Jurnavix if insurance allows. Continue current pain management with hydrocodone  and Celebrex . Consider non-pharmacological interventions such as a knee brace during sleep.

## 2024-06-27 ENCOUNTER — Telehealth: Payer: Self-pay | Admitting: *Deleted

## 2024-06-27 LAB — URINALYSIS W MICROSCOPIC + REFLEX CULTURE
Bacteria, UA: NONE SEEN /HPF
Bilirubin Urine: NEGATIVE
Glucose, UA: NEGATIVE
Hgb urine dipstick: NEGATIVE
Hyaline Cast: NONE SEEN /LPF
Ketones, ur: NEGATIVE
Leukocyte Esterase: NEGATIVE
Nitrites, Initial: NEGATIVE
Protein, ur: NEGATIVE
RBC / HPF: NONE SEEN /HPF (ref 0–2)
Specific Gravity, Urine: 1.012 (ref 1.001–1.035)
Squamous Epithelial / HPF: NONE SEEN /HPF (ref <=5)
WBC, UA: NONE SEEN /HPF (ref 0–5)
pH: 6.5 (ref 5.0–8.0)

## 2024-06-27 LAB — NO CULTURE INDICATED

## 2024-06-27 NOTE — Progress Notes (Unsigned)
 Care Guide Pharmacy Note  06/27/2024 Name: [name] MRN: [id number] DOB: [birth year]  Referred By: Jesus Bernardino MATSU, MD Reason for referral: Complex Care Management (Initial outreach to schedule referral with PharmD with [name])   [name] is a 73 y.o. year old female who is a primary care patient of Jesus Bernardino MATSU, MD.  [name] was referred to the pharmacist for assistance related to: HTN and HLD  An unsuccessful telephone outreach was attempted today to contact the patient who was referred to the pharmacy team for assistance with medication assistance. Additional attempts will be made to contact the patient.  [name]  [facility] Health  [facility], [facility] Guide  Direct Dial: [phone]  Fax [phone]

## 2024-06-28 DIAGNOSIS — M25062 Hemarthrosis, left knee: Secondary | ICD-10-CM | POA: Diagnosis not present

## 2024-06-28 NOTE — Progress Notes (Unsigned)
 Care Guide Pharmacy Note  06/28/2024 Name: JENSINE LUZ MRN: [id number] DOB: Oct 02, 1950  Referred By: Jesus Bernardino MATSU, MD Reason for referral: Complex Care Management (Initial outreach to schedule referral with PharmD with [name])   DARSHA ZUMSTEIN is a 73 y.o. year old female who is a primary care patient of Jesus Bernardino MATSU, MD.  Kendrick CHRISTELLA Shutter was referred to the pharmacist for assistance related to: HTN and HLD  A second unsuccessful telephone outreach was attempted today to contact the patient who was referred to the pharmacy team for assistance with medication assistance. Additional attempts will be made to contact the patient.  Harlene Satterfield  [facility]  Value-Based Care Institute, [facility] Guide  Direct Dial: [phone]  Fax [phone]

## 2024-06-28 NOTE — Progress Notes (Signed)
 Faxed over to [employer] [phone]

## 2024-06-29 ENCOUNTER — Ambulatory Visit: Payer: Self-pay | Admitting: Internal Medicine

## 2024-06-29 NOTE — Progress Notes (Signed)
 Care Guide Pharmacy Note  06/29/2024 Name: [name] MRN: [id number] DOB: 12-20-1950  Referred By: Jesus Bernardino MATSU, MD Reason for referral: Complex Care Management (Initial outreach to schedule referral with PharmD with [name])   [name] is a 73 y.o. year old female who is a primary care patient of Jesus Bernardino MATSU, MD.  [name] was referred to the pharmacist for assistance related to: HTN and HLD  A third unsuccessful telephone outreach was attempted today to contact the patient who was referred to the pharmacy team for assistance with medication management. The Population Health team is pleased to engage with this patient at any time in the future upon receipt of referral and should he/she be interested in assistance from the Population Health team.  Harlene Satterfield  [facility] Health  Value-Based Care Institute, [facility] Guide  Direct Dial: [phone]  Fax [phone]

## 2024-06-29 NOTE — Progress Notes (Signed)
 Care Guide Pharmacy Note  06/29/2024 Name: [name] MRN: [id number] DOB: Jan 06, 1951  Referred By: Jesus Bernardino MATSU, MD Reason for referral: Complex Care Management (Initial outreach to schedule referral with PharmD with [name])   [name] is a 73 y.o. year old female who is a primary care patient of Jesus Bernardino MATSU, MD.  [name] was referred to the pharmacist for assistance related to: HTN and HLD  Successful contact was made with the patient to discuss pharmacy services.  Patient declines engagement at this time. Contact information was provided to the patient should they wish to reach out for assistance at a later time.  Harlene Satterfield  [facility] Health  Value-Based Care Institute, [facility] Guide  Direct Dial: [phone]  Fax [phone]

## 2024-07-04 ENCOUNTER — Other Ambulatory Visit: Payer: Self-pay | Admitting: Orthopedic Surgery

## 2024-07-04 DIAGNOSIS — M25062 Hemarthrosis, left knee: Secondary | ICD-10-CM

## 2024-07-05 ENCOUNTER — Telehealth: Payer: Self-pay

## 2024-07-05 NOTE — Telephone Encounter (Signed)
 Spoke with patient and confirmed appointment on 10/17

## 2024-07-06 ENCOUNTER — Inpatient Hospital Stay: Admitting: Hematology and Oncology

## 2024-07-06 ENCOUNTER — Other Ambulatory Visit: Payer: Self-pay | Admitting: *Deleted

## 2024-07-06 ENCOUNTER — Inpatient Hospital Stay: Attending: Hematology and Oncology

## 2024-07-06 ENCOUNTER — Encounter: Payer: Self-pay | Admitting: Hematology and Oncology

## 2024-07-06 VITALS — BP 150/78 | HR 74 | Temp 98.1°F | Ht 67.0 in | Wt 184.9 lb

## 2024-07-06 DIAGNOSIS — Z85038 Personal history of other malignant neoplasm of large intestine: Secondary | ICD-10-CM | POA: Diagnosis not present

## 2024-07-06 DIAGNOSIS — C50411 Malignant neoplasm of upper-outer quadrant of right female breast: Secondary | ICD-10-CM

## 2024-07-06 DIAGNOSIS — Z79899 Other long term (current) drug therapy: Secondary | ICD-10-CM | POA: Diagnosis not present

## 2024-07-06 DIAGNOSIS — Z853 Personal history of malignant neoplasm of breast: Secondary | ICD-10-CM | POA: Insufficient documentation

## 2024-07-06 DIAGNOSIS — M1711 Unilateral primary osteoarthritis, right knee: Secondary | ICD-10-CM | POA: Insufficient documentation

## 2024-07-06 DIAGNOSIS — Z170 Estrogen receptor positive status [ER+]: Secondary | ICD-10-CM | POA: Diagnosis not present

## 2024-07-06 DIAGNOSIS — Z8261 Family history of arthritis: Secondary | ICD-10-CM | POA: Insufficient documentation

## 2024-07-06 DIAGNOSIS — E739 Lactose intolerance, unspecified: Secondary | ICD-10-CM | POA: Insufficient documentation

## 2024-07-06 DIAGNOSIS — Z823 Family history of stroke: Secondary | ICD-10-CM | POA: Insufficient documentation

## 2024-07-06 DIAGNOSIS — Z90711 Acquired absence of uterus with remaining cervical stump: Secondary | ICD-10-CM | POA: Diagnosis not present

## 2024-07-06 DIAGNOSIS — Z860100 Personal history of colon polyps, unspecified: Secondary | ICD-10-CM | POA: Diagnosis not present

## 2024-07-06 DIAGNOSIS — Z923 Personal history of irradiation: Secondary | ICD-10-CM | POA: Diagnosis not present

## 2024-07-06 DIAGNOSIS — Z809 Family history of malignant neoplasm, unspecified: Secondary | ICD-10-CM | POA: Insufficient documentation

## 2024-07-06 DIAGNOSIS — Z91040 Latex allergy status: Secondary | ICD-10-CM | POA: Diagnosis not present

## 2024-07-06 DIAGNOSIS — Z8419 Family history of other disorders of kidney and ureter: Secondary | ICD-10-CM | POA: Insufficient documentation

## 2024-07-06 DIAGNOSIS — Z8719 Personal history of other diseases of the digestive system: Secondary | ICD-10-CM | POA: Insufficient documentation

## 2024-07-06 DIAGNOSIS — Z882 Allergy status to sulfonamides status: Secondary | ICD-10-CM | POA: Diagnosis not present

## 2024-07-06 DIAGNOSIS — Z8711 Personal history of peptic ulcer disease: Secondary | ICD-10-CM | POA: Diagnosis not present

## 2024-07-06 DIAGNOSIS — Z880 Allergy status to penicillin: Secondary | ICD-10-CM | POA: Diagnosis not present

## 2024-07-06 DIAGNOSIS — Z818 Family history of other mental and behavioral disorders: Secondary | ICD-10-CM | POA: Diagnosis not present

## 2024-07-06 DIAGNOSIS — Z87442 Personal history of urinary calculi: Secondary | ICD-10-CM | POA: Insufficient documentation

## 2024-07-06 DIAGNOSIS — Z9049 Acquired absence of other specified parts of digestive tract: Secondary | ICD-10-CM | POA: Diagnosis not present

## 2024-07-06 DIAGNOSIS — M329 Systemic lupus erythematosus, unspecified: Secondary | ICD-10-CM | POA: Diagnosis not present

## 2024-07-06 DIAGNOSIS — I10 Essential (primary) hypertension: Secondary | ICD-10-CM | POA: Insufficient documentation

## 2024-07-06 DIAGNOSIS — Z8249 Family history of ischemic heart disease and other diseases of the circulatory system: Secondary | ICD-10-CM | POA: Insufficient documentation

## 2024-07-06 DIAGNOSIS — Z87440 Personal history of urinary (tract) infections: Secondary | ICD-10-CM | POA: Diagnosis not present

## 2024-07-06 DIAGNOSIS — K5730 Diverticulosis of large intestine without perforation or abscess without bleeding: Secondary | ICD-10-CM | POA: Diagnosis not present

## 2024-07-06 DIAGNOSIS — Z803 Family history of malignant neoplasm of breast: Secondary | ICD-10-CM | POA: Insufficient documentation

## 2024-07-06 LAB — CBC WITH DIFFERENTIAL (CANCER CENTER ONLY)
Abs Immature Granulocytes: 0 K/uL (ref 0.00–0.07)
Basophils Absolute: 0 K/uL (ref 0.0–0.1)
Basophils Relative: 1 %
Eosinophils Absolute: 0.1 K/uL (ref 0.0–0.5)
Eosinophils Relative: 3 %
HCT: 37.1 % (ref 36.0–46.0)
Hemoglobin: 12.4 g/dL (ref 12.0–15.0)
Immature Granulocytes: 0 %
Lymphocytes Relative: 40 %
Lymphs Abs: 1.4 K/uL (ref 0.7–4.0)
MCH: 31 pg (ref 26.0–34.0)
MCHC: 33.4 g/dL (ref 30.0–36.0)
MCV: 92.8 fL (ref 80.0–100.0)
Monocytes Absolute: 0.6 K/uL (ref 0.1–1.0)
Monocytes Relative: 16 %
Neutro Abs: 1.4 K/uL — ABNORMAL LOW (ref 1.7–7.7)
Neutrophils Relative %: 40 %
Platelet Count: 194 K/uL (ref 150–400)
RBC: 4 MIL/uL (ref 3.87–5.11)
RDW: 12.2 % (ref 11.5–15.5)
WBC Count: 3.5 K/uL — ABNORMAL LOW (ref 4.0–10.5)
nRBC: 0 % (ref 0.0–0.2)

## 2024-07-06 LAB — CMP (CANCER CENTER ONLY)
ALT: 11 U/L (ref 0–44)
AST: 20 U/L (ref 15–41)
Albumin: 4.2 g/dL (ref 3.5–5.0)
Alkaline Phosphatase: 94 U/L (ref 38–126)
Anion gap: 5 (ref 5–15)
BUN: 13 mg/dL (ref 8–23)
CO2: 29 mmol/L (ref 22–32)
Calcium: 10.2 mg/dL (ref 8.9–10.3)
Chloride: 105 mmol/L (ref 98–111)
Creatinine: 0.88 mg/dL (ref 0.44–1.00)
GFR, Estimated: >60 mL/min (ref >60)
Glucose, Bld: 76 mg/dL (ref 70–99)
Potassium: 4.2 mmol/L (ref 3.5–5.1)
Sodium: 139 mmol/L (ref 135–145)
Total Bilirubin: 0.5 mg/dL (ref 0.0–1.2)
Total Protein: 7.3 g/dL (ref 6.5–8.1)

## 2024-07-06 LAB — FOLATE: Folate: 15.1 ng/mL (ref >5.9)

## 2024-07-06 LAB — VITAMIN B12: Vitamin B-12: 355 pg/mL (ref 180–914)

## 2024-07-06 NOTE — Progress Notes (Signed)
 Chief Complaint: Patient was seen in consultation today for left knee pain.   Referring Physician(s): Marchwiany,Daniel A  History of Present Illness: [name] is a 73 y.o. female with a medical history significant for right breast cancer, HTN, anxiety/depression, diverticulitis, rheumatoid arthritis and lupus. She also has a history of left knee pain and is s/p left knee arthroplasty in 2006 followed by left and right total knee arthroplasties in 2012. She has had significant left knee pain ever since her surgery.   She was recently seen by her PCP for evaluation of her left knee pain which interferes with her sleep and has also likely contributed to her blood pressure being elevated. She is taking celebrex  several times daily and hydrocodone  as needed. A synovial fluid analysis of the left knee showed inflammation but no infection. A bone scan was also obtained and this indicated increased blood flow and possible autoimmune attack.   Due to her autoimmune conditions she would be at increased risk for complications with another knee surgery. The patient has been referred to Interventional Radiology to explore geniculate artery embolization as a possible treatment option.   [place] Pain Score = 75/96 VAS Pain Score = 10/10   Past Medical History:  Diagnosis Date   Acute pain of right shoulder 11/01/2022   Allergy 1967   Anxiety    Aortic atherosclerosis 11/01/2022   On 2022 CT abdomen.   Arthritis    R knee, hands    Back pain    Blood transfusion    1986- post-partial hysterectomy   Blood transfusion without reported diagnosis    Breast cancer (HCC) 2009   right lumpectomy and radiation   Cataract 12/2023   both eyes   Chicken pox    Colon cancer (HCC) 2003   per pt report, cancer found in a colon polyp in 2003, procedure done in [place].  did not require surgery, chemo etc.     Depression    Difficulty standing 09/08/2022   Due to hip pain mainly- she agreed to  physical therapy if insurance will cover   Diverticular disease 11/01/2022   On 2022 CT abd   Diverticulitis    Edema of both ankles    Elevated amylase 11/22/2022   Amylase                 Component    Value    Date/Time           AMYLASE    57    02/07/2023 1054        Family history of breast cancer 10/24/2017   Flank pain 11/03/2007   Urgent care told UTI responsible but she reports she didn't have one.  History of kidney stones and kind of feels like that  Started 3 weeks ago            Gallbladder problem    Gastric wall thickening 12/15/2022   11/2022 CT abdomen: Focal nodular wall thickening is seen at the GE junction and gastric fundus and gastric carcinoma cannot be excluded. Remainder of the stomach is unremarkable. No evidence of  obstruction, inflammatory process or abnormal fluid collections.  Diverticulosis is seen mainly involving the descending and sigmoid colon, however there is no evidence of diverticulitis.     Had esophagod   Gastritis    Generalized abdominal pain 12/15/2022   GERD (gastroesophageal reflux disease)    recently taken off anti reflux tx   Heart murmur 1954   Hepatitis  Hep. A- 1978   Hiatal hernia    History of stomach ulcers    Hypertension    Insomnia 02/17/2021   Flared with seeing sister summer 2024  Difficulty falling asleep and waking up  She snores but not aware of apnea-never had sleep study  She hasn't tried medication(s)  Getting about 4 hours nightly  No caffeine relationship  Doesn't seem related with arthritis  Is associated with racing thoughts.  Music and bible helps.         Intractable abdominal pain 12/15/2022   01/13/2023 interim history:   carafate , bentyl  (price ineffective), glycopyrrolate  (dry mouth) all stopped  Maalox helps, would like to dc proton pump inhibitor (PPI) stomach acid reducer.      Prior history  IMPRESSION:  Focal nodular wall thickening at the GE junction and gastric fundus.  Gastric carcinoma cannot be excluded.  Consider upper endoscopy or  upper GI series for further evaluation.      Joint pain    Lactose intolerance    Lower extremity edema 01/11/2022   Lupus    Multiple gastric ulcers    Palpitations    Personal history of radiation therapy 2009   Prediabetes 09/08/2022   Lab Results Component Value Date  HGBA1C 5.7 (H) 06/29/2021     Rheumatic fever    Rheumatoid arthritis (HCC)    SLE (systemic lupus erythematosus related syndrome) (HCC)    Upper abdominal pain 11/01/2022   Radiates to the back and lower abdomen.has been present since late 2023 and we do not have a CAT scan yet for it.  X-ray abdomen showed only degenerative disc disease and chronic constipation.  Has visit planned with gastrointestinal specialist 01/04/23     Has been taking bentyl  and hydrocodone  and it helps  Has not been taking prescribed omeprazole  and carafate  due to pharmacist misunderstanding    Urinary incontinence    UTI (urinary tract infection)     Past Surgical History:  Procedure Laterality Date   ABDOMINAL HYSTERECTOMY     1986   APPENDECTOMY     1978 & tubal ligation    BIOPSY  07/25/2018   Procedure: BIOPSY;  Surgeon: Teressa Toribio SQUIBB, MD;  Location: [facility] ENDOSCOPY;  Service: Endoscopy;;   BREAST EXCISIONAL BIOPSY Left 1998   BREAST LUMPECTOMY Bilateral    2009   BREAST SURGERY     R breast lumpectomy   CHOLECYSTECTOMY     1978- open    ESOPHAGOGASTRODUODENOSCOPY (EGD) WITH PROPOFOL  N/A 07/25/2018   Procedure: ESOPHAGOGASTRODUODENOSCOPY (EGD) WITH PROPOFOL ;  Surgeon: Teressa Toribio SQUIBB, MD;  Location[facility] ENDOSCOPY;  Service: Endoscopy;  Laterality: N/A;   JOINT REPLACEMENT  2006, 2013   REPLACEMENT TOTAL KNEE Left    TONSILLECTOMY     1954   TOTAL KNEE ARTHROPLASTY  08/30/2011   Procedure: TOTAL KNEE ARTHROPLASTY;  Surgeon: Dempsey JINNY Sensor;  Location: [facility] OR;  Service: Orthopedics;  Laterality: Right;  Right Total Knee Arthroplasty   TUBAL LIGATION      Allergies: Iodine, Iohexol , Latex,  Penicillins, Lactose, Other, Penicillin g, and Sulfa  antibiotics  Medications: Prior to Admission medications   Medication Sig Start Date End Date Taking? Authorizing Provider  busPIRone  (BUSPAR ) 10 MG tablet Take 2 tablets (20 mg total) by mouth 2 (two) times daily. 05/03/24 08/01/24  Jesus Bernardino MATSU, MD  celecoxib  (CELEBREX ) 50 MG capsule Take 1 capsule (50 mg total) by mouth 2 (two) times daily. 06/08/24   Jesus Bernardino MATSU, MD  famotidine -calcium  carbonate-magnesium  hydroxide (  PEPCID  COMPLETE) 10-800-165 MG chewable tablet CHEW 1 TABLET 2 TIMES DAILYAS NEEDED. 09/29/23   Jesus Bernardino MATSU, MD  HYDROcodone -acetaminophen  (NORCO/VICODIN) 5-325 MG tablet Take 1 tablet by mouth every 6 (six) hours as needed for moderate pain (pain score 4-6) or severe pain (pain score 7-10). 05/08/24   Jesus Bernardino MATSU, MD  hydroxychloroquine (PLAQUENIL) 200 MG tablet Take 200 mg by mouth 2 (two) times daily.    [provider]  losartan -hydrochlorothiazide  (HYZAAR) 100-12.5 MG tablet Take 1 tablet by mouth daily. 06/08/24   Vannie Reche RAMAN, NP  metoprolol  succinate (TOPROL -XL) 50 MG 24 hr tablet Take 1 tablet (50 mg total) by mouth daily. 05/01/24   Jesus Bernardino MATSU, MD  Multiple Vitamins-Minerals (EMERGEN-C IMMUNE PLUS PO) Take 1 tablet by mouth daily.    [provider]  Suzetrigine (JOURNAVX) 50 MG TABS Take 1 tablet by mouth 2 (two) times daily as needed. 06/26/24   Jesus Bernardino MATSU, MD     Family History  Problem Relation Age of Onset   Breast cancer Mother 51   Hypertension Mother    Stroke Mother    Depression Mother    Cancer Mother    Heart attack Father    Other Father    Heart disease Father    Sudden death Father    Hypertension Sister    Hypertension Brother    Kidney disease Brother    Other Maternal Grandmother 102       brain tumor dx at 53- did not bx or do additional testing on tumor   Arthritis Maternal Grandmother    Cancer Paternal Grandmother 78       type unk    Breast cancer Maternal Aunt 51   Cancer Paternal Aunt 17       type unk   Cancer Other    Hypertension Other    Hypertension Sister    Anesthesia problems Neg Hx    Hypotension Neg Hx    Malignant hyperthermia Neg Hx    Pseudochol deficiency Neg Hx    Colon cancer Neg Hx    Esophageal cancer Neg Hx    Stomach cancer Neg Hx    Pancreatic cancer Neg Hx    Liver disease Neg Hx     Social History   Socioeconomic History   Marital status: Married    Spouse name: Not on file   Number of children: 4   Years of education: Not on file   Highest education level: Master's degree (e.g., MA, MS, MEng, MEd, MSW, MBA)  Occupational History   Occupation: Retired Engineer, civil (consulting)  Tobacco Use   Smoking status: Never   Smokeless tobacco: Never  Vaping Use   Vaping status: Never Used  Substance and Sexual Activity   Alcohol  use: No   Drug use: No   Sexual activity: Yes  Other Topics Concern   Not on file  Social History Narrative   ** Merged History Encounter **       Social Drivers of Health   Financial Resource Strain: Low Risk  (03/09/2024)   Overall Financial Resource Strain (CARDIA)    Difficulty of Paying Living Expenses: Not hard at all  Recent Concern: Financial Resource Strain - Medium Risk (02/07/2024)   Overall Financial Resource Strain (CARDIA)    Difficulty of Paying Living Expenses: Somewhat hard  Food Insecurity: No Food Insecurity (03/09/2024)   Hunger Vital Sign    Worried About Running Out of Food in the Last Year: Never true  Ran Out of Food in the Last Year: Never true  Transportation Needs: No Transportation Needs (03/09/2024)   PRAPARE - Administrator, Civil Service (Medical): No    Lack of Transportation (Non-Medical): No  Physical Activity: Insufficiently Active (03/09/2024)   Exercise Vital Sign    Days of Exercise per Week: 4 days    Minutes of Exercise per Session: 30 min  Stress: No Stress Concern Present (03/09/2024)   [employer] of  Occupational Health - Occupational Stress Questionnaire    Feeling of Stress: Not at all  Social Connections: Moderately Integrated (03/09/2024)   Social Connection and Isolation Panel    Frequency of Communication with Friends and Family: More than three times a week    Frequency of Social Gatherings with Friends and Family: Three times a week    Attends Religious Services: More than 4 times per year    Active Member of Clubs or Organizations: Yes    Attends Banker Meetings: 1 to 4 times per year    Marital Status: Separated     Review of Systems: A 12 point ROS discussed and pertinent positives are indicated in the HPI above.  All other systems are negative.    Vital Signs: There were no vitals taken for this visit.  Advance Care Plan: The advanced care plan/surrogate decision maker was discussed at the time of visit and documented in the medical record.    Physical Exam Constitutional:      General: She is not in acute distress. HENT:     Head: Normocephalic.  Eyes:     General: No scleral icterus. Cardiovascular:     Rate and Rhythm: Normal rate and regular rhythm.  Pulmonary:     Effort: No respiratory distress.  Abdominal:     General: There is no distension.  Musculoskeletal:        General: Tenderness present.     Right lower leg: No edema.     Left lower leg: No edema.  Skin:    General: Skin is warm and dry.  Neurological:     Mental Status: She is alert and oriented to person, place, and time.     Imaging:  NM Bone Scan 06/07/24    CT left knee 04/21/24     Left knee X-ray 04/21/24       Labs:  CBC: Recent Labs    07/22/23 1609 01/05/24 1459 04/09/24 1149 05/01/24 1236  WBC 4.4 3.5* 4.7 4.1  HGB 12.4 12.3 11.9* 12.3  HCT 37.0 35.8* 35.6* 36.7  PLT 182 155 185 205.0    COAGS: Recent Labs    07/22/23 1609  INR 1.0  APTT 27    BMP: Recent Labs    07/22/23 1609 01/05/24 1459 04/09/24 1149 05/01/24 1236  NA  138 140 139 140  K 3.5 3.5 3.9 3.7  CL 105 105 103 105  CO2 26 30 24 29   GLUCOSE 93 83 94 86  BUN 13 15 8 11   CALCIUM  10.0 9.6 9.6 9.3  CREATININE 0.66 0.82 0.77 0.67  GFRNONAA >60 >60 >60  --     LIVER FUNCTION TESTS: Recent Labs    07/22/23 1609 01/05/24 1459 04/09/24 1149 05/01/24 1236  BILITOT 0.5 0.5 0.3 0.4  AST 21 20 28 15   ALT 13 13 17 15   ALKPHOS 77 85 143* 88  PROT 7.0 7.2 7.1 6.8  ALBUMIN  3.9 4.1 4.2 3.7    TUMOR MARKERS: No results for input(s):  AFPTM, CEA, CA199, CHROMGRNA in the last 8760 hours.  Assessment and Plan: 73 year old female with a history of bilateral knee pain status post bilateral knee arthroplasties in 2012. She has had persistent, lifestyle limiting pain in her left knee since her surgery. She also has a history of lupus and rheumatoid arthritis. Recent bone scan demonstrates significant synovial hyperemia about the left knee, concordant with her pain.  I believe she would be an excellent candidate for geniculate artery embolization given synovial hyperemia, and hopeful that she would have significant pain relief after embolization.  We discussed the rationale, periprocedural expectations, and long term expected outcomes after geniculate artery embolization.  She is amenable and ready to proceed.  Plan for left geniculate artery embolization with moderate sedation at [facility] via antegrade femoral artery approach.   Ester Sides, MD Pager: [phone]    I spent a total of  40 Minutes   in face to face in clinical consultation, greater than 50% of which was counseling/coordinating care for left knee pain.

## 2024-07-06 NOTE — Progress Notes (Signed)
 [facility]  [phone] [phone]     ID: [name] DOB: 1951-03-09  MR#: [id number]  RDW#:[id number]  Patient Care Team: Jesus Bernardino MATSU, MD as PCP - General (Internal Medicine) Raford Riggs, MD as PCP - Cardiology (Cardiology) Ishmael Slough, MD as Consulting Physician (Rheumatology) Kerman Vina CHRISTELLA, NP as Nurse Practitioner (Gastroenterology) Murrell Drivers, MD as Consulting Physician (Orthopedic Surgery) Loretha Ash, MD as Consulting Physician (Hematology and Oncology) Aneita Gwendlyn DASEN, MD (Inactive) as Consulting Physician (Gastroenterology)  CHIEF COMPLAINT: Estrogen receptor positive breast cancer  CURRENT TREATMENT: tamoxifen    INTERVAL HISTORY:   Discussed the use of AI scribe software for clinical note transcription with the patient, who gave verbal consent to proceed.  History of Present Illness  [name] is a 73 year old female with history of breast cancer and systemic lupus erythematosus who presents with knee swelling and pain.  She experiences significant knee pain and swelling following a twisting injury in the kitchen. The pain is severe, and she has been using a brace for support. Fluid aspiration from the knee revealed blood without infection, yet the swelling persists intermittently, necessitating repeated aspirations.  Various diagnostic tests, including scans and bone density tests, have not identified specific issues. She is under the care of a rheumatologist for her systemic lupus erythematosus.  The knee swelling causes significant discomfort and occasionally requires the use of a walker.   NO other symptoms. No change in breathing, bowel habits or urinary habits No new neurologic complaints Rest of the pertinent 10 point ROS reviewed and neg.  COVID 19 VACCINATION STATUS: Pfizer x3, most recently 07/2020; infection 03/25/2021  BREAST CANCER HISTORY: From Dr. Sharma Canes earlier  summary:  #1 patient is status post lumpectomy with sentinel lymph node biopsy followed by radiation to the right breast. In December 2009 patient began aromatase inhibitor but could not tolerate it.  #2 therefore she began in March 2010 tamoxifen  20 mg daily. However she has experienced significant amount of aches and pains and in December 2012 discontinued it. At this time she does not want to go back on any kind of antiestrogen therapy due to side effects.  #3 patient would like to go back on tamoxifen  20 mg daily. She recently attended finding your new normal seminar. And because of that she wants to she would like to go back on the tamoxifen . We discussed side effects again. She was sent to her pharmacy.  Her subsequent history is as detailed below   PAST MEDICAL HISTORY: Past Medical History:  Diagnosis Date   Acute pain of right shoulder 11/01/2022   Allergy 1967   Anxiety    Aortic atherosclerosis 11/01/2022   On 2022 CT abdomen.   Arthritis    R knee, hands    Back pain    Blood transfusion    1986- post-partial hysterectomy   Blood transfusion without reported diagnosis    Breast cancer (HCC) 2009   right lumpectomy and radiation   Cataract 12/2023   both eyes   Chicken pox    Colon cancer (HCC) 2003   per pt report, cancer found in a colon polyp in 2003, procedure done in [place].  did not require surgery, chemo etc.     Depression    Difficulty standing 09/08/2022   Due to hip pain mainly- she agreed to physical therapy if insurance will cover   Diverticular disease 11/01/2022   On 2022 CT abd   Diverticulitis  Edema of both ankles    Elevated amylase 11/22/2022   Amylase                 Component    Value    Date/Time           AMYLASE    57    02/07/2023 1054        Family history of breast cancer 10/24/2017   Flank pain 11/03/2007   Urgent care told UTI responsible but she reports she didn't have one.  History of kidney stones and kind of feels like that   Started 3 weeks ago            Gallbladder problem    Gastric wall thickening 12/15/2022   11/2022 CT abdomen: Focal nodular wall thickening is seen at the GE junction and gastric fundus and gastric carcinoma cannot be excluded. Remainder of the stomach is unremarkable. No evidence of  obstruction, inflammatory process or abnormal fluid collections.  Diverticulosis is seen mainly involving the descending and sigmoid colon, however there is no evidence of diverticulitis.     Had esophagod   Gastritis    Generalized abdominal pain 12/15/2022   GERD (gastroesophageal reflux disease)    recently taken off anti reflux tx   Heart murmur 1954   Hepatitis    Hep. A- 1978   Hiatal hernia    History of stomach ulcers    Hypertension    Insomnia 02/17/2021   Flared with seeing sister summer 2024  Difficulty falling asleep and waking up  She snores but not aware of apnea-never had sleep study  She hasn't tried medication(s)  Getting about 4 hours nightly  No caffeine relationship  Doesn't seem related with arthritis  Is associated with racing thoughts.  Music and bible helps.         Intractable abdominal pain 12/15/2022   01/13/2023 interim history:   carafate , bentyl  (price ineffective), glycopyrrolate  (dry mouth) all stopped  Maalox helps, would like to dc proton pump inhibitor (PPI) stomach acid reducer.      Prior history  IMPRESSION:  Focal nodular wall thickening at the GE junction and gastric fundus.  Gastric carcinoma cannot be excluded. Consider upper endoscopy or  upper GI series for further evaluation.      Joint pain    Lactose intolerance    Lower extremity edema 01/11/2022   Lupus    Multiple gastric ulcers    Palpitations    Personal history of radiation therapy 2009   Prediabetes 09/08/2022   Lab Results Component Value Date  HGBA1C 5.7 (H) 06/29/2021     Rheumatic fever    Rheumatoid arthritis (HCC)    SLE (systemic lupus erythematosus related syndrome) (HCC)    Upper abdominal  pain 11/01/2022   Radiates to the back and lower abdomen.has been present since late 2023 and we do not have a CAT scan yet for it.  X-ray abdomen showed only degenerative disc disease and chronic constipation.  Has visit planned with gastrointestinal specialist 01/04/23     Has been taking bentyl  and hydrocodone  and it helps  Has not been taking prescribed omeprazole  and carafate  due to pharmacist misunderstanding    Urinary incontinence    UTI (urinary tract infection)     PAST SURGICAL HISTORY: Past Surgical History:  Procedure Laterality Date   ABDOMINAL HYSTERECTOMY     1986   APPENDECTOMY     1978 & tubal ligation    BIOPSY  07/25/2018  Procedure: BIOPSY;  Surgeon: Teressa Toribio SQUIBB, MD;  Location: [facility] ENDOSCOPY;  Service: Endoscopy;;   BREAST EXCISIONAL BIOPSY Left 1998   BREAST LUMPECTOMY Bilateral    2009   BREAST SURGERY     R breast lumpectomy   CHOLECYSTECTOMY     1978- open    ESOPHAGOGASTRODUODENOSCOPY (EGD) WITH PROPOFOL  N/A 07/25/2018   Procedure: ESOPHAGOGASTRODUODENOSCOPY (EGD) WITH PROPOFOL ;  Surgeon: Teressa Toribio SQUIBB, MD;  Location: [facility] ENDOSCOPY;  Service: Endoscopy;  Laterality: N/A;   JOINT REPLACEMENT  2006, 2013   REPLACEMENT TOTAL KNEE Left    TONSILLECTOMY     1954   TOTAL KNEE ARTHROPLASTY  08/30/2011   Procedure: TOTAL KNEE ARTHROPLASTY;  Surgeon: Dempsey JINNY Sensor;  Location: [facility] OR;  Service: Orthopedics;  Laterality: Right;  Right Total Knee Arthroplasty   TUBAL LIGATION      FAMILY HISTORY Family History  Problem Relation Age of Onset   Breast cancer Mother 45   Hypertension Mother    Stroke Mother    Depression Mother    Cancer Mother    Heart attack Father    Other Father    Heart disease Father    Sudden death Father    Hypertension Sister    Hypertension Brother    Kidney disease Brother    Other Maternal Grandmother 102       brain tumor dx at 76- did not bx or do additional testing on tumor   Arthritis Maternal Grandmother    Cancer  Paternal Grandmother 25       type unk   Breast cancer Maternal Aunt 32   Cancer Paternal Aunt 25       type unk   Cancer Other    Hypertension Other    Hypertension Sister    Anesthesia problems Neg Hx    Hypotension Neg Hx    Malignant hyperthermia Neg Hx    Pseudochol deficiency Neg Hx    Colon cancer Neg Hx    Esophageal cancer Neg Hx    Stomach cancer Neg Hx    Pancreatic cancer Neg Hx    Liver disease Neg Hx    the patient's father died from a heart attack at the age of 52. The patient's mother died at the age of 46. She had a history of breast cancer and one of her sisters was diagnosed with breast cancer at the age of 29. The patient had 5 brothers and 5 sisters. There is no other history of breast or ovarian cancer in the family to her knowledge.   GYNECOLOGIC HISTORY:  No LMP recorded. Patient has had a hysterectomy. Menarche age 11, first live birth age 44. The patient is GX P3. She had a simple hysterectomy in 1986 (no salpingo-oophorectomy).   SOCIAL HISTORY: (Updated February 2022). 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  She remarried in 2011. Her husband Laurier is retired from [employer]. The patient has one adopted son in addition to her own children. 3 of her children live in town 1 in [place] . She has 7 grandchildren. She attends [employer] locally.    ADVANCED DIRECTIVES: Not in place   HEALTH MAINTENANCE: Social History   Tobacco Use   Smoking status: Never   Smokeless tobacco: Never  Vaping Use   Vaping status: Never Used  Substance  Use Topics   Alcohol  use: No   Drug use: No     Colonoscopy:  PAP:  Bone density: 04/30/2008 was normal  Lipid panel:  Allergies  Allergen Reactions   Iodine Anaphylaxis and Hives   Iohexol  Anaphylaxis     Code: HIVES, Desc: PT IS ALLERGIC TO  IODINATED CONTRAST; REACTION INCLUDES FACIAL SWELLING,HIVES,RESPIRATORY DISTRESS;PT HASN'T HAD SCAN W/PREMEDS.;REFUSED CONTRAST OF ANY KIND!  KR    Latex Anaphylaxis   Penicillins Anaphylaxis and Rash    Has patient had a PCN reaction causing immediate rash, facial/tongue/throat swelling, SOB or lightheadedness with hypotension: Yes Has patient had a PCN reaction causing severe rash involving mucus membranes or skin necrosis: Yes Has patient had a PCN reaction that required hospitalization: No Has patient had a PCN reaction occurring within the last 10 years: No If all of the above answers are NO, then may proceed with Cephalosporin use.    Lactose Other (See Comments)    Lactose intolerant-cramping  Lactose intolerant   Other Other (See Comments)   Penicillin G Other (See Comments)   Sulfa  Antibiotics Hives    Current Outpatient Medications  Medication Sig Dispense Refill   busPIRone  (BUSPAR ) 10 MG tablet Take 2 tablets (20 mg total) by mouth 2 (two) times daily. 360 tablet 0   celecoxib  (CELEBREX ) 50 MG capsule Take 1 capsule (50 mg total) by mouth 2 (two) times daily. 60 capsule 5   famotidine -calcium  carbonate-magnesium  hydroxide (PEPCID  COMPLETE) 10-800-165 MG chewable tablet CHEW 1 TABLET 2 TIMES DAILYAS NEEDED. 100 tablet 11   HYDROcodone -acetaminophen  (NORCO/VICODIN) 5-325 MG tablet Take 1 tablet by mouth every 6 (six) hours as needed for moderate pain (pain score 4-6) or severe pain (pain score 7-10). 20 tablet 0   hydroxychloroquine (PLAQUENIL) 200 MG tablet Take 200 mg by mouth 2 (two) times daily.     losartan -hydrochlorothiazide  (HYZAAR) 100-12.5 MG tablet Take 1 tablet by mouth daily. 90 tablet 3   metoprolol  succinate (TOPROL -XL) 50 MG 24 hr tablet Take 1 tablet (50 mg total) by mouth daily. 90 tablet 1   Multiple Vitamins-Minerals (EMERGEN-C IMMUNE PLUS PO) Take 1 tablet by mouth daily.     No current facility-administered medications for this visit.    OBJECTIVE:  African-American woman in no acute distress  Vitals:   07/06/24 1247  BP: (!) 150/78  Pulse: 74  Temp: 98.1 F (36.7 C)  SpO2: 100%        Body mass index is 28.96 kg/m.    ECOG FS:1 - Symptomatic but completely ambulatory   Physical Exam Constitutional:      Appearance: Normal appearance.  Cardiovascular:     Rate and Rhythm: Normal rate and regular rhythm.  Chest:     Comments: Bilateral breast inspected and palpated.  No palpable masses or regional adenopathy. Musculoskeletal:     Cervical back: Normal range of motion and neck supple. No rigidity.  Lymphadenopathy:     Cervical: No cervical adenopathy.  Neurological:     Mental Status: She is alert.     LAB RESULTS:  CMP     Component Value Date/Time   NA 139 07/06/2024 1228   NA 141 07/14/2022 1338   NA 141 01/26/2017 1508   K 4.2 07/06/2024 1228   K 3.4 (L) 01/26/2017 1508   CL 105 07/06/2024 1228   CL 102 01/25/2013 0909   CO2 29 07/06/2024 1228   CO2 29 01/26/2017 1508   GLUCOSE 76 07/06/2024 1228   GLUCOSE 91 01/26/2017 1508  GLUCOSE 81 01/25/2013 0909   BUN 13 07/06/2024 1228   BUN 19 07/14/2022 1338   BUN 11.5 01/26/2017 1508   CREATININE 0.88 07/06/2024 1228   CREATININE 0.8 01/26/2017 1508   CALCIUM  10.2 07/06/2024 1228   CALCIUM  9.5 01/26/2017 1508   PROT 7.3 07/06/2024 1228   PROT 7.1 01/26/2017 1508   ALBUMIN  4.2 07/06/2024 1228   ALBUMIN  3.5 01/26/2017 1508   AST 20 07/06/2024 1228   AST 22 01/26/2017 1508   ALT 11 07/06/2024 1228   ALT 17 01/26/2017 1508   ALKPHOS 94 07/06/2024 1228   ALKPHOS 72 01/26/2017 1508   BILITOT 0.5 07/06/2024 1228   BILITOT 0.38 01/26/2017 1508   GFRNONAA >60 07/06/2024 1228   GFRAA 91 09/04/2020 1037   GFRAA >60 02/15/2019 1436    INo results found for: SPEP, UPEP  Lab Results  Component Value Date   WBC 3.5 (L) 07/06/2024   NEUTROABS 1.4 (L) 07/06/2024   HGB 12.4 07/06/2024   HCT 37.1 07/06/2024   MCV 92.8 07/06/2024   PLT 194  07/06/2024      Chemistry      Component Value Date/Time   NA 139 07/06/2024 1228   NA 141 07/14/2022 1338   NA 141 01/26/2017 1508   K 4.2 07/06/2024 1228   K 3.4 (L) 01/26/2017 1508   CL 105 07/06/2024 1228   CL 102 01/25/2013 0909   CO2 29 07/06/2024 1228   CO2 29 01/26/2017 1508   BUN 13 07/06/2024 1228   BUN 19 07/14/2022 1338   BUN 11.5 01/26/2017 1508   CREATININE 0.88 07/06/2024 1228   CREATININE 0.8 01/26/2017 1508      Component Value Date/Time   CALCIUM  10.2 07/06/2024 1228   CALCIUM  9.5 01/26/2017 1508   ALKPHOS 94 07/06/2024 1228   ALKPHOS 72 01/26/2017 1508   AST 20 07/06/2024 1228   AST 22 01/26/2017 1508   ALT 11 07/06/2024 1228   ALT 17 01/26/2017 1508   BILITOT 0.5 07/06/2024 1228   BILITOT 0.38 01/26/2017 1508       Lab Results  Component Value Date   LABCA2 16 11/09/2010    No components found for: [id number]  No results for input(s): INR in the last 168 hours.  Urinalysis    Component Value Date/Time   COLORURINE YELLOW 06/26/2024 1224   APPEARANCEUR CLEAR 06/26/2024 1224   LABSPEC 1.012 06/26/2024 1224   LABSPEC 1.015 01/26/2017 1523   PHURINE 6.5 06/26/2024 1224   GLUCOSEU NEGATIVE 06/26/2024 1224   GLUCOSEU NEGATIVE 09/08/2022 1237   GLUCOSEU Negative 01/26/2017 1523   HGBUR NEGATIVE 06/26/2024 1224   HGBUR negative 01/22/2010 1551   BILIRUBINUR NEG 02/07/2024 1513   BILIRUBINUR Negative 01/26/2017 1523   KETONESUR NEGATIVE 06/26/2024 1224   PROTEINUR NEGATIVE 06/26/2024 1224   UROBILINOGEN 0.2 02/07/2024 1513   UROBILINOGEN 1.0 09/08/2022 1237   UROBILINOGEN 0.2 01/26/2017 1523   NITRITE NEG 02/07/2024 1513   NITRITE NEGATIVE 01/05/2024 1446   LEUKOCYTESUR Negative 02/07/2024 1513   LEUKOCYTESUR NEGATIVE 01/05/2024 1446   LEUKOCYTESUR Small 01/26/2017 1523    STUDIES: NM Bone Scan 3 Phase Result Date: 06/11/2024 CLINICAL DATA:  LEFT knee pain.  Knee arthroplasty. EXAM: NUCLEAR MEDICINE 3-PHASE BONE SCAN TECHNIQUE:  Radionuclide angiographic images, immediate static blood pool images, and 3-hour delayed static images were obtained of the after intravenous injection of radiopharmaceutical. RADIOPHARMACEUTICALS:  18.5 mCi Tc-72m MDP IV COMPARISON:  Radiograph and CT 04/21/2024 FINDINGS: Vascular phase: Mild increased blood flow to  the LEFT knee. Blood pool phase: Mild asymmetric increased blood pool activity in the LEFT knee loca primarily above the joint Delayed phase: Asymmetric increased uptake within the periprosthetic bone of the LEFT knee involving the femoral condyles as well as the tibial plateau. IMPRESSION: Increased activity in the vascular phase and delayed phase associated with the LEFT knee prosthetic. Findings are concerning for loosening or infection of the LEFT knee prosthetic. Electronically Signed   By: Jackquline Boxer M.D.   On: 06/11/2024 14:39      ASSESSMENT: 73 y.o. 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  (2) status post right lumpectomy 03/20/2008 for invasive lobular carcinoma, multifocal, pT1a pNX, stage IA, grade 2, strongly estrogen and progesterone receptor positive, HER-2 negative. with negative margins.  (3) status post right axillary sentinel lymph node sampling 04/17/2008, the single sentinel lymph node being clear  (4) Oncotype score of 10 predicts an outside the breast risk of recurrence of 7% if the patient's only systemic therapy is tamoxifen  for 5 years.  (5) Additional right breast biopsy 05/23/2008 to evaluate microcalcifications seen on pre-radiotherapy mammography showed only atypical lobular hyperplasia  (6) Completed radiation to the right breast 07/26/2008 (50 gray +14 gray boost).  (7) Started on anastrozole November 2009, with poor tolerance  (8). Started on tamoxifen  March 2010, continued to  December 2012, resumed December 2014   (a) tamoxifen  discontinued February 2022  (9) status post simple hysterectomy 1986 (no salpingo-oophorectomy)  (10) genetics testing 10/31/2017 through the Common Hereditary Cancers Panel offered by [facility] found no deleterious mutations in APC, ATM, AXIN2, BARD1, BMPR1A, BRCA1, BRCA2, BRIP1, CDH1, CDKN2A (p14ARF), CDKN2A (p16INK4a), CKD4, CHEK2, CTNNA1, DICER1, EPCAM (Deletion/duplication testing only), GREM1 (promoter region deletion/duplication testing only), KIT, MEN1, MLH1, MSH2, MSH3, MSH6, MUTYH, NBN, NF1, NHTL1, PALB2, PDGFRA, PMS2, POLD1, POLE, PTEN, RAD50, RAD51C, RAD51D, SDHB, SDHC, SDHD, SMAD4, SMARCA4. STK11, TP53, TSC1, TSC2, and VHL.  The following genes were evaluated for sequence changes only: SDHA and HOXB13 c.251G>A variant only.  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    PLAN:  Assessment and Plan Assessment & Plan Knee pain and swelling status post knee replacement, likely lupus-related Intermittent knee pain and swelling post-twisting injury. Blood in aspirated fluid, no infection. Imaging inconclusive. Orthopedic and rheumatology evaluations suggest lupus-related inflammation. Prosthetic knee intact. - Consult DRI specialist for lupus-related joint inflammation and potential shunt placement. - Complete specialist-recommended tests for shunt placement qualification.  Systemic lupus erythematosus (SLE) SLE may contribute to knee inflammation. Rheumatologist acknowledges lupus attacking joint. Prosthetic knee unchanged. Further specialist evaluation planned.  History of breast cancer No concern for recurrence based on ROS or PE Mammogram scheduled for November   FU in 6 months, pt's preference.  Total time spent: 30 minutes,   *Total Encounter Time as defined by the Centers for Medicare and Medicaid Services includes, in addition to the face-to-face time of a patient visit  (documented in the note above) non-face-to-face time: obtaining and reviewing outside history, ordering and reviewing medications, tests or procedures, care coordination (communications with other health care professionals or caregivers) and documentation in the medical record.

## 2024-07-09 ENCOUNTER — Ambulatory Visit
Admission: RE | Admit: 2024-07-09 | Discharge: 2024-07-09 | Disposition: A | Discharge status: Home / Self Care | Source: Ambulatory Visit | Attending: Orthopedic Surgery | Admitting: Orthopedic Surgery

## 2024-07-09 DIAGNOSIS — M25062 Hemarthrosis, left knee: Secondary | ICD-10-CM | POA: Diagnosis not present

## 2024-07-09 HISTORY — PX: IR RADIOLOGIST EVAL & MGMT: IMG5224

## 2024-07-11 ENCOUNTER — Ambulatory Visit (HOSPITAL_BASED_OUTPATIENT_CLINIC_OR_DEPARTMENT_OTHER): Payer: Self-pay | Admitting: Family

## 2024-07-11 ENCOUNTER — Ambulatory Visit (INDEPENDENT_AMBULATORY_CARE_PROVIDER_SITE_OTHER)

## 2024-07-11 DIAGNOSIS — R0602 Shortness of breath: Secondary | ICD-10-CM

## 2024-07-11 LAB — ECHOCARDIOGRAM COMPLETE
AV Vena cont: 0.28 cm
Area-P 1/2: 3.08 cm2
P 1/2 time: 782 ms
S' Lateral: 3.02 cm

## 2024-07-16 DIAGNOSIS — M25562 Pain in left knee: Secondary | ICD-10-CM | POA: Diagnosis not present

## 2024-07-16 DIAGNOSIS — Z96652 Presence of left artificial knee joint: Secondary | ICD-10-CM | POA: Diagnosis not present

## 2024-07-17 DIAGNOSIS — M25562 Pain in left knee: Secondary | ICD-10-CM | POA: Diagnosis not present

## 2024-07-17 DIAGNOSIS — T8484XA Pain due to internal orthopedic prosthetic devices, implants and grafts, initial encounter: Secondary | ICD-10-CM | POA: Diagnosis not present

## 2024-07-17 DIAGNOSIS — Z96652 Presence of left artificial knee joint: Secondary | ICD-10-CM | POA: Diagnosis not present

## 2024-07-18 DIAGNOSIS — Z6828 Body mass index (BMI) 28.0-28.9, adult: Secondary | ICD-10-CM | POA: Diagnosis not present

## 2024-07-18 DIAGNOSIS — E663 Overweight: Secondary | ICD-10-CM | POA: Diagnosis not present

## 2024-07-18 DIAGNOSIS — M25562 Pain in left knee: Secondary | ICD-10-CM | POA: Diagnosis not present

## 2024-07-18 DIAGNOSIS — Z79899 Other long term (current) drug therapy: Secondary | ICD-10-CM | POA: Diagnosis not present

## 2024-07-18 DIAGNOSIS — M1991 Primary osteoarthritis, unspecified site: Secondary | ICD-10-CM | POA: Diagnosis not present

## 2024-07-18 DIAGNOSIS — M329 Systemic lupus erythematosus, unspecified: Secondary | ICD-10-CM | POA: Diagnosis not present

## 2024-07-31 DIAGNOSIS — M329 Systemic lupus erythematosus, unspecified: Secondary | ICD-10-CM | POA: Diagnosis not present

## 2024-08-02 ENCOUNTER — Other Ambulatory Visit: Payer: Self-pay | Admitting: Interventional Radiology

## 2024-08-02 DIAGNOSIS — M1712 Unilateral primary osteoarthritis, left knee: Secondary | ICD-10-CM

## 2024-08-06 ENCOUNTER — Ambulatory Visit
Admission: RE | Admit: 2024-08-06 | Discharge: 2024-08-06 | Disposition: A | Discharge status: Home / Self Care | Source: Ambulatory Visit | Attending: Hematology and Oncology | Admitting: Hematology and Oncology

## 2024-08-06 DIAGNOSIS — Z1231 Encounter for screening mammogram for malignant neoplasm of breast: Secondary | ICD-10-CM | POA: Diagnosis not present

## 2024-08-06 DIAGNOSIS — C50411 Malignant neoplasm of upper-outer quadrant of right female breast: Secondary | ICD-10-CM

## 2024-08-10 ENCOUNTER — Telehealth: Admitting: Internal Medicine

## 2024-08-10 DIAGNOSIS — M1710 Unilateral primary osteoarthritis, unspecified knee: Secondary | ICD-10-CM | POA: Diagnosis not present

## 2024-08-10 DIAGNOSIS — R52 Pain, unspecified: Secondary | ICD-10-CM | POA: Diagnosis not present

## 2024-08-10 MED ORDER — HYDROCODONE-ACETAMINOPHEN 10-325 MG PO TABS: 1.0000 | ORAL_TABLET | Freq: Three times a day (TID) | ORAL | 0 refills | Status: DC | PRN

## 2024-08-10 MED ORDER — JOURNAVX 50 MG PO TABS
1.0000 | ORAL_TABLET | Freq: Two times a day (BID) | ORAL | 5 refills | Status: AC | PRN
Start: 2024-08-10 — End: ?

## 2024-08-10 NOTE — Progress Notes (Unsigned)
 ====================================  [facility]: [phone]   --  Virtual Video Medical Office Visit --  Patient: [name]      Age: 73 y.o.       Sex:  female  Date:   08/10/2024 Today's Healthcare Provider: Bernardino KANDICE Cone, MD  ====================================    Chief Complaint/Reason For Visit: Medication Refill (Requesting pain meds for knee )   { Pre-visit Review: No specialty comments available. Problem List as of 08/10/2024 Reviewed: 06/26/2024  8:12 PM by Cone Bernardino KANDICE, MD    Abnormal finding on imaging of liver   Last Assessment & Plan 10/31/2023 Office Visit Written 10/31/2023  7:16 PM by Cone Bernardino KANDICE, MD  Updated problem overview for this problem to improve longitudinal management. No plans for follow up at this time but MRI might be considered to further characterize in future.      Adjustment disorder   Last Assessment & Plan 06/07/2023 Office Visit Written 06/07/2023  7:31 PM by Cone Bernardino KANDICE, MD  Her anxiety is not well-controlled with the current Buspirone  regimen, likely due to stress related to her sister's dementia. We will increase the Buspirone  dosage and monitor for improvement in anxiety symptoms.      Aortic atherosclerosis   Last Assessment & Plan 10/31/2023 Office Visit Written 10/31/2023  7:16 PM by Cone Bernardino KANDICE, MD  Atherosclerosis Mild atherosclerosis noted. Discuss potential benefits and risks of starting aspirin  and cholesterol medication with a cardiologist. Patient prefers to avoid anti-cholesterol medication due to side effects. Consider ultrasound of the neck to assess for atherosclerosis.      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 Synovial fluid analysis shows inflammation with 59% neutrophils and 5% eosinophils, but no infection. A bone scan indicates increased blood flow and possible immune attack. Differential diagnosis includes loosening, infection, or autoimmune reaction. She is at high risk of complications with knee replacement due to autoimmune conditions. She desires knee replacement to alleviate pain but understands the potential complications and risks. Continue current pain management with hydrocodone  and Celebrex . Consider intraarticular hyaluronic acid injection after specialist consultation. Regular knee taps will monitor for infection. Consult with an orthopedic specialist for further evaluation and management. Avoid knee replacement unless necessary due to infection or severe dysfunction.      Chronic idiopathic constipation   Chronic kidney disease, stage 2 (mild)   Last Assessment & Plan 05/01/2024 Office Visit Written 05/01/2024  8:31 PM by Cone Bernardino KANDICE, MD  Will order lab testing to guide management. Share with rheumatology      Chronic pain of left knee   Last Assessment & Plan 06/07/2023 Office Visit Written 06/07/2023  7:31 PM by Cone Bernardino KANDICE, MD  She will discuss with her orthopedist      DDD (degenerative disc disease), thoracic   Diastolic dysfunction   Last Assessment & Plan 06/07/2023 Office Visit Written 06/07/2023  7:31 PM by Cone Bernardino KANDICE, MD  She shows no symptoms of fluid overload, and Hydrochlorothiazide  is effectively controlling any potential fluid accumulation. We will continue Hydrochlorothiazide  as currently prescribed.      Difficulty standing   Last Assessment & Plan 10/31/2023 Office Visit  Written 10/31/2023  7:16 PM by Jesus Bernardino MATSU, MD  Osteoarthritis and Rheumatoid Arthritis Left knee and thoracic spinal pain cause difficulty standing without using hands, likely due to knee and hip  pain. Completed physical therapy but still requires hands to stand, which may strain wrists. Encourage techniques to reduce wrist strain when standing. Continue monitoring and managing pain as needed.      Diverticular disease   Last Assessment & Plan 09/08/2022 Office Visit Written 10/31/2023 11:48 AM by Jesus Bernardino MATSU, MD   >>ASSESSMENT AND PLAN FOR DIVERTICULOSIS, COLON WRITTEN ON 09/08/2022 10:41 AM BY Zalea Pete G, MD  Encouraged her to start using her fiber gummies      Family history of coronary artery disease in father   Last Assessment & Plan 09/04/2020 Office Visit Written 09/04/2020 10:18 AM by Maxie Herlene POUR, PA-C  Father had an MI in his 78's      Frequent urination   Last Assessment & Plan 05/01/2024 Office Visit Written 05/01/2024  8:31 PM by Jesus Bernardino MATSU, MD  Will order lab testing to guide management. Share with rheumatology      GERD (gastroesophageal reflux disease)   Last Assessment & Plan 09/28/2023 Office Visit Written 09/28/2023  7:07 PM by Jesus Bernardino MATSU, MD  Gastroesophageal Reflux Disease (GERD)   Her chronic GERD is managed with Prilosec. We will continue Prilosec.      Hand arthritis   Last Assessment & Plan 09/28/2023 Office Visit Written 09/28/2023  7:07 PM by Jesus Bernardino MATSU, MD  Chronic hand osteoarthritis presents with worsening pain, rated at 9/10 in severity, exacerbated by cold weather. Previous treatments include Voltaren and Tylenol , with orthopedic and rheumatology consultations noting spur formation. Despite the risk of stomach bleeding associated with Celebrex , she prefers it, acknowledging the need for Pepcid  Complete for stomach protection. We will order bilateral hand x-rays, prescribe Celebrex  50 mg alongside Pepcid  Complete, and continue Voltaren gel.       Hiatal hernia   Last Assessment & Plan 10/31/2023 Office Visit Written 10/31/2023  7:16 PM by Jesus Bernardino MATSU, MD  Discussed importance of informing us  if gastritis symptom(s)  return- may need to consider surgical repair. Discussed valsalva risks.      History of breast cancer   Last Assessment & Plan 09/04/2020 Office Visit Written 09/04/2020 10:18 AM by Maxie Herlene POUR, PA-C  Biopsy and Tamoxifen  1998, recurrent in Rt breast 2009- treated with Tamoxifen  and radiation. Followed by Dr Layla      HTN (hypertension)   Last Assessment & Plan 06/26/2024 Office Visit Written 06/26/2024 12:53 PM by Jesus Bernardino MATSU, MD  Hypertension is managed with losartan  and metoprolol . A recent increase in losartan  to 100 mg was made due to elevated blood pressure, possibly related to knee pain. Continue losartan  100 mg daily and metoprolol  50 mg daily.      Hyperlipidemia with target LDL less than 130   Last Assessment & Plan 10/31/2023 Office Visit Written 10/31/2023  7:16 PM by Jesus Bernardino MATSU, MD  Atherosclerosis Mild atherosclerosis noted. Discuss potential benefits and risks of starting aspirin  and cholesterol medication with a cardiologist. Patient prefers to avoid anti-cholesterol medication due to side effects. Consider ultrasound of the neck to assess for atherosclerosis.      Hypokalemia   Last Assessment & Plan 03/09/2024 Office Visit Written 03/11/2024  8:21 AM by Jesus Bernardino MATSU, MD  Complexity: Moderate. This is a medication-induced problem complicating the management of another chronic condition (HTN). Assessment: Potassium  remains at the low end of the normal range (K+ 3.5 mmol/L on 01/05/24) despite oral potassium supplementation. This represents a significant management challenge and is the primary driver for altering her antihypertensive regimen. Patient adherence to dietary changes has been limited. Plan / Medical Decision-Making:  The decision to change her primary BP medication is a direct result of managing this adverse effect. Will continue potassium supplementation in the interim until the new antihypertensive regimen is initiated. Re-emphasized the  importance of dietary potassium.      Intractable pain   Last Assessment & Plan 06/26/2024 Office Visit Written 06/26/2024 12:53 PM by Jesus Bernardino MATSU, MD  Chronic pain and insomnia secondary to pain   Chronic pain, primarily from the left knee, contributes to insomnia. She avoids frequent use of hydrocodone  and prefers not to use it for sleep due to its short duration of action. Consider gabapentin for pain management and explore potential use of Jurnavix if insurance allows. Continue current pain management with hydrocodone  and Celebrex . Consider non-pharmacological interventions such as a knee brace during sleep.      LVH (left ventricular hypertrophy) due to hypertensive disease, without heart failure   Last Assessment & Plan 08/16/2022 Office Visit Written 08/16/2022  9:44 AM by Raford Riggs, MD  Blood pressure controlled as above.  She has no heart failure symptoms.      Malignant neoplasm of upper-outer quadrant of right breast in female, estrogen receptor positive (HCC)   Neutropenia   Last Assessment & Plan 05/01/2024 Office Visit Written 05/01/2024  8:31 PM by Jesus Bernardino MATSU, MD  She experienced a recent lupus flare with fever, facial rash, and joint pain, managed with Celebrex . Continue hydroxychloroquine with regular eye exams. No steroids are recommended by the rheumatologist. Continue Celebrex  for joint pain and refill hydrocodone  for severe pain episodes.      NSVT (nonsustained ventricular tachycardia) [facility])   Last Assessment & Plan 06/26/2024 Office Visit Written 06/26/2024 12:53 PM by Jesus Bernardino MATSU, MD  Shared decision-making done; patient understood rationale and agreed to [facility]       Other fatigue   Last Assessment & Plan 05/01/2024 Office Visit Written 05/01/2024  8:31 PM by Jesus Bernardino MATSU, MD  Fatigue and drowsiness   She reports two months of fatigue and mild drowsiness, likely due to insufficient sleep and anxiety. A lupus flare is unlikely, but a  nutritional deficiency from a lack of green vegetables is possible. Order CBC, CMP, ESR, CRP, thyroid  panel, urinalysis, iron studies, vitamin D , complement levels, B12, folate, and non-fasting A1c. Recommend Centrum Silver multivitamin with minerals. Share lab results with rheumatologist Jon Learn.      Other microscopic hematuria   Last Assessment & Plan 05/01/2024 Office Visit Written 05/01/2024  8:31 PM by Jesus Bernardino MATSU, MD  Will order lab testing to guide management. Share with rheumatology      Other specified anxiety disorders   Last Assessment & Plan 05/01/2024 Office Visit Written 05/01/2024  8:31 PM by Jesus Bernardino MATSU, MD  Her anxiety may be related to caregiving stress. She is currently on buspirone  15 mg BID and prefers a single medication. SSRIs and SNRIs were discussed, but Prozac is unsuitable due to adverse effects. Consider Lexapro or increasing the buspirone  dose. Increase buspirone  by 5 mg per day every 2-3 days as needed, not exceeding the maximum dose. Consider Lexapro if buspirone  monotherapy fails. Follow up in one month to assess anxiety control.      Personal history of colon  cancer   Last Assessment & Plan 10/31/2023 Office Visit Written 10/31/2023  7:16 PM by Jesus Bernardino MATSU, MD  Colonoscopy 2024 reviewed, no plan follow up for 5 years       Primary localized osteoarthrosis of multiple sites   Proteinuria   Last Assessment & Plan 05/01/2024 Office Visit Written 05/01/2024  8:31 PM by Jesus Bernardino MATSU, MD  Will order lab testing to guide management. Share with rheumatology      Rheumatoid arthritis [facility])   Last Assessment & Plan 05/01/2024 Office Visit Written 05/01/2024  8:31 PM by Jesus Bernardino MATSU, MD  Will order lab testing to guide management. Share with rheumatology      Systemic lupus erythematosus [facility])   Last Assessment & Plan 05/01/2024 Office Visit Written 05/01/2024  8:31 PM by Jesus Bernardino MATSU, MD  She experienced a recent lupus flare with fever,  facial rash, and joint pain, managed with Celebrex . Continue hydroxychloroquine with regular eye exams. No steroids are recommended by the rheumatologist. Continue Celebrex  for joint pain and refill hydrocodone  for severe pain episodes.      Weight disorder   Last Assessment & Plan 01/11/2022 Office Visit Written 01/11/2022  8:42 AM by Raford Riggs, MD  Ms. Nie is done a great job of losing weight.  Her BMI has now decreased to 29.  She will keep working with healthy weight and wellness clinic and exercising.      }  Chart reviewed: has Malignant neoplasm of upper-outer quadrant of right breast in female, estrogen receptor positive (HCC); HTN (hypertension); Hiatal hernia; Systemic lupus erythematosus (HCC); Hypokalemia; Personal history of colon cancer; History of breast cancer; Family history of coronary artery disease in father; Hyperlipidemia with target LDL less than 130; LVH (left ventricular hypertrophy) due to hypertensive disease, without heart failure; Weight disorder; Primary localized osteoarthrosis of multiple sites; Rheumatoid arthritis (HCC); GERD (gastroesophageal reflux disease); Difficulty standing; Hand arthritis; Aortic atherosclerosis; Diverticular disease; Chronic idiopathic constipation; DDD (degenerative disc disease), thoracic; Abnormal finding on imaging of liver; Chronic kidney disease, stage 2 (mild); Diastolic dysfunction; NSVT (nonsustained ventricular tachycardia) (HCC); Adjustment disorder; Chronic pain of left knee; Other specified anxiety disorders; Other microscopic hematuria; Neutropenia; Proteinuria; Frequent urination; Other fatigue; Intractable pain; and Arthritis of knee on their problem list. Chart reviewed:  has a past medical history of Acute pain of right shoulder (11/01/2022), Allergy (1967), Anxiety, Aortic atherosclerosis (11/01/2022), Arthritis, Back pain, Blood transfusion, Blood transfusion without reported diagnosis, Breast cancer (HCC) (2009),  Cataract (12/2023), Chicken pox, Colon cancer (HCC) (2003), Depression, Difficulty standing (09/08/2022), Diverticular disease (11/01/2022), Diverticulitis, Edema of both ankles, Elevated amylase (11/22/2022), Family history of breast cancer (10/24/2017), Flank pain (11/03/2007), Gallbladder problem, Gastric wall thickening (12/15/2022), Gastritis, Generalized abdominal pain (12/15/2022), GERD (gastroesophageal reflux disease), Heart murmur (1954), Hepatitis, Hiatal hernia, History of stomach ulcers, Hypertension, Insomnia (02/17/2021), Intractable abdominal pain (12/15/2022), Joint pain, Lactose intolerance, Lower extremity edema (01/11/2022), Lupus, Multiple gastric ulcers, Palpitations, Personal history of radiation therapy (2009), Prediabetes (09/08/2022), Rheumatic fever, Rheumatoid arthritis (HCC), SLE (systemic lupus erythematosus related syndrome) (HCC), Upper abdominal pain (11/01/2022), Urinary incontinence, and UTI (urinary tract infection). Discussed the use of AI scribe software for clinical note transcription with the patient, who gave verbal consent to proceed.  History of Present Illness CARNITA GOLOB is a 73 year old female with a history of knee replacement who presents with severe left knee pain due to recurrent hemarthrosis.  She experiences severe pain in her left knee, particularly on the right side, and  reports that it keeps filling up with blood and has required repeated drainage. The knee has been drained three times, but further drainage is not planned as she is scheduled for geniculate artery embolization on December 5th.  The pain is described as excruciating, preventing her from bending her knee and necessitating daily icing. She uses a knee brace and a walker when the pain is severe. Pain management includes hydrocodone , taken at night to aid sleep, though it only provides relief for a few hours. She has a high pain tolerance and is cautious about opioid use, preferring to  take them only as needed.  Her social history includes family visiting for Thanksgiving, and she reports a reduction in stress after a family member left her household.   Medications reviewed: Current Outpatient Medications on File Prior to Visit  Medication Sig  . busPIRone  (BUSPAR ) 10 MG tablet Take 2 tablets (20 mg total) by mouth 2 (two) times daily.  . celecoxib  (CELEBREX ) 50 MG capsule Take 1 capsule (50 mg total) by mouth 2 (two) times daily.  . famotidine -calcium  carbonate-magnesium  hydroxide (PEPCID  COMPLETE) 10-800-165 MG chewable tablet CHEW 1 TABLET 2 TIMES DAILYAS NEEDED.  . hydroxychloroquine (PLAQUENIL) 200 MG tablet Take 200 mg by mouth 2 (two) times daily.  . losartan -hydrochlorothiazide  (HYZAAR) 100-12.5 MG tablet Take 1 tablet by mouth daily.  . metoprolol  succinate (TOPROL -XL) 50 MG 24 hr tablet Take 1 tablet (50 mg total) by mouth daily.  . Multiple Vitamins-Minerals (EMERGEN-C IMMUNE PLUS PO) Take 1 tablet by mouth daily.   No current facility-administered medications on file prior to visit.   Medications Discontinued During This Encounter  Medication Reason  . HYDROcodone -acetaminophen  (NORCO/VICODIN) 5-325 MG tablet        Virtual Physical Exam: {Physical Exam} { Physical Exam  } General Appearance:  Well Developed, Well Nourished, No Acute Distress by Limited Video Assessment Pulmonary:  No Respiratory Distress Apparent. Normal Work of Breathing.   Neurological:  Awake, Alert. No Obvious Focal Neurological Deficits or Cognitive Impairments.  Sensorium Seems Unclouded. Psychiatric:  Appropriate Mood, Pleasant Demeanor, Calm, Articulate, Good Mood  {Insert previous labs (optional):23779} {See past labs  Heme  Chem  Endocrine  Serology  Results Review (optional):1}   {Insert previous labs (optional):23779} {See past labs  Heme  Chem  Endocrine  Serology  Results Review (optional):1} No results found for any visits on 08/10/24. Appointment on  07/11/2024  Component Date Value  . Area-P 1/2 07/11/2024 3.08   . S' Lateral 07/11/2024 3.02   . P 1/2 time 07/11/2024 782   . AV Vena cont 07/11/2024 0.28   . Est EF 07/11/2024 55 - 60%   Orders Only on 07/06/2024  Component Date Value  . Vitamin B-12 07/06/2024 355   . Folate 07/06/2024 15.1   Appointment on 07/06/2024  Component Date Value  . Sodium 07/06/2024 139   . Potassium 07/06/2024 4.2   . Chloride 07/06/2024 105   . CO2 07/06/2024 29   . Glucose, Bld 07/06/2024 76   . BUN 07/06/2024 13   . Creatinine 07/06/2024 0.88   . Calcium  07/06/2024 10.2   . Total Protein 07/06/2024 7.3   . Albumin  07/06/2024 4.2   . AST 07/06/2024 20   . ALT 07/06/2024 11   . Alkaline Phosphatase 07/06/2024 94   . Total Bilirubin 07/06/2024 0.5   . GFR, Estimated 07/06/2024 >60   . Anion gap 07/06/2024 5   . WBC Count 07/06/2024 3.5 (L)   . RBC 07/06/2024 4.00   .  Hemoglobin 07/06/2024 12.4   . HCT 07/06/2024 37.1   . MCV 07/06/2024 92.8   . MCH 07/06/2024 31.0   . MCHC 07/06/2024 33.4   . RDW 07/06/2024 12.2   . Platelet Count 07/06/2024 194   . nRBC 07/06/2024 0.0   . Neutrophils Relative % 07/06/2024 40   . Neutro Abs 07/06/2024 1.4 (L)   . Lymphocytes Relative 07/06/2024 40   . Lymphs Abs 07/06/2024 1.4   . Monocytes Relative 07/06/2024 16   . Monocytes Absolute 07/06/2024 0.6   . Eosinophils Relative 07/06/2024 3   . Eosinophils Absolute 07/06/2024 0.1   . Basophils Relative 07/06/2024 1   . Basophils Absolute 07/06/2024 0.0   . Immature Granulocytes 07/06/2024 0   . Abs Immature Granulocytes 07/06/2024 0.00   Office Visit on 06/26/2024  Component Date Value  . Color, Urine 06/26/2024 YELLOW   . APPearance 06/26/2024 CLEAR   . Specific Gravity, Urine 06/26/2024 1.012   . pH 06/26/2024 6.5   . Glucose, UA 06/26/2024 NEGATIVE   . Bilirubin Urine 06/26/2024 NEGATIVE   . Ketones, ur 06/26/2024 NEGATIVE   . Hgb urine dipstick 06/26/2024 NEGATIVE   . Protein, ur  06/26/2024 NEGATIVE   . Nitrites, Initial 06/26/2024 NEGATIVE   . Leukocyte Esterase 06/26/2024 NEGATIVE   . WBC, UA 06/26/2024 NONE SEEN   . RBC / HPF 06/26/2024 NONE SEEN   . Squamous Epithelial / HPF 06/26/2024 NONE SEEN   . Bacteria, UA 06/26/2024 NONE SEEN   . Hyaline Cast 06/26/2024 NONE SEEN   . Note 06/26/2024    . Reflexve Urine Culture 06/26/2024    Office Visit on 05/01/2024  Component Date Value  . SARS Coronavirus 2 Ag 05/01/2024 Negative   . WBC 05/01/2024 4.1   . RBC 05/01/2024 3.94   . Hemoglobin 05/01/2024 12.3   . HCT 05/01/2024 36.7   . MCV 05/01/2024 93.1   . MCHC 05/01/2024 33.6   . RDW 05/01/2024 13.8   . Platelets 05/01/2024 205.0   . Neutrophils Relative % 05/01/2024 40.6 (L)   . Lymphocytes Relative 05/01/2024 36.9   . Monocytes Relative 05/01/2024 17.8 (H)   . Eosinophils Relative 05/01/2024 4.1   . Basophils Relative 05/01/2024 0.6   . Neutro Abs 05/01/2024 1.7   . Lymphs Abs 05/01/2024 1.5   . Monocytes Absolute 05/01/2024 0.7   . Eosinophils Absolute 05/01/2024 0.2   . Basophils Absolute 05/01/2024 0.0   . Sodium 05/01/2024 140   . Potassium 05/01/2024 3.7   . Chloride 05/01/2024 105   . CO2 05/01/2024 29   . Glucose, Bld 05/01/2024 86   . BUN 05/01/2024 11   . Creatinine, Ser 05/01/2024 0.67   . Total Bilirubin 05/01/2024 0.4   . Alkaline Phosphatase 05/01/2024 88   . AST 05/01/2024 15   . ALT 05/01/2024 15   . Total Protein 05/01/2024 6.8   . Albumin  05/01/2024 3.7   . GFR 05/01/2024 85.63   . Calcium  05/01/2024 9.3   . Sed Rate 05/01/2024 19   . CRP 05/01/2024 <1.0   . Vitamin B-12 05/01/2024 311   . Folate 05/01/2024 17.5   . C3 Complement 05/01/2024 181   . C4 Complement 05/01/2024 29   . VITD 05/01/2024 28.39 (L)   . Iron 05/01/2024 92   . TIBC 05/01/2024 373   . %SAT 05/01/2024 25   . Ferritin 05/01/2024 29   . Color, Urine 05/01/2024 DARK YELLOW   . APPearance 05/01/2024 CLEAR   . Specific  Gravity, Urine 05/01/2024 1.022    . pH 05/01/2024 6.5   . Glucose, UA 05/01/2024 NEGATIVE   . Bilirubin Urine 05/01/2024 NEGATIVE   . Ketones, ur 05/01/2024 TRACE (A)   . Hgb urine dipstick 05/01/2024 NEGATIVE   . Protein, ur 05/01/2024 1+ (A)   . Nitrites, Initial 05/01/2024 NEGATIVE   . Leukocyte Esterase 05/01/2024 TRACE (A)   . WBC, UA 05/01/2024 NONE SEEN   . RBC / HPF 05/01/2024 0-2   . Squamous Epithelial / HPF 05/01/2024 0-5   . Bacteria, UA 05/01/2024 NONE SEEN   . Hyaline Cast 05/01/2024 NONE SEEN   . Creatinine, Urine 05/01/2024 200   . Protein/Creat Ratio 05/01/2024 105   . Protein/Creatinine Ratio 05/01/2024 0.105   . Total Protein, Urine 05/01/2024 21   . TSH 05/01/2024 0.705   . Extra Urine Specimen 05/01/2024    . Extra tube recieved 05/01/2024    . Specimen type recieved 05/01/2024 Serum Separator (SST)   . MICRO NUMBER: 05/01/2024 [id number]   . SPECIMEN QUALITY: 05/01/2024 Adequate   . Sample Source 05/01/2024 URINE   . STATUS: 05/01/2024 FINAL   . ISOLATE 1: 05/01/2024 Proteus mirabilis (A)   . REFLEXIVE URINE CULTURE 05/01/2024    Admission on 04/09/2024, Discharged on 04/09/2024  Component Date Value  . Sodium 04/09/2024 139   . Potassium 04/09/2024 3.9   . Chloride 04/09/2024 103   . CO2 04/09/2024 24   . Glucose, Bld 04/09/2024 94   . BUN 04/09/2024 8   . Creatinine, Ser 04/09/2024 0.77   . Calcium  04/09/2024 9.6   . GFR, Estimated 04/09/2024 >60   . Anion gap 04/09/2024 12   . WBC 04/09/2024 4.7   . RBC 04/09/2024 3.80 (L)   . Hemoglobin 04/09/2024 11.9 (L)   . HCT 04/09/2024 35.6 (L)   . MCV 04/09/2024 93.7   . [facility] 04/09/2024 31.3   . MCHC 04/09/2024 33.4   . RDW 04/09/2024 12.9   . Platelets 04/09/2024 185   . nRBC 04/09/2024 0.0   . Troponin T High Sensitiv* 04/09/2024 <15   . Total Protein 04/09/2024 7.1   . Albumin  04/09/2024 4.2   . AST 04/09/2024 28   . ALT 04/09/2024 17   . Alkaline Phosphatase 04/09/2024 143 (H)   . Total Bilirubin 04/09/2024 0.3   . Bilirubin,  Direct 04/09/2024 0.1   . Indirect Bilirubin 04/09/2024 0.2 (L)   . Lipase 04/09/2024 16   . SARS Coronavirus 2 by RT* 04/09/2024 POSITIVE (A)   . Influenza A by PCR 04/09/2024 NEGATIVE   . Influenza B by PCR 04/09/2024 NEGATIVE   . Resp Syncytial Virus by * 04/09/2024 NEGATIVE   Office Visit on 03/09/2024  Component Date Value  . Creatinine, Urine 03/09/2024 215   . Protein/Creat Ratio 03/09/2024 88   . Protein/Creatinine Ratio 03/09/2024 0.088   . Total Protein, Urine 03/09/2024 19   . Color, Urine 03/09/2024 YELLOW   . APPearance 03/09/2024 CLEAR   . Specific Gravity, Urine 03/09/2024 1.024   . pH 03/09/2024 6.0   . Glucose, UA 03/09/2024 NEGATIVE   . Bilirubin Urine 03/09/2024 NEGATIVE   . Ketones, ur 03/09/2024 TRACE (A)   . Hgb urine dipstick 03/09/2024 NEGATIVE   . Protein, ur 03/09/2024 1+ (A)   . Nitrites, Initial 03/09/2024 NEGATIVE   . Leukocyte Esterase 03/09/2024 NEGATIVE   . WBC, UA 03/09/2024 0-5   . RBC / HPF 03/09/2024 0-2   . Squamous Epithelial / HPF 03/09/2024 0-5   .  Bacteria, UA 03/09/2024 FEW (A)   . Hyaline Cast 03/09/2024 NONE SEEN   . Note 03/09/2024    . Reflexve Urine Culture 03/09/2024    Office Visit on 03/07/2024  Component Date Value  . SARS Coronavirus 2 Ag 03/07/2024 Negative   . Influenza A, POC 03/07/2024 Negative   . Influenza B, POC 03/07/2024 Negative   Office Visit on 02/07/2024  Component Date Value  . Color, UA 02/07/2024 YELLOW   . Clarity, UA 02/07/2024 CLEAR   . Glucose, UA 02/07/2024 Negative   . Bilirubin, UA 02/07/2024 NEG   . Ketones, UA 02/07/2024 NEG   . Spec Grav, UA 02/07/2024 1.020   . Blood, UA 02/07/2024 NEG   . pH, UA 02/07/2024 6.0   . Protein, UA 02/07/2024 Negative   . Urobilinogen, UA 02/07/2024 0.2   . Nitrite, UA 02/07/2024 NEG   . Leukocytes, UA 02/07/2024 Negative   . MICRO NUMBER: 02/07/2024 [id number]   . SPECIMEN QUALITY: 02/07/2024 Adequate   . Sample Source 02/07/2024 URINE   . STATUS: 02/07/2024  FINAL   . Result: 02/07/2024 Less than 10,000 CFU/mL of single Gram positive organism isolated. No further testing will be performed. If clinically indicated, recollection using a method to minimize contamination, with prompt transfer to Urine Culture Transport Tube, is recommended.   Appointment on 01/05/2024  Component Date Value  . Sodium 01/05/2024 140   . Potassium 01/05/2024 3.5   . Chloride 01/05/2024 105   . CO2 01/05/2024 30   . Glucose, Bld 01/05/2024 83   . BUN 01/05/2024 15   . Creatinine 01/05/2024 0.82   . Calcium  01/05/2024 9.6   . Total Protein 01/05/2024 7.2   . Albumin  01/05/2024 4.1   . AST 01/05/2024 20   . ALT 01/05/2024 13   . Alkaline Phosphatase 01/05/2024 85   . Total Bilirubin 01/05/2024 0.5   . GFR, Estimated 01/05/2024 >60   . Anion gap 01/05/2024 5   . Specimen Description 01/05/2024                     Value:URINE, CLEAN CATCH Performed at [facility], [address] [place], [place] [address]   . Special Requests 01/05/2024                     Value:NONE Performed at [facility], [address], [place], [place] [address]   . Culture 01/05/2024                     Value:NO GROWTH Performed at [facility], [address] [place], [place] [address]   . Report Status 01/05/2024 01/06/2024 FINAL   . Color, Urine 01/05/2024 YELLOW   . APPearance 01/05/2024 CLEAR   . Specific Gravity, Urine 01/05/2024 1.021   . pH 01/05/2024 5.0   . Glucose, UA 01/05/2024 NEGATIVE   . Hgb urine dipstick 01/05/2024 NEGATIVE   . Bilirubin Urine 01/05/2024 NEGATIVE   . Ketones, ur 01/05/2024 NEGATIVE   . Protein, ur 01/05/2024 30 (A)   . Nitrite 01/05/2024 NEGATIVE   . Leukocytes,Ua 01/05/2024 NEGATIVE   . RBC / HPF 01/05/2024 0-5   . WBC, UA 01/05/2024 0-5   . Bacteria, UA 01/05/2024 NONE SEEN   . Squamous Epithelial / HPF 01/05/2024 0-5   . Mucus 01/05/2024 PRESENT   . Hyaline Casts, UA 01/05/2024  PRESENT   . WBC 01/05/2024 3.5 (L)   . RBC  01/05/2024 3.89   . Hemoglobin 01/05/2024 12.3   . HCT 01/05/2024 35.8 (L)   . MCV 01/05/2024 92.0   . [facility] 01/05/2024 31.6   . MCHC 01/05/2024 34.4   . RDW 01/05/2024 12.6   . Platelets 01/05/2024 155   . nRBC 01/05/2024 0.0   . Neutrophils Relative % 01/05/2024 23   . Neutro Abs 01/05/2024 0.8 (L)   . Lymphocytes Relative 01/05/2024 54   . Lymphs Abs 01/05/2024 1.9   . Monocytes Relative 01/05/2024 19   . Monocytes Absolute 01/05/2024 0.7   . Eosinophils Relative 01/05/2024 3   . Eosinophils Absolute 01/05/2024 0.1   . Basophils Relative 01/05/2024 1   . Basophils Absolute 01/05/2024 0.0   . Immature Granulocytes 01/05/2024 0   . Abs Immature Granulocytes 01/05/2024 0.01   There may be more visits with results that are not included.  No image results found. MM 3D SCREENING MAMMOGRAM BILATERAL BREAST Result Date: 08/07/2024 CLINICAL DATA:  Screening. EXAM: DIGITAL SCREENING BILATERAL MAMMOGRAM WITH TOMOSYNTHESIS AND CAD TECHNIQUE: Bilateral screening digital craniocaudal and mediolateral oblique mammograms were obtained. Bilateral screening digital breast tomosynthesis was performed. The images were evaluated with computer-aided detection. COMPARISON:  Previous exam(s). ACR Breast Density Category b: There are scattered areas of fibroglandular density. FINDINGS: There are no findings suspicious for malignancy. IMPRESSION: No mammographic evidence of malignancy. A result letter of this screening mammogram will be mailed directly to the patient. RECOMMENDATION: Screening mammogram in one year. (Code:SM-B-01Y) BI-RADS CATEGORY  1: Negative. Electronically Signed   By: Alm Parkins M.D.   On: 08/07/2024 14:25   ECHOCARDIOGRAM COMPLETE Result Date: 07/11/2024    ECHOCARDIOGRAM REPORT   Patient Name:   Deniese M Artist Date of Exam: 07/11/2024 Medical Rec #:  [id number]         Height:       67.0 in Accession #:    [id number]        Weight:        184.9 lb Date of Birth:  1951-01-02         BSA:          1.956 m Patient Age:    73 years          BP:           135/70 mmHg Patient Gender: F                 HR:           60 bpm. Exam Location:  Outpatient Procedure: 2D Echo, 3D Echo, Cardiac Doppler, Color Doppler and Strain Analysis            (Both Spectral and Color Flow Doppler were utilized during            procedure). Indications:    Dyspnea  History:        Patient has prior history of Echocardiogram examinations, most                 recent 07/20/2018. Risk Factors:Hypertension and Non-Smoker.                 LVH; NSVT; History of breast cancer.  Sonographer:    Orvil Holmes RDCS Referring Phys: [id number] CAITLIN S WALKER IMPRESSIONS  1. Left ventricular ejection fraction, by estimation, is 55 to 60%. Left ventricular ejection fraction by 3D volume is 55 %. The left ventricle has normal function. The left ventricle has no regional wall motion abnormalities. Left ventricular diastolic  parameters  are consistent with Grade I diastolic dysfunction (impaired relaxation). The average left ventricular global longitudinal strain is -19.5 %. The global longitudinal strain is normal.  2. Right ventricular systolic function is normal. The right ventricular size is normal. There is normal pulmonary artery systolic pressure. The estimated right ventricular systolic pressure is 31.7 mmHg.  3. The mitral valve is normal in structure. No evidence of mitral valve regurgitation. No evidence of mitral stenosis.  4. The aortic valve is tricuspid. Aortic valve regurgitation is trivial. No aortic stenosis is present.  5. The inferior vena cava is normal in size with greater than 50% respiratory variability, suggesting right atrial pressure of 3 mmHg. FINDINGS  Left Ventricle: Left ventricular ejection fraction, by estimation, is 55 to 60%. Left ventricular ejection fraction by 3D volume is 55 %. The left ventricle has normal function. The left ventricle has no regional  wall motion abnormalities. The average left ventricular global longitudinal strain is -19.5 %. Strain was performed and the global longitudinal strain is normal. The left ventricular internal cavity size was normal in size. There is no left ventricular hypertrophy. Left ventricular diastolic parameters are consistent with Grade I diastolic dysfunction (impaired relaxation). Right Ventricle: The right ventricular size is normal. No increase in right ventricular wall thickness. Right ventricular systolic function is normal. There is normal pulmonary artery systolic pressure. The tricuspid regurgitant velocity is 2.68 m/s, and  with an assumed right atrial pressure of 3 mmHg, the estimated right ventricular systolic pressure is 31.7 mmHg. Left Atrium: Left atrial size was normal in size. Right Atrium: Right atrial size was normal in size. Pericardium: There is no evidence of pericardial effusion. Mitral Valve: The mitral valve is normal in structure. No evidence of mitral valve regurgitation. No evidence of mitral valve stenosis. Tricuspid Valve: The tricuspid valve is normal in structure. Tricuspid valve regurgitation is trivial. No evidence of tricuspid stenosis. Aortic Valve: The aortic valve is tricuspid. Aortic valve regurgitation is trivial. Aortic regurgitation PHT measures 782 msec. No aortic stenosis is present. Pulmonic Valve: The pulmonic valve was normal in structure. Pulmonic valve regurgitation is not visualized. No evidence of pulmonic stenosis. Aorta: The aortic root and ascending aorta are structurally normal, with no evidence of dilitation. Venous: The inferior vena cava is normal in size with greater than 50% respiratory variability, suggesting right atrial pressure of 3 mmHg. IAS/Shunts: No atrial level shunt detected by color flow Doppler. Additional Comments: 3D was performed not requiring image post processing on an independent workstation and was normal.  LEFT VENTRICLE PLAX 2D LVIDd:          4.76 cm         Diastology LVIDs:         3.02 cm         LV e' medial:    4.13 cm/s LV PW:         1.00 cm         LV E/e' medial:  14.1 LV IVS:        0.83 cm         LV e' lateral:   5.33 cm/s LVOT diam:     2.00 cm         LV E/e' lateral: 10.9 LVOT Area:     3.14 cm                                2D Longitudinal  Strain                                2D Strain GLS   -17.0 %                                (A4C):                                2D Strain GLS   -20.6 %                                (A3C):                                2D Strain GLS   -20.9 %                                (A2C):                                2D Strain GLS   -19.5 %                                Avg:                                 3D Volume EF                                LV 3D EF:    Left                                             ventricul                                             ar                                             ejection                                             fraction                                             by 3D  volume is                                             55 %.                                 3D Volume EF:                                3D EF:        55 %                                LV EDV:       138 ml                                LV ESV:       62 ml                                LV SV:        76 ml RIGHT VENTRICLE RV Basal diam:  4.10 cm     PULMONARY VEINS RV Mid diam:    3.45 cm     A Reversal Velocity: 27.60 cm/s RV S prime:     13.40 cm/s  Diastolic Velocity:  51.30 cm/s TAPSE (M-mode): 2.5 cm      S/D Velocity:        1.50                             Systolic Velocity:   78.90 cm/s LEFT ATRIUM             Index        RIGHT ATRIUM           Index LA diam:        4.00 cm 2.05 cm/m   RA Area:     20.50 cm LA Vol (A2C):   58.2 ml 29.76 ml/m  RA Volume:   54.20 ml  27.71 ml/m LA Vol (A4C):   51.7 ml  26.43 ml/m LA Biplane Vol: 55.1 ml 28.17 ml/m  AORTIC VALVE AI PHT:            782 msec AR Vena Contracta: 0.28 cm  AORTA Ao Root diam: 3.30 cm Ao Asc diam:  3.70 cm MITRAL VALVE               TRICUSPID VALVE MV Area (PHT): 3.08 cm    TR Peak grad:   28.7 mmHg MV Decel Time: 246 msec    TR Vmax:        268.00 cm/s MV E velocity: 58.20 cm/s MV A velocity: 74.40 cm/s  SHUNTS MV E/A ratio:  0.78        Systemic Diam: 2.00 cm Jerel Croitoru MD Electronically signed by Jerel Balding MD Signature Date/Time: 07/11/2024/11:32:45 AM    Final    IR Radiologist Eval & Mgmt Result Date: 07/09/2024 EXAM: NEW PATIENT OFFICE VISIT CHIEF COMPLAINT: See Epic note. HISTORY OF PRESENT ILLNESS:  See Epic note. REVIEW OF SYSTEMS: See Epic note. PHYSICAL EXAMINATION: See Epic note. ASSESSMENT AND PLAN: See Epic note. Ester Sides, MD [facility] Electronically Signed   By: Ester Sides M.D.   On: 07/09/2024 10:25   NM Bone Scan 3 Phase Result Date: 06/11/2024 CLINICAL DATA:  LEFT knee pain.  Knee arthroplasty. EXAM: NUCLEAR MEDICINE 3-PHASE BONE SCAN TECHNIQUE: Radionuclide angiographic images, immediate static blood pool images, and 3-hour delayed static images were obtained of the after intravenous injection of radiopharmaceutical. RADIOPHARMACEUTICALS:  18.5 mCi Tc-66m MDP IV COMPARISON:  Radiograph and CT 04/21/2024 FINDINGS: Vascular phase: Mild increased blood flow to the LEFT knee. Blood pool phase: Mild asymmetric increased blood pool activity in the LEFT knee loca primarily above the joint Delayed phase: Asymmetric increased uptake within the periprosthetic bone of the LEFT knee involving the femoral condyles as well as the tibial plateau. IMPRESSION: Increased activity in the vascular phase and delayed phase associated with the LEFT knee prosthetic. Findings are concerning for loosening or infection of the LEFT knee prosthetic. Electronically Signed   By:  Jackquline Boxer M.D.   On: 06/11/2024 14:39  MM 3D SCREENING MAMMOGRAM BILATERAL BREAST Result Date: 08/07/2024 CLINICAL DATA:  Screening. EXAM: DIGITAL SCREENING BILATERAL MAMMOGRAM WITH TOMOSYNTHESIS AND CAD TECHNIQUE: Bilateral screening digital craniocaudal and mediolateral oblique mammograms were obtained. Bilateral screening digital breast tomosynthesis was performed. The images were evaluated with computer-aided detection. COMPARISON:  Previous exam(s). ACR Breast Density Category b: There are scattered areas of fibroglandular density. FINDINGS: There are no findings suspicious for malignancy. IMPRESSION: No mammographic evidence of malignancy. A result letter of this screening mammogram will be mailed directly to the patient. RECOMMENDATION: Screening mammogram in one year. (Code:SM-B-01Y) BI-RADS CATEGORY  1: Negative. Electronically Signed   By: Alm Parkins M.D.   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 Geniculate artery embolization is scheduled for November 5th, 2025, with expected improvement in 62-92% of patients at 6-12 months. Pain management is crucial until the procedure. She is opioid tolerant and takes hydrocodone  at night for temporary relief. Discussed pain management options, including hydrocodone  and Jurnavix, with concerns about Jurnavix's cost and insurance coverage. She prefers not to use an opioid patch due to dependency concerns. Jurnavix, a new pain medication, does not cause a drugged feeling and is effective for short-term use, but it is expensive and may not be  covered by insurance. Prescribed hydrocodone  10 mg, 60 tablets, for use as needed until the procedure. Sent a prescription for Jurnavix, noting potential insurance coverage issues and high cost. Advised against driving or drinking while taking hydrocodone  and instructed her to contact if pharmacy issues arise with prescriptions.  Recording duration: 13 minutes   }  ORDER ASSOCIATIONS  #   DIAGNOSIS / CONDITION ICD-10 ENCOUNTER ORDER     ICD-10-CM   1. Arthritis of knee  M17.10 Suzetrigine  (JOURNAVX ) 50 MG TABS    HYDROcodone -acetaminophen  (NORCO) 10-325 MG tablet    2. Intractable pain  R52 Suzetrigine  (JOURNAVX ) 50 MG TABS    HYDROcodone -acetaminophen  (NORCO) 10-325 MG tablet  Orders Placed in Encounter:   Lab Orders  No laboratory test(s) ordered today   Imaging Orders  No imaging studies ordered today   Referral Orders  No referral(s) requested today   Meds ordered this encounter  Medications  . Suzetrigine  (JOURNAVX ) 50 MG TABS    Sig: Take 1 tablet by mouth 2 (two) times daily as needed.    Dispense:  60 tablet    Refill:  5  . HYDROcodone -acetaminophen  (NORCO) 10-325 MG tablet    Sig: Take 1 tablet by mouth every 8 (eight) hours as needed for up to 20 days for severe pain (pain score 7-10).    Dispense:  60 tablet    Refill:  0    No orders of the defined types were placed in this encounter.  ED Discharge Orders          Ordered    Suzetrigine  (JOURNAVX ) 50 MG TABS  2 times daily PRN        08/10/24 1211    HYDROcodone -acetaminophen  (NORCO) 10-325 MG tablet  Every 8 hours PRN        08/10/24 1211                Treatment plan discussed and reviewed in detail. Explained medication safety and potential side effects.  Answered all patient questions and confirmed understanding and comfort with the plan. Encouraged patient to contact our office if they have any questions or concerns.  Agreed on patient coming for a sooner office visit if symptoms  worsen, persist, or new symptoms develop. Discussed precautions in case of needing to visit the Emergency Department.    ----------------------------------------------------- Attestation:  Today's Healthcare Provider Bernardino KANDICE Cone, MD was located at office at [facility] [address], [place] [address].  The patient was located at home***. All video encounter participant identities and locations confirmed visually and verbally.Today's Telemedicine visit was conducted via synchronous Video after consent for telemedicine was obtained:  {Video Connection Time Percentage:[id number]::Video connection was never lost}   This document was transcribed and resynthesized, in part, by artificial intelligence ([employer]bridge)  using HIPAA-compliant recording of the clinical interaction;   We have discussed the our use of AI scribe software for clinical note transcription with the patient, who has given verbal consent to proceed.

## 2024-08-11 NOTE — Assessment & Plan Note (Signed)
 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 Geniculate artery embolization is scheduled for November 5th, 2025, with expected improvement in 62-92% of patients at 6-12 months. Pain management is crucial until the procedure. She is opioid tolerant and takes hydrocodone  at night for temporary relief. Discussed pain management options, including hydrocodone  and Jurnavix, with concerns about Jurnavix's cost and insurance coverage. She prefers not to use an opioid patch due to dependency concerns. Jurnavix, a new pain medication, does not cause a drugged feeling and is effective for short-term use, but it is expensive and may not be covered by insurance. Prescribed hydrocodone  10 mg, 60 tablets, for use as needed until the procedure. Sent a prescription for Jurnavix, noting potential insurance coverage issues and high cost. Advised against driving or drinking while taking hydrocodone  and instructed her to contact if pharmacy issues arise with prescriptions. PDMP reviewed during this encounter.

## 2024-08-11 NOTE — Patient Instructions (Signed)
 It was a pleasure seeing you today! Your health and satisfaction are our top priorities.  Jaclyn Cone, MD  VISIT SUMMARY: Today, we discussed your severe left knee pain due to recurrent hemarthrosis and primary osteoarthritis. We reviewed your pain management options and planned for your upcoming geniculate artery embolization.  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 We have scheduled a geniculate artery embolization for December 5th, which should help reduce your pain significantly over the next 6-12 months. In the meantime, you should continue to manage your pain with hydrocodone , taking 10 mg as needed, especially at night. We also discussed Jurnavix, a new pain medication that may be effective for short-term use, but it is expensive and may not be covered by your insurance. Please avoid driving or drinking alcohol  while taking hydrocodone . If you encounter any issues with your prescriptions at the pharmacy, contact us  immediately.  INSTRUCTIONS: Your geniculate artery embolization is scheduled for December 5th, 2025. Please continue to manage your pain with the prescribed medications and follow the advice given. If you have any issues with your prescriptions or need further assistance, do not hesitate to contact our office.  Your Providers PCP: Jaclyn Jaclyn MATSU, MD,  [phone]) Referring Provider: [name], MD,  [phone]) Care Team Provider: Ishmael Slough, MD,  [phone]) Care Team Provider: Kerman Vina HERO, NP,  [phone]) Care Team Provider: Murrell Drivers, MD,  [phone]) Care Team Provider: Raford Riggs, MD,  [phone]) Care Team Provider: Loretha Ash, MD,  [phone]) Care Team Provider: Aneita Gwendlyn DASEN, MD  NEXT  STEPS: [x]  Early Intervention: Schedule sooner appointment, call our on-call services, or go to emergency room if there is any significant Increase in pain or discomfort New or worsening symptoms Sudden or severe changes in your health [x]  Flexible Follow-Up: We recommend a No follow-ups on file. for optimal routine care. This allows for progress monitoring and treatment adjustments. [x]  Preventive Care: Schedule your annual preventive care visit! It's typically covered by insurance and helps identify potential health issues early. [x]  Lab & X-ray Appointments: Incomplete tests scheduled today, or call to schedule. X-rays: [facility] (M-F, 8:30am-noon or 1pm-5pm). [x]  Medical Information Release: Sign a release form at front desk to obtain relevant medical information we don't have.  MAKING THE MOST OF OUR FOCUSED 20 MINUTE APPOINTMENTS: [x]   Clearly state your top concerns at the beginning of the visit to focus our discussion [x]   If you anticipate you will need more time, please inform the front desk during scheduling - we can book multiple appointments in the same week. [x]   If you have transportation problems- use our convenient video appointments or ask about transportation support. [x]   We can get down to business faster if you use MyChart to update information before the visit and submit non-urgent questions before your visit. Thank you for taking the time to provide details through MyChart.  Let our nurse know and she can import this information into your encounter documents.  Arrival and Wait Times: [x]   Arriving on time ensures that everyone receives prompt attention. [x]   Early morning (8a) and afternoon (1p) appointments tend to have shortest wait times. [x]   Unfortunately, we cannot delay appointments for late arrivals or hold slots during phone calls.  Getting Answers and Following Up [x]   Simple Questions &  Concerns: For quick questions or basic follow-up after  your visit, reach us  at [phone] or MyChart messaging. [x]   Complex Concerns: If your concern is more complex, scheduling an appointment might be best. Discuss this with the staff to find the most suitable option. [x]   Lab & Imaging Results: We'll contact you directly if results are abnormal or you don't use MyChart. Most normal results will be on MyChart within 2-3 business days, with a review message from Dr. Jesus. Haven't heard back in 2 weeks? Need results sooner? Contact us  at [phone]. [x]   Referrals: Our referral coordinator will manage specialist referrals. The specialist's office should contact you within 2 weeks to schedule an appointment. Call us  if you haven't heard from them after 2 weeks.  Staying Connected [x]   MyChart: Activate your MyChart for the fastest way to access results and message us . See the last page of this paperwork for instructions on how to activate.  Bring to Your Next Appointment [x]   Medications: Please bring all your medication bottles to your next appointment to ensure we have an accurate record of your prescriptions. [x]   Health Diaries: If you're monitoring any health conditions at home, keeping a diary of your readings can be very helpful for discussions at your next appointment.  Billing [x]   X-ray & Lab Orders: These are billed by separate companies. Contact the invoicing company directly for questions or concerns. [x]   Visit Charges: Discuss any billing inquiries with our administrative services team.  Your Satisfaction Matters [x]   Share Your Experience: We strive for your satisfaction! If you have any complaints, or preferably compliments, please let Dr. Jesus know directly or contact our Practice Administrators, Manuelita Rubin or [employer], by asking at the front desk.   Reviewing Your Records [x]   Review this early draft of your clinical encounter notes below and the final encounter summary tomorrow on MyChart after its been  completed.  All orders placed so far are visible here: Arthritis of knee Assessment & Plan: Chronic left knee pain due to primary osteoarthritis with recurrent hemarthrosis, pending geniculate artery embolization   She experiences severe chronic left knee pain from primary osteoarthritis with recurrent hemarthrosis, which is excruciating and prevents knee bending, necessitating daily icing. Geniculate artery embolization is scheduled for November 5th, 2025, with expected improvement in 62-92% of patients at 6-12 months. Pain management is crucial until the procedure. She is opioid tolerant and takes hydrocodone  at night for temporary relief. Discussed pain management options, including hydrocodone  and Jurnavix, with concerns about Jurnavix's cost and insurance coverage. She prefers not to use an opioid patch due to dependency concerns. Jurnavix, a new pain medication, does not cause a drugged feeling and is effective for short-term use, but it is expensive and may not be covered by insurance. Prescribed hydrocodone  10 mg, 60 tablets, for use as needed until the procedure. Sent a prescription for Jurnavix, noting potential insurance coverage issues and high cost. Advised against driving or drinking while taking hydrocodone  and instructed her to contact if pharmacy issues arise with prescriptions. PDMP reviewed during this encounter.   Orders: -     Journavx ; Take 1 tablet by mouth 2 (two) times daily as needed.  Dispense: 60 tablet; Refill: 5 -     HYDROcodone -Acetaminophen ; Take 1 tablet by mouth every 8 (eight) hours as needed for up to 20 days for severe pain (pain score 7-10).  Dispense: 60 tablet; Refill: 0  Intractable pain Assessment & Plan: Chronic left knee pain due  to primary osteoarthritis with recurrent hemarthrosis, pending geniculate artery embolization   She experiences severe chronic left knee pain from primary osteoarthritis with recurrent hemarthrosis, which is excruciating and  prevents knee bending, necessitating daily icing. Geniculate artery embolization is scheduled for November 5th, 2025, with expected improvement in 62-92% of patients at 6-12 months. Pain management is crucial until the procedure. She is opioid tolerant and takes hydrocodone  at night for temporary relief. Discussed pain management options, including hydrocodone  and Jurnavix, with concerns about Jurnavix's cost and insurance coverage. She prefers not to use an opioid patch due to dependency concerns. Jurnavix, a new pain medication, does not cause a drugged feeling and is effective for short-term use, but it is expensive and may not be covered by insurance. Prescribed hydrocodone  10 mg, 60 tablets, for use as needed until the procedure. Sent a prescription for Jurnavix, noting potential insurance coverage issues and high cost. Advised against driving or drinking while taking hydrocodone  and instructed her to contact if pharmacy issues arise with prescriptions. PDMP reviewed during this encounter.   Orders: -     Journavx ; Take 1 tablet by mouth 2 (two) times daily as needed.  Dispense: 60 tablet; Refill: 5 -     HYDROcodone -Acetaminophen ; Take 1 tablet by mouth every 8 (eight) hours as needed for up to 20 days for severe pain (pain score 7-10).  Dispense: 60 tablet; Refill: 0

## 2024-08-20 ENCOUNTER — Telehealth: Payer: Self-pay

## 2024-08-20 MED ORDER — DIPHENHYDRAMINE HCL 50 MG PO TABS: 50.0000 mg | ORAL_TABLET | Freq: Once | ORAL | 0 refills | Status: DC

## 2024-08-20 MED ORDER — PREDNISONE 50 MG PO TABS: ORAL_TABLET | ORAL | 0 refills | Status: DC

## 2024-08-20 NOTE — Progress Notes (Signed)
 This RN called pt pharmacy to give verbal order for 50 mg PO benadryl  for 13 hour prep, unable to e-scribe

## 2024-08-20 NOTE — Progress Notes (Signed)
 See telephone note  13 hour prep:  Patient to take 50 mg of prednisone  on 08/23/2024 at 8:45 pm, 50 mg of prednisone  on 08/24/2024 at 2:45 am, and 50 mg of prednisone  on 08/24/2024 at 8:45 am. Pt is also to take 50 mg of benadryl  on 08/24/2024 at 8:45 am. Please call [phone] with any questions.

## 2024-08-20 NOTE — Progress Notes (Signed)
 See telephone note

## 2024-08-20 NOTE — Discharge Instructions (Signed)

## 2024-08-21 ENCOUNTER — Telehealth: Payer: Self-pay

## 2024-08-21 MED ORDER — DIPHENHYDRAMINE HCL 50 MG PO TABS: 50.0000 mg | ORAL_TABLET | Freq: Once | ORAL | 0 refills | Status: AC

## 2024-08-21 MED ORDER — PREDNISONE 50 MG PO TABS: ORAL_TABLET | ORAL | 0 refills | Status: DC

## 2024-08-21 NOTE — Progress Notes (Signed)
 13 hour prep   : Patient to take 50 mg of prednisone  on 08/23/2024 at 8:45 pm, 50 mg of prednisone  on 08/24/2024 at 2:45 am, and 50 mg of prednisone  on 08/24/2024 at 8:45 am. Pt is also to take 50 mg of benadryl  on 08/24/2024 at 8:45 am. Please call [phone] with any questions.

## 2024-08-21 NOTE — Progress Notes (Signed)
 SABRA

## 2024-08-21 NOTE — Progress Notes (Signed)
13 hour prep

## 2024-08-23 NOTE — H&P (Addendum)
 Chief Complaint: Patient was seen in consultation today for left knee pain.   Referring Physician(s): Marchwiany,Daniel A   Supervising Physician: Jennefer Rover  Patient Status: [facility] - Out[facility].   History of Present Illness: [name] is a 73 y.o. female with a medical history significant for right breast cancer, HTN, anxiety/depression, diverticulitis, rheumatoid arthritis and lupus. She also has a history of left knee pain and is s/p left knee arthroplasty in 2006 followed by left and right total knee arthroplasties in 2012. She has had significant left knee pain ever since her surgery.    She was recently seen by her PCP for evaluation of her left knee pain which interferes with her sleep and has also likely contributed to her blood pressure being elevated. She is taking celebrex  several times daily and hydrocodone  as needed. A synovial fluid analysis of the left knee showed inflammation but no infection. A bone scan was also obtained and this indicated increased blood flow and possible autoimmune attack.    Due to her autoimmune conditions she would be at increased risk for complications with another knee surgery. The patient was referred to Interventional Radiology to explore geniculate artery embolization as a possible treatment option and she met with Dr. Jennefer in consultation 07/09/24.   She was considered an excellent candidate for geniculate artery embolization and they  discussed the rationale, periprocedural expectations, and long term expected outcomes after geniculate artery embolization.  She was amenable and ready to proceed.    Past Medical History:  Diagnosis Date   Acute pain of right shoulder 11/01/2022   Allergy 1967   Anxiety    Aortic atherosclerosis 11/01/2022   On 2022 CT abdomen.   Arthritis    R knee, hands    Back pain    Blood transfusion    1986- post-partial hysterectomy   Blood transfusion without reported diagnosis     Breast cancer (HCC) 2009   right lumpectomy and radiation   Cataract 12/2023   both eyes   Chicken pox    Colon cancer (HCC) 2003   per pt report, cancer found in a colon polyp in 2003, procedure done in [place].  did not require surgery, chemo etc.     Depression    Difficulty standing 09/08/2022   Due to hip pain mainly- she agreed to physical therapy if insurance will cover   Diverticular disease 11/01/2022   On 2022 CT abd   Diverticulitis    Edema of both ankles    Elevated amylase 11/22/2022   Amylase                 Component    Value    Date/Time           AMYLASE    57    02/07/2023 1054        Family history of breast cancer 10/24/2017   Flank pain 11/03/2007   Urgent care told UTI responsible but she reports she didn't have one.  History of kidney stones and kind of feels like that  Started 3 weeks ago            Gallbladder problem    Gastric wall thickening 12/15/2022   11/2022 CT abdomen: Focal nodular wall thickening is seen at the GE junction and gastric fundus and gastric carcinoma cannot be excluded. Remainder of the stomach is unremarkable. No evidence of  obstruction, inflammatory process or abnormal fluid collections.  Diverticulosis is seen mainly involving the  descending and sigmoid colon, however there is no evidence of diverticulitis.     Had esophagod   Gastritis    Generalized abdominal pain 12/15/2022   GERD (gastroesophageal reflux disease)    recently taken off anti reflux tx   Heart murmur 1954   Hepatitis    Hep. A- 1978   Hiatal hernia    History of stomach ulcers    Hypertension    Insomnia 02/17/2021   Flared with seeing sister summer 2024  Difficulty falling asleep and waking up  She snores but not aware of apnea-never had sleep study  She hasn't tried medication(s)  Getting about 4 hours nightly  No caffeine relationship  Doesn't seem related with arthritis  Is associated with racing thoughts.  Music and bible helps.         Intractable abdominal pain  12/15/2022   01/13/2023 interim history:   carafate , bentyl  (price ineffective), glycopyrrolate  (dry mouth) all stopped  Maalox helps, would like to dc proton pump inhibitor (PPI) stomach acid reducer.      Prior history  IMPRESSION:  Focal nodular wall thickening at the GE junction and gastric fundus.  Gastric carcinoma cannot be excluded. Consider upper endoscopy or  upper GI series for further evaluation.      Joint pain    Lactose intolerance    Lower extremity edema 01/11/2022   Lupus    Multiple gastric ulcers    Palpitations    Personal history of radiation therapy 2009   Prediabetes 09/08/2022   Lab Results Component Value Date  HGBA1C 5.7 (H) 06/29/2021     Rheumatic fever    Rheumatoid arthritis (HCC)    SLE (systemic lupus erythematosus related syndrome) (HCC)    Upper abdominal pain 11/01/2022   Radiates to the back and lower abdomen.has been present since late 2023 and we do not have a CAT scan yet for it.  X-ray abdomen showed only degenerative disc disease and chronic constipation.  Has visit planned with gastrointestinal specialist 01/04/23     Has been taking bentyl  and hydrocodone  and it helps  Has not been taking prescribed omeprazole  and carafate  due to pharmacist misunderstanding    Urinary incontinence    UTI (urinary tract infection)     Past Surgical History:  Procedure Laterality Date   ABDOMINAL HYSTERECTOMY     1986   APPENDECTOMY     1978 & tubal ligation    BIOPSY  07/25/2018   Procedure: BIOPSY;  Surgeon: Teressa Toribio SQUIBB, MD;  Location: [facility] ENDOSCOPY;  Service: Endoscopy;;   BREAST EXCISIONAL BIOPSY Left 1998   BREAST LUMPECTOMY Bilateral    2009   BREAST SURGERY     R breast lumpectomy   CHOLECYSTECTOMY     1978- open    ESOPHAGOGASTRODUODENOSCOPY (EGD) WITH PROPOFOL  N/A 07/25/2018   Procedure: ESOPHAGOGASTRODUODENOSCOPY (EGD) WITH PROPOFOL ;  Surgeon: Teressa Toribio SQUIBB, MD;  Location: [facility] ENDOSCOPY;  Service: Endoscopy;  Laterality: N/A;   IR  RADIOLOGIST EVAL & MGMT  07/09/2024   JOINT REPLACEMENT  2006, 2013   REPLACEMENT TOTAL KNEE Left    TONSILLECTOMY     1954   TOTAL KNEE ARTHROPLASTY  08/30/2011   Procedure: TOTAL KNEE ARTHROPLASTY;  Surgeon: Dempsey JINNY Sensor;  Location: [facility] OR;  Service: Orthopedics;  Laterality: Right;  Right Total Knee Arthroplasty   TUBAL LIGATION      Allergies: Iodine, Iohexol , Latex, Penicillins, Lactose, Other, Penicillin g, and Sulfa  antibiotics  Medications: Prior to Admission medications   Medication  Sig Start Date End Date Taking? Authorizing Provider  busPIRone  (BUSPAR ) 10 MG tablet Take 2 tablets (20 mg total) by mouth 2 (two) times daily. 05/03/24 08/01/24  Jesus Bernardino MATSU, MD  celecoxib  (CELEBREX ) 50 MG capsule Take 1 capsule (50 mg total) by mouth 2 (two) times daily. 06/08/24   Jesus Bernardino MATSU, MD  diphenhydrAMINE  (BENADRYL ) 50 MG tablet Take 1 tablet (50 mg total) by mouth once for 1 dose. Patient to take 50 mg of prednisone  on 08/23/2024 at 8:45 pm, 50 mg of prednisone  on 08/24/2024 at 2:45 am, and 50 mg of prednisone  on 08/24/2024 at 8:45 am. Pt is also to take 50 mg of benadryl  on 08/24/2024 at 8:45 am. Please call [phone] with any questions. 08/21/24 08/21/24  Jennefer Ester PARAS, MD  famotidine -calcium  carbonate-magnesium  hydroxide (PEPCID  COMPLETE) [phone] MG chewable tablet CHEW 1 TABLET 2 TIMES DAILYAS NEEDED. 09/29/23   Jesus Bernardino MATSU, MD  HYDROcodone -acetaminophen  (NORCO) 10-325 MG tablet Take 1 tablet by mouth every 8 (eight) hours as needed for up to 20 days for severe pain (pain score 7-10). 08/10/24 08/30/24  Jesus Bernardino MATSU, MD  hydroxychloroquine (PLAQUENIL) 200 MG tablet Take 200 mg by mouth 2 (two) times daily.    [provider]  losartan -hydrochlorothiazide  (HYZAAR) 100-12.5 MG tablet Take 1 tablet by mouth daily. 06/08/24   Vannie Reche RAMAN, NP  metoprolol  succinate (TOPROL -XL) 50 MG 24 hr tablet Take 1 tablet (50 mg total) by mouth daily. 05/01/24   Jesus Bernardino MATSU, MD  Multiple Vitamins-Minerals (EMERGEN-C IMMUNE PLUS PO) Take 1 tablet by mouth daily.    [provider]  predniSONE  (DELTASONE ) 50 MG tablet Patient to take 50 mg of prednisone  on 08/23/2024 at 8:45 pm, 50 mg of prednisone  on 08/24/2024 at 2:45 am, and 50 mg of prednisone  on 08/24/2024 at 8:45 am. Pt is also to take 50 mg of benadryl  on 08/24/2024 at 8:45 am. Please call [phone] with any questions. 08/23/24   Suttle, Ester PARAS, MD  Suzetrigine  (JOURNAVX ) 50 MG TABS Take 1 tablet by mouth 2 (two) times daily as needed. 08/10/24   Jesus Bernardino MATSU, MD     Family History  Problem Relation Age of Onset   Breast cancer Mother 19   Hypertension Mother    Stroke Mother    Depression Mother    Cancer Mother    Heart attack Father    Other Father    Heart disease Father    Sudden death Father    Hypertension Sister    Hypertension Brother    Kidney disease Brother    Other Maternal Grandmother 102       brain tumor dx at 58- did not bx or do additional testing on tumor   Arthritis Maternal Grandmother    Cancer Paternal Grandmother 95       type unk   Breast cancer Maternal Aunt 21   Cancer Paternal Aunt 46       type unk   Cancer Other    Hypertension Other    Hypertension Sister    Anesthesia problems Neg Hx    Hypotension Neg Hx    Malignant hyperthermia Neg Hx    Pseudochol deficiency Neg Hx    Colon cancer Neg Hx    Esophageal cancer Neg Hx    Stomach cancer Neg Hx    Pancreatic cancer Neg Hx    Liver disease Neg Hx     Social History   Socioeconomic History   Marital status:  Married    Spouse name: Not on file   Number of children: 4   Years of education: Not on file   Highest education level: Master's degree (e.g., MA, MS, MEng, MEd, MSW, MBA)  Occupational History   Occupation: Retired Engineer, Civil (consulting)  Tobacco Use   Smoking status: Never   Smokeless tobacco: Never  Vaping Use   Vaping status: Never Used  Substance and Sexual Activity   Alcohol  use: No    Drug use: No   Sexual activity: Yes  Other Topics Concern   Not on file  Social History Narrative   ** Merged History Encounter **       Social Drivers of Health   Financial Resource Strain: Low Risk  (03/09/2024)   Overall Financial Resource Strain (CARDIA)    Difficulty of Paying Living Expenses: Not hard at all  Recent Concern: Financial Resource Strain - Medium Risk (02/07/2024)   Overall Financial Resource Strain (CARDIA)    Difficulty of Paying Living Expenses: Somewhat hard  Food Insecurity: No Food Insecurity (03/09/2024)   Hunger Vital Sign    Worried About Running Out of Food in the Last Year: Never true    Ran Out of Food in the Last Year: Never true  Transportation Needs: No Transportation Needs (03/09/2024)   PRAPARE - Administrator, Civil Service (Medical): No    Lack of Transportation (Non-Medical): No  Physical Activity: Insufficiently Active (03/09/2024)   Exercise Vital Sign    Days of Exercise per Week: 4 days    Minutes of Exercise per Session: 30 min  Stress: No Stress Concern Present (03/09/2024)   [employer] of Occupational Health - Occupational Stress Questionnaire    Feeling of Stress: Not at all  Social Connections: Moderately Integrated (03/09/2024)   Social Connection and Isolation Panel    Frequency of Communication with Friends and Family: More than three times a week    Frequency of Social Gatherings with Friends and Family: Three times a week    Attends Religious Services: More than 4 times per year    Active Member of Clubs or Organizations: Yes    Attends Banker Meetings: 1 to 4 times per year    Marital Status: Separated    Review of Systems: A 12 point ROS discussed and pertinent positives are indicated in the HPI above.  All other systems are negative.  Review of Systems  Musculoskeletal:  Positive for arthralgias, joint swelling and myalgias.       Left knee pain/swelling  All other systems reviewed and are  negative.   Vital Signs: BP (!) 140/80 (BP Location: Left Arm, Patient Position: Sitting, Cuff Size: Normal)   Pulse 65   Temp 98.1 F (36.7 C) (Oral)   Resp 18   SpO2 98%   Physical Exam Constitutional:      General: She is not in acute distress.    Appearance: She is not ill-appearing.  HENT:     Mouth/Throat:     Mouth: Mucous membranes are moist.     Pharynx: Oropharynx is clear.  Cardiovascular:     Rate and Rhythm: Normal rate.  Pulmonary:     Effort: Pulmonary effort is normal.  Abdominal:     Tenderness: There is no abdominal tenderness.  Musculoskeletal:        General: Swelling and tenderness present.     Comments: Left knee  Skin:    General: Skin is warm and dry.  Neurological:  Mental Status: She is alert and oriented to person, place, and time.  Psychiatric:        Mood and Affect: Mood is anxious.     Labs:  CBC: Recent Labs    01/05/24 1459 04/09/24 1149 05/01/24 1236 07/06/24 1228  WBC 3.5* 4.7 4.1 3.5*  HGB 12.3 11.9* 12.3 12.4  HCT 35.8* 35.6* 36.7 37.1  PLT 155 185 205.0 194    COAGS: No results for input(s): INR, APTT in the last 8760 hours.  BMP: Recent Labs    01/05/24 1459 04/09/24 1149 05/01/24 1236 07/06/24 1228  NA 140 139 140 139  K 3.5 3.9 3.7 4.2  CL 105 103 105 105  CO2 30 24 29 29   GLUCOSE 83 94 86 76  BUN 15 8 11 13   CALCIUM  9.6 9.6 9.3 10.2  CREATININE 0.82 0.77 0.67 0.88  GFRNONAA >60 >60  --  >60    LIVER FUNCTION TESTS: Recent Labs    01/05/24 1459 04/09/24 1149 05/01/24 1236 07/06/24 1228  BILITOT 0.5 0.3 0.4 0.5  AST 20 28 15 20   ALT 13 17 15 11   ALKPHOS 85 143* 88 94  PROT 7.2 7.1 6.8 7.3  ALBUMIN  4.1 4.2 3.7 4.2    TUMOR MARKERS: No results for input(s): AFPTM, CEA, CA199, CHROMGRNA in the last 8760 hours.  Assessment and Plan:  Left knee pain: [name], 73 year old female, presents today for an image-guided left geniculate artery embolization. She has an  allergy to contrast and was pre-medicated with the 13 hour steroid prep.   Risks and benefits of procedure were discussed with the patient including, but not limited to bleeding, infection, vascular injury or contrast induced renal failure.    All of the patient's questions were answered, patient is agreeable to proceed. She has been NPO.   Consent signed and in chart.  Thank you for this interesting consult.  I greatly enjoyed meeting [name] and look forward to participating in their care.  A copy of this report was sent to the requesting provider on this date.  Electronically Signed: Warren Dais, AGACNP-BC 08/24/2024, 9:41 AM   I spent a total of  30 Minutes   in face to face in clinical consultation, greater than 50% of which was counseling/coordinating care for left knee pain.

## 2024-08-24 ENCOUNTER — Ambulatory Visit
Admission: RE | Admit: 2024-08-24 | Discharge: 2024-08-24 | Disposition: A | Source: Ambulatory Visit | Attending: Interventional Radiology

## 2024-08-24 DIAGNOSIS — M1712 Unilateral primary osteoarthritis, left knee: Secondary | ICD-10-CM

## 2024-08-24 HISTORY — PX: IR EMBO ARTERIAL NOT HEMORR HEMANG INC GUIDE ROADMAPPING: IMG5448

## 2024-08-24 MED ORDER — KETOROLAC TROMETHAMINE 30 MG/ML IJ SOLN
30.0000 mg | Freq: Once | INTRAMUSCULAR | Status: AC
  Administered 2024-08-24: 30 mg via INTRAVENOUS

## 2024-08-24 MED ORDER — FENTANYL CITRATE (PF) 50 MCG/ML IJ SOSY: 25.0000 ug | PREFILLED_SYRINGE | INTRAMUSCULAR | Status: DC | PRN

## 2024-08-24 MED ORDER — MIDAZOLAM HCL (PF) 2 MG/2ML IJ SOLN: 1.0000 mg | INTRAMUSCULAR | Status: DC | PRN

## 2024-08-24 MED ORDER — NITROGLYCERIN 1 MG/10 ML FOR IR/CATH LAB
100.0000 ug | Freq: Once | INTRA_ARTERIAL | Status: AC
  Administered 2024-08-24: 100 ug via INTRA_ARTERIAL

## 2024-08-24 MED ORDER — LIDOCAINE HCL (PF) 1 % IJ SOLN
10.0000 mL | Freq: Once | INTRAMUSCULAR | Status: AC
  Administered 2024-08-24: 10 mL via INTRADERMAL

## 2024-08-24 MED ORDER — FENTANYL CITRATE (PF) 100 MCG/2ML IJ SOLN
INTRAMUSCULAR | Status: AC | PRN
  Administered 2024-08-24 (×2): 50 ug via INTRAVENOUS

## 2024-08-24 MED ORDER — DEXAMETHASONE SOD PHOSPHATE PF 10 MG/ML IJ SOLN
10.0000 mg | Freq: Once | INTRAMUSCULAR | Status: AC
  Administered 2024-08-24: 10 mg via INTRAVENOUS

## 2024-08-24 MED ORDER — MIDAZOLAM HCL (PF) 2 MG/2ML IJ SOLN
INTRAMUSCULAR | Status: AC | PRN
  Administered 2024-08-24 (×2): 1 mg via INTRAVENOUS

## 2024-08-24 MED ORDER — IIOPAMIDOL (ISOVUE-250) INJECTION 51%
100.0000 mL | Freq: Once | INTRAVENOUS | Status: AC | PRN
  Administered 2024-08-24: 30 mL via INTRA_ARTERIAL

## 2024-08-24 MED ORDER — ACETAMINOPHEN 10 MG/ML IV SOLN
1000.0000 mg | Freq: Once | INTRAVENOUS | Status: AC
  Administered 2024-08-24: 1000 mg via INTRAVENOUS

## 2024-08-24 MED ORDER — SODIUM CHLORIDE 0.9 % IV SOLN: INTRAVENOUS | Status: DC

## 2024-08-24 NOTE — Procedures (Signed)
 Interventional Radiology Procedure Note  Procedure: Left geniculate artery embolization   Findings: Please refer to procedural dictation for full description. 4 Fr proximal left SFA access, manual compression for hemostasis.  Complications: None immediate  Estimated Blood Loss: < 5 mL  Recommendations: IR will arrange 1 month outpatient follow up.   Ester Sides, MD

## 2024-08-27 ENCOUNTER — Telehealth: Payer: Self-pay

## 2024-08-27 NOTE — Progress Notes (Signed)
 Called patient to follow-up with procedure from 08/24/24, states her left thigh is sore, but not at incision site. Denies that there is a lump or hardness present. Pt encouraged to elevate and ice, states that ice helps.   Informed to call [facility] imaging back or seek emergency/urgent care if hardness or lump noted under skin noted, it could be related to bleeding under the skin  Pt states she is unable to take motrin , but has been taking Tylenol 

## 2024-08-30 NOTE — Progress Notes (Addendum)
 This RN noted a couple of missed calls from pt before [facility] opening, returned call.  Pt had questions concerning bruising to thigh area and groin site from procedure on 08/24/24, denies any hardness or swelling, RN informed pt that bruising would heal on own, states her knee still hurts some, but much less than prior to procedure, pt also asked if she could get in water in a couple of weeks, ok to do that as long as groin incision site is healed  This note was written on 08/30/2024 at 1000am

## 2024-09-04 ENCOUNTER — Ambulatory Visit (HOSPITAL_BASED_OUTPATIENT_CLINIC_OR_DEPARTMENT_OTHER): Admitting: Family

## 2024-09-06 ENCOUNTER — Telehealth: Payer: Self-pay

## 2024-09-06 ENCOUNTER — Ambulatory Visit: Payer: Self-pay

## 2024-09-06 NOTE — Telephone Encounter (Signed)
 FYI Only or Action Required?: FYI only for provider: Declines further triage. Pt received call from her radiologists office in the middle of triage stating she needs to go to UC instead of PCP office for imaging.  Patient was last seen in primary care on 08/10/2024 by Jesus Bernardino MATSU, MD.  Called Nurse Triage reporting Bleeding/Bruising (Bruise on left thigh).  Symptoms began several weeks ago.  Interventions attempted: Other: Unable to complete triage.  Symptoms are: Unable to complete triage.  Triage Disposition: Information or Advice Only Call Unable to complete triage  Patient/caregiver understands and will follow disposition?: Unable to complete triage      Patient/caller partially completed triage.  Reason for refusal: Pt received call from her radiologists office in the middle of triage stating she needs to go to UC instead of PCP office for imaging.    Left Knee Surgery 2 weeks ago by radiologist Dr. Bill at a [facility]. Develop a hard bruise on her left thigh a few days later. Called her surgeons office, was told to be seen by PCP to evaluate bruising. While this RN was speaking with pt she got a call from Dr. Elester office who told pt she would need to go to UC to get imaging done that wouldn't be immediately available at her PCP's office. Pt declines further triage/ended call to go to UC.  Copied from CRM #[id number]. Topic: Clinical - Red Word Triage >> Sep 06, 2024 12:51 PM Dedra B wrote: [facility] that prompted transfer to Nurse Triage: Pt had surgery on her L knee two weeks ago and is now having bruising, pain, and burning in L thigh. She was told to follow up with PCP for it. Warm tranfer to NT. Answer Assessment - Initial Assessment Questions 1. APPEARANCE of BRUISE: Describe the bruise.      *No Answer* 2. SIZE: How large is the bruise?      *No Answer* 3. NUMBER: How many bruises are there?      *No Answer* 4. LOCATION: Where is the bruise located?       Left thigh 5. ONSET: How long ago did the bruise occur?      2 weeks ago 6. CAUSE: What do you think caused the bruise?     *No Answer* 7. MEDICAL HISTORY: Do you have any medical problems that can cause easy bruising or bleeding? (e.g., leukemia, liver disease, recent chemotherapy)     *No Answer* 8. MEDICINES: Do you take any medicines which thin the blood such as: aspirin , apixaban, heparin, ibuprofen  (NSAIDS), Plavix, or Coumadin ?     *No Answer* 9. OTHER SYMPTOMS: Do you have any other symptoms?  (e.g., weakness, dizziness, pain, fever, nosebleed, blood in urine/stool)     *No Answer* 10. PREGNANCY: Is there any chance you are pregnant? When was your last menstrual period?       *No Answer*  Protocols used: Bruises-A-AH

## 2024-09-06 NOTE — Telephone Encounter (Signed)
 See telephone note:  For further instruction  This RN spoke with [place], [place] and stated probably best for pt to go to ER r/t being able to have testing done and possible treatment if needed, Ms. Hitchman called back and informed to go to ER/Urgent care, verbal acknowledgement noted

## 2024-09-06 NOTE — Telephone Encounter (Signed)
 Also c/o burning to left thigh area

## 2024-09-06 NOTE — Telephone Encounter (Signed)
 FYI

## 2024-09-12 ENCOUNTER — Other Ambulatory Visit: Payer: Self-pay | Admitting: Internal Medicine

## 2024-09-12 DIAGNOSIS — R1084 Generalized abdominal pain: Secondary | ICD-10-CM

## 2024-09-12 DIAGNOSIS — R1010 Upper abdominal pain, unspecified: Secondary | ICD-10-CM

## 2024-09-18 ENCOUNTER — Encounter: Payer: Self-pay | Admitting: Interventional Radiology

## 2024-09-19 ENCOUNTER — Other Ambulatory Visit: Payer: Self-pay | Admitting: Interventional Radiology

## 2024-09-19 DIAGNOSIS — M1712 Unilateral primary osteoarthritis, left knee: Secondary | ICD-10-CM

## 2024-09-20 NOTE — Progress Notes (Signed)
 "  Referring Physician(s): Marchwiany,Daniel A   Chief Complaint: The patient is seen in follow up today s/p left geniculate artery embolization 08/24/24.   History of present illness: HPI from initial consultation 07/09/24 [name] is a 74 y.o. female with a medical history significant for right breast cancer, HTN, anxiety/depression, diverticulitis, rheumatoid arthritis and lupus. She also has a history of left knee pain and is s/p left knee arthroplasty in 2006 followed by left and right total knee arthroplasties in 2012. She has had significant left knee pain ever since her surgery.    She was recently seen by her PCP for evaluation of her left knee pain which interferes with her sleep and has also likely contributed to her blood pressure being elevated. She is taking celebrex  several times daily and hydrocodone  as needed. A synovial fluid analysis of the left knee showed inflammation but no infection. A bone scan was also obtained and this indicated increased blood flow and possible autoimmune attack.    Due to her autoimmune conditions she would be at increased risk for complications with another knee surgery. The patient has been referred to Interventional Radiology to explore geniculate artery embolization as a possible treatment option.    [place] Pain Score = 75/96 VAS Pain Score = 10/10   She was considered an excellent candidate for geniculate artery embolization and we  discussed the rationale, periprocedural expectations, and long term expected outcomes after geniculate artery embolization. She was amenable and ready to proceed. She presented to the IR outpatient clinic 08/24/24 and underwent a technically successful left geniculate artery embolization.   She tolerated the procedure well and she presents to the clinic today for follow up. Her WOMAC score has significantly improved to 22/96.  Her medial, distal thigh bruising has nearly resolved and remained otherwise  asymptomatic.  No issues with access site.    Past Medical History:  Diagnosis Date   Acute pain of right shoulder 11/01/2022   Allergy 1967   Anxiety    Aortic atherosclerosis 11/01/2022   On 2022 CT abdomen.   Arthritis    R knee, hands    Back pain    Blood transfusion    1986- post-partial hysterectomy   Blood transfusion without reported diagnosis    Breast cancer (HCC) 2009   right lumpectomy and radiation   Cataract 12/2023   both eyes   Chicken pox    Colon cancer (HCC) 2003   per pt report, cancer found in a colon polyp in 2003, procedure done in [place].  did not require surgery, chemo etc.     Depression    Difficulty standing 09/08/2022   Due to hip pain mainly- she agreed to physical therapy if insurance will cover   Diverticular disease 11/01/2022   On 2022 CT abd   Diverticulitis    Edema of both ankles    Elevated amylase 11/22/2022   Amylase                 Component    Value    Date/Time           AMYLASE    57    02/07/2023 1054        Family history of breast cancer 10/24/2017   Flank pain 11/03/2007   Urgent care told UTI responsible but she reports she didn't have one.  History of kidney stones and kind of feels like that  Started 3 weeks ago  Gallbladder problem    Gastric wall thickening 12/15/2022   11/2022 CT abdomen: Focal nodular wall thickening is seen at the GE junction and gastric fundus and gastric carcinoma cannot be excluded. Remainder of the stomach is unremarkable. No evidence of  obstruction, inflammatory process or abnormal fluid collections.  Diverticulosis is seen mainly involving the descending and sigmoid colon, however there is no evidence of diverticulitis.     Had esophagod   Gastritis    Generalized abdominal pain 12/15/2022   GERD (gastroesophageal reflux disease)    recently taken off anti reflux tx   Heart murmur 1954   Hepatitis    Hep. A- 1978   Hiatal hernia    History of stomach ulcers    Hypertension    Insomnia  02/17/2021   Flared with seeing sister summer 2024  Difficulty falling asleep and waking up  She snores but not aware of apnea-never had sleep study  She hasn't tried medication(s)  Getting about 4 hours nightly  No caffeine relationship  Doesn't seem related with arthritis  Is associated with racing thoughts.  Music and bible helps.         Intractable abdominal pain 12/15/2022   01/13/2023 interim history:   carafate , bentyl  (price ineffective), glycopyrrolate  (dry mouth) all stopped  Maalox helps, would like to dc proton pump inhibitor (PPI) stomach acid reducer.      Prior history  IMPRESSION:  Focal nodular wall thickening at the GE junction and gastric fundus.  Gastric carcinoma cannot be excluded. Consider upper endoscopy or  upper GI series for further evaluation.      Joint pain    Lactose intolerance    Lower extremity edema 01/11/2022   Lupus    Multiple gastric ulcers    Palpitations    Personal history of radiation therapy 2009   Prediabetes 09/08/2022   Lab Results Component Value Date  HGBA1C 5.7 (H) 06/29/2021     Rheumatic fever    Rheumatoid arthritis (HCC)    SLE (systemic lupus erythematosus related syndrome) (HCC)    Upper abdominal pain 11/01/2022   Radiates to the back and lower abdomen.has been present since late 2023 and we do not have a CAT scan yet for it.  X-ray abdomen showed only degenerative disc disease and chronic constipation.  Has visit planned with gastrointestinal specialist 01/04/23     Has been taking bentyl  and hydrocodone  and it helps  Has not been taking prescribed omeprazole  and carafate  due to pharmacist misunderstanding    Urinary incontinence    UTI (urinary tract infection)     Past Surgical History:  Procedure Laterality Date   ABDOMINAL HYSTERECTOMY     1986   APPENDECTOMY     1978 & tubal ligation    BIOPSY  07/25/2018   Procedure: BIOPSY;  Surgeon: Teressa Toribio SQUIBB, MD;  Location: [facility] ENDOSCOPY;  Service: Endoscopy;;   BREAST EXCISIONAL  BIOPSY Left 1998   BREAST LUMPECTOMY Bilateral    2009   BREAST SURGERY     R breast lumpectomy   CHOLECYSTECTOMY     1978- open    ESOPHAGOGASTRODUODENOSCOPY (EGD) WITH PROPOFOL  N/A 07/25/2018   Procedure: ESOPHAGOGASTRODUODENOSCOPY (EGD) WITH PROPOFOL ;  Surgeon: Teressa Toribio SQUIBB, MD;  Location: [facility] ENDOSCOPY;  Service: Endoscopy;  Laterality: N/A;   IR EMBO ARTERIAL NOT HEMORR HEMANG INC GUIDE ROADMAPPING  08/24/2024   IR RADIOLOGIST EVAL & MGMT  07/09/2024   JOINT REPLACEMENT  2006, 2013   REPLACEMENT TOTAL KNEE Left  TONSILLECTOMY     1954   TOTAL KNEE ARTHROPLASTY  08/30/2011   Procedure: TOTAL KNEE ARTHROPLASTY;  Surgeon: Dempsey JINNY Sensor;  Location: [facility] OR;  Service: Orthopedics;  Laterality: Right;  Right Total Knee Arthroplasty   TUBAL LIGATION      Allergies: Iodine, Iohexol , Latex, Penicillins, Lactose, Other, Penicillin g, and Sulfa  antibiotics  Medications: Prior to Admission medications  Medication Sig Start Date End Date Taking? Authorizing Provider  busPIRone  (BUSPAR ) 10 MG tablet Take 2 tablets (20 mg total) by mouth 2 (two) times daily. 05/03/24 08/01/24  Jesus Bernardino MATSU, MD  celecoxib  (CELEBREX ) 50 MG capsule Take 1 capsule (50 mg total) by mouth 2 (two) times daily. 06/08/24   Jesus Bernardino MATSU, MD  diphenhydrAMINE  (BENADRYL ) 50 MG tablet Take 1 tablet (50 mg total) by mouth once for 1 dose. Patient to take 50 mg of prednisone  on 08/23/2024 at 8:45 pm, 50 mg of prednisone  on 08/24/2024 at 2:45 am, and 50 mg of prednisone  on 08/24/2024 at 8:45 am. Pt is also to take 50 mg of benadryl  on 08/24/2024 at 8:45 am. Please call [phone] with any questions. 08/21/24 08/21/24  Jennefer Ester JINNY, MD  famotidine -calcium  carbonate-magnesium  hydroxide (PEPCID  COMPLETE) 10-800-165 MG chewable tablet CHEW 1 TABLET 2 TIMES DAILYAS NEEDED. 09/29/23   Jesus Bernardino MATSU, MD  HYDROcodone -acetaminophen  (NORCO) 10-325 MG tablet Take 1 tablet by mouth every 8 (eight) hours as needed for up to 20 days  for severe pain (pain score 7-10). 08/10/24 08/30/24  Jesus Bernardino MATSU, MD  hydroxychloroquine (PLAQUENIL) 200 MG tablet Take 200 mg by mouth 2 (two) times daily.    [provider]  losartan -hydrochlorothiazide  (HYZAAR) 100-12.5 MG tablet Take 1 tablet by mouth daily. 06/08/24   Vannie Reche RAMAN, NP  metoprolol  succinate (TOPROL -XL) 50 MG 24 hr tablet Take 1 tablet (50 mg total) by mouth daily. 05/01/24   Jesus Bernardino MATSU, MD  Multiple Vitamins-Minerals (EMERGEN-C IMMUNE PLUS PO) Take 1 tablet by mouth daily.    [provider]  omeprazole  (PRILOSEC) 40 MG capsule TAKE 1 CAPSULE DAILY 09/13/24   Jesus Bernardino MATSU, MD  predniSONE  (DELTASONE ) 50 MG tablet Patient to take 50 mg of prednisone  on 08/23/2024 at 8:45 pm, 50 mg of prednisone  on 08/24/2024 at 2:45 am, and 50 mg of prednisone  on 08/24/2024 at 8:45 am. Pt is also to take 50 mg of benadryl  on 08/24/2024 at 8:45 am. Please call [phone] with any questions. 08/23/24   Makani Seckman, Ester JINNY, MD  Suzetrigine  (JOURNAVX ) 50 MG TABS Take 1 tablet by mouth 2 (two) times daily as needed. 08/10/24   Jesus Bernardino MATSU, MD     Family History  Problem Relation Age of Onset   Breast cancer Mother 64   Hypertension Mother    Stroke Mother    Depression Mother    Cancer Mother    Heart attack Father    Other Father    Heart disease Father    Sudden death Father    Hypertension Sister    Hypertension Brother    Kidney disease Brother    Other Maternal Grandmother 102       brain tumor dx at 47- did not bx or do additional testing on tumor   Arthritis Maternal Grandmother    Cancer Paternal Grandmother 61       type unk   Breast cancer Maternal Aunt 61   Cancer Paternal Aunt 66       type unk   Cancer Other  Hypertension Other    Hypertension Sister    Anesthesia problems Neg Hx    Hypotension Neg Hx    Malignant hyperthermia Neg Hx    Pseudochol deficiency Neg Hx    Colon cancer Neg Hx    Esophageal cancer Neg Hx    Stomach  cancer Neg Hx    Pancreatic cancer Neg Hx    Liver disease Neg Hx     Social History   Socioeconomic History   Marital status: Married    Spouse name: Not on file   Number of children: 4   Years of education: Not on file   Highest education level: Master's degree (e.g., MA, MS, MEng, MEd, MSW, MBA)  Occupational History   Occupation: Retired Engineer, Civil (consulting)  Tobacco Use   Smoking status: Never   Smokeless tobacco: Never  Vaping Use   Vaping status: Never Used  Substance and Sexual Activity   Alcohol  use: No   Drug use: No   Sexual activity: Yes  Other Topics Concern   Not on file  Social History Narrative   ** Merged History Encounter **       Social Drivers of Health   Tobacco Use: Low Risk (06/26/2024)   Patient History    Smoking Tobacco Use: Never    Smokeless Tobacco Use: Never    Passive Exposure: Not on file  Financial Resource Strain: Low Risk (03/09/2024)   Overall Financial Resource Strain (CARDIA)    Difficulty of Paying Living Expenses: Not hard at all  Recent Concern: Financial Resource Strain - Medium Risk (02/07/2024)   Overall Financial Resource Strain (CARDIA)    Difficulty of Paying Living Expenses: Somewhat hard  Food Insecurity: No Food Insecurity (03/09/2024)   Epic    Worried About Radiation Protection Practitioner of Food in the Last Year: Never true    Ran Out of Food in the Last Year: Never true  Transportation Needs: No Transportation Needs (03/09/2024)   Epic    Lack of Transportation (Medical): No    Lack of Transportation (Non-Medical): No  Physical Activity: Insufficiently Active (03/09/2024)   Exercise Vital Sign    Days of Exercise per Week: 4 days    Minutes of Exercise per Session: 30 min  Stress: No Stress Concern Present (03/09/2024)   [employer] of Occupational Health - Occupational Stress Questionnaire    Feeling of Stress: Not at all  Social Connections: Moderately Integrated (03/09/2024)   Social Connection and Isolation Panel    Frequency of  Communication with Friends and Family: More than three times a week    Frequency of Social Gatherings with Friends and Family: Three times a week    Attends Religious Services: More than 4 times per year    Active Member of Clubs or Organizations: Yes    Attends Banker Meetings: 1 to 4 times per year    Marital Status: Separated  Depression (PHQ2-9): Low Risk (06/26/2024)   Depression (PHQ2-9)    PHQ-2 Score: 0  Alcohol  Screen: Low Risk (03/09/2024)   Alcohol  Screen    Last Alcohol  Screening Score (AUDIT): 1  Housing: Low Risk (03/09/2024)   Epic    Unable to Pay for Housing in the Last Year: No    Number of Times Moved in the Last Year: 0    Homeless in the Last Year: No  Utilities: Not At Risk (01/24/2024)   AHC Utilities    Threatened with loss of utilities: No  Health Literacy: Adequate Health Literacy (01/24/2024)  B1300 Health Literacy    Frequency of need for help with medical instructions: Never     Vital Signs: There were no vitals taken for this visit.  Physical Exam  Imaging:  NM Bone Scan 06/07/24      CT left knee 04/21/24        Left knee X-ray 04/21/24           Left GAE 08/24/24      Labs:  CBC: Recent Labs    01/05/24 1459 04/09/24 1149 05/01/24 1236 07/06/24 1228  WBC 3.5* 4.7 4.1 3.5*  HGB 12.3 11.9* 12.3 12.4  HCT 35.8* 35.6* 36.7 37.1  PLT 155 185 205.0 194    COAGS: No results for input(s): INR, APTT in the last 8760 hours.  BMP: Recent Labs    01/05/24 1459 04/09/24 1149 05/01/24 1236 07/06/24 1228  NA 140 139 140 139  K 3.5 3.9 3.7 4.2  CL 105 103 105 105  CO2 30 24 29 29   GLUCOSE 83 94 86 76  BUN 15 8 11 13   CALCIUM  9.6 9.6 9.3 10.2  CREATININE 0.82 0.77 0.67 0.88  GFRNONAA >60 >60  --  >60    LIVER FUNCTION TESTS: Recent Labs    01/05/24 1459 04/09/24 1149 05/01/24 1236 07/06/24 1228  BILITOT 0.5 0.3 0.4 0.5  AST 20 28 15 20   ALT 13 17 15 11   ALKPHOS 85 143* 88 94  PROT 7.2 7.1 6.8  7.3  ALBUMIN  4.1 4.2 3.7 4.2    Assessment and Plan: 74 year old female with a history of bilateral knee pain status post bilateral knee arthroplasties in 2012. She has had persistent, lifestyle limiting pain in her left knee since her surgery. She also has a history of lupus and rheumatoid arthritis. Recent bone scan demonstrated significant synovial hyperemia about the left knee, concordant with her pain. She underwent a technically successful left geniculate artery embolization 08/24/24. Her WOMAC score has decreased to a 22/96 from a 75/96 pre-procedure. She is very pleased with the results.  Plan for 3 month IR clinic follow up.  Ester Sides, MD Pager: [phone]    I spent a total of  in face to face in clinical consultation, greater than 50% of which was counseling/coordinating care for left knee pain.      "

## 2024-09-21 ENCOUNTER — Ambulatory Visit: Admission: RE | Admit: 2024-09-21 | Source: Ambulatory Visit

## 2024-09-21 DIAGNOSIS — M1712 Unilateral primary osteoarthritis, left knee: Secondary | ICD-10-CM

## 2024-09-21 HISTORY — PX: IR RADIOLOGIST EVAL & MGMT: IMG5224

## 2024-09-24 ENCOUNTER — Ambulatory Visit: Payer: Self-pay

## 2024-09-24 ENCOUNTER — Encounter: Payer: Self-pay | Admitting: Internal Medicine

## 2024-09-24 NOTE — Telephone Encounter (Signed)
 Message from [place] T sent at 09/24/2024 11:53 AM EST  Reason for Triage: fever 101, cough, yellow phlegm, headaches, body aches- [phone]

## 2024-09-24 NOTE — Telephone Encounter (Signed)
 FYI Only or Action Required?: FYI only for provider: appointment scheduled on 09/26/24.  Patient was last seen in primary care on 08/10/2024 by Jesus Bernardino MATSU, MD.  Called Nurse Triage reporting URI.  Symptoms began yesterday.  Interventions attempted: OTC medications: Tylenol .  Symptoms are: gradually worsening.  Triage Disposition: See HCP Within 4 Hours (Or PCP Triage)  Patient/caregiver understands and will follow disposition?: Yes, but will wait  Reason for Disposition  [1] Fever > 101 F (38.3 C) AND [2] age > 60 years  Answer Assessment - Initial Assessment Questions Pt calling to report cold symptoms - onset 09/23/24. Temperature 101, congestion, sneezing, cough yellow phlegm, muscle aches. Pt taking tylenol  to help with fever.   Writer advised UC since no sooner appt. Patient is scheduled with PCP 09/26/24 and has been messaging her PCP through myChart and was given recommendations for symptom management.    1. ONSET: When did the nasal discharge start?      09/23/24 2. AMOUNT: How much discharge is there?      N/a  3. COUGH: Do you have a cough? If Yes, ask: Describe the color of your mucus. (e.g., clear, white, yellow, green)     Yes  4. RESPIRATORY DISTRESS: Describe your breathing.      Difficult to breathe d/t congestion  5. FEVER: Do you have a fever? If Yes, ask: What is your temperature, how was it measured, and when did it start?     Yes - 101  6. SEVERITY: Overall, how bad are you feeling right now? (e.g., doesn't interfere with normal activities, staying home from school/work, staying in bed)      Feeling bad  7. OTHER SYMPTOMS: Do you have any other symptoms? (e.g., earache, mouth sores, sore throat, wheezing)     N/a  Protocols used: Common Cold-A-AH

## 2024-09-25 NOTE — Telephone Encounter (Signed)
 Noted, appt tomorrow

## 2024-09-26 ENCOUNTER — Encounter: Payer: Self-pay | Admitting: Internal Medicine

## 2024-09-26 ENCOUNTER — Ambulatory Visit: Payer: Self-pay | Admitting: Internal Medicine

## 2024-09-26 ENCOUNTER — Ambulatory Visit: Admitting: Internal Medicine

## 2024-09-26 VITALS — BP 118/68 | HR 66 | Temp 98.0°F | Resp 14 | Ht 67.0 in | Wt 181.4 lb

## 2024-09-26 DIAGNOSIS — J019 Acute sinusitis, unspecified: Secondary | ICD-10-CM | POA: Diagnosis not present

## 2024-09-26 DIAGNOSIS — E860 Dehydration: Secondary | ICD-10-CM | POA: Diagnosis not present

## 2024-09-26 DIAGNOSIS — B349 Viral infection, unspecified: Secondary | ICD-10-CM

## 2024-09-26 DIAGNOSIS — R350 Frequency of micturition: Secondary | ICD-10-CM

## 2024-09-26 LAB — CBC WITH DIFFERENTIAL/PLATELET
Basophils Absolute: 0 K/uL (ref 0.0–0.1)
Basophils Relative: 0.4 % (ref 0.0–3.0)
Eosinophils Absolute: 0.2 K/uL (ref 0.0–0.7)
Eosinophils Relative: 4 % (ref 0.0–5.0)
HCT: 37.8 % (ref 36.0–46.0)
Hemoglobin: 12.7 g/dL (ref 12.0–15.0)
Lymphocytes Relative: 25.5 % (ref 12.0–46.0)
Lymphs Abs: 1.3 K/uL (ref 0.7–4.0)
MCHC: 33.7 g/dL (ref 30.0–36.0)
MCV: 93.3 fl (ref 78.0–100.0)
Monocytes Absolute: 0.9 K/uL (ref 0.1–1.0)
Monocytes Relative: 18 % — ABNORMAL HIGH (ref 3.0–12.0)
Neutro Abs: 2.7 K/uL (ref 1.4–7.7)
Neutrophils Relative %: 52.1 % (ref 43.0–77.0)
Platelets: 196 K/uL (ref 150.0–400.0)
RBC: 4.06 Mil/uL (ref 3.87–5.11)
RDW: 13.8 % (ref 11.5–15.5)
WBC: 5.2 K/uL (ref 4.0–10.5)

## 2024-09-26 LAB — COMPREHENSIVE METABOLIC PANEL WITH GFR
ALT: 13 U/L (ref 3–35)
AST: 20 U/L (ref 5–37)
Albumin: 4 g/dL (ref 3.5–5.2)
Alkaline Phosphatase: 80 U/L (ref 39–117)
BUN: 17 mg/dL (ref 6–23)
CO2: 27 meq/L (ref 19–32)
Calcium: 9.5 mg/dL (ref 8.4–10.5)
Chloride: 98 meq/L (ref 96–112)
Creatinine, Ser: 1.05 mg/dL (ref 0.40–1.20)
GFR: 51.94 mL/min — ABNORMAL LOW
Glucose, Bld: 84 mg/dL (ref 70–99)
Potassium: 3.3 meq/L — ABNORMAL LOW (ref 3.5–5.1)
Sodium: 132 meq/L — ABNORMAL LOW (ref 135–145)
Total Bilirubin: 0.4 mg/dL (ref 0.2–1.2)
Total Protein: 7.1 g/dL (ref 6.0–8.3)

## 2024-09-26 LAB — POCT INFLUENZA A/B
Influenza A, POC: NEGATIVE
Influenza B, POC: NEGATIVE

## 2024-09-26 LAB — CK: Total CK: 91 U/L (ref 17–177)

## 2024-09-26 MED ORDER — BENZONATATE 100 MG PO CAPS: 100.0000 mg | ORAL_CAPSULE | Freq: Two times a day (BID) | ORAL | 0 refills | Status: AC | PRN

## 2024-09-26 MED ORDER — DOXYCYCLINE HYCLATE 100 MG PO TABS: 100.0000 mg | ORAL_TABLET | Freq: Two times a day (BID) | ORAL | 0 refills | Status: AC

## 2024-09-26 NOTE — Patient Instructions (Signed)
 It was a pleasure seeing you today! Your health and satisfaction are our top priorities.  Bernardino Cone, MD  VISIT SUMMARY: Today, you were seen for flu-like symptoms that have been present for about two and a half days. You reported fever, phlegm in the throat, chest pain, body aches, nasal congestion, and difficulty sleeping. Your fever has resolved, but you continue to experience significant nasal congestion and coughing. You have been taking acetaminophen  for body pains and mentioned using a medication called 'benoxinate' for relief. You also take losartan , hydrochlorothiazide , and Celebrex  for other health conditions.  YOUR PLAN: -ACUTE VIRAL SYNDROME: You have symptoms of an acute viral infection, which include fever, phlegm, chest pain, body aches, and nasal congestion. This is likely caused by a virus, and while it is more severe than a common cold, it is not definitively the flu. You have been prescribed Tamiflu  to help with the symptoms and Tessalon  to manage your cough. You should also do sinus rinses at bedtime and switch from Nyquil to Robitussin for better cough relief. Blood work has been ordered to check your electrolytes and kidney function. Make sure to stay hydrated and get plenty of rest.  -DEHYDRATION: Dehydration occurs when your body loses more fluids than it takes in, which can happen due to fever and not drinking enough fluids. This can cause low energy, body aches, and low blood pressure. You should increase your fluid intake with drinks that contain electrolytes, such as Gatorade, Pedialyte, broth, or liquid IV. Popsicles can also help with hydration. You should hold or reduce the dose of your losartan -hydrochlorothiazide  until your symptoms improve. Blood work has been ordered to check your electrolyte levels and kidney function.  INSTRUCTIONS: Please follow up with the blood work as ordered to assess your electrolytes and kidney function. Continue to monitor your symptoms and  stay hydrated. If your symptoms worsen or do not improve, please contact our office.  Your Providers PCP: Cone Bernardino MATSU, MD,  [phone]) Referring Provider: Cone Bernardino MATSU, MD,  [phone]) Care Team Provider: Ishmael Slough, MD,  [phone]) Care Team Provider: Kerman Vina HERO, NP,  [phone]) Care Team Provider: Murrell Drivers, MD,  [phone]) Care Team Provider: Raford Riggs, MD,  [phone]) Care Team Provider: Loretha Ash, MD,  [phone]) Care Team Provider: Aneita Gwendlyn DASEN, MD  NEXT STEPS: [x]  Early Intervention: Schedule sooner appointment, call our on-call services, or go to emergency room if there is any significant Increase in pain or discomfort New or worsening symptoms Sudden or severe changes in your health [x]  Flexible Follow-Up: We recommend a No follow-ups on file. for optimal routine care. This allows for progress monitoring and treatment adjustments. [x]  Preventive Care: Schedule your annual preventive care visit! It's typically covered by insurance and helps identify potential health issues early. [x]  Lab & X-ray Appointments: Incomplete tests scheduled today, or call to schedule. X-rays: [facility] (M-F, 8:30am-noon or 1pm-5pm). [x]  Medical Information Release: Sign a release form at front desk to obtain relevant medical information we don't have.  MAKING THE MOST OF OUR FOCUSED 20 MINUTE APPOINTMENTS: [x]   Clearly state your top concerns at the beginning of the visit to focus our discussion [x]   If you anticipate you will need more time, please inform the front desk during scheduling - we can book multiple appointments in the same week. [x]   If you have transportation problems- use our convenient video appointments or ask about transportation support. [x]   We can get down to business faster if  you use MyChart to update information before the visit and submit non-urgent questions before your visit. Thank you for  taking the time to provide details through MyChart.  Let our nurse know and she can import this information into your encounter documents.  Arrival and Wait Times: [x]   Arriving on time ensures that everyone receives prompt attention. [x]   Early morning (8a) and afternoon (1p) appointments tend to have shortest wait times. [x]   Unfortunately, we cannot delay appointments for late arrivals or hold slots during phone calls.  Getting Answers and Following Up [x]   Simple Questions & Concerns: For quick questions or basic follow-up after your visit, reach us  at [phone] or MyChart messaging. [x]   Complex Concerns: If your concern is more complex, scheduling an appointment might be best. Discuss this with the staff to find the most suitable option. [x]   Lab & Imaging Results: We'll contact you directly if results are abnormal or you don't use MyChart. Most normal results will be on MyChart within 2-3 business days, with a review message from Dr. Jesus. Haven't heard back in 2 weeks? Need results sooner? Contact us  at [phone]. [x]   Referrals: Our referral coordinator will manage specialist referrals. The specialist's office should contact you within 2 weeks to schedule an appointment. Call us  if you haven't heard from them after 2 weeks.  Staying Connected [x]   MyChart: Activate your MyChart for the fastest way to access results and message us . See the last page of this paperwork for instructions on how to activate.  Bring to Your Next Appointment [x]   Medications: Please bring all your medication bottles to your next appointment to ensure we have an accurate record of your prescriptions. [x]   Health Diaries: If you're monitoring any health conditions at home, keeping a diary of your readings can be very helpful for discussions at your next appointment.  Billing [x]   X-ray & Lab Orders: These are billed by separate companies. Contact the invoicing company directly for questions or  concerns. [x]   Visit Charges: Discuss any billing inquiries with our administrative services team.  Your Satisfaction Matters [x]   Share Your Experience: We strive for your satisfaction! If you have any complaints, or preferably compliments, please let Dr. Jesus know directly or contact our Practice Administrators, Manuelita Rubin or [employer], by asking at the front desk.   Reviewing Your Records [x]   Review this early draft of your clinical encounter notes below and the final encounter summary tomorrow on MyChart after its been completed.  All orders placed so far are visible here: Urinary frequency -     Doxycycline  Hyclate; Take 1 tablet (100 mg total) by mouth 2 (two) times daily.  Dispense: 20 tablet; Refill: 0  Acute viral disease -     POCT Influenza A/B  Acute sinusitis, recurrence not specified, unspecified location -     Doxycycline  Hyclate; Take 1 tablet (100 mg total) by mouth 2 (two) times daily.  Dispense: 20 tablet; Refill: 0 -     Benzonatate ; Take 1 capsule (100 mg total) by mouth 2 (two) times daily as needed for cough.  Dispense: 20 capsule; Refill: 0  Dehydration -     Urinalysis w microscopic + reflex cultur -     CBC with Differential/Platelet -     Comprehensive metabolic panel with GFR -     CK

## 2024-09-26 NOTE — Progress Notes (Signed)
 ==============================  [facility]: [phone]   -- Medical Office Visit --  Patient: KARENA KINKER      Age: 74 y.o.       Sex:  female  Date:   09/26/2024 Today's Healthcare Provider: Bernardino KANDICE Cone, MD  ==============================   Chief Complaint: Influenza (Expressed that she thinks she has the flu. Expressed that her symptoms began Monday. ), Follow-up (No questions or concerns at this time.), and Urinary Tract Infection (Expressed that she thinks she has a UTI due to excessive urination. )  Discussed the use of AI scribe software for clinical note transcription with the patient, who gave verbal consent to proceed.  History of Present Illness JODECI RINI is a 74 year old female who presents with flu-like symptoms.  She has been experiencing flu-like symptoms for approximately two and a half days, including fever, phlegm in the throat, chest pain, and body aches localized to the shoulders and chest. The fever reached 100.64F and has since resolved as of yesterday.  She describes significant nasal congestion and coughing, with phlegm being a major issue. No ear pain, but there is irritation in the nose. She reports difficulty sleeping, getting only about an hour at a time, and feels generally fatigued.  She has been taking acetaminophen  for body pains and mentions using a medication she refers to as 'benoxinate' for relief, which she found helpful. She also takes losartan  100 mg and hydrochlorothiazide  for blood pressure management, and Celebrex  for pain. Background Reviewed: Problem List: has Malignant neoplasm of upper-outer quadrant of right breast in female, estrogen receptor positive (HCC); HTN (hypertension); Hiatal hernia; Systemic lupus erythematosus (HCC); Hypokalemia; Personal history of colon cancer; History of breast cancer; Family history of coronary artery disease in father; Hyperlipidemia with target LDL less  than 130; LVH (left ventricular hypertrophy) due to hypertensive disease, without heart failure; Weight disorder; Primary localized osteoarthrosis of multiple sites; Rheumatoid arthritis (HCC); GERD (gastroesophageal reflux disease); Difficulty standing; Hand arthritis; Aortic atherosclerosis; Diverticular disease; Chronic idiopathic constipation; DDD (degenerative disc disease), thoracic; Abnormal finding on imaging of liver; Chronic kidney disease, stage 2 (mild); Diastolic dysfunction; NSVT (nonsustained ventricular tachycardia) (HCC); Adjustment disorder; Chronic pain of left knee; Other specified anxiety disorders; Other microscopic hematuria; Neutropenia; Proteinuria; Frequent urination; Other fatigue; Intractable pain; Arthritis of knee; and Hemarthrosis of left knee on their problem list. Past Medical History:  has a past medical history of Acute pain of right shoulder (11/01/2022), Allergy (1967), Anxiety, Aortic atherosclerosis (11/01/2022), Arthritis, Back pain, Blood transfusion, Blood transfusion without reported diagnosis, Breast cancer (HCC) (2009), Cataract (12/2023), Chicken pox, Colon cancer (HCC) (2003), Depression, Difficulty standing (09/08/2022), Diverticular disease (11/01/2022), Diverticulitis, Edema of both ankles, Elevated amylase (11/22/2022), Family history of breast cancer (10/24/2017), Flank pain (11/03/2007), Gallbladder problem, Gastric wall thickening (12/15/2022), Gastritis, Generalized abdominal pain (12/15/2022), GERD (gastroesophageal reflux disease), Heart murmur (1954), Hepatitis, Hiatal hernia, History of stomach ulcers, Hypertension, Insomnia (02/17/2021), Intractable abdominal pain (12/15/2022), Joint pain, Lactose intolerance, Lower extremity edema (01/11/2022), Lupus, Multiple gastric ulcers, Palpitations, Personal history of radiation therapy (2009), Prediabetes (09/08/2022), Rheumatic fever, Rheumatoid arthritis (HCC), SLE (systemic lupus erythematosus related syndrome)  (HCC), Upper abdominal pain (11/01/2022), Urinary incontinence, and UTI (urinary tract infection). Past Surgical History:   has a past surgical history that includes Tonsillectomy; Appendectomy; Abdominal hysterectomy; Tubal ligation; Breast surgery; Cholecystectomy; Total knee arthroplasty (08/30/2011); Replacement total knee (Left); Esophagogastroduodenoscopy (egd) with propofol  (N/A, 07/25/2018); biopsy (07/25/2018); Breast excisional biopsy (Left, 1998); Breast lumpectomy (Bilateral);  Joint replacement (2006, 2013); IR Radiologist Eval & Mgmt (07/09/2024); IR EMBO ARTERIAL NOT HEMORR HEMANG INC GUIDE ROADMAPPING (08/24/2024); and IR Radiologist Eval & Mgmt (09/21/2024). Social History:   reports that she has never smoked. She has been exposed to tobacco smoke. She has never used smokeless tobacco. She reports that she does not drink alcohol  and does not use drugs. Family History:  family history includes Arthritis in her maternal grandmother; Breast cancer (age of onset: 66) in her maternal aunt; Breast cancer (age of onset: 54) in her mother; Cancer in her mother and another family member; Cancer (age of onset: 51) in her paternal aunt; Cancer (age of onset: 81) in her paternal grandmother; Depression in her mother; Heart attack in her father; Heart disease in her father; Hypertension in her brother, mother, sister, sister, and another family member; Kidney disease in her brother; Other in her father; Other (age of onset: 61) in her maternal grandmother; Stroke in her mother; Sudden death in her father. Allergies:  is allergic to iodine, iohexol , latex, penicillins, lactose, penicillin g, and sulfa  antibiotics.   Medication Reconciliation: Current Outpatient Medications on File Prior to Visit  Medication Sig   busPIRone  (BUSPAR ) 10 MG tablet Take 2 tablets (20 mg total) by mouth 2 (two) times daily.   celecoxib  (CELEBREX ) 50 MG capsule Take 1 capsule (50 mg total) by mouth 2 (two) times daily.    diphenhydrAMINE  (BENADRYL ) 50 MG tablet Take 1 tablet (50 mg total) by mouth once for 1 dose. Patient to take 50 mg of prednisone  on 08/23/2024 at 8:45 pm, 50 mg of prednisone  on 08/24/2024 at 2:45 am, and 50 mg of prednisone  on 08/24/2024 at 8:45 am. Pt is also to take 50 mg of benadryl  on 08/24/2024 at 8:45 am. Please [phone] with any questions.   famotidine -calcium  carbonate-magnesium  hydroxide (PEPCID  COMPLETE) [phone] MG chewable tablet CHEW 1 TABLET 2 TIMES DAILYAS NEEDED.   hydroxychloroquine (PLAQUENIL) 200 MG tablet Take 200 mg by mouth 2 (two) times daily.   losartan -hydrochlorothiazide  (HYZAAR) 100-12.5 MG tablet Take 1 tablet by mouth daily.   metoprolol  succinate (TOPROL -XL) 50 MG 24 hr tablet Take 1 tablet (50 mg total) by mouth daily.   Multiple Vitamins-Minerals (EMERGEN-C IMMUNE PLUS PO) Take 1 tablet by mouth daily.   omeprazole  (PRILOSEC) 40 MG capsule TAKE 1 CAPSULE DAILY   Suzetrigine  (JOURNAVX ) 50 MG TABS Take 1 tablet by mouth 2 (two) times daily as needed.   No current facility-administered medications on file prior to visit.   Medications Discontinued During This Encounter  Medication Reason   predniSONE  (DELTASONE ) 50 MG tablet Completed Course   HYDROcodone -acetaminophen  (NORCO) 10-325 MG tablet Completed Course     Physical Exam:    09/26/2024   11:37 AM 09/26/2024   10:38 AM 09/21/2024   11:12 AM  Vitals with BMI  Height  5' 7   Weight  181 lbs 6 oz   BMI  28.4   Systolic 118 102 847  Diastolic 68 62 78  Pulse  66 67  Vital signs reviewed.  Nursing notes reviewed. Weight trend reviewed. Physical Activity: Insufficiently Active (09/25/2024)   Exercise Vital Sign    Days of Exercise per Week: 2 days    Minutes of Exercise per Session: 20 min   General Appearance:  No acute distress appreciable.   Well-groomed, ill-appearing female.  Well proportioned with no abnormal fat distribution.  Good muscle tone. Pulmonary:  Normal work of breathing at rest, no  respiratory distress apparent.  SpO2: 98 %  Musculoskeletal: All extremities are intact.  Neurological:  Awake, alert, oriented, and engaged.  No obvious focal neurological deficits or cognitive impairments.  Sensorium seems unclouded.   Speech is clear and coherent with logical content. Psychiatric:  Appropriate mood, pleasant and cooperative demeanor, thoughtful and engaged during the exam   Verbalized to patient: Physical Exam HEENT: Right ear canal clogged with cerumen. Left ear canal clear. Nasal congestion present. CHEST: Clear to auscultation bilaterally.   Results:   Verbalized to patient: Results      09/26/2024   10:36 AM 06/26/2024   11:00 AM 02/07/2024    3:01 PM 01/24/2024    8:15 AM  PHQ 2/9 Scores  PHQ - 2 Score 0 0 0 0  PHQ- 9 Score  0  0       Data saved with a previous flowsheet row definition   Office Visit on 09/26/2024  Component Date Value Ref Range Status   Influenza A, POC 09/26/2024 Negative  Negative Final   Influenza B, POC 09/26/2024 Negative  Negative Final  Appointment on 07/11/2024  Component Date Value Ref Range Status   Area-P 1/2 07/11/2024 3.08  cm2 Final   S' Lateral 07/11/2024 3.02  cm Final   P 1/2 time 07/11/2024 782  msec Final   AV Vena cont 07/11/2024 0.28  cm Final   Est EF 07/11/2024 55 - 60%   Final  Orders Only on 07/06/2024  Component Date Value Ref Range Status   Vitamin B-12 07/06/2024 355  180 - 914 pg/mL Final   Folate 07/06/2024 15.1  >5.9 ng/mL Final  Appointment on 07/06/2024  Component Date Value Ref Range Status   Sodium 07/06/2024 139  135 - 145 mmol/L Final   Potassium 07/06/2024 4.2  3.5 - 5.1 mmol/L Final   Chloride 07/06/2024 105  98 - 111 mmol/L Final   CO2 07/06/2024 29  22 - 32 mmol/L Final   Glucose, Bld 07/06/2024 76  70 - 99 mg/dL Final   BUN 89/82/7974 13  8 - 23 mg/dL Final   Creatinine 89/82/7974 0.88  0.44 - 1.00 mg/dL Final   Calcium  07/06/2024 10.2  8.9 - 10.3 mg/dL Final   Total Protein  07/06/2024 7.3  6.5 - 8.1 g/dL Final   Albumin  07/06/2024 4.2  3.5 - 5.0 g/dL Final   AST 89/82/7974 20  15 - 41 U/L Final   ALT 07/06/2024 11  0 - 44 U/L Final   Alkaline Phosphatase 07/06/2024 94  38 - 126 U/L Final   Total Bilirubin 07/06/2024 0.5  0.0 - 1.2 mg/dL Final   GFR, Estimated 07/06/2024 >60  >60 mL/min Final   Anion gap 07/06/2024 5  5 - 15 Final   WBC Count 07/06/2024 3.5 (L)  4.0 - 10.5 K/uL Final   RBC 07/06/2024 4.00  3.87 - 5.11 MIL/uL Final   Hemoglobin 07/06/2024 12.4  12.0 - 15.0 g/dL Final   HCT 89/82/7974 37.1  36.0 - 46.0 % Final   MCV 07/06/2024 92.8  80.0 - 100.0 fL Final   MCH 07/06/2024 31.0  26.0 - 34.0 pg Final   MCHC 07/06/2024 33.4  30.0 - 36.0 g/dL Final   RDW 89/82/7974 12.2  11.5 - 15.5 % Final   Platelet Count 07/06/2024 194  150 - 400 K/uL Final   nRBC 07/06/2024 0.0  0.0 - 0.2 % Final   Neutrophils Relative % 07/06/2024 40  % Final   Neutro Abs 07/06/2024 1.4 (L)  1.7 -  7.7 K/uL Final   Lymphocytes Relative 07/06/2024 40  % Final   Lymphs Abs 07/06/2024 1.4  0.7 - 4.0 K/uL Final   Monocytes Relative 07/06/2024 16  % Final   Monocytes Absolute 07/06/2024 0.6  0.1 - 1.0 K/uL Final   Eosinophils Relative 07/06/2024 3  % Final   Eosinophils Absolute 07/06/2024 0.1  0.0 - 0.5 K/uL Final   Basophils Relative 07/06/2024 1  % Final   Basophils Absolute 07/06/2024 0.0  0.0 - 0.1 K/uL Final   Immature Granulocytes 07/06/2024 0  % Final   Abs Immature Granulocytes 07/06/2024 0.00  0.00 - 0.07 K/uL Final  Office Visit on 06/26/2024  Component Date Value Ref Range Status   Color, Urine 06/26/2024 YELLOW  YELLOW Final   APPearance 06/26/2024 CLEAR  CLEAR Final   Specific Gravity, Urine 06/26/2024 1.012  1.001 - 1.035 Final   pH 06/26/2024 6.5  5.0 - 8.0 Final   Glucose, UA 06/26/2024 NEGATIVE  NEGATIVE Final   Bilirubin Urine 06/26/2024 NEGATIVE  NEGATIVE Final   Ketones, ur 06/26/2024 NEGATIVE  NEGATIVE Final   Hgb urine dipstick 06/26/2024 NEGATIVE   NEGATIVE Final   Protein, ur 06/26/2024 NEGATIVE  NEGATIVE Final   Nitrites, Initial 06/26/2024 NEGATIVE  NEGATIVE Final   Leukocyte Esterase 06/26/2024 NEGATIVE  NEGATIVE Final   WBC, UA 06/26/2024 NONE SEEN  0 - 5 /HPF Final   RBC / HPF 06/26/2024 NONE SEEN  0 - 2 /HPF Final   Squamous Epithelial / HPF 06/26/2024 NONE SEEN  < OR = 5 /HPF Final   Bacteria, UA 06/26/2024 NONE SEEN  NONE SEEN /HPF Final   Hyaline Cast 06/26/2024 NONE SEEN  NONE SEEN /LPF Final   Note 06/26/2024    Final   Reflexve Urine Culture 06/26/2024    Final  Office Visit on 05/01/2024  Component Date Value Ref Range Status   SARS Coronavirus 2 Ag 05/01/2024 Negative  Negative Final   WBC 05/01/2024 4.1  4.0 - 10.5 K/uL Final   RBC 05/01/2024 3.94  3.87 - 5.11 Mil/uL Final   Hemoglobin 05/01/2024 12.3  12.0 - 15.0 g/dL Final   HCT 91/87/7974 36.7  36.0 - 46.0 % Final   MCV 05/01/2024 93.1  78.0 - 100.0 fl Final   MCHC 05/01/2024 33.6  30.0 - 36.0 g/dL Final   RDW 91/87/7974 13.8  11.5 - 15.5 % Final   Platelets 05/01/2024 205.0  150.0 - 400.0 K/uL Final   Neutrophils Relative % 05/01/2024 40.6 (L)  43.0 - 77.0 % Final   Lymphocytes Relative 05/01/2024 36.9  12.0 - 46.0 % Final   Monocytes Relative 05/01/2024 17.8 (H)  3.0 - 12.0 % Final   Eosinophils Relative 05/01/2024 4.1  0.0 - 5.0 % Final   Basophils Relative 05/01/2024 0.6  0.0 - 3.0 % Final   Neutro Abs 05/01/2024 1.7  1.4 - 7.7 K/uL Final   Lymphs Abs 05/01/2024 1.5  0.7 - 4.0 K/uL Final   Monocytes Absolute 05/01/2024 0.7  0.1 - 1.0 K/uL Final   Eosinophils Absolute 05/01/2024 0.2  0.0 - 0.7 K/uL Final   Basophils Absolute 05/01/2024 0.0  0.0 - 0.1 K/uL Final   Sodium 05/01/2024 140  135 - 145 mEq/L Final   Potassium 05/01/2024 3.7  3.5 - 5.1 mEq/L Final   Chloride 05/01/2024 105  96 - 112 mEq/L Final   CO2 05/01/2024 29  19 - 32 mEq/L Final   Glucose, Bld 05/01/2024 86  70 - 99 mg/dL Final  BUN 05/01/2024 11  6 - 23 mg/dL Final   Creatinine, Ser  05/01/2024 0.67  0.40 - 1.20 mg/dL Final   Total Bilirubin 05/01/2024 0.4  0.2 - 1.2 mg/dL Final   Alkaline Phosphatase 05/01/2024 88  39 - 117 U/L Final   AST 05/01/2024 15  0 - 37 U/L Final   ALT 05/01/2024 15  0 - 35 U/L Final   Total Protein 05/01/2024 6.8  6.0 - 8.3 g/dL Final   Albumin  05/01/2024 3.7  3.5 - 5.2 g/dL Final   GFR 91/87/7974 85.63  >60.00 mL/min Final   Calcium  05/01/2024 9.3  8.4 - 10.5 mg/dL Final   Sed Rate 91/87/7974 19  0 - 30 mm/hr Final   CRP 05/01/2024 <1.0  0.5 - 20.0 mg/dL Final   Vitamin A-87 91/87/7974 311  211 - 911 pg/mL Final   Folate 05/01/2024 17.5  >5.9 ng/mL Final   C3 Complement 05/01/2024 181  83 - 193 mg/dL Final   C4 Complement 91/87/7974 29  15 - 57 mg/dL Final   VITD 91/87/7974 28.39 (L)  30.00 - 100.00 ng/mL Final   Iron 05/01/2024 92  45 - 160 mcg/dL Final   TIBC 91/87/7974 373  250 - 450 mcg/dL (calc) Final   %SAT 91/87/7974 25  16 - 45 % (calc) Final   Ferritin 05/01/2024 29  16 - 288 ng/mL Final   Color, Urine 05/01/2024 DARK YELLOW  YELLOW Final   APPearance 05/01/2024 CLEAR  CLEAR Final   Specific Gravity, Urine 05/01/2024 1.022  1.001 - 1.035 Final   pH 05/01/2024 6.5  5.0 - 8.0 Final   Glucose, UA 05/01/2024 NEGATIVE  NEGATIVE Final   Bilirubin Urine 05/01/2024 NEGATIVE  NEGATIVE Final   Ketones, ur 05/01/2024 TRACE (A)  NEGATIVE Final   Hgb urine dipstick 05/01/2024 NEGATIVE  NEGATIVE Final   Protein, ur 05/01/2024 1+ (A)  NEGATIVE Final   Nitrites, Initial 05/01/2024 NEGATIVE  NEGATIVE Final   Leukocyte Esterase 05/01/2024 TRACE (A)  NEGATIVE Final   WBC, UA 05/01/2024 NONE SEEN  0 - 5 /HPF Final   RBC / HPF 05/01/2024 0-2  0 - 2 /HPF Final   Squamous Epithelial / HPF 05/01/2024 0-5  < OR = 5 /HPF Final   Bacteria, UA 05/01/2024 NONE SEEN  NONE SEEN /HPF Final   Hyaline Cast 05/01/2024 NONE SEEN  NONE SEEN /LPF Final   Creatinine, Urine 05/01/2024 200  20 - 275 mg/dL Final   Protein/Creat Ratio 05/01/2024 105  24 - 184 mg/g  creat Final   Protein/Creatinine Ratio 05/01/2024 0.105  0.024 - 0.184 mg/mg creat Final   Total Protein, Urine 05/01/2024 21  5 - 24 mg/dL Final   TSH 91/87/7974 0.705  0.450 - 4.500 uIU/mL Final   Extra Urine Specimen 05/01/2024    Final   Extra tube recieved 05/01/2024    Final   Specimen type recieved 05/01/2024 Serum Separator (SST)   Final   MICRO NUMBER: 05/01/2024 [id number]   Final   SPECIMEN QUALITY: 05/01/2024 Adequate   Final   Sample Source 05/01/2024 URINE   Final   STATUS: 05/01/2024 FINAL   Final   ISOLATE 1: 05/01/2024 Proteus mirabilis (A)   Final   REFLEXIVE URINE CULTURE 05/01/2024    Final  Admission on 04/09/2024, Discharged on 04/09/2024  Component Date Value Ref Range Status   Sodium 04/09/2024 139  135 - 145 mmol/L Final   Potassium 04/09/2024 3.9  3.5 - 5.1 mmol/L Final   Chloride 04/09/2024  103  98 - 111 mmol/L Final   CO2 04/09/2024 24  22 - 32 mmol/L Final   Glucose, Bld 04/09/2024 94  70 - 99 mg/dL Final   BUN 92/78/7974 8  8 - 23 mg/dL Final   Creatinine, Ser 04/09/2024 0.77  0.44 - 1.00 mg/dL Final   Calcium  04/09/2024 9.6  8.9 - 10.3 mg/dL Final   GFR, Estimated 04/09/2024 >60  >60 mL/min Final   Anion gap 04/09/2024 12  5 - 15 Final   WBC 04/09/2024 4.7  4.0 - 10.5 K/uL Final   RBC 04/09/2024 3.80 (L)  3.87 - 5.11 MIL/uL Final   Hemoglobin 04/09/2024 11.9 (L)  12.0 - 15.0 g/dL Final   HCT 92/78/7974 35.6 (L)  36.0 - 46.0 % Final   MCV 04/09/2024 93.7  80.0 - 100.0 fL Final   MCH 04/09/2024 31.3  26.0 - 34.0 pg Final   MCHC 04/09/2024 33.4  30.0 - 36.0 g/dL Final   RDW 92/78/7974 12.9  11.5 - 15.5 % Final   Platelets 04/09/2024 185  150 - 400 K/uL Final   nRBC 04/09/2024 0.0  0.0 - 0.2 % Final   Troponin T High Sensitivity 04/09/2024 <15  <19 ng/L Final   Total Protein 04/09/2024 7.1  6.5 - 8.1 g/dL Final   Albumin  04/09/2024 4.2  3.5 - 5.0 g/dL Final   AST 92/78/7974 28  15 - 41 U/L Final   ALT 04/09/2024 17  0 - 44 U/L Final   Alkaline  Phosphatase 04/09/2024 143 (H)  38 - 126 U/L Final   Total Bilirubin 04/09/2024 0.3  0.0 - 1.2 mg/dL Final   Bilirubin, Direct 04/09/2024 0.1  0.0 - 0.2 mg/dL Final   Indirect Bilirubin 04/09/2024 0.2 (L)  0.3 - 0.9 mg/dL Final   Lipase 92/78/7974 16  11 - 51 U/L Final   SARS Coronavirus 2 by RT PCR 04/09/2024 POSITIVE (A)  NEGATIVE Final   Influenza A by PCR 04/09/2024 NEGATIVE  NEGATIVE Final   Influenza B by PCR 04/09/2024 NEGATIVE  NEGATIVE Final   Resp Syncytial Virus by PCR 04/09/2024 NEGATIVE  NEGATIVE Final  Office Visit on 03/09/2024  Component Date Value Ref Range Status   Creatinine, Urine 03/09/2024 215  20 - 275 mg/dL Final   Protein/Creat Ratio 03/09/2024 88  24 - 184 mg/g creat Final   Protein/Creatinine Ratio 03/09/2024 0.088  0.024 - 0.184 mg/mg creat Final   Total Protein, Urine 03/09/2024 19  5 - 24 mg/dL Final   Color, Urine 93/79/7974 YELLOW  YELLOW Final   APPearance 03/09/2024 CLEAR  CLEAR Final   Specific Gravity, Urine 03/09/2024 1.024  1.001 - 1.035 Final   pH 03/09/2024 6.0  5.0 - 8.0 Final   Glucose, UA 03/09/2024 NEGATIVE  NEGATIVE Final   Bilirubin Urine 03/09/2024 NEGATIVE  NEGATIVE Final   Ketones, ur 03/09/2024 TRACE (A)  NEGATIVE Final   Hgb urine dipstick 03/09/2024 NEGATIVE  NEGATIVE Final   Protein, ur 03/09/2024 1+ (A)  NEGATIVE Final   Nitrites, Initial 03/09/2024 NEGATIVE  NEGATIVE Final   Leukocyte Esterase 03/09/2024 NEGATIVE  NEGATIVE Final   WBC, UA 03/09/2024 0-5  0 - 5 /HPF Final   RBC / HPF 03/09/2024 0-2  0 - 2 /HPF Final   Squamous Epithelial / HPF 03/09/2024 0-5  < OR = 5 /HPF Final   Bacteria, UA 03/09/2024 FEW (A)  NONE SEEN /HPF Final   Hyaline Cast 03/09/2024 NONE SEEN  NONE SEEN /LPF Final   Note  03/09/2024    Final   Reflexve Urine Culture 03/09/2024    Final  Office Visit on 03/07/2024  Component Date Value Ref Range Status   SARS Coronavirus 2 Ag 03/07/2024 Negative  Negative Final   Influenza A, POC 03/07/2024 Negative   Negative Final   Influenza B, POC 03/07/2024 Negative  Negative Final  Office Visit on 02/07/2024  Component Date Value Ref Range Status   Color, UA 02/07/2024 YELLOW   Final   Clarity, UA 02/07/2024 CLEAR   Final   Glucose, UA 02/07/2024 Negative  Negative Final   Bilirubin, UA 02/07/2024 NEG   Final   Ketones, UA 02/07/2024 NEG   Final   Spec Grav, UA 02/07/2024 1.020  1.010 - 1.025 Final   Blood, UA 02/07/2024 NEG   Final   pH, UA 02/07/2024 6.0  5.0 - 8.0 Final   Protein, UA 02/07/2024 Negative  Negative Final   Urobilinogen, UA 02/07/2024 0.2  0.2 or 1.0 E.U./dL Final   Nitrite, UA 94/79/7974 NEG   Final   Leukocytes, UA 02/07/2024 Negative  Negative Final   MICRO NUMBER: 02/07/2024 [id number]   Final   SPECIMEN QUALITY: 02/07/2024 Adequate   Final   Sample Source 02/07/2024 URINE   Final   STATUS: 02/07/2024 FINAL   Final   Result: 02/07/2024 Less than 10,000 CFU/mL of single Gram positive organism isolated. No further testing will be performed. If clinically indicated, recollection using a method to minimize contamination, with prompt transfer to Urine Culture Transport Tube, is recommended.   Final  There may be more visits with results that are not included.  No image results found. IR Radiologist Eval & Mgmt Result Date: 09/24/2024 EXAM: ESTABLISHED PATIENT OFFICE VISIT CHIEF COMPLAINT: See Epic note. HISTORY OF PRESENT ILLNESS: See Epic note. REVIEW OF SYSTEMS: See Epic note. PHYSICAL EXAMINATION: See Epic note. ASSESSMENT AND PLAN: See Epic note. Ester Sides, MD [facility] Electronically Signed   By: Ester Sides M.D.   On: 09/24/2024 09:09   IR EMBO ARTERIAL NOT HEMORR HEMANG INC GUIDE ROADMAPPING Result Date: 08/24/2024 INDICATION: 74 year old female with history of persistent left knee pain after total knee arthroplasty. EXAM: 1. Ultrasound-guided vascular access of the left superficial femoral artery. 2. Limited  left lower extremity angiogram. 3. Selective catheterization and angiography of the articular branch of the left descending geniculate artery. 4. Particle embolization of the articular branch of the left descending genicular artery MEDICATIONS: None. ANESTHESIA/SEDATION: Moderate (conscious) sedation was employed during this procedure. A total of Versed  2 mg and Fentanyl  100 mcg was administered intravenously. Moderate Sedation Time: 22 minutes. The patient's level of consciousness and vital signs were monitored continuously by radiology nursing throughout the procedure under my direct supervision. CONTRAST:  30 mL Isovue  250, intra arterial FLUOROSCOPY: Radiation Exposure Index (as provided by the fluoroscopic device): 86.8 mGy reference air Kerma COMPLICATIONS: None immediate. PROCEDURE: Informed consent was obtained from the patient following explanation of the procedure, risks, benefits and alternatives. The patient understands, agrees and consents for the procedure. All questions were addressed. A time out was performed prior to the initiation of the procedure. Maximal barrier sterile technique utilized including caps, mask, sterile gowns, sterile gloves, large sterile drape, hand hygiene, and Betadine prep. Preprocedure ultrasound evaluation demonstrated patency of the proximal left superficial femoral artery. The procedure was planned. Subdermal Local anesthesia was administered at the planned needle entry site with 1% lidocaine . A small skin nick was made. Deeper local anesthetic was  administered to the level of the proximal superficial femoral artery. Under direct ultrasound visualization, a 21 gauge micropuncture needle was advanced into the superficial femoral artery in antegrade fashion. A permanent ultrasound image was captured and stored in the record. A micropuncture sheath was then introduced through which an Amplatz wire was advanced under fluoroscopic visualization the distal superficial femoral  artery. The micropuncture sheath was exchanged for a 4 French C2 glide catheter which was positioned in the mid to distal superficial femoral artery. Limited angiogram was performed which demonstrated patency of the superficial femoral and popliteal arteries. The descending geniculate artery was identified to supply the medial femoral condyle. Using a 1.9 Fr triple angle Progreat lambda microcatheter, the articular branch of the descending geniculate artery was selected. Dedicated angiogram was performed which demonstrated vascular supply to the medial femoral condyle with evidence of neovascularity and hyperemia. 100 mcg nitroglycerin  was administered at this location followed by embolization with a dilute solution of 250 micron Embozene spheres in 0.2 mL alliquots until relative stasis was achieved. The catheter was retracted slightly and completion angiogram was performed which demonstrated complete elimination of the previously visualized hyperemia with preserved patency of the parent vessels. The catheters were removed. Hemostasis was achieved with manual compression. A sterile bandage was applied. Peripheral pulses were unchanged. The patient tolerated the procedure well and was transferred to the recovery area in good condition. IMPRESSION: Technically successful left geniculate artery embolization. Ester Sides, MD [facility] Radiology Electronically Signed   By: Ester Sides M.D.   On: 08/24/2024 12:07   MM 3D SCREENING MAMMOGRAM BILATERAL BREAST Result Date: 08/07/2024 CLINICAL DATA:  Screening. EXAM: DIGITAL SCREENING BILATERAL MAMMOGRAM WITH TOMOSYNTHESIS AND CAD TECHNIQUE: Bilateral screening digital craniocaudal and mediolateral oblique mammograms were obtained. Bilateral screening digital breast tomosynthesis was performed. The images were evaluated with computer-aided detection. COMPARISON:  Previous exam(s). ACR Breast Density Category b: There  are scattered areas of fibroglandular density. FINDINGS: There are no findings suspicious for malignancy. IMPRESSION: No mammographic evidence of malignancy. A result letter of this screening mammogram will be mailed directly to the patient. RECOMMENDATION: Screening mammogram in one year. (Code:SM-B-01Y) BI-RADS CATEGORY  1: Negative. Electronically Signed   By: Alm Parkins M.D.   On: 08/07/2024 14:25   ECHOCARDIOGRAM COMPLETE Result Date: 07/11/2024    ECHOCARDIOGRAM REPORT   Patient Name:   Suzan M Cada Date of Exam: 07/11/2024 Medical Rec #:  [id number]         Height:       67.0 in Accession #:    [id number]        Weight:       184.9 lb Date of Birth:  07-May-1951         BSA:          1.956 m Patient Age:    73 years          BP:           135/70 mmHg Patient Gender: F                 HR:           60 bpm. Exam Location:  Outpatient Procedure: 2D Echo, 3D Echo, Cardiac Doppler, Color Doppler and Strain Analysis            (Both Spectral and Color Flow Doppler were utilized during            procedure). Indications:    Dyspnea  History:  Patient has prior history of Echocardiogram examinations, most                 recent 07/20/2018. Risk Factors:Hypertension and Non-Smoker.                 LVH; NSVT; History of breast cancer.  Sonographer:    Orvil Holmes RDCS Referring Phys: [id number] CAITLIN S WALKER IMPRESSIONS  1. Left ventricular ejection fraction, by estimation, is 55 to 60%. Left ventricular ejection fraction by 3D volume is 55 %. The left ventricle has normal function. The left ventricle has no regional wall motion abnormalities. Left ventricular diastolic  parameters are consistent with Grade I diastolic dysfunction (impaired relaxation). The average left ventricular global longitudinal strain is -19.5 %. The global longitudinal strain is normal.  2. Right ventricular systolic function is normal. The right ventricular size is normal. There is normal pulmonary artery systolic pressure.  The estimated right ventricular systolic pressure is 31.7 mmHg.  3. The mitral valve is normal in structure. No evidence of mitral valve regurgitation. No evidence of mitral stenosis.  4. The aortic valve is tricuspid. Aortic valve regurgitation is trivial. No aortic stenosis is present.  5. The inferior vena cava is normal in size with greater than 50% respiratory variability, suggesting right atrial pressure of 3 mmHg. FINDINGS  Left Ventricle: Left ventricular ejection fraction, by estimation, is 55 to 60%. Left ventricular ejection fraction by 3D volume is 55 %. The left ventricle has normal function. The left ventricle has no regional wall motion abnormalities. The average left ventricular global longitudinal strain is -19.5 %. Strain was performed and the global longitudinal strain is normal. The left ventricular internal cavity size was normal in size. There is no left ventricular hypertrophy. Left ventricular diastolic parameters are consistent with Grade I diastolic dysfunction (impaired relaxation). Right Ventricle: The right ventricular size is normal. No increase in right ventricular wall thickness. Right ventricular systolic function is normal. There is normal pulmonary artery systolic pressure. The tricuspid regurgitant velocity is 2.68 m/s, and  with an assumed right atrial pressure of 3 mmHg, the estimated right ventricular systolic pressure is 31.7 mmHg. Left Atrium: Left atrial size was normal in size. Right Atrium: Right atrial size was normal in size. Pericardium: There is no evidence of pericardial effusion. Mitral Valve: The mitral valve is normal in structure. No evidence of mitral valve regurgitation. No evidence of mitral valve stenosis. Tricuspid Valve: The tricuspid valve is normal in structure. Tricuspid valve regurgitation is trivial. No evidence of tricuspid stenosis. Aortic Valve: The aortic valve is tricuspid. Aortic valve regurgitation is trivial. Aortic regurgitation PHT measures  782 msec. No aortic stenosis is present. Pulmonic Valve: The pulmonic valve was normal in structure. Pulmonic valve regurgitation is not visualized. No evidence of pulmonic stenosis. Aorta: The aortic root and ascending aorta are structurally normal, with no evidence of dilitation. Venous: The inferior vena cava is normal in size with greater than 50% respiratory variability, suggesting right atrial pressure of 3 mmHg. IAS/Shunts: No atrial level shunt detected by color flow Doppler. Additional Comments: 3D was performed not requiring image post processing on an independent workstation and was normal.  LEFT VENTRICLE PLAX 2D LVIDd:         4.76 cm         Diastology LVIDs:         3.02 cm         LV e' medial:    4.13 cm/s LV PW:  1.00 cm         LV E/e' medial:  14.1 LV IVS:        0.83 cm         LV e' lateral:   5.33 cm/s LVOT diam:     2.00 cm         LV E/e' lateral: 10.9 LVOT Area:     3.14 cm                                2D Longitudinal                                Strain                                2D Strain GLS   -17.0 %                                (A4C):                                2D Strain GLS   -20.6 %                                (A3C):                                2D Strain GLS   -20.9 %                                (A2C):                                2D Strain GLS   -19.5 %                                Avg:                                 3D Volume EF                                LV 3D EF:    Left                                             ventricul                                             ar  ejection                                             fraction                                             by 3D                                             volume is                                             55 %.                                 3D Volume EF:                                3D EF:        55 %                                 LV EDV:       138 ml                                LV ESV:       62 ml                                LV SV:        76 ml RIGHT VENTRICLE RV Basal diam:  4.10 cm     PULMONARY VEINS RV Mid diam:    3.45 cm     A Reversal Velocity: 27.60 cm/s RV S prime:     13.40 cm/s  Diastolic Velocity:  51.30 cm/s TAPSE (M-mode): 2.5 cm      S/D Velocity:        1.50                             Systolic Velocity:   78.90 cm/s LEFT ATRIUM             Index        RIGHT ATRIUM           Index LA diam:        4.00 cm 2.05 cm/m   RA Area:     20.50 cm LA Vol (A2C):   58.2 ml 29.76 ml/m  RA Volume:   54.20 ml  27.71 ml/m LA Vol (A4C):   51.7 ml 26.43 ml/m LA Biplane Vol: 55.1 ml 28.17 ml/m  AORTIC VALVE AI PHT:            782 msec AR Vena Contracta: 0.28  cm  AORTA Ao Root diam: 3.30 cm Ao Asc diam:  3.70 cm MITRAL VALVE               TRICUSPID VALVE MV Area (PHT): 3.08 cm    TR Peak grad:   28.7 mmHg MV Decel Time: 246 msec    TR Vmax:        268.00 cm/s MV E velocity: 58.20 cm/s MV A velocity: 74.40 cm/s  SHUNTS MV E/A ratio:  0.78        Systemic Diam: 2.00 cm Jerel Croitoru MD Electronically signed by Jerel Balding MD Signature Date/Time: 07/11/2024/11:32:45 AM    Final    IR Radiologist Eval & Mgmt Result Date: 07/09/2024 EXAM: NEW PATIENT OFFICE VISIT CHIEF COMPLAINT: See Epic note. HISTORY OF PRESENT ILLNESS: See Epic note. REVIEW OF SYSTEMS: See Epic note. PHYSICAL EXAMINATION: See Epic note. ASSESSMENT AND PLAN: See Epic note. Ester Sides, MD [facility] Radiology Electronically Signed   By: Ester Sides M.D.   On: 07/09/2024 10:25         ASSESSMENT & PLAN   Assessment & Plan Urinary frequency  Acute sinusitis, recurrence not specified, unspecified location Acute viral disease Dehydration Acute viral syndrome   Symptoms align with an acute viral syndrome, presenting with fever, phlegm, chest pain, body aches, and nasal congestion.  Differential diagnosis includes influenza, respiratory syncytial virus, adenovirus, and other viral infections. COVID-19 is unlikely due to negative test results and current prevalence. The systemic nature of symptoms suggests a viral etiology rather than a localized infection. Lungs are clear, and there is no pneumonia. Symptoms are more severe than a common cold but not definitively influenza. Prescribe Tamiflu  despite a negative flu test due to clinical suspicion of influenza. Prescribe Tessalon  for cough management. Advise sinus rinses at bedtime and switch from Nyquil to Robitussin for cough relief. Order blood work to assess electrolytes and kidney function. Recommend supportive management with hydration and rest.  Dehydration   Dehydration is likely due to fever and inadequate fluid intake, contributing to low blood pressure. Symptoms include low energy and body aches, possibly worsened by dehydration. Advise increased fluid intake with electrolytes, such as Gatorade, Pedialyte, broth, or liquid IV. Recommend popsicles for hydration. Instruct to hold or reduce the dose of losartan -hydrochlorothiazide  until symptoms improve. Order blood work to assess electrolyte levels and kidney function.  ORDER ASSOCIATIONS  #   DIAGNOSIS / CONDITION ICD-10 ENCOUNTER ORDER     ICD-10-CM   1. Urinary frequency  R35.0 doxycycline  (VIBRA -TABS) 100 MG tablet    2. Acute viral disease  B34.9 POCT Influenza A/B    3. Acute sinusitis, recurrence not specified, unspecified location  J01.90 doxycycline  (VIBRA -TABS) 100 MG tablet    benzonatate  (TESSALON ) 100 MG capsule    4. Dehydration  E86.0 Urinalysis w microscopic + reflex cultur    CBC with Differential/Platelet    Comp Met (CMET)    CK (Creatine Kinase)         Orders Placed in Encounter:   Lab Orders         Urinalysis w microscopic + reflex cultur         CBC with Differential/Platelet         Comp Met (CMET)         CK (Creatine Kinase)          POCT Influenza A/B     Imaging Orders  No imaging studies ordered today   Referral Orders  No referral(s) requested today   Meds ordered this encounter  Medications   doxycycline  (VIBRA -TABS) 100 MG tablet    Sig: Take 1 tablet (100 mg total) by mouth 2 (two) times daily.    Dispense:  20 tablet    Refill:  0    Only take if symptoms persist greater than 10 days or urinary tract infection (UTI) confirmed   benzonatate  (TESSALON ) 100 MG capsule    Sig: Take 1 capsule (100 mg total) by mouth 2 (two) times daily as needed for cough.    Dispense:  20 capsule    Refill:  0    Orders Placed This Encounter  Procedures   Urinalysis w microscopic + reflex cultur   CBC with Differential/Platelet   Comp Met (CMET)   CK (Creatine Kinase)   POCT Influenza A/B   Medical Decision Making: 1 acute illness with systemic symptoms     Ordering of each unique test; Prescription drug management       This document was synthesized by artificial intelligence (Abridge) using HIPAA-compliant recording of the clinical interaction;   We discussed the use of AI scribe software for clinical note transcription with the patient, who gave verbal consent to proceed. additional Info: This encounter employed state-of-the-art, real-time, collaborative documentation. The patient actively reviewed and assisted in updating their electronic medical record on a shared screen, ensuring transparency and facilitating joint problem-solving for the problem list, overview, and plan. This approach promotes accurate, informed care. The treatment plan was discussed and reviewed in detail, including medication safety, potential side effects, and all patient questions. We confirmed understanding and comfort with the plan. Follow-up instructions were established, including contacting the office for any concerns, returning if symptoms worsen, persist, or new symptoms develop, and precautions for potential emergency department  visits.

## 2024-09-27 LAB — URINALYSIS W MICROSCOPIC + REFLEX CULTURE
Bacteria, UA: NONE SEEN /HPF
Bilirubin Urine: NEGATIVE
Glucose, UA: NEGATIVE
Hgb urine dipstick: NEGATIVE
Hyaline Cast: >60 /LPF — AB
Leukocyte Esterase: NEGATIVE
Nitrites, Initial: NEGATIVE
Specific Gravity, Urine: 1.023 (ref 1.001–1.035)
WBC, UA: NONE SEEN /HPF (ref 0–5)
pH: 6 (ref 5.0–8.0)

## 2024-09-27 LAB — NO CULTURE INDICATED

## 2024-10-04 NOTE — Progress Notes (Signed)
 [name]                                          MRN: [id number]   10/04/2024   The [facility] Quality Team Specialist reviewed this patient medical record for the purposes of chart review for care gap closure. The following were reviewed: chart review for care gap closure-controlling blood pressure.    [facility] Quality Team

## 2024-11-30 ENCOUNTER — Ambulatory Visit (HOSPITAL_BASED_OUTPATIENT_CLINIC_OR_DEPARTMENT_OTHER): Admitting: Family

## 2024-12-26 ENCOUNTER — Ambulatory Visit: Admitting: Internal Medicine

## 2025-01-29 ENCOUNTER — Ambulatory Visit
